# Patient Record
Sex: Female | Born: 1965 | Race: White | Hispanic: Yes | Smoking: Never smoker
Health system: Southern US, Community
[De-identification: ages and names within clinical notes are randomized; demographics above are authoritative.]

## PROBLEM LIST (undated history)

## (undated) ENCOUNTER — Emergency Department: Admission: EM | Payer: Medicare Other | Source: Home / Self Care

## (undated) DIAGNOSIS — M797 Fibromyalgia: Secondary | ICD-10-CM

## (undated) DIAGNOSIS — D649 Anemia, unspecified: Secondary | ICD-10-CM

## (undated) DIAGNOSIS — B019 Varicella without complication: Secondary | ICD-10-CM

## (undated) DIAGNOSIS — G250 Essential tremor: Secondary | ICD-10-CM

## (undated) DIAGNOSIS — F909 Attention-deficit hyperactivity disorder, unspecified type: Secondary | ICD-10-CM

## (undated) DIAGNOSIS — M810 Age-related osteoporosis without current pathological fracture: Secondary | ICD-10-CM

## (undated) DIAGNOSIS — G8929 Other chronic pain: Secondary | ICD-10-CM

## (undated) DIAGNOSIS — Z8719 Personal history of other diseases of the digestive system: Secondary | ICD-10-CM

## (undated) DIAGNOSIS — R51 Headache: Secondary | ICD-10-CM

## (undated) DIAGNOSIS — M899 Disorder of bone, unspecified: Secondary | ICD-10-CM

## (undated) DIAGNOSIS — M1990 Unspecified osteoarthritis, unspecified site: Secondary | ICD-10-CM

## (undated) DIAGNOSIS — N3010 Interstitial cystitis (chronic) without hematuria: Secondary | ICD-10-CM

## (undated) DIAGNOSIS — M549 Dorsalgia, unspecified: Secondary | ICD-10-CM

## (undated) DIAGNOSIS — M949 Disorder of cartilage, unspecified: Secondary | ICD-10-CM

## (undated) DIAGNOSIS — R519 Headache, unspecified: Secondary | ICD-10-CM

## (undated) DIAGNOSIS — M359 Systemic involvement of connective tissue, unspecified: Secondary | ICD-10-CM

## (undated) DIAGNOSIS — N8110 Cystocele, unspecified: Secondary | ICD-10-CM

## (undated) DIAGNOSIS — C50919 Malignant neoplasm of unspecified site of unspecified female breast: Secondary | ICD-10-CM

## (undated) DIAGNOSIS — G20 Parkinson's disease: Secondary | ICD-10-CM

## (undated) DIAGNOSIS — I340 Nonrheumatic mitral (valve) insufficiency: Secondary | ICD-10-CM

## (undated) DIAGNOSIS — M793 Panniculitis, unspecified: Secondary | ICD-10-CM

## (undated) DIAGNOSIS — N318 Other neuromuscular dysfunction of bladder: Secondary | ICD-10-CM

## (undated) DIAGNOSIS — M329 Systemic lupus erythematosus, unspecified: Secondary | ICD-10-CM

## (undated) DIAGNOSIS — H469 Unspecified optic neuritis: Secondary | ICD-10-CM

## (undated) DIAGNOSIS — F329 Major depressive disorder, single episode, unspecified: Secondary | ICD-10-CM

## (undated) DIAGNOSIS — E039 Hypothyroidism, unspecified: Secondary | ICD-10-CM

## (undated) DIAGNOSIS — G43909 Migraine, unspecified, not intractable, without status migrainosus: Secondary | ICD-10-CM

## (undated) DIAGNOSIS — R635 Abnormal weight gain: Secondary | ICD-10-CM

## (undated) DIAGNOSIS — M130 Polyarthritis, unspecified: Secondary | ICD-10-CM

## (undated) DIAGNOSIS — F411 Generalized anxiety disorder: Secondary | ICD-10-CM

## (undated) DIAGNOSIS — R350 Frequency of micturition: Secondary | ICD-10-CM

## (undated) DIAGNOSIS — C539 Malignant neoplasm of cervix uteri, unspecified: Secondary | ICD-10-CM

## (undated) DIAGNOSIS — H409 Unspecified glaucoma: Secondary | ICD-10-CM

## (undated) DIAGNOSIS — K219 Gastro-esophageal reflux disease without esophagitis: Secondary | ICD-10-CM

## (undated) DIAGNOSIS — Z87442 Personal history of urinary calculi: Secondary | ICD-10-CM

## (undated) DIAGNOSIS — IMO0002 Reserved for concepts with insufficient information to code with codable children: Secondary | ICD-10-CM

## (undated) DIAGNOSIS — F429 Obsessive-compulsive disorder, unspecified: Secondary | ICD-10-CM

## (undated) DIAGNOSIS — I776 Arteritis, unspecified: Secondary | ICD-10-CM

## (undated) DIAGNOSIS — G629 Polyneuropathy, unspecified: Secondary | ICD-10-CM

## (undated) DIAGNOSIS — Z780 Asymptomatic menopausal state: Secondary | ICD-10-CM

## (undated) DIAGNOSIS — J45909 Unspecified asthma, uncomplicated: Secondary | ICD-10-CM

## (undated) DIAGNOSIS — I7300 Raynaud's syndrome without gangrene: Secondary | ICD-10-CM

## (undated) DIAGNOSIS — M3500 Sicca syndrome, unspecified: Secondary | ICD-10-CM

## (undated) DIAGNOSIS — K2970 Gastritis, unspecified, without bleeding: Secondary | ICD-10-CM

## (undated) DIAGNOSIS — T7840XA Allergy, unspecified, initial encounter: Secondary | ICD-10-CM

## (undated) HISTORY — DX: Allergy, unspecified, initial encounter: T78.40XA

## (undated) HISTORY — PX: ADENOIDECTOMY: SUR15

## (undated) HISTORY — PX: FRACTURE SURGERY: SHX138

## (undated) HISTORY — DX: Panniculitis, unspecified: M79.3

## (undated) HISTORY — DX: Generalized anxiety disorder: F41.1

## (undated) HISTORY — PX: TONSILLECTOMY: SUR1361

## (undated) HISTORY — DX: Major depressive disorder, single episode, unspecified: F32.9

## (undated) HISTORY — DX: Reserved for concepts with insufficient information to code with codable children: IMO0002

## (undated) HISTORY — DX: Migraine, unspecified, not intractable, without status migrainosus: G43.909

## (undated) HISTORY — DX: Cystocele, unspecified: N81.10

## (undated) HISTORY — DX: Asymptomatic menopausal state: Z78.0

## (undated) HISTORY — DX: Unspecified optic neuritis: H46.9

## (undated) HISTORY — DX: Frequency of micturition: R35.0

## (undated) HISTORY — DX: Attention-deficit hyperactivity disorder, unspecified type: F90.9

## (undated) HISTORY — DX: Interstitial cystitis (chronic) without hematuria: N30.10

## (undated) HISTORY — DX: Hypothyroidism, unspecified: E03.9

## (undated) HISTORY — DX: Obsessive-compulsive disorder, unspecified: F42.9

## (undated) HISTORY — DX: Raynaud's syndrome without gangrene: I73.00

## (undated) HISTORY — DX: Unspecified osteoarthritis, unspecified site: M19.90

## (undated) HISTORY — DX: Unspecified glaucoma: H40.9

## (undated) HISTORY — PX: TONSILLECTOMY: SHX5217

## (undated) HISTORY — DX: Age-related osteoporosis without current pathological fracture: M81.0

## (undated) HISTORY — PX: WRIST SURGERY: SHX841

## (undated) HISTORY — DX: Polyarthritis, unspecified: M13.0

## (undated) HISTORY — PX: OTHER SURGICAL HISTORY: SHX169

## (undated) HISTORY — DX: Dorsalgia, unspecified: M54.9

## (undated) HISTORY — DX: Abnormal weight gain: R63.5

## (undated) HISTORY — PX: APPENDECTOMY: SHX54

## (undated) HISTORY — DX: Other chronic pain: G89.29

## (undated) HISTORY — PX: CHOLECYSTECTOMY: SHX55

## (undated) HISTORY — DX: Gastro-esophageal reflux disease without esophagitis: K21.9

## (undated) HISTORY — DX: Malignant neoplasm of unspecified site of unspecified female breast: C50.919

## (undated) HISTORY — DX: Arteritis, unspecified: I77.6

## (undated) HISTORY — DX: Malignant neoplasm of cervix uteri, unspecified: C53.9

## (undated) HISTORY — PX: JOINT REPLACEMENT: SHX530

## (undated) HISTORY — DX: Varicella without complication: B01.9

## (undated) HISTORY — PX: HAND SURGERY: SHX662

## (undated) HISTORY — DX: Disorder of cartilage, unspecified: M94.9

## (undated) HISTORY — DX: Systemic involvement of connective tissue, unspecified: M35.9

## (undated) HISTORY — DX: Personal history of urinary calculi: Z87.442

## (undated) HISTORY — DX: Disorder of bone, unspecified: M89.9

## (undated) HISTORY — PX: WRIST RECONSTRUCTION: SHX2675

## (undated) HISTORY — DX: Other neuromuscular dysfunction of bladder: N31.8

## (undated) HISTORY — DX: Nonrheumatic mitral (valve) insufficiency: I34.0

## (undated) HISTORY — DX: Sjogren syndrome, unspecified: M35.00

---
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
## 2003-12-01 HISTORY — PX: CERVICAL BIOPSY  W/ LOOP ELECTRODE EXCISION: SUR135

## 2004-11-30 DIAGNOSIS — IMO0002 Reserved for concepts with insufficient information to code with codable children: Secondary | ICD-10-CM

## 2004-11-30 DIAGNOSIS — M329 Systemic lupus erythematosus, unspecified: Secondary | ICD-10-CM

## 2004-11-30 HISTORY — DX: Reserved for concepts with insufficient information to code with codable children: IMO0002

## 2004-11-30 HISTORY — DX: Systemic lupus erythematosus, unspecified: M32.9

## 2006-11-28 ENCOUNTER — Inpatient Hospital Stay: Payer: Self-pay | Admitting: Internal Medicine

## 2007-01-04 ENCOUNTER — Ambulatory Visit: Payer: Self-pay | Admitting: Internal Medicine

## 2007-01-13 ENCOUNTER — Inpatient Hospital Stay: Payer: Self-pay | Admitting: Internal Medicine

## 2007-03-22 ENCOUNTER — Inpatient Hospital Stay (HOSPITAL_COMMUNITY): Admission: AD | Admit: 2007-03-22 | Discharge: 2007-03-27 | Payer: Self-pay | Admitting: Internal Medicine

## 2007-03-22 ENCOUNTER — Ambulatory Visit: Payer: Self-pay | Admitting: Internal Medicine

## 2007-04-07 ENCOUNTER — Ambulatory Visit: Payer: Self-pay | Admitting: Internal Medicine

## 2007-04-08 ENCOUNTER — Ambulatory Visit: Payer: Self-pay | Admitting: Internal Medicine

## 2007-04-18 ENCOUNTER — Encounter: Payer: Self-pay | Admitting: Internal Medicine

## 2007-04-26 ENCOUNTER — Ambulatory Visit: Payer: Self-pay | Admitting: Internal Medicine

## 2007-04-26 LAB — CONVERTED CEMR LAB
AST: 18 units/L (ref 0–37)
Albumin: 3.8 g/dL (ref 3.5–5.2)
Alkaline Phosphatase: 67 units/L (ref 39–117)
BUN: 15 mg/dL (ref 6–23)
Basophils Absolute: 0 10*3/uL (ref 0.0–0.1)
Chloride: 113 meq/L — ABNORMAL HIGH (ref 96–112)
Creatinine, Ser: 0.9 mg/dL (ref 0.4–1.2)
HCT: 38.2 % (ref 36.0–46.0)
MCHC: 33.9 g/dL (ref 30.0–36.0)
Monocytes Relative: 5.8 % (ref 3.0–11.0)
Neutrophils Relative %: 52.8 % (ref 43.0–77.0)
RBC: 3.94 M/uL (ref 3.87–5.11)
RDW: 15.6 % — ABNORMAL HIGH (ref 11.5–14.6)
Total Bilirubin: 0.7 mg/dL (ref 0.3–1.2)

## 2007-05-18 ENCOUNTER — Inpatient Hospital Stay (HOSPITAL_COMMUNITY): Admission: EM | Admit: 2007-05-18 | Discharge: 2007-05-22 | Payer: Self-pay | Admitting: Emergency Medicine

## 2007-05-19 ENCOUNTER — Ambulatory Visit: Payer: Self-pay | Admitting: Internal Medicine

## 2007-05-27 ENCOUNTER — Ambulatory Visit: Payer: Self-pay | Admitting: Internal Medicine

## 2007-06-06 ENCOUNTER — Encounter: Payer: Self-pay | Admitting: Internal Medicine

## 2007-06-06 DIAGNOSIS — G8929 Other chronic pain: Secondary | ICD-10-CM | POA: Insufficient documentation

## 2007-06-06 DIAGNOSIS — F411 Generalized anxiety disorder: Secondary | ICD-10-CM | POA: Insufficient documentation

## 2007-06-06 DIAGNOSIS — E039 Hypothyroidism, unspecified: Secondary | ICD-10-CM

## 2007-06-06 DIAGNOSIS — F329 Major depressive disorder, single episode, unspecified: Secondary | ICD-10-CM

## 2007-06-06 DIAGNOSIS — K219 Gastro-esophageal reflux disease without esophagitis: Secondary | ICD-10-CM | POA: Insufficient documentation

## 2007-06-06 DIAGNOSIS — F3289 Other specified depressive episodes: Secondary | ICD-10-CM

## 2007-06-06 DIAGNOSIS — F419 Anxiety disorder, unspecified: Secondary | ICD-10-CM | POA: Insufficient documentation

## 2007-06-06 DIAGNOSIS — F339 Major depressive disorder, recurrent, unspecified: Secondary | ICD-10-CM | POA: Insufficient documentation

## 2007-06-06 HISTORY — DX: Other chronic pain: G89.29

## 2007-06-06 HISTORY — DX: Gastro-esophageal reflux disease without esophagitis: K21.9

## 2007-06-06 HISTORY — DX: Major depressive disorder, single episode, unspecified: F32.9

## 2007-06-06 HISTORY — DX: Other specified depressive episodes: F32.89

## 2007-06-06 HISTORY — DX: Hypothyroidism, unspecified: E03.9

## 2007-06-06 HISTORY — DX: Generalized anxiety disorder: F41.1

## 2007-06-07 ENCOUNTER — Ambulatory Visit: Payer: Self-pay | Admitting: Internal Medicine

## 2007-06-07 LAB — CONVERTED CEMR LAB
AST: 42 units/L — ABNORMAL HIGH (ref 0–37)
Basophils Absolute: 0 10*3/uL (ref 0.0–0.1)
Bilirubin, Direct: 0.2 mg/dL (ref 0.0–0.3)
Creatinine, Ser: 0.9 mg/dL (ref 0.4–1.2)
Eosinophils Relative: 0 % (ref 0–5)
HCT: 39.8 % (ref 36.0–46.0)
Lymphocytes Relative: 7 % — ABNORMAL LOW (ref 12–46)
Neutro Abs: 5.3 10*3/uL (ref 1.7–7.7)
Platelets: 227 10*3/uL (ref 150–400)
RDW: 16.5 % — ABNORMAL HIGH (ref 11.5–14.0)
Total Bilirubin: 1 mg/dL (ref 0.3–1.2)

## 2007-06-13 ENCOUNTER — Inpatient Hospital Stay (HOSPITAL_COMMUNITY): Admission: EM | Admit: 2007-06-13 | Discharge: 2007-06-15 | Payer: Self-pay | Admitting: Emergency Medicine

## 2007-06-15 ENCOUNTER — Ambulatory Visit: Payer: Self-pay | Admitting: Internal Medicine

## 2007-06-23 ENCOUNTER — Encounter (INDEPENDENT_AMBULATORY_CARE_PROVIDER_SITE_OTHER): Payer: Self-pay

## 2007-06-24 ENCOUNTER — Ambulatory Visit: Payer: Self-pay | Admitting: Internal Medicine

## 2007-07-07 ENCOUNTER — Ambulatory Visit: Payer: Self-pay | Admitting: Family Medicine

## 2007-07-07 DIAGNOSIS — R350 Frequency of micturition: Secondary | ICD-10-CM | POA: Insufficient documentation

## 2007-07-07 HISTORY — DX: Frequency of micturition: R35.0

## 2007-07-07 LAB — CONVERTED CEMR LAB
Blood in Urine, dipstick: NEGATIVE
Glucose, Urine, Semiquant: NEGATIVE
Specific Gravity, Urine: 1.015
pH: 7

## 2007-07-22 ENCOUNTER — Ambulatory Visit: Payer: Self-pay | Admitting: Internal Medicine

## 2007-07-27 ENCOUNTER — Encounter: Payer: Self-pay | Admitting: Internal Medicine

## 2007-07-28 ENCOUNTER — Telehealth: Payer: Self-pay | Admitting: Internal Medicine

## 2007-08-09 ENCOUNTER — Ambulatory Visit: Payer: Self-pay | Admitting: Internal Medicine

## 2007-08-09 ENCOUNTER — Telehealth (INDEPENDENT_AMBULATORY_CARE_PROVIDER_SITE_OTHER): Payer: Self-pay | Admitting: *Deleted

## 2007-08-09 ENCOUNTER — Inpatient Hospital Stay (HOSPITAL_COMMUNITY): Admission: EM | Admit: 2007-08-09 | Discharge: 2007-08-12 | Payer: Self-pay | Admitting: Emergency Medicine

## 2007-08-19 ENCOUNTER — Ambulatory Visit: Payer: Self-pay | Admitting: Internal Medicine

## 2007-08-19 DIAGNOSIS — M130 Polyarthritis, unspecified: Secondary | ICD-10-CM

## 2007-08-19 HISTORY — DX: Polyarthritis, unspecified: M13.0

## 2007-08-26 ENCOUNTER — Telehealth: Payer: Self-pay | Admitting: Internal Medicine

## 2007-09-08 ENCOUNTER — Telehealth: Payer: Self-pay | Admitting: Internal Medicine

## 2007-09-09 ENCOUNTER — Ambulatory Visit: Payer: Self-pay | Admitting: Internal Medicine

## 2007-09-09 LAB — CONVERTED CEMR LAB
ALT: 29 units/L (ref 0–35)
AST: 21 units/L (ref 0–37)
Albumin: 3.5 g/dL (ref 3.5–5.2)
Alkaline Phosphatase: 49 units/L (ref 39–117)
Basophils Relative: 0.8 % (ref 0.0–1.0)
Hemoglobin: 11.9 g/dL — ABNORMAL LOW (ref 12.0–15.0)
Monocytes Absolute: 0.4 10*3/uL (ref 0.2–0.7)
Monocytes Relative: 9.5 % (ref 3.0–11.0)
Platelets: 186 10*3/uL (ref 150–400)
RBC: 3.61 M/uL — ABNORMAL LOW (ref 3.87–5.11)
RDW: 15 % — ABNORMAL HIGH (ref 11.5–14.6)
Total Bilirubin: 0.8 mg/dL (ref 0.3–1.2)

## 2007-09-13 ENCOUNTER — Ambulatory Visit: Payer: Self-pay | Admitting: Internal Medicine

## 2007-09-13 LAB — CONVERTED CEMR LAB
Complement C4, Body Fluid: 14 mg/dL — ABNORMAL LOW (ref 16–47)
Cyclic Citrullin Peptide Ab: 0.1 units (ref <7)

## 2007-09-16 LAB — CONVERTED CEMR LAB: Protein, Ur: 38 mg/24hr — ABNORMAL LOW (ref 50–100)

## 2007-09-20 ENCOUNTER — Ambulatory Visit: Payer: Self-pay | Admitting: Internal Medicine

## 2007-10-03 ENCOUNTER — Ambulatory Visit: Payer: Self-pay | Admitting: Internal Medicine

## 2007-11-08 ENCOUNTER — Ambulatory Visit: Payer: Self-pay | Admitting: Internal Medicine

## 2007-11-08 DIAGNOSIS — H469 Unspecified optic neuritis: Secondary | ICD-10-CM

## 2007-11-08 HISTORY — DX: Unspecified optic neuritis: H46.9

## 2007-11-08 LAB — CONVERTED CEMR LAB
ALT: 34 units/L (ref 0–35)
AST: 29 units/L (ref 0–37)
Albumin: 3.4 g/dL — ABNORMAL LOW (ref 3.5–5.2)
Basophils Relative: 0 % (ref 0.0–1.0)
Bilirubin, Direct: 0.2 mg/dL (ref 0.0–0.3)
HCT: 36.8 % (ref 36.0–46.0)
Hemoglobin: 12.4 g/dL (ref 12.0–15.0)
Monocytes Absolute: 0.4 10*3/uL (ref 0.2–0.7)
Neutrophils Relative %: 62.6 % (ref 43.0–77.0)
RBC: 3.66 M/uL — ABNORMAL LOW (ref 3.87–5.11)
RDW: 15.4 % — ABNORMAL HIGH (ref 11.5–14.6)
Total Bilirubin: 0.7 mg/dL (ref 0.3–1.2)
WBC: 5.3 10*3/uL (ref 4.5–10.5)

## 2007-11-09 ENCOUNTER — Encounter: Payer: Self-pay | Admitting: Internal Medicine

## 2007-11-09 LAB — CONVERTED CEMR LAB: C3 Complement: 70 mg/dL — ABNORMAL LOW (ref 88–201)

## 2007-12-20 ENCOUNTER — Ambulatory Visit: Payer: Self-pay | Admitting: Pain Medicine

## 2007-12-28 ENCOUNTER — Encounter: Payer: Self-pay | Admitting: Internal Medicine

## 2007-12-28 ENCOUNTER — Inpatient Hospital Stay (HOSPITAL_COMMUNITY): Admission: EM | Admit: 2007-12-28 | Discharge: 2007-12-31 | Payer: Self-pay | Admitting: Emergency Medicine

## 2007-12-28 ENCOUNTER — Ambulatory Visit: Payer: Self-pay | Admitting: Internal Medicine

## 2007-12-31 ENCOUNTER — Encounter: Payer: Self-pay | Admitting: Internal Medicine

## 2008-01-26 ENCOUNTER — Telehealth: Payer: Self-pay | Admitting: Internal Medicine

## 2008-01-27 ENCOUNTER — Encounter: Payer: Self-pay | Admitting: Internal Medicine

## 2008-01-31 ENCOUNTER — Encounter: Payer: Self-pay | Admitting: Internal Medicine

## 2008-02-03 ENCOUNTER — Encounter: Payer: Self-pay | Admitting: Internal Medicine

## 2008-02-27 ENCOUNTER — Ambulatory Visit: Payer: Self-pay | Admitting: Internal Medicine

## 2008-02-27 LAB — CONVERTED CEMR LAB
Albumin: 3.4 g/dL — ABNORMAL LOW (ref 3.5–5.2)
Alkaline Phosphatase: 51 units/L (ref 39–117)
Basophils Absolute: 0 10*3/uL (ref 0.0–0.1)
Basophils Relative: 0.3 % (ref 0.0–1.0)
Eosinophils Absolute: 0.2 10*3/uL (ref 0.0–0.7)
Eosinophils Relative: 3 % (ref 0.0–5.0)
HCT: 36.7 % (ref 36.0–46.0)
MCHC: 33 g/dL (ref 30.0–36.0)
MCV: 100 fL (ref 78.0–100.0)
Monocytes Absolute: 0.5 10*3/uL (ref 0.1–1.0)
Neutrophils Relative %: 72.9 % (ref 43.0–77.0)
RBC: 3.66 M/uL — ABNORMAL LOW (ref 3.87–5.11)
Total Protein: 5.7 g/dL — ABNORMAL LOW (ref 6.0–8.3)
WBC: 6.9 10*3/uL (ref 4.5–10.5)

## 2008-02-28 ENCOUNTER — Ambulatory Visit: Payer: Self-pay | Admitting: Obstetrics and Gynecology

## 2008-03-12 ENCOUNTER — Encounter: Payer: Self-pay | Admitting: Internal Medicine

## 2008-03-27 ENCOUNTER — Encounter: Payer: Self-pay | Admitting: Internal Medicine

## 2008-04-14 ENCOUNTER — Emergency Department (HOSPITAL_COMMUNITY): Admission: EM | Admit: 2008-04-14 | Discharge: 2008-04-14 | Payer: Self-pay | Admitting: Emergency Medicine

## 2008-05-24 ENCOUNTER — Ambulatory Visit: Payer: Self-pay | Admitting: Internal Medicine

## 2008-05-24 LAB — CONVERTED CEMR LAB
ALT: 23 units/L (ref 0–35)
Albumin: 3.9 g/dL (ref 3.5–5.2)
BUN: 22 mg/dL (ref 6–23)
Basophils Relative: 2.1 % — ABNORMAL HIGH (ref 0.0–1.0)
Bilirubin, Direct: 0.1 mg/dL (ref 0.0–0.3)
C3 Complement: 123 mg/dL (ref 88–201)
CO2: 26 meq/L (ref 19–32)
Calcium: 9 mg/dL (ref 8.4–10.5)
Creatinine, Ser: 0.9 mg/dL (ref 0.4–1.2)
Eosinophils Relative: 2 % (ref 0.0–5.0)
GFR calc Af Amer: 88 mL/min
Glucose, Bld: 69 mg/dL — ABNORMAL LOW (ref 70–99)
HCT: 39.9 % (ref 36.0–46.0)
Hemoglobin: 13.4 g/dL (ref 12.0–15.0)
Lymphocytes Relative: 29.3 % (ref 12.0–46.0)
Monocytes Absolute: 0.5 10*3/uL (ref 0.1–1.0)
Monocytes Relative: 7.9 % (ref 3.0–12.0)
Neutro Abs: 3.3 10*3/uL (ref 1.4–7.7)
RBC: 4.13 M/uL (ref 3.87–5.11)
Sodium: 140 meq/L (ref 135–145)
Total Protein: 6.5 g/dL (ref 6.0–8.3)
WBC: 5.7 10*3/uL (ref 4.5–10.5)

## 2008-06-04 ENCOUNTER — Telehealth: Payer: Self-pay | Admitting: Internal Medicine

## 2008-08-09 ENCOUNTER — Telehealth: Payer: Self-pay | Admitting: Internal Medicine

## 2008-08-10 ENCOUNTER — Ambulatory Visit: Payer: Self-pay | Admitting: Internal Medicine

## 2008-08-10 LAB — CONVERTED CEMR LAB
Basophils Absolute: 0 10*3/uL (ref 0.0–0.1)
Bilirubin, Direct: 0.1 mg/dL (ref 0.0–0.3)
Calcium: 8.6 mg/dL (ref 8.4–10.5)
GFR calc Af Amer: 101 mL/min
Hemoglobin: 11.9 g/dL — ABNORMAL LOW (ref 12.0–15.0)
Lymphocytes Relative: 40.3 % (ref 12.0–46.0)
MCHC: 33 g/dL (ref 30.0–36.0)
Monocytes Absolute: 0.4 10*3/uL (ref 0.1–1.0)
Neutro Abs: 2.6 10*3/uL (ref 1.4–7.7)
Platelets: 170 10*3/uL (ref 150–400)
Potassium: 3.6 meq/L (ref 3.5–5.1)
RDW: 14.7 % — ABNORMAL HIGH (ref 11.5–14.6)
Sodium: 142 meq/L (ref 135–145)
Total Bilirubin: 0.7 mg/dL (ref 0.3–1.2)

## 2008-09-12 ENCOUNTER — Ambulatory Visit: Payer: Self-pay | Admitting: Internal Medicine

## 2008-09-26 ENCOUNTER — Ambulatory Visit: Payer: Self-pay | Admitting: Internal Medicine

## 2008-09-26 LAB — CONVERTED CEMR LAB
Basophils Absolute: 0 10*3/uL (ref 0.0–0.1)
Basophils Relative: 0.2 % (ref 0.0–3.0)
CRP, High Sensitivity: 3 (ref 0.00–5.00)
Eosinophils Absolute: 0.1 10*3/uL (ref 0.0–0.7)
HCT: 36.3 % (ref 36.0–46.0)
Hemoglobin: 12.3 g/dL (ref 12.0–15.0)
MCHC: 33.9 g/dL (ref 30.0–36.0)
MCV: 94.1 fL (ref 78.0–100.0)
Monocytes Absolute: 0.4 10*3/uL (ref 0.1–1.0)
Neutro Abs: 2.9 10*3/uL (ref 1.4–7.7)
RBC: 3.85 M/uL — ABNORMAL LOW (ref 3.87–5.11)

## 2008-10-01 ENCOUNTER — Ambulatory Visit: Payer: Self-pay | Admitting: Internal Medicine

## 2008-10-01 DIAGNOSIS — M79609 Pain in unspecified limb: Secondary | ICD-10-CM | POA: Insufficient documentation

## 2008-10-30 ENCOUNTER — Ambulatory Visit: Payer: Self-pay | Admitting: Internal Medicine

## 2008-11-01 LAB — CONVERTED CEMR LAB
Creatinine, Ser: 0.9 mg/dL (ref 0.4–1.2)
HCT: 35.5 % — ABNORMAL LOW (ref 36.0–46.0)
Hemoglobin: 12 g/dL (ref 12.0–15.0)
MCHC: 33.8 g/dL (ref 30.0–36.0)
MCV: 92.4 fL (ref 78.0–100.0)
Monocytes Absolute: 0.7 10*3/uL (ref 0.1–1.0)
Monocytes Relative: 9.4 % (ref 3.0–12.0)
Neutro Abs: 3.9 10*3/uL (ref 1.4–7.7)
RDW: 14.6 % (ref 11.5–14.6)

## 2008-12-07 ENCOUNTER — Ambulatory Visit: Payer: Self-pay | Admitting: Internal Medicine

## 2008-12-10 LAB — CONVERTED CEMR LAB
Albumin: 3.9 g/dL (ref 3.5–5.2)
BUN: 22 mg/dL (ref 6–23)
Basophils Relative: 0.1 % (ref 0.0–3.0)
CRP, High Sensitivity: 1 (ref 0.00–5.00)
Calcium: 8.8 mg/dL (ref 8.4–10.5)
Creatinine, Ser: 0.8 mg/dL (ref 0.4–1.2)
Eosinophils Absolute: 0.1 10*3/uL (ref 0.0–0.7)
Eosinophils Relative: 0.7 % (ref 0.0–5.0)
GFR calc Af Amer: 100 mL/min
GFR calc non Af Amer: 83 mL/min
HCT: 35 % — ABNORMAL LOW (ref 36.0–46.0)
MCV: 90.9 fL (ref 78.0–100.0)
Monocytes Absolute: 0.3 10*3/uL (ref 0.1–1.0)
Platelets: 211 10*3/uL (ref 150–400)
WBC: 8.9 10*3/uL (ref 4.5–10.5)

## 2009-01-17 ENCOUNTER — Ambulatory Visit: Payer: Self-pay | Admitting: Internal Medicine

## 2009-01-17 ENCOUNTER — Inpatient Hospital Stay (HOSPITAL_COMMUNITY): Admission: EM | Admit: 2009-01-17 | Discharge: 2009-01-20 | Payer: Self-pay | Admitting: Emergency Medicine

## 2009-02-22 ENCOUNTER — Ambulatory Visit: Payer: Self-pay | Admitting: Internal Medicine

## 2009-02-22 DIAGNOSIS — R635 Abnormal weight gain: Secondary | ICD-10-CM

## 2009-02-22 DIAGNOSIS — M549 Dorsalgia, unspecified: Secondary | ICD-10-CM

## 2009-02-22 HISTORY — DX: Abnormal weight gain: R63.5

## 2009-02-22 HISTORY — DX: Dorsalgia, unspecified: M54.9

## 2009-02-24 ENCOUNTER — Emergency Department: Payer: Self-pay | Admitting: Emergency Medicine

## 2009-03-04 ENCOUNTER — Ambulatory Visit: Payer: Self-pay | Admitting: Obstetrics and Gynecology

## 2009-03-12 ENCOUNTER — Ambulatory Visit: Payer: Self-pay | Admitting: Unknown Physician Specialty

## 2009-03-19 ENCOUNTER — Emergency Department: Payer: Self-pay | Admitting: Emergency Medicine

## 2009-03-26 ENCOUNTER — Ambulatory Visit: Payer: Self-pay | Admitting: Internal Medicine

## 2009-06-18 ENCOUNTER — Encounter: Admission: RE | Admit: 2009-06-18 | Discharge: 2009-06-18 | Payer: Self-pay | Admitting: Orthopedic Surgery

## 2009-06-25 ENCOUNTER — Encounter: Payer: Self-pay | Admitting: Internal Medicine

## 2009-06-25 ENCOUNTER — Ambulatory Visit: Payer: Self-pay | Admitting: Obstetrics and Gynecology

## 2009-06-26 ENCOUNTER — Encounter: Payer: Self-pay | Admitting: Internal Medicine

## 2009-06-26 ENCOUNTER — Emergency Department: Payer: Self-pay | Admitting: Internal Medicine

## 2009-06-27 ENCOUNTER — Ambulatory Visit: Payer: Self-pay | Admitting: Internal Medicine

## 2009-06-27 ENCOUNTER — Encounter: Payer: Self-pay | Admitting: Internal Medicine

## 2009-06-30 HISTORY — PX: REVISION TOTAL HIP ARTHROPLASTY: SHX766

## 2009-07-01 ENCOUNTER — Inpatient Hospital Stay (HOSPITAL_COMMUNITY): Admission: EM | Admit: 2009-07-01 | Discharge: 2009-07-08 | Payer: Self-pay | Admitting: Orthopedic Surgery

## 2009-07-02 ENCOUNTER — Encounter: Payer: Self-pay | Admitting: Internal Medicine

## 2009-07-05 ENCOUNTER — Ambulatory Visit: Payer: Self-pay | Admitting: Physical Medicine & Rehabilitation

## 2009-07-08 ENCOUNTER — Encounter: Payer: Self-pay | Admitting: Internal Medicine

## 2009-08-06 ENCOUNTER — Telehealth: Payer: Self-pay | Admitting: Internal Medicine

## 2009-08-14 ENCOUNTER — Ambulatory Visit: Payer: Self-pay | Admitting: Internal Medicine

## 2009-08-14 ENCOUNTER — Encounter (INDEPENDENT_AMBULATORY_CARE_PROVIDER_SITE_OTHER): Payer: Self-pay

## 2009-08-15 LAB — CONVERTED CEMR LAB
Albumin: 3.3 g/dL — ABNORMAL LOW (ref 3.5–5.2)
Alkaline Phosphatase: 91 units/L (ref 39–117)
Basophils Relative: 0.7 % (ref 0.0–3.0)
Creatinine, Ser: 0.9 mg/dL (ref 0.4–1.2)
Eosinophils Absolute: 0.2 10*3/uL (ref 0.0–0.7)
Lymphocytes Relative: 20.3 % (ref 12.0–46.0)
Lymphs Abs: 1.7 10*3/uL (ref 0.7–4.0)
Neutro Abs: 5.9 10*3/uL (ref 1.4–7.7)
Platelets: 248 10*3/uL (ref 150.0–400.0)
Sed Rate: 10 mm/hr (ref 0–22)
Total Protein: 5.8 g/dL — ABNORMAL LOW (ref 6.0–8.3)
WBC: 8.5 10*3/uL (ref 4.5–10.5)

## 2009-08-26 ENCOUNTER — Encounter: Payer: Self-pay | Admitting: Internal Medicine

## 2009-08-26 ENCOUNTER — Ambulatory Visit: Payer: Self-pay | Admitting: Family Medicine

## 2009-09-26 ENCOUNTER — Encounter: Payer: Self-pay | Admitting: Internal Medicine

## 2009-10-14 ENCOUNTER — Ambulatory Visit: Payer: Self-pay | Admitting: Internal Medicine

## 2009-10-14 DIAGNOSIS — N318 Other neuromuscular dysfunction of bladder: Secondary | ICD-10-CM

## 2009-10-14 DIAGNOSIS — M899 Disorder of bone, unspecified: Secondary | ICD-10-CM | POA: Insufficient documentation

## 2009-10-14 DIAGNOSIS — M949 Disorder of cartilage, unspecified: Secondary | ICD-10-CM

## 2009-10-14 DIAGNOSIS — N3281 Overactive bladder: Secondary | ICD-10-CM | POA: Insufficient documentation

## 2009-10-14 HISTORY — DX: Other neuromuscular dysfunction of bladder: N31.8

## 2009-10-14 HISTORY — DX: Disorder of bone, unspecified: M89.9

## 2009-10-14 LAB — CONVERTED CEMR LAB
Bilirubin Urine: NEGATIVE
Nitrite: NEGATIVE
Specific Gravity, Urine: 1.015
Urobilinogen, UA: 0.2

## 2009-10-15 LAB — CONVERTED CEMR LAB
ALT: 21 units/L (ref 0–35)
BUN: 21 mg/dL (ref 6–23)
Basophils Absolute: 0 10*3/uL (ref 0.0–0.1)
Calcium: 8.7 mg/dL (ref 8.4–10.5)
GFR calc non Af Amer: 82.45 mL/min (ref >60)
Glucose, Bld: 103 mg/dL — ABNORMAL HIGH (ref 70–99)
Lymphocytes Relative: 8.7 % — ABNORMAL LOW (ref 12.0–46.0)
Monocytes Relative: 3.5 % (ref 3.0–12.0)
Platelets: 204 10*3/uL (ref 150.0–400.0)
RDW: 18.9 % — ABNORMAL HIGH (ref 11.5–14.6)
Sed Rate: 7 mm/hr (ref 0–22)
Sodium: 144 meq/L (ref 135–145)

## 2009-10-16 ENCOUNTER — Telehealth: Payer: Self-pay | Admitting: Internal Medicine

## 2009-10-25 ENCOUNTER — Emergency Department: Payer: Self-pay | Admitting: Emergency Medicine

## 2009-11-20 ENCOUNTER — Ambulatory Visit: Payer: Self-pay | Admitting: Orthopedic Surgery

## 2009-12-13 ENCOUNTER — Ambulatory Visit: Payer: Self-pay | Admitting: Internal Medicine

## 2009-12-13 LAB — CONVERTED CEMR LAB
ALT: 19 units/L (ref 0–35)
BUN: 23 mg/dL (ref 6–23)
Basophils Absolute: 0 10*3/uL (ref 0.0–0.1)
Hemoglobin: 13.4 g/dL (ref 12.0–15.0)
Lymphocytes Relative: 15.4 % (ref 12.0–46.0)
Monocytes Relative: 5.1 % (ref 3.0–12.0)
Neutro Abs: 8.8 10*3/uL — ABNORMAL HIGH (ref 1.4–7.7)
Potassium: 3.9 meq/L (ref 3.5–5.3)
RBC: 4.23 M/uL (ref 3.87–5.11)
RDW: 16.4 % — ABNORMAL HIGH (ref 11.5–14.6)
Sed Rate: 10 mm/hr (ref 0–22)
Sodium: 144 meq/L (ref 135–145)

## 2009-12-21 ENCOUNTER — Observation Stay (HOSPITAL_COMMUNITY): Admission: EM | Admit: 2009-12-21 | Discharge: 2009-12-23 | Payer: Self-pay | Admitting: Emergency Medicine

## 2009-12-30 ENCOUNTER — Ambulatory Visit: Payer: Self-pay | Admitting: Internal Medicine

## 2009-12-30 DIAGNOSIS — J069 Acute upper respiratory infection, unspecified: Secondary | ICD-10-CM | POA: Insufficient documentation

## 2010-01-08 ENCOUNTER — Telehealth: Payer: Self-pay | Admitting: Internal Medicine

## 2010-01-31 ENCOUNTER — Ambulatory Visit: Payer: Self-pay | Admitting: Internal Medicine

## 2010-02-05 ENCOUNTER — Ambulatory Visit: Payer: Self-pay | Admitting: Internal Medicine

## 2010-02-05 LAB — CONVERTED CEMR LAB
BUN: 25 mg/dL — ABNORMAL HIGH (ref 6–23)
Basophils Absolute: 0 10*3/uL (ref 0.0–0.1)
Basophils Relative: 0.4 % (ref 0.0–3.0)
CO2: 28 meq/L (ref 19–32)
Calcium: 9.2 mg/dL (ref 8.4–10.5)
Creatinine, Ser: 0.9 mg/dL (ref 0.4–1.2)
Eosinophils Relative: 1.4 % (ref 0.0–5.0)
Glucose, Bld: 71 mg/dL (ref 70–99)
HCT: 41 % (ref 36.0–46.0)
Hemoglobin: 13.6 g/dL (ref 12.0–15.0)
Lymphs Abs: 1.8 10*3/uL (ref 0.7–4.0)
Monocytes Relative: 5.6 % (ref 3.0–12.0)
Neutro Abs: 5.7 10*3/uL (ref 1.4–7.7)
RDW: 15.5 % — ABNORMAL HIGH (ref 11.5–14.6)

## 2010-03-06 ENCOUNTER — Ambulatory Visit: Payer: Self-pay | Admitting: Obstetrics and Gynecology

## 2010-03-14 ENCOUNTER — Ambulatory Visit: Payer: Self-pay | Admitting: Internal Medicine

## 2010-03-14 DIAGNOSIS — K140 Glossitis: Secondary | ICD-10-CM | POA: Insufficient documentation

## 2010-03-14 LAB — CONVERTED CEMR LAB
Albumin: 3.8 g/dL (ref 3.5–5.2)
Alkaline Phosphatase: 66 units/L (ref 39–117)
Basophils Absolute: 0 10*3/uL (ref 0.0–0.1)
Bilirubin, Direct: 0 mg/dL (ref 0.0–0.3)
Calcium: 9.1 mg/dL (ref 8.4–10.5)
Creatinine, Ser: 0.8 mg/dL (ref 0.4–1.2)
GFR calc non Af Amer: 82.3 mL/min (ref >60)
HCT: 40.6 % (ref 36.0–46.0)
Lymphs Abs: 1.4 10*3/uL (ref 0.7–4.0)
MCHC: 33.9 g/dL (ref 30.0–36.0)
MCV: 96.9 fL (ref 78.0–100.0)
Monocytes Absolute: 0.6 10*3/uL (ref 0.1–1.0)
Platelets: 247 10*3/uL (ref 150.0–400.0)
RDW: 15.1 % — ABNORMAL HIGH (ref 11.5–14.6)
Sodium: 142 meq/L (ref 135–145)

## 2010-03-20 ENCOUNTER — Telehealth: Payer: Self-pay | Admitting: Internal Medicine

## 2010-05-15 ENCOUNTER — Telehealth: Payer: Self-pay | Admitting: Internal Medicine

## 2010-06-03 ENCOUNTER — Telehealth: Payer: Self-pay | Admitting: Internal Medicine

## 2010-06-19 ENCOUNTER — Ambulatory Visit: Payer: Self-pay | Admitting: Internal Medicine

## 2010-06-19 LAB — CONVERTED CEMR LAB
ALT: 26 units/L (ref 0–35)
AST: 19 units/L (ref 0–37)
Basophils Relative: 0.2 % (ref 0.0–3.0)
CRP, High Sensitivity: 16.62 — ABNORMAL HIGH (ref 0.00–5.00)
Eosinophils Absolute: 0.1 10*3/uL (ref 0.0–0.7)
Eosinophils Relative: 1.8 % (ref 0.0–5.0)
Hemoglobin: 13.2 g/dL (ref 12.0–15.0)
Lymphocytes Relative: 23.3 % (ref 12.0–46.0)
MCHC: 34 g/dL (ref 30.0–36.0)
Neutro Abs: 5.6 10*3/uL (ref 1.4–7.7)
RBC: 4 M/uL (ref 3.87–5.11)
WBC: 8 10*3/uL (ref 4.5–10.5)

## 2010-07-08 ENCOUNTER — Ambulatory Visit: Payer: Self-pay | Admitting: Family Medicine

## 2010-07-08 LAB — CONVERTED CEMR LAB
ALT: 18 units/L (ref 0–35)
Basophils Relative: 0.3 % (ref 0.0–3.0)
Bilirubin Urine: NEGATIVE
CRP, High Sensitivity: 8.42 — ABNORMAL HIGH (ref 0.00–5.00)
Creatinine, Ser: 0.8 mg/dL (ref 0.4–1.2)
Eosinophils Relative: 2.6 % (ref 0.0–5.0)
Glucose, Urine, Semiquant: NEGATIVE
Ketones, urine, test strip: NEGATIVE
Lymphocytes Relative: 26.5 % (ref 12.0–46.0)
Monocytes Absolute: 0.4 10*3/uL (ref 0.1–1.0)
Monocytes Relative: 6.4 % (ref 3.0–12.0)
Neutrophils Relative %: 64.2 % (ref 43.0–77.0)
Platelets: 214 10*3/uL (ref 150.0–400.0)
RBC: 3.95 M/uL (ref 3.87–5.11)
Specific Gravity, Urine: 1.02
Urobilinogen, UA: 0.2
WBC: 7 10*3/uL (ref 4.5–10.5)
pH: 5

## 2010-07-15 ENCOUNTER — Encounter: Payer: Self-pay | Admitting: Internal Medicine

## 2010-08-06 ENCOUNTER — Ambulatory Visit: Payer: Self-pay | Admitting: Internal Medicine

## 2010-08-06 LAB — CONVERTED CEMR LAB
ALT: 19 units/L (ref 0–35)
Basophils Relative: 0.3 % (ref 0.0–3.0)
Chloride: 110 meq/L (ref 96–112)
Eosinophils Absolute: 0.2 10*3/uL (ref 0.0–0.7)
Eosinophils Relative: 3.5 % (ref 0.0–5.0)
GFR calc non Af Amer: 89.88 mL/min (ref >60)
Lymphocytes Relative: 32.6 % (ref 12.0–46.0)
MCV: 97.8 fL (ref 78.0–100.0)
Monocytes Absolute: 0.5 10*3/uL (ref 0.1–1.0)
Neutrophils Relative %: 56 % (ref 43.0–77.0)
Platelets: 210 10*3/uL (ref 150.0–400.0)
Potassium: 4 meq/L (ref 3.5–5.1)
RBC: 3.91 M/uL (ref 3.87–5.11)
Sodium: 142 meq/L (ref 135–145)
WBC: 6.3 10*3/uL (ref 4.5–10.5)

## 2010-08-13 ENCOUNTER — Telehealth: Payer: Self-pay | Admitting: Internal Medicine

## 2010-08-20 ENCOUNTER — Ambulatory Visit: Payer: Self-pay | Admitting: Internal Medicine

## 2010-08-20 LAB — CONVERTED CEMR LAB
ALT: 20 units/L (ref 0–35)
BUN: 25 mg/dL — ABNORMAL HIGH (ref 6–23)
Basophils Relative: 0.4 % (ref 0.0–3.0)
CO2: 23 meq/L (ref 19–32)
Chloride: 110 meq/L (ref 96–112)
Eosinophils Relative: 3.4 % (ref 0.0–5.0)
Glucose, Bld: 72 mg/dL (ref 70–99)
Hemoglobin: 13.3 g/dL (ref 12.0–15.0)
Lymphocytes Relative: 32.8 % (ref 12.0–46.0)
Monocytes Relative: 6.3 % (ref 3.0–12.0)
Neutro Abs: 3.5 10*3/uL (ref 1.4–7.7)
Neutrophils Relative %: 57.1 % (ref 43.0–77.0)
Potassium: 3.7 meq/L (ref 3.5–5.1)
RBC: 4.05 M/uL (ref 3.87–5.11)
WBC: 6.1 10*3/uL (ref 4.5–10.5)

## 2010-08-21 ENCOUNTER — Emergency Department (HOSPITAL_COMMUNITY): Admission: EM | Admit: 2010-08-21 | Discharge: 2010-08-22 | Payer: Self-pay | Admitting: Emergency Medicine

## 2010-09-03 ENCOUNTER — Ambulatory Visit: Payer: Self-pay | Admitting: Internal Medicine

## 2010-09-03 LAB — CONVERTED CEMR LAB
ALT: 20 units/L (ref 0–35)
BUN: 24 mg/dL — ABNORMAL HIGH (ref 6–23)
Basophils Relative: 0.3 % (ref 0.0–3.0)
Calcium: 9.2 mg/dL (ref 8.4–10.5)
Chloride: 107 meq/L (ref 96–112)
Creatinine, Ser: 1 mg/dL (ref 0.4–1.2)
Eosinophils Absolute: 0.2 10*3/uL (ref 0.0–0.7)
HCT: 40.7 % (ref 36.0–46.0)
Hemoglobin: 13.7 g/dL (ref 12.0–15.0)
Lymphocytes Relative: 25.4 % (ref 12.0–46.0)
Lymphs Abs: 2.1 10*3/uL (ref 0.7–4.0)
MCHC: 33.6 g/dL (ref 30.0–36.0)
MCV: 98.3 fL (ref 78.0–100.0)
Neutro Abs: 5.4 10*3/uL (ref 1.4–7.7)
RBC: 4.14 M/uL (ref 3.87–5.11)

## 2010-09-08 ENCOUNTER — Telehealth: Payer: Self-pay | Admitting: Internal Medicine

## 2010-09-11 ENCOUNTER — Ambulatory Visit: Payer: Self-pay | Admitting: Family Medicine

## 2010-09-11 ENCOUNTER — Telehealth: Payer: Self-pay | Admitting: Internal Medicine

## 2010-09-11 DIAGNOSIS — N3010 Interstitial cystitis (chronic) without hematuria: Secondary | ICD-10-CM

## 2010-09-11 HISTORY — DX: Interstitial cystitis (chronic) without hematuria: N30.10

## 2010-09-11 LAB — CONVERTED CEMR LAB
Blood in Urine, dipstick: NEGATIVE
Ketones, urine, test strip: NEGATIVE
Nitrite: NEGATIVE
Urobilinogen, UA: 0.2

## 2010-09-24 ENCOUNTER — Encounter: Payer: Self-pay | Admitting: Internal Medicine

## 2010-11-05 ENCOUNTER — Ambulatory Visit: Payer: Self-pay | Admitting: Internal Medicine

## 2010-11-05 ENCOUNTER — Encounter: Payer: Self-pay | Admitting: Internal Medicine

## 2010-11-05 DIAGNOSIS — Z87442 Personal history of urinary calculi: Secondary | ICD-10-CM

## 2010-11-05 HISTORY — DX: Personal history of urinary calculi: Z87.442

## 2010-11-06 LAB — CONVERTED CEMR LAB
Basophils Relative: 0.4 % (ref 0.0–3.0)
Eosinophils Absolute: 0.2 10*3/uL (ref 0.0–0.7)
Hemoglobin: 13 g/dL (ref 12.0–15.0)
Lymphs Abs: 2.2 10*3/uL (ref 0.7–4.0)
MCHC: 34 g/dL (ref 30.0–36.0)
MCV: 97.6 fL (ref 78.0–100.0)
Monocytes Absolute: 0.5 10*3/uL (ref 0.1–1.0)
Neutro Abs: 4.3 10*3/uL (ref 1.4–7.7)
RBC: 3.93 M/uL (ref 3.87–5.11)

## 2010-11-20 ENCOUNTER — Telehealth: Payer: Self-pay | Admitting: Internal Medicine

## 2010-12-22 ENCOUNTER — Encounter: Payer: Self-pay | Admitting: Orthopedic Surgery

## 2010-12-30 NOTE — Progress Notes (Signed)
Summary: referral to urologist  Phone Note Call from Patient Call back at [phone]   Caller: Patient Call For: [name] Savers  MD Summary of Call: pt saw dr todd today dx with interstitial cystitis. Pt has ov with [facility] urologist dr Evelene Croon on 09-18-2010. please fax all pertinent  office note to [phone] their phone [phone]. Can we do referral? if so see order to [facility] Initial call taken by: Heron Sabins,  September 11, 2010 2:43 PM  Follow-up for Phone Call        ok Follow-up by: [name] Savers  MD,  September 11, 2010 5:13 PM  New Problems: INTERSTITIAL CYSTITIS (ICD-595.1)   New Problems: INTERSTITIAL CYSTITIS (ICD-595.1)

## 2010-12-30 NOTE — Progress Notes (Signed)
Summary: med request  Phone Note Call from Patient   Caller: Patient Call For: Gordy Savers  MD Summary of Call: Pt would like the Vesicare 10 mg. one by mouth daily.  Does not want the Enablex, and wants name brand Vesicar. [phone]  [facility]) Initial call taken by: Lynann Beaver CMA,  August 13, 2010 9:30 AM  Follow-up for Phone Call        10 mg  #90  RF 6 Follow-up by: Gordy Savers  MD,  August 13, 2010 10:09 AM    New/Updated Medications: VESICARE 10 MG TABS (SOLIFENACIN SUCCINATE) one by mouth daily Prescriptions: VESICARE 10 MG TABS (SOLIFENACIN SUCCINATE) one by mouth daily  #90 x 6   Entered by:   Lynann Beaver CMA   Authorized by:   Gordy Savers  MD   Signed by:   Lynann Beaver CMA on 08/13/2010   Method used:   Electronically to        [facility]       [address]       [place], [place]  [address]       Ph: [phone]       Fax: [phone]   RxID:   [id number]

## 2010-12-30 NOTE — Assessment & Plan Note (Signed)
Summary: UTI//SLM   Vital Signs:  Patient profile:   45 year old female Weight:      213 pounds O2 Sat:      97 % Temp:     98.3 degrees F oral Pulse rate:   86 / minute Pulse rhythm:   regular Resp:     12 per minute BP sitting:   100 / 88  Vitals Entered By: Lynann Beaver CMA (September 11, 2010 12:08 PM) CC: ? UTI Is Patient Diabetic? No Pain Assessment Patient in pain? yes        CC:  ? UTI.  History of Present Illness: [name] [name] is a 45 year old female patient of Dr. Kirtland Bouchard. comes in with a 3 day history of frequency and dysuria.  She has a history of overactive bladder, and she is on desiccated 10 mg daily.  She is also on numerous other medications.  She's had no fever, chills, nausea, vomiting, diarrhea.  Her last urinary tract infection was over two years ago.  Current Medications (verified): 1)  Percocet 10-650 Mg  Tabs (Oxycodone-Acetaminophen) .... As Needed 2)  Levothyroxine Sodium 25 Mcg  Tabs (Levothyroxine Sodium) .Marland Kitchen.. 1 Once Daily 3)  Protonix 40 Mg  Tbec (Pantoprazole Sodium) .... Take 1 Tablet By Mouth Two Times A Day 4)  Topamax 100 Mg  Tabs (Topiramate) .Marland Kitchen.. 1 Two Times A Day 5)  Proventil Hfa 108 (90 Base) Mcg/act  Aers (Albuterol Sulfate) .... 2 Puffs Q4h As Needed For Wheeze 6)  Lorazepam 1 Mg  Tabs (Lorazepam) .Marland Kitchen.. 1 By Mouth in Am and At Noon 2 By Mouth Qhs 7)  Diclofenac Sodium 75 Mg  Tbec (Diclofenac Sodium) .Marland Kitchen.. 1 Two Times A Day 8)  Cymbalta 60 Mg  Cpep (Duloxetine Hcl) .Marland Kitchen.. 1 Two Times A Day 9)  Fentanyl 25 Mcg/hr  Pt72 (Fentanyl) .... One Every 3 Days 10)  Valtrex 500 Mg  Tabs (Valacyclovir Hcl) .Marland Kitchen.. 1 Once Daily 11)  Metanx 2.8-25-2 Mg  Tabs (L-Methylfolate-B6-B12) .Marland Kitchen.. 1 Once Daily 12)  Methotrexate Sodium 25 Mg/ml  Soln (Methotrexate Sodium) .... .09  Ml Q Week Subcutaneously 13)  Ventolin Hfa 108 (90 Base) Mcg/act  Aers (Albuterol Sulfate) .... 2 Puffs Every 4 Hours As Needed For Wheezing 14)  Topamax 50 Mg Tabs (Topiramate) .... One Daily At  Bedtime 15)  Fentanyl 50 Mcg/hr Pt72 (Fentanyl) .... One Every 3 Days 16)  Abilify 10 Mg Tabs (Aripiprazole) .Marland Kitchen.. 1 Once Daily 17)  Ultram 50 Mg Tabs (Tramadol Hcl) .Marland Kitchen.. 1 Q6h As Needed 18)  Forteo 600 Mcg/2.67ml Soln (Teriparatide (Recombinant)) .... 20 Micrograms Subcutaneously Qam 19)  Iron 325 (65 Fe) Mg Tabs (Ferrous Sulfate) .Marland Kitchen.. 1 Two Times A Day 20)  Aspir-Low 81 Mg Tbec (Aspirin) .Marland Kitchen.. 1 Once Daily 21)  Vitamin D (Ergocalciferol) 50000 Unit Caps (Ergocalciferol) .Marland Kitchen.. 1 Q Week 22)  Benzonatate 100 Mg Caps (Benzonatate) .... One Every 8 Hours As Needed For Cough 23)  Prednisone 5 Mg Tabs (Prednisone) .... Two Every Morning 24)  Silenor 6 Mg Tabs (Doxepin Hcl) .... At Bedtime 25)  Methocarbamol 500 Mg Tabs (Methocarbamol) .... Take 1 Tab By Mouth Three Times A Day 26)  Imuran 50 Mg Tabs (Azathioprine) .... One Daily At Bedtime 27)  Vesicare 10 Mg Tabs (Solifenacin Succinate) .... One By Mouth Daily  Allergies (verified): 1)  ! Penicillin G Potassium (Penicillin G Potassium) 2)  ! Biaxin (Clarithromycin) 3)  ! Phenergan 4)  ! Dhea (Nutritional Supplements)  Past History:  Past medical, surgical,  family and social histories (including risk factors) reviewed for relevance to current acute and chronic problems.  Past Medical History: Reviewed history from 08/06/2010 and no changes required. Anxiety Depression GERD Hypothyroidism Chronic pain Polyarthritis/ possible lupus variant Raynaud's optic neuritis overactive bladder  Past Surgical History: Reviewed history from 06/06/2007 and no changes required. Appendectomy Tonsillectomy  Family History: Reviewed history from 10/03/2007 and no changes required. father's health unknown mother at 68, is in good health one sister with asthma  Social History: Reviewed history from 10/03/2007 and no changes required. 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   Problems:  Medical Problems Added: 1)  Dx of Frequency, Urinary  (ICD-788.41)  Impression & Recommendations:  Problem # 1:  FREQUENCY, URINARY (ICD-788.41) Assessment New  Her updated medication list for this problem includes:    Vesicare 10 Mg Tabs (Solifenacin succinate) ..... One by mouth daily  Complete Medication List: 1)  Percocet 10-650 Mg Tabs (Oxycodone-acetaminophen) .... As needed 2)  Levothyroxine Sodium 25 Mcg Tabs (Levothyroxine sodium) .Marland Kitchen.. 1 once daily 3)  Protonix 40 Mg Tbec (Pantoprazole sodium) .... Take 1 tablet by mouth two times a day 4)  Topamax 100 Mg Tabs (Topiramate) .Marland Kitchen.. 1 two times a day 5)  Proventil Hfa 108 (90 Base) Mcg/act Aers (Albuterol sulfate) .... 2 puffs q4h as needed for wheeze 6)  Lorazepam 1 Mg Tabs (Lorazepam) .Marland Kitchen.. 1 by mouth in am and at noon 2 by mouth qhs 7)  Diclofenac Sodium 75 Mg Tbec (Diclofenac sodium) .Marland Kitchen.. 1 two times a day 8)  Cymbalta 60 Mg Cpep (Duloxetine hcl) .Marland Kitchen.. 1 two times a day 9)  Fentanyl 25 Mcg/hr Pt72 (Fentanyl) .... One every 3 days 10)  Valtrex 500 Mg Tabs (Valacyclovir hcl) .Marland Kitchen.. 1 once daily 11)  Metanx 2.8-25-2 Mg Tabs (L-methylfolate-b6-b12) .Marland Kitchen.. 1 once daily 12)  Methotrexate Sodium 25 Mg/ml Soln (Methotrexate sodium) .... .09  ml q week subcutaneously 13)  Ventolin Hfa 108 (90 Base) Mcg/act Aers (Albuterol sulfate) .... 2 puffs every 4 hours as needed for wheezing 14)  Topamax 50 Mg Tabs (Topiramate) .... One daily at bedtime 15)  Fentanyl 50 Mcg/hr Pt72 (Fentanyl) .... One every 3 days 16)  Abilify 10 Mg Tabs (Aripiprazole) .Marland Kitchen.. 1 once daily 17)  Ultram 50 Mg Tabs (Tramadol hcl) .Marland Kitchen.. 1 q6h as needed 18)  Forteo 600 Mcg/2.37ml Soln (Teriparatide  (recombinant)) .... 20 micrograms subcutaneously qam 19)  Iron 325 (65 Fe) Mg Tabs (Ferrous sulfate) .Marland Kitchen.. 1 two times a day 20)  Aspir-low 81 Mg Tbec (Aspirin) .Marland Kitchen.. 1 once daily 21)  Vitamin D (ergocalciferol) 50000 Unit Caps (Ergocalciferol) .Marland Kitchen.. 1 q week 22)  Benzonatate 100 Mg Caps (Benzonatate) .... One every 8 hours as needed for cough 23)  Prednisone 5 Mg Tabs (Prednisone) .... Two every morning 24)  Silenor 6 Mg Tabs (Doxepin hcl) .... At bedtime 25)  Methocarbamol 500 Mg Tabs (Methocarbamol) .... Take 1 tab by mouth three times a day 26)  Imuran 50 Mg Tabs (Azathioprine) .... One daily at bedtime 27)  Vesicare 10 Mg Tabs (Solifenacin succinate) .... One by mouth daily  Other Orders: UA Dipstick w/o Micro (automated)  (81003)  Patient Instructions: 1)  this since she  lives in [place] next to the hospital.  I would contact the urology group.  There and see if somebody can see you ASAP for consult.  Your urinalysis here is normal  Laboratory Results   Urine Tests  Date/Time Recieved: September 11, 2010 12:14 PM  Date/Time Reported: September 11, 2010 12:14 PM   Routine Urinalysis   Color: yellow Appearance: Clear Glucose: negative   (Normal Range: Negative) Bilirubin: negative   (Normal Range: Negative) Ketone: negative   (Normal Range: Negative) Spec. Gravity: 1.020   (Normal Range: 1.003-1.035) Blood: negative   (Normal Range: Negative) pH: 7.0   (Normal Range: 5.0-8.0) Protein: negative   (Normal Range: Negative) Urobilinogen: 0.2   (Normal Range: 0-1) Nitrite: negative   (Normal Range: Negative) Leukocyte Esterace: trace   (Normal Range: Negative)    Comments: [name] [name], CMA  September 11, 2010 12:15 PM

## 2010-12-30 NOTE — Assessment & Plan Note (Signed)
Summary: 1 month rov/njr/PT RESCD//CCM   Vital Signs:  Patient profile:   45 year old female Weight:      210 pounds Temp:     98.2 degrees F oral BP sitting:   110 / 68  (left arm) Cuff size:   regular  Vitals Entered By: Duard Brady LPN (August 06, 2010 10:22 AM) CC: f/u urinary incont. - med not working   CC:  f/u urinary incont. - med not working.  History of Present Illness: 45 year old patient who is seen today for follow-up.  She is followed by rheumatology in [facility] and is scheduled for [facility]ology follow-up next month.  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  She remains on chronic narcotics, and aggressive immunotherapy.  She has hypothyroidism, history of depression.  Her chief complaint today is over active bladder.  Gala Murdoch has not been helpful.  Follow-up laboratory studies are required.  Allergies: 1)  ! Penicillin G Potassium (Penicillin G Potassium) 2)  ! Biaxin (Clarithromycin) 3)  ! Phenergan 4)  ! 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 Appendectomy Tonsillectomy  Review of Systems       The patient complains of anorexia, weight gain, incontinence, muscle weakness, difficulty walking, depression, and unusual weight change.  The patient denies fever, weight loss, vision loss, decreased hearing, hoarseness, chest pain, syncope, dyspnea on exertion, peripheral edema, prolonged cough, headaches, hemoptysis, abdominal pain, melena, hematochezia, severe indigestion/heartburn, hematuria, genital sores, suspicious skin lesions, transient blindness, abnormal bleeding, enlarged lymph nodes, angioedema, and breast masses.    Physical Exam  General:   overweight-appearing.  normal blood pressure appears comfortable and in no distress Head:  Normocephalic and atraumatic without obvious abnormalities. No apparent alopecia or balding. Eyes:  No corneal or conjunctival inflammation noted. EOMI. Perrla. Funduscopic exam benign, without hemorrhages, exudates or papilledema. Vision grossly normal. Mouth:  Oral mucosa and oropharynx without lesions or exudates.  Teeth in good repair. no oral lesions Neck:  No deformities, masses, or tenderness noted. Lungs:  Normal respiratory effort, chest expands symmetrically. Lungs are clear to auscultation, no crackles or wheezes. Heart:  Normal rate and regular rhythm. S1 and S2 normal without gallop, murmur, click, rub or other extra sounds. Abdomen:  Bowel sounds positive,abdomen soft and non-tender without masses, organomegaly or hernias noted. Msk:  No deformity or scoliosis noted of thoracic or lumbar spine.   Pulses:  R and L carotid,radial,femoral,dorsalis pedis and posterior tibial pulses are full and equal bilaterally Skin:  Intact without suspicious lesions or rashes Cervical Nodes:  No lymphadenopathy noted Psych:  Oriented X3 and memory intact for recent and remote.     Impression & Recommendations:  Problem # 1:  OVERACTIVE BLADDER (ICD-596.51)  Problem # 2:  POLYARTHRITIS (ICD-716.59)  Orders: Venipuncture ([address]) TLB-CBC Platelet - w/Differential (85025-CBCD) TLB-BMP (Basic Metabolic Panel-BMET) (80048-METABOL) TLB-ALT (SGPT) (84460-ALT) TLB-Sedimentation Rate (ESR) (85652-ESR) Specimen Handling (98119)  Problem # 3:  PAIN, CHRONIC NEC (ICD-338.29)  Problem # 4:  HYPOTHYROIDISM (ICD-244.9)  Her updated medication list for this problem includes:    Levothyroxine Sodium 25 Mcg Tabs (Levothyroxine sodium) .Marland Kitchen... 1 once daily  Complete Medication List: 1)  Percocet 10-650 Mg Tabs (Oxycodone-acetaminophen) .... As needed 2)  Levothyroxine Sodium 25 Mcg Tabs (Levothyroxine sodium) .Marland Kitchen..  1 once daily  3)  Protonix 40 Mg Tbec (Pantoprazole sodium) .... Take 1 tablet by mouth two times a day 4)  Topamax 100 Mg Tabs (Topiramate) .Marland Kitchen.. 1 two times a day 5)  Proventil Hfa 108 (90 Base) Mcg/act Aers (Albuterol sulfate) .... 2 puffs q4h as needed for wheeze 6)  Lorazepam 1 Mg Tabs (Lorazepam) .Marland Kitchen.. 1 by mouth in am and at noon 2 by mouth qhs 7)  Diclofenac Sodium 75 Mg Tbec (Diclofenac sodium) .Marland Kitchen.. 1 two times a day 8)  Cymbalta 60 Mg Cpep (Duloxetine hcl) .Marland Kitchen.. 1 two times a day 9)  Fentanyl 25 Mcg/hr Pt72 (Fentanyl) .... One every 3 days 10)  Valtrex 500 Mg Tabs (Valacyclovir hcl) .Marland Kitchen.. 1 once daily 11)  Metanx 2.8-25-2 Mg Tabs (L-methylfolate-b6-b12) .Marland Kitchen.. 1 once daily 12)  Methotrexate Sodium 25 Mg/ml Soln (Methotrexate sodium) .... .09  ml q week subcutaneously 13)  Ventolin Hfa 108 (90 Base) Mcg/act Aers (Albuterol sulfate) .... 2 puffs every 4 hours as needed for wheezing 14)  Topamax 50 Mg Tabs (Topiramate) .... One daily at bedtime 15)  Fentanyl 50 Mcg/hr Pt72 (Fentanyl) .... One every 3 days 16)  Abilify 10 Mg Tabs (Aripiprazole) .Marland Kitchen.. 1 once daily 17)  Ultram 50 Mg Tabs (Tramadol hcl) .Marland Kitchen.. 1 q6h as needed 18)  Forteo 600 Mcg/2.49ml Soln (Teriparatide (recombinant)) .... 20 micrograms subcutaneously qam 19)  Iron 325 (65 Fe) Mg Tabs (Ferrous sulfate) .Marland Kitchen.. 1 two times a day 20)  Aspir-low 81 Mg Tbec (Aspirin) .Marland Kitchen.. 1 once daily 21)  Vitamin D (ergocalciferol) 50000 Unit Caps (Ergocalciferol) .Marland Kitchen.. 1 q week 22)  Benzonatate 100 Mg Caps (Benzonatate) .... One every 8 hours as needed for cough 23)  Prednisone 5 Mg Tabs (Prednisone) .... Two every morning 24)  Silenor 6 Mg Tabs (Doxepin hcl) .... At bedtime 25)  Methocarbamol 500 Mg Tabs (Methocarbamol) .... Take 1 tab by mouth three times a day 26)  Enablex 15 Mg Xr24h-tab (Darifenacin hydrobromide) .... One daily 27)  Imuran 50 Mg Tabs (Azathioprine) .... One daily at bedtime  Patient Instructions: 1)  Please schedule a follow-up  appointment in 3 months. 2)  Limit your Sodium (Salt) to less than 2 grams a day(slightly less than 1/2 a teaspoon) to prevent fluid retention, swelling, or worsening of symptoms. 3)  It is important that you exercise regularly at least 20 minutes 5 times a week. If you develop chest pain, have severe difficulty breathing, or feel very tired , stop exercising immediately and seek medical attention. 4)  f/u [facility] as scheduled Prescriptions: ENABLEX 15 MG XR24H-TAB (DARIFENACIN HYDROBROMIDE) one daily  #90 x 6   Entered and Authorized by:   Gordy Savers  MD   Signed by:   Gordy Savers  MD on 08/06/2010   Method used:   Electronically to        [facility]       [address]       [place], [place]  [address]       Ph: [phone]       Fax: [phone]   RxID:   [id number]   Appended Document: 1 month rov/njr/PT RESCD//CCM  Flu Vaccine Consent Questions     Do you have a history of severe allergic reactions to this vaccine? no    Any prior history of allergic reactions to egg and/or gelatin? no    Do you have a sensitivity to the preservative Thimersol?  no    Do you have a past history of Guillan-Barre Syndrome? no    Do you currently have an acute febrile illness? no    Have you ever had a severe reaction to latex? no    Vaccine information given and explained to patient? yes    Are you currently pregnant? no    Lot Number:[id number]   Exp Date:05/30/2011   Site Given  Left Deltoid IM    Allergies: 1)  ! Penicillin G Potassium (Penicillin G Potassium) 2)  ! Biaxin (Clarithromycin) 3)  ! Phenergan 4)  ! Dhea (Nutritional Supplements)   Complete Medication List: 1)  Percocet 10-650 Mg Tabs (Oxycodone-acetaminophen) .... As needed 2)  Levothyroxine Sodium 25 Mcg Tabs (Levothyroxine sodium) .Marland Kitchen.. 1 once daily 3)  Protonix 40 Mg Tbec (Pantoprazole sodium) .... Take 1 tablet by mouth two times a  day 4)  Topamax 100 Mg Tabs (Topiramate) .Marland Kitchen.. 1 two times a day 5)  Proventil Hfa 108 (90 Base) Mcg/act Aers (Albuterol sulfate) .... 2 puffs q4h as needed for wheeze 6)  Lorazepam 1 Mg Tabs (Lorazepam) .Marland Kitchen.. 1 by mouth in am and at noon 2 by mouth qhs 7)  Diclofenac Sodium 75 Mg Tbec (Diclofenac sodium) .Marland Kitchen.. 1 two times a day 8)  Cymbalta 60 Mg Cpep (Duloxetine hcl) .Marland Kitchen.. 1 two times a day 9)  Fentanyl 25 Mcg/hr Pt72 (Fentanyl) .... One every 3 days 10)  Valtrex 500 Mg Tabs (Valacyclovir hcl) .Marland Kitchen.. 1 once daily 11)  Metanx 2.8-25-2 Mg Tabs (L-methylfolate-b6-b12) .Marland Kitchen.. 1 once daily 12)  Methotrexate Sodium 25 Mg/ml Soln (Methotrexate sodium) .... .09  ml q week subcutaneously 13)  Ventolin Hfa 108 (90 Base) Mcg/act Aers (Albuterol sulfate) .... 2 puffs every 4 hours as needed for wheezing 14)  Topamax 50 Mg Tabs (Topiramate) .... One daily at bedtime 15)  Fentanyl 50 Mcg/hr Pt72 (Fentanyl) .... One every 3 days 16)  Abilify 10 Mg Tabs (Aripiprazole) .Marland Kitchen.. 1 once daily 17)  Ultram 50 Mg Tabs (Tramadol hcl) .Marland Kitchen.. 1 q6h as needed 18)  Forteo 600 Mcg/2.64ml Soln (Teriparatide (recombinant)) .... 20 micrograms subcutaneously qam 19)  Iron 325 (65 Fe) Mg Tabs (Ferrous sulfate) .Marland Kitchen.. 1 two times a day 20)  Aspir-low 81 Mg Tbec (Aspirin) .Marland Kitchen.. 1 once daily 21)  Vitamin D (ergocalciferol) 50000 Unit Caps (Ergocalciferol) .Marland Kitchen.. 1 q week 22)  Benzonatate 100 Mg Caps (Benzonatate) .... One every 8 hours as needed for cough 23)  Prednisone 5 Mg Tabs (Prednisone) .... Two every morning 24)  Silenor 6 Mg Tabs (Doxepin hcl) .... At bedtime 25)  Methocarbamol 500 Mg Tabs (Methocarbamol) .... Take 1 tab by mouth three times a day 26)  Enablex 15 Mg Xr24h-tab (Darifenacin hydrobromide) .... One daily 27)  Imuran 50 Mg Tabs (Azathioprine) .... One daily at bedtime  Other Orders: Admin 1st Vaccine (16109) Flu Vaccine 45yrs + [phone])

## 2010-12-30 NOTE — Assessment & Plan Note (Signed)
Summary: 3 month fup//ccm   Vital Signs:  Patient profile:   45 year old female Weight:      211 pounds Temp:     98.3 degrees F BP sitting:   104 / 72  (right arm) Cuff size:   regular  Vitals Entered By: Duard Brady LPN (November 05, 2010 10:51 AM) CC: 3 mos rov - doing well Is Patient Diabetic? No   CC:  3 mos rov - doing well.  History of Present Illness: 45 year old patient who is seen today for follow-up.  since her last visit here, she has been seen by [facility] rheumatology.  Laboratory screen unremarkable.  She has a chronic pain syndrome with a poorly defined rheumatologic disorder.  She has a history of anxiety, depression.  She continues to be followed by Dr. Park Pope in [facility].  She has a history of weight gain, hypothyroidism, gastroesophageal reflux disease.  Remains on prednisone, as well as immunosuppressant therapy.  Allergies: 1)  ! Penicillin G Potassium (Penicillin G Potassium) 2)  ! Biaxin (Clarithromycin) 3)  ! Phenergan 4)  ! Dhea (Nutritional Supplements)  Past History:  Past Medical History: Anxiety Depression GERD Hypothyroidism Chronic pain Polyarthritis/ possible lupus variant Raynaud's, h/o  optic neuritis, h/o overactive bladder Nephrolithiasis, hx of   Past Surgical History: Reviewed history from 06/06/2007 and no changes required. Appendectomy Tonsillectomy  Family History: Reviewed history from 10/03/2007 and no changes required. father's health unknown mother at 35, is in good health one sister with asthma  Social History: Reviewed history from 10/03/2007 and no changes required. 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  The patient denies anorexia, fever, weight loss, vision loss, decreased hearing, hoarseness, chest pain, syncope, dyspnea on exertion,  peripheral edema, prolonged cough, headaches, hemoptysis, abdominal pain, melena, hematochezia, severe indigestion/heartburn, hematuria, incontinence, genital sores, suspicious skin lesions, transient blindness, difficulty walking, depression, unusual weight change, abnormal bleeding, enlarged lymph nodes, angioedema, and breast masses.    Physical Exam  General:  overweight-appearing.  no distress, bright affect.  Blood pressure low normaloverweight-appearing.   Head:  Normocephalic and atraumatic without obvious abnormalities. No apparent alopecia or balding. Eyes:  No corneal or conjunctival inflammation noted. EOMI. Perrla. Funduscopic exam benign, without hemorrhages, exudates or papilledema. Vision grossly normal. Mouth:  Oral mucosa and oropharynx without lesions or exudates.  Teeth in good repair. Neck:  No deformities, masses, or tenderness noted. Lungs:  Normal respiratory effort, chest expands symmetrically. Lungs are clear to auscultation, no crackles or wheezes. Heart:  Normal rate and regular rhythm. S1 and S2 normal without gallop, murmur, click, rub or other extra sounds. Abdomen:  Bowel sounds positive,abdomen soft and non-tender without masses, organomegaly or hernias noted. Msk:  no active synovitis   Impression & Recommendations:  Problem # 1:  NEPHROLITHIASIS, HX OF (ICD-V13.01)  Problem # 2:  OSTEOPENIA (ICD-733.90)  Her updated medication list for this problem includes:    Forteo 600 Mcg/2.5ml Soln (Teriparatide (recombinant)) .Marland Kitchen... 20 micrograms subcutaneously qam  Her updated medication list for this problem includes:    Forteo 600 Mcg/2.66ml Soln (Teriparatide (recombinant)) .Marland Kitchen... 20 micrograms subcutaneously qam  Problem # 3:  POLYARTHRITIS (ICD-716.59)  Orders: TLB-CBC Platelet - w/Differential (85025-CBCD) TLB-Creatinine, Blood (82565-CREA) TLB-ALT (SGPT) (84460-ALT) TLB-CRP-High Sensitivity (C-Reactive Protein) (86140-FCRP) T- * Misc. Laboratory test  [phone])  Problem # 4:  HYPOTHYROIDISM (ICD-244.9)  Her updated medication list for this problem includes:    Levothyroxine Sodium 25 Mcg Tabs (Levothyroxine sodium) .Marland Kitchen... 1 once daily  Her updated medication list for this problem includes:    Levothyroxine Sodium 25 Mcg Tabs (Levothyroxine sodium) .Marland Kitchen... 1 once daily  Complete Medication List: 1)  Percocet 10-650 Mg Tabs (Oxycodone-acetaminophen) .... As needed 2)  Levothyroxine Sodium 25 Mcg Tabs (Levothyroxine sodium) .Marland Kitchen.. 1 once daily 3)  Protonix 40 Mg Tbec (Pantoprazole sodium) .... Take 1 tablet by mouth two times a day 4)  Topamax 100 Mg Tabs (Topiramate) .Marland Kitchen.. 1 two times a day 5)  Proventil Hfa 108 (90 Base) Mcg/act Aers (Albuterol sulfate) .... 2 puffs q4h as needed for wheeze 6)  Lorazepam 1 Mg Tabs (Lorazepam) .Marland Kitchen.. 1 by mouth in am and at noon 2 by mouth qhs 7)  Diclofenac Sodium 75 Mg Tbec (Diclofenac sodium) .Marland Kitchen.. 1 two times a day 8)  Cymbalta 60 Mg Cpep (Duloxetine hcl) .Marland Kitchen.. 1 two times a day 9)  Fentanyl 25 Mcg/hr Pt72 (Fentanyl) .... One every 3 days 10)  Valtrex 500 Mg Tabs (Valacyclovir hcl) .Marland Kitchen.. 1 once daily 11)  Metanx 2.8-25-2 Mg Tabs (L-methylfolate-b6-b12) .Marland Kitchen.. 1 once daily 12)  Methotrexate Sodium 25 Mg/ml Soln (Methotrexate sodium) .... .09  ml q week subcutaneously 13)  Topamax 50 Mg Tabs (Topiramate) .... One daily at bedtime 14)  Fentanyl 50 Mcg/hr Pt72 (Fentanyl) .... One every 3 days 15)  Abilify 10 Mg Tabs (Aripiprazole) .Marland Kitchen.. 1 once daily 16)  Ultram 50 Mg Tabs (Tramadol hcl) .Marland Kitchen.. 1 q6h as needed 17)  Forteo 600 Mcg/2.21ml Soln (Teriparatide (recombinant)) .... 20 micrograms subcutaneously qam 18)  Iron 325 (65 Fe) Mg Tabs (Ferrous sulfate) .Marland Kitchen.. 1 two times a day 19)  Aspir-low 81 Mg Tbec (Aspirin) .Marland Kitchen.. 1 once daily 20)  Benzonatate 100 Mg Caps (Benzonatate) .... One every 8 hours as needed for cough 21)  Prednisone 5 Mg Tabs (Prednisone) .... Two every morning 22)  Silenor 6 Mg Tabs (Doxepin hcl) .... At  bedtime 23)  Methocarbamol 500 Mg Tabs (Methocarbamol) .... Take 1 tab by mouth three times a day 24)  Imuran 50 Mg Tabs (Azathioprine) .... One daily at bedtime 25)  Vesicare 10 Mg Tabs (Solifenacin succinate) .... One by mouth daily 26)  Vitamin D 2000 Unit Tabs (Cholecalciferol) .... Qd  Patient Instructions: 1)  Please schedule a follow-up appointment in 3 months. 2)  Limit your Sodium (Salt) to less than 2 grams a day(slightly less than 1/2 a teaspoon) to prevent fluid retention, swelling, or worsening of symptoms. 3)  It is important that you exercise regularly at least 20 minutes 5 times a week. If you develop chest pain, have severe difficulty breathing, or feel very tired , stop exercising immediately and seek medical attention. 4)  You need to lose weight. Consider a lower calorie diet and regular exercise.  5)  rheumatology follow-up as scheduled    Orders Added: 1)  Est. Patient Level IV [41324] 2)  TLB-CBC Platelet - w/Differential [85025-CBCD] 3)  TLB-Creatinine, Blood [82565-CREA] 4)  TLB-ALT (SGPT) [84460-ALT] 5)  TLB-CRP-High Sensitivity (C-Reactive Protein) [86140-FCRP] 6)  T- * Misc. Laboratory test [phone]  Appended Document: Orders Update    Clinical Lists Changes  Orders: Added new Service order of Specimen Handling (72536) - Signed Added new Service order of Venipuncture (64403) - Signed

## 2010-12-30 NOTE — Assessment & Plan Note (Signed)
Summary: ?bronchitus/cjr   Vital Signs:  Patient profile:   45 year old female Weight:      200 pounds BMI:     28.80 O2 Sat:      95 % on Room air Temp:     98.8 degrees F oral Pulse rate:   92 / minute Pulse rhythm:   regular BP sitting:   98 / 58  (left arm) Cuff size:   regular  Vitals Entered By: Raechel Ache, RN (December 30, 2009 9:34 AM)  O2 Flow:  Room air CC: D/C'd from hospital last week; developed productive cough, sore chest on Friday.   CC:  D/C'd from hospital last week; developed productive cough and sore chest on Friday.Marland Kitchen  History of Present Illness: 45 -year-old female, who is followed by rheumatology for a  lupus like syndrome.  she has been discharged recently for a flare of her chronic pain and probably arthralgias.  For the past 3 days, she has had a nonproductive cough.  This has led to increasing stress incontinence.  No fever or wheezing or sputum production.  Does complain of low-grade fever, but no rigors.  She does have albuterol for home use.  Allergies: 1)  ! Penicillin G Potassium (Penicillin G Potassium) 2)  ! Biaxin (Clarithromycin) 3)  ! Phenergan 4)  ! Dhea (Nutritional Supplements)  Past History:  Past Medical History: Reviewed history from 10/14/2009 and no changes required. Anxiety Depression GERD Hypothyroidism Chronic pain Polyarthritis Raynaud's optic neuritis overactive bladder  Past Surgical History: Reviewed history from 06/06/2007 and no changes required. Appendectomy Tonsillectomy  Family History: Reviewed history from 10/03/2007 and no changes required. father's health unknown mother at 58, is in good health one sister with asthma  Social History: Reviewed history from 10/03/2007 and no changes required. 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   The patient denies weight loss, weight gain, vision loss, decreased hearing, chest pain, syncope, dyspnea on exertion, peripheral edema, headaches, hemoptysis, abdominal pain, melena, hematochezia, severe indigestion/heartburn, hematuria, incontinence, genital sores, muscle weakness, suspicious skin lesions, transient blindness, difficulty walking, depression, unusual weight change, abnormal bleeding, enlarged lymph nodes, angioedema, and breast masses.    Physical Exam  General:  Well-developed,well-nourished,in no acute distress; alert,appropriate and cooperative throughout examination Head:  Normocephalic and atraumatic without obvious abnormalities. No apparent alopecia or balding. Eyes:  No corneal or conjunctival inflammation noted. EOMI. Perrla. Funduscopic exam benign, without hemorrhages, exudates or papilledema. Vision grossly normal. Ears:  External ear exam shows no significant lesions or deformities.  Otoscopic examination reveals clear canals, tympanic membranes are intact bilaterally without bulging, retraction, inflammation or discharge. Hearing is grossly normal bilaterally. cerumen in left canal and Nose:  External nasal examination shows no deformity or inflammation. Nasal mucosa are pink and moist without lesions or exudates. Mouth:  Oral mucosa and oropharynx without lesions or exudates.  Teeth in good repair. Neck:  No deformities, masses, or tenderness noted. Lungs:  Normal respiratory effort, chest expands symmetrically. Lungs are clear to auscultation, no crackles or wheezes. Heart:  Normal rate and regular rhythm. S1 and S2 normal without gallop, murmur, click, rub or other extra sounds. Abdomen:  Bowel sounds positive,abdomen soft and non-tender without masses, organomegaly or hernias noted. Msk:  no signs active synovitis   Impression & Recommendations:  Problem # 1:  URI (  ICD-465.9)  Her updated medication list for this problem includes:    Diclofenac Sodium 75 Mg  Tbec (Diclofenac sodium) .Marland Kitchen... 1 two times a day    Aspir-low 81 Mg Tbec (Aspirin) .Marland Kitchen... 1 once daily    Benzonatate 100 Mg Caps (Benzonatate) ..... One every 8 hours as needed for cough    Her updated medication list for this problem includes:    Diclofenac Sodium 75 Mg Tbec (Diclofenac sodium) .Marland Kitchen... 1 two times a day    Aspir-low 81 Mg Tbec (Aspirin) .Marland Kitchen... 1 once daily    Benzonatate 100 Mg Caps (Benzonatate) ..... One every 8 hours as needed for cough  Orders: Prescription Created Electronically [phone])  Problem # 2:  WEIGHT GAIN (ICD-783.1)  Problem # 3:  PAIN, CHRONIC NEC (ICD-338.29)  Complete Medication List: 1)  Percocet 10-650 Mg Tabs (Oxycodone-acetaminophen) .... As needed 2)  Levothyroxine Sodium 25 Mcg Tabs (Levothyroxine sodium) .Marland Kitchen.. 1 once daily 3)  Protonix 40 Mg Tbec (Pantoprazole sodium) .... Take 1 tablet by mouth two times a day 4)  Topamax 100 Mg Tabs (Topiramate) .Marland Kitchen.. 1 two times a day 5)  Proventil Hfa 108 (90 Base) Mcg/act Aers (Albuterol sulfate) .... 2 puffs q4h as needed for wheeze 6)  Lorazepam 1 Mg Tabs (Lorazepam) .Marland Kitchen.. 1 by mouth in am and at noon 2 by mouth qhs 7)  Diclofenac Sodium 75 Mg Tbec (Diclofenac sodium) .Marland Kitchen.. 1 two times a day 8)  Cymbalta 60 Mg Cpep (Duloxetine hcl) .Marland Kitchen.. 1 two times a day 9)  Prednisone 8 Mg Tabs (prednisone)  .Marland Kitchen.. 1 once daily 10)  Fentanyl 25 Mcg/hr Pt72 (Fentanyl) .... One every 3 days 11)  Valtrex 500 Mg Tabs (Valacyclovir hcl) .Marland Kitchen.. 1 once daily 12)  Metanx 2.8-25-2 Mg Tabs (L-methylfolate-b6-b12) .Marland Kitchen.. 1 once daily 13)  Methotrexate Sodium 25 Mg/ml Soln (Methotrexate sodium) .... .09  ml q week subcutaneously 14)  Ventolin Hfa 108 (90 Base) Mcg/act Aers (Albuterol sulfate) .... 2 puffs every 4 hours as needed for wheezing 15)  Topamax 50 Mg Tabs (Topiramate) .... One daily at bedtime 16)  Fentanyl 50 Mcg/hr Pt72 (Fentanyl) .... One every 3 days 17)  Abilify 10 Mg Tabs (Aripiprazole) .Marland Kitchen.. 1 once daily 18)  Ultram 50 Mg  Tabs (Tramadol hcl) .Marland Kitchen.. 1 q6h as needed 19)  Forteo 600 Mcg/2.74ml Soln (Teriparatide (recombinant)) .... 20 micrograms subcutaneously qam 20)  Iron 325 (65 Fe) Mg Tabs (Ferrous sulfate) .Marland Kitchen.. 1 two times a day 21)  Aspir-low 81 Mg Tbec (Aspirin) .Marland Kitchen.. 1 once daily 22)  Vitamin D (ergocalciferol) 50000 Unit Caps (Ergocalciferol) .Marland Kitchen.. 1 q week 23)  Enablex 15 Mg Xr24h-tab (Darifenacin hydrobromide) .... One daily 24)  Benzonatate 100 Mg Caps (Benzonatate) .... One every 8 hours as needed for cough  Patient Instructions: 1)  Get plenty of rest, drink lots of clear liquids, and use Tylenol or Ibuprofen for fever and comfort. Return in 7-10 days if you're not better:sooner if you're feeling worse. 2)  Please schedule a follow-up appointment in 6 months. 3)  It is important that you exercise regularly at least 20 minutes 5 times a week. If you develop chest pain, have severe difficulty breathing, or feel very tired , stop exercising immediately and seek medical attention. 4)  You need to lose weight. Consider a lower calorie diet and regular exercise.  Prescriptions: BENZONATATE 100 MG CAPS (BENZONATATE) one every 8 hours as needed for cough  #30 x 2   Entered and Authorized by:   Theron Arista  Lysle Dingwall  MD   Signed by:   Gordy Savers  MD on 12/30/2009   Method used:   Print then Give to Patient   RxID:   [id number] BENZONATATE 100 MG CAPS (BENZONATATE) one every 8 hours as needed for cough  #30 x 2   Entered and Authorized by:   Gordy Savers  MD   Signed by:   Gordy Savers  MD on 12/30/2009   Method used:   [facility]ron[facility]        [facility]       [address]       [place], [place]  [address]       Ph: [phone]       Fax: [phone]   RxID:   [id number]

## 2010-12-30 NOTE — Assessment & Plan Note (Signed)
Summary: PAIN//CCM   Vital Signs:  Patient profile:   45 year old female Weight:      199 pounds Temp:     98.7 degrees F oral BP sitting:   90 / 60  (left arm) Cuff size:   regular  Vitals Entered By: Duard Brady LPN (January 31, 9146 11:03 AM) CC: c/o increase in lupus pain , pain in hips and knees  , c/o cramp in leg (R)   , wants RF on Flexaril Is Patient Diabetic? No   CC:  c/o increase in lupus pain , pain in hips and knees  , c/o cramp in leg (R)   , and wants RF on Flexaril.  History of Present Illness: 45 year old patient seen today for follow-up of her lupus syndrome.  She has been out of prednisone for the past few days and has had a flare with increasing hip and knee pain.  She states that she attempted to contact her anisate rheumatologist and was unsuccessful.  He manages her lupus and chronic pain syndrome.  She has a history depression, which has been fairly stable.  She is accompanied by her fianc today.  She has hypothyroidism and history depression.  She has a history of overactive bladder, controlled with therapy.  Also describes some cramping involving her right thigh region  Preventive Screening-Counseling & Management  Alcohol-Tobacco     Smoking Status: never  Allergies: 1)  ! Penicillin G Potassium (Penicillin G Potassium) 2)  ! Biaxin (Clarithromycin) 3)  ! Phenergan 4)  ! Dhea (Nutritional Supplements)  Past History:  Past Medical History: Reviewed history from 10/14/2009 and no changes required. Anxiety Depression GERD Hypothyroidism Chronic pain Polyarthritis Raynaud's optic neuritis overactive bladder  Review of Systems       The patient complains of anorexia, weight gain, difficulty walking, and depression.  The patient denies fever, weight loss, vision loss, decreased hearing, hoarseness, chest pain, syncope, dyspnea on exertion, peripheral edema, prolonged cough, headaches, hemoptysis, abdominal pain, melena, hematochezia, severe  indigestion/heartburn, hematuria, incontinence, genital sores, muscle weakness, suspicious skin lesions, transient blindness, unusual weight change, abnormal bleeding, enlarged lymph nodes, angioedema, and breast masses.    Physical Exam  General:  overweight-appearing.  normal blood pressure.  No acute distress Head:  Normocephalic and atraumatic without obvious abnormalities. No apparent alopecia or balding. Mouth:  Oral mucosa and oropharynx without lesions or exudates.  Teeth in good repair. Neck:  No deformities, masses, or tenderness noted. Lungs:  Normal respiratory effort, chest expands symmetrically. Lungs are clear to auscultation, no crackles or wheezes. Heart:  Normal rate and regular rhythm. S1 and S2 normal without gallop, murmur, click, rub or other extra sounds. Abdomen:  Bowel sounds positive,abdomen soft and non-tender without masses, organomegaly or hernias noted. Msk:  no signs active synovitis   Impression & Recommendations:  Problem # 1:  OVERACTIVE BLADDER (ICD-596.51)  Problem # 2:  POLYARTHRITIS (ICD-716.59)  Problem # 3:  DEPRESSION (ICD-311)  Her updated medication list for this problem includes:    Lorazepam 1 Mg Tabs (Lorazepam) .Marland Kitchen... 1 by mouth in am and at noon 2 by mouth qhs    Cymbalta 60 Mg Cpep (Duloxetine hcl) .Marland Kitchen... 1 two times a day  Complete Medication List: 1)  Percocet 10-650 Mg Tabs (Oxycodone-acetaminophen) .... As needed 2)  Levothyroxine Sodium 25 Mcg Tabs (Levothyroxine sodium) .Marland Kitchen.. 1 once daily 3)  Protonix 40 Mg Tbec (Pantoprazole sodium) .... Take 1 tablet by mouth two times a day 4)  Topamax 100 Mg Tabs (Topiramate) .Marland Kitchen.. 1 two times a day 5)  Proventil Hfa 108 (90 Base) Mcg/act Aers (Albuterol sulfate) .... 2 puffs q4h as needed for wheeze 6)  Lorazepam 1 Mg Tabs (Lorazepam) .Marland Kitchen.. 1 by mouth in am and at noon 2 by mouth qhs 7)  Diclofenac Sodium 75 Mg Tbec (Diclofenac sodium) .Marland Kitchen.. 1 two times a day 8)  Cymbalta 60 Mg Cpep (Duloxetine  hcl) .Marland Kitchen.. 1 two times a day 9)  Fentanyl 25 Mcg/hr Pt72 (Fentanyl) .... One every 3 days 10)  Valtrex 500 Mg Tabs (Valacyclovir hcl) .Marland Kitchen.. 1 once daily 11)  Metanx 2.8-25-2 Mg Tabs (L-methylfolate-b6-b12) .Marland Kitchen.. 1 once daily 12)  Methotrexate Sodium 25 Mg/ml Soln (Methotrexate sodium) .... .09  ml q week subcutaneously 13)  Ventolin Hfa 108 (90 Base) Mcg/act Aers (Albuterol sulfate) .... 2 puffs every 4 hours as needed for wheezing 14)  Topamax 50 Mg Tabs (Topiramate) .... One daily at bedtime 15)  Fentanyl 50 Mcg/hr Pt72 (Fentanyl) .... One every 3 days 16)  Abilify 10 Mg Tabs (Aripiprazole) .Marland Kitchen.. 1 once daily 17)  Ultram 50 Mg Tabs (Tramadol hcl) .Marland Kitchen.. 1 q6h as needed 18)  Forteo 600 Mcg/2.48ml Soln (Teriparatide (recombinant)) .... 20 micrograms subcutaneously qam 19)  Iron 325 (65 Fe) Mg Tabs (Ferrous sulfate) .Marland Kitchen.. 1 two times a day 20)  Aspir-low 81 Mg Tbec (Aspirin) .Marland Kitchen.. 1 once daily 21)  Vitamin D (ergocalciferol) 50000 Unit Caps (Ergocalciferol) .Marland Kitchen.. 1 q week 22)  Enablex 15 Mg Xr24h-tab (Darifenacin hydrobromide) .... One daily 23)  Benzonatate 100 Mg Caps (Benzonatate) .... One every 8 hours as needed for cough 24)  Prednisone 5 Mg Tabs (Prednisone) .... Two every morning  Other Orders: Depo- Medrol 80mg  (J1040) Admin of Therapeutic Inj  intramuscular or subcutaneous ([id number])  Patient Instructions: 1)  Please schedule a follow-up appointment in 4 months. 2)  Limit your Sodium (Salt). 3)  It is important that you exercise regularly at least 20 minutes 5 times a week. If you develop chest pain, have severe difficulty breathing, or feel very tired , stop exercising immediately and seek medical attention. 4)  You need to lose weight. Consider a lower calorie diet and regular exercise.  Prescriptions: PREDNISONE 5 MG TABS (PREDNISONE) two every morning  #100 x 3   Entered and Authorized by:   Gordy Savers  MD   Signed by:   Gordy Savers  MD on 01/31/2010   Method used:    Print then Give to Patient   RxID:   [id number] PREDNISONE 5 MG TABS (PREDNISONE) two every morning  #100 x 3   Entered and Authorized by:   Gordy Savers  MD   Signed by:   Gordy Savers  MD on 01/31/2010   Method used:   Electronically to        [facility]       [address]       [place], [place]  [address]       Ph: [phone]       Fax: [phone]   RxID:   [id number]    Medication Administration  Injection # 1:    Medication: Depo- Medrol 80mg     Diagnosis: URI (ICD-465.9)    Route: IM    Site: L deltoid    Exp Date: 07/2012    Lot #: [id number] Heidelberg    Mfr: Pharmacia    Patient tolerated injection  without complications    Given by: Duard Brady LPN (January 31, 1609 12:32 PM)  Orders Added: 1)  Est. Patient Level III [96045] 2)  Depo- Medrol 80mg  [J1040] 3)  Admin of Therapeutic Inj  intramuscular or subcutaneous [40981]

## 2010-12-30 NOTE — Consult Note (Signed)
Summary: [facility]   Imported By: Lanelle Bal 10/10/2010 08:36:21  _____________________________________________________________________  External Attachment:    Type:   Image     Comment:   External Document

## 2010-12-30 NOTE — Progress Notes (Signed)
Summary: Requesting Referral  Phone Note Call from Patient   Caller: Patient Call For: [name] Savers  MD Summary of Call: Pt is requesting referral to Rheumtologist she is requesting Dr. Misty Stanley Cisco-Shiver at [facility] that number is [phone]  Initial call taken by: Kathrynn Speed CMA,  June 03, 2010 4:23 PM  Follow-up for Phone Call        wspoke with pt - she is aware Dr. Amador Cunas out of office and this will be taken care of next week. KIK Follow-up by: Duard Brady LPN,  June 03, 9146 5:19 PM  Additional Follow-up for Phone Call Additional follow up Details #1::        OK Additional Follow-up by: [name] Savers  MD,  June 08, 2010 8:11 PM    Additional Follow-up for Phone Call Additional follow up Details #2::    order sent to Ter[facility]  KIK Follow-up by: Duard Brady LPN,  June 09, 2010 9:32 AM

## 2010-12-30 NOTE — Progress Notes (Signed)
Summary: tylenol & liver toxicity?  Phone Note Call from Patient Call back at [phone]   Summary of Call: Took tylenol arthritis for hip arthritis, 8 hr preparation, 1pm then took vicodin for hip pain which also has tylenol.  Should I worry about liver toxicity? Initial call taken by: Rudy Jew, RN,  September 08, 2010 4:45 PM  Follow-up for Phone Call        max daily dose  should be less than  4000 mg  Follow-up by: Gordy Savers  MD,  September 08, 2010 5:22 PM  Additional Follow-up for Phone Call Additional follow up Details #1::        attempt to call - ans mach - LMTCB if questions  , gave info from Dr, Frederica Kuster r/t tylenol KIK Additional Follow-up by: Duard Brady LPN,  September 11, 2010 12:47 PM

## 2010-12-30 NOTE — Progress Notes (Signed)
Summary: lupus pain  Phone Note Call from Patient   Caller: Patient Call For: Gordy Savers  MD Summary of Call: Pt is having a lupus pain crisis.  Took Prednisone 10 mg this am, and wants to know how to take the Prednisone and how much? [id number] Pain is severe.  Taking Percocet for pain. Initial call taken by: Lynann Beaver CMA,  March 20, 2010 3:57 PM  Follow-up for Phone Call        30 mg twice daily for 3 days, then 20 mg twice daily for three days ,then 10 mg twice daily for three days, then 10 mg daily Follow-up by: Gordy Savers  MD,  March 20, 2010 4:55 PM  Additional Follow-up for Phone Call Additional follow up Details #1::        Pt. advised. Additional Follow-up by: Lynann Beaver CMA,  March 20, 2010 5:07 PM

## 2010-12-30 NOTE — Assessment & Plan Note (Signed)
Summary: ?uti inf/cjr   Vital Signs:  Patient profile:   45 year old female Height:      70 inches (177.80 cm) Weight:      211.31 pounds (96.05 kg) O2 Sat:      98 % on Room air Temp:     98.2 degrees F (36.78 degrees C) oral Pulse rate:   97 / minute BP sitting:   122 / 84  (left arm) Cuff size:   regular  Vitals Entered By: Josph Macho RMA (July 08, 2010 9:46 AM)  O2 Flow:  Room air CC: Possible UTI/ pt states she is urinating all the time-pt states theres no burning or itching/ pt needs refills on Protonix, Cymbalta, Topamax, and Prednisone/CF Is Patient Diabetic? No   History of Present Illness: Patient in today for evaluation of some urinary frequency. She was having similar frequency earlier this year with some associated stress urinary incontinence and was placed on Enablex. Initially she had a good response to Enablex with decreased frequency and incontinence. The incontinence has not returned but the frequency has just in the past few days. No hematuria, dysuria, fevers, chills, abdominal or increaed back pain. She denies any vaginal discharge/anorexia/malaise/GI c/o. No CP/palp. Review of her history reveals a complicated medical history and she is asking for a letter to be excused from jury duty because she cannot handle the stress and she does not believe she could sit for extended periods of time. She also brings in an Rx from [facility] in [place], [place] for labs to drawn. Notably a CBC, CRP, ALT, AST, CR for dx 710.9 and 447.6 and asks that we draw here which she reports has been done before. No CP/palp/SOB.  Current Medications (verified): 1)  Percocet 10-650 Mg  Tabs (Oxycodone-Acetaminophen) .... As Needed 2)  Levothyroxine Sodium 25 Mcg  Tabs (Levothyroxine Sodium) .Marland Kitchen.. 1 Once Daily 3)  Protonix 40 Mg  Tbec (Pantoprazole Sodium) .... Take 1 Tablet By Mouth Two Times A Day 4)  Topamax 100 Mg  Tabs (Topiramate) .Marland Kitchen.. 1 Two Times A Day 5)   Proventil Hfa 108 (90 Base) Mcg/act  Aers (Albuterol Sulfate) .... 2 Puffs Q4h As Needed For Wheeze 6)  Lorazepam 1 Mg  Tabs (Lorazepam) .Marland Kitchen.. 1 By Mouth in Am and At Noon 2 By Mouth Qhs 7)  Diclofenac Sodium 75 Mg  Tbec (Diclofenac Sodium) .Marland Kitchen.. 1 Two Times A Day 8)  Cymbalta 60 Mg  Cpep (Duloxetine Hcl) .Marland Kitchen.. 1 Two Times A Day 9)  Fentanyl 25 Mcg/hr  Pt72 (Fentanyl) .... One Every 3 Days 10)  Valtrex 500 Mg  Tabs (Valacyclovir Hcl) .Marland Kitchen.. 1 Once Daily 11)  Metanx 2.8-25-2 Mg  Tabs (L-Methylfolate-B6-B12) .Marland Kitchen.. 1 Once Daily 12)  Methotrexate Sodium 25 Mg/ml  Soln (Methotrexate Sodium) .... .09  Ml Q Week Subcutaneously 13)  Ventolin Hfa 108 (90 Base) Mcg/act  Aers (Albuterol Sulfate) .... 2 Puffs Every 4 Hours As Needed For Wheezing 14)  Topamax 50 Mg Tabs (Topiramate) .... One Daily At Bedtime 15)  Fentanyl 50 Mcg/hr Pt72 (Fentanyl) .... One Every 3 Days 16)  Abilify 10 Mg Tabs (Aripiprazole) .Marland Kitchen.. 1 Once Daily 17)  Ultram 50 Mg Tabs (Tramadol Hcl) .Marland Kitchen.. 1 Q6h As Needed 18)  Forteo 600 Mcg/2.71ml Soln (Teriparatide (Recombinant)) .... 20 Micrograms Subcutaneously Qam 19)  Iron 325 (65 Fe) Mg Tabs (Ferrous Sulfate) .Marland Kitchen.. 1 Two Times A Day 20)  Aspir-Low 81 Mg Tbec (Aspirin) .Marland Kitchen.. 1 Once Daily 21)  Vitamin  D (Ergocalciferol) 50000 Unit Caps (Ergocalciferol) .Marland Kitchen.. 1 Q Week 22)  Enablex 15 Mg Xr24h-Tab (Darifenacin Hydrobromide) .... One Daily 23)  Benzonatate 100 Mg Caps (Benzonatate) .... One Every 8 Hours As Needed For Cough 24)  Prednisone 5 Mg Tabs (Prednisone) .... Two Every Morning  Allergies (verified): 1)  ! Penicillin G Potassium (Penicillin G Potassium) 2)  ! Biaxin (Clarithromycin) 3)  ! Phenergan 4)  ! Dhea (Nutritional Supplements)  Past History:  Past medical history reviewed for relevance to current acute and chronic problems. Social history (including risk factors) reviewed for relevance to current acute and chronic problems.  Past Medical History: Reviewed history from  10/14/2009 and no changes required. Anxiety Depression GERD Hypothyroidism Chronic pain Polyarthritis Raynaud's optic neuritis overactive bladder  Social History: Reviewed history from 10/03/2007 and no changes required. 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 No apparent alopecia or balding. Mouth:  Oral mucosa and oropharynx without lesions or exudates.  Teeth in good repair. Lungs:  Normal respiratory effort, chest expands symmetrically. Lungs are clear to auscultation, no crackles or wheezes. Heart:  Normal rate and regular rhythm. S1 and S2 normal without gallop, murmur, click, rub or other extra sounds. Abdomen:  Bowel sounds positive,abdomen soft and non-tender without masses, organomegaly or hernias noted. Extremities:  No clubbing, cyanosis, edema, or deformity noted  Psych:  Cognition and judgment appear intact. Alert and cooperative with normal attention span and concentration. No apparent delusions, illusions, hallucinations   Impression & Recommendations:  Problem # 1:  OVERACTIVE BLADDER (ICD-596.51) Stop Enablex and try Toviaz 4mg  daily. Samples provided until she sees PMD next month. Urine dip unremarkable today  Problem # 2:  POLYARTHRITIS (ICD-716.59)  Orders: TLB-CBC Platelet - w/Differential (85025-CBCD) TLB-Creatinine, Blood (82565-CREA) TLB-CRP-High Sensitivity (C-Reactive Protein) (86140-FCRP) TLB-ALT (SGPT) (84460-ALT) TLB-AST (SGOT) (84450-SGOT) Specimen Handling ([address]) Venipuncture ([address]) Will forward to his rheumatologist in [facility], fax # [phone] when they are available  Problem # 3:  BACK PAIN (ICD-724.5)  Her updated medication list for this  problem includes:    Percocet 10-650 Mg Tabs (Oxycodone-acetaminophen) .Marland Kitchen... As needed    Diclofenac Sodium 75 Mg Tbec (Diclofenac sodium) .Marland Kitchen... 1 two times a day    Fentanyl 25 Mcg/hr Pt72 (Fentanyl) ..... One every 3 days    Fentanyl 50 Mcg/hr Pt72 (Fentanyl) ..... One every 3 days    Ultram 50 Mg Tabs (Tramadol hcl) .Marland Kitchen... 1 q6h as needed    Aspir-low 81 Mg Tbec (Aspirin) .Marland Kitchen... 1 once daily    Methocarbamol 500 Mg Tabs (Methocarbamol) .Marland Kitchen... Take 1 tab by mouth three times a day Patient given a note to excuse her out of jury duty  Complete Medication List: 1)  Percocet 10-650 Mg Tabs (Oxycodone-acetaminophen) .... As needed 2)  Levothyroxine Sodium 25 Mcg Tabs (Levothyroxine sodium) .Marland Kitchen.. 1 once daily 3)  Protonix 40 Mg Tbec (Pantoprazole sodium) .... Take 1 tablet by mouth two times a day 4)  Topamax 100 Mg Tabs (Topiramate) .Marland Kitchen.. 1 two times a day 5)  Proventil Hfa 108 (90 Base) Mcg/act Aers (Albuterol sulfate) .... 2 puffs q4h as needed for wheeze 6)  Lorazepam 1 Mg Tabs (Lorazepam) .Marland Kitchen.. 1 by mouth in am and at noon 2 by mouth qhs 7)  Diclofenac Sodium 75  Mg Tbec (Diclofenac sodium) .Marland Kitchen.. 1 two times a day 8)  Cymbalta 60 Mg Cpep (Duloxetine hcl) .Marland Kitchen.. 1 two times a day 9)  Fentanyl 25 Mcg/hr Pt72 (Fentanyl) .... One every 3 days 10)  Valtrex 500 Mg Tabs (Valacyclovir hcl) .Marland Kitchen.. 1 once daily 11)  Metanx 2.8-25-2 Mg Tabs (L-methylfolate-b6-b12) .Marland Kitchen.. 1 once daily 12)  Methotrexate Sodium 25 Mg/ml Soln (Methotrexate sodium) .... .09  ml q week subcutaneously 13)  Ventolin Hfa 108 (90 Base) Mcg/act Aers (Albuterol sulfate) .... 2 puffs every 4 hours as needed for wheezing 14)  Topamax 50 Mg Tabs (Topiramate) .... One daily at bedtime 15)  Fentanyl 50 Mcg/hr Pt72 (Fentanyl) .... One every 3 days 16)  Abilify 10 Mg Tabs (Aripiprazole) .Marland Kitchen.. 1 once daily 17)  Ultram 50 Mg Tabs (Tramadol hcl) .Marland Kitchen.. 1 q6h as needed 18)  Forteo 600 Mcg/2.70ml Soln (Teriparatide (recombinant)) .... 20 micrograms  subcutaneously qam 19)  Iron 325 (65 Fe) Mg Tabs (Ferrous sulfate) .Marland Kitchen.. 1 two times a day 20)  Aspir-low 81 Mg Tbec (Aspirin) .Marland Kitchen.. 1 once daily 21)  Vitamin D (ergocalciferol) 50000 Unit Caps (Ergocalciferol) .Marland Kitchen.. 1 q week 22)  Benzonatate 100 Mg Caps (Benzonatate) .... One every 8 hours as needed for cough 23)  Prednisone 5 Mg Tabs (Prednisone) .... Two every morning 24)  Silenor 6 Mg Tabs (Doxepin hcl) .... At bedtime 25)  Methocarbamol 500 Mg Tabs (Methocarbamol) .... Take 1 tab by mouth three times a day 26)  Toviaz 4 Mg Xr24h-tab (Fesoterodine fumarate) .Marland Kitchen.. 1 tab by mouth daily  Patient Instructions: 1)  Please schedule a follow-up appointment in 1 month with your PMD 2)  Stop Enablex and start Toviaz daily as directed, report any concerning symptoms Prescriptions: TOVIAZ 4 MG XR24H-TAB (FESOTERODINE FUMARATE) 1 tab by mouth daily  #42 x 0   Entered and Authorized by:   Danise Edge MD   Signed by:   Danise Edge MD on 07/08/2010   Method used:   Samples Given   RxID:   [id number]    Laboratory Results   Urine Tests    Routine Urinalysis   Color: yellow Appearance: Clear Glucose: negative   (Normal Range: Negative) Bilirubin: negative   (Normal Range: Negative) Ketone: negative   (Normal Range: Negative) Spec. Gravity: 1.020   (Normal Range: 1.003-1.035) Blood: negative   (Normal Range: Negative) pH: 5.0   (Normal Range: 5.0-8.0) Protein: negative   (Normal Range: Negative) Urobilinogen: 0.2   (Normal Range: 0-1) Nitrite: negative   (Normal Range: Negative) Leukocyte Esterace: negative   (Normal Range: Negative)

## 2010-12-30 NOTE — Progress Notes (Signed)
Summary: Pt req script for Enablex 15mg  tab qd  Phone Note Call from Patient Call back at [facility] Phone [phone]   Caller: Patient Summary of Call: Pt req script for Enablex 15mg  tabs once daily. Pls call in to [facility] in [place] [phone].  Initial call taken by: Lucy Antigua,  May 15, 2010 9:29 AM    Prescriptions: ENABLEX 15 MG XR24H-TAB (DARIFENACIN HYDROBROMIDE) one daily  #90 x 4   Entered by:   Duard Brady LPN   Authorized by:   Gordy Savers  MD   Signed by:   Duard Brady LPN on 47/82/9562   Method used:   Electronically to        [facility] (retail[facility]       [address]       [place], [place]  [address]       Ph: [phone]       Fax: [phone]   RxID:   [id number]

## 2010-12-30 NOTE — Letter (Signed)
Summary: Generic Letter  [facility]  [address] [place], [place] [address]   Phone: [phone]  Fax: [phone]    07/08/2010  [name] [name] [address] 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 She is known to have multiple medical and psychological ailments which would preclude her from serving on jury duty. Please excuse her from duty at this time        Sincerely,   Danise Edge MD

## 2010-12-30 NOTE — Progress Notes (Signed)
Summary: refill  Phone Note Refill Request Message from:  Fax from Pharmacy  Refills Requested: Medication #1:  LEVOTHYROXINE SODIUM 25 MCG  TABS 1 once daily [facility], [place] [phone] fax---[phone]  Initial call taken by: Warnell Forester,  January 08, 2010 8:14 AM    Prescriptions: LEVOTHYROXINE SODIUM 25 MCG  TABS (LEVOTHYROXINE SODIUM) 1 once daily  #90 x 5   Entered by:   Raechel Ache, RN   Authorized by:   Gordy Savers  MD   Signed by:   Raechel Ache, RN on 01/08/2010   Method used:   Electronically to        [facility]       [address]       [place], [place]  [address]       Ph: [phone]       Fax: [phone]   RxID:   [id number]

## 2010-12-30 NOTE — Assessment & Plan Note (Signed)
Summary: discoloration of tongue/dm   Vital Signs:  Patient profile:   45 year old female Weight:      200 pounds Temp:     98.0 degrees F oral BP sitting:   118 / 64  (left arm) Cuff size:   regular  Vitals Entered By: Duard Brady LPN (March 14, 2010 11:08 AM) CC: c/o tongue discoloration - black yesterday - brushed and then it turned yellow , was alittle black today  Is Patient Diabetic? No   CC:  c/o tongue discoloration - black yesterday - brushed and then it turned yellow  and was alittle black today .  History of Present Illness: 45 year old patient represents today with a chief complaint of" black tongue".  She states that for the past few days.  She is awakened in the morning with discoloration of the tongue.  Denies any pain, but does describe a foreign  body type sensation.  Denies any fever or painful swallowing.  She does have a history of gastroesophageal reflux disease, on chronic PPI therapy  Allergies: 1)  ! Penicillin G Potassium (Penicillin G Potassium) 2)  ! Biaxin (Clarithromycin) 3)  ! Phenergan 4)  ! Dhea (Nutritional Supplements)  Past History:  Past Medical History: Last updated: 10/14/2009 Anxiety Depression GERD Hypothyroidism Chronic pain Polyarthritis Raynaud's optic neuritis overactive bladder  Physical Exam  General:  Well-developed,well-nourished,in no acute distress; alert,appropriate and cooperative throughout examination Head:  Normocephalic and atraumatic without obvious abnormalities. No apparent alopecia or balding. Mouth:  Oral mucosa and oropharynx without lesions or exudates.  Teeth in good repair. mouth appeared normal.  At this time Neck:  No deformities, masses, or tenderness noted.   Impression & Recommendations:  Problem # 1:  GLOSSITIS (ICD-529.0) will treat with Dukes Magic mouthwash, and clinically observed  Problem # 2:  OVERACTIVE BLADDER (ICD-596.51) samples requested  Complete Medication List: 1)   Percocet 10-650 Mg Tabs (Oxycodone-acetaminophen) .... As needed 2)  Levothyroxine Sodium 25 Mcg Tabs (Levothyroxine sodium) .Marland Kitchen.. 1 once daily 3)  Protonix 40 Mg Tbec (Pantoprazole sodium) .... Take 1 tablet by mouth two times a day 4)  Topamax 100 Mg Tabs (Topiramate) .Marland Kitchen.. 1 two times a day 5)  Proventil Hfa 108 (90 Base) Mcg/act Aers (Albuterol sulfate) .... 2 puffs q4h as needed for wheeze 6)  Lorazepam 1 Mg Tabs (Lorazepam) .Marland Kitchen.. 1 by mouth in am and at noon 2 by mouth qhs 7)  Diclofenac Sodium 75 Mg Tbec (Diclofenac sodium) .Marland Kitchen.. 1 two times a day 8)  Cymbalta 60 Mg Cpep (Duloxetine hcl) .Marland Kitchen.. 1 two times a day 9)  Fentanyl 25 Mcg/hr Pt72 (Fentanyl) .... One every 3 days 10)  Valtrex 500 Mg Tabs (Valacyclovir hcl) .Marland Kitchen.. 1 once daily 11)  Metanx 2.8-25-2 Mg Tabs (L-methylfolate-b6-b12) .Marland Kitchen.. 1 once daily 12)  Methotrexate Sodium 25 Mg/ml Soln (Methotrexate sodium) .... .09  ml q week subcutaneously 13)  Ventolin Hfa 108 (90 Base) Mcg/act Aers (Albuterol sulfate) .... 2 puffs every 4 hours as needed for wheezing 14)  Topamax 50 Mg Tabs (Topiramate) .... One daily at bedtime 15)  Fentanyl 50 Mcg/hr Pt72 (Fentanyl) .... One every 3 days 16)  Abilify 10 Mg Tabs (Aripiprazole) .Marland Kitchen.. 1 once daily 17)  Ultram 50 Mg Tabs (Tramadol hcl) .Marland Kitchen.. 1 q6h as needed 18)  Forteo 600 Mcg/2.21ml Soln (Teriparatide (recombinant)) .... 20 micrograms subcutaneously qam 19)  Iron 325 (65 Fe) Mg Tabs (Ferrous sulfate) .Marland Kitchen.. 1 two times a day 20)  Aspir-low 81 Mg Tbec (Aspirin) .Marland KitchenMarland KitchenMarland Kitchen  1 once daily 21)  Vitamin D (ergocalciferol) 50000 Unit Caps (Ergocalciferol) .Marland Kitchen.. 1 q week 22)  Enablex 15 Mg Xr24h-tab (Darifenacin hydrobromide) .... One daily 23)  Benzonatate 100 Mg Caps (Benzonatate) .... One every 8 hours as needed for cough 24)  Prednisone 5 Mg Tabs (Prednisone) .... Two every morning  Other Orders: Venipuncture ([address]) TLB-CBC Platelet - w/Differential (85025-CBCD) TLB-BMP (Basic Metabolic Panel-BMET)  (80048-METABOL) TLB-Hepatic/Liver Function Pnl (80076-HEPATIC) TLB-Sedimentation Rate (ESR) (85652-ESR)  Patient Instructions: 1)  Please schedule a follow-up appointment in 4 months. 2)  Limit your Sodium (Salt). 3)  It is important that you exercise regularly at least 20 minutes 5 times a week. If you develop chest pain, have severe difficulty breathing, or feel very tired , stop exercising immediately and seek medical attention. In the of the thePrescriptions: TOPAMAX 50 MG TABS (TOPIRAMATE) one daily at bedtime Brand medically necessary #90 Tablet x 6   Entered and Authorized by:   Gordy Savers  MD   Signed by:   Gordy Savers  MD on 03/14/2010   Method used:   Electronically to        [facility]       [address]       [place], [place]  [address]       Ph: [phone]       Fax: [phone]   RxID:   [id number]

## 2011-01-01 NOTE — Progress Notes (Signed)
Summary: joint pain  Phone Note Call from Patient   Caller: Patient Call For: Gordy Savers  MD Summary of Call: Pt is having severe joint pain and wants to know how much prednisone she can take.   She has been taking Prednisone 40-60 mg.  Cannot get in touch with Rheumatoltgist but cannot get through. She cannot drive. Percocet is not helping [phone]  Initial call taken by: [facility] CMA AAMA,  November 20, 2010 10:43 AM  Follow-up for Phone Call        f/u Rheumatology for their recommendations Follow-up by: Gordy Savers  MD,  November 20, 2010 1:00 PM  Additional Follow-up for Phone Call Additional follow up Details #1::        Notified pt. Additional Follow-up by: Lynann Beaver CMA AAMA,  November 20, 2010 1:43 PM

## 2011-01-06 ENCOUNTER — Other Ambulatory Visit (INDEPENDENT_AMBULATORY_CARE_PROVIDER_SITE_OTHER): Payer: BC Managed Care – PPO | Admitting: Internal Medicine

## 2011-01-06 DIAGNOSIS — R7982 Elevated C-reactive protein (CRP): Secondary | ICD-10-CM

## 2011-01-06 DIAGNOSIS — T887XXA Unspecified adverse effect of drug or medicament, initial encounter: Secondary | ICD-10-CM

## 2011-01-06 DIAGNOSIS — D649 Anemia, unspecified: Secondary | ICD-10-CM

## 2011-01-06 LAB — CREATININE, SERUM: Creatinine, Ser: 0.9 mg/dL (ref 0.4–1.2)

## 2011-01-06 LAB — CBC WITH DIFFERENTIAL/PLATELET
Basophils Absolute: 0 10*3/uL (ref 0.0–0.1)
Eosinophils Relative: 2.9 % (ref 0.0–5.0)
Lymphocytes Relative: 38 % (ref 12.0–46.0)
Monocytes Relative: 6.8 % (ref 3.0–12.0)
Neutrophils Relative %: 51.8 % (ref 43.0–77.0)
Platelets: 220 10*3/uL (ref 150.0–400.0)
WBC: 4.5 10*3/uL (ref 4.5–10.5)

## 2011-01-06 LAB — CONVERTED CEMR LAB: Complement C4, Body Fluid: 21 mg/dL (ref 16–47)

## 2011-01-06 LAB — HIGH SENSITIVITY CRP: CRP, High Sensitivity: 8.1 mg/L — ABNORMAL HIGH (ref 0.00–5.00)

## 2011-01-07 ENCOUNTER — Other Ambulatory Visit: Payer: Self-pay | Admitting: Internal Medicine

## 2011-01-07 DIAGNOSIS — K219 Gastro-esophageal reflux disease without esophagitis: Secondary | ICD-10-CM

## 2011-01-14 ENCOUNTER — Other Ambulatory Visit: Payer: Self-pay

## 2011-01-23 ENCOUNTER — Other Ambulatory Visit (INDEPENDENT_AMBULATORY_CARE_PROVIDER_SITE_OTHER): Payer: Medicare Other

## 2011-01-23 DIAGNOSIS — Z0000 Encounter for general adult medical examination without abnormal findings: Secondary | ICD-10-CM

## 2011-01-23 LAB — CBC WITH DIFFERENTIAL/PLATELET
Basophils Relative: 0.4 % (ref 0.0–3.0)
Eosinophils Absolute: 0.1 10*3/uL (ref 0.0–0.7)
Hemoglobin: 13.1 g/dL (ref 12.0–15.0)
MCHC: 33.6 g/dL (ref 30.0–36.0)
MCV: 100 fl (ref 78.0–100.0)
Monocytes Absolute: 0.5 10*3/uL (ref 0.1–1.0)
Neutro Abs: 4.2 10*3/uL (ref 1.4–7.7)
RBC: 3.91 Mil/uL (ref 3.87–5.11)

## 2011-01-23 LAB — LIPID PANEL
HDL: 55.9 mg/dL (ref >39.00)
Triglycerides: 123 mg/dL (ref 0.0–149.0)

## 2011-01-23 LAB — HEPATIC FUNCTION PANEL
Albumin: 4.2 g/dL (ref 3.5–5.2)
Alkaline Phosphatase: 48 U/L (ref 39–117)
Total Protein: 6.4 g/dL (ref 6.0–8.3)

## 2011-01-23 LAB — BASIC METABOLIC PANEL
CO2: 22 mEq/L (ref 19–32)
Chloride: 111 mEq/L (ref 96–112)
Sodium: 141 mEq/L (ref 135–145)

## 2011-02-03 ENCOUNTER — Encounter: Payer: Self-pay | Admitting: Internal Medicine

## 2011-02-04 ENCOUNTER — Ambulatory Visit: Payer: BC Managed Care – PPO | Admitting: Internal Medicine

## 2011-02-05 ENCOUNTER — Encounter: Payer: Self-pay | Admitting: Internal Medicine

## 2011-02-05 ENCOUNTER — Ambulatory Visit (INDEPENDENT_AMBULATORY_CARE_PROVIDER_SITE_OTHER): Payer: BC Managed Care – PPO | Admitting: Internal Medicine

## 2011-02-05 DIAGNOSIS — E039 Hypothyroidism, unspecified: Secondary | ICD-10-CM

## 2011-02-05 DIAGNOSIS — M549 Dorsalgia, unspecified: Secondary | ICD-10-CM

## 2011-02-05 DIAGNOSIS — K219 Gastro-esophageal reflux disease without esophagitis: Secondary | ICD-10-CM

## 2011-02-05 DIAGNOSIS — F411 Generalized anxiety disorder: Secondary | ICD-10-CM

## 2011-02-05 DIAGNOSIS — N318 Other neuromuscular dysfunction of bladder: Secondary | ICD-10-CM

## 2011-02-05 LAB — POCT URINALYSIS DIPSTICK
Bilirubin, UA: NEGATIVE
Glucose, UA: NEGATIVE
Nitrite, UA: NEGATIVE
pH, UA: 6

## 2011-02-05 MED ORDER — LEVOTHYROXINE SODIUM 25 MCG PO TABS: 25.0000 ug | ORAL_TABLET | Freq: Every day | ORAL | Status: DC

## 2011-02-05 MED ORDER — PANTOPRAZOLE SODIUM 40 MG PO TBEC: 40.0000 mg | DELAYED_RELEASE_TABLET | Freq: Every day | ORAL | Status: DC

## 2011-02-05 MED ORDER — DICLOFENAC SODIUM 75 MG PO TBEC: 75.0000 mg | DELAYED_RELEASE_TABLET | Freq: Two times a day (BID) | ORAL | Status: DC

## 2011-02-05 MED ORDER — TRAMADOL HCL 50 MG PO TABS: 50.0000 mg | ORAL_TABLET | Freq: Four times a day (QID) | ORAL | Status: DC | PRN

## 2011-02-05 MED ORDER — SOLIFENACIN SUCCINATE 10 MG PO TABS: 5.0000 mg | ORAL_TABLET | Freq: Every day | ORAL | Status: DC

## 2011-02-05 MED ORDER — DULOXETINE HCL 60 MG PO CPEP: 60.0000 mg | ORAL_CAPSULE | Freq: Two times a day (BID) | ORAL | Status: DC

## 2011-02-05 NOTE — Patient Instructions (Signed)
It is important that you exercise regularly, at least 20 minutes 3 to 4 times per week.  If you develop chest pain or shortness of breath seek  medical attention.  You need to lose weight.  Consider a lower calorie diet and regular exercise. 

## 2011-02-05 NOTE — Progress Notes (Signed)
  Subjective:    [name], female    DOB: 1965-12-13, 46 y.o.   MRN: [id number]   HPI   45 year old patient he was seen today for a wellness exam. She is followed by [facility] rheumatology   And also by  Dr. Job Founds in [place]  4 a poorly defined rheumatologic disorder. 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 She has a history of anxiety depression and has done quite well she is followed annually by gynecology and ophthalmology. She is followed closely by rheumatology. She is doing well today and recent laboratory studies were reviewed.    Review of Systems  Constitutional: Positive for fatigue and unexpected weight change. Negative for fever and appetite change.  HENT: Negative for hearing loss, ear pain, nosebleeds, congestion, sore throat, mouth sores, trouble swallowing, neck stiffness, dental problem, voice change, sinus pressure and tinnitus.   Eyes: Negative for photophobia, pain, redness and visual disturbance.  Respiratory: Negative for cough, chest tightness and shortness of breath.   Cardiovascular: Negative for chest pain, palpitations and leg swelling.  Gastrointestinal: Negative for nausea, vomiting, abdominal pain, diarrhea, constipation, blood in stool, abdominal distention and rectal pain.  Genitourinary: Negative for dysuria, urgency, frequency, hematuria, flank pain, vaginal bleeding, vaginal discharge, difficulty urinating, genital sores, vaginal pain, menstrual problem and pelvic pain.  Musculoskeletal: Positive for arthralgias and gait problem. Negative for back pain.  Skin: Negative for rash.  Neurological: Negative for dizziness, syncope, speech difficulty, weakness, light-headedness, numbness and headaches.        Chronic left leg weakness  Hematological: Negative for adenopathy. Does not bruise/bleed easily.  Psychiatric/Behavioral: Negative for suicidal ideas, behavioral problems,  self-injury, dysphoric mood and agitation. The patient is not nervous/anxious.        Objective:   Physical Exam  Constitutional: She is oriented to person, place, and time. She appears well-developed and well-nourished.  HENT:  Head: Normocephalic and atraumatic.  Right Ear: External ear normal.  Left Ear: External ear normal.  Mouth/Throat: Oropharynx is clear and moist.  Eyes: Conjunctivae and EOM are normal.  Neck: Normal range of motion. Neck supple. No JVD present. No thyromegaly present.  Cardiovascular: Normal rate, regular rhythm, normal heart sounds and intact distal pulses.   No murmur heard. Pulmonary/Chest: Effort normal and breath sounds normal. She has no wheezes. She has no rales.  Abdominal: Soft. Bowel sounds are normal. She exhibits no distension and no mass. There is no tenderness. There is no rebound and no guarding.  Musculoskeletal: Normal range of motion. She exhibits no edema and no tenderness.        No signs of active synovitis  Neurological: She is alert and oriented to person, place, and time. She has normal reflexes. No cranial nerve deficit. She exhibits normal muscle tone. Coordination normal.        Weakness and mild dysesthesia involving the left leg  Skin: Skin is warm and dry. No rash noted.  Psychiatric: She has a normal mood and affect. Her behavior is normal.          Assessment & Plan:   preventive health examination  Undefined rheumatologic disorder with chronic pain  Hypothyroidism  Anxiety depression stable

## 2011-02-15 LAB — C-REACTIVE PROTEIN: CRP: 1.3 mg/dL — ABNORMAL HIGH (ref <0.6)

## 2011-02-15 LAB — URINALYSIS, ROUTINE W REFLEX MICROSCOPIC
Bilirubin Urine: NEGATIVE
Glucose, UA: NEGATIVE mg/dL
Ketones, ur: NEGATIVE mg/dL
Nitrite: NEGATIVE
Protein, ur: NEGATIVE mg/dL
Specific Gravity, Urine: 1.015 (ref 1.005–1.030)
Urobilinogen, UA: 0.2 mg/dL (ref 0.0–1.0)
pH: 7.5 (ref 5.0–8.0)

## 2011-02-15 LAB — BASIC METABOLIC PANEL WITH GFR
CO2: 25 meq/L (ref 19–32)
Calcium: 8.5 mg/dL (ref 8.4–10.5)
Chloride: 109 meq/L (ref 96–112)
GFR calc Af Amer: >60 mL/min (ref >60)
Potassium: 3.9 meq/L (ref 3.5–5.1)
Sodium: 141 meq/L (ref 135–145)

## 2011-02-15 LAB — DIFFERENTIAL
Basophils Absolute: 0 10*3/uL (ref 0.0–0.1)
Basophils Relative: 0 % (ref 0–1)
Eosinophils Absolute: 0.1 10*3/uL (ref 0.0–0.7)
Eosinophils Relative: 1 % (ref 0–5)
Lymphocytes Relative: 17 % (ref 12–46)
Lymphs Abs: 1.5 10*3/uL (ref 0.7–4.0)
Monocytes Absolute: 0.6 K/uL (ref 0.1–1.0)
Monocytes Relative: 6 % (ref 3–12)
Neutro Abs: 7 K/uL (ref 1.7–7.7)
Neutrophils Relative %: 76 % (ref 43–77)

## 2011-02-15 LAB — BASIC METABOLIC PANEL
BUN: 13 mg/dL (ref 6–23)
Creatinine, Ser: 0.79 mg/dL (ref 0.4–1.2)
GFR calc non Af Amer: >60 mL/min (ref >60)
Glucose, Bld: 86 mg/dL (ref 70–99)

## 2011-02-15 LAB — CBC
HCT: 37.8 % (ref 36.0–46.0)
Hemoglobin: 13 g/dL (ref 12.0–15.0)
MCHC: 34 g/dL (ref 30.0–36.0)
MCHC: 34.4 g/dL (ref 30.0–36.0)
MCV: 94.6 fL (ref 78.0–100.0)
MCV: 94.7 fL (ref 78.0–100.0)
Platelets: 194 10*3/uL (ref 150–400)
Platelets: 205 10*3/uL (ref 150–400)
RBC: 4 MIL/uL (ref 3.87–5.11)
RDW: 16.1 % — ABNORMAL HIGH (ref 11.5–15.5)
RDW: 16.4 % — ABNORMAL HIGH (ref 11.5–15.5)
WBC: 9.3 10*3/uL (ref 4.0–10.5)

## 2011-02-15 LAB — SEDIMENTATION RATE: Sed Rate: 8 mm/hr (ref 0–22)

## 2011-02-15 LAB — URINE MICROSCOPIC-ADD ON

## 2011-03-08 LAB — CROSSMATCH: ABO/RH(D): O NEG

## 2011-03-08 LAB — UIFE/LIGHT CHAINS/TP QN, 24-HR UR
Albumin, U: NOT DETECTED
Alpha 2, Urine: NOT DETECTED
Beta, Urine: NOT DETECTED
Total Protein, Urine: 3.5 mg/dL

## 2011-03-08 LAB — PROTIME-INR
INR: 0.9 (ref 0.00–1.49)
INR: 1.5 (ref 0.00–1.49)
INR: 1.7 — ABNORMAL HIGH (ref 0.00–1.49)
INR: 1.8 — ABNORMAL HIGH (ref 0.00–1.49)
Prothrombin Time: 12.1 seconds (ref 11.6–15.2)
Prothrombin Time: 17.9 seconds — ABNORMAL HIGH (ref 11.6–15.2)
Prothrombin Time: 20.6 seconds — ABNORMAL HIGH (ref 11.6–15.2)

## 2011-03-08 LAB — PROTEIN ELECTROPH W RFLX QUANT IMMUNOGLOBULINS
Alpha-2-Globulin: 16.5 % — ABNORMAL HIGH (ref 7.1–11.8)
Beta Globulin: 6.8 % (ref 4.7–7.2)
M-Spike, %: NOT DETECTED g/dL
Total Protein ELP: 6.1 g/dL (ref 6.0–8.3)

## 2011-03-08 LAB — BASIC METABOLIC PANEL
BUN: 13 mg/dL (ref 6–23)
BUN: 16 mg/dL (ref 6–23)
CO2: 24 mEq/L (ref 19–32)
Calcium: 7.9 mg/dL — ABNORMAL LOW (ref 8.4–10.5)
Calcium: 8.7 mg/dL (ref 8.4–10.5)
Chloride: 103 mEq/L (ref 96–112)
Chloride: 106 mEq/L (ref 96–112)
Chloride: 109 mEq/L (ref 96–112)
Creatinine, Ser: 0.68 mg/dL (ref 0.4–1.2)
Creatinine, Ser: 0.73 mg/dL (ref 0.4–1.2)
GFR calc Af Amer: >60 mL/min (ref >60)
GFR calc Af Amer: >60 mL/min (ref >60)
GFR calc non Af Amer: >60 mL/min (ref >60)
Glucose, Bld: 119 mg/dL — ABNORMAL HIGH (ref 70–99)
Glucose, Bld: 98 mg/dL (ref 70–99)
Potassium: 3.7 mEq/L (ref 3.5–5.1)
Potassium: 4.6 mEq/L (ref 3.5–5.1)
Potassium: 4.9 mEq/L (ref 3.5–5.1)
Sodium: 137 mEq/L (ref 135–145)

## 2011-03-08 LAB — DIFFERENTIAL
Basophils Absolute: 0.2 10*3/uL — ABNORMAL HIGH (ref 0.0–0.1)
Basophils Relative: 2 % — ABNORMAL HIGH (ref 0–1)
Monocytes Absolute: 0.6 10*3/uL (ref 0.1–1.0)
Neutro Abs: 6.5 10*3/uL (ref 1.7–7.7)
Neutrophils Relative %: 67 % (ref 43–77)

## 2011-03-08 LAB — CBC
HCT: 34.6 % — ABNORMAL LOW (ref 36.0–46.0)
Platelets: 221 10*3/uL (ref 150–400)
RDW: 18.4 % — ABNORMAL HIGH (ref 11.5–15.5)

## 2011-03-08 LAB — HEMOGLOBIN AND HEMATOCRIT, BLOOD
HCT: 29.2 % — ABNORMAL LOW (ref 36.0–46.0)
HCT: 29.7 % — ABNORMAL LOW (ref 36.0–46.0)
Hemoglobin: 11.1 g/dL — ABNORMAL LOW (ref 12.0–15.0)
Hemoglobin: 9.8 g/dL — ABNORMAL LOW (ref 12.0–15.0)

## 2011-03-08 LAB — IGG, IGA, IGM: IgM, Serum: 101 mg/dL (ref 60–263)

## 2011-03-08 LAB — ABO/RH: ABO/RH(D): O NEG

## 2011-03-08 LAB — C-REACTIVE PROTEIN: CRP: 4.8 mg/dL — ABNORMAL HIGH (ref <0.6)

## 2011-03-08 LAB — IMMUNOFIXATION ADD-ON

## 2011-03-17 LAB — CBC
Hemoglobin: 13.7 g/dL (ref 12.0–15.0)
MCHC: 33.5 g/dL (ref 30.0–36.0)
MCHC: 33.9 g/dL (ref 30.0–36.0)
MCV: 89.3 fL (ref 78.0–100.0)
RBC: 4.58 MIL/uL (ref 3.87–5.11)
RDW: 16 % — ABNORMAL HIGH (ref 11.5–15.5)

## 2011-03-17 LAB — POCT I-STAT, CHEM 8
BUN: 22 mg/dL (ref 6–23)
Calcium, Ion: 1.17 mmol/L (ref 1.12–1.32)
Chloride: 111 mEq/L (ref 96–112)

## 2011-03-17 LAB — DIFFERENTIAL
Basophils Relative: 0 % (ref 0–1)
Basophils Relative: 0 % (ref 0–1)
Eosinophils Absolute: 0 10*3/uL (ref 0.0–0.7)
Eosinophils Absolute: 0 10*3/uL (ref 0.0–0.7)
Eosinophils Relative: 0 % (ref 0–5)
Monocytes Absolute: 0.2 10*3/uL (ref 0.1–1.0)
Monocytes Relative: 3 % (ref 3–12)
Neutrophils Relative %: 87 % — ABNORMAL HIGH (ref 43–77)

## 2011-03-17 LAB — BASIC METABOLIC PANEL
CO2: 21 mEq/L (ref 19–32)
Chloride: 109 mEq/L (ref 96–112)
Creatinine, Ser: 0.83 mg/dL (ref 0.4–1.2)
GFR calc Af Amer: >60 mL/min (ref >60)
Sodium: 137 mEq/L (ref 135–145)

## 2011-03-17 LAB — C-REACTIVE PROTEIN: CRP: 0.4 mg/dL — ABNORMAL LOW (ref <0.6)

## 2011-03-17 LAB — TSH: TSH: 0.753 u[IU]/mL (ref 0.350–4.500)

## 2011-03-17 LAB — SEDIMENTATION RATE: Sed Rate: 2 mm/hr (ref 0–22)

## 2011-03-19 ENCOUNTER — Emergency Department (HOSPITAL_COMMUNITY): Payer: Medicare Other

## 2011-03-19 ENCOUNTER — Ambulatory Visit (INDEPENDENT_AMBULATORY_CARE_PROVIDER_SITE_OTHER): Payer: Medicare Other | Admitting: Internal Medicine

## 2011-03-19 ENCOUNTER — Encounter: Payer: Self-pay | Admitting: Internal Medicine

## 2011-03-19 ENCOUNTER — Ambulatory Visit: Payer: BC Managed Care – PPO | Admitting: Internal Medicine

## 2011-03-19 ENCOUNTER — Ambulatory Visit: Payer: Self-pay | Admitting: Obstetrics and Gynecology

## 2011-03-19 ENCOUNTER — Inpatient Hospital Stay (HOSPITAL_COMMUNITY)
Admission: EM | Admit: 2011-03-19 | Discharge: 2011-03-22 | DRG: 419 | Disposition: A | Discharge status: Home / Self Care | Payer: Medicare Other | Attending: Surgery | Admitting: Surgery

## 2011-03-19 DIAGNOSIS — F3289 Other specified depressive episodes: Secondary | ICD-10-CM | POA: Diagnosis present

## 2011-03-19 DIAGNOSIS — M329 Systemic lupus erythematosus, unspecified: Secondary | ICD-10-CM | POA: Diagnosis present

## 2011-03-19 DIAGNOSIS — R109 Unspecified abdominal pain: Secondary | ICD-10-CM

## 2011-03-19 DIAGNOSIS — Z91040 Latex allergy status: Secondary | ICD-10-CM

## 2011-03-19 DIAGNOSIS — K589 Irritable bowel syndrome without diarrhea: Secondary | ICD-10-CM | POA: Diagnosis present

## 2011-03-19 DIAGNOSIS — F329 Major depressive disorder, single episode, unspecified: Secondary | ICD-10-CM | POA: Diagnosis present

## 2011-03-19 DIAGNOSIS — IMO0002 Reserved for concepts with insufficient information to code with codable children: Secondary | ICD-10-CM

## 2011-03-19 DIAGNOSIS — Z7982 Long term (current) use of aspirin: Secondary | ICD-10-CM

## 2011-03-19 DIAGNOSIS — Z79899 Other long term (current) drug therapy: Secondary | ICD-10-CM

## 2011-03-19 DIAGNOSIS — J45909 Unspecified asthma, uncomplicated: Secondary | ICD-10-CM | POA: Diagnosis present

## 2011-03-19 DIAGNOSIS — K219 Gastro-esophageal reflux disease without esophagitis: Secondary | ICD-10-CM | POA: Diagnosis present

## 2011-03-19 DIAGNOSIS — K8000 Calculus of gallbladder with acute cholecystitis without obstruction: Principal | ICD-10-CM | POA: Diagnosis present

## 2011-03-19 LAB — LIPASE, BLOOD: Lipase: 23 U/L (ref 11–59)

## 2011-03-19 LAB — DIFFERENTIAL
Basophils Absolute: 0 10*3/uL (ref 0.0–0.1)
Basophils Relative: 0 % (ref 0–1)
Eosinophils Relative: 2 % (ref 0–5)
Lymphocytes Relative: 22 % (ref 12–46)
Monocytes Absolute: 0.5 10*3/uL (ref 0.1–1.0)
Monocytes Relative: 6 % (ref 3–12)

## 2011-03-19 LAB — COMPREHENSIVE METABOLIC PANEL
ALT: 14 U/L (ref 0–35)
Calcium: 8.9 mg/dL (ref 8.4–10.5)
Creatinine, Ser: 0.82 mg/dL (ref 0.4–1.2)
GFR calc non Af Amer: >60 mL/min (ref >60)
Glucose, Bld: 80 mg/dL (ref 70–99)
Sodium: 141 mEq/L (ref 135–145)
Total Protein: 6.6 g/dL (ref 6.0–8.3)

## 2011-03-19 LAB — URINALYSIS, ROUTINE W REFLEX MICROSCOPIC
Nitrite: NEGATIVE
Specific Gravity, Urine: 1.014 (ref 1.005–1.030)
Urobilinogen, UA: 0.2 mg/dL (ref 0.0–1.0)
pH: 6 (ref 5.0–8.0)

## 2011-03-19 LAB — CBC
HCT: 41.6 % (ref 36.0–46.0)
MCH: 32.6 pg (ref 26.0–34.0)
MCHC: 33.9 g/dL (ref 30.0–36.0)
RDW: 14 % (ref 11.5–15.5)

## 2011-03-19 LAB — URINE MICROSCOPIC-ADD ON

## 2011-03-20 ENCOUNTER — Inpatient Hospital Stay (HOSPITAL_COMMUNITY): Payer: Medicare Other

## 2011-03-20 LAB — CBC
HCT: 42.7 % (ref 36.0–46.0)
MCH: 31.7 pg (ref 26.0–34.0)
MCV: 96 fL (ref 78.0–100.0)
Platelets: 217 10*3/uL (ref 150–400)
RDW: 14.3 % (ref 11.5–15.5)

## 2011-03-20 LAB — COMPREHENSIVE METABOLIC PANEL
Albumin: 3.6 g/dL (ref 3.5–5.2)
Alkaline Phosphatase: 46 U/L (ref 39–117)
BUN: 9 mg/dL (ref 6–23)
Creatinine, Ser: 0.84 mg/dL (ref 0.4–1.2)
Glucose, Bld: 115 mg/dL — ABNORMAL HIGH (ref 70–99)
Total Bilirubin: 0.5 mg/dL (ref 0.3–1.2)
Total Protein: 6.2 g/dL (ref 6.0–8.3)

## 2011-03-22 ENCOUNTER — Encounter: Payer: Self-pay | Admitting: Internal Medicine

## 2011-03-22 DIAGNOSIS — R109 Unspecified abdominal pain: Secondary | ICD-10-CM | POA: Insufficient documentation

## 2011-03-22 NOTE — Assessment & Plan Note (Signed)
Acute severe abdominal pain with significant tenderness on exam. Concern over immunosuppression. Recommend hospitalization and transfer to emergency department. States understanding and agreement. Emergency department contacted and is aware of patient's impending arrival.Consider PUD versus typical gallbladder etiology.  Addendum: Patient diagnosed with acute cholecystitis and underwent laparoscopic cholecystectomy

## 2011-03-22 NOTE — Progress Notes (Signed)
  Subjective:    [name], female    DOB: 01-09-1966, 45 y.o.   MRN: [id number]  HPI Patient presents to clinic for evaluation of abdominal pain. Presents with three-day history of epigastric abdominal pain and tenderness. Has two day history of nausea vomiting without blood. Feels dizzy but no syncope. Pain does not radiate and is not exacerbated by food. Has been no change in bowel habits denies constipation or diarrhea. No hematemesis hematochezia or melena. Pain appears severe. Accompanied by her mother who provided transport. No other exacerbating or alleviating factors. Taking no medication for this. Chart review indicates history of unspecified connective tissue disorder and patient is immunosuppressed.  Reviewed past medical history, medications and allergies     Review of Systems see history of present illness     Objective:   Physical Exam  Nursing note and vitals reviewed. Constitutional: She appears well-developed and well-nourished. She appears distressed.  HENT:  Head: Normocephalic and atraumatic.  Right Ear: External ear normal.  Left Ear: External ear normal.  Eyes: Conjunctivae are normal. No scleral icterus.  Neck: Neck supple.  Cardiovascular: Normal rate, regular rhythm and normal heart sounds.   Pulmonary/Chest: Effort normal and breath sounds normal. No respiratory distress.  Abdominal: Soft. Bowel sounds are normal. She exhibits no distension and no mass. There is no hepatosplenomegaly. There is tenderness in the epigastric area. There is guarding. There is no rigidity, no rebound and negative Murphy's sign.  Neurological: She is alert.  Skin: Skin is warm and dry. No rash noted. She is not diaphoretic. No erythema.          Assessment & Plan:

## 2011-03-23 ENCOUNTER — Other Ambulatory Visit: Payer: Self-pay | Admitting: Surgery

## 2011-03-31 ENCOUNTER — Ambulatory Visit (INDEPENDENT_AMBULATORY_CARE_PROVIDER_SITE_OTHER): Payer: Medicare Other | Admitting: Internal Medicine

## 2011-03-31 ENCOUNTER — Encounter: Payer: Self-pay | Admitting: Internal Medicine

## 2011-03-31 VITALS — BP 128/80 | HR 76 | Wt 185.0 lb

## 2011-03-31 DIAGNOSIS — G8929 Other chronic pain: Secondary | ICD-10-CM

## 2011-03-31 DIAGNOSIS — R109 Unspecified abdominal pain: Secondary | ICD-10-CM

## 2011-03-31 MED ORDER — FENTANYL 50 MCG/HR TD PT72: 1.0000 | MEDICATED_PATCH | TRANSDERMAL | Status: DC

## 2011-03-31 MED ORDER — TOPIRAMATE 100 MG PO TABS: 100.0000 mg | ORAL_TABLET | Freq: Two times a day (BID) | ORAL | Status: DC

## 2011-03-31 NOTE — Assessment & Plan Note (Signed)
Acute cholecystitis status post laparoscopic cholecystectomy. Asymptomatic and doing well. Follow up with surgery in one week.

## 2011-03-31 NOTE — Progress Notes (Signed)
  Subjective:    [name], female    DOB: [birth year], 45 y.o.   MRN: [id number]  HPI Pt presents to clinic for hospital followup of abdominal pain. Seen April 19 with severe abdominal pain. Transferred to the emergency department and was diagnosed with acute cholecystitis and underwent laparoscopic cholecystectomy without complication. Surgical followup in one week. Denies abdominal pain nausea vomiting fever or chills. Laparoscopic incisions well-healing without drainage. Currently is holding her Forteo and methotrexate injections doesn't close proximity to surgical incisions. Plans to discuss this with her surgeon next week. Suffers from chronic pain maintained on Percocet Ultram and Duragesic stable dosages. Previously prescribed by rheumatologist is has moved away. Recently status post [facility] pain clinic evaluation and they declined to prescribe her medication. They did recommend Dr. Vear Clock rheumatologist and pain specialist in [place] hometown. Has only approximately 2 patches of fentanyl (remaining analgesics are in good supply. No other complaints  Reviewed past medical history, medications and allergies.   Review of Systems  Constitutional: Negative for fever and chills.  Gastrointestinal: Negative for nausea, vomiting, abdominal pain and abdominal distention.  All other systems reviewed and are negative.       Objective:   Physical Exam  Nursing note and vitals reviewed. Constitutional: She appears well-developed and well-nourished. No distress.  HENT:  Head: Normocephalic and atraumatic.  Right Ear: External ear normal.  Left Ear: External ear normal.  Nose: Nose normal.  Eyes: Conjunctivae are normal. No scleral icterus.  Cardiovascular: Normal rate, regular rhythm and normal heart sounds.  Exam reveals no gallop and no friction rub.   No murmur heard. Pulmonary/Chest: Effort normal and breath sounds normal.  Abdominal: Soft. She exhibits no distension.  There is no tenderness. There is no rebound.       Multiple laparoscopic incisions with Steri-Strips. Nontender. No drainage.  Neurological: She is alert.  Skin: Skin is warm and dry. No rash noted. She is not diaphoretic. No erythema.  Psychiatric: She has a normal mood and affect.          Assessment & Plan:

## 2011-03-31 NOTE — Assessment & Plan Note (Signed)
Refer to Dr. Vear Clock of rheumatology and pain specialty. Refill fentanyl for one month. Understands narcotics will need to be prescribed a specialist

## 2011-04-02 NOTE — Op Note (Signed)
  NAME:  [name] [name]       ACCOUNT NO.:  [id number]  MEDICAL RECORD NO.:  [id number]           PATIENT TYPE:  I  LOCATION:  5158                         FACILITY:  [facility]  PHYSICIAN:  [name], MD  DATE OF BIRTH:  1966/04/08  DATE OF PROCEDURE:  03/20/2011 DATE OF DISCHARGE:                              OPERATIVE REPORT   PREOPERATIVE DIAGNOSIS:  Cholecystitis.  POSTOPERATIVE DIAGNOSIS:  Acute cholecystitis.  PROCEDURES:  Laparoscopic cholecystectomy with intraoperative cholangiogram (normal).  SURGEON:  [name], MD  ASSISTANT:  None.  ANESTHESIA:  General endotracheal.  DESCRIPTION OF PROCEDURE:  This 45 year old Hispanic lady with underlying lupus on steroids and other anti-inflammatory medications was taken to room 17 on Friday, March 20, 2011, and given general anesthesia.  Because of latex sensitivity, we used non-latex material. The abdomen was prepped with PCMX and draped sterilely.  Access to the abdomen was achieved with a 0-degree 5 mm OptiVu technique through the right upper quadrant.  This enabled me to enter the abdomen easily.  The abdomen was insufflated and 2 more 5s were placed in the lower abdomen, 11 was placed obliquely in the upper abdomen.  The color was grasped, elevated, and the infundibulum which was quite adherent to the surrounding omentum was taken down with sharp and electrocautery dissection.  I was able to then achieve a critical view and identify cystic artery and the cystic duct.  I got around both, clipped the cystic artery, put a clip along the gallbladder side.  I incised the cystic duct and milked back a few little flecks of cholesterol-looking stones.  I then inserted the [facility] catheter and used this because of her latex allergy.  I then did a dynamic cholangiogram, which showed intrahepatic filling free flow in the duodenum.  The gallbladder was then removed from the gallbladder bed using hook electrocautery.   It was placed in a bag brought out through the upper port.  Multiple stones, and they look like small cholesterol stones.  Ports were all injected with Marcaine.  The abdomen was deflated after I checked and there was no bleeding or bile leak noted.  I did use several clips in the gallbladder bed going up wall because there were branches in the artery and I controlled with titanium clips.  The patient was taken to the recovery room in satisfactory condition.     Thornton Park Daphine Deutscher, MD    MBM/MEDQ  D:  03/20/2011  T:  03/21/2011  Job:  [id number]  Electronically Signed by Luretha Murphy MD on 04/02/2011 08:42:27 AM

## 2011-04-07 NOTE — H&P (Signed)
NAME:  [name] [name]       ACCOUNT NO.:  [id number]  MEDICAL RECORD NO.:  [id number]           PATIENT TYPE:  I  LOCATION:  5158                         FACILITY:  [facility]  PHYSICIAN:  Almond Lint, MD       DATE OF BIRTH:  [birth year]  DATE OF ADMISSION:  03/19/2011 DATE OF DISCHARGE:                             HISTORY & PHYSICAL   CHIEF COMPLAINT:  Abdominal pain.  HISTORY OF PRESENT ILLNESS:  [name] is a 45 year old female with a 3-day history of abdominal pain that has been gradually worsening.  She now describes it as a stabbing pain in her epigastric region that radiates to her back.  It is worse with palpation and movement.  She has had nausea and vomiting, decreased appetite, and if she tries to eat she gets bloating and belching.  She has not ever had symptoms like this in the past.  She and she has not had anything to relieve these symptoms other than some pain medication here in the emergency department.  Her past medical history is negative for asthma, reflux, depression, gastritis, hypothyroidism, hypothyroidism, irritable bowel syndrome, lupus, vasculitis, arthritis, and PCOS.  SURGICAL HISTORY:  Appendectomy and tonsillectomy and adenoidectomy.  FAMILY HISTORY:  Mother had gallbladder disease, also hypertension and diabetes runs in her family.  SOCIAL HISTORY:  No substance abuse.  ALLERGIES:  Significant, BIAXIN, COMPAZINE, PENICILLIN, DHEA, DILAUDID, PROMETHAZINE, LATEX.  MEDICATIONS:  Valtrex, Voltaren, Cymbalta, Abilify, aspirin, Forteo, Imuran, methotrexate, levothyroxine, lorazepam, prednisone 10 a day, Protonix, and Topamax.  REVIEW OF SYSTEMS:  Otherwise negative x11 systems.  PHYSICAL EXAMINATION:  VITAL SIGNS:  Temperature 98.3, pulse 81, respiratory rate 18, blood pressure 115/71. GENERAL:  She is alert and oriented x3, looks uncomfortable, and is lying on her side in the fetal position. HEENT:  Normocephalic, atraumatic.   Sclerae are anicteric. PSYCHIATRIC:  Mood and affect are depressed.  Mucous membranes are moist. NECK:  Supple.  No lymphadenopathy.  No thyromegaly.  Trachea is midline. HEART:  Regular rate and rhythm.  No murmurs. LUNGS:  Clear to auscultation bilaterally.  No wheezes, rales, or rhonchi. ABDOMEN:  Soft, nondistended.  Tender in the right upper quadrant epigastric region. EXTREMITIES:  Warm and well perfused without pitting edema. SKIN:  No evidence of rashes. NEUROLOGIC:  She has a tremor that is most pronounced in her left upper extremity and has a resting tremor.  UA is positive for squames and positive for LE.  Lipase 23.  Sodium 141, potassium 3.5, chloride 108, CO2 26, BUN 14, creatinine 0.82, glucose 80.  White count 7.8, hemoglobin 14, hematocrit 41.6, platelet count 227.  Urine pregnancy is negative.  Bilirubin is 0.84, AST 21, phos is 47.  Ultrasound is positive sonographic Murphy sign, positive for multiple layering stones.  No wall thickening or pericholecystic fluid.  IMPRESSION:  [name] is a 45 year old female with acute cholecystitis and lupus.  She will be placed on IV fluids and IV antibiotics and go to the OR tomorrow for laparoscopic cholecystectomy with cholangiogram.  I am going to place her on IV steroids since she has thrown up her prednisone for several days.  This patient will be  discussed with Dr. Daphine Deutscher in the morning.     Almond Lint, MD     FB/MEDQ  D:  03/19/2011  T:  03/20/2011  Job:  [id number]  Electronically Signed by Almond Lint MD on 04/07/2011 03:49:01 PM

## 2011-04-10 NOTE — Discharge Summary (Signed)
  NAME:  [name] [name] Rue       ACCOUNT NO.:  [id number]  MEDICAL RECORD NO.:  [id number]           PATIENT TYPE:  I  LOCATION:  5158                         FACILITY:  [facility]  PHYSICIAN:  [name], MD  DATE OF BIRTH:  1966/10/18  DATE OF ADMISSION:  03/19/2011 DATE OF DISCHARGE:  03/22/2011                              DISCHARGE SUMMARY   HISTORY OF PRESENT ILLNESS:  [name] is a pleasant 45 year old Hispanic female who has an underlying history of lupus who presented with abdominal pain.  She had findings consistent with cholecystitis. Decision was made to admit the patient for operative management.  SUMMARY OF HOSPITAL COURSE:  The patient was admitted on March 19, 2011 by Dr. Almond Lint after her assessment.  She was taken to the operating room the following day and underwent laparoscopic cholecystectomy with intraoperative cholangiogram by Dr. Daphine Deutscher. Cholangiogram was negative for any abnormal findings.  Postoperatively, the patient did have a little bit of pain and nausea issues that prompted an additional day of stay, but, however she finally started to tolerate regular diet and had diminished pain upon discharge.  It was felt that the patient was appropriate for discharge on postoperative day #2, March 22, 2011.  DISCHARGE DIAGNOSES: 1. Acute cholecystitis status post laparoscopic cholecystectomy with     negative cholangiogram. 2. Lupus chronic medication.  DISCHARGE MEDICATIONS:  The patient is given review of her home medications including diclofenac, VESIcare, prednisone, azathioprine, metaxalone, folic acid, and multiple multivitamin.  She is given a prescription for pain medication to use on a p.r.n. basis.  She is given preprinted discharge instructions and asked to follow up in clinic in approximately 2 weeks.     Brayton El, PA-C   ______________________________ Thornton Park Daphine Deutscher, MD    KB/MEDQ  D:  04/02/2011  T:   04/02/2011  Job:  [id number]  Electronically Signed by Brayton El  on 04/06/2011 03:59:24 PM Electronically Signed by Luretha Murphy MD on 04/10/2011 08:35:44 AM

## 2011-04-14 NOTE — H&P (Signed)
NAME:  [name] [name], [name] [name]      ACCOUNT NO.:  [id number]   MEDICAL RECORD NO.:  [id number]          PATIENT TYPE:  EMS   LOCATION:  MAJO                         FACILITY:  [facility]   PHYSICIAN:  Hollice Espy, M.D.DATE OF BIRTH:  23-Sep-1966   DATE OF ADMISSION:  05/17/2007  DATE OF DISCHARGE:                              HISTORY & PHYSICAL   PRIMARY CARE PHYSICIAN:  Gordy Savers, M.D.   CHIEF COMPLAINT:  Pain flareup.   HISTORY OF PRESENT ILLNESS:  Patient is a 45 year old Hispanic female  with past medical history of rheumatologic lupus-like syndrome, who has  occasional pain crises who has occasional flareups, the last being  approximately two months ago.  In addition, the patient has a history of  myeloneuritis multiplex, Raynaud's syndrome, and polyarthritis.  She  tells me that she follows regularly with a rheumatoid arthritis and is  on chronic pain medications and steroids.  She started having a flareup,  most noticeably in her fingers, starting four days ago.  At that time,  she touched base with her rheumatologist, who advised the patient to  increase her pain medication dose, use warm compresses.  Her symptoms  persisted and continued to worsen, to the point where she could not take  anymore and came into the emergency room.  In the emergency room, she  was given 10 units of IV morphine, which did little to improve her  symptoms.  She was also given prednisone.  She currently still complains  of severe pain in her bilateral fingers as well as her lower back and  knees.  She complains of a mild headache.  She denies any vision changes  or dysphagia.  No chest pain or palpitations.  No shortness of breath,  wheeze, or cough.  No abdominal pain.  She says her bowels move  regularly.  No constipation, diarrhea.  No hematuria or dysuria.  Her  pain is otherwise described above.  Review of systems is otherwise  negative.   Patient's past medical history includes  GERD, anxiety, depression,  history of hypothyroidism, history of migraine headache, history of  myeloneuritis, multiplex Raynaud's syndrome, lupus-like syndrome with  polyarthritis, major depression, and acute pain flareups.   MEDICATIONS:  Based on an updated list that she brought into the  hospital, Synthroid 25 p.o. daily, Cymbalta 60 p.o. b.i.d., Ativan 1 mg  p.o. b.i.d., plus an additional 1 mg at noon p.r.n., Protonix 40 p.o.  b.i.d., Voltaren 75 p.o. b.i.d., prednisone 5 p.o. daily, Topamax 100  p.o. b.i.d., perphenazine 2 mg in the morning, 4 mg at night,  multivitamins p.o. daily, a high-fiber diet, Imuran 175 mg p.o. nightly.   ALLERGIES:  Patient is allergic to PENICILLIN, BIAXIN, PHENERGAN,  COMPAZINE, IV DYE, D.H.E., and LATEX.   SOCIAL HISTORY:  No tobacco, alcohol, or drug use.   FAMILY HISTORY:  Noncontributory.   PHYSICAL EXAMINATION:  VITALS ON ADMISSION:  Temp 98, heart rate 93,  blood pressure 104/65, respirations 18, O2 sat 96% on room air.  GENERAL:  Patient is alert and oriented x3 in some distress secondary to  pain.  HEENT:  Normocephalic and atraumatic.  Her  mucous membranes are slightly  dry.  NECK:  She has no carotid bruits.  HEART:  Regular rate and rhythm.  S1 and S2.  LUNGS:  Clear to auscultation bilaterally.  ABDOMEN:  Soft, nontender, nondistended.  Positive bowel sounds.  EXTREMITIES:  No clubbing or cyanosis.  Trace pitting edema.  She has  some bilateral mild swelling and tenderness about her knee joints as  well as some tenderness at the PIP and DIP joints of most of her  fingers.   LAB WORK:  White count 4.8.  No shift.  H&H 12.1 and 38.3.  MCV 96,  platelet count 312.  Sodium 139, potassium 4.2, chloride 108, bicarb 21,  BUN 19, creatinine 0.9, glucose 100.   ASSESSMENT/PLAN:  1. Flareup of patient's rheumatologic condition, which includes      polyneuritis multiplex and Raynaud's phenomena.  Will hold her      prednisone and treat  her with IV steroids, high-dose Solu-Medrol 60      q.8h., and will also continue her pain medications but in addition      put her on IV morphine as well.  Will continue the rest of her      medications.  2. Major depression:  Continue Cymbalta.  3. Hypothyroidism:  Continue Synthroid.      Hollice Espy, M.D.  Electronically Signed     SKK/MEDQ  D:  05/18/2007  T:  05/18/2007  Job:  [id number]   cc:   Gordy Savers, MD

## 2011-04-14 NOTE — Discharge Summary (Signed)
NAME:  [name] [name]      ACCOUNT NO.:  [id number]   MEDICAL RECORD NO.:  [id number]          PATIENT TYPE:  INP   LOCATION:  6729                         FACILITY:  [facility]   PHYSICIAN:  Valerie A. Felicity Coyer, MDDATE OF BIRTH:  16-Jun-1966   DATE OF ADMISSION:  08/09/2007  DATE OF DISCHARGE:  08/12/2007                               DISCHARGE SUMMARY   DISCHARGE DIAGNOSIS:  1. Polyarthralgias with pain crisis, improved.  Continue steroid      taper as below.  2. 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      Outpatient followup with rheumatologist and primary M.D. as needed      and as previously scheduled.  3. History of depression.  4. Hypothyroidism.   DISCHARGE MEDICATIONS:  1. Imuran 150 mg p.o. q.h.s.  2. Prednisone taper 40 mg daily x3 days, then 30 mg daily x3 days,      then 20 mg daily x3 days, then 10 mg daily x3 days, then resume      maintenance dose of 5 mg daily.  3. Percocet 10/650 1 tablets p.o. q.6h. p.r.n.  4. Ativan 1 mg a.m. plus 2 mg q.h.s. plus q.6h. p.r.n. anxiety.  5. Perphenazine 2 mg tablets 1 p.o. q.a.m. plus 2 p.o. q.h.s.  6. Synthroid 0.25 mg once daily.  7. Cymbalta 60 mg b.i.d.  8. Topamax 100 mg b.i.d.  9. Voltaren 75 mg b.i.d.  10.Protonix 40 mg b.i.d.  11.Fentanyl 25 mcg patch q.72h.   HOSPITAL FOLLOWUP:  With Dr. Amador Cunas, primary care physician, and  rheumatologist, Dr. Kellie Simmering as scheduled.   CONDITION ON DISCHARGE:  Medically stable.   HOSPITAL COURSE BY PROBLEM:  Problem 1.  Pain crisis.  The patient is a  45 year old with ill-defined pain syndrome and recent negative  serological workup by outpatient rheumatologist, Dr. Kellie Simmering, consistent  with previous negative hospital workups who presents again to the  emergency room the night of admission complaining of severe and  debilitating pain in her knees, her elbows, and her wrists.   Despite  lack of clinical evidence of arthritis, inflammation, erythema, warmth,  due to assistance with need for rest, IV steroids, and IV morphine, she  was admitted for control of these pain symptoms.  She was treated with  24 hours of Solu-Medrol as well as IV morphine, and at her request and  increased dose back on her Imuran.  It had been recently reduced by her  rheumatologist, Dr. Kellie Simmering as an outpatient as has had her prednisone  decreased.  Please see dictation H&P by Dr. Amador Cunas for further  details on this.  With 24 hours of treatment, she felt symptomatically  improved and we changed her back to her oral prednisone taper as well as  continuing the higher dose of Imuran.  As we prepared for discharge, she  felt she was not yet quite ready so we provided another 2 doses of IV  steroids prior to discharge.  Again during this hospitalization, there  was no subjective or serologic evidence to help suggest the nature of  her symptoms or definitive diagnosis, and she remained hemodynamically  stable.   Outpatient followup with her rheumatologist and primary M.D. with  attempts to continue weaning these toxic and dangers long-term  medications as able, and referral out to [facility] for  further management is to be considered.      Valerie A. Felicity Coyer, MD  Electronically Signed     VAL/MEDQ  D:  08/12/2007  T:  08/12/2007  Job:  [id number]

## 2011-04-14 NOTE — Discharge Summary (Signed)
NAME:  [name] [name]      ACCOUNT NO.:  [id number]   MEDICAL RECORD NO.:  [id number]          PATIENT TYPE:  INP   LOCATION:  5003                         FACILITY:  [facility]   PHYSICIAN:  Sean A. Everardo All, MD    DATE OF BIRTH:  1966-02-19   DATE OF ADMISSION:  05/17/2007  DATE OF DISCHARGE:  05/21/2007                               DISCHARGE SUMMARY   REASON FOR ADMISSION:  Chronic exacerbation of chronic pain syndrome.   HISTORY OF PRESENT ILLNESS:  A 45 year old woman admitted by Dr.  Rito Ehrlich on May 17, 2007, with an exacerbation of her chronic pain  syndrome.  Please refer to with his dictated history and physical for  the details.   HOSPITAL COURSE:  The patient was admitted and treated with intravenous  steroids and aggressive pain medication.  Her outpatient medications  were otherwise continued.  Her.  Her steroids were tapered to the point  where she is sent home on prednisone 20 mg a day and she will follow  this up with Dr. Amador Cunas with the anticipation of further tapering.  She is discharged May 21, 2007, in good condition.  She had diabetes in  the hospital due to her steroids and she will need a recheck of her  glucose as an outpatient now that her steroids are being significantly  tapered.   DISCHARGE DIAGNOSES:  Same as admission.   MEDICATIONS:  Prednisone 20 mg a day.  Otherwise same as on the  admission history and physical.   ACTIVITY:  Increase slowly.   DIET:  __________      Cleophas Dunker Everardo All, MD  Electronically Signed     SAE/MEDQ  D:  05/21/2007  T:  05/21/2007  Job:  [id number]   cc:   Gordy Savers, MD

## 2011-04-14 NOTE — Discharge Summary (Signed)
NAME:  [name] [name], [name] [name]     ACCOUNT NO.:  [id number]   MEDICAL RECORD NO.:  [id number]           PATIENT TYPE:   LOCATION:                                 FACILITY:   PHYSICIAN:  Gordy Savers, MD    DATE OF BIRTH:   DATE OF ADMISSION:  12/28/2007  DATE OF DISCHARGE:  12/31/2007                               DISCHARGE SUMMARY   FINAL DIAGNOSIS:  Polyarthralgia with chronic pain syndrome.   DISCHARGE MEDICATIONS:  1. Zofran 8 mg daily.  2. Percocet 10/650 one every 6 hours as needed for pain.  3. Levothyroxine 25 mcg daily.  4. Protonix 40 mg daily.  5. Topamax 100 mg twice daily.  6. Proventil HFA 2 inhalations every 6 hours as needed.  7. Lorazepam 1 mg in the morning, one at noon and two at bedtime.  8. Diclofenac 75 mg twice daily.  9. Cymbalta 60 mg twice daily.  10.Prednisone 10 mg daily.  11.Azathioprine 150 mg daily.  12.Fentanyl patch 25 mcg per hour every 3 days.  13.Perphenazine 2 mg, one the morning, 2 tablets at bedtime.   HISTORY OF PRESENT ILLNESS:  The patient is a 45 year old female with a  long history of poly arthralgias and chronic pain syndrome.  She  presented to the clinic with a 36-hour history of worsening pain.  This  was primarily in the right elbow, both hips and knees.  There was no  fever, joint swelling, erythema.  She described her pain as being 10 on  a scale of 10.  She was thus admitted for pain control.   LABORATORY DATA/HOSPITAL COURSE:  The patient was admitted to hospital  for evaluation and treatment.  Clinical exam revealed no signs of active  sinusitis.  White count and temperature remained normal.  She was  maintained on her Duragesic patch.  Initially received parenteral  antibiotic therapy.  She was placed on a regular diet.  For the first 24  hours, the patient received parenteral Solu-Medrol and this was  transitioned to oral prednisone.  At the time of discharge, the patient  was modestly improved.  She was  ambulatory with the assistance of her  cane and seemed to be at baseline.   DISPOSITION:  The patient was discharged today to follow up with pain  management in 3 days.  The patient was given a limited prescription of  Percocet and fentanyl until her follow-up appoint with pain management.  She will be discharged on her usual preadmission regimen.   CONDITION ON DISCHARGE:  Stable.      Gordy Savers, MD  Electronically Signed     PFK/MEDQ  D:  12/31/2007  T:  01/01/2008  Job:  [id number]

## 2011-04-14 NOTE — H&P (Signed)
NAME:  [name] [name], [name] [name]      ACCOUNT NO.:  [id number]   MEDICAL RECORD NO.:  [id number]          PATIENT TYPE:  INP   LOCATION:  5714                         FACILITY:  [facility]   PHYSICIAN:  Barnetta Chapel, MDDATE OF BIRTH:  03-26-1966   DATE OF ADMISSION:  06/13/2007  DATE OF DISCHARGE:                              HISTORY & PHYSICAL   PRIMARY CARE PHYSICIAN:  [facility], Dr. Eleonore Chiquito.   CHIEF COMPLAINT:  Knee and hand pain.   HISTORY OF PRESENTING COMPLAINT:  The patient is a 45 year old female  with a history of chronic pain syndrome.  She also carries a diagnosis  of lupus like syndrome.  She tells me that she sees rheumatologist in  [place], [place].  She is on prednisone 10 mg p.o. once daily.  The patient was discharged from this hospital about four weeks ago with  same chronic pain syndrome.  The patient presents today with one day  history of bilateral knee pain and pain in the hand, brachial bones, and  joint pains.  No associated fever or chills.  No trauma.  No associated  constitutional symptoms.  According to the patient the pain meds at home  were not enough to control her pain.   PAST MEDICAL HISTORY:  1. Chronic pain syndrome.  2. History of lupus like syndrome.  3. A steroid-induced hyperglycemia.  4. Polyarthritis.  5. Raynaud's syndrome.  6. GERD.  7. __________  .  8. Depression.  9. Hypothyroidism.   ALLERGIES:  COMPAZINE, BIAXIN, PHENERGAN, PENICILLIN, __________  .   MEDICATIONS PRIOR TO ADMISSION:  1. Prednisone 10 mg p.o. once daily.  2. Multivitamin one tab p.o. once daily.  3. Imuran 175 mg p.o. q.h.s.  4. Synthroid 25 mg p.o. once daily.  5. Cymbalta 60 mg p.o. b.i.d.  6. Ativan 1 mg p.o. b.i.d., 1 mg at noontime p.r.n.  7. Protonix 40 mg p.o. b.i.d.  8. __________  .  9. Topamax 100 mg b.i.d.  10.Perphenazine 2 mg in the morning and 4 mg at nighttime.  11.Percocet 10/650 one tab p.o. q.6 h. p.r.n.  12.Zofran  5 mg p.r.n.  13.Albuterol inhalers p.r.n.  14.Fentanyl patch 25 mcg q.72 h.   SOCIAL HISTORY:  The patient is divorced.  The patient has one child.  She denies any use of alcohol, cigarettes or illicit substances.   FAMILY HISTORY:  Noncontributory.   REVIEW OF SYSTEMS:  No headache or neck pain.  No chest pain, no  shortness of cough, no fever or chills.  No GI symptoms or urinary  symptoms.   PHYSICAL EXAMINATION:  VITAL SIGNS:  On admission temp 98.3, blood  pressure 119/81, heart rate 101, respiratory rate 18, O2 sat 95%,  GENERAL:  The patient is not in any acute distress.  HEENT:  PERRLA, no jaundice.  Extraocular muscle movements are in  intact.  NECK:  Supple.  No recent JVD or lymphadenopathy.  LUNGS:  Clear to auscultation.  CV:  S1, S2, no heart murmur appreciated.  ABDOMEN:  Soft and benign.  NEUROLOGICAL:  Nonfocal.  MUSCULOSKELETAL:  Joints:  No warmth or tenderness to palpation.  No  swelling.   IMPRESSION:  1. Chronic pain syndrome.  2. Lupus like syndrome.   PLAN:  Will admit patient to regular medical floor.  Will continue  patient's home meds.  Will control the patient's pain.  Further  management will depend on hospital course.  Will also check __________  CRP __________  .  Will also check TSH.      Barnetta Chapel, MD  Electronically Signed     SIO/MEDQ  D:  06/13/2007  T:  06/13/2007  Job:  [id number]   cc:   Gordy Savers, MD

## 2011-04-14 NOTE — H&P (Signed)
NAME:  [name] [name], [name] [name]       ACCOUNT NO.:  [id number]   MEDICAL RECORD NO.:  [id number]          PATIENT TYPE:  INP   LOCATION:  5526                         FACILITY:  [facility]   PHYSICIAN:  Gordy Savers, MDDATE OF BIRTH:  1966-02-19   DATE OF ADMISSION:  01/17/2009  DATE OF DISCHARGE:                              HISTORY & PHYSICAL   CHIEF COMPLAINT:  Pain.   HISTORY OF PRESENT ILLNESS:  The patient is a 45 year old Hispanic  female with a history of polyarthritis and poorly defined autoimmune  disorder.  She was followed by a rheumatologist in [place], [place] who prescribes her analgesics and treats her rheumatologic  disorder.  She states that she has been out of her analgesics for  several days and yesterday began having extreme pain in both knees to  right hip and the small joints of both hands.  She states that she is in  an acute pain crisis, with her pain rated 10/10.  She has been evaluated  and treated in the emergency department for a number of hours and pain  has been refractory to parenteral steroids, as well as parenteral  narcotics.  She states that she is little improved as far as her pain.  She states that she is ambulatory, but quite uncomfortable when she does  so.  She states that she is not able to be discharged due to her severe  pain.   She obtains all her narcotic pain medications from her out-of-state  rheumatologist and she has a chronic pain contract where she has agreed  to obtain all her narcotics from her out-of-state rheumatologist and not  to obtain additional pain medications locally.  ED evaluation was fairly  nonrevealing.  She was afebrile with a normal pulse rate, respiratory  rate, O2 saturation 99%.  Her clinical exam was noncontributory with no  signs of active arthritis.  The patient is now admitted for further  evaluation and treatment of her acute pain crisis.   PAST MEDICAL HISTORY:  Medical problems include a  history of optic  neuritis, polyarthritis, and a secondary chronic pain syndrome.  She has  treated hypothyroidism, gastroesophageal reflux disease, and a history  of depression and anxiety.  She has remote history of pyelonephritis.  She has had a number of previous admissions for treatment of her acute  pain crisis.   LABORATORY DATA:  Laboratory evaluation in the past has yielded normal  inflammatory markers.   PRESENT MEDICAL REGIMEN:  1. Cymbalta 60 mg daily.  2. Levothyroxine 25 mcg daily.  3. Lorazepam 2 mg daily.  4. Multivitamins.  5. Ultram 50 mg every 6 hours p.r.n. pain.  6. Prednisone 10 mg daily.  7. Protonix 40 mg daily.  8. Topamax 100 mg in the morning, 150 mg in the evening.  9. Voltaren 75 mg twice daily as needed.  10.Percocet 10/650 one every 6 hours p.r.n. pain.  11.Proventil 2 inhalations every 6 hours p.r.n.  12.Fentanyl patch 25 mcg every 72 hours.  13.Methotrexate, she self injects weekly, 0.9 mL.   ALLERGIES:  PENICILLIN, BIAXIN, PHENERGAN, DHEA, DILAUDID, and  PROMETHAZINE.   PAST MEDICAL HISTORY:  Pertinent for a history of Raynaud's phenomenon.   PAST SURGICAL HISTORY:  Appendectomy and tonsillectomy.   FAMILY HISTORY:  Father's health status unknown, mother age 62, in good  health.  One sister history of asthma.   SOCIAL HISTORY:  The patient is single.  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  She has a daughter with  asthma and ADHD.   REVIEW OF SYSTEMS:  Exam is otherwise unremarkable.  She states her  asthma has been stable.  She has done quite well except for the past 2  days with exacerbation of her chronic pain syndrome.  She was last seen  in our office on December 07, 2008, for followup of her arthralgias.  At  that time, her rheumatologic status was fairly stable.  The only  complaint was pain involving the second and third MCP joints of both  feet.   LABORATORY STUDIES:  At that time were  nonrevealing.  X-ray of her foot  revealed no fracture or subluxation.   PHYSICAL EXAMINATION:  GENERAL:  Well-developed, well-nourished Hispanic  female who appeared to be in no distress.  SKIN:  Warm and dry without rash.  HEENT:  Pupil responses were normal.  Conjunctivae clear.  ENT  unremarkable.  Oropharynx was benign.  NECK:  No thyroid enlargement, adenopathy, or bruits.  CHEST:  Clear.  CARDIOVASCULAR:  Normal S1, S2, no murmurs.  ABDOMEN:  Benign.  No organomegaly.  EXTREMITIES:  No edema.  MUSCULOSKELETAL:  No signs of active synovitis.  She complained of pain  involving the PIP and DIP joints of both hands, especially fingers 2  through 5.  She was able to make a fist, but she stated this was quite  painful.  Likewise, she complained of severe pain involving both knees.  There is no signs of active inflammation.  There is some subjective pain  with gentle flexion of the right hip.   IMPRESSION:  Exacerbation of chronic pain syndrome.   ADDITIONAL DIAGNOSES:  Undefined rheumatologic disorder with history of  Raynaud's vasculitis, and polyarthralgias.  History of asthma, anxiety  disorder, gastroesophageal reflux disease.   DISPOSITION:  The patient will be admitted to the hospital.  She will be  maintained on her multiple preadmission medications.  Inflammatory  markers will be checked in the morning.  She will be treated  symptomatically for pain control.      Gordy Savers, MD  Electronically Signed     PFK/MEDQ  D:  01/17/2009  T:  01/18/2009  Job:  [id number]

## 2011-04-14 NOTE — Assessment & Plan Note (Signed)
[facility] OFFICE NOTE   NAME:[name] [name], [name] [name]               MRN:          [id number]  DATE:05/27/2007                            DOB:          06-08-1966    A 45 year old female who is seen today for a followup of a recent  hospital admission for exacerbation of her chronic pain syndrome.  She  states the she has recently decreased her prednisone to 20 mg daily.  She feels reasonably well, still having some back, knee, and elbow  discomfort.   EXAM:  Today revealed her to be in no acute distress.  No signs of  active synovitis.   IMPRESSION:  1. History of lupus-like syndrome.  2. Chronic pain syndrome.   DISPOSITION:  Her prednisone dose to be decreased to __________ mg  daily.  After one week will be decreased to __________ mg daily..   Laboratory studies including erythrocyte sedimentation rate will be  checked on July 8.  Return here in 6 weeks for followup.     Gordy Savers, MD  Electronically Signed    PFK/MedQ  DD: 05/27/2007  DT: 05/27/2007  Job #: [id number]

## 2011-04-14 NOTE — Discharge Summary (Signed)
NAME:  [name] [name]       ACCOUNT NO.:  [id number]   MEDICAL RECORD NO.:  [id number]          PATIENT TYPE:  INP   LOCATION:  5526                         FACILITY:  [facility]   PHYSICIAN:  Willow Ora, MD           DATE OF BIRTH:  02/16/1966   DATE OF ADMISSION:  01/17/2009  DATE OF DISCHARGE:  01/20/2009                               DISCHARGE SUMMARY   BRIEF HISTORY AND PHYSICAL:  Ms. [name] Bumpers is a 45 year old  Hispanic female with a history of polyarthritis and poorly-defined  autoimmune disease who is usually seen by a rheumatologist in [place],  [place].  This doctor has been treating her with pain killers and  follow up on her rheumatology disorder.  Unfortunately, prior to  admission, she developed extreme pain in the knees and hips as well as  the small joints and she states that this is an acute pain crisis for  her.  She ran out of her pain medication on December 30, 2008 and she  seemed that this crisis was exacerbated by this fact.  At the emergency  room, she was evaluated and then admitted to the medical floor for  subsequent care.   LABORATORY DATA AND X-RAYS:  CBC showed a white count of 8.7 with a  hemoglobin of 13.7, and platelets of 226.  Creatinine 0.8, potassium  3.8, blood sugar 91, and calcium 8.9.  C-reactive protein is 0.4.  TSH  is 0.7.  Sed rate was 2.0.   HOSPITAL COURSE:  The patient was admitted to the hospital and she was  restarted on all her outpatient medication.  Her hospital stay was  unremarkable and after a careful review of her outpatient medication we  decided to discharge her home with the same medication.  The only change  being that we will increase her fentanyl patch from 25-50 mcg.  She  states that she has an appointment with her rheumatologist on February 06, 2009.  Consequently, she was discharged with a 3-week supply of the  narcotics.   DISCHARGE INSTRUCTIONS:  1. See the rheumatologist as planned on February 06, 2009.  2. Call Dr. Eleonore Chiquito and see him within 2 weeks.  3. Cymbalta 60 mg 2 tablets every day, a prescription for 42 tablets      was provided.  4. Levothyroxine 25 mcg one p.o. daily, a prescription for 30 tablets      and no refills was provided.  5. Lorazepam 2 mg take one in the morning, one in the afternoon, and      two at bedtime, a prescription for 84 tablets was given.  No      refills.  6. Prednisone 10 mg one p.o. daily, a prescription for 30 tablets and      no refill provided.  7. Topamax 50 two tablets in the morning and three in the afternoon, a      prescription for 105 tablets provided.  8. Voltaren 75 mg one p.o. daily, a prescription for 30 tablets.  9. Percocet 10/650 two p.o. q.i.d. p.r.n., a prescription for 50 and  no refills.  10.Proventil as before, a prescription for 1 unit provided.  11.Fentanyl patch 50 mcg 1 every 3 days, a prescription for 7 patches      provided.  12.She is to continue with her self-injection of methotrexate 0.19 mL      weekly as before.   ADMITTING DIAGNOSIS:  Intractable pain.   DISCHARGE DIAGNOSES:  1. Ill-defined autoimmune disease with chronic pain.  2. Gastroesophageal reflux disease.  3. Anxiety.      Willow Ora, MD  Electronically Signed     JP/MEDQ  D:  01/20/2009  T:  01/20/2009  Job:  [id number]   cc:   Gordy Savers, MD

## 2011-04-14 NOTE — Discharge Summary (Signed)
NAME:  ARNELL, MAUSOLF      ACCOUNT NO.:  [id number]   MEDICAL RECORD NO.:  [id number]          PATIENT TYPE:  INP   LOCATION:  5714                         FACILITY:  [facility]   PHYSICIAN:  Filomena Jungling, N.P.    DATE OF BIRTH:  [birth year]   DATE OF ADMISSION:  06/12/2007  DATE OF DISCHARGE:  06/15/2007                               DISCHARGE SUMMARY   DISCHARGE DIAGNOSES:  1. Chronic pain syndrome.  2. Lupus leg syndrome seen by out of state rheumatologist.  We will      need to establish local care.  3. History of polyarthritis.  4. Depression/anxiety.  5. History of hypothyroid.  6. History of gastroesophageal reflux disease.  7. History of migraines.  8. History of cervical dysplasia.   HISTORY OF PRESENT ILLNESS:  [name] [name] is a 45 year old  female who was admitted on June 13, 2007 with chief complaint of knee  and hand pain.  She has a known history of chronic pain syndrome and has  had several recent admissions for the same complaint.  She was admitted  for further evaluation and treatment.   COURSE OF HOSPITALIZATION:  1. Chronic pain syndrome:  The patient was admitted.  CRP was      performed which was within normal limits as well as an ESR which      was normal with a value of 2.  No clear signs of acute      rheumatologic flair were noted clinically.  The patient reported      continued pain with Percocet and oral pain medications.  She was      continued on her Duragesic patch and required frequent p.r.n. IV      morphine.  The question of drug seeking behavior was considered      during this hospitalization.  It appears that the patient is      getting the Percocet filled from a Dr. Micheline Maze.  Last filled      according to her pharmacy on May 30 for a total of 75 tablets, in      addition to her fentanyl patch.  Could consider referral to a pain      clinic.  We will defer to the patient's primary care.   MEDICATIONS AT TIME OF DISCHARGE:  1.  Prednisone 40 mg p.o. on July 17, 20 mg p.o. on July 18 and July      19, then 10 mg p.o. daily thereafter.  2. Imuran 175 mg p.o. daily.  3. Synthroid 25 mcg p.o. daily.  4. Cymbalta 60 mg p.o. b.i.d.  5. Ativan 1 mg p.o. at 8 a.m. and 12 p.m., 2 mg p.o. nightly.  6. Protonix 40 mg p.o. daily.  7. Voltaren 75 mg p.o. b.i.d.  8. Topamax 100 mg p.o. b.i.d.  9. Trilafon 2 mg p.o. daily in the morning and 4 mg p.o. daily in the      evening.  10.Multivitamin 1 tab p.o. daily.  11.Fentanyl patch 25 mcg q.72 h.  12.Zofran 8 mg p.o. q.6 h. p.r.n.  13.Proventil 2 puffs q.4 h. p.r.n.  14.Percocet 10/650 one tab p.o. q.6 h.  p.r.n.   FOLLOW UP:  The patient was instructed to follow up with Dr. Eleonore Chiquito in 1-2 weeks and contact the office for an appointment.      Sandford Craze, NP    ______________________________  Filomena Jungling, N.P.    MO/MEDQ  D:  06/15/2007  T:  06/16/2007  Job:  [id number]   cc:   Gordy Savers, MD

## 2011-04-14 NOTE — H&P (Signed)
NAME:  [name] [name]       ACCOUNT NO.:  [id number]   MEDICAL RECORD NO.:  [id number]          PATIENT TYPE:  INP   LOCATION:  1606                         FACILITY:  [name]   PHYSICIAN:  Alvy Beal, MD    DATE OF BIRTH:  02/13/1966   DATE OF ADMISSION:  07/01/2009  DATE OF DISCHARGE:                              HISTORY & PHYSICAL   HISTORY OF PRESENT ILLNESS:  She is a very pleasant 45 year old with  chronic debilitating pain.  She has been under the care of my partner,  Dr. Darrelyn Hillock, for some time for bilateral hip pain.  The patient has had  a workup so far which has been negative for occult fracture which has  included an MRI and a bone scan.  At this point in time the patient was  brought to the emergency room by her family indicated she is in too much  pain and can no longer care for her.  As such, I spoke with my partner  and we had the patient admitted for pain control and further workup for  other abnormalities.  The patient states that there has been no  significant recent injury or event to account for increased pain.  She  has been seen in the past in the ER several times for chronic pain.   For past medical, surgical, family, and social history, it is outlined  in my partner, Dr. Jeannetta Ellis clinic notes.  He  reviewed them with me.  Please refer to them for specifics.   PHYSICAL EXAMINATION:  GENERAL:  The patient is alert and oriented x3.  She is in obvious distress, complaining of horrific pain, in bed.  NEUROLOGIC:  She is cranial nerves II-XII were tested and intact.  CHEST:  No shortness of breath, chest pain.  ABDOMEN:  The abdomen soft and nontender.  MUSCULOSKELETAL:  She has horrific pain with any attempts at range of  motion or palpation of either greater troch.  She is unable to  weightbear because of severe pain.  There is no significant swelling,  laceration, abrasion, about the hip region.   At this point in time the patient will be admitted,  placed on a PCA for  pain control.  As part of her work-up, we will get a CT of the abdomen  and pelvis with oral and IV contrast, also get an SPAP, urine __________  protein, CBC and complete  SMA12.  Dr. Darrelyn Hillock will be evaluating the  patient in the morning morning and he will take over her care.      Alvy Beal, MD  Electronically Signed     DDB/MEDQ  D:  07/01/2009  T:  07/02/2009  Job:  [id number]

## 2011-04-14 NOTE — Discharge Summary (Signed)
NAME:  [name] [name]       ACCOUNT NO.:  [id number]   MEDICAL RECORD NO.:  [id number]          PATIENT TYPE:  INP   LOCATION:  5526                         FACILITY:  [facility]   PHYSICIAN:  Willow Ora, MD           DATE OF BIRTH:  1966-04-08   DATE OF ADMISSION:  01/17/2009  DATE OF DISCHARGE:                               DISCHARGE SUMMARY   ADDENDUM   We will discharge her on prednisone taper as follows:  Prednisone 10 mg  5 tablets p.o. daily for 1 day, 4 tablets p.o. daily for 1 day, 3  tablets p.o. daily for 1 day, 2 tablets p.o. daily for 1 day, and 1 p.o.  daily.  Thereafter, a prescription for 45 tablets, no refills provided.      Willow Ora, MD  Electronically Signed     JP/MEDQ  D:  01/20/2009  T:  01/20/2009  Job:  [id number]

## 2011-04-14 NOTE — Discharge Summary (Signed)
NAME:  [name] [name]      ACCOUNT NO.:  [id number]   MEDICAL RECORD NO.:  [id number]          PATIENT TYPE:  INP   LOCATION:  5003                         FACILITY:  [facility]   PHYSICIAN:  Sean A. Everardo All, MD    DATE OF BIRTH:  Dec 04, 1965   DATE OF ADMISSION:  05/17/2007  DATE OF DISCHARGE:                               DISCHARGE SUMMARY   ADDENDUM:  The patient refused discharge on May 21, 2007 due to persistence of her  pain.  She felt that the oral pain medication was insufficient.  She was  tried on OxyIR in the hospital and by May 22, 2007 she was satisfied  with her pain control.   The addendum to medications is that she will take OxyIR 5 mg 1, 2, or 3  tablets every 4 hours as needed for pain and she is given a one time  prescription for #100 of 5 mg tablets.  She will follow up with Dr.  Amador Cunas.      Sean A. Everardo All, MD  Electronically Signed     SAE/MEDQ  D:  05/22/2007  T:  05/22/2007  Job:  [id number]

## 2011-04-14 NOTE — Op Note (Signed)
NAME:  [name] [name]       ACCOUNT NO.:  [id number]   MEDICAL RECORD NO.:  [id number]          PATIENT TYPE:  INP   LOCATION:  1606                         FACILITY:  [facility]   PHYSICIAN:  Georges Lynch. Gioffre, M.D.DATE OF BIRTH:  Mar 03, 1966   DATE OF PROCEDURE:  DATE OF DISCHARGE:                               OPERATIVE REPORT   PREOPERATIVE DIAGNOSIS:  Basocervical fracture, right hip, a stress-type  fracture.   POSTOPERATIVE DIAGNOSIS:  Basocervical fracture, right hip, a stress-  type fracture.   OPERATION:  Open reduction, internal fixation of a basocervical fracture  of the right hip utilizing the DePuy T, K II system.  I utilized a 75-mm  compression screw with a 4-hole side plate at 161 degrees angle with 4  screws for fixation of the plate, right femur.  Preop, she had 1 gram of  IV vancomycin.   SURGEON:  Dr. Darrelyn Hillock.   ASSISTANT:  Dr. Marlowe Kays MD.   PROCEDURE:  Under general anesthesia with the patient on the fracture  table, her fracture was reduced and placed in traction.  C-arm was  brought in to show excellent position of the fracture site.  Following  that, I went ahead and brought the C-arm, in after initial prep and then  a sterile prep was done.  The drapings were applied.  I located the  greater trochanter, and incision was made there and extended distally.  Note, she had quite a bit of adipose tissue present.  Incision was made  along the lateral border of the right hip.  Bleeders identified and  cauterized.  The incision was taken down to the lateral femoral shaft.  A drill hole was made in the lateral femoral shaft just distal to the  greater trochanter.  A guide pin was inserted at a 135 degrees angle.  At this time, the guide pin was inserted up into the femoral head, and  the appropriate measurements were taken.  I selected a 75 mm length  compression screw with a 4-hole side plate.  The appropriate drill hole  was made with the cortical  cancellus drill.  Following that, the nail  plate device was inserted in 1 piece.  At this particular time, we had  good position of the fracture and good position of the plate as well.  I  then affixed the plate to the femoral shaft with 4 screws.  We had an  anatomical position and a small compression screw that was inserted into  the nail plate device for compression.  X-rays taken showed excellent  position of the fracture site, excellent position of the nail device.  Thoroughly irrigated out the area  and then inserted 10 mL of FloSeal followed by some thrombin-soaked  Gelfoam and closed the wound in layers in the usual fashion.  Skin was  closed with metal staples.  Sterile Neosporin dressing was applied.  The  patient left the operating room in satisfactory condition.  The  estimated blood loss was 200 mL.           ______________________________  Georges Lynch. Darrelyn Hillock, M.D.     RAG/MEDQ  D:  07/03/2009  T:  07/03/2009  Job:  [id number]   cc:   Gordy Savers, MD  [address] [place]  [place] [address]

## 2011-04-14 NOTE — Discharge Summary (Signed)
NAME:  [name] [name]       ACCOUNT NO.:  [id number]   MEDICAL RECORD NO.:  [id number]          PATIENT TYPE:  INP   LOCATION:  1606                         FACILITY:  [facility]   PHYSICIAN:  Georges Lynch. Gioffre, M.D.DATE OF BIRTH:  July 31, 1966   DATE OF ADMISSION:  07/01/2009  DATE OF DISCHARGE:                               DISCHARGE SUMMARY   She was admitted to the hospital by my associate, Dr. Shon Baton, and at  that time she had a CAT scan that showed a stress fracture of her right  hip.  Note, this was a CAT scan of the pelvis, as well as a CAT scan of  the abdomen.  A little about this lady's history; is a very nice lady  who was followed by me in the office and she complained of hip pain.  Her plain x-rays were normal, this was about 2 weeks prior to admission.  She went on and as I explained to her, I said there had to be a problem  here because of the discomfort she was having.  She had an MRI of her  hip that I did as an outpatient that showed bursitis and no fracture, so  I told her I was still concerned about this.  I ordered a total body  bone scan and a CT of her abdomen and CT pelvis.  She went to [facility]  on a Friday 2 days before admission here and the total body bone scan  was done and I never received a result until the following Monday, and  her CAT scan apparently was not done.  Dr. Shon Baton called me on a Sunday  and said she was in a lot of pain.  I said just admit her and we will  get a CAT scan of her abdomen and pelvis because there obviously was a  problem here.  The CAT scan of her pelvis surprisingly showed a  basicervical fracture, nondisplaced stress type fracture of the right  hip.  She has been on prednisone for several years.  She has had no  recent falls.  The only fall she had was about 2 months before that.  But anyway, she was admitted, seen by me the following day and scheduled  for surgery on July 03, 2009, at which time I did open reduction,  internal fixation of a basicervical fracture of the right hip utilizing  a TK2 hip compression nail plate device.  She did well postop.  She  remained stable.  She felt much better.  On July 04, 2009, her  hemoglobin 11.1.  The Foley was in, I left it in for another day and the  plan was to get her up in a chair, just touchdown weightbearing.  She  received a call from the pharmacy about her methotrexate.  The patient  stated she needed to be on that  At that time, she was not initially  started on her prednisone for some reason, but I did review her chart  and we did restart that.  She had Solu-Cortef preop in the holding area.  Postop, she did not require any blood transfusion.  On  July 06, 2009,  she was doing fine.  She was up bed-to-chair, as well as past  weightbearing.  On July 07, 2009, she was doing well and I told her  she was ready for discharge on Monday, July 08, 2009.  Her INR on  July 07, 2009, was 1.8, hemoglobin 8.9.  I repeated that hemoglobin  and hemoglobin came back at 11.6, INR 1.7 on July 08, 2009.  The right  femoral neck, we did a plain film that showed a right femoral neck  fracture.  She had a CT of her abdomen that showed no acute abdominal  findings.  CT of her pelvis showed a basicervical fracture of her right  femoral neck.  We elected at that time to set her up for a bone density  by her private doctor once she settled from this fracture.  The  pertinent labs are all in the chart.  Her initial hemoglobin was 12.2,  hematocrit 38.6, sodium 138, potassium 3.8, chloride 105, glucose 98,  BUN 16, creatinine 0.68.  The C-reactive protein was elevated at 4.8.  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  Her initial INR was 0.9.  Protime 12.1, PTT was 30.  Remaining studies; she was setup for a  multiple myeloma evaluation.  We are waiting on those results.  There  were free kappa light chains in the urine, protein electrophoresis is  slightly elevated  to 0.8.  The free kappa lambda ratio was 18.67,  slightly high.  The remaining studies will have to be repeated.  She was  put on Lovenox the day she came in to protect her until the following  day until we started on a Coumadin/heparin protocol which she was on.   CONDITION:  Improved.   DISCHARGE DIET:  Regular diet.   DISCHARGE MEDICATIONS:  1. Protonix 40 mg b.i.d.  2. Abilify 10 mg a day.  3. Topamax 100 mg a.m., 150 mg p.m.  4. Levothyroxine 25 mcg a day.  5. Cymbalta 60 mg b.i.d.  6. Multivitamins.  7. Vitamin C 2 tablets a day.  8. Metanx 1 tablet a day.  9. Valtrex 500 mg a day.  10.Melatonin 3 mg, she takes 3 tablets daily.  11.Methotrexate, please consult her about her daily dose she takes of      that, subcu.  She has that on a Friday.  12.Prednisone 10 mg a day.  13.Lorazepam 1 mg at 8:00 a.m. and 12:00 p.m.  14.Lorazepam 2 mg at bedtime.  15.Fentanyl 50 mcg every 72 hours.  16.Ultram, we will discontinue that.  17.Percocet 10/650 one every 4 hours p.r.n. for pain.  18.Robaxin 500 mg 1 t.i.d. p.r.n. for spasms.  19.Albuterol 2 puffs every 4 hours as needed.   DISCHARGE INSTRUCTIONS:  1. I will see her in the office in 2 weeks from the day of surgery.  2. She can touchdown weightbearing with a walker with a platform      crutch on the right.  3. She should have INRs done weekly.  4. Please do not forget her daily prednisone.  5. Her sutures should be removed 2 weeks from the day of surgery.  6. If there is any problem, call Dr. Darrelyn Hillock at the office during the      day at [phone].   DISCHARGE DIET:  Regular diet.   DISCHARGE CONDITION:  Improved.   DISCHARGE DIAGNOSIS:  Basicervical fracture right hip, stress fracture.   PLAN:  She is going to need  to have a bone density examine after  everything has settled here.           ______________________________  Georges Lynch. Darrelyn Hillock, M.D.     RAG/MEDQ  D:  07/08/2009  T:  07/08/2009  Job:  [id number]

## 2011-04-14 NOTE — H&P (Signed)
NAME:  [name] [name], [name] [name]      ACCOUNT NO.:  [id number]   MEDICAL RECORD NO.:  [id number]          PATIENT TYPE:  INP   LOCATION:  6729                         FACILITY:  [facility]   PHYSICIAN:  Gordy Savers, MDDATE OF BIRTH:  1966-06-13   DATE OF ADMISSION:  08/09/2007  DATE OF DISCHARGE:                              HISTORY & PHYSICAL   CHIEF COMPLAINT:  Painful joints.   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   The patient has a history of polyarthritis diagnosed in February 2006.  At that time she was a resident of [place], [place], and  underwent extensive rheumatologic and neurological evaluation.  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  Since relocating to [facility] in September  2007 she has had two admissions at [facility] for acute pain crises and  has been admitted here in June and July 2008 for treatment of  exacerbations of polyarthritis.  Approximately 3 weeks ago she saw Dr.  Kellie Simmering for rheumatologic evaluation.  At that time her Imuran and  prednisone were slightly tapered.  For the past 4 days she has had  increasing pain involving the elbows, small joints of the hands, as well  as both knees.  She describes pain maximally in the PIP and DIP joints  of both hands.  She is on a fentanyl patch and pain has been  unresponsive to Percocet every 4-6 hours.  When she presented to the  emergency department she states her pain was 10 on a scale of 10, but  since parenteral morphine has subsided to a level 7.  She is now  admitted for further pain control.   PAST MEDICAL HISTORY:  In addition to above, the  patient has a history  of cervical dysplasia, hypothyroidism, gastroesophageal reflux disease,  and a history of migraine headaches.  She has had a remote appendectomy  and tonsillectomy.  She has a history of depression and has been  followed by Dr. Denzil Magnuson locally.  Until recently the patient has  maintained a professional relationship with her rheumatologist in  [place], [place], who has been prescribing all her analgesics.   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  Since that time she  has been disabled due to her chronic pain.  She has a mother living in  the area, as well as a young daughter.  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   EXAMINATION:  Revealed a well-developed, healthy-appearing, thin  Hispanic female in no apparent distress.  VITAL SIGNS:  Stable.  HEAD AND NECK:  Revealed no signs of trauma, no meningismus.  Pupil  responses were normal.  Oropharynx clear.  There is no cervical  adenopathy.  CHEST:  Clear.  CARDIOVASCULAR EXAM:  Revealed a normal S1 and S2, no murmurs or  gallops.  ABDOMEN:  Soft and nontender.  No organomegaly.  EXTREMITIES:  Revealed no edema.  Peripheral pulses were full.  MUSCULOSKELETAL EXAM:  Revealed her to have some subjective tenderness  involving the small joints of the hands and elbows as well as both  knees.  There is no swelling, erythema, or excessive warmth.   IMPRESSION:  1. Acute pain crisis.  2. Chronic pain syndrome.  3. Undefined rheumatologic disorder with history of polyarthritis      vasculitis.   DISPOSITION:  Will admit the patient to the hospital for pain control.  Will check a sed rate, C-reactive protein in the morning.  There has  been a recent history of some slightly elevated liver function studies  and this will also repeated in the morning.  The patient has also been  treated for a  recent UTI and a followup UA will also be reviewed.      Gordy Savers, MD  Electronically Signed     PFK/MEDQ  D:  08/09/2007  T:  08/10/2007  Job:  [id number]

## 2011-04-17 NOTE — Assessment & Plan Note (Signed)
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   In February of 2006, the patient developed polyarthritis as well as  optic neuritis. There was some initial concern about MS and the patient  has had extensive rheumatologic and neurologic evaluation. At the  present time, she is felt to have a lupus-like syndrome, but has had  apparently no positive serology. In addition to her polyarthritis, she  has a history of a left leg mono neuritis multiplex with reflex  sympathetic dystrophy as well as Raynaud's phenomena. In September of  last year as well as last month, she was hospitalized at [facility] for  an acute pain crisis. This apparently presents with severe pain,  enlarged joints such as the hips and knees and also the back. She does  not describe any active synovitis with the acute pain. She is on  immunosuppressant therapy including azathioprine 150 mg daily and  prednisone 5 mg daily.   Additional diagnoses include:  1. A history of cervical dysplasia and she is to followup with a local      gynecologist.  2. Additionally, she has hypothyroidism.  3. Gastroesophageal reflux disease.  4. History of migraine headaches, which have been quite stable.  5. She has had a remote appendectomy.  6. Tonsillectomy, both as a teenager.   Her multiple medical regimen and drug allergies were reviewed.   SOCIAL HISTORY:  She is a divorced parent with one daughter. She is a  gravida 2, para 1, aborta 0. Daughter has a history of asthma and ADHD.  She has relocated from [place], [place] and is in an active  Lobbyist in  [place]. 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   FAMILY HISTORY:  Is fairly non-contributory. Details of her father's  health are unknown. Mother is age 67 and in good health. Grandparents  have a history of hypertension and diabetes. One sister with asthma. One  uncle died of complications of diabetes and cerebral and cardiovascular  disease.   PHYSICAL EXAMINATION:  Revealed a well-developed, well-nourished  appearing female with an appropriate affect. Blood pressure was low-  normal.  SKIN: Revealed no changes.  HEAD AND NECK: Revealed normal fundi.  EAR, NOSE AND THROAT: Negative. There is no thyroid enlargement.  CHEST: Was clear.  CARDIOVASCULAR: Normal heart sounds. No murmurs.  BREASTS: Not examined.  ABDOMEN: soft and nontender. No organomegaly.  EXTREMITIES: Negative. There is no signs of active synovitis.  NEURO: Revealed considerable left leg weakness. She had diminished  sensation distal to the left knee. Reflexes were brisk bilaterally. The  patient did walk with a cane and was unable to walk on her toes or heels  on the left.   IMPRESSION:  1. Lupus-like syndrome with polyarthritis.  2. History of Raynaud's.  3. Mono neuritis multiplex.  4. History of migraine headaches.  5. Hypothyroidism.  6. Gastroesophageal reflux disease.   DISPOSITION:  Her multiple medications were renewed including Sentinel  and oxycodone. She has the name and contact number with a [facility]  rheumatologist that she has been encouraged to followup with. I have  also asked for medical records detailing her complex medical history.  Will recheck in three months with CBC, liver function studies checked at  that time.     Gordy Savers, MD  Electronically Signed    PFK/MedQ  DD: 01/04/2007  DT: 01/04/2007  Job #: [id number]

## 2011-05-04 ENCOUNTER — Encounter (INDEPENDENT_AMBULATORY_CARE_PROVIDER_SITE_OTHER): Payer: Self-pay | Admitting: General Surgery

## 2011-05-05 ENCOUNTER — Encounter (INDEPENDENT_AMBULATORY_CARE_PROVIDER_SITE_OTHER): Payer: Self-pay | Admitting: Surgery

## 2011-06-30 ENCOUNTER — Encounter: Payer: Self-pay | Admitting: Internal Medicine

## 2011-06-30 ENCOUNTER — Ambulatory Visit (INDEPENDENT_AMBULATORY_CARE_PROVIDER_SITE_OTHER): Payer: Medicare Other | Admitting: Internal Medicine

## 2011-06-30 VITALS — BP 100/80 | Temp 98.3°F | Wt 185.0 lb

## 2011-06-30 DIAGNOSIS — F329 Major depressive disorder, single episode, unspecified: Secondary | ICD-10-CM

## 2011-06-30 DIAGNOSIS — IMO0002 Reserved for concepts with insufficient information to code with codable children: Secondary | ICD-10-CM

## 2011-06-30 DIAGNOSIS — R109 Unspecified abdominal pain: Secondary | ICD-10-CM

## 2011-06-30 DIAGNOSIS — F411 Generalized anxiety disorder: Secondary | ICD-10-CM

## 2011-06-30 DIAGNOSIS — I776 Arteritis, unspecified: Secondary | ICD-10-CM

## 2011-06-30 DIAGNOSIS — G8929 Other chronic pain: Secondary | ICD-10-CM

## 2011-06-30 LAB — HEPATIC FUNCTION PANEL
ALT: 18 U/L (ref 0–35)
AST: 20 U/L (ref 0–37)
Bilirubin, Direct: 0 mg/dL (ref 0.0–0.3)
Total Bilirubin: 0.3 mg/dL (ref 0.3–1.2)
Total Protein: 6.3 g/dL (ref 6.0–8.3)

## 2011-06-30 LAB — CBC WITH DIFFERENTIAL/PLATELET
Basophils Relative: 0.2 % (ref 0.0–3.0)
Eosinophils Absolute: 0.1 10*3/uL (ref 0.0–0.7)
Eosinophils Relative: 1 % (ref 0.0–5.0)
Hemoglobin: 12.3 g/dL (ref 12.0–15.0)
Lymphocytes Relative: 11.6 % — ABNORMAL LOW (ref 12.0–46.0)
MCHC: 33 g/dL (ref 30.0–36.0)
MCV: 95.6 fl (ref 78.0–100.0)
Neutro Abs: 6.1 10*3/uL (ref 1.4–7.7)
Neutrophils Relative %: 84.7 % — ABNORMAL HIGH (ref 43.0–77.0)
RBC: 3.9 Mil/uL (ref 3.87–5.11)
WBC: 7.2 10*3/uL (ref 4.5–10.5)

## 2011-06-30 MED ORDER — PANTOPRAZOLE SODIUM 40 MG PO TBEC: 40.0000 mg | DELAYED_RELEASE_TABLET | Freq: Two times a day (BID) | ORAL | Status: DC

## 2011-06-30 NOTE — Progress Notes (Signed)
  Subjective:    [name], female    DOB: 25-Dec-1965, 45 y.o.   MRN: [id number]  HPI 45 year old patient who is seen today for followup. She is followed at both [facility] and in [place] for her rheumatologic disorder. As has been fairly stable and her Imuran has been down titrated to 75 mg daily. Her chief complaint is worsening reflux symptoms since her Protonix has been decreased to a daily regimen. There has been weight gain with prednisone therapy. This also is in the process of being tapered. Complaints include right ankle hip and knee pain of several weeks' duration. In general since she seems to do fairly well. She states that she has had a nervous breakdown since her last visit here and Zyprexa has been added to her regimen. She has chronic pain and is on a number of analgesics prescribed by her rheumatologist.     Review of Systems  Constitutional: Negative.   HENT: Negative for hearing loss, congestion, sore throat, rhinorrhea, dental problem, sinus pressure and tinnitus.   Eyes: Negative for pain, discharge and visual disturbance.  Respiratory: Negative for cough and shortness of breath.   Cardiovascular: Negative for chest pain, palpitations and leg swelling.  Gastrointestinal: Positive for abdominal pain. Negative for nausea, vomiting, diarrhea, constipation, blood in stool and abdominal distention.  Genitourinary: Negative for dysuria, urgency, frequency, hematuria, flank pain, vaginal bleeding, vaginal discharge, difficulty urinating, vaginal pain and pelvic pain.  Musculoskeletal: Positive for back pain and arthralgias. Negative for joint swelling and gait problem.  Skin: Negative for rash.  Neurological: Negative for dizziness, syncope, speech difficulty, weakness, numbness and headaches.  Hematological: Negative for adenopathy.  Psychiatric/Behavioral: Negative for behavioral problems, dysphoric mood and agitation. The patient is not nervous/anxious.          Objective:   Physical Exam  Constitutional: She is oriented to person, place, and time. She appears well-developed and well-nourished.       Overweight. Blood pressure low normal. No distress  HENT:  Head: Normocephalic.  Right Ear: External ear normal.  Left Ear: External ear normal.  Mouth/Throat: Oropharynx is clear and moist.  Eyes: Conjunctivae and EOM are normal. Pupils are equal, round, and reactive to light.  Neck: Normal range of motion. Neck supple. No thyromegaly present.  Cardiovascular: Normal rate, regular rhythm, normal heart sounds and intact distal pulses.   Pulmonary/Chest: Effort normal and breath sounds normal.  Abdominal: Soft. Bowel sounds are normal. She exhibits no mass. There is tenderness.       Mild epigastric tenderness  Musculoskeletal: Normal range of motion.       No active synovitis  Lymphadenopathy:    She has no cervical adenopathy.  Neurological: She is alert and oriented to person, place, and time.  Skin: Skin is warm and dry. No rash noted.  Psychiatric: She has a normal mood and affect. Her behavior is normal.          Assessment & Plan:   No problem-specific assessment & plan notes found for this encounter.  Gastroesophageal reflux disease. We'll increase Protonix to a twice a day regimen. Aggressive antireflux regimen discussed Depression chronic pain syndrome  Laboratory update will be obtained. She'll return in one month for additional lab followup.

## 2011-06-30 NOTE — Patient Instructions (Signed)
Avoids foods high in acid such as tomatoes citrus juices, and spicy foods.  Avoid eating within two hours of lying down or before exercising.  Do not overheat.  Try smaller more frequent meals.  If symptoms persist, elevate the head of her bed 12 inches while sleeping.  Call or return to clinic prn if these symptoms worsen or fail to improve as anticipated.  

## 2011-07-01 ENCOUNTER — Telehealth: Payer: Self-pay

## 2011-07-01 NOTE — Progress Notes (Signed)
Quick Note:  Faxed results - pt aware ______

## 2011-07-01 NOTE — Telephone Encounter (Signed)
Labs faxed to [facility] - fax - [phone]   Phone [phone] Dr. Clent Demark

## 2011-07-31 ENCOUNTER — Other Ambulatory Visit (INDEPENDENT_AMBULATORY_CARE_PROVIDER_SITE_OTHER): Payer: Medicare Other

## 2011-07-31 DIAGNOSIS — G8929 Other chronic pain: Secondary | ICD-10-CM

## 2011-07-31 DIAGNOSIS — IMO0002 Reserved for concepts with insufficient information to code with codable children: Secondary | ICD-10-CM

## 2011-07-31 LAB — CBC WITH DIFFERENTIAL/PLATELET
Basophils Absolute: 0 10*3/uL (ref 0.0–0.1)
Basophils Relative: 0.5 % (ref 0.0–3.0)
Eosinophils Absolute: 0.1 10*3/uL (ref 0.0–0.7)
HCT: 39.2 % (ref 36.0–46.0)
Hemoglobin: 12.7 g/dL (ref 12.0–15.0)
Lymphs Abs: 1.8 10*3/uL (ref 0.7–4.0)
MCHC: 32.4 g/dL (ref 30.0–36.0)
MCV: 94.8 fl (ref 78.0–100.0)
Monocytes Absolute: 0.5 10*3/uL (ref 0.1–1.0)
Neutro Abs: 4 10*3/uL (ref 1.4–7.7)
RBC: 4.14 Mil/uL (ref 3.87–5.11)
RDW: 17.4 % — ABNORMAL HIGH (ref 11.5–14.6)

## 2011-07-31 LAB — HEPATIC FUNCTION PANEL
ALT: 13 U/L (ref 0–35)
Bilirubin, Direct: 0 mg/dL (ref 0.0–0.3)
Total Bilirubin: 0.4 mg/dL (ref 0.3–1.2)

## 2011-07-31 LAB — BASIC METABOLIC PANEL
BUN: 25 mg/dL — ABNORMAL HIGH (ref 6–23)
Calcium: 8.5 mg/dL (ref 8.4–10.5)
Chloride: 113 mEq/L — ABNORMAL HIGH (ref 96–112)
Creatinine, Ser: 1 mg/dL (ref 0.4–1.2)
GFR: 67.08 mL/min (ref >60.00)

## 2011-07-31 LAB — HIGH SENSITIVITY CRP: CRP, High Sensitivity: 3.67 mg/L (ref 0.000–5.000)

## 2011-07-31 NOTE — Progress Notes (Signed)
Addended by: Bonnye Fava on: 07/31/2011 10:47 AM   Modules accepted: Orders

## 2011-08-06 ENCOUNTER — Emergency Department: Payer: Self-pay | Admitting: *Deleted

## 2011-08-13 ENCOUNTER — Ambulatory Visit (INDEPENDENT_AMBULATORY_CARE_PROVIDER_SITE_OTHER): Payer: Medicare Other | Admitting: Internal Medicine

## 2011-08-13 ENCOUNTER — Encounter: Payer: Self-pay | Admitting: Internal Medicine

## 2011-08-13 VITALS — BP 118/70 | Temp 98.2°F | Wt 188.0 lb

## 2011-08-13 DIAGNOSIS — M130 Polyarthritis, unspecified: Secondary | ICD-10-CM

## 2011-08-13 DIAGNOSIS — Z23 Encounter for immunization: Secondary | ICD-10-CM

## 2011-08-13 DIAGNOSIS — R635 Abnormal weight gain: Secondary | ICD-10-CM

## 2011-08-13 DIAGNOSIS — Z0000 Encounter for general adult medical examination without abnormal findings: Secondary | ICD-10-CM

## 2011-08-13 DIAGNOSIS — F329 Major depressive disorder, single episode, unspecified: Secondary | ICD-10-CM

## 2011-08-13 DIAGNOSIS — G8929 Other chronic pain: Secondary | ICD-10-CM

## 2011-08-13 NOTE — Patient Instructions (Signed)
Limit your sodium (Salt) intake     It is important that you exercise regularly, at least 20 minutes 3 to 4 times per week.  If you develop chest pain or shortness of breath seek  medical attention.  Followup with rheumatology visit next month as scheduled  Call or return to clinic prn if these symptoms worsen or fail to improve as anticipated.

## 2011-08-13 NOTE — Progress Notes (Signed)
  Subjective:    [name], female    DOB: 04/26/1966, 45 y.o.   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 She is followed closely by rheumatology in [place] for a lupus-like syndrome with poly arthritis.  She was seen in the ED at [facility] recently due to an acute pain crisis. This was associated with some right arm numbness. She was treated with parenteral narcotics and has been stable over the past 7 days. She is maintained on chronic narcotic maintenance analgesics. Today she feels well. She has a history of depression and has had some significant weight gain associated with treatment.  She anticipates a increase in her Imuran treatment next month and a dose reduction of her prednisone. She also is treated with Abilify as well as Zyprexa. In general her chronic pain has been fairly stable. She feels her anxiety and depression have also been stable   Review of Systems  Constitutional: Positive for unexpected weight change.  HENT: Negative for hearing loss, congestion, sore throat, rhinorrhea, dental problem, sinus pressure and tinnitus.   Eyes: Negative for pain, discharge and visual disturbance.  Respiratory: Negative for cough and shortness of breath.   Cardiovascular: Negative for chest pain, palpitations and leg swelling.  Gastrointestinal: Negative for nausea, vomiting, abdominal pain, diarrhea, constipation, blood in stool and abdominal distention.  Genitourinary: Negative for dysuria, urgency, frequency, hematuria, flank pain, vaginal bleeding, vaginal discharge, difficulty urinating, vaginal pain and pelvic pain.  Musculoskeletal: Positive for back pain and arthralgias. Negative for joint swelling and gait problem.  Skin: Negative for rash.  Neurological: Negative for dizziness, syncope, speech difficulty, weakness, numbness and headaches.  Hematological: Negative for adenopathy.    Psychiatric/Behavioral: Negative for behavioral problems, dysphoric mood and agitation. The patient is not nervous/anxious.        Objective:   Physical Exam  Constitutional: She is oriented to person, place, and time. She appears well-developed and well-nourished. No distress.       Overweight. No distress. Blood pressure 118/76. Afebrile  HENT:  Head: Normocephalic.  Right Ear: External ear normal.  Left Ear: External ear normal.  Mouth/Throat: Oropharynx is clear and moist.  Eyes: Conjunctivae and EOM are normal. Pupils are equal, round, and reactive to light.  Neck: Normal range of motion. Neck supple. No thyromegaly present.  Cardiovascular: Normal rate, regular rhythm, normal heart sounds and intact distal pulses.   Pulmonary/Chest: Effort normal and breath sounds normal.  Abdominal: Soft. Bowel sounds are normal. She exhibits no mass. There is no tenderness.  Musculoskeletal: Normal range of motion.       No active synovitis  Lymphadenopathy:    She has no cervical adenopathy.  Neurological: She is alert and oriented to person, place, and time.  Skin: Skin is warm and dry. No rash noted.  Psychiatric: She has a normal mood and affect. Her behavior is normal.          Assessment & Plan:   Chronic pain syndrome status post ED visit for acute pain crisis. Presently stable and back to baseline. She'll continue to have her analgesics filled by her rheumatologist Lupus-like syndrome with polyarteritis. Per rheumatology  Anxiety depression. Per psychiatry  Patient will return in 3 months or as needed;  followup rheumatology next month as scheduled

## 2011-08-20 LAB — CBC
HCT: 40.5
Platelets: 271
RBC: 4.02
WBC: 5.9

## 2011-08-20 LAB — DIFFERENTIAL
Eosinophils Relative: 0
Lymphocytes Relative: 6 — ABNORMAL LOW
Lymphs Abs: 0.3 — ABNORMAL LOW
Monocytes Relative: 2 — ABNORMAL LOW
Neutrophils Relative %: 92 — ABNORMAL HIGH

## 2011-08-20 LAB — POCT PREGNANCY, URINE
Operator id: 146091
Preg Test, Ur: NEGATIVE

## 2011-08-20 LAB — URINALYSIS, ROUTINE W REFLEX MICROSCOPIC
Bilirubin Urine: NEGATIVE
Glucose, UA: NEGATIVE
Ketones, ur: NEGATIVE
Protein, ur: NEGATIVE

## 2011-08-20 LAB — I-STAT 8, (EC8 V) (CONVERTED LAB)
Acid-base deficit: 5 — ABNORMAL HIGH
Chloride: 108
Hemoglobin: 15
Potassium: 3.9
Sodium: 140
TCO2: 24

## 2011-08-20 LAB — URINE CULTURE
Colony Count: NO GROWTH
Special Requests: POSITIVE

## 2011-08-20 LAB — URINE MICROSCOPIC-ADD ON

## 2011-08-21 ENCOUNTER — Telehealth: Payer: Self-pay | Admitting: *Deleted

## 2011-08-21 DIAGNOSIS — H5710 Ocular pain, unspecified eye: Secondary | ICD-10-CM

## 2011-08-21 NOTE — Telephone Encounter (Signed)
Pt is having pain, redness, burning and blurred vision in one eye.  Wants to know if Dr. Kirtland Bouchard thinks she should see an Opthamalogist.  If so, needs a referral.

## 2011-08-21 NOTE — Telephone Encounter (Signed)
Please  scheduled to see ophthalmology 

## 2011-08-21 NOTE — Telephone Encounter (Signed)
She has been set up today at 1:30pm

## 2011-08-21 NOTE — Telephone Encounter (Signed)
Please  scheduled to see ophthalmology

## 2011-08-21 NOTE — Telephone Encounter (Signed)
noted 

## 2011-08-27 ENCOUNTER — Encounter: Payer: Self-pay | Admitting: Internal Medicine

## 2011-08-27 ENCOUNTER — Ambulatory Visit (INDEPENDENT_AMBULATORY_CARE_PROVIDER_SITE_OTHER): Payer: Medicare Other | Admitting: Internal Medicine

## 2011-08-27 VITALS — BP 110/68 | Wt 184.0 lb

## 2011-08-27 DIAGNOSIS — N390 Urinary tract infection, site not specified: Secondary | ICD-10-CM

## 2011-08-27 DIAGNOSIS — N318 Other neuromuscular dysfunction of bladder: Secondary | ICD-10-CM

## 2011-08-27 DIAGNOSIS — R350 Frequency of micturition: Secondary | ICD-10-CM

## 2011-08-27 DIAGNOSIS — N3010 Interstitial cystitis (chronic) without hematuria: Secondary | ICD-10-CM

## 2011-08-27 LAB — POCT URINALYSIS DIPSTICK
Blood, UA: NEGATIVE
Glucose, UA: NEGATIVE
Spec Grav, UA: 1.025
Urobilinogen, UA: 2
pH, UA: 6

## 2011-08-27 MED ORDER — CIPROFLOXACIN HCL 500 MG PO TABS: 500.0000 mg | ORAL_TABLET | Freq: Two times a day (BID) | ORAL | Status: AC

## 2011-08-27 NOTE — Patient Instructions (Signed)
Drink as much fluid as you  can tolerate over the next few days  Take your antibiotic as prescribed until ALL of it is gone, but stop if you develop a rash, swelling, or any side effects of the medication.  Contact our office as soon as possible if  there are side effects of the medication.  Call or return to clinic prn if these symptoms worsen or fail to improve as anticipated.   

## 2011-08-27 NOTE — Progress Notes (Signed)
  Subjective:    [name], female    DOB: 14-Dec-1965, 45 y.o.   MRN: [id number]  HPI  45 year old patient who is seen today for followup. She has both a history of interstitial cystitis and also overactive bladder she has been on VESIcare 10 mg daily for the past several days she's noted increased urinary frequency associated with dysuria denies any fever or flank pain. She has had the occasional bladder infections in the past. Her urinalysis was reviewed and revealed trace leukocytes    Review of Systems  Constitutional: Negative for fever.  Genitourinary: Positive for dysuria and frequency. Negative for flank pain.       Objective:   Physical Exam  Constitutional: She appears well-developed and well-nourished. No distress.  Abdominal: Soft. Bowel sounds are normal.          Assessment & Plan:   Mild UTI. Will treat with Cipro 500 twice a day for 3 days We'll call there is no symptomatic improvement or if she develops fever

## 2011-09-11 LAB — COMPREHENSIVE METABOLIC PANEL
ALT: 15
AST: 14
Albumin: 3 — ABNORMAL LOW
Alkaline Phosphatase: 44
BUN: 24 — ABNORMAL HIGH
CO2: 22
Calcium: 8.1 — ABNORMAL LOW
Chloride: 110
Total Protein: 5.2 — ABNORMAL LOW

## 2011-09-11 LAB — C-REACTIVE PROTEIN: CRP: 0.1 — ABNORMAL LOW (ref <0.6)

## 2011-09-11 LAB — CBC
HCT: 33.9 — ABNORMAL LOW
Hemoglobin: 11.4 — ABNORMAL LOW
Hemoglobin: 12.7
MCHC: 33.8
MCV: 97.3
MCV: 97.7
RBC: 3.48 — ABNORMAL LOW
RBC: 3.85 — ABNORMAL LOW
RDW: 16.9 — ABNORMAL HIGH
WBC: 6

## 2011-09-11 LAB — URINALYSIS, ROUTINE W REFLEX MICROSCOPIC
Glucose, UA: NEGATIVE
Hgb urine dipstick: NEGATIVE
Protein, ur: NEGATIVE
Specific Gravity, Urine: 1.021

## 2011-09-11 LAB — DIFFERENTIAL
Basophils Relative: 0
Eosinophils Absolute: 0
Eosinophils Relative: 0
Lymphocytes Relative: 5 — ABNORMAL LOW
Lymphs Abs: 0.3 — ABNORMAL LOW
Monocytes Absolute: 0.3
Monocytes Relative: 4
Monocytes Relative: 5
Neutrophils Relative %: 83 — ABNORMAL HIGH

## 2011-09-11 LAB — I-STAT 8, (EC8 V) (CONVERTED LAB)
Bicarbonate: 21.6
Glucose, Bld: 94
TCO2: 23
pH, Ven: 7.314 — ABNORMAL HIGH

## 2011-09-11 LAB — SEDIMENTATION RATE: Sed Rate: 8

## 2011-09-15 LAB — CBC
HCT: 37.8
Hemoglobin: 12.6
MCV: 98.3
RDW: 17.7 — ABNORMAL HIGH

## 2011-09-15 LAB — COMPREHENSIVE METABOLIC PANEL
Alkaline Phosphatase: 57
BUN: 16
Chloride: 108
Creatinine, Ser: 0.61
Glucose, Bld: 102 — ABNORMAL HIGH
Potassium: 4.2
Total Bilirubin: 0.9
Total Protein: 6

## 2011-09-15 LAB — DIFFERENTIAL
Basophils Relative: 0
Eosinophils Absolute: 0.1
Neutrophils Relative %: 83 — ABNORMAL HIGH

## 2011-09-15 LAB — MAGNESIUM: Magnesium: 2.2

## 2011-09-15 LAB — SEDIMENTATION RATE: Sed Rate: 2

## 2011-09-15 LAB — C-REACTIVE PROTEIN: CRP: 0.2 — ABNORMAL LOW (ref <0.6)

## 2011-09-16 LAB — I-STAT 8, (EC8 V) (CONVERTED LAB)
Bicarbonate: 21.5
Glucose, Bld: 100 — ABNORMAL HIGH
Hemoglobin: 13.3
Operator id: 189501
Sodium: 139
TCO2: 23

## 2011-09-16 LAB — DIFFERENTIAL
Basophils Absolute: 0
Basophils Relative: 0
Eosinophils Absolute: 0.1
Neutrophils Relative %: 73

## 2011-09-16 LAB — CBC
MCHC: 31.5
MCV: 96.1
Platelets: 312
RDW: 17.1 — ABNORMAL HIGH

## 2011-10-07 ENCOUNTER — Observation Stay: Payer: Self-pay | Admitting: Internal Medicine

## 2011-10-20 ENCOUNTER — Encounter: Payer: Self-pay | Admitting: Internal Medicine

## 2011-10-20 ENCOUNTER — Ambulatory Visit (INDEPENDENT_AMBULATORY_CARE_PROVIDER_SITE_OTHER): Payer: Medicare Other | Admitting: Internal Medicine

## 2011-10-20 DIAGNOSIS — E039 Hypothyroidism, unspecified: Secondary | ICD-10-CM

## 2011-10-20 DIAGNOSIS — M130 Polyarthritis, unspecified: Secondary | ICD-10-CM

## 2011-10-20 DIAGNOSIS — F411 Generalized anxiety disorder: Secondary | ICD-10-CM

## 2011-10-20 DIAGNOSIS — M359 Systemic involvement of connective tissue, unspecified: Secondary | ICD-10-CM

## 2011-10-20 DIAGNOSIS — I776 Arteritis, unspecified: Secondary | ICD-10-CM

## 2011-10-20 DIAGNOSIS — G8929 Other chronic pain: Secondary | ICD-10-CM

## 2011-10-20 LAB — CBC WITH DIFFERENTIAL/PLATELET
Basophils Relative: 0 % (ref 0.0–3.0)
Eosinophils Relative: 0 % (ref 0.0–5.0)
HCT: 40.8 % (ref 36.0–46.0)
Lymphs Abs: 0.6 10*3/uL — ABNORMAL LOW (ref 0.7–4.0)
MCV: 93.8 fl (ref 78.0–100.0)
Monocytes Absolute: 0.2 10*3/uL (ref 0.1–1.0)
Platelets: 247 10*3/uL (ref 150.0–400.0)
WBC: 10.7 10*3/uL — ABNORMAL HIGH (ref 4.5–10.5)

## 2011-10-20 LAB — HEPATIC FUNCTION PANEL
ALT: 21 U/L (ref 0–35)
Albumin: 4 g/dL (ref 3.5–5.2)
Total Bilirubin: 0.4 mg/dL (ref 0.3–1.2)
Total Protein: 7.1 g/dL (ref 6.0–8.3)

## 2011-10-20 MED ORDER — PREDNISONE 5 MG PO TABS: 5.0000 mg | ORAL_TABLET | Freq: Every day | ORAL | Status: DC

## 2011-10-20 NOTE — Patient Instructions (Signed)
Limit your sodium (Salt) intake    It is important that you exercise regularly, at least 20 minutes 3 to 4 times per week.  If you develop chest pain or shortness of breath seek  medical attention.  Take a calcium supplement, plus 800-1200 units of vitamin D  Return in 6 months for follow-up  

## 2011-10-20 NOTE — Progress Notes (Signed)
  Subjective:    [name], female    DOB: 1966-07-09, 45 y.o.   MRN: [id number]  HPI  45 year old patient who has a history of chronic pain and probably arthritis. She is seen today post hospital discharge from [facility] where she was admitted for 4 days for acute pain crisis. She was discharged 11 days ago on a prednisone dose pack. She is on a maintenance dose of prednisone 10 mg daily and presently has been down titrated to 15 mg daily. Her rheumatologist has increased Imuran to 100 mg daily. She has been under considerable situational stress. Her daughter presently is hospitalized in a psychiatric facility after a suicide gesture. The patient does have ongoing therapy with a counselor. Since her hospital discharge she has been receiving home physical therapy with benefit. She continues to improve slowly. She is on multiple chronic medications for her chronic pain syndrome.    Review of Systems  Constitutional: Negative.   HENT: Negative for hearing loss, congestion, sore throat, rhinorrhea, dental problem, sinus pressure and tinnitus.   Eyes: Negative for pain, discharge and visual disturbance.  Respiratory: Negative for cough and shortness of breath.   Cardiovascular: Negative for chest pain, palpitations and leg swelling.  Gastrointestinal: Negative for nausea, vomiting, abdominal pain, diarrhea, constipation, blood in stool and abdominal distention.  Genitourinary: Negative for dysuria, urgency, frequency, hematuria, flank pain, vaginal bleeding, vaginal discharge, difficulty urinating, vaginal pain and pelvic pain.  Musculoskeletal: Positive for back pain, joint swelling and arthralgias. Negative for gait problem.  Skin: Negative for rash.  Neurological: Negative for dizziness, syncope, speech difficulty, weakness, numbness and headaches.  Hematological: Negative for adenopathy.  Psychiatric/Behavioral: Negative for behavioral problems, dysphoric mood and  agitation. The patient is not nervous/anxious.        Objective:   Physical Exam  Constitutional: She is oriented to person, place, and time. She appears well-developed and well-nourished.  HENT:  Head: Normocephalic.  Right Ear: External ear normal.  Left Ear: External ear normal.  Mouth/Throat: Oropharynx is clear and moist.  Eyes: Conjunctivae and EOM are normal. Pupils are equal, round, and reactive to light.  Neck: Normal range of motion. Neck supple. No thyromegaly present.  Cardiovascular: Normal rate, regular rhythm, normal heart sounds and intact distal pulses.   Pulmonary/Chest: Effort normal and breath sounds normal.  Abdominal: Soft. Bowel sounds are normal. She exhibits no mass. There is no tenderness.  Musculoskeletal: Normal range of motion.       No active synovitis  Lymphadenopathy:    She has no cervical adenopathy.  Neurological: She is alert and oriented to person, place, and time.  Skin: Skin is warm and dry. No rash noted.  Psychiatric: She has a normal mood and affect. Her behavior is normal.          Assessment & Plan:   Chronic pain syndrome status post acute pain crisis Poly arthritis. We'll followup her chemistries and CBC today Osteopenia Hypothyroidism Chronic anxiety depression  Medical regimen unchanged medications refilled We'll recheck in 6 months or as needed. She is followed by rheumatology who handles all her narcotics

## 2011-11-01 ENCOUNTER — Emergency Department: Payer: Self-pay | Admitting: Emergency Medicine

## 2011-11-02 ENCOUNTER — Emergency Department: Payer: Self-pay | Admitting: Emergency Medicine

## 2011-11-03 ENCOUNTER — Telehealth: Payer: Self-pay

## 2011-11-03 NOTE — Telephone Encounter (Signed)
ok 

## 2011-11-03 NOTE — Telephone Encounter (Signed)
[name] [name] called requesting an order for a medical social worker and Set designer.  [name] [name] would like Psych nurse to visit 2 times a week for 3 weeks.  Pt has a psychiatrist but does not see them for another month or so.  Pt does have physical therapy for the next 2 weeks.  Pls advise.

## 2011-11-03 NOTE — Telephone Encounter (Signed)
Spoke with cindy - order given

## 2011-11-13 ENCOUNTER — Ambulatory Visit (INDEPENDENT_AMBULATORY_CARE_PROVIDER_SITE_OTHER): Payer: Medicare Other | Admitting: Internal Medicine

## 2011-11-13 ENCOUNTER — Encounter: Payer: Self-pay | Admitting: Internal Medicine

## 2011-11-13 DIAGNOSIS — L0201 Cutaneous abscess of face: Secondary | ICD-10-CM

## 2011-11-13 DIAGNOSIS — L03211 Cellulitis of face: Secondary | ICD-10-CM

## 2011-11-13 DIAGNOSIS — M130 Polyarthritis, unspecified: Secondary | ICD-10-CM

## 2011-11-13 MED ORDER — CEPHALEXIN 500 MG PO CAPS: 500.0000 mg | ORAL_CAPSULE | Freq: Four times a day (QID) | ORAL | Status: AC

## 2011-11-13 NOTE — Patient Instructions (Signed)
Take your antibiotic as prescribed until ALL of it is gone, but stop if you develop a rash, swelling, or any side effects of the medication.  Contact our office as soon as possible if  there are side effects of the medication.  Call or return to clinic prn if these symptoms worsen or fail to improve as anticipated.  

## 2011-11-13 NOTE — Progress Notes (Signed)
  Subjective:    [name], female    DOB: [birth year], 45 y.o.   MRN: [id number]  HPI  45 year old patient who presents with a 2 to three-day history of right facial pain. She did have a dental appointment and dental cleansing the late last week. There's been no fever. She describes some mild gingival discomfort her prominent symptom is pain and swelling in the right facial region. She has a history of rheumatologic disorder and has been on chronic immunosuppressive therapy. She does have a penicillin allergy but has used cephalosporins without difficulty    Review of Systems  Constitutional: Negative.  Negative for fever and chills.  HENT: Negative for hearing loss, congestion, sore throat, rhinorrhea, dental problem, sinus pressure and tinnitus.   Eyes: Negative for pain, discharge and visual disturbance.  Respiratory: Negative for cough and shortness of breath.   Cardiovascular: Negative for chest pain, palpitations and leg swelling.  Gastrointestinal: Negative for nausea, vomiting, abdominal pain, diarrhea, constipation, blood in stool and abdominal distention.  Genitourinary: Negative for dysuria, urgency, frequency, hematuria, flank pain, vaginal bleeding, vaginal discharge, difficulty urinating, vaginal pain and pelvic pain.  Musculoskeletal: Negative for joint swelling, arthralgias and gait problem.  Skin: Positive for rash.  Neurological: Negative for dizziness, syncope, speech difficulty, weakness, numbness and headaches.  Hematological: Negative for adenopathy.  Psychiatric/Behavioral: Negative for behavioral problems, dysphoric mood and agitation. The patient is not nervous/anxious.        Objective:   Physical Exam  Constitutional: She appears well-developed and well-nourished. No distress.       Afebrile. No acute distress  HENT:       Gingiva appeared normal  Skin:       Soft tissue swelling and mild tenderness in the right malar and facial region.  Mild tenderness in the right perirectal area but no definite adenopathy. No submandibular adenopathy          Assessment & Plan:   Early facial cellulitis. We'll treat with cephalexin warm compresses. Will call if unimproved

## 2011-11-23 ENCOUNTER — Telehealth: Payer: Self-pay

## 2011-11-23 MED ORDER — FLUCONAZOLE 150 MG PO TABS: 150.0000 mg | ORAL_TABLET | Freq: Once | ORAL | Status: AC

## 2011-11-23 NOTE — Telephone Encounter (Signed)
Rx sent to pharmacy   

## 2011-11-23 NOTE — Telephone Encounter (Signed)
Diflucan 150  #1  OK

## 2011-11-23 NOTE — Telephone Encounter (Signed)
Pt has a yeast infection due to antibiotics she was on for treatment of facial cellulitis.  Pt would like diflucan called in to pharmacy.  Pls advise.

## 2011-11-25 ENCOUNTER — Other Ambulatory Visit (INDEPENDENT_AMBULATORY_CARE_PROVIDER_SITE_OTHER): Payer: Medicare Other

## 2011-11-25 DIAGNOSIS — M130 Polyarthritis, unspecified: Secondary | ICD-10-CM

## 2011-11-25 LAB — CBC WITH DIFFERENTIAL/PLATELET
Eosinophils Relative: 1.6 % (ref 0.0–5.0)
HCT: 34.1 % — ABNORMAL LOW (ref 36.0–46.0)
Hemoglobin: 11.2 g/dL — ABNORMAL LOW (ref 12.0–15.0)
Lymphs Abs: 1.2 10*3/uL (ref 0.7–4.0)
Monocytes Relative: 5.4 % (ref 3.0–12.0)
Neutro Abs: 5.7 10*3/uL (ref 1.4–7.7)
RBC: 3.73 Mil/uL — ABNORMAL LOW (ref 3.87–5.11)
WBC: 7.5 10*3/uL (ref 4.5–10.5)

## 2011-11-25 LAB — HEPATIC FUNCTION PANEL
ALT: 18 U/L (ref 0–35)
AST: 20 U/L (ref 0–37)
Albumin: 3.5 g/dL (ref 3.5–5.2)
Alkaline Phosphatase: 54 U/L (ref 39–117)

## 2011-11-25 LAB — BASIC METABOLIC PANEL
GFR: 70.4 mL/min (ref >60.00)
Potassium: 3.8 mEq/L (ref 3.5–5.1)
Sodium: 143 mEq/L (ref 135–145)

## 2011-11-27 ENCOUNTER — Other Ambulatory Visit: Payer: Self-pay

## 2011-11-27 MED ORDER — TOPIRAMATE 100 MG PO TABS: 100.0000 mg | ORAL_TABLET | Freq: Two times a day (BID) | ORAL | Status: DC

## 2011-12-02 ENCOUNTER — Encounter: Payer: Self-pay | Admitting: Internal Medicine

## 2011-12-02 ENCOUNTER — Ambulatory Visit (INDEPENDENT_AMBULATORY_CARE_PROVIDER_SITE_OTHER): Payer: Medicare Other | Admitting: Internal Medicine

## 2011-12-02 DIAGNOSIS — R079 Chest pain, unspecified: Secondary | ICD-10-CM

## 2011-12-02 DIAGNOSIS — G8929 Other chronic pain: Secondary | ICD-10-CM

## 2011-12-02 DIAGNOSIS — F411 Generalized anxiety disorder: Secondary | ICD-10-CM

## 2011-12-02 NOTE — Progress Notes (Signed)
  Subjective:    [name], female    DOB: 09-24-1966, 46 y.o.   MRN: [id number]  HPI  46 year old patient who has a porta defined rheumatologic disorder and chronic pain syndrome. For the past 5 days she has had left anterior chest pain. This is described as a gripping sensation that radiates to her left scapula she states the pain is aggravated by deep inspiration and movement. Associated symptoms included mild shortness of breath. There's been no nausea or diaphoresis. She complains of some mild dyspnea on exertion. No URI symptoms. She states the pain lasts 2 or 3 minutes but then reoccurs frequent throughout the day. Occasionally wakes her up from sleep. She has been using anti-inflammatories without much benefit. She is on chronic narcotic analgesics.    Review of Systems  Respiratory: Positive for shortness of breath.   Cardiovascular: Positive for chest pain. Negative for palpitations and leg swelling.       Objective:   Physical Exam  Constitutional: She is oriented to person, place, and time. She appears well-developed and well-nourished. No distress.  HENT:  Head: Normocephalic.  Right Ear: External ear normal.  Left Ear: External ear normal.  Mouth/Throat: Oropharynx is clear and moist.  Eyes: Conjunctivae and EOM are normal. Pupils are equal, round, and reactive to light.  Neck: Normal range of motion. Neck supple. No thyromegaly present.  Cardiovascular: Normal rate, regular rhythm, normal heart sounds and intact distal pulses.   Pulmonary/Chest: Effort normal and breath sounds normal.       No chest wall tenderness O2 saturation 99%  Abdominal: Soft. Bowel sounds are normal. She exhibits no mass. There is no tenderness.  Musculoskeletal: Normal range of motion.  Lymphadenopathy:    She has no cervical adenopathy.  Neurological: She is alert and oriented to person, place, and time.  Skin: Skin is warm and dry. No rash noted.  Psychiatric: She has  a normal mood and affect. Her behavior is normal.          Assessment & Plan:   Left-sided chest pain. EKG was obtained and was normal her clinical examination is noncontributory. We'll check a d-dimer and clinically observe. Chronic pain syndrome History of anxiety

## 2011-12-02 NOTE — Patient Instructions (Signed)
Limit your sodium (Salt) intake  Call or return to clinic prn if these symptoms worsen or fail to improve as anticipated.   

## 2011-12-03 LAB — D-DIMER, QUANTITATIVE: D-Dimer, Quant: 0.39 ug/mL-FEU (ref 0.00–0.48)

## 2011-12-09 ENCOUNTER — Telehealth: Payer: Self-pay | Admitting: *Deleted

## 2011-12-09 ENCOUNTER — Ambulatory Visit (INDEPENDENT_AMBULATORY_CARE_PROVIDER_SITE_OTHER): Payer: Medicare Other | Admitting: Family Medicine

## 2011-12-09 ENCOUNTER — Encounter: Payer: Self-pay | Admitting: Family Medicine

## 2011-12-09 ENCOUNTER — Ambulatory Visit (INDEPENDENT_AMBULATORY_CARE_PROVIDER_SITE_OTHER)
Admission: RE | Admit: 2011-12-09 | Discharge: 2011-12-09 | Disposition: A | Discharge status: Home / Self Care | Payer: Medicare Other | Source: Ambulatory Visit | Attending: Family Medicine | Admitting: Family Medicine

## 2011-12-09 DIAGNOSIS — R079 Chest pain, unspecified: Secondary | ICD-10-CM

## 2011-12-09 NOTE — Patient Instructions (Signed)
Increase prednisone to 4 daily for 3 days then 3 daily for 3 days then 2 daily for 3 days then resume one daily

## 2011-12-09 NOTE — Telephone Encounter (Signed)
Increasing chest pain since seeing Dr. Kirtland Bouchard 12/02/2011.  Will come today and see Dr. Caryl Never.

## 2011-12-09 NOTE — Progress Notes (Signed)
  Subjective:    [name], female    DOB: 1965/12/22, 46 y.o.   MRN: [id number]  HPI  Patient seen with ongoing left sided chest pain. Refer to prior dictation. Poorly defined polyarthritis/vasculitis followed by rheumatologist in [place]. Maintained on his prednisone 10 mg daily. Onset around December 29 left sided chest pain off and on. Worse with movement and inspiration. No cough. No fever or chills. Some radiation towards scapula. Some dyspnea with activity though not hypoxic. EKG and d-dimer were normal last visit. Symptoms possibly slightly worse since last visit. Question of pleurisy.  Patient has multiple other chronic medical problems including reported interstitial cystitis, chronic pain syndrome, kidney stones.  Past Medical History  Diagnosis Date  . ANXIETY 06/06/2007  . BACK PAIN 02/22/2009  . DEPRESSION 06/06/2007  . FREQUENCY, URINARY 07/07/2007  . GERD 06/06/2007  . HYPOTHYROIDISM 06/06/2007  . INTERSTITIAL CYSTITIS 09/11/2010  . NEPHROLITHIASIS, HX OF 11/05/2010  . OSTEOPENIA 10/14/2009  . OVERACTIVE BLADDER 10/14/2009  . PAIN, CHRONIC NEC 06/06/2007  . POLYARTHRITIS 08/19/2007  . 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 WEIGHT GAIN 02/22/2009   Past Surgical History  Procedure Date  . Appendectomy   . Tonsillectomy   . Wrist reconstruction   . 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 She does not have any smokeless tobacco history on file. She reports that she does not drink alcohol or use illicit drugs. family history includes Arthritis in her mother; Asthma in her daughter and sister; and Hypertension in her mother. Allergies  Allergen Reactions  . Clarithromycin   . Latex   . Penicillins   . Prasterone (Dhea)   . Promethazine Hcl       Review of Systems  Constitutional: Negative for fever, chills, appetite change and unexpected weight change.  Respiratory: Positive for shortness of breath.  Negative for cough and wheezing.   Cardiovascular: Positive for chest pain. Negative for palpitations and leg swelling.  Gastrointestinal: Negative for abdominal pain and blood in stool.  Skin: Negative for rash.  Neurological: Negative for dizziness, syncope, weakness and headaches.  Hematological: Negative for adenopathy.       Objective:   Physical Exam  Constitutional: She appears well-developed and well-nourished.  HENT:  Right Ear: External ear normal.  Left Ear: External ear normal.  Mouth/Throat: Oropharynx is clear and moist.  Neck: Neck supple. No thyromegaly present.  Cardiovascular: Normal rate, regular rhythm and normal heart sounds.   No murmur heard. Pulmonary/Chest: Effort normal and breath sounds normal. No respiratory distress. She has no wheezes. She has no rales.  Abdominal: Soft. There is no tenderness.  Musculoskeletal: She exhibits no edema.  Lymphadenopathy:    She has no cervical adenopathy.  Skin: No rash noted.          Assessment & Plan:  Left chest pain. Differential includes pleurisy-type pain versus musculoskeletal. Recent d-dimer negative. Recent EKG unremarkable. No hypoxemia. Obtain chest x-ray. Increase prednisone dose and then taper back to her usual dose of 10 mg daily.

## 2011-12-10 NOTE — Progress Notes (Signed)
Quick Note:  Pt aware ______ 

## 2011-12-15 ENCOUNTER — Telehealth: Payer: Self-pay | Admitting: *Deleted

## 2011-12-15 NOTE — Telephone Encounter (Signed)
Spoke with pt- informed of dr. kwiatkowski's instructions 

## 2011-12-15 NOTE — Telephone Encounter (Signed)
Suggest increase prednisone  to 30 mg for one week, then taper down to 20 mg per week and is stable on this dose further taper to 10 mg daily

## 2011-12-15 NOTE — Telephone Encounter (Signed)
Pt has tapered her Prednisone to 20 mg, and the chest pain has come back.  She was asymptomatic with the 40 mg.

## 2011-12-18 ENCOUNTER — Emergency Department: Payer: Self-pay | Admitting: *Deleted

## 2011-12-18 LAB — TROPONIN I: Troponin-I: <0.02 ng/mL

## 2011-12-18 LAB — CBC
HCT: 36.3 % (ref 35.0–47.0)
HGB: 11.9 g/dL — ABNORMAL LOW (ref 12.0–16.0)
MCH: 29.8 pg (ref 26.0–34.0)
MCHC: 32.9 g/dL (ref 32.0–36.0)
MCV: 91 fL (ref 80–100)
Platelet: 247 10*3/uL (ref 150–440)
RBC: 4.01 10*6/uL (ref 3.80–5.20)

## 2011-12-18 LAB — COMPREHENSIVE METABOLIC PANEL
Albumin: 3.6 g/dL (ref 3.4–5.0)
Anion Gap: 11 (ref 7–16)
Bilirubin,Total: 0.2 mg/dL (ref 0.2–1.0)
Chloride: 108 mmol/L — ABNORMAL HIGH (ref 98–107)
Co2: 26 mmol/L (ref 21–32)
EGFR (African American): >60
EGFR (Non-African Amer.): >60
Potassium: 4.1 mmol/L (ref 3.5–5.1)
SGOT(AST): 14 U/L — ABNORMAL LOW (ref 15–37)
SGPT (ALT): 27 U/L

## 2012-02-02 ENCOUNTER — Encounter: Payer: Self-pay | Admitting: Internal Medicine

## 2012-02-02 ENCOUNTER — Ambulatory Visit (INDEPENDENT_AMBULATORY_CARE_PROVIDER_SITE_OTHER): Payer: Medicare Other | Admitting: Internal Medicine

## 2012-02-02 ENCOUNTER — Emergency Department: Payer: Self-pay | Admitting: Emergency Medicine

## 2012-02-02 DIAGNOSIS — M130 Polyarthritis, unspecified: Secondary | ICD-10-CM

## 2012-02-02 DIAGNOSIS — G8929 Other chronic pain: Secondary | ICD-10-CM

## 2012-02-02 DIAGNOSIS — F411 Generalized anxiety disorder: Secondary | ICD-10-CM

## 2012-02-02 NOTE — Patient Instructions (Signed)
Consider Benadryl every 6 hours or a nonsedating antihistamine such as Alavert or Claritin Continue prednisone daily  Call or return to clinic prn if these symptoms worsen or fail to improve as anticipated.

## 2012-02-02 NOTE — Progress Notes (Signed)
  Subjective:    [name], female    DOB: 1966/04/20, 46 y.o.   MRN: [id number]  HPI  45 year old patient has a history of chronic pain she has a poorly defined rheumatologic disorder with polyarteritis and history of optic neuritis. She will this morning with some puffiness about the eyes and facial swelling. She also described increasing pain about the hands fingers and knees. She was [facility]  hospital ER and received an injection of Decadron.  She is quite upset about the care she received in the ED but her facial swelling she feels has improved. She has been on chronic low-dose prednisone for her rheumatologic disorder.    Review of Systems  Constitutional: Negative.   HENT: Negative for hearing loss, congestion, sore throat, rhinorrhea, dental problem, sinus pressure and tinnitus.   Eyes: Negative for pain, discharge and visual disturbance.  Respiratory: Negative for cough and shortness of breath.   Cardiovascular: Negative for chest pain, palpitations and leg swelling.  Gastrointestinal: Negative for nausea, vomiting, abdominal pain, diarrhea, constipation, blood in stool and abdominal distention.  Genitourinary: Negative for dysuria, urgency, frequency, hematuria, flank pain, vaginal bleeding, vaginal discharge, difficulty urinating, vaginal pain and pelvic pain.  Musculoskeletal: Negative for joint swelling, arthralgias and gait problem.  Skin: Negative for rash.       Complaint of facial swelling as well as swelling and pain involving the small joints of the hands and both knees  Neurological: Negative for dizziness, syncope, speech difficulty, weakness, numbness and headaches.  Hematological: Negative for adenopathy.  Psychiatric/Behavioral: Negative for behavioral problems, dysphoric mood and agitation. The patient is not nervous/anxious.        Objective:   Physical Exam  Constitutional: She is oriented to person, place, and time. She appears  well-developed and well-nourished.  HENT:  Head: Normocephalic.  Right Ear: External ear normal.  Left Ear: External ear normal.  Mouth/Throat: Oropharynx is clear and moist.  Eyes: Conjunctivae and EOM are normal. Pupils are equal, round, and reactive to light.  Neck: Normal range of motion. Neck supple. No thyromegaly present.  Cardiovascular: Normal rate, regular rhythm, normal heart sounds and intact distal pulses.   Pulmonary/Chest: Effort normal and breath sounds normal.  Abdominal: Soft. Bowel sounds are normal. She exhibits no mass. There is no tenderness.  Musculoskeletal: Normal range of motion.  Lymphadenopathy:    She has no cervical adenopathy.  Neurological: She is alert and oriented to person, place, and time.  Skin: Skin is warm and dry. No rash noted.       There was a suggestion of some mild upper lid edema. There was some generalized ratio puffiness but not striking. Examination of hands reveal no signs of active synovitis. Both knees were cool to touch and revealed no active inflammatory changes  Psychiatric: She has a normal mood and affect. Her behavior is normal.          Assessment & Plan:   History of facial swelling Polyarthritis  The patient has received an injection of Decadron. She is on low-dose prednisone. We'll suggest a nonsedating antihistamine Will return here when necessary

## 2012-02-12 ENCOUNTER — Encounter: Payer: Self-pay | Admitting: Internal Medicine

## 2012-02-12 ENCOUNTER — Ambulatory Visit (INDEPENDENT_AMBULATORY_CARE_PROVIDER_SITE_OTHER): Payer: Medicare Other | Admitting: Internal Medicine

## 2012-02-12 VITALS — BP 120/80 | Temp 98.3°F | Wt 233.0 lb

## 2012-02-12 DIAGNOSIS — H6120 Impacted cerumen, unspecified ear: Secondary | ICD-10-CM

## 2012-02-12 DIAGNOSIS — M130 Polyarthritis, unspecified: Secondary | ICD-10-CM

## 2012-02-12 DIAGNOSIS — G8929 Other chronic pain: Secondary | ICD-10-CM

## 2012-02-12 LAB — COMPREHENSIVE METABOLIC PANEL
ALT: 32 U/L (ref 0–35)
BUN: 24 mg/dL — ABNORMAL HIGH (ref 6–23)
CO2: 20 mEq/L (ref 19–32)
Creatinine, Ser: 0.7 mg/dL (ref 0.4–1.2)
GFR: 93.66 mL/min (ref >60.00)
Total Bilirubin: 0 mg/dL — ABNORMAL LOW (ref 0.3–1.2)

## 2012-02-12 LAB — CBC WITH DIFFERENTIAL/PLATELET
Basophils Relative: 0 % (ref 0.0–3.0)
Eosinophils Absolute: 0.2 10*3/uL (ref 0.0–0.7)
Eosinophils Relative: 2 % (ref 0.0–5.0)
Hemoglobin: 11.7 g/dL — ABNORMAL LOW (ref 12.0–15.0)
Lymphocytes Relative: 15.7 % (ref 12.0–46.0)
MCHC: 31.9 g/dL (ref 30.0–36.0)
MCV: 87.5 fl (ref 78.0–100.0)
Neutro Abs: 7.5 10*3/uL (ref 1.4–7.7)
RBC: 4.2 Mil/uL (ref 3.87–5.11)
WBC: 10.1 10*3/uL (ref 4.5–10.5)

## 2012-02-12 NOTE — Progress Notes (Signed)
  Subjective:    [name], female    DOB: 05/27/1966, 46 y.o.   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 She presents today complaining of fullness and discomfort in the left ear. No fever or URI symptoms. In general done quite well.   Review of Systems  Constitutional: Negative.   HENT: Positive for hearing loss. Negative for congestion, sore throat, rhinorrhea, dental problem, sinus pressure and tinnitus.   Eyes: Negative for pain, discharge and visual disturbance.  Respiratory: Negative for cough and shortness of breath.   Cardiovascular: Negative for chest pain, palpitations and leg swelling.  Gastrointestinal: Negative for nausea, vomiting, abdominal pain, diarrhea, constipation, blood in stool and abdominal distention.  Genitourinary: Negative for dysuria, urgency, frequency, hematuria, flank pain, vaginal bleeding, vaginal discharge, difficulty urinating, vaginal pain and pelvic pain.  Musculoskeletal: Negative for joint swelling, arthralgias and gait problem.  Skin: Negative for rash.  Neurological: Negative for dizziness, syncope, speech difficulty, weakness, numbness and headaches.  Hematological: Negative for adenopathy.  Psychiatric/Behavioral: Negative for behavioral problems, dysphoric mood and agitation. The patient is not nervous/anxious.        Objective:   Physical Exam  Constitutional: She appears well-developed and well-nourished. No distress.  HENT:  Right Ear: External ear normal.  Left Ear: External ear normal.  Mouth/Throat: Oropharynx is clear and moist.       Left cerumen impaction right ear normal Weber  lateralized to the right          Assessment & Plan:   Cerumen impaction left ear. Will irrigate till clear

## 2012-02-12 NOTE — Patient Instructions (Signed)
Call or return to clinic prn if these symptoms worsen or fail to improve as anticipated.

## 2012-02-13 LAB — C-REACTIVE PROTEIN: CRP: 1.83 mg/dL — ABNORMAL HIGH (ref <0.60)

## 2012-02-21 ENCOUNTER — Other Ambulatory Visit: Payer: Self-pay | Admitting: Internal Medicine

## 2012-03-29 ENCOUNTER — Other Ambulatory Visit (INDEPENDENT_AMBULATORY_CARE_PROVIDER_SITE_OTHER): Payer: Medicare Other

## 2012-03-29 DIAGNOSIS — G8929 Other chronic pain: Secondary | ICD-10-CM

## 2012-03-29 DIAGNOSIS — D649 Anemia, unspecified: Secondary | ICD-10-CM

## 2012-03-29 DIAGNOSIS — R7982 Elevated C-reactive protein (CRP): Secondary | ICD-10-CM

## 2012-03-29 DIAGNOSIS — E46 Unspecified protein-calorie malnutrition: Secondary | ICD-10-CM

## 2012-03-29 DIAGNOSIS — I10 Essential (primary) hypertension: Secondary | ICD-10-CM

## 2012-03-29 DIAGNOSIS — R635 Abnormal weight gain: Secondary | ICD-10-CM

## 2012-03-29 DIAGNOSIS — M130 Polyarthritis, unspecified: Secondary | ICD-10-CM

## 2012-03-29 LAB — CBC WITH DIFFERENTIAL/PLATELET
Basophils Relative: 0.4 % (ref 0.0–3.0)
Eosinophils Absolute: 0.2 10*3/uL (ref 0.0–0.7)
HCT: 36.2 % (ref 36.0–46.0)
Hemoglobin: 11.8 g/dL — ABNORMAL LOW (ref 12.0–15.0)
Lymphs Abs: 1.2 10*3/uL (ref 0.7–4.0)
MCHC: 32.5 g/dL (ref 30.0–36.0)
MCV: 87.6 fl (ref 78.0–100.0)
Monocytes Absolute: 0.2 10*3/uL (ref 0.1–1.0)
Neutro Abs: 4.9 10*3/uL (ref 1.4–7.7)
RBC: 4.13 Mil/uL (ref 3.87–5.11)

## 2012-03-29 LAB — HEPATIC FUNCTION PANEL
Bilirubin, Direct: 0 mg/dL (ref 0.0–0.3)
Total Bilirubin: 0.4 mg/dL (ref 0.3–1.2)

## 2012-03-29 LAB — BASIC METABOLIC PANEL
Calcium: 8.5 mg/dL (ref 8.4–10.5)
Creatinine, Ser: 0.9 mg/dL (ref 0.4–1.2)

## 2012-04-19 ENCOUNTER — Other Ambulatory Visit: Payer: Self-pay | Admitting: Internal Medicine

## 2012-05-04 ENCOUNTER — Ambulatory Visit: Payer: Self-pay | Admitting: Obstetrics and Gynecology

## 2012-05-27 ENCOUNTER — Encounter: Payer: Self-pay | Admitting: Internal Medicine

## 2012-05-27 ENCOUNTER — Ambulatory Visit (INDEPENDENT_AMBULATORY_CARE_PROVIDER_SITE_OTHER): Payer: Medicare Other | Admitting: Internal Medicine

## 2012-05-27 VITALS — BP 130/90 | Temp 98.1°F | Wt 222.0 lb

## 2012-05-27 DIAGNOSIS — M130 Polyarthritis, unspecified: Secondary | ICD-10-CM

## 2012-05-27 DIAGNOSIS — G8929 Other chronic pain: Secondary | ICD-10-CM

## 2012-05-27 MED ORDER — METHYLPREDNISOLONE ACETATE 80 MG/ML IJ SUSP
80.0000 mg | Freq: Once | INTRAMUSCULAR | Status: AC
  Administered 2012-05-27: 80 mg via INTRAMUSCULAR

## 2012-05-27 NOTE — Addendum Note (Signed)
Addended by: Kern Reap B on: 05/27/2012 04:01 PM   Modules accepted: Orders

## 2012-05-27 NOTE — Progress Notes (Signed)
  Subjective:    [name], female    DOB: June 27, 1966, 46 y.o.   MRN: [id number]  HPI  46 year old patient who has a history of rheumatologic disorder with poly arthritis. She is followed by rheumatology in is on immunosuppressive therapy including Imuran methotrexate and 10 mg of prednisone daily. She's also on chronic narcotics which includes fentanyl and Percocet. She has been plagued by intermittent acute pain crises. Yesterday she had the onset of left ankle and foot pain bilateral knee pain right hip and right wrist pain as well as pain of all the fingers of the right hand. Pain has been refractory in spite of her topical and oral narcotics. No fever or other systemic complaints    Review of Systems  Neurological: Positive for weakness.       Objective:   Physical Exam  Constitutional:       Overweight Appears unwell but in no acute distress Afebrile  Musculoskeletal:       No obvious joint effusion Complains of stiffness pain and decreased range of motion of the right wrist and fingers but no active synovitis          Assessment & Plan:   Rheumatologic disorder with polyarthritis. We'll treat with Depo-Medrol 80. We'll continue her narcotics for pain control. We'll follow with rheumatology next week. Her last pain crisis was 2-3 months ago and lasted 3 days. Also this pain crises will also be short-term.

## 2012-05-27 NOTE — Patient Instructions (Signed)
Rheumatology followup as discussed  Call or return to clinic prn if these symptoms worsen or fail to improve as anticipated.

## 2012-06-14 ENCOUNTER — Other Ambulatory Visit: Payer: Self-pay | Admitting: Internal Medicine

## 2012-06-29 ENCOUNTER — Telehealth: Payer: Self-pay | Admitting: Internal Medicine

## 2012-06-29 MED ORDER — PREDNISONE 10 MG PO TABS: 10.0000 mg | ORAL_TABLET | Freq: Two times a day (BID) | ORAL | Status: DC

## 2012-06-29 NOTE — Telephone Encounter (Signed)
Done. Pt aware 

## 2012-06-29 NOTE — Telephone Encounter (Signed)
Please advise 

## 2012-06-29 NOTE — Telephone Encounter (Signed)
Prednisone 10 mg  #50

## 2012-06-29 NOTE — Telephone Encounter (Signed)
Caller: [name] [name]/Patient; Phone Number: [phone]; Message from caller:   Pt stating she sees Dr.  Micheline Maze Rheumatologist and he recommended to increase Prednisone from 10 mg to 20 mg qd on 06/30/2012 until she returns from vacation on ~ 07/11/2012.  Pt has connective disorder and polyarthritis.  Pt is requesting rx for either the 20 mg or 10 mg tablets from Dr Amador Cunas .  RN questioned if she had called Dr.  Micheline Maze about rx request , and she stated hard to get through to office, and mentioned that Dr.  Amador Cunas has prescribed  in the past.  Pt can be called back at [phone]

## 2012-07-15 ENCOUNTER — Emergency Department: Payer: Self-pay | Admitting: Emergency Medicine

## 2012-07-15 LAB — COMPREHENSIVE METABOLIC PANEL
Alkaline Phosphatase: 99 U/L (ref 50–136)
Calcium, Total: 8.4 mg/dL — ABNORMAL LOW (ref 8.5–10.1)
Co2: 25 mmol/L (ref 21–32)
Creatinine: 0.91 mg/dL (ref 0.60–1.30)
EGFR (Non-African Amer.): >60
Glucose: 124 mg/dL — ABNORMAL HIGH (ref 65–99)
Osmolality: 286 (ref 275–301)
SGOT(AST): 20 U/L (ref 15–37)
SGPT (ALT): 24 U/L (ref 12–78)
Sodium: 141 mmol/L (ref 136–145)

## 2012-07-15 LAB — CBC
HGB: 11.8 g/dL — ABNORMAL LOW (ref 12.0–16.0)
MCH: 28.3 pg (ref 26.0–34.0)
MCHC: 32.2 g/dL (ref 32.0–36.0)
MCV: 88 fL (ref 80–100)
RBC: 4.19 10*6/uL (ref 3.80–5.20)
RDW: 18.7 % — ABNORMAL HIGH (ref 11.5–14.5)

## 2012-07-16 ENCOUNTER — Emergency Department: Payer: Self-pay | Admitting: Emergency Medicine

## 2012-07-16 LAB — TROPONIN I
Troponin-I: <0.02 ng/mL
Troponin-I: <0.02 ng/mL

## 2012-07-16 LAB — CK TOTAL AND CKMB (NOT AT ARMC)
CK, Total: 148 U/L (ref 21–215)
CK-MB: 3.2 ng/mL (ref 0.5–3.6)

## 2012-07-18 ENCOUNTER — Telehealth: Payer: Self-pay | Admitting: Internal Medicine

## 2012-07-18 NOTE — Telephone Encounter (Signed)
Over booked for today - per saundrea ok to schedule Tuesday 915 and block 11

## 2012-07-18 NOTE — Telephone Encounter (Signed)
Called pt and schd her for ov on Tues 07/19/12 at 9:15am and blocked the 11am slot as noted.

## 2012-07-18 NOTE — Telephone Encounter (Signed)
[facility] Triage Call Report Triage Record Num: [id number] Operator: Ether Griffins Patient Name: [name] [name] Call Date & Time: 07/16/2012 7:15:15PM Patient Phone: [phone] PCP: Gordy Savers Patient Gender: Female PCP Fax : [phone] Patient DOB: March 12, 1966 [facility] Name[facility] Reason for Call: Caller: [name] [name]/Patient; PCP: Eleonore Chiquito; CB#: [phone]; Calling about sharp chest pain getting worse--hurts to breathe in and to talk . Went to ED yesterday and just discharged this am--dx with pleurisy. Got Toradol shot and a script for Prednisone in ED. Taking Percocet & Lorazepam without effect. Advised to go back to ED. Protocol(s) Used: Chest Pain Recommended Outcome per Protocol: See Provider within 4 hours Override Outcome if Used in Protocol: See ED Immediately RN Reason for Override Outcome: Nursing Judgement Used. Reason for Outcome: Moderate to severe pain occurring with deep breath or a productive cough for one full day or more Care Advice: ~

## 2012-07-18 NOTE — Telephone Encounter (Signed)
Pt called and had gone back to ED Saturday. Pt rcvd a Toradol shot and Dekadron shot re: pain in lungs. Pt was told told sch ov with Dr Amador Cunas today. Pls advise.

## 2012-07-19 ENCOUNTER — Encounter: Payer: Self-pay | Admitting: Internal Medicine

## 2012-07-19 ENCOUNTER — Ambulatory Visit (INDEPENDENT_AMBULATORY_CARE_PROVIDER_SITE_OTHER): Payer: Medicare Other | Admitting: Internal Medicine

## 2012-07-19 VITALS — BP 130/80 | Temp 97.9°F | Wt 221.0 lb

## 2012-07-19 DIAGNOSIS — G8929 Other chronic pain: Secondary | ICD-10-CM

## 2012-07-19 DIAGNOSIS — M549 Dorsalgia, unspecified: Secondary | ICD-10-CM

## 2012-07-19 NOTE — Patient Instructions (Addendum)
Followup rheumatology  Call or return to clinic prn if these symptoms worsen or fail to improve as anticipated. Pleurisy Pleurisy is an inflammation and swelling of the lining of the lungs. It usually is the result of an underlying infection or other disease. Because of this inflammation, it hurts to breathe. It is aggravated by coughing or deep breathing. The primary goal in treating pleurisy is to diagnose and treat the condition that caused it.   HOME CARE INSTRUCTIONS    Only take over-the-counter or prescription medicines for pain, discomfort, or fever as directed by your caregiver.   If medications which kill germs (antibiotics) were prescribed, take the entire course. Even if you are feeling better, you need to take them.   Use a cool mist vaporizer to help loosen secretions. This is so the secretions can be coughed up more easily.  SEEK MEDICAL CARE IF:    Your pain is not controlled with medication or is increasing.   You have an increase inpus like (purulent) secretions brought up with coughing.  SEEK IMMEDIATE MEDICAL CARE IF:    You have blue or dark lips, fingernails, or toenails.   You begin coughing up blood.   You have increased difficulty breathing.   You have continuing pain unrelieved by medicine or lasting more than 1 week.   You have pain that radiates into your neck, arms, or jaw.   You develop increased shortness of breath or wheezing.   You develop a fever, rash, vomiting, fainting, or other serious complaints.  Document Released: 11/16/2005 Document Revised: 11/05/2011 Document Reviewed: 06/17/2007 [facility] Patient Information 2012 [place], [place].

## 2012-07-19 NOTE — Progress Notes (Signed)
Subjective:    [name], female    DOB: 1966/03/24, 46 y.o.   MRN: [id number]  HPI  46 year old patient who has a history of chronic pain and autoimmune disorder. She was seen at [facility] ED twice over this past weekend the 2 left-sided pleurisy. She's also been in contact with her rheumatologist and is on a tapering dose of prednisone. Medical regimen also includes analgesics as well as Voltaren. She states she had a similar episode of pleurisy in January that lasted one month. Pain is sharp beginning in the left anterior chest with radiation to the left scapular region. Pain is aggravated by movement and deep inspiration. Denies any leg pain or swelling.  Past Medical History  Diagnosis Date  . ANXIETY 06/06/2007  . BACK PAIN 02/22/2009  . DEPRESSION 06/06/2007  . FREQUENCY, URINARY 07/07/2007  . GERD 06/06/2007  . HYPOTHYROIDISM 06/06/2007  . INTERSTITIAL CYSTITIS 09/11/2010  . NEPHROLITHIASIS, HX OF 11/05/2010  . OSTEOPENIA 10/14/2009  . OVERACTIVE BLADDER 10/14/2009  . PAIN, CHRONIC NEC 06/06/2007  . POLYARTHRITIS 08/19/2007  . 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 WEIGHT GAIN 02/22/2009    History   Social History  . Marital Status: Divorced    Spouse Name: N/A    Number of Children: N/A  . Years of Education: N/A   Occupational History  . Not on file.   Social History Main Topics  . Smoking status: Never Smoker   . Smokeless tobacco: Never Used  . Alcohol Use: No  . Drug Use: No  . Sexually Active: Not on file   Other Topics Concern  . Not on file   Social History Narrative  . No narrative on file    Past Surgical History  Procedure Date  . Appendectomy   . Tonsillectomy   . Wrist reconstruction   . Revision total hip arthroplasty 06-2009    Family History  Problem Relation Age of Onset  . Hypertension Mother   . Arthritis Mother   . Asthma Sister   . Asthma Daughter     Allergies  Allergen Reactions  . Clarithromycin   .  Latex   . Penicillins   . Prasterone (Dhea)   . Promethazine Hcl     Current Outpatient Prescriptions on File Prior to Visit  Medication Sig Dispense Refill  . albuterol (PROVENTIL HFA) 108 (90 BASE) MCG/ACT inhaler Inhale 2 puffs into the lungs every 4 (four) hours as needed.        . ARIPiprazole (ABILIFY) 10 MG tablet Take 10 mg by mouth daily.        Marland Kitchen aspirin 81 MG tablet Take 81 mg by mouth daily.        Marland Kitchen azaTHIOprine (IMURAN) 50 MG tablet Take by mouth. 1 1/2 tabs qd      . Cholecalciferol (VITAMIN D) 2000 UNITS CAPS Take by mouth. Daily        . CYMBALTA 60 MG capsule TAKE ONE CAPSULE BY MOUTH TWICE DAILY  180 each  1  . diclofenac (VOLTAREN) 75 MG EC tablet Take 1 tablet (75 mg total) by mouth 2 (two) times daily.  180 tablet  6  . fentaNYL (DURAGESIC - DOSED MCG/HR) 50 MCG/HR Place 1 patch (50 mcg total) onto the skin every 3 (three) days.  10 patch  0  . ferrous gluconate (FERGON) 325 MG tablet Take 325 mg by mouth 2 (two) times daily.        Marland Kitchen L-Methylfolate-B6-B12 2.8-25-2 MG TABS  Take by mouth. Daily        . levothyroxine (SYNTHROID, LEVOTHROID) 25 MCG tablet TAKE ONE TABLET BY MOUTH ONE TIME DAILY  90 tablet  3  . LORazepam (ATIVAN) 1 MG tablet Take by mouth. 1 in the AM and 2 1/2 at night      . methocarbamol (ROBAXIN) 500 MG tablet Take 500 mg by mouth 3 (three) times daily.        Marland Kitchen OLANZapine (ZYPREXA) 2.5 MG tablet Take 2.5 mg by mouth every morning.        Marland Kitchen oxyCODONE-acetaminophen (PERCOCET) 10-650 MG per tablet Take 1 tablet by mouth every 6 (six) hours as needed.        . pantoprazole (PROTONIX) 40 MG tablet Take 1 tablet (40 mg total) by mouth 2 (two) times daily.  180 tablet  5  . predniSONE (DELTASONE) 10 MG tablet Take 1 tablet (10 mg total) by mouth 2 (two) times daily.  60 tablet  0  . solifenacin (VESICARE) 10 MG tablet Take 0.5 tablets (5 mg total) by mouth daily.  90 tablet  6  . Teriparatide, Recombinant, (FORTEO) 600 MCG/2.4ML SOLN Inject 20 mcg into  the skin every morning.        . TOPAMAX 100 MG tablet TAKE ONE TABLET BY MOUTH TWICE DAILY  60 each  5  . topiramate (TOPAMAX) 100 MG tablet TAKE ONE TABLET BY MOUTH TWICE DAILY  180 tablet  1  . traMADol (ULTRAM) 50 MG tablet Take 1 tablet (50 mg total) by mouth every 6 (six) hours as needed.  90 tablet  6    BP 130/80  Temp 97.9 F (36.6 C) (Oral)  Wt 221 lb (100.245 kg)       Review of Systems  Constitutional: Negative.   HENT: Negative for hearing loss, congestion, sore throat, rhinorrhea, dental problem, sinus pressure and tinnitus.   Eyes: Negative for pain, discharge and visual disturbance.  Respiratory: Negative for cough and shortness of breath.   Cardiovascular: Positive for chest pain. Negative for palpitations and leg swelling.  Gastrointestinal: Negative for nausea, vomiting, abdominal pain, diarrhea, constipation, blood in stool and abdominal distention.  Genitourinary: Negative for dysuria, urgency, frequency, hematuria, flank pain, vaginal bleeding, vaginal discharge, difficulty urinating, vaginal pain and pelvic pain.  Musculoskeletal: Negative for joint swelling, arthralgias and gait problem.  Skin: Negative for rash.  Neurological: Negative for dizziness, syncope, speech difficulty, weakness, numbness and headaches.  Hematological: Negative for adenopathy.  Psychiatric/Behavioral: Negative for behavioral problems, dysphoric mood and agitation. The patient is not nervous/anxious.        Objective:   Physical Exam  Constitutional: She is oriented to person, place, and time. She appears well-developed and well-nourished.  HENT:  Head: Normocephalic.  Right Ear: External ear normal.  Left Ear: External ear normal.  Mouth/Throat: Oropharynx is clear and moist.  Eyes: Conjunctivae and EOM are normal. Pupils are equal, round, and reactive to light.  Neck: Normal range of motion. Neck supple. No thyromegaly present.  Cardiovascular: Normal rate, regular rhythm,  normal heart sounds and intact distal pulses.   Pulmonary/Chest: Effort normal and breath sounds normal. No respiratory distress. She has no wheezes. She has no rales.       No rub O2 saturation 98% Pulse 71  Abdominal: Soft. Bowel sounds are normal. She exhibits no mass. There is no tenderness.  Musculoskeletal: Normal range of motion. She exhibits no edema and no tenderness.       No calf tenderness  Lymphadenopathy:    She has no cervical adenopathy.  Neurological: She is alert and oriented to person, place, and time.  Skin: Skin is warm and dry. No rash noted.  Psychiatric: She has a normal mood and affect. Her behavior is normal.          Assessment & Plan:   Pleurisy. We'll continue anti-inflammatories as well as analgesics. She will report any clinical worsening Hypothyroidism Anxiety disorder Chronic pain Autoimmune disorder  Followup rheumatology

## 2012-08-15 ENCOUNTER — Other Ambulatory Visit: Payer: Self-pay | Admitting: Internal Medicine

## 2012-08-19 ENCOUNTER — Ambulatory Visit (INDEPENDENT_AMBULATORY_CARE_PROVIDER_SITE_OTHER): Payer: Medicare Other

## 2012-08-19 DIAGNOSIS — Z23 Encounter for immunization: Secondary | ICD-10-CM

## 2012-08-25 ENCOUNTER — Ambulatory Visit (INDEPENDENT_AMBULATORY_CARE_PROVIDER_SITE_OTHER): Payer: Medicare Other | Admitting: Internal Medicine

## 2012-08-25 ENCOUNTER — Encounter: Payer: Self-pay | Admitting: Internal Medicine

## 2012-08-25 VITALS — BP 118/70 | Temp 98.0°F | Wt 222.0 lb

## 2012-08-25 DIAGNOSIS — M130 Polyarthritis, unspecified: Secondary | ICD-10-CM

## 2012-08-25 DIAGNOSIS — R42 Dizziness and giddiness: Secondary | ICD-10-CM

## 2012-08-25 DIAGNOSIS — G8929 Other chronic pain: Secondary | ICD-10-CM

## 2012-08-25 NOTE — Patient Instructions (Signed)
Call or return to clinic prn if these symptoms worsen or fail to improve as anticipated.

## 2012-08-25 NOTE — Progress Notes (Signed)
Subjective:    [name], female    DOB: November 18, 1966, 46 y.o.   MRN: [id number]  HPI  46 year old patient who is seen today for followup. She presents with a chief complaint of dizziness for about 1-1/2 weeks. She describes a sense of being lightheaded associated periods of mild diaphoresis. She also describes hot sweats. She was evaluated by gynecology 3 months ago and apparently [facility] and [facility] were normal. She presently is on 30 mg of prednisone do to an episode of lower extremity vasculitis. She is on multiple other medications. Episodes do not sound like true vertigo.  Past Medical History  Diagnosis Date  . ANXIETY 06/06/2007  . BACK PAIN 02/22/2009  . DEPRESSION 06/06/2007  . FREQUENCY, URINARY 07/07/2007  . GERD 06/06/2007  . HYPOTHYROIDISM 06/06/2007  . INTERSTITIAL CYSTITIS 09/11/2010  . NEPHROLITHIASIS, HX OF 11/05/2010  . OSTEOPENIA 10/14/2009  . OVERACTIVE BLADDER 10/14/2009  . PAIN, CHRONIC NEC 06/06/2007  . POLYARTHRITIS 08/19/2007  . 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 WEIGHT GAIN 02/22/2009    History   Social History  . Marital Status: Divorced    Spouse Name: N/A    Number of Children: N/A  . Years of Education: N/A   Occupational History  . Not on file.   Social History Main Topics  . Smoking status: Never Smoker   . Smokeless tobacco: Never Used  . Alcohol Use: No  . Drug Use: No  . Sexually Active: Not on file   Other Topics Concern  . Not on file   Social History Narrative  . No narrative on file    Past Surgical History  Procedure Date  . Appendectomy   . Tonsillectomy   . Wrist reconstruction   . Revision total hip arthroplasty 06-2009    Family History  Problem Relation Age of Onset  . Hypertension Mother   . Arthritis Mother   . Asthma Sister   . Asthma Daughter     Allergies  Allergen Reactions  . Clarithromycin   . Latex   . Penicillins   . Prasterone (Dhea)   . Promethazine Hcl     Current Outpatient  Prescriptions on File Prior to Visit  Medication Sig Dispense Refill  . albuterol (PROVENTIL HFA) 108 (90 BASE) MCG/ACT inhaler Inhale 2 puffs into the lungs every 4 (four) hours as needed.        . ARIPiprazole (ABILIFY) 10 MG tablet Take 10 mg by mouth daily.        Marland Kitchen aspirin 81 MG tablet Take 81 mg by mouth daily.        Marland Kitchen azaTHIOprine (IMURAN) 50 MG tablet Take by mouth. 1 1/2 tabs qd      . Cholecalciferol (VITAMIN D) 2000 UNITS CAPS Take by mouth. Daily        . CYMBALTA 60 MG capsule TAKE ONE CAPSULE BY MOUTH TWICE DAILY  180 each  1  . diclofenac (VOLTAREN) 75 MG EC tablet Take 1 tablet (75 mg total) by mouth 2 (two) times daily.  180 tablet  6  . fentaNYL (DURAGESIC - DOSED MCG/HR) 50 MCG/HR Place 1 patch (50 mcg total) onto the skin every 3 (three) days.  10 patch  0  . ferrous gluconate (FERGON) 325 MG tablet Take 325 mg by mouth 2 (two) times daily.        Marland Kitchen L-Methylfolate-B6-B12 2.8-25-2 MG TABS Take by mouth. Daily        . levothyroxine (SYNTHROID, LEVOTHROID) 25 MCG  tablet TAKE ONE TABLET BY MOUTH ONE TIME DAILY  90 tablet  3  . LORazepam (ATIVAN) 1 MG tablet Take by mouth. 1 in the AM and 2 1/2 at night      . methocarbamol (ROBAXIN) 500 MG tablet Take 500 mg by mouth 3 (three) times daily.        Marland Kitchen OLANZapine (ZYPREXA) 2.5 MG tablet Take 2.5 mg by mouth every morning.        Marland Kitchen oxyCODONE-acetaminophen (PERCOCET) 10-650 MG per tablet Take 1 tablet by mouth every 6 (six) hours as needed.        . pantoprazole (PROTONIX) 40 MG tablet TAKE ONE TABLET BY MOUTH TWICE DAILY  180 tablet  4  . predniSONE (DELTASONE) 10 MG tablet Take 1 tablet (10 mg total) by mouth 2 (two) times daily.  60 tablet  0  . solifenacin (VESICARE) 10 MG tablet Take 0.5 tablets (5 mg total) by mouth daily.  90 tablet  6  . Teriparatide, Recombinant, (FORTEO) 600 MCG/2.4ML SOLN Inject 20 mcg into the skin every morning.        . TOPAMAX 100 MG tablet TAKE ONE TABLET BY MOUTH TWICE DAILY  60 each  5  .  topiramate (TOPAMAX) 100 MG tablet TAKE ONE TABLET BY MOUTH TWICE DAILY  180 tablet  1  . traMADol (ULTRAM) 50 MG tablet Take 1 tablet (50 mg total) by mouth every 6 (six) hours as needed.  90 tablet  6    BP 118/70  Temp 98 F (36.7 C) (Oral)  Wt 222 lb (100.699 kg)       Review of Systems  Constitutional: Positive for diaphoresis and fatigue.  Neurological: Positive for light-headedness.       Objective:   Physical Exam  Constitutional: She is oriented to person, place, and time. She appears well-developed and well-nourished.  HENT:  Head: Normocephalic.  Right Ear: External ear normal.  Left Ear: External ear normal.  Mouth/Throat: Oropharynx is clear and moist.  Eyes: Conjunctivae normal and EOM are normal. Pupils are equal, round, and reactive to light.  Neck: Normal range of motion. Neck supple. No thyromegaly present.  Cardiovascular: Normal rate, regular rhythm, normal heart sounds and intact distal pulses.   Pulmonary/Chest: Effort normal and breath sounds normal.  Abdominal: Soft. Bowel sounds are normal. She exhibits no mass. There is no tenderness.  Musculoskeletal: Normal range of motion.  Lymphadenopathy:    She has no cervical adenopathy.  Neurological: She is alert and oriented to person, place, and time.  Skin: Skin is warm and dry. No rash noted.       Resolving patchy areas of hyperpigmentation involving the lower extremities  Psychiatric: She has a normal mood and affect. Her behavior is normal.          Assessment & Plan:   Chronic pain syndrome Possible vasomotor symptoms related to estrogen insufficiency. Her periods are becoming much lighter and less frequent  Will observe at the present time Continue prednisone taper as planned We'll call if unimproved or any worsening symptoms

## 2012-08-31 ENCOUNTER — Other Ambulatory Visit (INDEPENDENT_AMBULATORY_CARE_PROVIDER_SITE_OTHER): Payer: Medicare Other

## 2012-08-31 DIAGNOSIS — I776 Arteritis, unspecified: Secondary | ICD-10-CM

## 2012-08-31 DIAGNOSIS — R7982 Elevated C-reactive protein (CRP): Secondary | ICD-10-CM

## 2012-08-31 DIAGNOSIS — M359 Systemic involvement of connective tissue, unspecified: Secondary | ICD-10-CM

## 2012-08-31 LAB — CBC WITH DIFFERENTIAL/PLATELET
Basophils Absolute: 0 10*3/uL (ref 0.0–0.1)
Basophils Relative: 0.3 % (ref 0.0–3.0)
Eosinophils Absolute: 0.2 10*3/uL (ref 0.0–0.7)
Lymphocytes Relative: 23.3 % (ref 12.0–46.0)
MCHC: 31.4 g/dL (ref 30.0–36.0)
Neutrophils Relative %: 67.4 % (ref 43.0–77.0)
Platelets: 219 10*3/uL (ref 150.0–400.0)
RBC: 4.15 Mil/uL (ref 3.87–5.11)

## 2012-08-31 LAB — SEDIMENTATION RATE: Sed Rate: 10 mm/hr (ref 0–22)

## 2012-08-31 LAB — CREATININE, SERUM: Creatinine, Ser: 0.9 mg/dL (ref 0.4–1.2)

## 2012-08-31 LAB — HEPATIC FUNCTION PANEL
Bilirubin, Direct: 0 mg/dL (ref 0.0–0.3)
Total Bilirubin: 0.3 mg/dL (ref 0.3–1.2)

## 2012-09-01 LAB — C3 AND C4: C4 Complement: 22 mg/dL (ref 10–40)

## 2012-09-19 ENCOUNTER — Encounter: Payer: Self-pay | Admitting: Internal Medicine

## 2012-09-19 ENCOUNTER — Ambulatory Visit (INDEPENDENT_AMBULATORY_CARE_PROVIDER_SITE_OTHER): Payer: Medicare Other | Admitting: Internal Medicine

## 2012-09-19 VITALS — BP 120/80 | Temp 98.2°F | Wt 226.0 lb

## 2012-09-19 DIAGNOSIS — G8929 Other chronic pain: Secondary | ICD-10-CM

## 2012-09-19 DIAGNOSIS — M130 Polyarthritis, unspecified: Secondary | ICD-10-CM

## 2012-09-19 MED ORDER — METHYLPREDNISOLONE ACETATE 80 MG/ML IJ SUSP
80.0000 mg | Freq: Once | INTRAMUSCULAR | Status: AC
  Administered 2012-09-19: 80 mg via INTRAMUSCULAR

## 2012-09-19 NOTE — Progress Notes (Signed)
Subjective:    [name], female    DOB: [birth year], 46 y.o.   MRN: [id number]  HPI  46 year old patient who has a history of polyarthritis and chronic pain syndrome. She is followed by rheumatology and has been on immunosuppressants as well as chronic pain medications. She states that she has had a pain crisis since Thursday of last week. Pain is maximal in the right hip she's also having left knee and shoulder pain. She states she had a difficult time at church yesterday due to the pain in the hip is aggravated by movement. No constitutional complaints. She states that she slept through most the weekend taking oxycodone every 4-6 hours in addition to her usual medications. She is scheduled to see rheumatology next week. She did have screening lab performed earlier this month.  Past Medical History  Diagnosis Date  . ANXIETY 06/06/2007  . BACK PAIN 02/22/2009  . DEPRESSION 06/06/2007  . FREQUENCY, URINARY 07/07/2007  . GERD 06/06/2007  . HYPOTHYROIDISM 06/06/2007  . INTERSTITIAL CYSTITIS 09/11/2010  . NEPHROLITHIASIS, HX OF 11/05/2010  . OSTEOPENIA 10/14/2009  . OVERACTIVE BLADDER 10/14/2009  . PAIN, CHRONIC NEC 06/06/2007  . POLYARTHRITIS 08/19/2007  . 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 WEIGHT GAIN 02/22/2009    History   Social History  . Marital Status: Divorced    Spouse Name: N/A    Number of Children: N/A  . Years of Education: N/A   Occupational History  . Not on file.   Social History Main Topics  . Smoking status: Never Smoker   . Smokeless tobacco: Never Used  . Alcohol Use: No  . Drug Use: No  . Sexually Active: Not on file   Other Topics Concern  . Not on file   Social History Narrative  . No narrative on file    Past Surgical History  Procedure Date  . Appendectomy   . Tonsillectomy   . Wrist reconstruction   . Revision total hip arthroplasty 06-2009    Family History  Problem Relation Age of Onset  . Hypertension Mother   .  Arthritis Mother   . Asthma Sister   . Asthma Daughter     Allergies  Allergen Reactions  . Clarithromycin   . Latex   . Penicillins   . Prasterone (Dhea)   . Promethazine Hcl     Current Outpatient Prescriptions on File Prior to Visit  Medication Sig Dispense Refill  . albuterol (PROVENTIL HFA) 108 (90 BASE) MCG/ACT inhaler Inhale 2 puffs into the lungs every 4 (four) hours as needed.        . ARIPiprazole (ABILIFY) 10 MG tablet Take 10 mg by mouth daily.        Marland Kitchen aspirin 81 MG tablet Take 81 mg by mouth daily.        Marland Kitchen azaTHIOprine (IMURAN) 50 MG tablet Take by mouth. 1 1/2 tabs qd      . Cholecalciferol (VITAMIN D) 2000 UNITS CAPS Take by mouth. Daily        . CYMBALTA 60 MG capsule TAKE ONE CAPSULE BY MOUTH TWICE DAILY  180 each  1  . diclofenac (VOLTAREN) 75 MG EC tablet Take 1 tablet (75 mg total) by mouth 2 (two) times daily.  180 tablet  6  . fentaNYL (DURAGESIC - DOSED MCG/HR) 50 MCG/HR Place 1 patch (50 mcg total) onto the skin every 3 (three) days.  10 patch  0  . ferrous gluconate (FERGON) 325 MG tablet Take  325 mg by mouth 2 (two) times daily.        Marland Kitchen L-Methylfolate-B6-B12 2.8-25-2 MG TABS Take by mouth. Daily        . levothyroxine (SYNTHROID, LEVOTHROID) 25 MCG tablet TAKE ONE TABLET BY MOUTH ONE TIME DAILY  90 tablet  3  . LORazepam (ATIVAN) 1 MG tablet Take by mouth. 1 in the AM and 2 1/2 at night      . methocarbamol (ROBAXIN) 500 MG tablet Take 500 mg by mouth 3 (three) times daily.        Marland Kitchen OLANZapine (ZYPREXA) 2.5 MG tablet Take 2.5 mg by mouth every morning.        Marland Kitchen oxyCODONE-acetaminophen (PERCOCET) 10-650 MG per tablet Take 1 tablet by mouth every 6 (six) hours as needed.        . pantoprazole (PROTONIX) 40 MG tablet TAKE ONE TABLET BY MOUTH TWICE DAILY  180 tablet  4  . predniSONE (DELTASONE) 10 MG tablet Take 1 tablet (10 mg total) by mouth 2 (two) times daily.  60 tablet  0  . solifenacin (VESICARE) 10 MG tablet Take 0.5 tablets (5 mg total) by mouth  daily.  90 tablet  6  . Teriparatide, Recombinant, (FORTEO) 600 MCG/2.4ML SOLN Inject 20 mcg into the skin every morning.        . TOPAMAX 100 MG tablet TAKE ONE TABLET BY MOUTH TWICE DAILY  60 each  5  . topiramate (TOPAMAX) 100 MG tablet TAKE ONE TABLET BY MOUTH TWICE DAILY  180 tablet  1  . traMADol (ULTRAM) 50 MG tablet Take 1 tablet (50 mg total) by mouth every 6 (six) hours as needed.  90 tablet  6    BP 120/80  Temp 98.2 F (36.8 C) (Oral)  Wt 226 lb (102.513 kg)       Review of Systems  Constitutional: Negative.   HENT: Negative for hearing loss, congestion, sore throat, rhinorrhea, dental problem, sinus pressure and tinnitus.   Eyes: Negative for pain, discharge and visual disturbance.  Respiratory: Negative for cough and shortness of breath.   Cardiovascular: Negative for chest pain, palpitations and leg swelling.  Gastrointestinal: Negative for nausea, vomiting, abdominal pain, diarrhea, constipation, blood in stool and abdominal distention.  Genitourinary: Negative for dysuria, urgency, frequency, hematuria, flank pain, vaginal bleeding, vaginal discharge, difficulty urinating, vaginal pain and pelvic pain.  Musculoskeletal: Positive for arthralgias. Negative for joint swelling and gait problem.  Skin: Negative for rash.  Neurological: Negative for dizziness, syncope, speech difficulty, weakness, numbness and headaches.  Hematological: Negative for adenopathy.  Psychiatric/Behavioral: Negative for behavioral problems, dysphoric mood and agitation. The patient is not nervous/anxious.        Objective:   Physical Exam  Constitutional: She is oriented to person, place, and time. She appears well-developed and well-nourished.       Appears uncomfortable but in no acute distress. Blood pressure normal afebrile  HENT:  Head: Normocephalic.  Right Ear: External ear normal.  Left Ear: External ear normal.  Mouth/Throat: Oropharynx is clear and moist.       No thrush    Eyes: Conjunctivae normal and EOM are normal. Pupils are equal, round, and reactive to light.  Neck: Normal range of motion. Neck supple. No thyromegaly present.  Cardiovascular: Normal rate, regular rhythm, normal heart sounds and intact distal pulses.   Pulmonary/Chest: Effort normal and breath sounds normal.  Abdominal: Soft. Bowel sounds are normal. She exhibits no mass. There is no tenderness.  Musculoskeletal: Normal range  of motion.       No signs of active arthritis  Lymphadenopathy:    She has no cervical adenopathy.  Neurological: She is alert and oriented to person, place, and time.  Skin: Skin is warm and dry. No rash noted.  Psychiatric: She has a normal mood and affect. Her behavior is normal.          Assessment & Plan:   Polyarthritis with chronic pain. Acute exacerbation. We'll continue her maintenance medication. We'll treat with Depo-Medrol which has been helpful in the past. She is on low dose prednisone 10 mg daily Followup rheumatology next week

## 2012-09-19 NOTE — Patient Instructions (Signed)
Followup rheumatology next week as scheduled

## 2012-09-22 ENCOUNTER — Ambulatory Visit (INDEPENDENT_AMBULATORY_CARE_PROVIDER_SITE_OTHER): Payer: Medicare Other | Admitting: Family Medicine

## 2012-09-22 ENCOUNTER — Encounter: Payer: Self-pay | Admitting: Family Medicine

## 2012-09-22 VITALS — BP 120/78 | HR 113 | Temp 98.2°F | Wt 232.0 lb

## 2012-09-22 DIAGNOSIS — G8929 Other chronic pain: Secondary | ICD-10-CM

## 2012-09-22 DIAGNOSIS — M130 Polyarthritis, unspecified: Secondary | ICD-10-CM

## 2012-09-22 DIAGNOSIS — F411 Generalized anxiety disorder: Secondary | ICD-10-CM

## 2012-09-22 MED ORDER — KETOROLAC TROMETHAMINE 60 MG/2ML IM SOLN
60.0000 mg | Freq: Once | INTRAMUSCULAR | Status: AC
  Administered 2012-09-22: 60 mg via INTRAMUSCULAR

## 2012-09-22 NOTE — Progress Notes (Signed)
Chief Complaint  Patient presents with  . left ankle swelling    painful and sore per patient; rheumatologist increased prednisone     HPI:  L leg pain: -started yesterday -reports has polyarthritis, has rheumatologist -reports had pain crisis in R leg last week and and saw PCP and tx with decadron -called her rheumatologist and they increased her prednisone yesterday form 10mg  to 20mg  -pain is in joints in Left toes and L ankle -pain has not improved much since yesterday -reports has swelling with her pain crisis in the past -has tried percocet and skelaxin and elevation, but not helping, in the past has gotten toradol shot with crisis and this has helped. Has appointment with rheum in a few days. -Denies: fevers, chills, malaise, vomiting, rash, SOB, other pain sites  ROS: See pertinent positives and negatives per HPI.  Past Medical History  Diagnosis Date  . ANXIETY 06/06/2007  . BACK PAIN 02/22/2009  . DEPRESSION 06/06/2007  . FREQUENCY, URINARY 07/07/2007  . GERD 06/06/2007  . HYPOTHYROIDISM 06/06/2007  . INTERSTITIAL CYSTITIS 09/11/2010  . NEPHROLITHIASIS, HX OF 11/05/2010  . OSTEOPENIA 10/14/2009  . OVERACTIVE BLADDER 10/14/2009  . PAIN, CHRONIC NEC 06/06/2007  . POLYARTHRITIS 08/19/2007  . 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 WEIGHT GAIN 02/22/2009    Family History  Problem Relation Age of Onset  . Hypertension Mother   . Arthritis Mother   . Asthma Sister   . Asthma Daughter     History   Social History  . Marital Status: Divorced    Spouse Name: N/A    Number of Children: N/A  . Years of Education: N/A   Social History Main Topics  . Smoking status: Never Smoker   . Smokeless tobacco: Never Used  . Alcohol Use: No  . Drug Use: No  . Sexually Active: None   Other Topics Concern  . None   Social History Narrative  . None    Current outpatient prescriptions:albuterol (PROVENTIL HFA) 108 (90 BASE) MCG/ACT inhaler, Inhale 2 puffs into the lungs every 4  (four) hours as needed.  , Disp: , Rfl: ;  ARIPiprazole (ABILIFY) 10 MG tablet, Take 10 mg by mouth daily.  , Disp: , Rfl: ;  aspirin 81 MG tablet, Take 81 mg by mouth daily.  , Disp: , Rfl: ;  azaTHIOprine (IMURAN) 50 MG tablet, Take by mouth. 1 1/2 tabs qd, Disp: , Rfl:  Cholecalciferol (VITAMIN D) 2000 UNITS CAPS, Take by mouth. Daily  , Disp: , Rfl: ;  CYMBALTA 60 MG capsule, TAKE ONE CAPSULE BY MOUTH TWICE DAILY, Disp: 180 each, Rfl: 1;  diclofenac (VOLTAREN) 75 MG EC tablet, Take 1 tablet (75 mg total) by mouth 2 (two) times daily., Disp: 180 tablet, Rfl: 6;  fentaNYL (DURAGESIC - DOSED MCG/HR) 50 MCG/HR, Place 1 patch (50 mcg total) onto the skin every 3 (three) days., Disp: 10 patch, Rfl: 0 ferrous gluconate (FERGON) 325 MG tablet, Take 325 mg by mouth 2 (two) times daily.  , Disp: , Rfl: ;  L-Methylfolate-B6-B12 2.8-25-2 MG TABS, Take by mouth. Daily  , Disp: , Rfl: ;  levothyroxine (SYNTHROID, LEVOTHROID) 25 MCG tablet, TAKE ONE TABLET BY MOUTH ONE TIME DAILY, Disp: 90 tablet, Rfl: 3;  LORazepam (ATIVAN) 1 MG tablet, Take by mouth. 1 in the AM and 2 1/2 at night, Disp: , Rfl:  methocarbamol (ROBAXIN) 500 MG tablet, Take 500 mg by mouth 3 (three) times daily.  , Disp: , Rfl: ;  OLANZapine (ZYPREXA)  2.5 MG tablet, Take 2.5 mg by mouth every morning.  , Disp: , Rfl: ;  oxyCODONE-acetaminophen (PERCOCET) 10-650 MG per tablet, Take 1 tablet by mouth every 6 (six) hours as needed.  , Disp: , Rfl: ;  pantoprazole (PROTONIX) 40 MG tablet, TAKE ONE TABLET BY MOUTH TWICE DAILY, Disp: 180 tablet, Rfl: 4 predniSONE (DELTASONE) 10 MG tablet, Take 1 tablet (10 mg total) by mouth 2 (two) times daily., Disp: 60 tablet, Rfl: 0;  solifenacin (VESICARE) 10 MG tablet, Take 0.5 tablets (5 mg total) by mouth daily., Disp: 90 tablet, Rfl: 6;  Teriparatide, Recombinant, (FORTEO) 600 MCG/2.4ML SOLN, Inject 20 mcg into the skin every morning.  , Disp: , Rfl: ;  TOPAMAX 100 MG tablet, TAKE ONE TABLET BY MOUTH TWICE DAILY, Disp:  60 each, Rfl: 5 topiramate (TOPAMAX) 100 MG tablet, TAKE ONE TABLET BY MOUTH TWICE DAILY, Disp: 180 tablet, Rfl: 1;  traMADol (ULTRAM) 50 MG tablet, Take 1 tablet (50 mg total) by mouth every 6 (six) hours as needed., Disp: 90 tablet, Rfl: 6;  valACYclovir (VALTREX) 500 MG tablet, , Disp: , Rfl:   EXAM:  Filed Vitals:   09/22/12 0944  BP: 120/78  Pulse: 113  Temp: 98.2 F (36.8 C)    There is no height on file to calculate BMI.  GENERAL: vitals reviewed and listed above, alert, oriented, appears well hydrated and in no acute distress  HEENT: atraumatic, conjunttiva clear, no obvious abnormalities on inspection of external nose and ears  NECK: no obvious masses on inspection  LUNGS: clear to auscultation bilaterally, no wheezes, rales or rhonchi, good air movement  CV: HRRR, no peripheral edema  MS: moves all extremities without noticeable abnormality -both LEs with tr edema, to me does not appear the a right is different for left -good pedal pulses bilaterally -difuse TTP throughout foot and ankle -no rash, erythema, rubor or swelling noted  PSYCH: pleasant and cooperative, no obvious depression or anxiety  ASSESSMENT AND PLAN:  Discussed the following assessment and plan:  1. ANXIETY   2. PAIN, CHRONIC NEC   3. POLYARTHRITIS    -advised her to contact her rheumatologist whom manages this condition for her regarding steroid dose Toradol inj today as pt reports this has helped with pain crisis in the past after discussion risks/benefits -follow up with PCP and/or rheum as scheduled or if worsens or does not improve   Patient Instructions  -call your rheumatologist today to ask about your steroid dose and let them know you are not feeling better  -follow up with your doctor or your rheumatologist as scheduled or if worsening     Deanndra Kirley R.

## 2012-09-22 NOTE — Addendum Note (Signed)
Addended by: Azucena Freed on: 09/22/2012 10:33 AM   Modules accepted: Orders

## 2012-09-22 NOTE — Patient Instructions (Addendum)
-  call your rheumatologist today to ask about your steroid dose and let them know you are not feeling better  -follow up with your doctor or your rheumatologist as scheduled or if worsening

## 2012-09-23 ENCOUNTER — Emergency Department: Payer: Self-pay | Admitting: Emergency Medicine

## 2012-09-23 LAB — URINALYSIS, COMPLETE
Bilirubin,UR: NEGATIVE
Ketone: NEGATIVE
Nitrite: NEGATIVE
Ph: 7 (ref 4.5–8.0)
Protein: 30
RBC,UR: 5193 /HPF (ref 0–5)
Specific Gravity: 1.015 (ref 1.003–1.030)
Squamous Epithelial: 10

## 2012-09-23 LAB — CBC WITH DIFFERENTIAL/PLATELET
Basophil #: 0.1 10*3/uL (ref 0.0–0.1)
Basophil %: 0.6 %
Eosinophil %: 2.5 %
HCT: 41 % (ref 35.0–47.0)
HGB: 13 g/dL (ref 12.0–16.0)
Lymphocyte %: 18 %
MCH: 28.6 pg (ref 26.0–34.0)
MCV: 90 fL (ref 80–100)
Monocyte %: 7.5 %
Neutrophil #: 7.1 10*3/uL — ABNORMAL HIGH (ref 1.4–6.5)
RBC: 4.55 10*6/uL (ref 3.80–5.20)
RDW: 19.9 % — ABNORMAL HIGH (ref 11.5–14.5)

## 2012-09-23 LAB — COMPREHENSIVE METABOLIC PANEL
Albumin: 4 g/dL (ref 3.4–5.0)
Anion Gap: 8 (ref 7–16)
Glucose: 87 mg/dL (ref 65–99)
Osmolality: 288 (ref 275–301)
Potassium: 4 mmol/L (ref 3.5–5.1)
SGOT(AST): 25 U/L (ref 15–37)
Sodium: 144 mmol/L (ref 136–145)
Total Protein: 7.6 g/dL (ref 6.4–8.2)

## 2012-09-23 LAB — URIC ACID: Uric Acid: 2.8 mg/dL (ref 2.6–6.0)

## 2012-09-23 LAB — SEDIMENTATION RATE: Erythrocyte Sed Rate: 4 mm/hr (ref 0–20)

## 2012-09-26 ENCOUNTER — Telehealth: Payer: Self-pay | Admitting: Internal Medicine

## 2012-09-26 DIAGNOSIS — S92909A Unspecified fracture of unspecified foot, initial encounter for closed fracture: Secondary | ICD-10-CM

## 2012-09-26 NOTE — Telephone Encounter (Signed)
She saw dr. Selena Batten last week while you were out - got pain shot - please advise on ortho and continued pain issues

## 2012-09-26 NOTE — Telephone Encounter (Signed)
Where were x-rays performed? Okay for orthopedic referral

## 2012-09-26 NOTE — Telephone Encounter (Signed)
Pt has 3  fractures in foot and needs to know where to go to get cast put on. Splint on now. Also continued pain.Please call.

## 2012-09-26 NOTE — Telephone Encounter (Signed)
Spoke with pt- seen at at [facility] - xray done there -  Will do order for ortho referral

## 2012-10-10 ENCOUNTER — Telehealth: Payer: Self-pay | Admitting: Internal Medicine

## 2012-10-10 DIAGNOSIS — IMO0002 Reserved for concepts with insufficient information to code with codable children: Secondary | ICD-10-CM

## 2012-10-10 DIAGNOSIS — Z79899 Other long term (current) drug therapy: Secondary | ICD-10-CM

## 2012-10-10 DIAGNOSIS — M359 Systemic involvement of connective tissue, unspecified: Secondary | ICD-10-CM

## 2012-10-10 NOTE — Telephone Encounter (Signed)
Orders placed.

## 2012-10-10 NOTE — Telephone Encounter (Signed)
Dr Kirtland Bouchard approved for pt to have labs done here. Pt going to [facility] for [facility] Density, would like labs done also. Per request from Dr Micheline Maze, pls order CBC, BMP, ALT, C3 C4, CRP.  Diagnosis V58.69, V58.65, 710.9..  Every 10 wks X 2.

## 2012-10-17 ENCOUNTER — Ambulatory Visit (INDEPENDENT_AMBULATORY_CARE_PROVIDER_SITE_OTHER)
Admission: RE | Admit: 2012-10-17 | Discharge: 2012-10-17 | Disposition: A | Discharge status: Home / Self Care | Payer: Medicare Other | Source: Ambulatory Visit

## 2012-10-17 ENCOUNTER — Other Ambulatory Visit: Payer: Self-pay | Admitting: Internal Medicine

## 2012-10-17 ENCOUNTER — Other Ambulatory Visit (INDEPENDENT_AMBULATORY_CARE_PROVIDER_SITE_OTHER): Payer: Medicare Other

## 2012-10-17 DIAGNOSIS — IMO0002 Reserved for concepts with insufficient information to code with codable children: Secondary | ICD-10-CM

## 2012-10-17 DIAGNOSIS — M359 Systemic involvement of connective tissue, unspecified: Secondary | ICD-10-CM

## 2012-10-17 DIAGNOSIS — M899 Disorder of bone, unspecified: Secondary | ICD-10-CM

## 2012-10-17 DIAGNOSIS — Z79899 Other long term (current) drug therapy: Secondary | ICD-10-CM

## 2012-10-17 DIAGNOSIS — M949 Disorder of cartilage, unspecified: Secondary | ICD-10-CM

## 2012-10-17 LAB — BASIC METABOLIC PANEL
CO2: 27 mEq/L (ref 19–32)
Chloride: 107 mEq/L (ref 96–112)
Creatinine, Ser: 0.8 mg/dL (ref 0.4–1.2)
Potassium: 3.5 mEq/L (ref 3.5–5.1)

## 2012-10-17 LAB — CBC WITH DIFFERENTIAL/PLATELET
Basophils Absolute: 0 10*3/uL (ref 0.0–0.1)
Basophils Relative: 0.1 % (ref 0.0–3.0)
Hemoglobin: 11.3 g/dL — ABNORMAL LOW (ref 12.0–15.0)
Lymphocytes Relative: 12.6 % (ref 12.0–46.0)
Monocytes Relative: 3 % (ref 3.0–12.0)
Neutro Abs: 11.3 10*3/uL — ABNORMAL HIGH (ref 1.4–7.7)
Neutrophils Relative %: 83 % — ABNORMAL HIGH (ref 43.0–77.0)
RBC: 3.96 Mil/uL (ref 3.87–5.11)
RDW: 19.4 % — ABNORMAL HIGH (ref 11.5–14.6)

## 2012-10-17 LAB — C-REACTIVE PROTEIN: CRP: 2.6 mg/dL (ref 0.5–20.0)

## 2012-11-04 ENCOUNTER — Telehealth: Payer: Self-pay | Admitting: Internal Medicine

## 2012-11-04 NOTE — Telephone Encounter (Signed)
Skin revealed osteopenia encouraged calcium and vitamin D supplementation and more exercise

## 2012-11-04 NOTE — Telephone Encounter (Signed)
Pt called req to get bone density results. Pls call.

## 2012-11-07 NOTE — Telephone Encounter (Signed)
Left message on voicemail.

## 2012-11-08 NOTE — Telephone Encounter (Signed)
Spoke to pt told her Dexa showed Osteopenia and need to take Calcium and Vit D supplement and more exercise per Dr. Amador Cunas. Pt verbalized understanding and stated already taking Ca and Vit D. Told pt okay.

## 2012-11-09 ENCOUNTER — Other Ambulatory Visit: Payer: Self-pay | Admitting: Internal Medicine

## 2012-12-05 ENCOUNTER — Telehealth: Payer: Self-pay | Admitting: Internal Medicine

## 2012-12-05 ENCOUNTER — Other Ambulatory Visit: Payer: Self-pay | Admitting: Orthopedic Surgery

## 2012-12-05 ENCOUNTER — Emergency Department: Payer: Self-pay | Admitting: Emergency Medicine

## 2012-12-05 DIAGNOSIS — R52 Pain, unspecified: Secondary | ICD-10-CM

## 2012-12-05 DIAGNOSIS — R609 Edema, unspecified: Secondary | ICD-10-CM

## 2012-12-05 NOTE — Telephone Encounter (Signed)
Patient states she is on the way to the ED at  [facility] because she lives in [place].  She saw her Orthopedic doctor today to follow up from a surgery she had in November. She is having pain in her calf today. He tried to schedule her for a Doppler today but they could not do it until tomorrow so that is why she is on the way to ED. Message sent to notify  Dr. Amador Cunas

## 2012-12-06 ENCOUNTER — Other Ambulatory Visit: Payer: Medicare Other

## 2012-12-16 ENCOUNTER — Encounter: Payer: Self-pay | Admitting: Internal Medicine

## 2012-12-16 ENCOUNTER — Ambulatory Visit (INDEPENDENT_AMBULATORY_CARE_PROVIDER_SITE_OTHER): Payer: Medicare Other | Admitting: Internal Medicine

## 2012-12-16 VITALS — BP 110/70 | HR 90 | Temp 97.9°F | Resp 18 | Wt 234.0 lb

## 2012-12-16 DIAGNOSIS — G8929 Other chronic pain: Secondary | ICD-10-CM

## 2012-12-16 DIAGNOSIS — M130 Polyarthritis, unspecified: Secondary | ICD-10-CM

## 2012-12-16 DIAGNOSIS — E039 Hypothyroidism, unspecified: Secondary | ICD-10-CM

## 2012-12-16 MED ORDER — TOPIRAMATE 100 MG PO TABS: 100.0000 mg | ORAL_TABLET | Freq: Two times a day (BID) | ORAL | Status: DC

## 2012-12-16 NOTE — Progress Notes (Signed)
Subjective:    [name], female    DOB: 12-14-1965, 47 y.o.   MRN: [id number]  HPI  47 year old patient who has a history of polyarthritis chronic pain and hypothyroidism. She has had recent laboratory studies done and was a bit concerned about her lipid profile. This was compared to a lipid profile in 2012.   In general she has done quite well. No cardiovascular risk factors  Past Medical History  Diagnosis Date  . ANXIETY 06/06/2007  . BACK PAIN 02/22/2009  . DEPRESSION 06/06/2007  . FREQUENCY, URINARY 07/07/2007  . GERD 06/06/2007  . HYPOTHYROIDISM 06/06/2007  . INTERSTITIAL CYSTITIS 09/11/2010  . NEPHROLITHIASIS, HX OF 11/05/2010  . OSTEOPENIA 10/14/2009  . OVERACTIVE BLADDER 10/14/2009  . PAIN, CHRONIC NEC 06/06/2007  . POLYARTHRITIS 08/19/2007  . 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 WEIGHT GAIN 02/22/2009    History   Social History  . Marital Status: Divorced    Spouse Name: N/A    Number of Children: N/A  . Years of Education: N/A   Occupational History  . Not on file.   Social History Main Topics  . Smoking status: Never Smoker   . Smokeless tobacco: Never Used  . Alcohol Use: No  . Drug Use: No  . Sexually Active: Not on file   Other Topics Concern  . Not on file   Social History Narrative  . No narrative on file    Past Surgical History  Procedure Date  . Appendectomy   . Tonsillectomy   . Wrist reconstruction   . Revision total hip arthroplasty 06-2009    Family History  Problem Relation Age of Onset  . Hypertension Mother   . Arthritis Mother   . Asthma Sister   . Asthma Daughter     Allergies  Allergen Reactions  . Clarithromycin   . Latex   . Penicillins   . Prasterone (Dhea)   . Promethazine Hcl     Current Outpatient Prescriptions on File Prior to Visit  Medication Sig Dispense Refill  . albuterol (PROVENTIL HFA) 108 (90 BASE) MCG/ACT inhaler Inhale 2 puffs into the lungs every 4 (four) hours as needed.          . ARIPiprazole (ABILIFY) 10 MG tablet Take 10 mg by mouth daily.        Marland Kitchen azaTHIOprine (IMURAN) 50 MG tablet Take by mouth. 1 1/2 tabs qd      . Cholecalciferol (VITAMIN D) 2000 UNITS CAPS Take by mouth. Daily        . CYMBALTA 60 MG capsule TAKE ONE CAPSULE BY MOUTH TWICE DAILY  60 capsule  0  . diclofenac (VOLTAREN) 75 MG EC tablet Take 1 tablet (75 mg total) by mouth 2 (two) times daily.  180 tablet  6  . fentaNYL (DURAGESIC - DOSED MCG/HR) 50 MCG/HR Place 1 patch (50 mcg total) onto the skin every 3 (three) days.  10 patch  0  . ferrous gluconate (FERGON) 325 MG tablet Take 325 mg by mouth 2 (two) times daily.        Marland Kitchen L-Methylfolate-B6-B12 2.8-25-2 MG TABS Take by mouth. Daily        . levothyroxine (SYNTHROID, LEVOTHROID) 25 MCG tablet TAKE ONE TABLET BY MOUTH ONE TIME DAILY  90 tablet  2  . LORazepam (ATIVAN) 1 MG tablet Take by mouth. 1 in the AM and 2 1/2 at night      . methocarbamol (ROBAXIN) 500 MG tablet Take 500 mg by  mouth 3 (three) times daily.        Marland Kitchen OLANZapine (ZYPREXA) 2.5 MG tablet Take 2.5 mg by mouth every morning.        . pantoprazole (PROTONIX) 40 MG tablet TAKE ONE TABLET BY MOUTH TWICE DAILY  180 tablet  4  . solifenacin (VESICARE) 10 MG tablet Take 0.5 tablets (5 mg total) by mouth daily.  90 tablet  6  . Teriparatide, Recombinant, (FORTEO) 600 MCG/2.4ML SOLN Inject 20 mcg into the skin every morning.        . topiramate (TOPAMAX) 100 MG tablet Take 1 tablet (100 mg total) by mouth 2 (two) times daily.  180 tablet  6  . traMADol (ULTRAM) 50 MG tablet Take 1 tablet (50 mg total) by mouth every 6 (six) hours as needed.  90 tablet  6  . valACYclovir (VALTREX) 500 MG tablet         BP 110/70  Pulse 90  Temp 97.9 F (36.6 C) (Oral)  Resp 18  Wt 234 lb (106.142 kg)  LMP 12/05/2012       Review of Systems  Constitutional: Negative.   HENT: Negative for hearing loss, congestion, sore throat, rhinorrhea, dental problem, sinus pressure and tinnitus.   Eyes:  Negative for pain, discharge and visual disturbance.  Respiratory: Negative for cough and shortness of breath.   Cardiovascular: Negative for chest pain, palpitations and leg swelling.  Gastrointestinal: Negative for nausea, vomiting, abdominal pain, diarrhea, constipation, blood in stool and abdominal distention.  Genitourinary: Negative for dysuria, urgency, frequency, hematuria, flank pain, vaginal bleeding, vaginal discharge, difficulty urinating, vaginal pain and pelvic pain.  Musculoskeletal: Positive for back pain, arthralgias and gait problem. Negative for joint swelling.  Skin: Negative for rash.  Neurological: Negative for dizziness, syncope, speech difficulty, weakness, numbness and headaches.  Hematological: Negative for adenopathy.  Psychiatric/Behavioral: Negative for behavioral problems, dysphoric mood and agitation. The patient is not nervous/anxious.        Objective:   Physical Exam  Constitutional: She appears well-developed and well-nourished. No distress.       Blood pressure low normal  Musculoskeletal:       Left foot in a boot          Assessment & Plan:   Polyarthritis Hypothyroidism Preventive health. Lipid profile reviewed patient has a high HDL cholesterol and total cholesterol and LDL only slightly elevated. This has worsened slightly since a lipid profile in 2012. Lifestyle issues discussed

## 2012-12-16 NOTE — Patient Instructions (Signed)
Limit your sodium (Salt) intake    It is important that you exercise regularly, at least 20 minutes 3 to 4 times per week.  If you develop chest pain or shortness of breath seek  medical attention.  You need to lose weight.  Consider a lower calorie diet and regular exercise.  Return in 6 months for follow-up   

## 2013-01-16 ENCOUNTER — Ambulatory Visit (INDEPENDENT_AMBULATORY_CARE_PROVIDER_SITE_OTHER)
Admission: RE | Admit: 2013-01-16 | Discharge: 2013-01-16 | Disposition: A | Discharge status: Home / Self Care | Payer: Medicare Other | Source: Ambulatory Visit | Attending: Family Medicine | Admitting: Family Medicine

## 2013-01-16 ENCOUNTER — Encounter: Payer: Self-pay | Admitting: Family Medicine

## 2013-01-16 ENCOUNTER — Ambulatory Visit (INDEPENDENT_AMBULATORY_CARE_PROVIDER_SITE_OTHER): Payer: Medicare Other | Admitting: Family Medicine

## 2013-01-16 DIAGNOSIS — T148XXA Other injury of unspecified body region, initial encounter: Secondary | ICD-10-CM

## 2013-01-16 DIAGNOSIS — M79609 Pain in unspecified limb: Secondary | ICD-10-CM

## 2013-01-16 NOTE — Patient Instructions (Addendum)
-  please apply ice to this area for 15 minutes twice daily  -can use your pain medications as prescribed  -get xrays today - we will contact you if they show any fracture  -please see your doctor if worsening or not improving over the next 2 weeks
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
## 2013-01-17 ENCOUNTER — Telehealth: Payer: Self-pay

## 2013-01-17 NOTE — Telephone Encounter (Signed)
Pt called requesting x-ray results from yesterday. Pls advise.

## 2013-01-17 NOTE — Telephone Encounter (Signed)
Called and spoke with pt and pt is aware.  

## 2013-01-17 NOTE — Telephone Encounter (Signed)
Per reports from radiologist all were normal - no fractures or dislocation.

## 2013-02-15 ENCOUNTER — Ambulatory Visit (INDEPENDENT_AMBULATORY_CARE_PROVIDER_SITE_OTHER): Payer: Medicare Other | Admitting: Internal Medicine

## 2013-02-15 ENCOUNTER — Encounter: Payer: Self-pay | Admitting: Internal Medicine

## 2013-02-15 VITALS — BP 128/70 | HR 72 | Temp 98.0°F | Wt 237.0 lb

## 2013-02-15 DIAGNOSIS — F329 Major depressive disorder, single episode, unspecified: Secondary | ICD-10-CM

## 2013-02-15 DIAGNOSIS — E039 Hypothyroidism, unspecified: Secondary | ICD-10-CM

## 2013-02-15 DIAGNOSIS — M130 Polyarthritis, unspecified: Secondary | ICD-10-CM

## 2013-02-15 DIAGNOSIS — R7309 Other abnormal glucose: Secondary | ICD-10-CM

## 2013-02-15 DIAGNOSIS — N390 Urinary tract infection, site not specified: Secondary | ICD-10-CM

## 2013-02-15 LAB — POCT URINALYSIS DIPSTICK
Blood, UA: NEGATIVE
Nitrite, UA: NEGATIVE
Spec Grav, UA: 1.015
Urobilinogen, UA: 0.2
pH, UA: 7.5

## 2013-02-15 MED ORDER — CEFUROXIME AXETIL 500 MG PO TABS: 500.0000 mg | ORAL_TABLET | Freq: Two times a day (BID) | ORAL | Status: AC

## 2013-02-15 NOTE — Progress Notes (Signed)
Subjective:    [name], female    DOB: 1966-02-02, 47 y.o.   MRN: [id number]  HPI  47 year old Hispanic female with history of connective tissue disease/polyarthritis and depression complains of 2 days of dysuria. She has associated urinary frequency and urgency. She notes sharp pain with urination. She denies fever. She denies back or flank pain.  She has been on Zyprexa and Abilify has gained approximately 80 pounds over last 2 years.  She is followed by rheumatologist in [place]. She is due for surveillance blood work.  She is allergic to penicillin (rash).  Review of Systems    negative for fever or chills.  Increased joint pains recently  Past Medical History  Diagnosis Date  . ANXIETY 06/06/2007  . BACK PAIN 02/22/2009  . DEPRESSION 06/06/2007  . FREQUENCY, URINARY 07/07/2007  . GERD 06/06/2007  . HYPOTHYROIDISM 06/06/2007  . INTERSTITIAL CYSTITIS 09/11/2010  . NEPHROLITHIASIS, HX OF 11/05/2010  . OSTEOPENIA 10/14/2009  . OVERACTIVE BLADDER 10/14/2009  . PAIN, CHRONIC NEC 06/06/2007  . POLYARTHRITIS 08/19/2007  . 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 WEIGHT GAIN 02/22/2009    History   Social History  . Marital Status: Divorced    Spouse Name: N/A    Number of Children: N/A  . Years of Education: N/A   Occupational History  . Not on file.   Social History Main Topics  . Smoking status: Never Smoker   . Smokeless tobacco: Never Used  . Alcohol Use: No  . Drug Use: No  . Sexually Active: Not on file   Other Topics Concern  . Not on file   Social History Narrative  . No narrative on file    Past Surgical History  Procedure Laterality Date  . Appendectomy    . Tonsillectomy    . Wrist reconstruction    . Revision total hip arthroplasty  06-2009    Family History  Problem Relation Age of Onset  . Hypertension Mother   . Arthritis Mother   . Asthma Sister   . Asthma Daughter     Allergies  Allergen Reactions  .  Clarithromycin   . Dhea (Nutritional Supplements)   . Latex   . Penicillins   . Prasterone (Dhea)   . Promethazine Hcl     Current Outpatient Prescriptions on File Prior to Visit  Medication Sig Dispense Refill  . albuterol (PROVENTIL HFA) 108 (90 BASE) MCG/ACT inhaler Inhale 2 puffs into the lungs every 4 (four) hours as needed.        . ARIPiprazole (ABILIFY) 10 MG tablet Take 10 mg by mouth daily.        Marland Kitchen aspirin 325 MG tablet Take 325 mg by mouth 2 (two) times daily.      Marland Kitchen azaTHIOprine (IMURAN) 50 MG tablet Take by mouth. 1 1/2 tabs qd      . Cholecalciferol (VITAMIN D) 2000 UNITS CAPS Take by mouth. Daily        . CYMBALTA 60 MG capsule TAKE ONE CAPSULE BY MOUTH TWICE DAILY  60 capsule  0  . diclofenac (VOLTAREN) 75 MG EC tablet Take 1 tablet (75 mg total) by mouth 2 (two) times daily.  180 tablet  6  . fentaNYL (DURAGESIC - DOSED MCG/HR) 50 MCG/HR Place 1 patch (50 mcg total) onto the skin every 3 (three) days.  10 patch  0  . ferrous gluconate (FERGON) 325 MG tablet Take 325 mg by mouth 2 (two) times daily.        Marland Kitchen  L-Methylfolate-B6-B12 2.8-25-2 MG TABS Take by mouth. Daily        . levothyroxine (SYNTHROID, LEVOTHROID) 25 MCG tablet TAKE ONE TABLET BY MOUTH ONE TIME DAILY  90 tablet  2  . LORazepam (ATIVAN) 1 MG tablet Take by mouth. 1 in the AM and 2 1/2 at night      . methocarbamol (ROBAXIN) 500 MG tablet Take 500 mg by mouth 3 (three) times daily.        Marland Kitchen OLANZapine (ZYPREXA) 2.5 MG tablet Take 2.5 mg by mouth every morning.        Marland Kitchen oxyCODONE-acetaminophen (PERCOCET) 10-325 MG per tablet Take 1 tablet by mouth every 6 (six) hours as needed.       . pantoprazole (PROTONIX) 40 MG tablet TAKE ONE TABLET BY MOUTH TWICE DAILY  180 tablet  4  . predniSONE (DELTASONE) 10 MG tablet Take 10 mg by mouth daily.      . predniSONE (DELTASONE) 5 MG tablet Take 5 mg by mouth. Taking half tab daily      . solifenacin (VESICARE) 10 MG tablet Take 0.5 tablets (5 mg total) by mouth daily.   90 tablet  6  . Teriparatide, Recombinant, (FORTEO) 600 MCG/2.4ML SOLN Inject 20 mcg into the skin every morning.        . topiramate (TOPAMAX) 100 MG tablet Take 1 tablet (100 mg total) by mouth 2 (two) times daily.  180 tablet  6  . traMADol (ULTRAM) 50 MG tablet Take 1 tablet (50 mg total) by mouth every 6 (six) hours as needed.  90 tablet  6  . valACYclovir (VALTREX) 500 MG tablet        No current facility-administered medications on file prior to visit.    BP 128/70  Pulse 72  Temp(Src) 98 F (36.7 C) (Oral)  Wt 237 lb (107.502 kg)  BMI 35.26 kg/m2  LMP 12/19/2012    Objective:   Physical Exam  Constitutional: She is oriented to person, place, and time. She appears well-developed and well-nourished. No distress.  HENT:  Head: Normocephalic and atraumatic.  Cardiovascular: Normal rate, regular rhythm and normal heart sounds.   No murmur heard. Pulmonary/Chest: Effort normal and breath sounds normal. She has no wheezes.  Abdominal: Soft. Bowel sounds are normal.  Mild suprapubic tenderness  Musculoskeletal: She exhibits no edema.  Neurological: She is alert and oriented to person, place, and time. A cranial nerve deficit is present.  Psychiatric: She has a normal mood and affect. Her behavior is normal.          Assessment & Plan:

## 2013-02-15 NOTE — Assessment & Plan Note (Signed)
Patient followed by psychiatry. She is currently on Cymbalta ,Abilify and Zyprexa. She has gained 80 pounds within the last 2 or 3 years. Screen for type II diabetes.

## 2013-02-15 NOTE — Assessment & Plan Note (Signed)
47 year old Hispanic female presents with symptoms of UTI. Treat with cefuroxime 500 mg twice daily for 5 days. Avoid ciprofloxacin due to potential interaction with her atypical psychiatric medication.  Increase fluid intake.  Please contact our office if your symptoms do not improve or gets worse.

## 2013-02-15 NOTE — Patient Instructions (Addendum)
Increase fluid intake Please contact our office if your symptoms do not improve or gets worse.

## 2013-02-16 LAB — CBC WITH DIFFERENTIAL/PLATELET
Basophils Absolute: 0.1 10*3/uL (ref 0.0–0.1)
Basophils Relative: 0.9 % (ref 0.0–3.0)
Eosinophils Absolute: 0 10*3/uL (ref 0.0–0.7)
Hemoglobin: 11.3 g/dL — ABNORMAL LOW (ref 12.0–15.0)
Lymphocytes Relative: 8.4 % — ABNORMAL LOW (ref 12.0–46.0)
MCHC: 32.2 g/dL (ref 30.0–36.0)
MCV: 84.7 fl (ref 78.0–100.0)
Monocytes Absolute: 0.3 10*3/uL (ref 0.1–1.0)
Neutro Abs: 8.2 10*3/uL — ABNORMAL HIGH (ref 1.4–7.7)
RDW: 19.9 % — ABNORMAL HIGH (ref 11.5–14.6)

## 2013-02-16 LAB — BASIC METABOLIC PANEL
CO2: 22 mEq/L (ref 19–32)
Calcium: 8.7 mg/dL (ref 8.4–10.5)
Chloride: 111 mEq/L (ref 96–112)
Creatinine, Ser: 0.9 mg/dL (ref 0.4–1.2)
Glucose, Bld: 112 mg/dL — ABNORMAL HIGH (ref 70–99)

## 2013-02-16 LAB — C3 AND C4: C4 Complement: 25 mg/dL (ref 10–40)

## 2013-02-17 ENCOUNTER — Telehealth: Payer: Self-pay | Admitting: Internal Medicine

## 2013-02-17 NOTE — Telephone Encounter (Signed)
Pt called and stated that she would like to receive a call with the final results of her labs completed on 3/19. Please assist.

## 2013-02-20 ENCOUNTER — Other Ambulatory Visit: Payer: Self-pay | Admitting: Internal Medicine

## 2013-02-20 NOTE — Telephone Encounter (Signed)
Spoke to pt told her all the laboratory testing normal except for mild stable anemia and hemoglobin A1c of 6.7 slighltly elevated. Pt verbalized understanding.

## 2013-02-20 NOTE — Telephone Encounter (Signed)
All the laboratory testing normal except for mild stable anemia and hemoglobin A1c of 6.7

## 2013-02-23 ENCOUNTER — Other Ambulatory Visit: Payer: Self-pay | Admitting: Internal Medicine

## 2013-03-15 ENCOUNTER — Encounter: Payer: Self-pay | Admitting: Internal Medicine

## 2013-03-15 ENCOUNTER — Ambulatory Visit (INDEPENDENT_AMBULATORY_CARE_PROVIDER_SITE_OTHER): Payer: Medicare Other | Admitting: Internal Medicine

## 2013-03-15 VITALS — BP 120/80 | HR 104 | Temp 97.9°F | Resp 20 | Wt 242.0 lb

## 2013-03-15 DIAGNOSIS — IMO0001 Reserved for inherently not codable concepts without codable children: Secondary | ICD-10-CM

## 2013-03-15 DIAGNOSIS — R079 Chest pain, unspecified: Secondary | ICD-10-CM

## 2013-03-15 DIAGNOSIS — R635 Abnormal weight gain: Secondary | ICD-10-CM

## 2013-03-15 DIAGNOSIS — E119 Type 2 diabetes mellitus without complications: Secondary | ICD-10-CM

## 2013-03-15 DIAGNOSIS — M130 Polyarthritis, unspecified: Secondary | ICD-10-CM

## 2013-03-15 MED ORDER — METFORMIN HCL ER (OSM) 500 MG PO TB24: 500.0000 mg | ORAL_TABLET | Freq: Every day | ORAL | Status: DC

## 2013-03-15 NOTE — Patient Instructions (Signed)
Please check your hemoglobin A1c every 3 months  You need to lose weight.  Consider a lower calorie diet and regular exercise.    It is important that you exercise regularly, at least 20 minutes 3 to 4 times per week.  If you develop chest pain or shortness of breath seek  medical attention. 

## 2013-03-15 NOTE — Progress Notes (Signed)
Subjective:    [name], female    DOB: 09-28-1966, 47 y.o.   MRN: [id number]  HPI   47 year old patient who is seen today with a chief complaint a left anterior chest wall pain. Pain is described as sharp and worsened by movement and deep inspiration. She's also noticed some localized pain over the left anterior chest wall region. She has a history of newly diagnosed diabetes with a hemoglobin A1c of 6.7. There is been significant weight gain over the years due to inactivity and in part medication related.  Past Medical History  Diagnosis Date  . ANXIETY 06/06/2007  . BACK PAIN 02/22/2009  . DEPRESSION 06/06/2007  . FREQUENCY, URINARY 07/07/2007  . GERD 06/06/2007  . HYPOTHYROIDISM 06/06/2007  . INTERSTITIAL CYSTITIS 09/11/2010  . NEPHROLITHIASIS, HX OF 11/05/2010  . OSTEOPENIA 10/14/2009  . OVERACTIVE BLADDER 10/14/2009  . PAIN, CHRONIC NEC 06/06/2007  . POLYARTHRITIS 08/19/2007  . 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 WEIGHT GAIN 02/22/2009    History   Social History  . Marital Status: Divorced    Spouse Name: N/A    Number of Children: N/A  . Years of Education: N/A   Occupational History  . Not on file.   Social History Main Topics  . Smoking status: Never Smoker   . Smokeless tobacco: Never Used  . Alcohol Use: No  . Drug Use: No  . Sexually Active: Not on file   Other Topics Concern  . Not on file   Social History Narrative  . No narrative on file    Past Surgical History  Procedure Laterality Date  . Appendectomy    . Tonsillectomy    . Wrist reconstruction    . Revision total hip arthroplasty  06-2009    Family History  Problem Relation Age of Onset  . Hypertension Mother   . Arthritis Mother   . Asthma Sister   . Asthma Daughter     Allergies  Allergen Reactions  . Clarithromycin   . Dhea (Nutritional Supplements)   . Latex   . Penicillins   . Prasterone (Dhea)   . Promethazine Hcl     Current Outpatient Prescriptions  on File Prior to Visit  Medication Sig Dispense Refill  . albuterol (PROVENTIL HFA) 108 (90 BASE) MCG/ACT inhaler Inhale 2 puffs into the lungs every 4 (four) hours as needed.        . ARIPiprazole (ABILIFY) 10 MG tablet Take 10 mg by mouth daily.        Marland Kitchen aspirin 325 MG tablet Take 325 mg by mouth 2 (two) times daily.      Marland Kitchen azaTHIOprine (IMURAN) 50 MG tablet Take by mouth. 1 1/2 tabs qd      . Cholecalciferol (VITAMIN D) 2000 UNITS CAPS Take by mouth. Daily        . CYMBALTA 60 MG capsule TAKE ONE CAPSULE BY MOUTH TWICE DAILY  60 capsule  0  . diclofenac (VOLTAREN) 75 MG EC tablet Take 1 tablet (75 mg total) by mouth 2 (two) times daily.  180 tablet  6  . fentaNYL (DURAGESIC - DOSED MCG/HR) 50 MCG/HR Place 1 patch (50 mcg total) onto the skin every 3 (three) days.  10 patch  0  . ferrous gluconate (FERGON) 325 MG tablet Take 325 mg by mouth 2 (two) times daily.        Marland Kitchen L-Methylfolate-B6-B12 2.8-25-2 MG TABS Take by mouth. Daily        . levothyroxine (  SYNTHROID, LEVOTHROID) 25 MCG tablet TAKE ONE TABLET BY MOUTH ONE TIME DAILY  90 tablet  2  . LORazepam (ATIVAN) 1 MG tablet Take by mouth. 1 in the AM and 2 1/2 at night      . methocarbamol (ROBAXIN) 500 MG tablet Take 500 mg by mouth 3 (three) times daily.        Marland Kitchen OLANZapine (ZYPREXA) 2.5 MG tablet Take 2.5 mg by mouth every morning.        Marland Kitchen oxyCODONE-acetaminophen (PERCOCET) 10-325 MG per tablet Take 1 tablet by mouth every 6 (six) hours as needed.       . pantoprazole (PROTONIX) 40 MG tablet TAKE ONE TABLET BY MOUTH TWICE DAILY  180 tablet  4  . predniSONE (DELTASONE) 10 MG tablet Take 10 mg by mouth daily.      . predniSONE (DELTASONE) 5 MG tablet Take 5 mg by mouth. Taking half tab daily      . predniSONE (DELTASONE) 5 MG tablet TAKE TWO TABLETS BY MOUTH EVERY MORNING  180 tablet  5  . solifenacin (VESICARE) 10 MG tablet Take 0.5 tablets (5 mg total) by mouth daily.  90 tablet  6  . Teriparatide, Recombinant, (FORTEO) 600 MCG/2.4ML  SOLN Inject 20 mcg into the skin every morning.        . topiramate (TOPAMAX) 100 MG tablet Take 1 tablet (100 mg total) by mouth 2 (two) times daily.  180 tablet  6  . traMADol (ULTRAM) 50 MG tablet Take 1 tablet (50 mg total) by mouth every 6 (six) hours as needed.  90 tablet  6  . valACYclovir (VALTREX) 500 MG tablet        No current facility-administered medications on file prior to visit.    BP 120/80  Pulse 104  Temp(Src) 97.9 F (36.6 C) (Oral)  Resp 20  Wt 242 lb (109.77 kg)  BMI 36.01 kg/m2  SpO2 96%  LMP 03/13/2013       Review of Systems  Constitutional: Negative.   HENT: Negative for hearing loss, congestion, sore throat, rhinorrhea, dental problem, sinus pressure and tinnitus.   Eyes: Negative for pain, discharge and visual disturbance.  Respiratory: Negative for cough and shortness of breath.   Cardiovascular: Positive for chest pain. Negative for palpitations and leg swelling.  Gastrointestinal: Negative for nausea, vomiting, abdominal pain, diarrhea, constipation, blood in stool and abdominal distention.  Genitourinary: Negative for dysuria, urgency, frequency, hematuria, flank pain, vaginal bleeding, vaginal discharge, difficulty urinating, vaginal pain and pelvic pain.  Musculoskeletal: Negative for joint swelling, arthralgias and gait problem.  Skin: Negative for rash.  Neurological: Negative for dizziness, syncope, speech difficulty, weakness, numbness and headaches.  Hematological: Negative for adenopathy.  Psychiatric/Behavioral: Negative for behavioral problems, dysphoric mood and agitation. The patient is not nervous/anxious.        Objective:   Physical Exam  Constitutional: She is oriented to person, place, and time. She appears well-developed and well-nourished.  Overweight no distress. Normal blood pressure  HENT:  Head: Normocephalic.  Right Ear: External ear normal.  Left Ear: External ear normal.  Mouth/Throat: Oropharynx is clear and  moist.  Eyes: Conjunctivae and EOM are normal. Pupils are equal, round, and reactive to light.  Neck: Normal range of motion. Neck supple. No thyromegaly present.  Cardiovascular: Normal rate, regular rhythm, normal heart sounds and intact distal pulses.   Pulmonary/Chest: Effort normal and breath sounds normal. She exhibits tenderness.  Point tenderness over the lateral sternal area  Abdominal: Soft. Bowel  sounds are normal. She exhibits no mass. There is no tenderness.  Musculoskeletal: Normal range of motion.  Lymphadenopathy:    She has no cervical adenopathy.  Neurological: She is alert and oriented to person, place, and time.  Skin: Skin is warm and dry. No rash noted.  Psychiatric: She has a normal mood and affect. Her behavior is normal.          Assessment & Plan:   Chest wall pain. Patient was reassured. EKG reviewed Diabetes mellitus. The patient is sent for nutritional counseling. Will place on metformin 1 g daily. Recheck 3 months Depression Anxiety Hypothyroidism

## 2013-03-28 ENCOUNTER — Telehealth: Payer: Self-pay | Admitting: Internal Medicine

## 2013-03-28 ENCOUNTER — Ambulatory Visit (INDEPENDENT_AMBULATORY_CARE_PROVIDER_SITE_OTHER): Payer: Medicare Other | Admitting: Internal Medicine

## 2013-03-28 ENCOUNTER — Encounter: Payer: Self-pay | Admitting: Internal Medicine

## 2013-03-28 VITALS — BP 120/80 | HR 99 | Temp 98.5°F | Resp 20 | Wt 235.0 lb

## 2013-03-28 DIAGNOSIS — R635 Abnormal weight gain: Secondary | ICD-10-CM

## 2013-03-28 DIAGNOSIS — IMO0001 Reserved for inherently not codable concepts without codable children: Secondary | ICD-10-CM

## 2013-03-28 DIAGNOSIS — G8929 Other chronic pain: Secondary | ICD-10-CM

## 2013-03-28 NOTE — Progress Notes (Signed)
Subjective:    [name], female    DOB: 1966/05/20, 47 y.o.   MRN: [id number]  HPI  47 year old patient with multiple medical problems who is seen today for followup of her diabetes. She has a concerned about hypoglycemia. She has occasional episodes of weakness nervousness anxiety and is concerned about symptomatic hypoglycemia. Medical regimen includes metformin therapy only. No home blood sugar monitoring  Past Medical History  Diagnosis Date  . ANXIETY 06/06/2007  . BACK PAIN 02/22/2009  . DEPRESSION 06/06/2007  . FREQUENCY, URINARY 07/07/2007  . GERD 06/06/2007  . HYPOTHYROIDISM 06/06/2007  . INTERSTITIAL CYSTITIS 09/11/2010  . NEPHROLITHIASIS, HX OF 11/05/2010  . OSTEOPENIA 10/14/2009  . OVERACTIVE BLADDER 10/14/2009  . PAIN, CHRONIC NEC 06/06/2007  . POLYARTHRITIS 08/19/2007  . 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 WEIGHT GAIN 02/22/2009    History   Social History  . Marital Status: Divorced    Spouse Name: N/A    Number of Children: N/A  . Years of Education: N/A   Occupational History  . Not on file.   Social History Main Topics  . Smoking status: Never Smoker   . Smokeless tobacco: Never Used  . Alcohol Use: No  . Drug Use: No  . Sexually Active: Not on file   Other Topics Concern  . Not on file   Social History Narrative  . No narrative on file    Past Surgical History  Procedure Laterality Date  . Appendectomy    . Tonsillectomy    . Wrist reconstruction    . Revision total hip arthroplasty  06-2009    Family History  Problem Relation Age of Onset  . Hypertension Mother   . Arthritis Mother   . Asthma Sister   . Asthma Daughter     Allergies  Allergen Reactions  . Clarithromycin   . Dhea (Nutritional Supplements)   . Latex   . Penicillins   . Prasterone (Dhea)   . Promethazine Hcl     Current Outpatient Prescriptions on File Prior to Visit  Medication Sig Dispense Refill  . albuterol (PROVENTIL HFA) 108 (90 BASE)  MCG/ACT inhaler Inhale 2 puffs into the lungs every 4 (four) hours as needed.        . ARIPiprazole (ABILIFY) 10 MG tablet Take 10 mg by mouth daily.        Marland Kitchen aspirin 325 MG tablet Take 325 mg by mouth 2 (two) times daily.      Marland Kitchen azaTHIOprine (IMURAN) 50 MG tablet Take by mouth. 1 1/2 tabs qd      . Cholecalciferol (VITAMIN D) 2000 UNITS CAPS Take by mouth. Daily        . CYMBALTA 60 MG capsule TAKE ONE CAPSULE BY MOUTH TWICE DAILY  60 capsule  0  . diclofenac (VOLTAREN) 75 MG EC tablet Take 1 tablet (75 mg total) by mouth 2 (two) times daily.  180 tablet  6  . fentaNYL (DURAGESIC - DOSED MCG/HR) 50 MCG/HR Place 1 patch (50 mcg total) onto the skin every 3 (three) days.  10 patch  0  . ferrous gluconate (FERGON) 325 MG tablet Take 325 mg by mouth 2 (two) times daily.        Marland Kitchen L-Methylfolate-B6-B12 2.8-25-2 MG TABS Take by mouth. Daily        . levothyroxine (SYNTHROID, LEVOTHROID) 25 MCG tablet TAKE ONE TABLET BY MOUTH ONE TIME DAILY  90 tablet  2  . LORazepam (ATIVAN) 1 MG tablet Take by  mouth. 1 in the AM and 2 1/2 at night      . metformin (FORTAMET) 500 MG (OSM) 24 hr tablet Take 1 tablet (500 mg total) by mouth daily with breakfast.  180 tablet  4  . methocarbamol (ROBAXIN) 500 MG tablet Take 500 mg by mouth 3 (three) times daily.        Marland Kitchen OLANZapine (ZYPREXA) 2.5 MG tablet Take 2.5 mg by mouth every morning.        Marland Kitchen oxyCODONE-acetaminophen (PERCOCET) 10-325 MG per tablet Take 1 tablet by mouth every 6 (six) hours as needed.       . pantoprazole (PROTONIX) 40 MG tablet TAKE ONE TABLET BY MOUTH TWICE DAILY  180 tablet  4  . predniSONE (DELTASONE) 10 MG tablet Take 10 mg by mouth daily.      . solifenacin (VESICARE) 10 MG tablet Take 0.5 tablets (5 mg total) by mouth daily.  90 tablet  6  . Teriparatide, Recombinant, (FORTEO) 600 MCG/2.4ML SOLN Inject 20 mcg into the skin every morning.        . topiramate (TOPAMAX) 100 MG tablet Take 1 tablet (100 mg total) by mouth 2 (two) times daily.   180 tablet  6  . traMADol (ULTRAM) 50 MG tablet Take 1 tablet (50 mg total) by mouth every 6 (six) hours as needed.  90 tablet  6  . valACYclovir (VALTREX) 500 MG tablet        No current facility-administered medications on file prior to visit.    BP 120/80  Pulse 99  Temp(Src) 98.5 F (36.9 C) (Oral)  Resp 20  Wt 235 lb (106.595 kg)  BMI 34.97 kg/m2  SpO2 96%  LMP 03/13/2013       Review of Systems  HENT: Negative for hearing loss, congestion, sore throat, rhinorrhea, dental problem, sinus pressure and tinnitus.   Eyes: Negative for pain, discharge and visual disturbance.  Respiratory: Negative for cough and shortness of breath.   Cardiovascular: Negative for chest pain, palpitations and leg swelling.  Gastrointestinal: Negative for nausea, vomiting, abdominal pain, diarrhea, constipation, blood in stool and abdominal distention.  Genitourinary: Negative for dysuria, urgency, frequency, hematuria, flank pain, vaginal bleeding, vaginal discharge, difficulty urinating, vaginal pain and pelvic pain.  Musculoskeletal: Positive for myalgias, back pain, arthralgias and gait problem. Negative for joint swelling.  Skin: Negative for rash.  Neurological: Positive for weakness. Negative for dizziness, syncope, speech difficulty, numbness and headaches.  Hematological: Negative for adenopathy.  Psychiatric/Behavioral: Negative for behavioral problems, dysphoric mood and agitation. The patient is nervous/anxious.        Objective:   Physical Exam  Constitutional: She is oriented to person, place, and time. She appears well-developed and well-nourished.  HENT:  Head: Normocephalic.  Right Ear: External ear normal.  Left Ear: External ear normal.  Mouth/Throat: Oropharynx is clear and moist.  Eyes: Conjunctivae and EOM are normal. Pupils are equal, round, and reactive to light.  Neck: Normal range of motion. Neck supple. No thyromegaly present.  Cardiovascular: Normal rate, regular  rhythm, normal heart sounds and intact distal pulses.   Pulmonary/Chest: Effort normal and breath sounds normal.  Abdominal: Soft. Bowel sounds are normal. She exhibits no mass. There is no tenderness.  Musculoskeletal: Normal range of motion.  Lymphadenopathy:    She has no cervical adenopathy.  Neurological: She is alert and oriented to person, place, and time.  Skin: Skin is warm and dry. No rash noted.  Psychiatric: She has a normal mood and affect.  Her behavior is normal.          Assessment & Plan:   Diabetes mellitus. The patient is scheduled for dietary counseling next week. Will dispense additional information about diabetes as well as diabetic diet. The patient also received a home glucometer. Will return in 2 months for followup and track a hemoglobin A1c at that time. We'll continue metformin therapy at this time Polyarthritis Chronic pain syndrome

## 2013-03-28 NOTE — Patient Instructions (Addendum)
Please check your hemoglobin A1c every 3 months    It is important that you exercise regularly, at least 20 minutes 3 to 4 times per week.  If you develop chest pain or shortness of breath seek  medical attention.  You need to lose weight.  Consider a lower calorie diet and regular exercise.1800 Calorie Diet for Diabetes Meal Planning The 1800 calorie diet is designed for eating up to 1800 calories each day. Following this diet and making healthy meal choices can help improve overall health. This diet controls blood sugar (glucose) levels and can also help lower blood pressure and cholesterol. SERVING SIZES Measuring foods and serving sizes helps to make sure you are getting the right amount of food. The list below tells how big or small some common serving sizes are:  1 oz.........4 stacked dice.  3 oz........Marland KitchenDeck of cards.  1 tsp.......Marland KitchenTip of little finger.  1 tbs......Marland KitchenMarland KitchenThumb.  2 tbs.......Marland KitchenGolf ball.   cup......Marland KitchenHalf of a fist.  1 cup.......Marland KitchenA fist. GUIDELINES FOR CHOOSING FOODS The goal of this diet is to eat a variety of foods and limit calories to 1800 each day. This can be done by choosing foods that are low in calories and fat. The diet also suggests eating small amounts of food frequently. Doing this helps control your blood glucose levels so they do not get too high or too low. Each meal or snack may include a protein food source to help you feel more satisfied and to stabilize your blood glucose. Try to eat about the same amount of food around the same time each day. This includes weekend days, travel days, and days off work. Space your meals about 4 to 5 hours apart and add a snack between them if you wish.  For example, a daily food plan could include breakfast, a morning snack, lunch, dinner, and an evening snack. Healthy meals and snacks include whole grains, vegetables, fruits, lean meats, poultry, fish, and dairy products. As you plan your meals, select a variety of  foods. Choose from the bread and starch, vegetable, fruit, dairy, and meat/protein groups. Examples of foods from each group and their suggested serving sizes are listed below. Use measuring cups and spoons to become familiar with what a healthy portion looks like. Bread and Starch Each serving equals 15 grams of carbohydrates.  1 slice bread.   bagel.   cup cold cereal (unsweetened).   cup hot cereal or mashed potatoes.  1 small potato (size of a computer mouse).   cup cooked pasta or rice.   English muffin.  1 cup broth-based soup.  3 cups of popcorn.  4 to 6 whole-wheat crackers.   cup cooked beans, peas, or corn. Vegetable Each serving equals 5 grams of carbohydrates.   cup cooked vegetables.  1 cup raw vegetables.   cup tomato or vegetable juice. Fruit Each serving equals 15 grams of carbohydrates.  1 small apple or orange.  1 cup watermelon or strawberries.   cup applesauce (no sugar added).  2 tbs raisins.   banana.   cup canned fruit, packed in water, its own juice, or sweetened with a sugar substitute.   cup unsweetened fruit juice. Dairy Each serving equals 12 to 15 grams of carbohydrates.  1 cup fat-free milk.  6 oz artificially sweetened yogurt or plain yogurt.  1 cup low-fat buttermilk.  1 cup soy milk.  1 cup almond milk. Meat/Protein  1 large egg.  2 to 3 oz meat, poultry, or fish.  cup low-fat cottage cheese.  1 tbs peanut butter.  1 oz low-fat cheese.   cup tuna in water.   cup tofu. Fat  1 tsp oil.  1 tsp trans-fat-free margarine.  1 tsp butter.  1 tsp mayonnaise.  2 tbs avocado.  1 tbs salad dressing.  1 tbs cream cheese.  2 tbs sour cream. SAMPLE 1800 CALORIE DIET PLAN Breakfast   cup unsweetened cereal (1 carb serving).  1 cup fat-free milk (1 carb serving).  1 slice whole-wheat toast (1 carb serving).   small banana (1 carb serving).  1 scrambled egg.  1 tsp trans-fat-free  margarine. Lunch  Tuna sandwich.  2 slices whole-wheat bread (2 carb servings).   cup canned tuna in water, drained.  1 tbs reduced fat mayonnaise.  1 stalk celery, chopped.  2 slices tomato.  1 lettuce leaf.  1 cup carrot sticks.  24 to 30 seedless grapes (2 carb servings).  6 oz light yogurt (1 carb serving). Afternoon Snack  3 graham cracker squares (1 carb serving).  Fat-free milk, 1 cup (1 carb serving).  1 tbs peanut butter. Dinner  3 oz salmon, broiled with 1 tsp oil.  1 cup mashed potatoes (2 carb servings) with 1 tsp trans-fat-free margarine.  1 cup fresh or frozen green beans.  1 cup steamed asparagus.  1 cup fat-free milk (1 carb serving). Evening Snack  3 cups air-popped popcorn (1 carb serving).  2 tbs parmesan cheese sprinkled on top. MEAL PLAN Use this worksheet to help you make a daily meal plan based on the 1800 calorie diet suggestions. If you are using this plan to help you control your blood glucose, you may interchange carbohydrate-containing foods (dairy, starches, and fruits). Select a variety of fresh foods of varying colors and flavors. The total amount of carbohydrate in your meals or snacks is more important than making sure you include all of the food groups every time you eat. Choose from the following foods to build your day's meals:  8 Starches.  4 Vegetables.  3 Fruits.  2 Dairy.  6 to 7 oz Meat/Protein.  Up to 4 Fats. Your dietician can use this worksheet to help you decide how many servings and which types of foods are right for you. BREAKFAST Food Group and Servings / Food Choice Starch ________________________________________________________ Dairy _________________________________________________________ Fruit _________________________________________________________ Meat/Protein __________________________________________________ Fat ___________________________________________________________ LUNCH Food Group and  Servings / Food Choice Starch ________________________________________________________ Meat/Protein __________________________________________________ Vegetable _____________________________________________________ Fruit _________________________________________________________ Dairy _________________________________________________________ Fat ___________________________________________________________ Aura Fey Food Group and Servings / Food Choice Starch ________________________________________________________ Meat/Protein __________________________________________________ Fruit __________________________________________________________ Dairy _________________________________________________________ Laural Golden Food Group and Servings / Food Choice Starch _________________________________________________________ Meat/Protein ___________________________________________________ Dairy __________________________________________________________ Vegetable ______________________________________________________ Fruit ___________________________________________________________ Fat ____________________________________________________________ Lollie Sails Food Group and Servings / Food Choice Fruit __________________________________________________________ Meat/Protein ___________________________________________________ Dairy __________________________________________________________ Starch _________________________________________________________ DAILY TOTALS Starch ____________________________ Vegetable _________________________ Fruit _____________________________ Dairy _____________________________ Meat/Protein______________________ Fat _______________________________ Document Released: 06/08/2005 Document Revised: 02/08/2012 Document Reviewed: 10/02/2011 [employer] Patient Information 2013 [place], [place]. Diabetes and Exercise Regular exercise is important and can help:   Control  blood glucose (sugar).  Decrease blood pressure.    Control blood lipids (cholesterol, triglycerides).  Improve overall health. BENEFITS FROM EXERCISE  Improved fitness.  Improved flexibility.  Improved endurance.  Increased bone density.  Weight control.  Increased muscle strength.  Decreased body fat.  Improvement of the body's use of insulin, a hormone.  Increased insulin sensitivity.  Reduction of insulin needs.  Reduced stress and tension.  Helps you feel better. People  with diabetes who add exercise to their lifestyle gain additional benefits, including:  Weight loss.  Reduced appetite.  Improvement of the body's use of blood glucose.  Decreased risk factors for heart disease:  Lowering of cholesterol and triglycerides.  Raising the level of good cholesterol (high-density lipoproteins, HDL).  Lowering blood sugar.  Decreased blood pressure. TYPE 1 DIABETES AND EXERCISE  Exercise will usually lower your blood glucose.  If blood glucose is greater than 240 mg/dl, check urine ketones. If ketones are present, do not exercise.  Location of the insulin injection sites may need to be adjusted with exercise. Avoid injecting insulin into areas of the body that will be exercised. For example, avoid injecting insulin into:  The arms when playing tennis.  The legs when jogging. For more information, discuss this with your caregiver.  Keep a record of:  Food intake.  Type and amount of exercise.  Expected peak times of insulin action.  Blood glucose levels. Do this before, during, and after exercise. Review your records with your caregiver. This will help you to develop guidelines for adjusting food intake and insulin amounts.  TYPE 2 DIABETES AND EXERCISE  Regular physical activity can help control blood glucose.  Exercise is important because it may:  Increase the body's sensitivity to insulin.  Improve blood glucose control.  Exercise  reduces the risk of heart disease. It decreases serum cholesterol and triglycerides. It also lowers blood pressure.  Those who take insulin or oral hypoglycemic agents should watch for signs of hypoglycemia. These signs include dizziness, shaking, sweating, chills, and confusion.  Body water is lost during exercise. It must be replaced. This will help to avoid loss of body fluids (dehydration) or heat stroke. Be sure to talk to your caregiver before starting an exercise program to make sure it is safe for you. Remember, any activity is better than none.  Document Released: 02/06/2004 Document Revised: 02/08/2012 Document Reviewed: 05/23/2009 [facility] Patient Information 2013 [place], [place]. Diabetes Meal Planning Guide The diabetes meal planning guide is a tool to help you plan your meals and snacks. It is important for people with diabetes to manage their blood glucose (sugar) levels. Choosing the right foods and the right amounts throughout your day will help control your blood glucose. Eating right can even help you improve your blood pressure and reach or maintain a healthy weight. CARBOHYDRATE COUNTING MADE EASY When you eat carbohydrates, they turn to sugar. This raises your blood glucose level. Counting carbohydrates can help you control this level so you feel better. When you plan your meals by counting carbohydrates, you can have more flexibility in what you eat and balance your medicine with your food intake. Carbohydrate counting simply means adding up the total amount of carbohydrate grams in your meals and snacks. Try to eat about the same amount at each meal. Foods with carbohydrates are listed below. Each portion below is 1 carbohydrate serving or 15 grams of carbohydrates. Ask your dietician how many grams of carbohydrates you should eat at each meal or snack. Grains and Starches  1 slice bread.   English muffin or hotdog/hamburger bun.   cup cold cereal (unsweetened).   cup  cooked pasta or rice.   cup starchy vegetables (corn, potatoes, peas, beans, winter squash).  1 tortilla (6 inches).   bagel.  1 waffle or pancake (size of a CD).   cup cooked cereal.  4 to 6 small crackers. *Whole grain is recommended. Fruit  1 cup fresh unsweetened berries,  melon, papaya, pineapple.  1 small fresh fruit.   banana or mango.   cup fruit juice (4 oz unsweetened).   cup canned fruit in natural juice or water.  2 tbs dried fruit.  12 to 15 grapes or cherries. Milk and Yogurt  1 cup fat-free or 1% milk.  1 cup soy milk.  6 oz light yogurt with sugar-free sweetener.  6 oz low-fat soy yogurt.  6 oz plain yogurt. Vegetables  1 cup raw or  cup cooked is counted as 0 carbohydrates or a "free" food.  If you eat 3 or more servings at 1 meal, count them as 1 carbohydrate serving. Other Carbohydrates   oz chips or pretzels.   cup ice cream or frozen yogurt.   cup sherbet or sorbet.  2 inch square cake, no frosting.  1 tbs honey, sugar, jam, jelly, or syrup.  2 small cookies.  3 squares of graham crackers.  3 cups popcorn.  6 crackers.  1 cup broth-based soup.  Count 1 cup casserole or other mixed foods as 2 carbohydrate servings.  Foods with less than 20 calories in a serving may be counted as 0 carbohydrates or a "free" food. You may want to purchase a book or computer software that lists the carbohydrate gram counts of different foods. In addition, the nutrition facts panel on the labels of the foods you eat are a good source of this information. The label will tell you how big the serving size is and the total number of carbohydrate grams you will be eating per serving. Divide this number by 15 to obtain the number of carbohydrate servings in a portion. Remember, 1 carbohydrate serving equals 15 grams of carbohydrate. SERVING SIZES Measuring foods and serving sizes helps you make sure you are getting the right amount of food.  The list below tells how big or small some common serving sizes are.  1 oz.........4 stacked dice.  3 oz........Marland KitchenDeck of cards.  1 tsp.......Marland KitchenTip of little finger.  1 tbs......Marland KitchenMarland KitchenThumb.  2 tbs.......Marland KitchenGolf ball.   cup......Marland KitchenHalf of a fist.  1 cup.......Marland KitchenA fist. SAMPLE DIABETES MEAL PLAN Below is a sample meal plan that includes foods from the grain and starches, dairy, vegetable, fruit, and meat groups. A dietician can individualize a meal plan to fit your calorie needs and tell you the number of servings needed from each food group. However, controlling the total amount of carbohydrates in your meal or snack is more important than making sure you include all of the food groups at every meal. You may interchange carbohydrate containing foods (dairy, starches, and fruits). The meal plan below is an example of a 2000 calorie diet using carbohydrate counting. This meal plan has 17 carbohydrate servings. Breakfast  1 cup oatmeal (2 carb servings).   cup light yogurt (1 carb serving).  1 cup blueberries (1 carb serving).   cup almonds. Snack  1 large apple (2 carb servings).  1 low-fat string cheese stick. Lunch  Chicken breast salad.  1 cup spinach.   cup chopped tomatoes.  2 oz chicken breast, sliced.  2 tbs low-fat [place] dressing.  12 whole-wheat crackers (2 carb servings).  12 to 15 grapes (1 carb serving).  1 cup low-fat milk (1 carb serving). Snack  1 cup carrots.   cup hummus (1 carb serving). Dinner  3 oz broiled salmon.  1 cup brown rice (3 carb servings). Snack  1  cups steamed broccoli (1 carb serving) drizzled with 1 tsp olive oil and  lemon juice.  1 cup light pudding (2 carb servings). DIABETES MEAL PLANNING WORKSHEET Your dietician can use this worksheet to help you decide how many servings of foods and what types of foods are right for you.  BREAKFAST Food Group and Servings / Carb Servings Grain/Starches  __________________________________ Dairy __________________________________________ Vegetable ______________________________________ Fruit ___________________________________________ Meat __________________________________________ Fat ____________________________________________ LUNCH Food Group and Servings / Carb Servings Grain/Starches ___________________________________ Dairy ___________________________________________ Fruit ____________________________________________ Meat ___________________________________________ Fat _____________________________________________ Laural Golden Food Group and Servings / Carb Servings Grain/Starches ___________________________________ Dairy ___________________________________________ Fruit ____________________________________________ Meat ___________________________________________ Fat _____________________________________________ SNACKS Food Group and Servings / Carb Servings Grain/Starches ___________________________________ Dairy ___________________________________________ Vegetable _______________________________________ Fruit ____________________________________________ Meat ___________________________________________ Fat _____________________________________________ DAILY TOTALS Starches _________________________ Vegetable ________________________ Fruit ____________________________ Dairy ____________________________ Meat ____________________________ Fat ______________________________ Document Released: 08/13/2005 Document Revised: 02/08/2012 Document Reviewed: 06/24/2009 [employer] Patient Information 2013 [place], [place]. Diabetes, Eating Away From Home Sometimes, you might eat in a restaurant or have meals that are prepared by someone else. You can enjoy eating out. However, the portions in restaurants may be much larger than needed. Listed below are some ideas to help you choose foods that will keep your blood glucose (sugar) in better  control.  TIPS FOR EATING OUT  Know your meal plan and how many carbohydrate servings you should have at each meal. You may wish to carry a copy of your meal plan in your purse or wallet. Learn the foods included in each food group.  Make a list of restaurants near you that offer healthy choices. Take a copy of the carry-out menus to see what they offer. Then, you can plan what you will order ahead of time.  Become familiar with serving sizes by practicing them at home using measuring cups and spoons. Once you learn to recognize portion sizes, you will be able to correctly estimate the amount of total carbohydrate you are allowed to eat at the restaurant. Ask for a takeout box if the portion is more than you should have. When your food comes, leave the amount you should have on the plate, and put the rest in the takeout box before you start eating.  Plan ahead if your mealtime will be different from usual. Check with your caregiver to find out how to time meals and medicine if you are taking insulin.  Avoid high-fat foods, such as fried foods, cream sauces, high-fat salad dressings, or any added butter or margarine.  Do not be afraid to ask questions. Ask your server about the portion size, cooking methods, ingredients and if items can be substituted. Restaurants do not list all available items on the menu. You can ask for your main entree to be prepared using skim milk, oil instead of butter or margarine, and without gravy or sauces. Ask your waiter or waitress to serve salad dressings, gravy, sauces, margarine, and sour cream on the side. You can then add the amount your meal plan suggests.  Add more vegetables whenever possible.  Avoid items that are labeled "jumbo," "giant," "deluxe," or "supersized."  You may want to split an entre with someone and order an extra side salad.  Watch for hidden calories in foods like croutons, bacon, or cheese.  Ask your server to take away the bread  basket or chips from your table.  Order a dinner salad as an appetizer. You can eat most foods served in a restaurant. Some foods are better choices than others. Breads and Starches  Recommended: All kinds of bread (wheat, rye, white, oatmeal, [place], [place], raisin),  hard or soft dinner rolls, frankfurter or hamburger buns, small bagels, small corn or whole-wheat flour tortillas.  Avoid: Frosted or glazed breads, butter rolls, egg or cheese breads, croissants, sweet rolls, pastries, coffee cake, glazed or frosted doughnuts, muffins. Crackers  Recommended: Animal crackers, graham, rye, saltine, oyster, and matzoth crackers. Bread sticks, melba toast, rusks, pretzels, popcorn (without fat), zwieback toast.  Avoid: High-fat snack crackers or chips. Buttered popcorn. Cereals  Recommended: Hot and cold cereals. Whole grains such as oatmeal or shredded wheat are good choices.  Avoid: Sugar-coated or granola type cereals. Potatoes/Pasta/Rice/Beans  Recommended: Order baked, boiled, or mashed potatoes, rice or noodles without added fat, whole beans. Order gravies, butter, margarine, or sauces on the side so you can control the amount you add.  Avoid: Hash browns or fried potatoes. Potatoes, pasta, or rice prepared with cream or cheese sauce. Potato or pasta salads prepared with large amounts of dressing. Fried beans or fried rice. Vegetables  Recommended: Order steamed, baked, boiled, or stewed vegetables without sauces or extra fat. Ask that sauce be served on the side. If vegetables are not listed on the menu, ask what is available.  Avoid: Vegetables prepared with cream, butter, or cheese sauce. Fried vegetables. Salad Bars  Recommended: Many of the vegetables at a salad bar are considered "free." Use lemon juice, vinegar, or low-calorie salad dressing (fewer than 20 calories per serving) as "free" dressings for your salad. Look for salad bar ingredients that have no added fat or sugar  such as tomatoes, lettuce, cucumbers, broccoli, carrots, onions, and mushrooms.  Avoid: Prepared salads with large amounts of dressing, such as coleslaw, caesar salad, macaroni salad, bean salad, or carrot salad. Fruit  Recommended: Eat fresh fruit or fresh fruit salad without added dressing. A salad bar often offers fresh fruit choices, but canned fruit at a restaurant is usually packed in sugar or syrup.  Avoid: Sweetened canned or frozen fruits, plain or sweetened fruit juice. Fruit salads with dressing, sour cream, or sugar added to them. Meat and Meat Substitutes  Recommended: Order broiled, baked, roasted, or grilled meat, poultry, or fish. Trim off all visible fat. Do not eat the skin of poultry. The size stated on the menu is the raw weight. Meat shrinks by  in cooking (for example, 4 oz raw equals 3 oz cooked meat).  Avoid: Deep-fat fried meat, poultry, or fish. Breaded meats. Eggs  Recommended: Order soft, hard-cooked, poached, or scrambled eggs. Omelets may be okay, depending on what ingredients are added. Egg substitutes are also a good choice.  Avoid: Fried eggs, eggs prepared with cream or cheese sauce. Milk  Recommended: Order low-fat or fat-free milk according to your meal plan. Plain, nonfat yogurt or flavored yogurt with no sugar added may be used as a substitute for milk. Soy milk may also be used.  Avoid: Milk shakes or sweetened milk beverages. Soups and Combination Foods  Recommended: Clear broth or consomm are "free" foods and may be used as an appetizer. Broth-based soups with fat removed count as a starch serving and are preferred over cream soups. Soups made with beans or split peas may be eaten but count as a starch.  Avoid: Fatty soups, soup made with cream, cheese soup. Combination foods prepared with excessive amounts of fat or with cream or cheese sauces. Desserts and Sweets  Recommended: Ask for fresh fruit. Sponge or angel food cake without icing, ice  milk, no sugar added ice cream, sherbet, or frozen yogurt may fit into your  meal plan occasionally.  Avoid: Pastries, puddings, pies, cakes with icing, custard, gelatin desserts. Fats and Oils  Recommended: Choose healthy fats such as olive oil, canola oil, or tub margarine, reduced fat or fat-free sour cream, cream cheese, avocado, or nuts.  Avoid: Any fats in excess of your allowed portion. Deep-fried foods or any food with a large amount of fat. Note: Ask for all fats to be served on the side, and limit your portion sizes according to your meal plan. Document Released: 11/16/2005 Document Revised: 02/08/2012 Document Reviewed: 06/06/2009 [facility] Patient Information 2013 [place], [place]. Diabetes, Type 2 Diabetes is a long-lasting (chronic) disease. In type 2 diabetes, the pancreas does not make enough insulin (a hormone), and the body does not respond normally to the insulin that is made. This type of diabetes was also previously called adult-onset diabetes. It usually occurs after the age of 49, but it can occur at any age.  CAUSES  Type 2 diabetes happens because the pancreasis not making enough insulin or your body has trouble using the insulin that your pancreas does make properly. SYMPTOMS   Drinking more than usual.  Urinating more than usual.  Blurred vision.  Dry, itchy skin.  Frequent infections.  Feeling more tired than usual (fatigue). DIAGNOSIS The diagnosis of type 2 diabetes is usually made by one of the following tests:  Fasting blood glucose test. You will not eat for at least 8 hours and then take a blood test.  Random blood glucose test. Your blood glucose (sugar) is checked at any time of the day regardless of when you ate.  Oral glucose tolerance test (OGTT). Your blood glucose is measured after you have not eaten (fasted) and then after you drink a glucose containing beverage. TREATMENT   Healthy eating.  Exercise.  Medicine, if  needed.  Monitoring blood glucose.  Seeing your caregiver regularly. HOME CARE INSTRUCTIONS   Check your blood glucose at least once a day. More frequent monitoring may be necessary, depending on your medicines and on how well your diabetes is controlled. Your caregiver will advise you.  Take your medicine as directed by your caregiver.  Do not smoke.  Make wise food choices. Ask your caregiver for information. Weight loss can improve your diabetes.  Learn about low blood glucose (hypoglycemia) and how to treat it.  Get your eyes checked regularly.  Have a yearly physical exam. Have your blood pressure checked and your blood and urine tested.  Wear a pendant or bracelet saying that you have diabetes.  Check your feet every night for cuts, sores, blisters, and redness. Let your caregiver know if you have any problems. SEEK MEDICAL CARE IF:   You have problems keeping your blood glucose in target range.  You have problems with your medicines.  You have symptoms of an illness that do not improve after 24 hours.  You have a sore or wound that is not healing.  You notice a change in vision or a new problem with your vision.  You have a fever. MAKE SURE YOU:  Understand these instructions.  Will watch your condition.  Will get help right away if you are not doing well or get worse. Document Released: 11/16/2005 Document Revised: 02/08/2012 Document Reviewed: 05/04/2011 [facility] Patient Information 2013 [place], [place].

## 2013-03-28 NOTE — Telephone Encounter (Signed)
Patient Information:  Caller Name: Glen  Phone: [phone]  Patient: [name] [name], [name] [name]  Gender: Female  DOB: 1966-01-15  Age: 47 Years  PCP: Eleonore Chiquito [facility])  Pregnant: No  Office Follow Up:  Does the office need to follow up with this patient?: No  Instructions For The Office: N/A   Symptoms  Reason For Call & Symptoms: Patient states she was to take Metformin 500 mg ER once daily for a week and then increase to BID starting on  03/27/13. Had symptoms 03/25/13 at approx. 1900  with dizziness, nausea and feeling faint.  She has not checked her glucose; states she has no meter and does not see the nutritionist until 04/05/13.  Reviewed EMR and see that patient is to take 1 gram Metformin daily.  Emergent symptoms ruled out.  Advised see provider today regarding her treatment plan. She agreed.  Reviewed Health History In EMR: Yes  Reviewed Medications In EMR: Yes  Reviewed Allergies In EMR: Yes  Reviewed Surgeries / Procedures: Yes  Date of Onset of Symptoms: 03/25/2013  Treatments Tried: Ate 2 apples, sat and rested until symptoms resolved  Treatments Tried Worked: Yes OB / GYN:  LMP: 03/01/2013  Guideline(s) Used:  Diabetes - Low Blood Sugar  Disposition Per Guideline:   See Today in Office  Reason For Disposition Reached:   Patient wants to be seen  Advice Given:  Reassurance  Low blood sugar can result from taking too much diabetes medication, delayed meals, strenuous exercise, or a combination of these factors.  Definition  Symptoms of mild hypoglycemia: Shakiness, weakness, not thinking clearly, headache, trembling, sweating, dizziness, palpitations, and hunger.  Treatment  Milk (1 cup; 240 ml)  Orange juice (1/2 cup; 120 ml)  Pre-packaged juice box (1 box)  Expected Course:  The symptoms of hypoglycemia should resolve in 10-15 minutes. After the symptoms resolve, eat a small snack to prevent this from recurring. Examples include: cheese and  crackers, a glass of milk, or half a sandwich.  Prevention  Meals: Do not skip or delay meals. Try to eat meals and snacks at the same time every day.  Glucose: Keep some type of sugar (e.g., glucose tablets or gels, honey, juice box) with you at all times. Do you have them available at work, at school, during exercise, and in your car?  Dieting: Talk with your doctor before starting a weight-loss program.  Inform Your Friends and Family  If you take insulin or any other diabetic medication, you are at risk of having a hypoglycemic spell. Inform your family, close friends, and coworkers that you have diabetes and what to do if you have hypoglycemia.  Wear a medical alert bracelet that identifies that you have diabetes.  Call Back If:  You have more questions  You become worse.  Patient Will Follow Care Advice:  YES  Appointment Scheduled:  03/28/2013 14:30:00 Appointment Scheduled Provider:  Eleonore Chiquito [facility])

## 2013-03-29 ENCOUNTER — Other Ambulatory Visit: Payer: Self-pay | Admitting: *Deleted

## 2013-03-29 MED ORDER — BAYER MICROLET LANCETS MISC: 1.0000 | Freq: Every day | Status: DC | PRN

## 2013-03-29 MED ORDER — GLUCOSE BLOOD VI STRP: 1.0000 | ORAL_STRIP | Freq: Every day | Status: DC | PRN

## 2013-04-06 ENCOUNTER — Encounter: Payer: Self-pay | Admitting: *Deleted

## 2013-04-06 ENCOUNTER — Encounter: Payer: Medicare Other | Attending: Internal Medicine | Admitting: *Deleted

## 2013-04-06 VITALS — Ht 70.0 in | Wt 233.4 lb

## 2013-04-06 DIAGNOSIS — IMO0001 Reserved for inherently not codable concepts without codable children: Secondary | ICD-10-CM

## 2013-04-06 DIAGNOSIS — Z713 Dietary counseling and surveillance: Secondary | ICD-10-CM | POA: Insufficient documentation

## 2013-04-06 DIAGNOSIS — E119 Type 2 diabetes mellitus without complications: Secondary | ICD-10-CM | POA: Insufficient documentation

## 2013-04-06 NOTE — Progress Notes (Signed)
  Patient was seen on 04/06/13 for the first of a series of three diabetes self-management courses at the [facility]. The following learning objectives were met by the patient during this course:   Defines the role of glucose and insulin  Identifies type of diabetes and pathophysiology  Defines the diagnostic criteria for diabetes and prediabetes  States the risk factors for Type 2 Diabetes  States the symptoms of Type 2 Diabetes  Defines Type 2 Diabetes treatment goals  Defines Type 2 Diabetes treatment options  States the rationale for glucose monitoring  Identifies A1C, glucose targets, and testing times  Identifies proper sharps disposal  Defines the purpose of a diabetes food plan  Identifies carbohydrate food groups  Defines effects of carbohydrate foods on glucose levels  Identifies carbohydrate choices/grams/food labels  States benefits of physical activity and effect on glucose  Review of suggested activity guidelines  Handouts given during class include:  Type 2 Diabetes: Basics Book  My Food Plan Book  Food and Activity Log    Follow-Up Plan: Attend core 2 and core 3   

## 2013-04-06 NOTE — Patient Instructions (Signed)
Goals:  Follow Diabetes Meal Plan as instructed  Eat 3 meals and 2 snacks, every 3-5 hrs  Limit carbohydrate intake to 30-45 grams carbohydrate/meal  Limit carbohydrate intake to 15 grams carbohydrate/snack  Add lean protein foods to meals/snacks  Monitor glucose levels as instructed by your doctor  Aim for 30 mins of physical activity daily  Bring food record and glucose log to your next nutrition visit 

## 2013-04-27 ENCOUNTER — Encounter: Payer: Medicare Other | Admitting: Dietician

## 2013-04-27 DIAGNOSIS — IMO0001 Reserved for inherently not codable concepts without codable children: Secondary | ICD-10-CM

## 2013-04-27 NOTE — Progress Notes (Signed)
  Patient was seen on 04/27/2013 for the second of a series of three diabetes self-management courses at the [facility]. The following learning objectives were met by the patient during this course:   Explain basic meter maintenance and quality assurance  Describe causes, symptoms and treatment of hypoglycemia and hyperglycemia  Explain how to manage diabetes during illness  Describe the importance of good nutrition for health and healthy eating strategies  List strategies to follow meal plan when dining out  Describe the effects of alcohol on glucose and how to use it safely  Describe problem solving skills for day-to-day glucose challenges  Describe strategies to use when treatment plan needs to change  Identify important factors involved in successful weight loss  Describe ways to remain physically active  Describe the impact of regular activity on insulin resistance  Identify diabetes medications being personally used ant their primary action for lowering blood glucose and possible side effects   Handouts given in class:  Refrigerator magnet for Sick Day Guidelines  [facility] Oral medication/insulin handout  Nutritional Strategies for Weight Loss with Diabetes  Follow-Up Plan: Patient will attend the final class of the ADA Diabetes Self-Care Education.

## 2013-05-01 ENCOUNTER — Telehealth: Payer: Self-pay | Admitting: Internal Medicine

## 2013-05-01 MED ORDER — METFORMIN HCL ER (OSM) 500 MG PO TB24: 500.0000 mg | ORAL_TABLET | Freq: Two times a day (BID) | ORAL | Status: DC

## 2013-05-01 NOTE — Telephone Encounter (Signed)
PT requesting a refill of her metformin (FORTAMET) 500 MG (OSM) 24 hr tablet, for twice a day. But her bottle says once a day, so she'll need a new RX. Please assist.

## 2013-05-01 NOTE — Telephone Encounter (Signed)
Pt notified Rx refill sent to pharmacy as requested. 

## 2013-05-05 ENCOUNTER — Ambulatory Visit: Payer: Self-pay | Admitting: Obstetrics and Gynecology

## 2013-05-11 ENCOUNTER — Encounter: Payer: Medicare Other | Attending: Internal Medicine | Admitting: *Deleted

## 2013-05-11 DIAGNOSIS — E119 Type 2 diabetes mellitus without complications: Secondary | ICD-10-CM | POA: Insufficient documentation

## 2013-05-11 DIAGNOSIS — Z713 Dietary counseling and surveillance: Secondary | ICD-10-CM | POA: Insufficient documentation

## 2013-05-11 DIAGNOSIS — IMO0001 Reserved for inherently not codable concepts without codable children: Secondary | ICD-10-CM

## 2013-05-11 NOTE — Progress Notes (Signed)
  Patient was seen on 05/11/2013 for the third of a series of three diabetes self-management courses at the [facility]. The following learning objectives were met by the patient during this course:    Describe how diabetes changes over time   Identify diabetes complications and ways to prevent them   Describe strategies that can promote heart health including lowering blood pressure and cholesterol   Describe strategies to lower dietary fat and sodium in the diet   Identify physical activities that benefit cardiovascular health   Evaluate success in meeting personal goal   Describe the belief that they can live successfully with diabetes day to day   Establish 2-3 goals that they will plan to diligently work on until they return for the free 59-month follow-up visit  The following handouts were given in class:  3 Month Follow Up Visit handout  Goal setting handout  Class evaluation form  Your patient has established the following 3 month goals for diabetes self-care:  Count carbohydrates at most meals and snacks  Increase physical activity to at least 4 days a week   Follow-Up Plan: Patient will attend a 3 month follow-up visit for diabetes self-management education.

## 2013-05-12 ENCOUNTER — Ambulatory Visit: Payer: Self-pay | Admitting: Obstetrics and Gynecology

## 2013-05-29 ENCOUNTER — Ambulatory Visit (INDEPENDENT_AMBULATORY_CARE_PROVIDER_SITE_OTHER): Payer: Medicare Other | Admitting: Internal Medicine

## 2013-05-29 ENCOUNTER — Encounter: Payer: Self-pay | Admitting: Internal Medicine

## 2013-05-29 VITALS — BP 120/80 | HR 93 | Temp 98.6°F | Resp 20 | Wt 227.0 lb

## 2013-05-29 DIAGNOSIS — Z79899 Other long term (current) drug therapy: Secondary | ICD-10-CM

## 2013-05-29 DIAGNOSIS — M359 Systemic involvement of connective tissue, unspecified: Secondary | ICD-10-CM

## 2013-05-29 DIAGNOSIS — J069 Acute upper respiratory infection, unspecified: Secondary | ICD-10-CM

## 2013-05-29 DIAGNOSIS — IMO0001 Reserved for inherently not codable concepts without codable children: Secondary | ICD-10-CM

## 2013-05-29 DIAGNOSIS — I776 Arteritis, unspecified: Secondary | ICD-10-CM

## 2013-05-29 DIAGNOSIS — M130 Polyarthritis, unspecified: Secondary | ICD-10-CM

## 2013-05-29 DIAGNOSIS — E039 Hypothyroidism, unspecified: Secondary | ICD-10-CM

## 2013-05-29 DIAGNOSIS — IMO0002 Reserved for concepts with insufficient information to code with codable children: Secondary | ICD-10-CM

## 2013-05-29 DIAGNOSIS — G8929 Other chronic pain: Secondary | ICD-10-CM

## 2013-05-29 LAB — CBC WITH DIFFERENTIAL/PLATELET
Basophils Absolute: 0 10*3/uL (ref 0.0–0.1)
Eosinophils Relative: 2.2 % (ref 0.0–5.0)
Lymphocytes Relative: 8.1 % — ABNORMAL LOW (ref 12.0–46.0)
Lymphs Abs: 0.9 10*3/uL (ref 0.7–4.0)
Monocytes Relative: 4.9 % (ref 3.0–12.0)
Neutrophils Relative %: 84.7 % — ABNORMAL HIGH (ref 43.0–77.0)
Platelets: 255 10*3/uL (ref 150.0–400.0)
RDW: 23.5 % — ABNORMAL HIGH (ref 11.5–14.6)
WBC: 10.6 10*3/uL — ABNORMAL HIGH (ref 4.5–10.5)

## 2013-05-29 LAB — HIGH SENSITIVITY CRP: CRP, High Sensitivity: 17.58 mg/L — ABNORMAL HIGH (ref 0.000–5.000)

## 2013-05-29 LAB — COMPREHENSIVE METABOLIC PANEL
ALT: 15 U/L (ref 0–35)
Albumin: 3.7 g/dL (ref 3.5–5.2)
CO2: 17 mEq/L — ABNORMAL LOW (ref 19–32)
Calcium: 9 mg/dL (ref 8.4–10.5)
Chloride: 110 mEq/L (ref 96–112)
GFR: 69.07 mL/min (ref >60.00)
Glucose, Bld: 111 mg/dL — ABNORMAL HIGH (ref 70–99)
Potassium: 4.2 mEq/L (ref 3.5–5.1)
Sodium: 138 mEq/L (ref 135–145)
Total Bilirubin: 0.3 mg/dL (ref 0.3–1.2)
Total Protein: 6.6 g/dL (ref 6.0–8.3)

## 2013-05-29 LAB — HEMOGLOBIN A1C: Hgb A1c MFr Bld: 6.4 % (ref 4.6–6.5)

## 2013-05-29 NOTE — Patient Instructions (Signed)
Acute bronchitis symptoms for less than 10 days are generally not helped by antibiotics.  Take over-the-counter expectorants and cough medications such as  Mucinex DM.  Call if there is no improvement in 5 to 7 days or if he developed worsening cough, fever, or new symptoms, such as shortness of breath or chest pain.    

## 2013-05-29 NOTE — Progress Notes (Signed)
Subjective:    [name], female    DOB: 07-08-1966, 47 y.o.   MRN: [id number]  HPI   47 year old patient who presents with a five-day history of cough which has been largely nonproductive. Associated symptoms include mild hoarseness sore throat and generalized malaise. Chronic medications include Duragesic and diclofenac. She also remains on low dose prednisone. She has a history of type 2 diabetes. Last hemoglobin A1c 6.7. She is followed by rheumatology who request followup lab  Past Medical History  Diagnosis Date  . ANXIETY 06/06/2007  . BACK PAIN 02/22/2009  . DEPRESSION 06/06/2007  . FREQUENCY, URINARY 07/07/2007  . GERD 06/06/2007  . HYPOTHYROIDISM 06/06/2007  . INTERSTITIAL CYSTITIS 09/11/2010  . NEPHROLITHIASIS, HX OF 11/05/2010  . OSTEOPENIA 10/14/2009  . OVERACTIVE BLADDER 10/14/2009  . PAIN, CHRONIC NEC 06/06/2007  . POLYARTHRITIS 08/19/2007  . 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 WEIGHT GAIN 02/22/2009  . Diabetes mellitus without complication     History   Social History  . Marital Status: Divorced    Spouse Name: N/A    Number of Children: N/A  . Years of Education: N/A   Occupational History  . Not on file.   Social History Main Topics  . Smoking status: Never Smoker   . Smokeless tobacco: Never Used  . Alcohol Use: No  . Drug Use: No  . Sexually Active: Not on file   Other Topics Concern  . Not on file   Social History Narrative  . No narrative on file    Past Surgical History  Procedure Laterality Date  . Appendectomy    . Tonsillectomy    . Wrist reconstruction    . Revision total hip arthroplasty  06-2009    Family History  Problem Relation Age of Onset  . Hypertension Mother   . Arthritis Mother   . Asthma Sister   . Asthma Daughter   . Diabetes Other   . Heart attack Other     Allergies  Allergen Reactions  . Clarithromycin   . Dhea (Nutritional Supplements)   . Fish Allergy   . Latex   . Penicillins   .  Prasterone   . Promethazine Hcl     Current Outpatient Prescriptions on File Prior to Visit  Medication Sig Dispense Refill  . albuterol (PROVENTIL HFA) 108 (90 BASE) MCG/ACT inhaler Inhale 2 puffs into the lungs every 4 (four) hours as needed.        . ARIPiprazole (ABILIFY) 10 MG tablet Take 10 mg by mouth daily.        Marland Kitchen aspirin 325 MG tablet Take 325 mg by mouth 2 (two) times daily.      Marland Kitchen BAYER MICROLET LANCETS lancets 1 each by Other route daily as needed for other. Use as instructed  100 each  12  . Cholecalciferol (VITAMIN D) 2000 UNITS CAPS Take by mouth. Daily        . CYMBALTA 60 MG capsule TAKE ONE CAPSULE BY MOUTH TWICE DAILY  60 capsule  0  . diclofenac (VOLTAREN) 75 MG EC tablet Take 1 tablet (75 mg total) by mouth 2 (two) times daily.  180 tablet  6  . fentaNYL (DURAGESIC - DOSED MCG/HR) 50 MCG/HR Place 1 patch (50 mcg total) onto the skin every 3 (three) days.  10 patch  0  . ferrous gluconate (FERGON) 325 MG tablet Take 325 mg by mouth 2 (two) times daily.        Marland Kitchen glucose blood (  BAYER CONTOUR NEXT TEST) test strip 1 each by Other route daily as needed for other. Use as instructed  100 each  12  . L-Methylfolate-B6-B12 2.8-25-2 MG TABS Take by mouth. Daily        . levothyroxine (SYNTHROID, LEVOTHROID) 25 MCG tablet TAKE ONE TABLET BY MOUTH ONE TIME DAILY  90 tablet  2  . LORazepam (ATIVAN) 1 MG tablet Take by mouth. 1 in the AM and 2 1/2 at night      . metformin (FORTAMET) 500 MG (OSM) 24 hr tablet Take 1 tablet (500 mg total) by mouth 2 (two) times daily with a meal.  180 tablet  3  . methocarbamol (ROBAXIN) 500 MG tablet Take 500 mg by mouth 3 (three) times daily.        Marland Kitchen OLANZapine (ZYPREXA) 2.5 MG tablet Take 2.5 mg by mouth every morning.        Marland Kitchen oxyCODONE-acetaminophen (PERCOCET) 10-325 MG per tablet Take 1 tablet by mouth every 6 (six) hours as needed.       . pantoprazole (PROTONIX) 40 MG tablet TAKE ONE TABLET BY MOUTH TWICE DAILY  180 tablet  4  . predniSONE  (DELTASONE) 10 MG tablet Take 10 mg by mouth daily.      . solifenacin (VESICARE) 10 MG tablet Take 0.5 tablets (5 mg total) by mouth daily.  90 tablet  6  . Teriparatide, Recombinant, (FORTEO) 600 MCG/2.4ML SOLN Inject 20 mcg into the skin every morning.        . topiramate (TOPAMAX) 100 MG tablet Take 1 tablet (100 mg total) by mouth 2 (two) times daily.  180 tablet  6  . traMADol (ULTRAM) 50 MG tablet Take 1 tablet (50 mg total) by mouth every 6 (six) hours as needed.  90 tablet  6  . valACYclovir (VALTREX) 500 MG tablet        No current facility-administered medications on file prior to visit.    BP 120/80  Pulse 93  Temp(Src) 98.6 F (37 C) (Oral)  Resp 20  Wt 227 lb (102.967 kg)  BMI 32.57 kg/m2  SpO2 96%  LMP 04/30/2013      Review of Systems  Constitutional: Positive for appetite change and fatigue. Negative for fever.  HENT: Positive for sore throat and voice change. Negative for hearing loss, congestion, rhinorrhea, dental problem, sinus pressure and tinnitus.   Eyes: Negative for pain, discharge and visual disturbance.  Respiratory: Positive for cough. Negative for shortness of breath and wheezing.   Cardiovascular: Negative for chest pain, palpitations and leg swelling.  Gastrointestinal: Negative for nausea, vomiting, abdominal pain, diarrhea, constipation, blood in stool and abdominal distention.  Genitourinary: Negative for dysuria, urgency, frequency, hematuria, flank pain, vaginal bleeding, vaginal discharge, difficulty urinating, vaginal pain and pelvic pain.  Musculoskeletal: Negative for joint swelling, arthralgias and gait problem.  Skin: Negative for rash.  Neurological: Positive for weakness. Negative for dizziness, syncope, speech difficulty, numbness and headaches.  Hematological: Negative for adenopathy.  Psychiatric/Behavioral: Negative for behavioral problems, dysphoric mood and agitation. The patient is not nervous/anxious.        Objective:    Physical Exam  Constitutional: She is oriented to person, place, and time. She appears well-developed and well-nourished.  Blood pressure 120/78 Afebrile  HENT:  Head: Normocephalic.  Right Ear: External ear normal.  Left Ear: External ear normal.  Mouth/Throat: Oropharynx is clear and moist.  Eyes: Conjunctivae and EOM are normal. Pupils are equal, round, and reactive to light.  Neck:  Normal range of motion. Neck supple. No thyromegaly present.  Cardiovascular: Normal rate, regular rhythm, normal heart sounds and intact distal pulses.   Pulmonary/Chest: Effort normal and breath sounds normal. No respiratory distress. She has no wheezes. She has no rales.  O2 saturation 96  Abdominal: Soft. Bowel sounds are normal. She exhibits no mass. There is no tenderness.  Musculoskeletal: Normal range of motion.  Lymphadenopathy:    She has no cervical adenopathy.  Neurological: She is alert and oriented to person, place, and time.  Skin: Skin is warm and dry. No rash noted.  Psychiatric: She has a normal mood and affect. Her behavior is normal.          Assessment & Plan:   Viral URI with cough.  Chronic medications include Duragesic and diclofenac. Will suggest to and Mucinex DM and Tylenol as needed Polyarthritis. We'll update labs Type 2 diabetes. Check hemoglobin A1c  Recheck 3 months

## 2013-05-30 LAB — ANGIOTENSIN CONVERTING ENZYME: Angiotensin-Converting Enzyme: 15 U/L (ref 8–52)

## 2013-06-14 ENCOUNTER — Ambulatory Visit (INDEPENDENT_AMBULATORY_CARE_PROVIDER_SITE_OTHER): Payer: Medicare Other | Admitting: Family Medicine

## 2013-06-14 ENCOUNTER — Encounter: Payer: Self-pay | Admitting: Family Medicine

## 2013-06-14 VITALS — BP 124/62 | HR 76 | Temp 98.3°F | Wt 225.0 lb

## 2013-06-14 DIAGNOSIS — J209 Acute bronchitis, unspecified: Secondary | ICD-10-CM | POA: Diagnosis not present

## 2013-06-14 MED ORDER — LEVOFLOXACIN 500 MG PO TABS: 500.0000 mg | ORAL_TABLET | Freq: Every day | ORAL | Status: DC

## 2013-06-14 MED ORDER — BENZONATATE 200 MG PO CAPS: 200.0000 mg | ORAL_CAPSULE | Freq: Three times a day (TID) | ORAL | Status: DC | PRN

## 2013-06-14 NOTE — Progress Notes (Signed)
  Subjective:    [name], female    DOB: Jan 08, 1966, 47 y.o.   MRN: [id number]  HPI Acute visit Patient has multiple chronic problems including history of lupus, interstitial cystitis, hypothyroidism, type 2 diabetes, and GERD. She also relates history of mild asthma but does not take any regular medications for that. Seen with 3 week history of cough productive of green sputum. She's tried Mucinex DM without relief. She takes low-dose prednisone 10 mg daily for her lupus. Nonsmoker. No significant dyspnea. No hemoptysis. No postnasal drip symptoms. No acute or chronic sinusitis symptoms. Not aware of any wheezing. Allergy to Biaxin and penicillin.  Past Medical History  Diagnosis Date  . ANXIETY 06/06/2007  . BACK PAIN 02/22/2009  . DEPRESSION 06/06/2007  . FREQUENCY, URINARY 07/07/2007  . GERD 06/06/2007  . HYPOTHYROIDISM 06/06/2007  . INTERSTITIAL CYSTITIS 09/11/2010  . NEPHROLITHIASIS, HX OF 11/05/2010  . OSTEOPENIA 10/14/2009  . OVERACTIVE BLADDER 10/14/2009  . PAIN, CHRONIC NEC 06/06/2007  . POLYARTHRITIS 08/19/2007  . 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 WEIGHT GAIN 02/22/2009  . Diabetes mellitus without complication    Past Surgical History  Procedure Laterality Date  . Appendectomy    . Tonsillectomy    . Wrist reconstruction    . Revision total hip arthroplasty  06-2009    reports that she has never smoked. She has never used smokeless tobacco. She reports that she does not drink alcohol or use illicit drugs. family history includes Arthritis in her mother; Asthma in her daughter and sister; Diabetes in her other; Heart attack in her other; and Hypertension in her mother. Allergies  Allergen Reactions  . Clarithromycin   . Dhea (Nutritional Supplements)   . Fish Allergy   . Latex   . Penicillins   . Prasterone   . Promethazine Hcl       Review of Systems  Constitutional: Positive for fatigue. Negative for fever and chills.  HENT: Negative for  congestion.   Respiratory: Positive for cough. Negative for shortness of breath and wheezing.        Objective:   Physical Exam  Constitutional: She appears well-developed and well-nourished. No distress.  HENT:  Right Ear: External ear normal.  Left Ear: External ear normal.  Mouth/Throat: Oropharynx is clear and moist.  Neck: Neck supple. No thyromegaly present.  Cardiovascular: Normal rate.   Pulmonary/Chest: Effort normal and breath sounds normal. No respiratory distress. She has no wheezes. She has no rales.          Assessment & Plan:  Productive cough. Patient immunosuppressed with chronic prednisone. Given duration productive cough start Levaquin 500 mg once daily for 7 days. Tessalon Perles 200 mg every 8 hours prn for cough

## 2013-06-14 NOTE — Patient Instructions (Addendum)
Follow up for any fever or persistent cough. 

## 2013-06-27 ENCOUNTER — Telehealth: Payer: Self-pay | Admitting: Internal Medicine

## 2013-06-27 MED ORDER — FLUCONAZOLE 150 MG PO TABS: 150.0000 mg | ORAL_TABLET | Freq: Once | ORAL | Status: DC

## 2013-06-27 NOTE — Telephone Encounter (Signed)
Spoke to pt told her Rx for Diflucan 150 mg tablets x 2 sent to pharmacy. Repeat 2nd dose in 3 days. Pt verbalized understanding.

## 2013-06-27 NOTE — Telephone Encounter (Signed)
Patient Information:  Caller Name: Aanyah  Phone: [phone]  Patient: [name] [name], [name] [name]  Gender: Female  DOB: 03/30/1966  Age: 47 Years  PCP: Eleonore Chiquito [facility])  Pregnant: No  Office Follow Up:  Does the office need to follow up with this patient?: Yes  Instructions For The Office: Requests Rx for diflucan krs/can  RN Note:  Patient was placed on antibiotics for bronchitis 06/14/13. Finished the antibiotics, and is better, but noted onset of white discharge and  vaginal itchine 06/26/13.  Afebrile.  Per vaginal discharge protocol, emergent symptoms denied; advised and offered appt within 72 hours.  Patient declines appt; states would like Rx for diflucan called in instead.  Info to office for provider review/Rx/callback.  Uses [facility].  May reach patient at [phone].  krs/can  Symptoms  Reason For Call & Symptoms: symptoms of yeast infection;  Reviewed Health History In EMR: Yes  Reviewed Medications In EMR: Yes  Reviewed Allergies In EMR: Yes  Reviewed Surgeries / Procedures: Yes  Date of Onset of Symptoms: 06/26/2013 OB / GYN:  LMP: 05/27/2013  Guideline(s) Used:  Vaginal Discharge  Disposition Per Guideline:   See Within 3 Days in Office  Reason For Disposition Reached:   Symptoms of a yeast infection" (i.e., itchy, white discharge, not bad smelling) and not improved > 3 days following Care Advice  Advice Given:  N/A  Patient Refused Recommendation:  Patient Requests Prescription  Requests Rx for diflucan krs/can

## 2013-06-27 NOTE — Telephone Encounter (Signed)
Okay to call in Diflucan 100 mg  #2

## 2013-08-03 ENCOUNTER — Encounter: Payer: Self-pay | Admitting: Internal Medicine

## 2013-08-03 ENCOUNTER — Ambulatory Visit (INDEPENDENT_AMBULATORY_CARE_PROVIDER_SITE_OTHER): Payer: Medicare Other | Admitting: Internal Medicine

## 2013-08-03 VITALS — BP 120/70 | HR 87 | Temp 98.5°F | Resp 20 | Wt 220.0 lb

## 2013-08-03 DIAGNOSIS — N3010 Interstitial cystitis (chronic) without hematuria: Secondary | ICD-10-CM

## 2013-08-03 DIAGNOSIS — M130 Polyarthritis, unspecified: Secondary | ICD-10-CM

## 2013-08-03 DIAGNOSIS — E039 Hypothyroidism, unspecified: Secondary | ICD-10-CM

## 2013-08-03 DIAGNOSIS — G8929 Other chronic pain: Secondary | ICD-10-CM

## 2013-08-03 MED ORDER — METHYLPREDNISOLONE ACETATE 80 MG/ML IJ SUSP
80.0000 mg | Freq: Once | INTRAMUSCULAR | Status: AC
  Administered 2013-08-03: 80 mg via INTRAMUSCULAR

## 2013-08-03 NOTE — Addendum Note (Signed)
Addended by: Jimmye Norman on: 08/03/2013 05:15 PM   Modules accepted: Orders

## 2013-08-03 NOTE — Patient Instructions (Addendum)
Return in 6 months for follow-up or as needed  Rheumatology followup as scheduled

## 2013-08-03 NOTE — Progress Notes (Signed)
Subjective:    [name], female    DOB: 12/25/1965, 47 y.o.   MRN: [id number]  HPI  47 year old patient who is followed by rheumatology for a poor defined polyarthritis. She is on maintenance prednisone 10 mg daily as well as methotrexate and dapsone. She is on multiple medications for pain control. For the past 3 days she complains of a lupus flare with increased pain in both knees and in the small joints of the hands.  She states pain is maximal in the left knee and also quite bothersome involving the MCP and PIP joints of both hands. She states that she does usually respond to a prednisone boost.  Past Medical History  Diagnosis Date  . ANXIETY 06/06/2007  . BACK PAIN 02/22/2009  . DEPRESSION 06/06/2007  . FREQUENCY, URINARY 07/07/2007  . GERD 06/06/2007  . HYPOTHYROIDISM 06/06/2007  . INTERSTITIAL CYSTITIS 09/11/2010  . NEPHROLITHIASIS, HX OF 11/05/2010  . OSTEOPENIA 10/14/2009  . OVERACTIVE BLADDER 10/14/2009  . PAIN, CHRONIC NEC 06/06/2007  . POLYARTHRITIS 08/19/2007  . 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 WEIGHT GAIN 02/22/2009  . Diabetes mellitus without complication     History   Social History  . Marital Status: Divorced    Spouse Name: N/A    Number of Children: N/A  . Years of Education: N/A   Occupational History  . Not on file.   Social History Main Topics  . Smoking status: Never Smoker   . Smokeless tobacco: Never Used  . Alcohol Use: No  . Drug Use: No  . Sexual Activity: Not on file   Other Topics Concern  . Not on file   Social History Narrative  . No narrative on file    Past Surgical History  Procedure Laterality Date  . Appendectomy    . Tonsillectomy    . Wrist reconstruction    . Revision total hip arthroplasty  06-2009    Family History  Problem Relation Age of Onset  . Hypertension Mother   . Arthritis Mother   . Asthma Sister   . Asthma Daughter   . Diabetes Other   . Heart attack Other     Allergies   Allergen Reactions  . Clarithromycin   . Dhea [Nutritional Supplements]   . Fish Allergy   . Latex   . Penicillins   . Prasterone   . Promethazine Hcl     Current Outpatient Prescriptions on File Prior to Visit  Medication Sig Dispense Refill  . albuterol (PROVENTIL HFA) 108 (90 BASE) MCG/ACT inhaler Inhale 2 puffs into the lungs every 4 (four) hours as needed.        . ARIPiprazole (ABILIFY) 10 MG tablet Take 10 mg by mouth daily.        Marland Kitchen aspirin 325 MG tablet Take 325 mg by mouth 2 (two) times daily.      Marland Kitchen BAYER MICROLET LANCETS lancets 1 each by Other route daily as needed for other. Use as instructed  100 each  12  . benzonatate (TESSALON) 200 MG capsule Take 1 capsule (200 mg total) by mouth 3 (three) times daily as needed for cough.  30 capsule  0  . Cholecalciferol (VITAMIN D) 2000 UNITS CAPS Take by mouth. Daily        . CYMBALTA 60 MG capsule TAKE ONE CAPSULE BY MOUTH TWICE DAILY  60 capsule  0  . dapsone 25 MG tablet Take 25 mg by mouth daily.      Marland Kitchen  diclofenac (VOLTAREN) 75 MG EC tablet Take 1 tablet (75 mg total) by mouth 2 (two) times daily.  180 tablet  6  . fentaNYL (DURAGESIC - DOSED MCG/HR) 50 MCG/HR Place 1 patch (50 mcg total) onto the skin every 3 (three) days.  10 patch  0  . ferrous gluconate (FERGON) 325 MG tablet Take 325 mg by mouth 2 (two) times daily.        . fluconazole (DIFLUCAN) 150 MG tablet Take 1 tablet (150 mg total) by mouth once.  2 tablet  0  . glucose blood (BAYER CONTOUR NEXT TEST) test strip 1 each by Other route daily as needed for other. Use as instructed  100 each  12  . L-Methylfolate-B6-B12 2.8-25-2 MG TABS Take by mouth. Daily        . levofloxacin (LEVAQUIN) 500 MG tablet Take 1 tablet (500 mg total) by mouth daily.  7 tablet  0  . levothyroxine (SYNTHROID, LEVOTHROID) 25 MCG tablet TAKE ONE TABLET BY MOUTH ONE TIME DAILY  90 tablet  2  . LORazepam (ATIVAN) 1 MG tablet Take by mouth. 1 in the AM and 2 1/2 at night      . metformin  (FORTAMET) 500 MG (OSM) 24 hr tablet Take 1 tablet (500 mg total) by mouth 2 (two) times daily with a meal.  180 tablet  3  . methocarbamol (ROBAXIN) 500 MG tablet Take 500 mg by mouth 3 (three) times daily.        Marland Kitchen OLANZapine (ZYPREXA) 2.5 MG tablet Take 2.5 mg by mouth every morning.        Marland Kitchen oxyCODONE-acetaminophen (PERCOCET) 10-325 MG per tablet Take 1 tablet by mouth every 6 (six) hours as needed.       . pantoprazole (PROTONIX) 40 MG tablet TAKE ONE TABLET BY MOUTH TWICE DAILY  180 tablet  4  . predniSONE (DELTASONE) 10 MG tablet Take 10 mg by mouth daily.      . solifenacin (VESICARE) 10 MG tablet Take 0.5 tablets (5 mg total) by mouth daily.  90 tablet  6  . Teriparatide, Recombinant, (FORTEO) 600 MCG/2.4ML SOLN Inject 20 mcg into the skin every morning.        . topiramate (TOPAMAX) 100 MG tablet Take 1 tablet (100 mg total) by mouth 2 (two) times daily.  180 tablet  6  . traMADol (ULTRAM) 50 MG tablet Take 1 tablet (50 mg total) by mouth every 6 (six) hours as needed.  90 tablet  6  . valACYclovir (VALTREX) 500 MG tablet        No current facility-administered medications on file prior to visit.    BP 120/70  Pulse 87  Temp(Src) 98.5 F (36.9 C) (Oral)  Resp 20  Wt 220 lb (99.791 kg)  BMI 31.57 kg/m2  SpO2 96%       Review of Systems  Musculoskeletal: Positive for back pain and arthralgias.       Objective:   Physical Exam  Constitutional: She is oriented to person, place, and time. She appears well-developed and well-nourished.  HENT:  Head: Normocephalic.  Right Ear: External ear normal.  Left Ear: External ear normal.  Mouth/Throat: Oropharynx is clear and moist.  Eyes: Conjunctivae and EOM are normal. Pupils are equal, round, and reactive to light.  Neck: Normal range of motion. Neck supple. No thyromegaly present.  Cardiovascular: Normal rate, regular rhythm, normal heart sounds and intact distal pulses.   Pulmonary/Chest: Effort normal and breath sounds  normal.  Abdominal: Soft. Bowel sounds are normal. She exhibits no mass. There is no tenderness.  Musculoskeletal: Normal range of motion. She exhibits tenderness.  No active synovitis except for a mild tenderness of the left knee and subjective tenderness of the MCP joints of the hands. No excess of warmth or soft tissue swelling  Lymphadenopathy:    She has no cervical adenopathy.  Neurological: She is alert and oriented to person, place, and time.  Skin: Skin is warm and dry. No rash noted.  Psychiatric: She has a normal mood and affect. Her behavior is normal.          Assessment & Plan:   Polyarthritis flare Chronic pain syndrome History of anxiety depression  Will treat with Depo-Medrol 80. She is scheduled to see rheumatology in followup on September 24. Return here when necessary

## 2013-08-22 LAB — HM DIABETES EYE EXAM

## 2013-08-27 ENCOUNTER — Emergency Department (HOSPITAL_COMMUNITY): Payer: Medicare Other

## 2013-08-27 ENCOUNTER — Emergency Department (HOSPITAL_COMMUNITY)
Admission: EM | Admit: 2013-08-27 | Discharge: 2013-08-28 | Disposition: A | Discharge status: Home / Self Care | Payer: Medicare Other | Attending: Emergency Medicine | Admitting: Emergency Medicine

## 2013-08-27 ENCOUNTER — Encounter (HOSPITAL_COMMUNITY): Payer: Self-pay | Admitting: *Deleted

## 2013-08-27 DIAGNOSIS — F411 Generalized anxiety disorder: Secondary | ICD-10-CM | POA: Insufficient documentation

## 2013-08-27 DIAGNOSIS — R110 Nausea: Secondary | ICD-10-CM | POA: Insufficient documentation

## 2013-08-27 DIAGNOSIS — Z7982 Long term (current) use of aspirin: Secondary | ICD-10-CM | POA: Insufficient documentation

## 2013-08-27 DIAGNOSIS — E119 Type 2 diabetes mellitus without complications: Secondary | ICD-10-CM | POA: Insufficient documentation

## 2013-08-27 DIAGNOSIS — K219 Gastro-esophageal reflux disease without esophagitis: Secondary | ICD-10-CM | POA: Insufficient documentation

## 2013-08-27 DIAGNOSIS — R509 Fever, unspecified: Secondary | ICD-10-CM | POA: Insufficient documentation

## 2013-08-27 DIAGNOSIS — Z8739 Personal history of other diseases of the musculoskeletal system and connective tissue: Secondary | ICD-10-CM | POA: Insufficient documentation

## 2013-08-27 DIAGNOSIS — Z791 Long term (current) use of non-steroidal anti-inflammatories (NSAID): Secondary | ICD-10-CM | POA: Insufficient documentation

## 2013-08-27 DIAGNOSIS — G8929 Other chronic pain: Secondary | ICD-10-CM | POA: Insufficient documentation

## 2013-08-27 DIAGNOSIS — M130 Polyarthritis, unspecified: Secondary | ICD-10-CM | POA: Insufficient documentation

## 2013-08-27 DIAGNOSIS — F3289 Other specified depressive episodes: Secondary | ICD-10-CM | POA: Insufficient documentation

## 2013-08-27 DIAGNOSIS — Z8669 Personal history of other diseases of the nervous system and sense organs: Secondary | ICD-10-CM | POA: Insufficient documentation

## 2013-08-27 DIAGNOSIS — Z91040 Latex allergy status: Secondary | ICD-10-CM | POA: Insufficient documentation

## 2013-08-27 DIAGNOSIS — R1013 Epigastric pain: Secondary | ICD-10-CM | POA: Insufficient documentation

## 2013-08-27 DIAGNOSIS — E039 Hypothyroidism, unspecified: Secondary | ICD-10-CM | POA: Insufficient documentation

## 2013-08-27 DIAGNOSIS — Z3202 Encounter for pregnancy test, result negative: Secondary | ICD-10-CM | POA: Insufficient documentation

## 2013-08-27 DIAGNOSIS — Z79899 Other long term (current) drug therapy: Secondary | ICD-10-CM | POA: Insufficient documentation

## 2013-08-27 DIAGNOSIS — Z880 Allergy status to penicillin: Secondary | ICD-10-CM | POA: Insufficient documentation

## 2013-08-27 DIAGNOSIS — F329 Major depressive disorder, single episode, unspecified: Secondary | ICD-10-CM | POA: Insufficient documentation

## 2013-08-27 DIAGNOSIS — IMO0002 Reserved for concepts with insufficient information to code with codable children: Secondary | ICD-10-CM | POA: Insufficient documentation

## 2013-08-27 DIAGNOSIS — Z87442 Personal history of urinary calculi: Secondary | ICD-10-CM | POA: Insufficient documentation

## 2013-08-27 HISTORY — DX: Systemic lupus erythematosus, unspecified: M32.9

## 2013-08-27 LAB — URINALYSIS, ROUTINE W REFLEX MICROSCOPIC
Hgb urine dipstick: NEGATIVE
Nitrite: NEGATIVE
Protein, ur: NEGATIVE mg/dL
Specific Gravity, Urine: 1.022
Urobilinogen, UA: 0.2 mg/dL

## 2013-08-27 LAB — COMPREHENSIVE METABOLIC PANEL
CO2: 21 mEq/L (ref 19–32)
Calcium: 8.8 mg/dL (ref 8.4–10.5)
Creatinine, Ser: 0.72 mg/dL (ref 0.50–1.10)
GFR calc Af Amer: >90 mL/min (ref >90)
GFR calc non Af Amer: >90 mL/min (ref >90)
Glucose, Bld: 161 mg/dL — ABNORMAL HIGH (ref 70–99)
Sodium: 139 mEq/L (ref 135–145)
Total Protein: 6.7 g/dL (ref 6.0–8.3)

## 2013-08-27 LAB — CBC WITH DIFFERENTIAL/PLATELET
HCT: 40.3 % (ref 36.0–46.0)
Hemoglobin: 12.9 g/dL (ref 12.0–15.0)
Lymphocytes Relative: 12 % (ref 12–46)
Lymphs Abs: 1.1 10*3/uL (ref 0.7–4.0)
Monocytes Absolute: 0.4 10*3/uL (ref 0.1–1.0)
Monocytes Relative: 4 % (ref 3–12)
Neutro Abs: 7.7 10*3/uL (ref 1.7–7.7)
Neutrophils Relative %: 84 % — ABNORMAL HIGH (ref 43–77)
RBC: 4.79 MIL/uL (ref 3.87–5.11)

## 2013-08-27 LAB — LIPASE, BLOOD: Lipase: 45 U/L (ref 11–59)

## 2013-08-27 LAB — URINE MICROSCOPIC-ADD ON

## 2013-08-27 LAB — GLUCOSE, CAPILLARY

## 2013-08-27 LAB — POCT I-STAT TROPONIN I

## 2013-08-27 MED ORDER — MORPHINE SULFATE 4 MG/ML IJ SOLN
4.0000 mg | Freq: Once | INTRAMUSCULAR | Status: AC
  Administered 2013-08-27: 4 mg via INTRAVENOUS
  Filled 2013-08-27: qty 1

## 2013-08-27 MED ORDER — ONDANSETRON 4 MG PO TBDP: 4.0000 mg | ORAL_TABLET | Freq: Once | ORAL | Status: DC

## 2013-08-27 MED ORDER — ONDANSETRON HCL 4 MG/2ML IJ SOLN: 4.0000 mg | Freq: Once | INTRAMUSCULAR | Status: DC

## 2013-08-27 MED ORDER — SODIUM CHLORIDE 0.9 % IV BOLUS (SEPSIS)
1000.0000 mL | Freq: Once | INTRAVENOUS | Status: AC
  Administered 2013-08-27: 1000 mL via INTRAVENOUS

## 2013-08-27 MED ORDER — MORPHINE SULFATE 4 MG/ML IJ SOLN: 6.0000 mg | Freq: Once | INTRAMUSCULAR | Status: DC

## 2013-08-27 MED ORDER — MORPHINE SULFATE 4 MG/ML IJ SOLN
6.0000 mg | Freq: Once | INTRAMUSCULAR | Status: AC
  Administered 2013-08-27: 6 mg via INTRAVENOUS
  Filled 2013-08-27: qty 2

## 2013-08-27 MED ORDER — OXYCODONE-ACETAMINOPHEN 5-325 MG PO TABS: ORAL_TABLET | ORAL | Status: DC

## 2013-08-27 MED ORDER — ONDANSETRON HCL 4 MG/2ML IJ SOLN
4.0000 mg | Freq: Once | INTRAMUSCULAR | Status: AC
  Administered 2013-08-27: 4 mg via INTRAVENOUS
  Filled 2013-08-27: qty 2

## 2013-08-27 MED ORDER — METOCLOPRAMIDE HCL 10 MG PO TABS: 10.0000 mg | ORAL_TABLET | Freq: Four times a day (QID) | ORAL | Status: DC | PRN

## 2013-08-27 MED ORDER — IOHEXOL 300 MG/ML  SOLN
25.0000 mL | INTRAMUSCULAR | Status: AC
  Administered 2013-08-27: 25 mL via ORAL

## 2013-08-27 MED ORDER — HYDROMORPHONE HCL PF 1 MG/ML IJ SOLN
1.0000 mg | Freq: Once | INTRAMUSCULAR | Status: DC
  Filled 2013-08-27: qty 1

## 2013-08-27 MED ORDER — IOHEXOL 300 MG/ML  SOLN
100.0000 mL | Freq: Once | INTRAMUSCULAR | Status: AC | PRN
  Administered 2013-08-27: 100 mL via INTRAVENOUS

## 2013-08-27 NOTE — ED Notes (Signed)
Pt reports sharp mid abd pains since wed, having nausea and unable to take her lupus and DM meds. Denies vomiting or diarrhea. Pt diaphoretic at triage.

## 2013-08-27 NOTE — ED Provider Notes (Signed)
CSN: [id number]     Arrival date & time 08/27/13  1850 History   First MD Initiated Contact with Patient 08/27/13 1902     Chief Complaint  Patient presents with  . Abdominal Pain   (Consider location/radiation/quality/duration/timing/severity/associated sxs/prior Treatment) HPI  [name] [name] is a 47 y.o. female past medical history significant for lupus complaining of severe epigastric pain and nausea, exacerbated by eating. Patient reports subjective fever and chills. She denies chest pain, shortness of breath, melena, hematochezia, emesis. She is unable to take her home medications. She takes prednisone and voltaren. No other NSAIDs, she's not a heavy alcohol drinker. She is status post cholecystectomy and appendectomy, has never had a SBO. Patient is a bowel movement in 48 hours but she is passing flatus.  Past Medical History  Diagnosis Date  . ANXIETY 06/06/2007  . BACK PAIN 02/22/2009  . DEPRESSION 06/06/2007  . FREQUENCY, URINARY 07/07/2007  . GERD 06/06/2007  . HYPOTHYROIDISM 06/06/2007  . INTERSTITIAL CYSTITIS 09/11/2010  . NEPHROLITHIASIS, HX OF 11/05/2010  . OSTEOPENIA 10/14/2009  . OVERACTIVE BLADDER 10/14/2009  . PAIN, CHRONIC NEC 06/06/2007  . POLYARTHRITIS 08/19/2007  . 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 WEIGHT GAIN 02/22/2009  . Diabetes mellitus without complication   . Lupus    Past Surgical History  Procedure Laterality Date  . Appendectomy    . Tonsillectomy    . Wrist reconstruction    . Revision total hip arthroplasty  06-2009   Family History  Problem Relation Age of Onset  . Hypertension Mother   . Arthritis Mother   . Asthma Sister   . Asthma Daughter   . Diabetes Other   . Heart attack Other    History  Substance Use Topics  . Smoking status: Never Smoker   . Smokeless tobacco: Never Used  . Alcohol Use: No   OB History   Grav Para Term Preterm Abortions TAB SAB Ect Mult Living                 Review of Systems 10 systems reviewed and  found to be negative, except as noted in the HPI   Allergies  Clarithromycin; Dhea; Fish allergy; Latex; Penicillins; Prasterone; and Promethazine hcl  Home Medications   Current Outpatient Rx  Name  Route  Sig  Dispense  Refill  . albuterol (PROVENTIL HFA) 108 (90 BASE) MCG/ACT inhaler   Inhalation   Inhale 2 puffs into the lungs every 4 (four) hours as needed.           . ARIPiprazole (ABILIFY) 10 MG tablet   Oral   Take 10 mg by mouth daily.           Marland Kitchen aspirin 325 MG tablet   Oral   Take 325 mg by mouth 2 (two) times daily.         Marland Kitchen BAYER MICROLET LANCETS lancets   Other   1 each by Other route daily as needed for other. Use as instructed   100 each   12     Dx: 250.00   . benzonatate (TESSALON) 200 MG capsule   Oral   Take 1 capsule (200 mg total) by mouth 3 (three) times daily as needed for cough.   30 capsule   0   . Cholecalciferol (VITAMIN D) 2000 UNITS CAPS   Oral   Take by mouth. Daily           . CYMBALTA 60 MG capsule  TAKE ONE CAPSULE BY MOUTH TWICE DAILY   60 capsule   0   . dapsone 25 MG tablet   Oral   Take 25 mg by mouth daily.         . diclofenac (VOLTAREN) 75 MG EC tablet   Oral   Take 1 tablet (75 mg total) by mouth 2 (two) times daily.   180 tablet   6   . fentaNYL (DURAGESIC - DOSED MCG/HR) 50 MCG/HR   Transdermal   Place 1 patch (50 mcg total) onto the skin every 3 (three) days.   10 patch   0   . ferrous gluconate (FERGON) 325 MG tablet   Oral   Take 325 mg by mouth 2 (two) times daily.           . fluconazole (DIFLUCAN) 150 MG tablet   Oral   Take 1 tablet (150 mg total) by mouth once.   2 tablet   0     Repeat in 3 days.   Marland Kitchen glucose blood (BAYER CONTOUR NEXT TEST) test strip   Other   1 each by Other route daily as needed for other. Use as instructed   100 each   12     Dx: 250.00   . L-Methylfolate-B6-B12 2.8-25-2 MG TABS   Oral   Take by mouth. Daily           . levofloxacin  (LEVAQUIN) 500 MG tablet   Oral   Take 1 tablet (500 mg total) by mouth daily.   7 tablet   0   . levothyroxine (SYNTHROID, LEVOTHROID) 25 MCG tablet      TAKE ONE TABLET BY MOUTH ONE TIME DAILY   90 tablet   2   . LORazepam (ATIVAN) 1 MG tablet   Oral   Take by mouth. 1 in the AM and 2 1/2 at night         . metformin (FORTAMET) 500 MG (OSM) 24 hr tablet   Oral   Take 1 tablet (500 mg total) by mouth 2 (two) times daily with a meal.   180 tablet   3   . methocarbamol (ROBAXIN) 500 MG tablet   Oral   Take 500 mg by mouth 3 (three) times daily.           Marland Kitchen OLANZapine (ZYPREXA) 2.5 MG tablet   Oral   Take 2.5 mg by mouth every morning.           Marland Kitchen oxyCODONE-acetaminophen (PERCOCET) 10-325 MG per tablet   Oral   Take 1 tablet by mouth every 6 (six) hours as needed.          . pantoprazole (PROTONIX) 40 MG tablet      TAKE ONE TABLET BY MOUTH TWICE DAILY   180 tablet   4   . predniSONE (DELTASONE) 10 MG tablet   Oral   Take 10 mg by mouth daily.         . solifenacin (VESICARE) 10 MG tablet   Oral   Take 0.5 tablets (5 mg total) by mouth daily.   90 tablet   6   . Teriparatide, Recombinant, (FORTEO) 600 MCG/2.4ML SOLN   Subcutaneous   Inject 20 mcg into the skin every morning.           . topiramate (TOPAMAX) 100 MG tablet   Oral   Take 1 tablet (100 mg total) by mouth 2 (two) times daily.   180 tablet  6   . traMADol (ULTRAM) 50 MG tablet   Oral   Take 1 tablet (50 mg total) by mouth every 6 (six) hours as needed.   90 tablet   6   . valACYclovir (VALTREX) 500 MG tablet                BP 137/75  Pulse 110  Temp(Src) 98.2 F (36.8 C) (Oral)  Resp 20  SpO2 94% Physical Exam  Nursing note and vitals reviewed. Constitutional: She is oriented to person, place, and time. She appears well-developed and well-nourished.  HENT:  Head: Normocephalic.  Eyes: Conjunctivae and EOM are normal.  Cardiovascular: Normal rate, regular rhythm  and intact distal pulses.   Pulmonary/Chest: Effort normal and breath sounds normal. No stridor. No respiratory distress. She has no wheezes. She has no rales. She exhibits no tenderness.  Abdominal: Soft. Bowel sounds are normal. She exhibits no distension and no mass. There is tenderness. There is no rebound and no guarding.  Exquisitely tender to palpation in the epigastric and bilateral upper quadrants. There is no guarding or rebound, bowel sounds are normal.  Musculoskeletal: Normal range of motion.  Left arm in splint, neurovascularly intact  Neurological: She is alert and oriented to person, place, and time.  Skin: She is diaphoretic.  Psychiatric: She has a normal mood and affect.    ED Course  Procedures (including critical care time) Labs Review Labs Reviewed  URINALYSIS, ROUTINE W REFLEX MICROSCOPIC - Abnormal; Notable for the following:    APPearance CLOUDY (*)    Leukocytes, UA TRACE (*)    All other components within normal limits  COMPREHENSIVE METABOLIC PANEL - Abnormal; Notable for the following:    Potassium 3.3 (*)    Glucose, Bld 161 (*)    All other components within normal limits  CBC WITH DIFFERENTIAL - Abnormal; Notable for the following:    RDW 18.7 (*)    Neutrophils Relative % 84 (*)    All other components within normal limits  GLUCOSE, CAPILLARY - Abnormal; Notable for the following:    Glucose-Capillary 139 (*)    All other components within normal limits  URINE MICROSCOPIC-ADD ON - Abnormal; Notable for the following:    Squamous Epithelial / LPF MANY (*)    Bacteria, UA FEW (*)    All other components within normal limits  LIPASE, BLOOD  POCT I-STAT TROPONIN I  OCCULT BLOOD, POC DEVICE  POCT PREGNANCY, URINE   Imaging Review Ct Abdomen Pelvis W Contrast  08/27/2013   CLINICAL DATA:  Mid abdominal pain, nausea.  EXAM: CT ABDOMEN AND PELVIS WITH CONTRAST  TECHNIQUE: Multidetector CT imaging of the abdomen and pelvis was performed using the  standard protocol following bolus administration of intravenous contrast.  CONTRAST:  OMNIPAQUE IOHEXOL 300 MG/ML  SOLN  COMPARISON:  07/02/2009  FINDINGS: Dependent atelectasis in the lung bases. No effusions. Heart is normal size.  Prior cholecystectomy. Liver, spleen, pancreas, adrenals and kidneys are unremarkable. Small cyst in the lower pole of the right kidney. No hydronephrosis.  Uterus and right ovary are unremarkable. 2.7 cm cyst in knee left ovary. Trace free fluid in the pelvis. Urinary bladder is unremarkable. Large and small bowel are grossly unremarkable. Aorta is normal caliber.  No acute bony abnormality.  IMPRESSION: Small left ovarian cyst. Trace free fluid in the pelvis.   Electronically Signed   By: Charlett Nose M.D.   On: 08/27/2013 23:44    9:26 PM  patient reports  that her pain is still severe, exacerbated by drinking the contrast, however, she appears much more calm is no longer diaphoretic. Abdominal exam remains nonsurgical with tenderness to palpation in the epigastrium.  MDM   1. Epigastric abdominal pain     Filed Vitals:   08/27/13 1854  BP: 137/75  Pulse: 110  Temp: 98.2 F (36.8 C)  TempSrc: Oral  Resp: 20  SpO2: 94%     [name] [name] is a 47 y.o. female with severe epigastric abd pain and nausea. Abd exam is nonsurgical, normal bowel sounds. Serial exams remain benign. Blood work urinalysis and CT are benign. Pain is well-controlled in the ED. Do not think there were any emergent conditions that would require intervention or admission at this time. Patient seemed reliable for followup and has outpatient care. We have had discussion of return precautions.Discussed case with attending who agrees with plan and stability to d/c to home.   Medications  iohexol (OMNIPAQUE) 300 MG/ML solution 25 mL (25 mLs Oral Contrast Given 08/27/13 2100)  sodium chloride 0.9 % bolus 1,000 mL (0 mLs Intravenous Stopped 08/27/13 2157)  morphine 4 MG/ML injection 6 mg  (6 mg Intravenous Given 08/27/13 2024)  ondansetron (ZOFRAN) injection 4 mg (4 mg Intravenous Given 08/27/13 2024)  morphine 4 MG/ML injection 4 mg (4 mg Intravenous Given 08/27/13 2158)  ondansetron (ZOFRAN) injection 4 mg (4 mg Intravenous Given 08/27/13 2241)  iohexol (OMNIPAQUE) 300 MG/ML solution 100 mL (100 mLs Intravenous Contrast Given 08/27/13 2310)    Pt is hemodynamically stable, appropriate for, and amenable to discharge at this time. Pt verbalized understanding and agrees with care plan. All questions answered. Outpatient follow-up and specific return precautions discussed.    New Prescriptions   METOCLOPRAMIDE (REGLAN) 10 MG TABLET    Take 1 tablet (10 mg total) by mouth every 6 (six) hours as needed (nausea/headache).   OXYCODONE-ACETAMINOPHEN (PERCOCET/ROXICET) 5-325 MG PER TABLET    1 to 2 tabs PO q6hrs  PRN for pain    Note: Portions of this report may have been transcribed using voice recognition software. Every effort was made to ensure accuracy; however, inadvertent computerized transcription errors may be present      Wynetta Emery, PA-C 08/28/13 1610

## 2013-08-29 ENCOUNTER — Encounter: Payer: Self-pay | Admitting: Internal Medicine

## 2013-08-31 ENCOUNTER — Encounter: Payer: Self-pay | Admitting: Internal Medicine

## 2013-08-31 ENCOUNTER — Ambulatory Visit (INDEPENDENT_AMBULATORY_CARE_PROVIDER_SITE_OTHER): Payer: Medicare Other | Admitting: Internal Medicine

## 2013-08-31 VITALS — BP 120/80 | HR 87 | Temp 98.3°F | Resp 20 | Wt 210.0 lb

## 2013-08-31 DIAGNOSIS — F411 Generalized anxiety disorder: Secondary | ICD-10-CM

## 2013-08-31 DIAGNOSIS — IMO0001 Reserved for inherently not codable concepts without codable children: Secondary | ICD-10-CM

## 2013-08-31 DIAGNOSIS — G8929 Other chronic pain: Secondary | ICD-10-CM

## 2013-08-31 DIAGNOSIS — R52 Pain, unspecified: Secondary | ICD-10-CM

## 2013-08-31 DIAGNOSIS — R109 Unspecified abdominal pain: Secondary | ICD-10-CM

## 2013-08-31 MED ORDER — SUCRALFATE 1 G PO TABS: 1.0000 g | ORAL_TABLET | Freq: Four times a day (QID) | ORAL | Status: DC

## 2013-08-31 NOTE — ED Provider Notes (Signed)
Medical screening examination/treatment/procedure(s) were conducted as a shared visit with non-physician practitioner(s) and myself.  I personally evaluated the patient during the encounter  64F here with abdominal pain. Severe epigastric pain and nausea, nonsurgical abdomen. CT obtained, normal. Pain controlled, stable for discharge, instructed to f/u with PCP.   Dagmar Hait, MD 08/31/13 [phone]

## 2013-08-31 NOTE — Progress Notes (Signed)
Subjective:    [name], female    DOB: 11/13/1966, 47 y.o.   MRN: [id number]  HPI  47 year old patient who is seen today following a the emergency department evaluation.  She presented on September 28 with a chief complaint of epigastric pain. She had an extensive ED evaluation which included a abdominal CT. She continues to have discomfort. She has been maintained on protonix and she was given a prescription for metoclopramide which she has not been using. She describes a constant achiness in the epigastric area with episodes of more sharp pain it can be aggravated by ingestion of food and medication. She has a chronic pain syndrome and is on chronic narcotics. She has nausea but no vomiting and no change in her bowel habits. No fever. Medical regimen also includes prednisone and she also may be taken diclofenac.  ED records reviewed  Past Medical History  Diagnosis Date  . ANXIETY 06/06/2007  . BACK PAIN 02/22/2009  . DEPRESSION 06/06/2007  . FREQUENCY, URINARY 07/07/2007  . GERD 06/06/2007  . HYPOTHYROIDISM 06/06/2007  . INTERSTITIAL CYSTITIS 09/11/2010  . NEPHROLITHIASIS, HX OF 11/05/2010  . OSTEOPENIA 10/14/2009  . OVERACTIVE BLADDER 10/14/2009  . PAIN, CHRONIC NEC 06/06/2007  . POLYARTHRITIS 08/19/2007  . 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 WEIGHT GAIN 02/22/2009  . Diabetes mellitus without complication   . Lupus     History   Social History  . Marital Status: Divorced    Spouse Name: N/A    Number of Children: N/A  . Years of Education: N/A   Occupational History  . Not on file.   Social History Main Topics  . Smoking status: Never Smoker   . Smokeless tobacco: Never Used  . Alcohol Use: No  . Drug Use: No  . Sexual Activity: Not on file   Other Topics Concern  . Not on file   Social History Narrative  . No narrative on file    Past Surgical History  Procedure Laterality Date  . Appendectomy    . Tonsillectomy    . Wrist reconstruction     . Revision total hip arthroplasty  06-2009  . Breast surgery      Family History  Problem Relation Age of Onset  . Hypertension Mother   . Arthritis Mother   . Asthma Sister   . Asthma Daughter   . Diabetes Other   . Heart attack Other     Allergies  Allergen Reactions  . Clarithromycin   . Dhea [Nutritional Supplements]   . Fish Allergy   . Latex   . Penicillins   . Prasterone   . Promethazine Hcl   . Hydromorphone Hcl Rash    "Rash all over"    Current Outpatient Prescriptions on File Prior to Visit  Medication Sig Dispense Refill  . albuterol (PROVENTIL HFA) 108 (90 BASE) MCG/ACT inhaler Inhale 2 puffs into the lungs every 4 (four) hours as needed for wheezing or shortness of breath.       . Ascorbic Acid (VITAMIN C GUMMIE PO) Take 4 tablets by mouth every morning.      Marland Kitchen aspirin 325 MG tablet Take 325 mg by mouth 2 (two) times daily.      . Calcium-Phosphorus-Vitamin D (CALCIUM GUMMIES PO) Take 2 tablets by mouth every morning.      . Cholecalciferol (VITAMIN D) 2000 UNITS CAPS Take 2,000 Units by mouth every morning.       . dapsone 25 MG tablet Take  25 mg by mouth every morning.       . diclofenac (VOLTAREN) 75 MG EC tablet Take 1 tablet (75 mg total) by mouth 2 (two) times daily.  180 tablet  6  . DULoxetine (CYMBALTA) 60 MG capsule Take 60 mg by mouth 2 (two) times daily.      . fentaNYL (DURAGESIC - DOSED MCG/HR) 50 MCG/HR Place 1 patch (50 mcg total) onto the skin every 3 (three) days.  10 patch  0  . lamoTRIgine (LAMICTAL) 25 MG tablet Take 25 mg by mouth every morning.       Marland Kitchen levothyroxine (SYNTHROID, LEVOTHROID) 25 MCG tablet Take 25 mcg by mouth daily before breakfast.      . LORazepam (ATIVAN) 1 MG tablet Take 1-2 mg by mouth 3 (three) times daily. 1mg  am and afternoon and 2 mg at bedtime      . metaxalone (SKELAXIN) 800 MG tablet Take 800 mg by mouth 3 (three) times daily as needed for pain.      . metFORMIN (GLUCOPHAGE-XR) 500 MG 24 hr tablet Take 500 mg  by mouth 2 (two) times daily.      . methotrexate 25 MG/ML injection Inject 25 mg into the skin once a week. On saturdays      . metoCLOPramide (REGLAN) 10 MG tablet Take 1 tablet (10 mg total) by mouth every 6 (six) hours as needed (nausea/headache).  6 tablet  0  . Multiple Vitamins-Minerals (HM MULTIVITAMIN ADULT GUMMY PO) Take 2 tablets by mouth daily.      Marland Kitchen oxyCODONE-acetaminophen (PERCOCET) 10-325 MG per tablet Take 1 tablet by mouth every 6 (six) hours as needed for pain.       . pantoprazole (PROTONIX) 40 MG tablet Take 40 mg by mouth 2 (two) times daily.      . predniSONE (DELTASONE) 10 MG tablet Take 10 mg by mouth daily.      . Teriparatide, Recombinant, (FORTEO) 600 MCG/2.4ML SOLN Inject 20 mcg into the skin every morning.        . topiramate (TOPAMAX) 100 MG tablet Take 1 tablet (100 mg total) by mouth 2 (two) times daily.  180 tablet  6  . traMADol (ULTRAM) 50 MG tablet Take 50 mg by mouth every 6 (six) hours as needed for pain.      . valACYclovir (VALTREX) 500 MG tablet Take 500 mg by mouth at bedtime.        No current facility-administered medications on file prior to visit.    BP 120/80  Pulse 87  Temp(Src) 98.3 F (36.8 C) (Oral)  Resp 20  Wt 210 lb (95.255 kg)  BMI 30.13 kg/m2  SpO2 97%  LMP 08/06/2013       Review of Systems  Constitutional: Positive for activity change, appetite change and fatigue. Negative for fever and unexpected weight change.  HENT: Negative for hearing loss, ear pain, nosebleeds, congestion, sore throat, mouth sores, trouble swallowing, neck stiffness, dental problem, voice change, sinus pressure and tinnitus.   Eyes: Negative for photophobia, pain, redness and visual disturbance.  Respiratory: Negative for cough, chest tightness and shortness of breath.   Cardiovascular: Negative for chest pain, palpitations and leg swelling.  Gastrointestinal: Positive for nausea and abdominal pain. Negative for vomiting, diarrhea, constipation,  blood in stool, abdominal distention and rectal pain.  Genitourinary: Negative for dysuria, urgency, frequency, hematuria, flank pain, vaginal bleeding, vaginal discharge, difficulty urinating, genital sores, vaginal pain, menstrual problem and pelvic pain.  Musculoskeletal: Negative for back pain  and arthralgias.  Skin: Negative for rash.  Neurological: Negative for dizziness, syncope, speech difficulty, weakness, light-headedness, numbness and headaches.  Hematological: Negative for adenopathy. Does not bruise/bleed easily.  Psychiatric/Behavioral: Negative for suicidal ideas, behavioral problems, self-injury, dysphoric mood and agitation. The patient is not nervous/anxious.        Objective:   Physical Exam  Constitutional: She is oriented to person, place, and time. She appears well-developed and well-nourished.  Alert and appropriate Appears unwell but in no acute distress Afebrile Vital signs stable No tachycardia  HENT:  Head: Normocephalic.  Right Ear: External ear normal.  Left Ear: External ear normal.  Mouth/Throat: Oropharynx is clear and moist.  Eyes: Conjunctivae and EOM are normal. Pupils are equal, round, and reactive to light.  Neck: Normal range of motion. Neck supple. No thyromegaly present.  Cardiovascular: Normal rate, regular rhythm, normal heart sounds and intact distal pulses.   Pulmonary/Chest: Effort normal and breath sounds normal.  Abdominal: Soft. Bowel sounds are normal. She exhibits no mass. There is tenderness.  Epigastric tenderness without guarding Bowel sounds slightly diminished but present  Musculoskeletal: Normal range of motion.  Lymphadenopathy:    She has no cervical adenopathy.  Neurological: She is alert and oriented to person, place, and time.  Skin: Skin is warm and dry. No rash noted.  Psychiatric: She has a normal mood and affect. Her behavior is normal.          Assessment & Plan:   Epigastric pain. Diclofenac will be  discontinued. She'll be placed on Carafate 1 g 4 times daily for 10 days. Protonix will be continued.  We'll consider for upper endoscopy if there is not prompt clinical improvement to rule out candidial  esophagitis , PUD or other etiologies. Chronic pain syndrome Polyarteritis Chronic prednisone use

## 2013-08-31 NOTE — Patient Instructions (Signed)
Medications as directed  (Carafate and metoclopramide)  Avoids foods high in acid such as tomatoes citrus juices, and spicy foods.  Avoid eating within two hours of lying down or before exercising.  Do not overheat.  Try smaller more frequent meals.  If symptoms persist,  call office Monday morning for GI referral  Discontinue diclofenac and any other anti-inflammatory medications   Hold tramadol

## 2013-09-05 ENCOUNTER — Ambulatory Visit (INDEPENDENT_AMBULATORY_CARE_PROVIDER_SITE_OTHER): Payer: Medicare Other | Admitting: Internal Medicine

## 2013-09-05 ENCOUNTER — Encounter: Payer: Self-pay | Admitting: Internal Medicine

## 2013-09-05 ENCOUNTER — Encounter: Payer: Self-pay | Admitting: Gastroenterology

## 2013-09-05 VITALS — BP 122/80 | HR 84 | Temp 97.9°F | Resp 20 | Wt 211.0 lb

## 2013-09-05 DIAGNOSIS — IMO0001 Reserved for inherently not codable concepts without codable children: Secondary | ICD-10-CM

## 2013-09-05 DIAGNOSIS — M130 Polyarthritis, unspecified: Secondary | ICD-10-CM

## 2013-09-05 DIAGNOSIS — R52 Pain, unspecified: Secondary | ICD-10-CM

## 2013-09-05 DIAGNOSIS — G8929 Other chronic pain: Secondary | ICD-10-CM

## 2013-09-05 DIAGNOSIS — F411 Generalized anxiety disorder: Secondary | ICD-10-CM

## 2013-09-05 DIAGNOSIS — R109 Unspecified abdominal pain: Secondary | ICD-10-CM

## 2013-09-05 MED ORDER — METOCLOPRAMIDE HCL 10 MG PO TABS: 10.0000 mg | ORAL_TABLET | Freq: Four times a day (QID) | ORAL | Status: DC | PRN

## 2013-09-05 NOTE — Progress Notes (Signed)
Subjective:    [name], female    DOB: 1965/12/26, 47 y.o.   MRN: [id number]  HPI  08/31/13. HPI 47 year old patient who is seen today following a the emergency department evaluation. She presented on September 28 with a chief complaint of epigastric pain. She had an extensive ED evaluation which included a abdominal CT. She continues to have discomfort. She has been maintained on protonix and she was given a prescription for metoclopramide which she has not been using. She describes a constant achiness in the epigastric area with episodes of more sharp pain it can be aggravated by ingestion of food and medication. She has a chronic pain syndrome and is on chronic narcotics. She has nausea but no vomiting and no change in her bowel habits.  No fever. Medical regimen also includes prednisone and she also may be taken diclofenac.  82/40. 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  Since her last visit here she has discontinued diclofenac aspirin and Ultram. Metoclopramide was prescribed unfortunately she apparently only received 6 tablets. She has been treated with Carafate in the past which was not helpful and she states caused dizziness. She continues to complain of constant epigastric aching pain. She states pain is aggravated by eating. She has adhered to a bland diet. Denies any fever or chills  Wt Readings from Last 3 Encounters:  09/05/13 211 lb (95.709 kg)  08/31/13 210 lb (95.255 kg)  08/03/13 220 lb (99.791 kg)    Past Medical History  Diagnosis Date  . ANXIETY 06/06/2007  . BACK PAIN 02/22/2009  . DEPRESSION 06/06/2007  . FREQUENCY, URINARY 07/07/2007  . GERD 06/06/2007  . HYPOTHYROIDISM 06/06/2007  . INTERSTITIAL CYSTITIS 09/11/2010  . NEPHROLITHIASIS, HX OF 11/05/2010  . OSTEOPENIA 10/14/2009  .  OVERACTIVE BLADDER 10/14/2009  . PAIN, CHRONIC NEC 06/06/2007  . POLYARTHRITIS 08/19/2007  . 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 WEIGHT GAIN 02/22/2009  . Diabetes mellitus without complication   . Lupus     History   Social History  . Marital Status: Divorced    Spouse Name: N/A    Number of Children: N/A  . Years of Education: N/A   Occupational History  . Not on file.   Social History Main Topics  . Smoking status: Never Smoker   . Smokeless tobacco: Never Used  . Alcohol Use: No  . Drug Use: No  . Sexual Activity: Not on file   Other Topics Concern  . Not on file   Social History Narrative  . No narrative on file    Past Surgical History  Procedure Laterality Date  . Appendectomy    . Tonsillectomy    . Wrist reconstruction    . Revision total hip arthroplasty  06-2009  . Breast surgery      Family History  Problem Relation Age of Onset  . Hypertension Mother   . Arthritis Mother   . Asthma Sister   . Asthma Daughter   . Diabetes Other   . Heart attack Other     Allergies  Allergen Reactions  . Clarithromycin   . Dhea [Nutritional Supplements]   . Fish Allergy   . Latex   . Penicillins   . Prasterone   . Promethazine Hcl   . Hydromorphone Hcl Rash    "Rash all over"    Current Outpatient Prescriptions on  File Prior to Visit  Medication Sig Dispense Refill  . albuterol (PROVENTIL HFA) 108 (90 BASE) MCG/ACT inhaler Inhale 2 puffs into the lungs every 4 (four) hours as needed for wheezing or shortness of breath.       . Ascorbic Acid (VITAMIN C GUMMIE PO) Take 4 tablets by mouth every morning.      Marland Kitchen aspirin 325 MG tablet Take 325 mg by mouth 2 (two) times daily.      . Calcium-Phosphorus-Vitamin D (CALCIUM GUMMIES PO) Take 2 tablets by mouth every morning.      . Cholecalciferol (VITAMIN D) 2000 UNITS CAPS Take 2,000 Units by mouth every morning.       . dapsone 25 MG tablet Take 25 mg by mouth every morning.       . DULoxetine (CYMBALTA) 60  MG capsule Take 60 mg by mouth 2 (two) times daily.      . fentaNYL (DURAGESIC - DOSED MCG/HR) 50 MCG/HR Place 1 patch (50 mcg total) onto the skin every 3 (three) days.  10 patch  0  . lamoTRIgine (LAMICTAL) 25 MG tablet Take 25 mg by mouth every morning.       Marland Kitchen levothyroxine (SYNTHROID, LEVOTHROID) 25 MCG tablet Take 25 mcg by mouth daily before breakfast.      . LORazepam (ATIVAN) 1 MG tablet Take 1-2 mg by mouth 3 (three) times daily. 1mg  am and afternoon and 2 mg at bedtime      . metaxalone (SKELAXIN) 800 MG tablet Take 800 mg by mouth 3 (three) times daily as needed for pain.      . metFORMIN (GLUCOPHAGE-XR) 500 MG 24 hr tablet Take 500 mg by mouth 2 (two) times daily.      . methotrexate 25 MG/ML injection Inject 25 mg into the skin once a week. On saturdays      . metoCLOPramide (REGLAN) 10 MG tablet Take 1 tablet (10 mg total) by mouth every 6 (six) hours as needed (nausea/headache).  6 tablet  0  . Multiple Vitamins-Minerals (HM MULTIVITAMIN ADULT GUMMY PO) Take 2 tablets by mouth daily.      Marland Kitchen oxyCODONE-acetaminophen (PERCOCET) 10-325 MG per tablet Take 1 tablet by mouth every 6 (six) hours as needed for pain.       . pantoprazole (PROTONIX) 40 MG tablet Take 40 mg by mouth 2 (two) times daily.      . predniSONE (DELTASONE) 10 MG tablet Take 10 mg by mouth daily.      . sucralfate (CARAFATE) 1 G tablet Take 1 tablet (1 g total) by mouth 4 (four) times daily.  40 tablet  1  . Teriparatide, Recombinant, (FORTEO) 600 MCG/2.4ML SOLN Inject 20 mcg into the skin every morning.        . topiramate (TOPAMAX) 100 MG tablet Take 1 tablet (100 mg total) by mouth 2 (two) times daily.  180 tablet  6  . traMADol (ULTRAM) 50 MG tablet Take 50 mg by mouth every 6 (six) hours as needed for pain.      . valACYclovir (VALTREX) 500 MG tablet Take 500 mg by mouth at bedtime.        No current facility-administered medications on file prior to visit.    BP 122/80  Pulse 84  Temp(Src) 97.9 F (36.6 C)  (Oral)  Resp 20  Wt 211 lb (95.709 kg)  BMI 30.28 kg/m2  SpO2 97%  LMP 08/06/2013       Review of Systems  Constitutional: Positive for  activity change, appetite change and fatigue.  HENT: Negative for hearing loss, congestion, sore throat, rhinorrhea, dental problem, sinus pressure and tinnitus.   Eyes: Negative for pain, discharge and visual disturbance.  Respiratory: Negative for cough and shortness of breath.   Cardiovascular: Negative for chest pain, palpitations and leg swelling.  Gastrointestinal: Positive for abdominal pain. Negative for nausea, vomiting, diarrhea, constipation, blood in stool and abdominal distention.  Genitourinary: Negative for dysuria, urgency, frequency, hematuria, flank pain, vaginal bleeding, vaginal discharge, difficulty urinating, vaginal pain and pelvic pain.  Musculoskeletal: Negative for joint swelling, arthralgias and gait problem.  Skin: Negative for rash.  Neurological: Negative for dizziness, syncope, speech difficulty, weakness, numbness and headaches.  Hematological: Negative for adenopathy.  Psychiatric/Behavioral: Negative for behavioral problems, dysphoric mood and agitation. The patient is not nervous/anxious.        Objective:   Physical Exam  Constitutional: She is oriented to person, place, and time. She appears well-developed and well-nourished.  Obese Appears unwell but in no acute distress  HENT:  Head: Normocephalic.  Right Ear: External ear normal.  Left Ear: External ear normal.  Mouth/Throat: Oropharynx is clear and moist.  Eyes: Conjunctivae and EOM are normal. Pupils are equal, round, and reactive to light.  Neck: Normal range of motion. Neck supple. No thyromegaly present.  Cardiovascular: Normal rate, regular rhythm, normal heart sounds and intact distal pulses.   Pulmonary/Chest: Effort normal and breath sounds normal.  Abdominal: Soft. Bowel sounds are normal. She exhibits no mass. There is tenderness.   Epigastric tenderness without rebound  Musculoskeletal: Normal range of motion.  Lymphadenopathy:    She has no cervical adenopathy.  Neurological: She is alert and oriented to person, place, and time.  Skin: Skin is warm and dry. No rash noted.  Psychiatric: She has a normal mood and affect. Her behavior is normal.          Assessment & Plan:    Persistent epigastric pain in spite of maximal medical therapy. Chronic immunosuppressive therapy.  Will continue present therapy and refill metoclopramide. We'll continue to avoid gastric irritants. We'll schedule prompt EGD  to rule out candidal esophagitis, ulcer disease or other pathology

## 2013-09-05 NOTE — Patient Instructions (Signed)
Avoids foods high in acid such as tomatoes citrus juices, and spicy foods.  Avoid eating within two hours of lying down or before exercising.  Do not overheat.  Try smaller more frequent meals.  If symptoms persist, elevate the head of her bed 12 inches while sleeping.  GI consultation as discussed

## 2013-09-07 ENCOUNTER — Encounter: Payer: Self-pay | Admitting: Nurse Practitioner

## 2013-09-07 ENCOUNTER — Inpatient Hospital Stay: Payer: Self-pay | Admitting: Psychiatry

## 2013-09-07 ENCOUNTER — Ambulatory Visit (INDEPENDENT_AMBULATORY_CARE_PROVIDER_SITE_OTHER): Payer: Medicare Other | Admitting: Nurse Practitioner

## 2013-09-07 VITALS — BP 110/80 | HR 81 | Ht 69.0 in | Wt 209.0 lb

## 2013-09-07 DIAGNOSIS — R1013 Epigastric pain: Secondary | ICD-10-CM

## 2013-09-07 DIAGNOSIS — G8929 Other chronic pain: Secondary | ICD-10-CM

## 2013-09-07 DIAGNOSIS — M130 Polyarthritis, unspecified: Secondary | ICD-10-CM

## 2013-09-07 LAB — COMPREHENSIVE METABOLIC PANEL
Anion Gap: 6 — ABNORMAL LOW (ref 7–16)
BUN: 18 mg/dL (ref 7–18)
Bilirubin,Total: 0.2 mg/dL (ref 0.2–1.0)
Calcium, Total: 8.7 mg/dL (ref 8.5–10.1)
Chloride: 106 mmol/L (ref 98–107)
Creatinine: 0.99 mg/dL (ref 0.60–1.30)
EGFR (African American): >60
EGFR (Non-African Amer.): >60
Glucose: 152 mg/dL — ABNORMAL HIGH (ref 65–99)
Osmolality: 281 (ref 275–301)
Potassium: 4 mmol/L (ref 3.5–5.1)
SGPT (ALT): 37 U/L (ref 12–78)
Sodium: 138 mmol/L (ref 136–145)

## 2013-09-07 LAB — DRUG SCREEN, URINE
Barbiturates, Ur Screen: NEGATIVE (ref ?–200)
Benzodiazepine, Ur Scrn: NEGATIVE (ref ?–200)
Cannabinoid 50 Ng, Ur ~~LOC~~: NEGATIVE (ref ?–50)
Cocaine Metabolite,Ur ~~LOC~~: NEGATIVE (ref ?–300)
MDMA (Ecstasy)Ur Screen: NEGATIVE (ref ?–500)
Opiate, Ur Screen: POSITIVE (ref ?–300)
Phencyclidine (PCP) Ur S: NEGATIVE (ref ?–25)
Tricyclic, Ur Screen: NEGATIVE (ref ?–1000)

## 2013-09-07 LAB — CBC
MCV: 85 fL (ref 80–100)
Platelet: 223 10*3/uL (ref 150–440)
RBC: 4.86 10*6/uL (ref 3.80–5.20)
RDW: 18.4 % — ABNORMAL HIGH (ref 11.5–14.5)
WBC: 10.3 10*3/uL (ref 3.6–11.0)

## 2013-09-07 LAB — URINALYSIS, COMPLETE
Bilirubin,UR: NEGATIVE
Leukocyte Esterase: NEGATIVE
Nitrite: NEGATIVE
Ph: 5 (ref 4.5–8.0)
Protein: 100
RBC,UR: 3184 /HPF (ref 0–5)
Specific Gravity: 1.031 (ref 1.003–1.030)
Squamous Epithelial: 30

## 2013-09-07 LAB — ETHANOL
Ethanol %: <0.003 % (ref 0.000–0.080)
Ethanol: <3 mg/dL

## 2013-09-07 LAB — TSH: Thyroid Stimulating Horm: 1.76 u[IU]/mL

## 2013-09-07 NOTE — Progress Notes (Signed)
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 She is on multiple medications including chronic immunosuppressants. Patient has polyarthritis and chronic pain syndrome. She is followed by a Rheumatologist in [place] and has also been evaluated by [facility]. Patient gives a history of PUD approximately 9 years ago in [place]. She had a cholecystectomy two years ago for cholecystitis.   Patient was evaluated in ED for epigastric pain late September. CBC, lipase and LFTs were unremarkable. CTscan with contrast unrevealing. Patient saw PCP in follow up on 08/31/13 at which time diclofenac was discontinued, carafate started, and BID Protonix continued. Patient remains on BID aspirin which she states is to prevent blood clots.  Patient saw PCP again two days ago with persistent epigastric pain and was referred here. She can't take Carafate, it caused dizziness. Reglan isn't helping. Epigastric pain constant for 3 weeks, feels like when diagnosed with PUD. She is nauseated. BMs are normal.   Past Medical History  Diagnosis Date  . ANXIETY 06/06/2007  . BACK PAIN 02/22/2009  . DEPRESSION 06/06/2007  . GERD 06/06/2007  . HYPOTHYROIDISM 06/06/2007  . INTERSTITIAL CYSTITIS 09/11/2010  . NEPHROLITHIASIS, HX OF 11/05/2010  . OSTEOPENIA 10/14/2009  . OVERACTIVE BLADDER 10/14/2009  . PAIN, CHRONIC NEC 06/06/2007  . POLYARTHRITIS 08/19/2007  . 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 Diabetes mellitus without complication   . Lupus   . Cervical cancer     Family History  Problem Relation Age of Onset  . Hypertension Mother   . Arthritis Mother   . Asthma Sister   . Asthma Daughter   . Diabetes Maternal Grandmother   . Heart disease Maternal Grandmother   . Colon cancer Maternal Uncle 45  . Pancreatic cancer Father   . Crohn's disease  Maternal Aunt   . Diabetes Maternal Aunt    History  Substance Use Topics  . Smoking status: Never Smoker   . Smokeless tobacco: Never Used  . Alcohol Use: No   Current Outpatient Prescriptions  Medication Sig Dispense Refill  . albuterol (PROVENTIL HFA) 108 (90 BASE) MCG/ACT inhaler Inhale 2 puffs into the lungs every 4 (four) hours as needed for wheezing or shortness of breath.       . Ascorbic Acid (VITAMIN C GUMMIE PO) Take 4 tablets by mouth every morning.      Marland Kitchen aspirin 325 MG tablet Take 325 mg by mouth 2 (two) times daily.      . Calcium-Phosphorus-Vitamin D (CALCIUM GUMMIES PO) Take 2 tablets by mouth every morning.      . Cholecalciferol (VITAMIN D) 2000 UNITS CAPS Take 2,000 Units by mouth every morning.       . dapsone 25 MG tablet Take 25 mg by mouth every morning.       . DULoxetine (CYMBALTA) 60 MG capsule Take 60 mg by mouth 2 (two) times daily.      . fentaNYL (DURAGESIC - DOSED MCG/HR) 50 MCG/HR Place 1 patch (50 mcg total) onto the skin every 3 (three) days.  10 patch  0  . lamoTRIgine (LAMICTAL) 25 MG tablet Take 25 mg by mouth every morning.       Marland Kitchen levothyroxine (SYNTHROID, LEVOTHROID) 25 MCG tablet Take 25 mcg by mouth daily before breakfast.      . LORazepam (ATIVAN) 1 MG tablet Take 1-2 mg by mouth 3 (three) times  daily. 1mg  am and afternoon and 2 mg at bedtime      . metaxalone (SKELAXIN) 800 MG tablet Take 800 mg by mouth 3 (three) times daily as needed for pain.      . metFORMIN (GLUCOPHAGE-XR) 500 MG 24 hr tablet Take 500 mg by mouth 2 (two) times daily.      . methotrexate 25 MG/ML injection Inject 25 mg into the skin once a week. On saturdays      . metoCLOPramide (REGLAN) 10 MG tablet Take 1 tablet (10 mg total) by mouth every 6 (six) hours as needed (nausea/headache).  60 tablet  0  . Multiple Vitamins-Minerals (HM MULTIVITAMIN ADULT GUMMY PO) Take 2 tablets by mouth daily.      Marland Kitchen oxyCODONE-acetaminophen (PERCOCET) 10-325 MG per tablet Take 1 tablet by  mouth every 6 (six) hours as needed for pain.       . pantoprazole (PROTONIX) 40 MG tablet Take 40 mg by mouth 2 (two) times daily.      . predniSONE (DELTASONE) 10 MG tablet Take 10 mg by mouth daily.      . Teriparatide, Recombinant, (FORTEO) 600 MCG/2.4ML SOLN Inject 20 mcg into the skin every morning.        . topiramate (TOPAMAX) 100 MG tablet Take 1 tablet (100 mg total) by mouth 2 (two) times daily.  180 tablet  6  . valACYclovir (VALTREX) 500 MG tablet Take 500 mg by mouth at bedtime.        No current facility-administered medications for this visit.   Allergies  Allergen Reactions  . Clarithromycin   . Dhea [Nutritional Supplements]   . Fish Allergy   . Latex   . Penicillins   . Prasterone   . Promethazine Hcl   . Hydromorphone Hcl Rash    "Rash all over"     Review of Systems: Positive for anxiety, arthritis, depression, fatigue, headaches, skin rash sleeping problems. All other systems reviewed and negative except where noted in HPI.    Ct Abdomen Pelvis W Contrast  08/27/2013   CLINICAL DATA:  Mid abdominal pain, nausea.  EXAM: CT ABDOMEN AND PELVIS WITH CONTRAST  TECHNIQUE: Multidetector CT imaging of the abdomen and pelvis was performed using the standard protocol following bolus administration of intravenous contrast.  CONTRAST:  OMNIPAQUE IOHEXOL 300 MG/ML  SOLN  COMPARISON:  07/02/2009  FINDINGS: Dependent atelectasis in the lung bases. No effusions. Heart is normal size.  Prior cholecystectomy. Liver, spleen, pancreas, adrenals and kidneys are unremarkable. Small cyst in the lower pole of the right kidney. No hydronephrosis.  Uterus and right ovary are unremarkable. 2.7 cm cyst in knee left ovary. Trace free fluid in the pelvis. Urinary bladder is unremarkable. Large and small bowel are grossly unremarkable. Aorta is normal caliber.  No acute bony abnormality.  IMPRESSION: Small left ovarian cyst. Trace free fluid in the pelvis.   Electronically Signed   By: Charlett Nose M.D.   On: 08/27/2013 23:44    Physical Exam: BP 110/80  Pulse 81  Ht 5\' 9"  (1.753 m)  Wt 209 lb (94.802 kg)  BMI 30.85 kg/m2  LMP 09/05/2013 Constitutional: Pleasant,well-developed, hispanic female in no acute distress. HEENT: Normocephalic and atraumatic. Conjunctivae are normal. No scleral icterus. Neck supple.  Cardiovascular: Normal rate, regular rhythm.  Pulmonary/chest: Effort normal and breath sounds normal. No wheezing, rales or rhonchi. Abdominal: Soft, nondistended, nontender. Bowel sounds active throughout. There are no masses palpable. No hepatomegaly. Extremities: no edema Lymphadenopathy: No  cervical adenopathy noted. Neurological: Alert and oriented to person place and time. Skin: Skin is warm and dry. No rashes noted. Psychiatric: Normal mood and affect. Behavior is normal.   ASSESSMENT AND PLAN:  1. 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 She has an undefined rheumatologic disorder with chronic pain and requires chronic narcotics.   2. Epigastric pain, three week history. CTscan and labs unrevealing. She is s/p cholecystectomy in 2012. Rule out recurrent PUD, especially in setting of significant NSAID use For further evaluation patient will be scheduled for EGD with propofol. The benefits, risks, and potential complications of EGD with possible biopsies  were discussed with the patient and she agrees to proceed.   3. History of PUD nine years ago in [place].

## 2013-09-07 NOTE — Patient Instructions (Signed)

## 2013-09-08 ENCOUNTER — Telehealth: Payer: Self-pay | Admitting: Gastroenterology

## 2013-09-08 ENCOUNTER — Ambulatory Visit: Payer: Medicare Other | Admitting: *Deleted

## 2013-09-08 ENCOUNTER — Encounter: Payer: Medicare Other | Admitting: Gastroenterology

## 2013-09-08 DIAGNOSIS — R1084 Generalized abdominal pain: Secondary | ICD-10-CM | POA: Insufficient documentation

## 2013-09-08 DIAGNOSIS — R1013 Epigastric pain: Secondary | ICD-10-CM | POA: Insufficient documentation

## 2013-09-08 NOTE — Telephone Encounter (Signed)
no

## 2013-09-09 LAB — LIPASE, BLOOD: Lipase: 238 U/L (ref 73–393)

## 2013-09-11 NOTE — Progress Notes (Signed)
Procedure not done; pt in hospital at [facility]

## 2013-09-27 ENCOUNTER — Encounter: Payer: Self-pay | Admitting: Internal Medicine

## 2013-09-27 ENCOUNTER — Telehealth: Payer: Self-pay | Admitting: Internal Medicine

## 2013-09-27 ENCOUNTER — Ambulatory Visit (INDEPENDENT_AMBULATORY_CARE_PROVIDER_SITE_OTHER): Payer: Medicare Other | Admitting: Internal Medicine

## 2013-09-27 VITALS — BP 120/80 | HR 107 | Temp 97.9°F | Resp 20 | Wt 209.0 lb

## 2013-09-27 DIAGNOSIS — IMO0001 Reserved for inherently not codable concepts without codable children: Secondary | ICD-10-CM

## 2013-09-27 DIAGNOSIS — M130 Polyarthritis, unspecified: Secondary | ICD-10-CM

## 2013-09-27 DIAGNOSIS — F411 Generalized anxiety disorder: Secondary | ICD-10-CM

## 2013-09-27 DIAGNOSIS — G8929 Other chronic pain: Secondary | ICD-10-CM

## 2013-09-27 LAB — HEMOGLOBIN A1C: Hgb A1c MFr Bld: 6.2 % (ref 4.6–6.5)

## 2013-09-27 MED ORDER — AZATHIOPRINE 50 MG PO TABS: 75.0000 mg | ORAL_TABLET | Freq: Every day | ORAL | Status: DC

## 2013-09-27 MED ORDER — METHYLPHENIDATE HCL 5 MG PO TABS: 10.0000 mg | ORAL_TABLET | Freq: Two times a day (BID) | ORAL | Status: DC

## 2013-09-27 NOTE — Patient Instructions (Signed)
Follow up with psychiatry and rheumatology  Return in 3 months for follow-up

## 2013-09-27 NOTE — Telephone Encounter (Signed)
Pt pked up rx for methylphenidate (RITALIN) 5 MG tablet It is supposed to be 1 tab 2 x /day.  But it states 2 tabs 2 X day. Now insurance will not pay. Pt needs new rx w/ correct instructions

## 2013-09-28 ENCOUNTER — Encounter: Payer: Self-pay | Admitting: Internal Medicine

## 2013-09-28 MED ORDER — METHYLPHENIDATE HCL 10 MG PO TABS: 10.0000 mg | ORAL_TABLET | Freq: Two times a day (BID) | ORAL | Status: DC

## 2013-09-28 NOTE — Telephone Encounter (Signed)
Spoke to pt told her Dr. Kirtland Bouchard just ordered the Rx the way it was originally ordered. I told him insurance would not cover, he said can change to 10 mg twice a day, which would still equal the 20 mg that was ordered,  but if that does not work then you will have to check with your psychiatrist. Pt said that would be fine. Told her Rx will be at front desk for pick up. Pt verbalized understanding. Rx printed and signed, put at front desk for pickup.

## 2013-09-28 NOTE — Progress Notes (Signed)
Subjective:    [name], female    DOB: June 02, 1966, 47 y.o.   MRN: [id number]  HPI  47 year old patient who was seen today following a hospital discharge from [facility]. Hospital records were reviewed. 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 She was seen by psychiatry and her psychotropic medications were revised. Since her discharge she has done quite well. She is followed by rheumatology for polyarthritis and remains on both Imuran that the truck states and prednisone therapy She has been seen here recently for abdominal pain which has resolved.  She does have followup with Dr. Starling Manns will try a psychiatric Association  Past Medical History  Diagnosis Date  . ANXIETY 06/06/2007  . BACK PAIN 02/22/2009  . DEPRESSION 06/06/2007  . FREQUENCY, URINARY 07/07/2007  . GERD 06/06/2007  . HYPOTHYROIDISM 06/06/2007  . INTERSTITIAL CYSTITIS 09/11/2010  . NEPHROLITHIASIS, HX OF 11/05/2010  . OSTEOPENIA 10/14/2009  . OVERACTIVE BLADDER 10/14/2009  . PAIN, CHRONIC NEC 06/06/2007  . POLYARTHRITIS 08/19/2007  . 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 WEIGHT GAIN 02/22/2009  . Diabetes mellitus without complication   . Lupus   . Cervical cancer     History   Social History  . Marital Status: Divorced    Spouse Name: N/A    Number of Children: 1  . Years of Education: N/A   Occupational History  . DISABLED    Social History Main Topics  . Smoking status: Never Smoker   . Smokeless tobacco: Never Used  . Alcohol Use: No  . Drug Use: No  . Sexual Activity: Not on file   Other Topics Concern  . Not on file   Social History Narrative  . No narrative on file    Past Surgical History  Procedure Laterality Date  . Appendectomy    . Tonsillectomy    . Wrist reconstruction    . Revision total hip arthroplasty  06-2009  . Breast surgery      Family History   Problem Relation Age of Onset  . Hypertension Mother   . Arthritis Mother   . Asthma Sister   . Asthma Daughter   . Diabetes Maternal Grandmother   . Heart disease Maternal Grandmother   . Colon cancer Maternal Uncle 45  . Pancreatic cancer Father   . Crohn's disease Maternal Aunt   . Diabetes Maternal Aunt     Allergies  Allergen Reactions  . Clarithromycin   . Dhea [Nutritional Supplements]   . Fish Allergy   . Latex   . Penicillins   . Prasterone   . Promethazine Hcl   . Hydromorphone Hcl Rash    "Rash all over"    Current Outpatient Prescriptions on File Prior to Visit  Medication Sig Dispense Refill  . albuterol (PROVENTIL HFA) 108 (90 BASE) MCG/ACT inhaler Inhale 2 puffs into the lungs every 4 (four) hours as needed for wheezing or shortness of breath.       . Ascorbic Acid (VITAMIN C GUMMIE PO) Take 4 tablets by mouth every morning.      . Calcium-Phosphorus-Vitamin D (CALCIUM GUMMIES PO) Take 2 tablets by mouth every morning.      . Cholecalciferol (VITAMIN D) 2000 UNITS CAPS Take 2,000 Units by mouth every morning.       . dapsone 25 MG tablet Take 25 mg by mouth every morning.       . DULoxetine (CYMBALTA) 60  MG capsule Take 60 mg by mouth 2 (two) times daily.      . fentaNYL (DURAGESIC - DOSED MCG/HR) 50 MCG/HR Place 1 patch (50 mcg total) onto the skin every 3 (three) days.  10 patch  0  . levothyroxine (SYNTHROID, LEVOTHROID) 25 MCG tablet Take 25 mcg by mouth daily before breakfast.      . LORazepam (ATIVAN) 1 MG tablet Take 1-2 mg by mouth 3 (three) times daily. 1mg  am and afternoon and 2 mg at bedtime      . metaxalone (SKELAXIN) 800 MG tablet Take 800 mg by mouth 3 (three) times daily as needed for pain.      . methotrexate 25 MG/ML injection Inject 25 mg into the skin once a week. On saturdays      . Multiple Vitamins-Minerals (HM MULTIVITAMIN ADULT GUMMY PO) Take 2 tablets by mouth daily.      Marland Kitchen oxyCODONE-acetaminophen (PERCOCET) 10-325 MG per tablet Take  1 tablet by mouth every 6 (six) hours as needed for pain.       . pantoprazole (PROTONIX) 40 MG tablet Take 40 mg by mouth 2 (two) times daily.      . predniSONE (DELTASONE) 10 MG tablet Take 10 mg by mouth daily.      Marland Kitchen topiramate (TOPAMAX) 100 MG tablet Take 1 tablet (100 mg total) by mouth 2 (two) times daily.  180 tablet  6  . valACYclovir (VALTREX) 500 MG tablet Take 500 mg by mouth at bedtime.       . metFORMIN (GLUCOPHAGE-XR) 500 MG 24 hr tablet Take 500 mg by mouth 2 (two) times daily.       No current facility-administered medications on file prior to visit.    BP 120/80  Pulse 107  Temp(Src) 97.9 F (36.6 C) (Oral)  Resp 20  Wt 209 lb (94.802 kg)  BMI 30.85 kg/m2  SpO2 95%  LMP 09/05/2013       Review of Systems  HENT: Negative for congestion, dental problem, hearing loss, rhinorrhea, sinus pressure, sore throat and tinnitus.   Eyes: Negative for pain, discharge and visual disturbance.  Respiratory: Negative for cough and shortness of breath.   Cardiovascular: Negative for chest pain, palpitations and leg swelling.  Gastrointestinal: Negative for nausea, vomiting, abdominal pain, diarrhea, constipation, blood in stool and abdominal distention.  Genitourinary: Negative for dysuria, urgency, frequency, hematuria, flank pain, vaginal bleeding, vaginal discharge, difficulty urinating, vaginal pain and pelvic pain.  Musculoskeletal: Positive for arthralgias and gait problem. Negative for joint swelling.  Skin: Negative for rash.  Neurological: Positive for weakness. Negative for dizziness, syncope, speech difficulty, numbness and headaches.  Hematological: Negative for adenopathy.  Psychiatric/Behavioral: Positive for behavioral problems. Negative for dysphoric mood and agitation. The patient is not nervous/anxious.        Objective:   Physical Exam  Constitutional: She is oriented to person, place, and time. She appears well-developed and well-nourished.   Afebrile Blood pressure 120/80 Bright affect  HENT:  Head: Normocephalic.  Right Ear: External ear normal.  Left Ear: External ear normal.  Mouth/Throat: Oropharynx is clear and moist.  Eyes: Conjunctivae and EOM are normal. Pupils are equal, round, and reactive to light.  Neck: Normal range of motion. Neck supple. No thyromegaly present.  Cardiovascular: Normal rate, regular rhythm, normal heart sounds and intact distal pulses.   Pulmonary/Chest: Effort normal and breath sounds normal.  Abdominal: Soft. Bowel sounds are normal. She exhibits no mass. There is no tenderness.  Musculoskeletal: Normal range  of motion.  Lymphadenopathy:    She has no cervical adenopathy.  Neurological: She is alert and oriented to person, place, and time.  Skin: Skin is warm and dry. No rash noted.  Psychiatric: She has a normal mood and affect. Her behavior is normal.          Assessment & Plan:   History of major recurrent depression. Stable followup psychiatry Diabetes mellitus. Presently off metformin. We'll continue to track blood sugars and resume if blood sugars become elevated Polyarthritis stable. Followup rheumatology Abdominal pain resolved Chronic pain syndrome  Re check in 3 months

## 2013-11-14 ENCOUNTER — Ambulatory Visit: Payer: Self-pay | Admitting: Obstetrics and Gynecology

## 2013-12-11 ENCOUNTER — Other Ambulatory Visit (INDEPENDENT_AMBULATORY_CARE_PROVIDER_SITE_OTHER): Payer: Medicare Other

## 2013-12-11 DIAGNOSIS — Z79899 Other long term (current) drug therapy: Secondary | ICD-10-CM

## 2013-12-11 DIAGNOSIS — M949 Disorder of cartilage, unspecified: Secondary | ICD-10-CM

## 2013-12-11 DIAGNOSIS — I776 Arteritis, unspecified: Secondary | ICD-10-CM

## 2013-12-11 DIAGNOSIS — M899 Disorder of bone, unspecified: Secondary | ICD-10-CM

## 2013-12-11 DIAGNOSIS — M359 Systemic involvement of connective tissue, unspecified: Secondary | ICD-10-CM

## 2013-12-11 LAB — HEPATIC FUNCTION PANEL
ALT: 16 U/L (ref 0–35)
AST: 14 U/L (ref 0–37)
Albumin: 3.8 g/dL (ref 3.5–5.2)
Alkaline Phosphatase: 68 U/L (ref 39–117)
Bilirubin, Direct: 0.1 mg/dL (ref 0.0–0.3)
TOTAL PROTEIN: 6.3 g/dL (ref 6.0–8.3)
Total Bilirubin: 0.7 mg/dL (ref 0.3–1.2)

## 2013-12-11 LAB — CBC WITH DIFFERENTIAL/PLATELET
BASOS PCT: 0.2 % (ref 0.0–3.0)
Basophils Absolute: 0 10*3/uL (ref 0.0–0.1)
Eosinophils Absolute: 0.2 10*3/uL (ref 0.0–0.7)
Eosinophils Relative: 2.3 % (ref 0.0–5.0)
HCT: 38 % (ref 36.0–46.0)
Hemoglobin: 12.4 g/dL (ref 12.0–15.0)
Lymphocytes Relative: 28.8 % (ref 12.0–46.0)
Lymphs Abs: 1.9 10*3/uL (ref 0.7–4.0)
MCHC: 32.5 g/dL (ref 30.0–36.0)
MCV: 89 fl (ref 78.0–100.0)
MONO ABS: 0.4 10*3/uL (ref 0.1–1.0)
Monocytes Relative: 5.9 % (ref 3.0–12.0)
NEUTROS PCT: 62.8 % (ref 43.0–77.0)
Neutro Abs: 4.2 10*3/uL (ref 1.4–7.7)
Platelets: 194 10*3/uL (ref 150.0–400.0)
RBC: 4.26 Mil/uL (ref 3.87–5.11)
RDW: 20.5 % — ABNORMAL HIGH (ref 11.5–14.6)
WBC: 6.7 10*3/uL (ref 4.5–10.5)

## 2013-12-11 LAB — HIGH SENSITIVITY CRP: CRP, High Sensitivity: 10.81 mg/L — ABNORMAL HIGH (ref 0.000–5.000)

## 2013-12-11 LAB — CREATININE, SERUM: Creatinine, Ser: 0.9 mg/dL (ref 0.4–1.2)

## 2013-12-12 LAB — C3 AND C4
C3 Complement: 113 mg/dL (ref 90–180)
C4 Complement: 25 mg/dL (ref 10–40)

## 2013-12-12 LAB — VITAMIN D 25 HYDROXY (VIT D DEFICIENCY, FRACTURES): Vit D, 25-Hydroxy: 52 ng/mL (ref 30–89)

## 2013-12-17 ENCOUNTER — Telehealth: Payer: Self-pay | Admitting: Internal Medicine

## 2013-12-17 NOTE — Telephone Encounter (Signed)
[facility] requesting new script for levothyroxine (SYNTHROID, LEVOTHROID) 25 MCG tablet #90

## 2013-12-18 ENCOUNTER — Other Ambulatory Visit: Payer: Self-pay | Admitting: Internal Medicine

## 2013-12-18 MED ORDER — LEVOTHYROXINE SODIUM 25 MCG PO TABS: 25.0000 ug | ORAL_TABLET | Freq: Every day | ORAL | Status: DC

## 2013-12-18 NOTE — Telephone Encounter (Signed)
Rx sent to pharmacy   

## 2014-01-03 ENCOUNTER — Encounter: Payer: Self-pay | Admitting: Internal Medicine

## 2014-01-10 ENCOUNTER — Encounter: Payer: Self-pay | Admitting: Internal Medicine

## 2014-01-16 ENCOUNTER — Other Ambulatory Visit: Payer: Self-pay | Admitting: Internal Medicine

## 2014-03-06 ENCOUNTER — Telehealth: Payer: Self-pay | Admitting: Internal Medicine

## 2014-03-06 NOTE — Telephone Encounter (Signed)
Patient Information:  Caller Name: Shalanda  Phone: [phone]  Patient: [name] [name], [name] [name]  Gender: Female  DOB: [birth year]  Age: 48 Years  PCP: Bluford Kaufmann (Family Practice > 71yrs old)  Pregnant: No  Office Follow Up:  Does the office need to follow up with this patient?: No  Instructions For The Office: N/A  RN Note:  Advised needs an appt. for an evaluation. Offered 03/07/14, but she will be out of town. Pt. to call and make appt. for 03/08/14.  Symptoms  Reason For Call & Symptoms: Pt. with Lupus. Has been having a lot of  pain in both feet and ankles. Was so severe last night, she had to take Percocet. Describes it as pins and needles.  Reviewed Health History In EMR: Yes  Reviewed Medications In EMR: Yes  Reviewed Allergies In EMR: Yes  Reviewed Surgeries / Procedures: Yes  Date of Onset of Symptoms: 02/12/2014  Treatments Tried: Percocet  Treatments Tried Worked: No OB / GYN:  LMP: 02/27/2014  Guideline(s) Used:  Foot Pain  Disposition Per Guideline:   See Within 3 Days in Office  Reason For Disposition Reached:   Numbness or tingling in feet and new or increased  Advice Given:  Call Back If:  You become worse.  Patient Will Follow Care Advice:  YES

## 2014-03-06 NOTE — Telephone Encounter (Signed)
Noted  

## 2014-03-15 ENCOUNTER — Encounter: Payer: Self-pay | Admitting: Internal Medicine

## 2014-03-15 ENCOUNTER — Telehealth: Payer: Self-pay | Admitting: *Deleted

## 2014-03-15 ENCOUNTER — Other Ambulatory Visit: Payer: Medicare Other

## 2014-03-15 ENCOUNTER — Ambulatory Visit (INDEPENDENT_AMBULATORY_CARE_PROVIDER_SITE_OTHER): Payer: Medicare Other | Admitting: Internal Medicine

## 2014-03-15 VITALS — BP 120/76 | HR 93 | Temp 98.1°F | Resp 20 | Ht 69.0 in | Wt 211.0 lb

## 2014-03-15 DIAGNOSIS — IMO0002 Reserved for concepts with insufficient information to code with codable children: Secondary | ICD-10-CM

## 2014-03-15 DIAGNOSIS — IMO0001 Reserved for inherently not codable concepts without codable children: Secondary | ICD-10-CM

## 2014-03-15 DIAGNOSIS — Z1329 Encounter for screening for other suspected endocrine disorder: Secondary | ICD-10-CM

## 2014-03-15 DIAGNOSIS — G8929 Other chronic pain: Secondary | ICD-10-CM

## 2014-03-15 DIAGNOSIS — Z13220 Encounter for screening for lipoid disorders: Secondary | ICD-10-CM

## 2014-03-15 DIAGNOSIS — Z136 Encounter for screening for cardiovascular disorders: Secondary | ICD-10-CM

## 2014-03-15 DIAGNOSIS — M130 Polyarthritis, unspecified: Secondary | ICD-10-CM

## 2014-03-15 DIAGNOSIS — M359 Systemic involvement of connective tissue, unspecified: Secondary | ICD-10-CM

## 2014-03-15 DIAGNOSIS — Z79899 Other long term (current) drug therapy: Secondary | ICD-10-CM

## 2014-03-15 DIAGNOSIS — E1165 Type 2 diabetes mellitus with hyperglycemia: Secondary | ICD-10-CM

## 2014-03-15 DIAGNOSIS — Z131 Encounter for screening for diabetes mellitus: Secondary | ICD-10-CM

## 2014-03-15 LAB — LIPID PANEL
CHOL/HDL RATIO: 3
Cholesterol: 182 mg/dL (ref 0–200)
HDL: 60.5 mg/dL (ref >39.00)
LDL Cholesterol: 102 mg/dL — ABNORMAL HIGH (ref 0–99)
TRIGLYCERIDES: 99 mg/dL (ref 0.0–149.0)
VLDL: 19.8 mg/dL (ref 0.0–40.0)

## 2014-03-15 LAB — COMPREHENSIVE METABOLIC PANEL
ALT: 16 U/L (ref 0–35)
AST: 18 U/L (ref 0–37)
Albumin: 4.2 g/dL (ref 3.5–5.2)
Alkaline Phosphatase: 59 U/L (ref 39–117)
BUN: 23 mg/dL (ref 6–23)
CO2: 26 mEq/L (ref 19–32)
Calcium: 9.2 mg/dL (ref 8.4–10.5)
Chloride: 107 mEq/L (ref 96–112)
Creatinine, Ser: 0.9 mg/dL (ref 0.4–1.2)
GFR: 70.62 mL/min (ref >60.00)
Glucose, Bld: 99 mg/dL (ref 70–99)
POTASSIUM: 3.6 meq/L (ref 3.5–5.1)
Sodium: 140 mEq/L (ref 135–145)
Total Bilirubin: 0.8 mg/dL (ref 0.3–1.2)
Total Protein: 6.9 g/dL (ref 6.0–8.3)

## 2014-03-15 LAB — CBC WITH DIFFERENTIAL/PLATELET
BASOS ABS: 0 10*3/uL (ref 0.0–0.1)
Basophils Relative: 0.3 % (ref 0.0–3.0)
Eosinophils Absolute: 0.2 10*3/uL (ref 0.0–0.7)
Eosinophils Relative: 2.6 % (ref 0.0–5.0)
HEMATOCRIT: 41.6 % (ref 36.0–46.0)
Hemoglobin: 13.6 g/dL (ref 12.0–15.0)
LYMPHS ABS: 1.8 10*3/uL (ref 0.7–4.0)
Lymphocytes Relative: 24.4 % (ref 12.0–46.0)
MCHC: 32.7 g/dL (ref 30.0–36.0)
MCV: 92.9 fl (ref 78.0–100.0)
MONO ABS: 0.5 10*3/uL (ref 0.1–1.0)
Monocytes Relative: 6.7 % (ref 3.0–12.0)
NEUTROS PCT: 66 % (ref 43.0–77.0)
Neutro Abs: 5 10*3/uL (ref 1.4–7.7)
Platelets: 192 10*3/uL (ref 150.0–400.0)
RBC: 4.48 Mil/uL (ref 3.87–5.11)
RDW: 16.6 % — AB (ref 11.5–14.6)
WBC: 7.6 10*3/uL (ref 4.5–10.5)

## 2014-03-15 LAB — TSH: TSH: 2.21 u[IU]/mL (ref 0.35–5.50)

## 2014-03-15 LAB — HIGH SENSITIVITY CRP: CRP HIGH SENSITIVITY: 5.09 mg/L — AB (ref 0.000–5.000)

## 2014-03-15 LAB — ALT: ALT: 16 U/L (ref 0–35)

## 2014-03-15 MED ORDER — TOPIRAMATE 100 MG PO TABS: ORAL_TABLET | ORAL | Status: DC

## 2014-03-15 MED ORDER — LEVOTHYROXINE SODIUM 25 MCG PO TABS: 25.0000 ug | ORAL_TABLET | Freq: Every day | ORAL | Status: DC

## 2014-03-15 NOTE — Progress Notes (Signed)
Pre-visit discussion using our clinic review tool. No additional management support is needed unless otherwise documented below in the visit note.  

## 2014-03-15 NOTE — Patient Instructions (Signed)
Limit your sodium (Salt) intake  You need to lose weight.  Consider a lower calorie diet and regular exercise.    It is important that you exercise regularly, at least 20 minutes 3 to 4 times per week.  If you develop chest pain or shortness of breath seek  medical attention.  Rheumatology followup   Please check your hemoglobin A1c every 3 months

## 2014-03-15 NOTE — Progress Notes (Signed)
Subjective:    [name], female    DOB: 10-10-1966, 48 y.o.   MRN: [id number]  HPI  Wt Readings from Last 3 Encounters:  03/15/14 211 lb (95.709 kg)  09/27/13 209 lb (94.802 kg)  09/07/13 209 lb (94.62 kg)   48 year old patient who is followed closely by rheumatology due to polyarthritis and chronic pain syndrome.  She also has a history of mononeuritis multiplex.  Complaints today include a two-week history of paroxysmal sharp pains involving both feet are also associated with periods of numbness.  Symptoms seem paroxysmal and frequently is bothersome at night.  She has diabetes, which has been stable.  She remains on low-dose prednisone, as well as analgesics. She was seen earlier today for laboratory draw and will be followed up by rheumatology next month.  Past Medical History  Diagnosis Date  . ANXIETY 06/06/2007  . BACK PAIN 02/22/2009  . DEPRESSION 06/06/2007  . FREQUENCY, URINARY 07/07/2007  . GERD 06/06/2007  . HYPOTHYROIDISM 06/06/2007  . INTERSTITIAL CYSTITIS 09/11/2010  . NEPHROLITHIASIS, HX OF 11/05/2010  . OSTEOPENIA 10/14/2009  . OVERACTIVE BLADDER 10/14/2009  . PAIN, CHRONIC NEC 06/06/2007  . POLYARTHRITIS 08/19/2007  . 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 WEIGHT GAIN 02/22/2009  . Diabetes mellitus without complication   . Lupus   . Cervical cancer     History   Social History  . Marital Status: Divorced    Spouse Name: N/A    Number of Children: 1  . Years of Education: N/A   Occupational History  . DISABLED    Social History Main Topics  . Smoking status: Never Smoker   . Smokeless tobacco: Never Used  . Alcohol Use: No  . Drug Use: No  . Sexual Activity: Not on file   Other Topics Concern  . Not on file   Social History Narrative  . No narrative on file    Past Surgical History  Procedure Laterality Date  . Appendectomy    . Tonsillectomy    . Wrist reconstruction    . Revision total hip arthroplasty  06-2009  .  Breast surgery      Family History  Problem Relation Age of Onset  . Hypertension Mother   . Arthritis Mother   . Asthma Sister   . Asthma Daughter   . Diabetes Maternal Grandmother   . Heart disease Maternal Grandmother   . Colon cancer Maternal Uncle 41  . Pancreatic cancer Father   . Crohn's disease Maternal Aunt   . Diabetes Maternal Aunt     Allergies  Allergen Reactions  . Clarithromycin   . Dhea [Nutritional Supplements]   . Fish Allergy   . Latex   . Penicillins   . Prasterone   . Promethazine Hcl   . Hydromorphone Hcl Rash    "Rash all over"    Current Outpatient Prescriptions on File Prior to Visit  Medication Sig Dispense Refill  . ABILIFY 15 MG tablet Take 15 mg by mouth daily.       Marland Kitchen albuterol (PROVENTIL HFA) 108 (90 BASE) MCG/ACT inhaler Inhale 2 puffs into the lungs every 4 (four) hours as needed for wheezing or shortness of breath.       . Ascorbic Acid (VITAMIN C GUMMIE PO) Take 4 tablets by mouth every morning.      Marland Kitchen aspirin 81 MG tablet Take 81 mg by mouth daily.      Marland Kitchen azaTHIOprine (IMURAN) 50 MG tablet Take 1.5 tablets (75  mg total) by mouth daily.  90 tablet  0  . Calcium-Phosphorus-Vitamin D (CALCIUM GUMMIES PO) Take 2 tablets by mouth every morning.      . Cholecalciferol (VITAMIN D) 2000 UNITS CAPS Take 2,000 Units by mouth every morning.       . clorazepate (TRANXENE) 7.5 MG tablet Take 7.5 mg by mouth at bedtime as needed.       . dapsone 25 MG tablet Take 25 mg by mouth every morning.       . diclofenac sodium (VOLTAREN) 1 % GEL Apply 2 g topically 4 (four) times daily.      . DULoxetine (CYMBALTA) 60 MG capsule Take 60 mg by mouth 2 (two) times daily.      . folic acid (FOLVITE) 1 MG tablet Take 1 mg by mouth daily.      Marland Kitchen LORazepam (ATIVAN) 1 MG tablet Take 1-2 mg by mouth 3 (three) times daily. 1mg  am and afternoon and 2 mg at bedtime      . metaxalone (SKELAXIN) 800 MG tablet Take 800 mg by mouth 3 (three) times daily as needed for pain.       . metFORMIN (GLUCOPHAGE-XR) 500 MG 24 hr tablet Take 500 mg by mouth 2 (two) times daily.      . methotrexate 25 MG/ML injection Inject 25 mg into the skin once a week. On saturdays      . Multiple Vitamins-Minerals (HM MULTIVITAMIN ADULT GUMMY PO) Take 2 tablets by mouth daily.      Marland Kitchen oxyCODONE-acetaminophen (PERCOCET) 10-325 MG per tablet Take 1 tablet by mouth every 6 (six) hours as needed for pain.       . pantoprazole (PROTONIX) 40 MG tablet Take one tablet by mouth twice daily  180 tablet  3  . predniSONE (DELTASONE) 10 MG tablet Take 10 mg by mouth daily.      . valACYclovir (VALTREX) 500 MG tablet Take 500 mg by mouth at bedtime.        No current facility-administered medications on file prior to visit.    BP 120/76  Pulse 93  Temp(Src) 98.1 F (36.7 C) (Oral)  Resp 20  Ht 5\' 9"  (1.753 m)  Wt 211 lb (95.709 kg)  BMI 31.15 kg/m2  SpO2 98%      Review of Systems  Constitutional: Negative.   HENT: Negative for congestion, dental problem, hearing loss, rhinorrhea, sinus pressure, sore throat and tinnitus.   Eyes: Negative for pain, discharge and visual disturbance.  Respiratory: Negative for cough and shortness of breath.   Cardiovascular: Negative for chest pain, palpitations and leg swelling.  Gastrointestinal: Negative for nausea, vomiting, abdominal pain, diarrhea, constipation, blood in stool and abdominal distention.  Genitourinary: Negative for dysuria, urgency, frequency, hematuria, flank pain, vaginal bleeding, vaginal discharge, difficulty urinating, vaginal pain and pelvic pain.  Musculoskeletal: Positive for arthralgias. Negative for gait problem and joint swelling.  Skin: Negative for rash.  Neurological: Positive for numbness. Negative for dizziness, syncope, speech difficulty, weakness and headaches.  Hematological: Negative for adenopathy.  Psychiatric/Behavioral: Negative for behavioral problems, dysphoric mood and agitation. The patient is not  nervous/anxious.        Objective:   Physical Exam  Constitutional: She is oriented to person, place, and time. She appears well-developed and well-nourished. No distress.  Overweight no distress Repeat blood pressure 120/70   HENT:  Head: Normocephalic.  Right Ear: External ear normal.  Left Ear: External ear normal.  Mouth/Throat: Oropharynx is clear and moist.  Eyes: Conjunctivae and EOM are normal. Pupils are equal, round, and reactive to light.  Neck: Normal range of motion. Neck supple. No thyromegaly present.  Cardiovascular: Normal rate, regular rhythm, normal heart sounds and intact distal pulses.   Pulmonary/Chest: Effort normal and breath sounds normal.  Abdominal: Soft. Bowel sounds are normal. She exhibits no mass. There is no tenderness.  Musculoskeletal: Normal range of motion.  Lymphadenopathy:    She has no cervical adenopathy.  Neurological: She is alert and oriented to person, place, and time.  Patellar and Achilles reflexes were brisk Decreased sensation to monofilament over the anterior lower leg, but intact over the plantar and dorsal aspect of the foot  Skin: Skin is warm and dry. No rash noted.  Psychiatric: She has a normal mood and affect. Her behavior is normal.          Assessment & Plan:  Bilateral dysesthesias of the feet.  Unclear etiology.  Symptoms seem fairly mild.  She is on a number of the medications for neuropathic pain.  She is scheduled to see rheumatology next month.  Will observe at this time.  Laboratory screen obtained earlier this morning.  Diabetes, stable

## 2014-03-15 NOTE — Telephone Encounter (Signed)
Nikea from [facility] called pt needs refills on Topamax and Levothyroxine, pt does not have Rx's that were printed she did not get them. Told Nikea okay, verbal orders given for Topamax and Levothyroxine.

## 2014-03-16 ENCOUNTER — Emergency Department (HOSPITAL_COMMUNITY): Payer: Medicare Other

## 2014-03-16 ENCOUNTER — Other Ambulatory Visit: Payer: Self-pay | Admitting: *Deleted

## 2014-03-16 ENCOUNTER — Emergency Department (HOSPITAL_COMMUNITY)
Admission: EM | Admit: 2014-03-16 | Discharge: 2014-03-16 | Disposition: A | Discharge status: Home / Self Care | Payer: Medicare Other | Attending: Emergency Medicine | Admitting: Emergency Medicine

## 2014-03-16 DIAGNOSIS — X500XXA Overexertion from strenuous movement or load, initial encounter: Secondary | ICD-10-CM | POA: Insufficient documentation

## 2014-03-16 DIAGNOSIS — S59919A Unspecified injury of unspecified forearm, initial encounter: Principal | ICD-10-CM

## 2014-03-16 DIAGNOSIS — Z87442 Personal history of urinary calculi: Secondary | ICD-10-CM | POA: Insufficient documentation

## 2014-03-16 DIAGNOSIS — Z91040 Latex allergy status: Secondary | ICD-10-CM | POA: Insufficient documentation

## 2014-03-16 DIAGNOSIS — Z8541 Personal history of malignant neoplasm of cervix uteri: Secondary | ICD-10-CM | POA: Insufficient documentation

## 2014-03-16 DIAGNOSIS — K219 Gastro-esophageal reflux disease without esophagitis: Secondary | ICD-10-CM | POA: Insufficient documentation

## 2014-03-16 DIAGNOSIS — Z79899 Other long term (current) drug therapy: Secondary | ICD-10-CM | POA: Insufficient documentation

## 2014-03-16 DIAGNOSIS — Y9389 Activity, other specified: Secondary | ICD-10-CM | POA: Insufficient documentation

## 2014-03-16 DIAGNOSIS — Y929 Unspecified place or not applicable: Secondary | ICD-10-CM | POA: Insufficient documentation

## 2014-03-16 DIAGNOSIS — F329 Major depressive disorder, single episode, unspecified: Secondary | ICD-10-CM | POA: Insufficient documentation

## 2014-03-16 DIAGNOSIS — M130 Polyarthritis, unspecified: Secondary | ICD-10-CM | POA: Insufficient documentation

## 2014-03-16 DIAGNOSIS — E039 Hypothyroidism, unspecified: Secondary | ICD-10-CM | POA: Insufficient documentation

## 2014-03-16 DIAGNOSIS — R209 Unspecified disturbances of skin sensation: Secondary | ICD-10-CM | POA: Insufficient documentation

## 2014-03-16 DIAGNOSIS — F3289 Other specified depressive episodes: Secondary | ICD-10-CM | POA: Insufficient documentation

## 2014-03-16 DIAGNOSIS — F411 Generalized anxiety disorder: Secondary | ICD-10-CM | POA: Insufficient documentation

## 2014-03-16 DIAGNOSIS — G8929 Other chronic pain: Secondary | ICD-10-CM | POA: Insufficient documentation

## 2014-03-16 DIAGNOSIS — S59909A Unspecified injury of unspecified elbow, initial encounter: Secondary | ICD-10-CM | POA: Insufficient documentation

## 2014-03-16 DIAGNOSIS — IMO0002 Reserved for concepts with insufficient information to code with codable children: Secondary | ICD-10-CM | POA: Insufficient documentation

## 2014-03-16 DIAGNOSIS — M329 Systemic lupus erythematosus, unspecified: Secondary | ICD-10-CM | POA: Insufficient documentation

## 2014-03-16 DIAGNOSIS — S6990XA Unspecified injury of unspecified wrist, hand and finger(s), initial encounter: Secondary | ICD-10-CM | POA: Insufficient documentation

## 2014-03-16 DIAGNOSIS — S6992XA Unspecified injury of left wrist, hand and finger(s), initial encounter: Secondary | ICD-10-CM

## 2014-03-16 DIAGNOSIS — Z880 Allergy status to penicillin: Secondary | ICD-10-CM | POA: Insufficient documentation

## 2014-03-16 DIAGNOSIS — Z87448 Personal history of other diseases of urinary system: Secondary | ICD-10-CM | POA: Insufficient documentation

## 2014-03-16 DIAGNOSIS — E119 Type 2 diabetes mellitus without complications: Secondary | ICD-10-CM | POA: Insufficient documentation

## 2014-03-16 DIAGNOSIS — Z7982 Long term (current) use of aspirin: Secondary | ICD-10-CM | POA: Insufficient documentation

## 2014-03-16 NOTE — ED Provider Notes (Signed)
CSN: [id number]     Arrival date & time 03/16/14  1712 History  This chart was scribed for non-physician practitioner Alvina Chou, working with Blanchie Dessert, MD by Donato Schultz, ED Scribe. This patient was seen in room [place] and the patient's care was started at 6:45 PM.    Chief Complaint  Patient presents with  . Hand Pain  . Numbness    Patient is a 48 y.o. female presenting with hand pain. The history is provided by the patient. No language interpreter was used.  Hand Pain   HPI Comments: [name] [name] is a 48 y.o. female who presents to the Emergency Department complaining of constant left wrist pain that started today.  The patient states that she moved her wrist and heard and felt a popping sound.  She states that the pain started immediately after the pop and she noticed that her fingers on her left hand went numb.  She states that over time she noticed some mild swelling of her left fingers and the numbness was getting worse.  She states that she called her orthopedist Dr. Gerrit Heck about her symptoms and was told that he was out of town until Monday and advised to report to the ED.  The patient states that she is unable to hold the steering wheel while driving, buckle her belt, or tie her shoes.  The patient denies taking any medication for her symptoms.  The patient states that she is allergic to Dilaudid and has a pain management contract.  She states that she is currently wearing a pain patch and has Percocet at home.     Past Medical History  Diagnosis Date  . ANXIETY 06/06/2007  . BACK PAIN 02/22/2009  . DEPRESSION 06/06/2007  . FREQUENCY, URINARY 07/07/2007  . GERD 06/06/2007  . HYPOTHYROIDISM 06/06/2007  . INTERSTITIAL CYSTITIS 09/11/2010  . NEPHROLITHIASIS, HX OF 11/05/2010  . OSTEOPENIA 10/14/2009  . OVERACTIVE BLADDER 10/14/2009  . PAIN, CHRONIC NEC 06/06/2007  . POLYARTHRITIS 08/19/2007  . 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 WEIGHT GAIN 02/22/2009  .  Diabetes mellitus without complication   . Lupus   . Cervical cancer    Past Surgical History  Procedure Laterality Date  . Appendectomy    . Tonsillectomy    . Wrist reconstruction    . Revision total hip arthroplasty  06-2009  . Breast surgery     Family History  Problem Relation Age of Onset  . Hypertension Mother   . Arthritis Mother   . Asthma Sister   . Asthma Daughter   . Diabetes Maternal Grandmother   . Heart disease Maternal Grandmother   . Colon cancer Maternal Uncle 73  . Pancreatic cancer Father   . Crohn's disease Maternal Aunt   . Diabetes Maternal Aunt    History  Substance Use Topics  . Smoking status: Never Smoker   . Smokeless tobacco: Never Used  . Alcohol Use: No   OB History   Grav Para Term Preterm Abortions TAB SAB Ect Mult Living                 Review of Systems  Musculoskeletal: Positive for arthralgias (left wrist).  Neurological: Positive for numbness (left fingers).  All other systems reviewed and are negative.     Allergies  Clarithromycin; Dhea; Fish allergy; Latex; Penicillins; Prasterone; Promethazine hcl; and Hydromorphone hcl  Home Medications   Prior to Admission medications   Medication Sig Start Date End Date Taking? Authorizing Provider  ABILIFY 15  MG tablet Take 15 mg by mouth daily.  09/18/13   Historical Provider, MD  albuterol (PROVENTIL HFA) 108 (90 BASE) MCG/ACT inhaler Inhale 2 puffs into the lungs every 4 (four) hours as needed for wheezing or shortness of breath.     Historical Provider, MD  Ascorbic Acid (VITAMIN C GUMMIE PO) Take 4 tablets by mouth every morning.    Historical Provider, MD  aspirin 81 MG tablet Take 81 mg by mouth daily.    Historical Provider, MD  azaTHIOprine (IMURAN) 50 MG tablet Take 1.5 tablets (75 mg total) by mouth daily. 09/27/13   Marletta Lor, MD  Calcium-Phosphorus-Vitamin D (CALCIUM GUMMIES PO) Take 2 tablets by mouth every morning.    Historical Provider, MD  Cholecalciferol  (VITAMIN D) 2000 UNITS CAPS Take 2,000 Units by mouth every morning.     Historical Provider, MD  clorazepate (TRANXENE) 7.5 MG tablet Take 7.5 mg by mouth at bedtime as needed.  09/20/13   Historical Provider, MD  dapsone 25 MG tablet Take 25 mg by mouth every morning.     Historical Provider, MD  diclofenac sodium (VOLTAREN) 1 % GEL Apply 2 g topically 4 (four) times daily.    Historical Provider, MD  DULoxetine (CYMBALTA) 60 MG capsule Take 60 mg by mouth 2 (two) times daily.    Historical Provider, MD  fentaNYL (DURAGESIC - DOSED MCG/HR) 75 MCG/HR Place 75 mcg onto the skin every 3 (three) days.    Historical Provider, MD  folic acid (FOLVITE) 1 MG tablet Take 1 mg by mouth daily.    Historical Provider, MD  levothyroxine (SYNTHROID, LEVOTHROID) 25 MCG tablet Take 1 tablet (25 mcg total) by mouth daily before breakfast. 03/15/14   Marletta Lor, MD  LORazepam (ATIVAN) 1 MG tablet Take 1-2 mg by mouth 3 (three) times daily. 1mg  am and afternoon and 2 mg at bedtime    Historical Provider, MD  metaxalone (SKELAXIN) 800 MG tablet Take 800 mg by mouth 3 (three) times daily as needed for pain.    Historical Provider, MD  metFORMIN (GLUCOPHAGE-XR) 500 MG 24 hr tablet Take 500 mg by mouth 2 (two) times daily.    Historical Provider, MD  methotrexate 25 MG/ML injection Inject 25 mg into the skin once a week. On saturdays 08/23/13   Historical Provider, MD  methylphenidate (CONCERTA) 27 MG CR tablet Take 27 mg by mouth every morning.    Historical Provider, MD  Multiple Vitamins-Minerals (HM MULTIVITAMIN ADULT GUMMY PO) Take 2 tablets by mouth daily.    Historical Provider, MD  oxyCODONE-acetaminophen (PERCOCET) 10-325 MG per tablet Take 1 tablet by mouth every 6 (six) hours as needed for pain.     Historical Provider, MD  pantoprazole (PROTONIX) 40 MG tablet Take one tablet by mouth twice daily 12/18/13   Marletta Lor, MD  predniSONE (DELTASONE) 10 MG tablet Take 10 mg by mouth daily.     Historical Provider, MD  topiramate (TOPAMAX) 100 MG tablet Take one tablet by mouth twice daily 03/15/14   Marletta Lor, MD  valACYclovir (VALTREX) 500 MG tablet Take 500 mg by mouth at bedtime.  08/26/12   Historical Provider, MD   Triage Vitals: BP 124/64  Pulse 113  Temp(Src) 98.5 F (36.9 C) (Oral)  Resp 16  SpO2 99%  Physical Exam  Nursing note and vitals reviewed. Constitutional: She is oriented to person, place, and time. She appears well-developed and well-nourished.  HENT:  Head: Normocephalic and atraumatic.  Eyes:  EOM are normal.  Neck: Normal range of motion.  Cardiovascular: Normal rate.   Pulmonary/Chest: Effort normal.  Musculoskeletal: Normal range of motion.  Left wrist limited range of motion due to pain. No obvious deformity. Tenderness to palpation over ulnar aspect of wrist.  No snuff box tenderness.    Neurological: She is alert and oriented to person, place, and time.  Sensation in tact of distal fingers  Skin: Skin is warm and dry.  Psychiatric: She has a normal mood and affect. Her behavior is normal.    ED Course  Procedures (including critical care time)  SPLINT APPLICATION Date/Time: 0:34 PM Authorized by: Alvina Chou Consent: Verbal consent obtained. Risks and benefits: risks, benefits and alternatives were discussed Consent given by: patient Splint applied by: orthopedic technician Location details: left wrist Splint type: velcro Post-procedure: The splinted body part was neurovascularly unchanged following the procedure. Patient tolerance: Patient tolerated the procedure well with no immediate complications.     DIAGNOSTIC STUDIES: Oxygen Saturation is 99% on room air, normal by my interpretation.    COORDINATION OF CARE: 6:49 PM- Discussed x-ray results with the patient that did not reveal any fractures.  Discussed placing the patient in a wrist splint.  The patient agreed to the treatment plan.  Labs Review Labs  Reviewed - No data to display  Imaging Review Dg Wrist Complete Left  03/16/2014   CLINICAL DATA:  Left wrist pain.  EXAM: LEFT WRIST - COMPLETE 3+ VIEW  COMPARISON:  None.  FINDINGS: There is no evidence of fracture or dislocation. There is no evidence of arthropathy. Evidence for old screw tracts in the distal radius and ulna. Soft tissues are unremarkable.  IMPRESSION: No fracture or dislocation.   Electronically Signed   By: Rolla Flatten M.D.   On: 03/16/2014 18:35     EKG Interpretation None      MDM   Final diagnoses:  Left wrist injury    6:54 PM Patient's xray unremarkable for acute changes. Patient has a pain contract and is driving home so she will not receive narcotic pain medication or a prescription. No neurovascular compromise. Patient will have a wrist splint and will follow up with Dr. Amedeo Plenty in 3 days.   I personally performed the services described in this documentation, which was scribed in my presence. The recorded information has been reviewed and is accurate.    Alvina Chou, [place] 03/16/14 1933

## 2014-03-16 NOTE — Discharge Instructions (Signed)
Apply ice to the injury. Rest, ice, and elevate your wrist. Follow up with Dr. Amedeo Plenty.

## 2014-03-16 NOTE — ED Notes (Signed)
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

## 2014-03-16 NOTE — Telephone Encounter (Signed)
Pharmacy updated.

## 2014-03-16 NOTE — ED Provider Notes (Signed)
Medical screening examination/treatment/procedure(s) were performed by non-physician practitioner and as supervising physician I was immediately available for consultation/collaboration.   EKG Interpretation None        Blanchie Dessert, MD 03/16/14 2136

## 2014-03-22 ENCOUNTER — Telehealth: Payer: Self-pay | Admitting: Internal Medicine

## 2014-03-22 DIAGNOSIS — R200 Anesthesia of skin: Secondary | ICD-10-CM

## 2014-03-22 DIAGNOSIS — R202 Paresthesia of skin: Principal | ICD-10-CM

## 2014-03-22 NOTE — Telephone Encounter (Signed)
Please see message and advise 

## 2014-03-22 NOTE — Telephone Encounter (Signed)
Pt states she was seen by Dr. Amedeo Plenty ([facility]) today for numbness in hand.  She states Dr. Amedeo Plenty does not feel she has re injured her wrist and does not think the numbness is related to her previous wrist injury.  Pt states she has numbness and pain in both hands now that radiates to her elbow and she also has numbness in her feet.  Patient rates the pain at 9 and says the pain meds do not help her.    Pt suggests she be referred to neurology for evaluation.  She states she has also called and left a message with her rheumatologist in [place], [place] to see if he wants to increase her prednisone dosage.

## 2014-03-22 NOTE — Telephone Encounter (Signed)
Spoke to pt told her Dr. Raliegh Ip said okay to do referral to Neuro, order sent and someone will be contacting you. Pt verbalized understanding.

## 2014-03-22 NOTE — Telephone Encounter (Signed)
Okay to refer to neurology

## 2014-05-02 ENCOUNTER — Ambulatory Visit (INDEPENDENT_AMBULATORY_CARE_PROVIDER_SITE_OTHER): Payer: Medicare Other | Admitting: Neurology

## 2014-05-02 ENCOUNTER — Encounter: Payer: Self-pay | Admitting: Neurology

## 2014-05-02 VITALS — BP 110/68 | HR 85 | Ht 68.5 in | Wt 203.0 lb

## 2014-05-02 DIAGNOSIS — G959 Disease of spinal cord, unspecified: Secondary | ICD-10-CM

## 2014-05-02 DIAGNOSIS — M329 Systemic lupus erythematosus, unspecified: Secondary | ICD-10-CM

## 2014-05-02 DIAGNOSIS — R202 Paresthesia of skin: Secondary | ICD-10-CM

## 2014-05-02 DIAGNOSIS — R209 Unspecified disturbances of skin sensation: Secondary | ICD-10-CM

## 2014-05-02 NOTE — Progress Notes (Signed)
[facility] Clinic Note - Initial Visit   Date: 05/02/2014  Haliey Romberg MRN: [id number] DOB: Jul 14, 1966   Dear Dr Burnice Logan:  Thank you for your kind referral of Xaniyah [name] for consultation of paresthesias. Although his history is well known to you, please allow [place] to reiterate it for the purpose of our medical record. The patient was accompanied to the clinic by self.    History of Present Illness: [name] [name] is a 48 y.o. 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    Starting in March 2015, she developed intermittent bilateral feet numbness worse at night and would wake her from sleeping.  Over a month, symptoms migrated to her left shoulder and hand.  She saw orthopaedic surgery, Dr. Gerrit Heck, who recommended using the wrist splint.  She also saw her rheumatologist, Dr. Johnna Acosta in [place], [place] and was given steroid injection which improved numbness of her feet and shoulder, but hand paresthesias remained unchanged. He also increased her prednisone to 20mg  and recently tapered it to 10mg  (04/20/2014) without worsening of symptoms. She has been wearing a wrist splint for the past month, which has helped her hand swelling.  Hand numbness is constant and involves her first three fingers.  It is worse with finger manipulations and not improved by anything.    She denies any neck or back pain. She has chronic polyarthralgias from lupus and is taking percocet and fentanyl 101mcg patch.  No weakness of the hands or feet.  She has a long history of bilateral hand tremors which has become worse.   She reports having  history of left optic neuritis (2006) treated with IV steroids.  MRI brain and several lumbar punctures was negative for demyelinating changes. In 2007, she had mononeuritis multiplex with residual left lateral leg sensory loss and gait abnormalities.  EMG was done at [facility] in [place], [place] (results not available).  She has been ambulating with a cane since 2006.  Out-side paper records, electronic medical record, and images have been reviewed where available and summarized as:  Labs 12/11/2013:  C3 113, C4 25  Lab Results  Component Value Date   TSH 2.21 03/15/2014      Past Medical History  Diagnosis Date  . ANXIETY 06/06/2007  . BACK PAIN 02/22/2009  . DEPRESSION 06/06/2007  . FREQUENCY, URINARY 07/07/2007  . GERD 06/06/2007  . HYPOTHYROIDISM 06/06/2007  . INTERSTITIAL CYSTITIS 09/11/2010  . NEPHROLITHIASIS, HX OF 11/05/2010  . OSTEOPENIA 10/14/2009  . OVERACTIVE BLADDER 10/14/2009  . PAIN, CHRONIC NEC 06/06/2007  . POLYARTHRITIS 08/19/2007  . 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 WEIGHT GAIN 02/22/2009  . Diabetes mellitus without complication   . Lupus 2006  . Cervical cancer     Past Surgical History  Procedure Laterality Date  . Appendectomy    . Tonsillectomy    . Wrist reconstruction    . Revision total hip arthroplasty  06-2009  . Breast surgery       Medications:  Current Outpatient Prescriptions on File Prior to Visit  Medication Sig Dispense Refill  . ABILIFY 15 MG tablet Take 15 mg by mouth daily.       Marland Kitchen albuterol (PROVENTIL HFA) 108 (90 BASE) MCG/ACT inhaler Inhale 2 puffs into  the lungs every 4 (four) hours as needed for wheezing or shortness of breath.       . Ascorbic Acid (VITAMIN C GUMMIE PO) Take 4 tablets by mouth every morning.      Marland Kitchen aspirin 81 MG tablet Take 81 mg by mouth daily.      . Calcium-Phosphorus-Vitamin D (CALCIUM GUMMIES PO) Take 2 tablets by mouth every morning.      . Cholecalciferol (VITAMIN D) 2000 UNITS CAPS Take 2,000 Units  by mouth every morning.       . dapsone 25 MG tablet Take 25 mg by mouth every morning.       . diclofenac sodium (VOLTAREN) 1 % GEL Apply 2 g topically 4 (four) times daily.      . DULoxetine (CYMBALTA) 60 MG capsule Take 60 mg by mouth 2 (two) times daily.      . fentaNYL (DURAGESIC - DOSED MCG/HR) 75 MCG/HR Place 75 mcg onto the skin every 3 (three) days.      . folic acid (FOLVITE) 1 MG tablet Take 1 mg by mouth daily.      Marland Kitchen levothyroxine (SYNTHROID, LEVOTHROID) 25 MCG tablet Take 1 tablet (25 mcg total) by mouth daily before breakfast.  90 tablet  1  . LORazepam (ATIVAN) 1 MG tablet Take 1-2 mg by mouth 3 (three) times daily. 1mg  am and afternoon and 2 mg at bedtime      . metaxalone (SKELAXIN) 800 MG tablet Take 800 mg by mouth 3 (three) times daily as needed for pain.      . metFORMIN (GLUCOPHAGE-XR) 500 MG 24 hr tablet Take 500 mg by mouth 2 (two) times daily.      . methotrexate 25 MG/ML injection Inject 25 mg into the skin once a week. On saturdays      . methylphenidate (CONCERTA) 27 MG CR tablet Take 27 mg by mouth every morning.      . Multiple Vitamins-Minerals (HM MULTIVITAMIN ADULT GUMMY PO) Take 2 tablets by mouth daily.      Marland Kitchen oxyCODONE-acetaminophen (PERCOCET) 10-325 MG per tablet Take 1 tablet by mouth every 6 (six) hours as needed for pain.       . pantoprazole (PROTONIX) 40 MG tablet Take one tablet by mouth twice daily  180 tablet  3  . predniSONE (DELTASONE) 10 MG tablet Take 10 mg by mouth daily.      Marland Kitchen topiramate (TOPAMAX) 100 MG tablet Take one tablet by mouth twice daily  180 tablet  1  . valACYclovir (VALTREX) 500 MG tablet Take 500 mg by mouth at bedtime.        No current facility-administered medications on file prior to visit.    Allergies:  Allergies  Allergen Reactions  . Clarithromycin   . Dhea [Nutritional Supplements]   . Fish Allergy   . Latex   . Penicillins   . Prasterone   . Promethazine Hcl   . Hydromorphone Hcl Rash    "Rash all over"     Family History: Family History  Problem Relation Age of Onset  . Hypertension Mother   . Arthritis Mother   . Asthma Sister   . Asthma Daughter   . Diabetes Maternal Grandmother   . Heart disease Maternal Grandmother   . Colon cancer Maternal Uncle 64  . Pancreatic cancer Father   . Crohn's disease Maternal Aunt   . Diabetes Maternal Aunt     Social History: History   Social History  . Marital Status:  Divorced    Spouse Name: N/A    Number of Children: 1  . Years of Education: N/A   Occupational History  . DISABLED    Social History Main Topics  . Smoking status: Never Smoker   . Smokeless tobacco: Never Used  . Alcohol Use: No  . Drug Use: No  . Sexual Activity: Not on file   Other Topics Concern  . 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   She lives with daughter (44) and mother (68).          Review of Systems:  CONSTITUTIONAL: No fevers, chills, night sweats, or weight loss.   EYES: +visual changes or eye pain ENT: No hearing changes.  No history of nose bleeds.   RESPIRATORY: No cough, wheezing and shortness of breath.   CARDIOVASCULAR: Negative for chest pain, and palpitations.   GI: Negative for abdominal discomfort, blood in stools or black stools.  No recent change in bowel habits.   GU:  No history of incontinence.   MUSCLOSKELETAL: +history of joint pain or swelling.  +myalgias.   SKIN: Negative for lesions, rash, and itching.   HEMATOLOGY/ONCOLOGY: Negative for prolonged bleeding, bruising easily, and swollen nodes.   ENDOCRINE: Negative for cold or heat intolerance, polydipsia or goiter.   PSYCH:  +depression or anxiety symptoms.   NEURO: As Above.   Vital Signs:  BP 110/68  Pulse 85  Ht 5' 8.5" (1.74 m)  Wt 203 lb (92.08 kg)  BMI 30.41 kg/m2  SpO2 94%   General Medical Exam:   General:  Well appearing, comfortable.   Eyes/ENT: see cranial nerve  examination.   Neck: No masses appreciated.  Full range of motion without tenderness.  No carotid bruits. Respiratory:  Clear to auscultation, good air entry bilaterally.   Cardiac:  Regular rate and rhythm, no murmur.    Back:  No pain to palpation of spinous processes.   Extremities:  No deformities, edema, or skin discoloration. Good capillary refill.   Skin:  Skin color, texture, turgor normal. No rashes or lesions.  Neurological Exam: MENTAL STATUS including orientation to time, place, person, recent and remote memory, attention span and concentration, language, and fund of knowledge is normal.  Speech is not dysarthric.  CRANIAL NERVES: II:  No visual field defects.  Unremarkable fundi.   III-IV-VI: Pupils equal round and reactive to light.  Normal conjugate, extra-ocular eye movements in all directions of gaze.  No nystagmus.   V:  Normal facial sensation.   VII:  Normal facial symmetry and movements.  No pathologic facial reflexes.  VIII:  Normal hearing and vestibular function.   IX-X:  Normal palatal movement.   XI:  Normal shoulder shrug and head rotation.   XII:  Normal tongue strength and range of motion, no deviation or fasciculation.  MOTOR:  Bilateral hand and feet (R >L) postural tremor.  No atrophy or fasciculations.  No pronator drift.    Right Upper Extremity:    Left Upper Extremity:    Deltoid  5/5   Deltoid  5/5   Biceps  5/5   Biceps  5/5   Triceps  5/5   Triceps  5/5   Wrist extensors  5/5   Wrist extensors  5/5   Wrist flexors  5/5   Wrist flexors  5/5   Finger extensors  5/5   Finger extensors  5/5   Finger  flexors  5/5   Finger flexors  5/5   Dorsal interossei  5/5   Dorsal interossei  5/5   Abductor pollicis  5/5   Abductor pollicis  5/5   Tone (Ashworth scale)  0  Tone (Ashworth scale)  0   Right Lower Extremity:    Left Lower Extremity:    Hip flexors  5/5   Hip flexors  5/5   Hip extensors  5/5   Hip extensors  5/5   Knee flexors  5/5   Knee  flexors  5/5   Knee extensors  5/5   Knee extensors  5/5   Dorsiflexors  5/5   Dorsiflexors  5/5   Plantarflexors  5/5   Plantarflexors  5/5   Toe extensors  5/5   Toe extensors  5/5   Toe flexors  5/5   Toe flexors  5/5   Tone (Ashworth scale)  0+  Tone (Ashworth scale)  0+   MSRs:  Right                                                                 Left brachioradialis 3+  brachioradialis tr  biceps 3+  biceps 2+  triceps 3+  triceps 2+  patellar 3+  patellar 3+  ankle jerk 2+  ankle jerk 2+  Hoffman no  Hoffman no  plantar response down  plantar response down   SENSORY:  Reduced pin prick over the left lateral lower leg.  Normal and symmetric perception of light touch, vibration, and proprioception.  Romberg's sign absent.   COORDINATION/GAIT: Normal finger-to- nose-finger.  Finger tapping and heel tapping reduced in amplitude and speed. Gait is wide-based, antalgic, and appears slightly spastic.   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  The distribution of her left hand symptoms is most suggestive of carpal tunnel syndrome.  However, her transient bilateral feet and left shoulder paresthesias is concerning for a length-independent polyneuropathy, so EMG of the left arm and leg will be ordered.    She has history of several neurological conditions due to lupus including left optic neuritis and mononeuritis multiplex (exam only notable for sensory loss over left superficial peroneal nerve distribution).  Her exam is also notable for brisk reflexes throughout (R >L), except trace at left C6 and slightly increased tone in the legs with spastic gait pattern. MRI cervical spine will be ordered to evaluate for myelopathy. Additional imaging of the neuroaxis may be indicated based on the results of testing.  Individuals with lupus can develop myelopathy and  neuromyelitis optica, so this needs to be considered going forward.  I have requested her previous neurological work-up to be forward to my office for review.  Return to clinic in 6 weeks, or sooner as needed.    The duration of this appointment visit was 45 minutes of face-to-face time with the patient.  Greater than 50% of this time was spent in counseling, explanation of diagnosis, planning of further management, and coordination of care.   Thank you for allowing me to participate in patient's care.  If I can answer any additional questions, I would be pleased to do so.    Sincerely,  Donika K. Posey Pronto, DO

## 2014-05-02 NOTE — Patient Instructions (Addendum)
1.  MRI cervical wwo contrast 2.  EMG of the left arm and leg 3.  Please forward any previous neurological work-up to my office for review 4.  Return to clinic in 6 weeks

## 2014-05-07 ENCOUNTER — Telehealth: Payer: Self-pay | Admitting: Neurology

## 2014-05-07 ENCOUNTER — Other Ambulatory Visit: Payer: Self-pay | Admitting: *Deleted

## 2014-05-07 DIAGNOSIS — G959 Disease of spinal cord, unspecified: Secondary | ICD-10-CM

## 2014-05-07 DIAGNOSIS — R209 Unspecified disturbances of skin sensation: Secondary | ICD-10-CM

## 2014-05-07 NOTE — Telephone Encounter (Signed)
MRI cervical spine with and without contrast 05/04/2014: Minimal disc bulge is C5-C6 and C6-C7 some bulking ligamentum flavum labrum. This may be exacerbated by extension of the head in the head holder. No significant cord compression. Neuro foramen are patent throughout.  Patient has been informed of the results.  EMG is the next step.  [name] Tuzzolino K. Posey Pronto, DO

## 2014-05-21 ENCOUNTER — Telehealth: Payer: Self-pay | Admitting: Internal Medicine

## 2014-05-21 DIAGNOSIS — Z79899 Other long term (current) drug therapy: Secondary | ICD-10-CM

## 2014-05-21 NOTE — Telephone Encounter (Signed)
Pt is needing an order for labs, CBC, liver function test, C3 and C4 and CRP.

## 2014-05-21 NOTE — Telephone Encounter (Signed)
Ok for labs? 

## 2014-05-21 NOTE — Telephone Encounter (Signed)
Dr.K, please advise if these labs need to be ordered? Last done 02/2014 and 11/2013.

## 2014-05-22 NOTE — Telephone Encounter (Signed)
Please call pt and schedule lab appointment, orders are in EPIC.

## 2014-05-24 NOTE — Telephone Encounter (Signed)
appt scheduled for pt.  

## 2014-05-28 ENCOUNTER — Other Ambulatory Visit (INDEPENDENT_AMBULATORY_CARE_PROVIDER_SITE_OTHER): Payer: Medicare Other

## 2014-05-28 DIAGNOSIS — E119 Type 2 diabetes mellitus without complications: Secondary | ICD-10-CM

## 2014-05-28 DIAGNOSIS — Z79899 Other long term (current) drug therapy: Secondary | ICD-10-CM

## 2014-05-28 LAB — CBC WITH DIFFERENTIAL/PLATELET
BASOS ABS: 0 10*3/uL (ref 0.0–0.1)
BASOS PCT: 0.2 % (ref 0.0–3.0)
EOS ABS: 0.1 10*3/uL (ref 0.0–0.7)
Eosinophils Relative: 1.4 % (ref 0.0–5.0)
HEMATOCRIT: 38 % (ref 36.0–46.0)
HEMOGLOBIN: 12.3 g/dL (ref 12.0–15.0)
LYMPHS ABS: 1.5 10*3/uL (ref 0.7–4.0)
LYMPHS PCT: 19.3 % (ref 12.0–46.0)
MCHC: 32.3 g/dL (ref 30.0–36.0)
MCV: 97.2 fl (ref 78.0–100.0)
MONO ABS: 0.4 10*3/uL (ref 0.1–1.0)
Monocytes Relative: 5.7 % (ref 3.0–12.0)
Neutro Abs: 5.8 10*3/uL (ref 1.4–7.7)
Neutrophils Relative %: 73.4 % (ref 43.0–77.0)
Platelets: 191 10*3/uL (ref 150.0–400.0)
RBC: 3.91 Mil/uL (ref 3.87–5.11)
RDW: 14.9 % (ref 11.5–15.5)
WBC: 7.9 10*3/uL (ref 4.0–10.5)

## 2014-05-28 LAB — HEMOGLOBIN A1C: HEMOGLOBIN A1C: 4.9 % (ref 4.6–6.5)

## 2014-05-28 LAB — HEPATIC FUNCTION PANEL
ALT: 14 U/L (ref 0–35)
AST: 20 U/L (ref 0–37)
Albumin: 3.7 g/dL (ref 3.5–5.2)
Alkaline Phosphatase: 53 U/L (ref 39–117)
Bilirubin, Direct: 0 mg/dL (ref 0.0–0.3)
Total Bilirubin: 0.5 mg/dL (ref 0.2–1.2)
Total Protein: 6.5 g/dL (ref 6.0–8.3)

## 2014-05-28 LAB — HIGH SENSITIVITY CRP: CRP HIGH SENSITIVITY: 6.17 mg/L — AB (ref 0.000–5.000)

## 2014-05-29 LAB — C3 AND C4
C3 Complement: 113 mg/dL (ref 90–180)
C4 Complement: 25 mg/dL (ref 10–40)

## 2014-06-04 ENCOUNTER — Encounter: Payer: Self-pay | Admitting: Internal Medicine

## 2014-06-04 ENCOUNTER — Ambulatory Visit (INDEPENDENT_AMBULATORY_CARE_PROVIDER_SITE_OTHER): Payer: Medicare Other | Admitting: Internal Medicine

## 2014-06-04 VITALS — BP 120/80 | HR 77 | Temp 98.4°F | Resp 20 | Ht 68.5 in | Wt 206.0 lb

## 2014-06-04 DIAGNOSIS — F411 Generalized anxiety disorder: Secondary | ICD-10-CM

## 2014-06-04 DIAGNOSIS — M3500 Sicca syndrome, unspecified: Secondary | ICD-10-CM

## 2014-06-04 DIAGNOSIS — IMO0001 Reserved for inherently not codable concepts without codable children: Secondary | ICD-10-CM

## 2014-06-04 DIAGNOSIS — G8929 Other chronic pain: Secondary | ICD-10-CM

## 2014-06-04 DIAGNOSIS — E1165 Type 2 diabetes mellitus with hyperglycemia: Secondary | ICD-10-CM

## 2014-06-04 DIAGNOSIS — E039 Hypothyroidism, unspecified: Secondary | ICD-10-CM

## 2014-06-04 DIAGNOSIS — Z23 Encounter for immunization: Secondary | ICD-10-CM

## 2014-06-04 MED ORDER — METFORMIN HCL ER 500 MG PO TB24: 500.0000 mg | ORAL_TABLET | Freq: Two times a day (BID) | ORAL | Status: DC

## 2014-06-04 MED ORDER — TRIAMCINOLONE ACETONIDE 0.1 % EX CREA: 1.0000 "application " | TOPICAL_CREAM | Freq: Two times a day (BID) | CUTANEOUS | Status: DC

## 2014-06-04 NOTE — Progress Notes (Signed)
Pre visit review using our clinic review tool, if applicable. No additional management support is needed unless otherwise documented below in the visit note. 

## 2014-06-04 NOTE — Patient Instructions (Signed)
Limit your sodium (Salt) intake    It is important that you exercise regularly, at least 20 minutes 3 to 4 times per week.  If you develop chest pain or shortness of breath seek  medical attention.  You need to lose weight.  Consider a lower calorie diet and regular exercise.  Return in 4 months for follow-up 

## 2014-06-04 NOTE — Progress Notes (Signed)
Subjective:    [name], female    DOB: 03-22-1966, 48 y.o.   MRN: [id number]  HPI 48 year old patient who is in today for followup.  She has a history of a lupus-like syndrome (negative serology).  She has been seen by ophthalmology recently and given a diagnosis of sicca syndrome.  She is now on prednisone 20 mg daily.  She is asking for a topical steroid for a dermatitis involving her lower extremity. She remains on an immunosuppressant drug therapy.  Recent laboratory profile reviewed.  Hemoglobin A1c remains in a normal range on metformin therapy. She has been seen by neurology recently due to left arm paresthesias and EMG/NCS are scheduled  Past Medical History  Diagnosis Date  . ANXIETY 06/06/2007  . BACK PAIN 02/22/2009  . DEPRESSION 06/06/2007  . FREQUENCY, URINARY 07/07/2007  . GERD 06/06/2007  . HYPOTHYROIDISM 06/06/2007  . INTERSTITIAL CYSTITIS 09/11/2010  . NEPHROLITHIASIS, HX OF 11/05/2010  . OSTEOPENIA 10/14/2009  . OVERACTIVE BLADDER 10/14/2009  . PAIN, CHRONIC NEC 06/06/2007  . POLYARTHRITIS 08/19/2007  . 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 WEIGHT GAIN 02/22/2009  . Diabetes mellitus without complication   . Lupus 2006  . Cervical cancer     History   Social History  . Marital Status: Divorced    Spouse Name: N/A    Number of Children: 1  . Years of Education: N/A   Occupational History  . DISABLED    Social History Main Topics  . Smoking status: Never Smoker   . Smokeless tobacco: Never Used  . Alcohol Use: No  . Drug Use: No  . Sexual Activity: Not on file   Other Topics Concern  . 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   She lives with daughter (33) and mother (19).          Past Surgical History  Procedure Laterality Date  . Appendectomy    . Tonsillectomy    . Wrist reconstruction    . Revision total hip arthroplasty  06-2009  . Breast surgery       Family History  Problem Relation Age of Onset  . Hypertension Mother   . Arthritis Mother   . Asthma Sister   . Asthma Daughter   . Diabetes Maternal Grandmother   . Heart disease Maternal Grandmother   . Colon cancer Maternal Uncle 21  . Pancreatic cancer Father   . Crohn's disease Maternal Aunt   . Diabetes Maternal Aunt     Allergies  Allergen Reactions  . Clarithromycin   . Dhea [Nutritional Supplements]   . Fish Allergy   . Latex   . Penicillins   . Prasterone   . Promethazine Hcl   . Hydromorphone Hcl Rash    "Rash all over"    Current Outpatient Prescriptions on File Prior to Visit  Medication Sig Dispense Refill  . ABILIFY 15 MG tablet Take 15 mg by mouth daily.       Marland Kitchen albuterol (PROVENTIL HFA) 108 (90 BASE) MCG/ACT inhaler Inhale 2 puffs into the lungs every 4 (four) hours as needed for wheezing or shortness of breath.       . Ascorbic Acid (VITAMIN C GUMMIE PO) Take 4 tablets by mouth every morning.      Marland Kitchen aspirin 81 MG tablet Take 81 mg by mouth daily.      Marland Kitchen azaTHIOprine (IMURAN) 50 MG tablet  Take 100 mg by mouth daily.      . Calcium-Phosphorus-Vitamin D (CALCIUM GUMMIES PO) Take 2 tablets by mouth every morning.      . Cholecalciferol (VITAMIN D) 2000 UNITS CAPS Take 2,000 Units by mouth every morning.       . dapsone 25 MG tablet Take 25 mg by mouth every morning.       . diclofenac sodium (VOLTAREN) 1 % GEL Apply 2 g topically 4 (four) times daily.      . DULoxetine (CYMBALTA) 60 MG capsule Take 60 mg by mouth 2 (two) times daily.      . fentaNYL (DURAGESIC - DOSED MCG/HR) 75 MCG/HR Place 75 mcg onto the skin every 3 (three) days.      . folic acid (FOLVITE) 1 MG tablet Take 1 mg by mouth daily.      Marland Kitchen levothyroxine (SYNTHROID, LEVOTHROID) 25 MCG tablet Take 1 tablet (25 mcg total) by mouth daily before breakfast.  90 tablet  1  . LORazepam (ATIVAN) 1 MG tablet Take 1-2 mg by mouth 3 (three) times daily. 1mg  am and afternoon and 2 mg at bedtime        . metaxalone (SKELAXIN) 800 MG tablet Take 800 mg by mouth 3 (three) times daily as needed for pain.      . metFORMIN (GLUCOPHAGE-XR) 500 MG 24 hr tablet Take 500 mg by mouth 2 (two) times daily.      . methotrexate 25 MG/ML injection Inject 25 mg into the skin once a week. On saturdays      . methylphenidate (CONCERTA) 27 MG CR tablet Take 27 mg by mouth every morning.      . Multiple Vitamins-Minerals (HM MULTIVITAMIN ADULT GUMMY PO) Take 2 tablets by mouth daily.      Marland Kitchen oxyCODONE-acetaminophen (PERCOCET) 10-325 MG per tablet Take 1 tablet by mouth every 6 (six) hours as needed for pain.       . pantoprazole (PROTONIX) 40 MG tablet Take one tablet by mouth twice daily  180 tablet  3  . predniSONE (DELTASONE) 10 MG tablet Take 20 mg by mouth daily.       Marland Kitchen topiramate (TOPAMAX) 100 MG tablet Take one tablet by mouth twice daily  180 tablet  1  . valACYclovir (VALTREX) 500 MG tablet Take 500 mg by mouth at bedtime.        No current facility-administered medications on file prior to visit.    BP 120/80  Pulse 77  Temp(Src) 98.4 F (36.9 C) (Oral)  Resp 20  Ht 5' 8.5" (1.74 m)  Wt 206 lb (93.441 kg)  BMI 30.86 kg/m2  SpO2 95%      Review of Systems  Constitutional: Positive for fatigue.  HENT: Negative for congestion, dental problem, hearing loss, rhinorrhea, sinus pressure, sore throat and tinnitus.   Eyes: Negative for pain, discharge and visual disturbance.  Respiratory: Negative for cough and shortness of breath.   Cardiovascular: Negative for chest pain, palpitations and leg swelling.  Gastrointestinal: Negative for nausea, vomiting, abdominal pain, diarrhea, constipation, blood in stool and abdominal distention.  Genitourinary: Negative for dysuria, urgency, frequency, hematuria, flank pain, vaginal bleeding, vaginal discharge, difficulty urinating, vaginal pain and pelvic pain.  Musculoskeletal: Positive for arthralgias, gait problem, myalgias and neck pain. Negative for  joint swelling.  Skin: Positive for rash.  Neurological: Positive for tremors, weakness and numbness. Negative for dizziness, syncope, speech difficulty and headaches.  Hematological: Negative for adenopathy.  Psychiatric/Behavioral: Negative for behavioral problems,  dysphoric mood and agitation. The patient is not nervous/anxious.        Objective:   Physical Exam  Constitutional: She is oriented to person, place, and time. She appears well-developed and well-nourished.  Weight 206 Normal blood pressure Walks with a cane  HENT:  Head: Normocephalic.  Right Ear: External ear normal.  Left Ear: External ear normal.  Mouth/Throat: Oropharynx is clear and moist.  Eyes: Conjunctivae and EOM are normal. Pupils are equal, round, and reactive to light.  Neck: Normal range of motion. Neck supple. No thyromegaly present.  Cardiovascular: Normal rate, regular rhythm, normal heart sounds and intact distal pulses.   Pulmonary/Chest: Effort normal and breath sounds normal.  Abdominal: Soft. Bowel sounds are normal. She exhibits no mass. There is no tenderness.  Musculoskeletal: Normal range of motion.  Lymphadenopathy:    She has no cervical adenopathy.  Neurological: She is alert and oriented to person, place, and time.  Skin: Skin is warm and dry. No rash noted.  Hyperpigmentation of the lower extremities  Psychiatric: She has a normal mood and affect. Her behavior is normal.          Assessment & Plan:   Lupus-like syndrome.  We'll update immunizations, and give Prevnar 13 Diabetes.  Continue metformin therapy.  Weight loss encouraged Chronic pain syndrome Sicca syndrome.  Followup ophthalmology

## 2014-06-15 ENCOUNTER — Ambulatory Visit (INDEPENDENT_AMBULATORY_CARE_PROVIDER_SITE_OTHER): Payer: Medicare Other | Admitting: Neurology

## 2014-06-15 DIAGNOSIS — G959 Disease of spinal cord, unspecified: Secondary | ICD-10-CM

## 2014-06-15 DIAGNOSIS — R209 Unspecified disturbances of skin sensation: Secondary | ICD-10-CM

## 2014-06-15 NOTE — Procedures (Signed)
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  NCV & EMG Findings: Extensive electrodiagnostic testing of the left upper and lower extremities reveals a following:  1. The left ulnar sensory nerve showed prolonged distal peak latency (3.6 ms). Median, radial, and palmar studies are within normal limits.  2. Evaluation of the left ulnar motor nerve showed decreased conduction velocity (A Elbow-B Elbow, 38 m/s).  Median motor response is within normal limits. 3. The sural and superficial peroneal sensory responses are within normal limits. 4. The tibial and peroneal motor responses are within normal limits. 5. In the arms, chronic motor axon loss changes are seen affecting the flexor digitorum profundus 4&5 muscle without accompanied active innervation.   6. In the leg, chronic motor axonal loss changes are seen affecting the L5 myotome on the left. Similar changes were not present in the right lower extremity.   Impression: 1. Left ulnar neuropathy with slowing across the elbow, demyelinating and axon loss in type. 2. Left chronic L5 radiculopathy, mild in degree electrically. 3. There is no evidence of a generalized sensorimotor polyneuropathy or a cervical radiculopathy affecting the left side.  Previous electrodiagnostic studies are not available to compare.    ___________________________ Narda Amber, DO    Nerve Conduction Studies Anti Sensory Summary Table   Site NR Peak (ms) Norm Peak (ms) P-T Amp (V) Norm P-T Amp  Left Median Anti  Sensory (2nd Digit)  Site 3    3.3  23.9   Left Radial Anti Sensory (Base 1st Digit)  Wrist    2.3 <2.7 29.6 >18  Left Sup Peroneal Anti Sensory (Ant Lat Mall)  12 cm    2.6 <4.5 8.5 >5  Left Sural Anti Sensory (Lat Mall)  Calf    4.5 <4.5 5.7 >5  Left Ulnar Anti Sensory (5th Digit)  Wrist    3.6 <3.1 18.0 >12   Motor Summary Table   Site NR Onset (ms) Norm Onset (ms) O-P Amp (mV) Norm O-P Amp Site1 Site2 Delta-0 (ms) Dist (cm) Vel (m/s) Norm Vel (m/s)  Left Median Motor (Abd Poll Brev)  Wrist    3.0 <3.9 8.6 >6 Elbow Wrist 5.4 31.0 57 >50  Elbow    8.4  8.6         Left Peroneal Motor (Ext Dig Brev)  Ankle    3.3 <5.5 4.7 >3 B Fib Ankle 9.2 39.0 42 >40  B Fib    12.5  4.3  Poplt B Fib 2.0 10.0 50 >40  Poplt    14.5  4.2         Left Peroneal TA Motor (Tib Ant)  Fib Head    2.5 <4.0 5.4 >4 Poplit Fib Head 1.6 10.0 63 >40  Poplit    4.1  5.4         Left Tibial Motor (Abd Hall Brev)  Ankle    3.4 <6.0 8.0 >8 Knee Ankle 11.3 45.0 40 >40  Knee    14.7  5.1         Left Ulnar Motor (Abd Dig Minimi)  Wrist    2.0 <3.1 8.1 >7 B Elbow Wrist 4.6 25.0 54 >50  B Elbow    6.6  7.4  A Elbow B Elbow 2.6 10.0 38 >50  A Elbow    9.2  7.3          Comparison Summary Table   Site NR Peak (ms) Norm Peak (ms) P-T Amp (V) Site1 Site2 Delta-P (ms) Norm Delta (ms)  Left Median/Ulnar Palm Comparison (Wrist - 8cm)  Median Palm    1.8 <2.2 39.9 Median Palm Ulnar Palm 0.0   Ulnar Palm    1.8 <2.2 21.2       F Wave Studies   NR F-Lat (ms) Lat Norm (ms) L-R F-Lat (ms)  Left Ulnar (Mrkrs) (Abd Dig Min)     32.53 <33    EMG   Side Muscle Ins Act Fibs Psw Fasc Number Recrt Dur Dur. Amp Amp. Poly Poly. Comment  Left 1stDorInt Nml Nml Nml Nml 1- Mod-V Nml Nml Nml Nml Nml Nml N/A  Left ABD Dig Min Nml Nml Nml Nml 1- Mod-V Nml Nml Nml Nml Nml Nml N/A  Left Ext Indicis Nml Nml Nml Nml Nml Nml Nml Nml Nml Nml Nml Nml N/A  Left PronatorTeres Nml Nml Nml Nml Nml Nml Nml Nml Nml Nml Nml Nml N/A  Left  FlexDigProf 4,5 Nml Nml Nml Nml 1- Mod-R Some 1+ Some 1+ Nml Nml N/A  Left Triceps Nml Nml Nml Nml Nml Nml Nml Nml Nml Nml Nml Nml N/A  Left Deltoid Nml Nml Nml Nml Nml Nml Nml Nml Nml Nml Nml Nml N/A  Left AntTibialis Nml Nml Nml Nml 1- Mod-V Few 1+ Nml Nml Few 1+ N/A  Left Gastroc Nml Nml Nml Nml Nml Nml Nml Nml Nml Nml Nml Nml N/A  Left Flex Dig Long Nml Nml Nml Nml 1- Mod-R Few 1+ Nml Nml Nml Nml N/A  Left GluteusMed Nml Nml Nml Nml 1- Mod-V Few 1+ Few 1+ Nml Nml N/A  Left RectFemoris Nml Nml Nml Nml Nml Nml Nml Nml Nml Nml Nml Nml N/A  Right AntTibialis Nml Nml Nml Nml Nml Nml Nml Nml Nml Nml Nml Nml N/A  Right Flex Dig Long Nml Nml Nml Nml Nml Nml Nml Nml Nml Nml Nml Nml N/A  Right Gastroc Nml Nml Nml Nml Nml Nml Nml Nml Nml Nml Nml Nml N/A      Waveforms:

## 2014-06-19 ENCOUNTER — Ambulatory Visit (INDEPENDENT_AMBULATORY_CARE_PROVIDER_SITE_OTHER): Payer: Medicare Other | Admitting: Neurology

## 2014-06-19 ENCOUNTER — Encounter: Payer: Self-pay | Admitting: Neurology

## 2014-06-19 VITALS — BP 110/70 | HR 95 | Ht 68.5 in | Wt 202.1 lb

## 2014-06-19 DIAGNOSIS — G959 Disease of spinal cord, unspecified: Secondary | ICD-10-CM

## 2014-06-19 DIAGNOSIS — R269 Unspecified abnormalities of gait and mobility: Secondary | ICD-10-CM

## 2014-06-19 DIAGNOSIS — G5622 Lesion of ulnar nerve, left upper limb: Secondary | ICD-10-CM

## 2014-06-19 DIAGNOSIS — G5620 Lesion of ulnar nerve, unspecified upper limb: Secondary | ICD-10-CM

## 2014-06-19 NOTE — Patient Instructions (Signed)
1.  MRI brain wwo contrast 2.  MRI thoracic spine wwo contrast 3.  We will contact you with information regarding nerve ultrasound 4.  Return to clinic in 83-months

## 2014-06-19 NOTE — Progress Notes (Signed)
Follow-up Visit   Date: 06/19/2014    [name] [name] MRN: [id number] DOB: [birth year]   Interim History: [name] [name] is a 48 y.o. 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   History of present illness: Starting in March 2015, she developed intermittent bilateral feet numbness worse at night and would wake her from sleeping. Over a month, symptoms migrated to her left shoulder and hand. She saw orthopaedic surgery, Dr. Gerrit Heck, who recommended using the wrist splint. She also saw her rheumatologist, Dr. Johnna Acosta in [place], [place] and was given steroid injection which improved numbness of her feet and shoulder, but hand paresthesias remained unchanged. He also increased her prednisone to 20mg  and recently tapered it to 10mg  (04/20/2014) without worsening of symptoms. She has been wearing a wrist splint for the past month, which has helped her hand swelling. Hand numbness is constant and involves her first three fingers. It is worse with finger manipulations and not improved by anything.   She denies any neck or back pain. She has chronic polyarthralgias from lupus and is taking percocet and fentanyl 61mcg patch. No weakness of the hands or feet. She has a long history of bilateral hand tremors which has become worse.   She reports having history of left optic neuritis (2006) treated with IV steroids. MRI brain and several lumbar punctures was negative for demyelinating changes. In 2007, she had mononeuritis multiplex with residual left lateral leg sensory loss and gait abnormalities. EMG was done at [facility] in [place], [place] (results not  available). She has been ambulating with a cane since 2006.  UPDATE 06/19/2014: She is here to discuss results of EMG which showed left ulnar neuropathy at the elbow.  She denies resting her elbow on surfaces or having habit over flexing her elbows often.  She also report being diagnosed with sicca syndrome by opthalmologist.  Her rheumatologist also increased her prednisone 20mg  daily for flare up of lupus.     Medications:  Current Outpatient Prescriptions on File Prior to Visit  Medication Sig Dispense Refill  . ABILIFY 15 MG tablet Take 15 mg by mouth daily.       Marland Kitchen albuterol (PROVENTIL HFA) 108 (90 BASE) MCG/ACT inhaler Inhale 2 puffs into the lungs every 4 (four) hours as needed for wheezing or shortness of breath.       . Ascorbic Acid (VITAMIN C GUMMIE PO) Take 4 tablets by mouth every morning.      Marland Kitchen aspirin 81 MG tablet Take 81 mg by mouth daily.      Marland Kitchen azaTHIOprine (IMURAN) 50 MG tablet Take 100 mg by mouth daily.      . Calcium-Phosphorus-Vitamin D (CALCIUM GUMMIES PO) Take 2 tablets by mouth every morning.      . Cholecalciferol (VITAMIN D) 2000 UNITS CAPS Take 2,000 Units by mouth every morning.       . dapsone 25 MG tablet Take 25 mg by mouth every morning.       . diclofenac sodium (VOLTAREN) 1 % GEL Apply 2 g topically 4 (four) times daily.      . DULoxetine (CYMBALTA) 60 MG capsule Take 60 mg by mouth 2 (two) times daily.      . fentaNYL (DURAGESIC - DOSED MCG/HR) 75  MCG/HR Place 75 mcg onto the skin every 3 (three) days.      . folic acid (FOLVITE) 1 MG tablet Take 1 mg by mouth daily.      Marland Kitchen levothyroxine (SYNTHROID, LEVOTHROID) 25 MCG tablet Take 1 tablet (25 mcg total) by mouth daily before breakfast.  90 tablet  1  . LORazepam (ATIVAN) 1 MG tablet Take 1-2 mg by mouth 3 (three) times daily. 1mg  am and afternoon and 2 mg at bedtime      . metaxalone (SKELAXIN) 800 MG tablet Take 800 mg by mouth 3 (three) times daily as needed for pain.      . metFORMIN (GLUCOPHAGE-XR) 500  MG 24 hr tablet Take 1 tablet (500 mg total) by mouth 2 (two) times daily.  180 tablet  3  . methotrexate 25 MG/ML injection Inject 25 mg into the skin once a week. On saturdays      . methylphenidate (CONCERTA) 27 MG CR tablet Take 27 mg by mouth every morning.      . Multiple Vitamins-Minerals (HM MULTIVITAMIN ADULT GUMMY PO) Take 2 tablets by mouth daily.      Marland Kitchen oxyCODONE-acetaminophen (PERCOCET) 10-325 MG per tablet Take 1 tablet by mouth every 6 (six) hours as needed for pain.       . pantoprazole (PROTONIX) 40 MG tablet Take one tablet by mouth twice daily  180 tablet  3  . predniSONE (DELTASONE) 10 MG tablet Take 20 mg by mouth daily.       Marland Kitchen topiramate (TOPAMAX) 100 MG tablet Take one tablet by mouth twice daily  180 tablet  1  . triamcinolone cream (KENALOG) 0.1 % Apply 1 application topically 2 (two) times daily.  85.2 g  1  . valACYclovir (VALTREX) 500 MG tablet Take 500 mg by mouth at bedtime.        No current facility-administered medications on file prior to visit.    Allergies:  Allergies  Allergen Reactions  . Clarithromycin   . Compazine [Prochlorperazine Edisylate]   . Dhea [Nutritional Supplements]   . Fish Allergy   . Latex   . Penicillins   . Prasterone   . Promethazine Hcl   . Hydromorphone Hcl Rash and Hives    "Rash all over"     Review of Systems:  CONSTITUTIONAL: No fevers, chills, night sweats, or weight loss.   EYES: + visual changes or eye pain ENT: No hearing changes.  No history of nose bleeds.   RESPIRATORY: No cough, wheezing and shortness of breath.   CARDIOVASCULAR: Negative for chest pain, and palpitations.   GI: Negative for abdominal discomfort, blood in stools or black stools.  No recent change in bowel habits.   GU:  No history of incontinence.   MUSCLOSKELETAL: + history of joint pain or swelling.  + myalgias.   SKIN: Negative for lesions, rash, and itching.   ENDOCRINE: Negative for cold or heat intolerance, polydipsia or goiter.     PSYCH:  + depression or anxiety symptoms.   NEURO: As Above.   Vital Signs:  BP 110/70  Pulse 95  Ht 5' 8.5" (1.74 m)  Wt 202 lb 1 oz (91.655 kg)  BMI 30.27 kg/m2  SpO2 92%  Neurological Exam: MENTAL STATUS including orientation to time, place, person, recent and remote memory, attention span and concentration, language, and fund of knowledge is normal.  Speech is not dysarthric.  CRANIAL NERVES:  Pupils equal round and reactive to light.  Normal conjugate, extra-ocular eye movements  in all directions of gaze.  No ptosis.   Face is symmetric. Palate elevates symmetrically.  Tongue is midline.  MOTOR:  Motor strength is 5/5 in all extremities.  Bilateral lower extremity with increased tone (0+).  Bilateral hand and feet (R >L) postural tremor. No pronator drift.     MSRs:  Right      Left  brachioradialis  3+   brachioradialis  tr   biceps  3+   biceps  2+   triceps  3+   triceps  2+   patellar  3+   patellar  3+   ankle jerk  2+   ankle jerk  2+   Hoffman  no   Hoffman  no   plantar response  down   plantar response  down    COORDINATION/GAIT: Gait is wide-based, antalgic, and appears slightly spastic.  Data: EMG left upper and lower extremity 06/15/2014: 1. Left ulnar neuropathy with slowing across the elbow, demyelinating and axon loss in type. 2. Left chronic L5 radiculopathy, mild in degree electrically. 3. There is no evidence of a generalized sensorimotor polyneuropathy or a cervical radiculopathy affecting the left side. Previous electrodiagnostic studies are not available to compare.  Marland Kitchen  MRI cervical spine with and without contrast 05/04/2014: Minimal disc bulge is C5-C6 and C6-C7 some bulking ligamentum flavum labrum. This may be exacerbated by extension of the head in the head holder. No significant cord compression. Neural foramen are patent throughout.    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 She underwent NCS/EMG of the left arm and leg which showed left ulnar neuropathy at the elbow and mild L5 radiculopathy.  There is no evidence of neuropathy involving the legs, specifically her superficial peroneal response is normal (previously absent when diagnosed with mononeuritis multiplex).  She has history of several neurological conditions due to lupus including left optic neuritis and mononeuritis multiplex (subjective sensory loss over left superficial peroneal nerve distribution). Her exam is also notable for brisk reflexes throughout (R >L), except trace at left C6 and slightly increased tone in the legs with spastic gait pattern. MRI cervical spine showed minimal disc bulge at C5-6 and C6-7 without cord abnormalities.  To further investigate myelopathic findings, will order imaging of MRI brain and thoracic wwo contrast.  Individuals with lupus can develop myelopathy and neuromyelitis optica, so this needs to be considered going forward.   Regarding left ulnar neuropathy, she denies have provoking actions such as repetitive trauma or hyperlfexion of the elbow, so will order nerve ultrasound at [facility] to look for any structural pathology of the nerve.  The duration of this appointment visit was 25 minutes of face-to-face time with the patient.  Greater than 50% of this time was spent in counseling, explanation of diagnosis, planning of further management, and coordination of care.   Thank you for allowing me to participate in patient's care.  If I can answer any additional questions, I would be pleased to do so.    Sincerely,    Brytney Somes K. Posey Pronto, DO

## 2014-06-20 ENCOUNTER — Other Ambulatory Visit: Payer: Self-pay | Admitting: *Deleted

## 2014-06-25 ENCOUNTER — Telehealth: Payer: Self-pay | Admitting: *Deleted

## 2014-06-25 ENCOUNTER — Other Ambulatory Visit: Payer: Self-pay | Admitting: *Deleted

## 2014-06-25 DIAGNOSIS — G5622 Lesion of ulnar nerve, left upper limb: Secondary | ICD-10-CM

## 2014-06-25 DIAGNOSIS — G589 Mononeuropathy, unspecified: Secondary | ICD-10-CM

## 2014-06-25 NOTE — Telephone Encounter (Signed)
Order and notes faxed to [facility]. [phone].  Ulnar nerve ultrasound.

## 2014-06-26 ENCOUNTER — Other Ambulatory Visit: Payer: Medicare Other

## 2014-07-03 ENCOUNTER — Emergency Department: Payer: Self-pay | Admitting: Emergency Medicine

## 2014-07-03 ENCOUNTER — Telehealth: Payer: Self-pay | Admitting: Internal Medicine

## 2014-07-03 LAB — COMPREHENSIVE METABOLIC PANEL WITH GFR
Albumin: 3.2 g/dL — ABNORMAL LOW
Alkaline Phosphatase: 59 U/L
Anion Gap: 0 — ABNORMAL LOW
BUN: 19 mg/dL — ABNORMAL HIGH
Bilirubin,Total: 0.4 mg/dL
Calcium, Total: 8.3 mg/dL — ABNORMAL LOW
Chloride: 116 mmol/L — ABNORMAL HIGH
Co2: 23 mmol/L
Creatinine: 0.94 mg/dL
EGFR (African American): >60
EGFR (Non-African Amer.): >60
Glucose: 92 mg/dL
Osmolality: 279
Potassium: 3.5 mmol/L
SGOT(AST): 20 U/L
SGPT (ALT): 21 U/L
Sodium: 139 mmol/L
Total Protein: 6.4 g/dL

## 2014-07-03 LAB — CBC
HCT: 39.2 % (ref 35.0–47.0)
HGB: 12.4 g/dL (ref 12.0–16.0)
MCH: 31.2 pg (ref 26.0–34.0)
MCHC: 31.6 g/dL — ABNORMAL LOW (ref 32.0–36.0)
MCV: 99 fL (ref 80–100)
PLATELETS: 166 10*3/uL (ref 150–440)
RBC: 3.97 10*6/uL (ref 3.80–5.20)
RDW: 15.3 % — ABNORMAL HIGH (ref 11.5–14.5)
WBC: 9.8 10*3/uL (ref 3.6–11.0)

## 2014-07-03 LAB — SEDIMENTATION RATE: Erythrocyte Sed Rate: 3 mm/h

## 2014-07-03 NOTE — Telephone Encounter (Signed)
Okay to use same day or put 2 appt together.

## 2014-07-03 NOTE — Telephone Encounter (Signed)
Pt went to ed last nite for lupas flair up. Advised pt to fu in 5 days. No 30 min, is it ok to use SD or put 2 appt together, or see someone else? pls advise!!

## 2014-07-04 NOTE — Telephone Encounter (Signed)
Put 2 together and scheduled pt!

## 2014-07-05 ENCOUNTER — Ambulatory Visit
Admission: RE | Admit: 2014-07-05 | Discharge: 2014-07-05 | Disposition: A | Discharge status: Home / Self Care | Payer: Medicare Other | Source: Ambulatory Visit | Attending: Neurology | Admitting: Neurology

## 2014-07-05 DIAGNOSIS — R269 Unspecified abnormalities of gait and mobility: Secondary | ICD-10-CM

## 2014-07-05 DIAGNOSIS — G959 Disease of spinal cord, unspecified: Secondary | ICD-10-CM

## 2014-07-05 DIAGNOSIS — G5622 Lesion of ulnar nerve, left upper limb: Secondary | ICD-10-CM

## 2014-07-05 MED ORDER — GADOBENATE DIMEGLUMINE 529 MG/ML IV SOLN
19.0000 mL | Freq: Once | INTRAVENOUS | Status: AC | PRN
  Administered 2014-07-05: 19 mL via INTRAVENOUS

## 2014-07-09 ENCOUNTER — Encounter: Payer: Self-pay | Admitting: Internal Medicine

## 2014-07-09 ENCOUNTER — Ambulatory Visit (INDEPENDENT_AMBULATORY_CARE_PROVIDER_SITE_OTHER): Payer: Medicare Other | Admitting: Internal Medicine

## 2014-07-09 VITALS — BP 130/80 | HR 84 | Temp 98.0°F | Resp 20 | Ht 68.5 in | Wt 200.0 lb

## 2014-07-09 DIAGNOSIS — IMO0001 Reserved for inherently not codable concepts without codable children: Secondary | ICD-10-CM

## 2014-07-09 DIAGNOSIS — M130 Polyarthritis, unspecified: Secondary | ICD-10-CM

## 2014-07-09 DIAGNOSIS — G5622 Lesion of ulnar nerve, left upper limb: Secondary | ICD-10-CM

## 2014-07-09 DIAGNOSIS — G5620 Lesion of ulnar nerve, unspecified upper limb: Secondary | ICD-10-CM

## 2014-07-09 DIAGNOSIS — G8929 Other chronic pain: Secondary | ICD-10-CM

## 2014-07-09 DIAGNOSIS — E1165 Type 2 diabetes mellitus with hyperglycemia: Principal | ICD-10-CM

## 2014-07-09 MED ORDER — TRAMADOL HCL 50 MG PO TABS: 50.0000 mg | ORAL_TABLET | Freq: Three times a day (TID) | ORAL | Status: DC | PRN

## 2014-07-09 NOTE — Patient Instructions (Addendum)
Rheumatology and neurology followup as discussed   Please check your hemoglobin A1c every 3 months

## 2014-07-09 NOTE — Progress Notes (Signed)
Subjective:    [name], female    DOB: 03/20/1966, 48 y.o.   MRN: [id number]  HPI  48 year old patient who is seen today in followup.  She was seen in the ER one week ago at [facility] and treated for several hours for an acute pain crisis.  She continues to have the left arm pain.  She has been seen by neurology for a left ulnar nerve entrapment at the level of the elbow.  She has also been seen by orthopedic surgery.  3 days ago, she had a ulnar nerve.  Ultrasound performed at [facility]. Today she seems to be back to baseline.  She has a history of diabetes, controlled with metformin therapy.  She has hypothyroidism and a history of anxiety, depression  Past Medical History  Diagnosis Date  . ANXIETY 06/06/2007  . BACK PAIN 02/22/2009  . DEPRESSION 06/06/2007  . FREQUENCY, URINARY 07/07/2007  . GERD 06/06/2007  . HYPOTHYROIDISM 06/06/2007  . INTERSTITIAL CYSTITIS 09/11/2010  . NEPHROLITHIASIS, HX OF 11/05/2010  . OSTEOPENIA 10/14/2009  . OVERACTIVE BLADDER 10/14/2009  . PAIN, CHRONIC NEC 06/06/2007  . POLYARTHRITIS 08/19/2007  . 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 WEIGHT GAIN 02/22/2009  . Diabetes mellitus without complication   . Lupus 2006  . Cervical cancer     History   Social History  . Marital Status: Single    Spouse Name: N/A    Number of Children: 1  . Years of Education: N/A   Occupational History  . DISABLED    Social History Main Topics  . Smoking status: Never Smoker   . Smokeless tobacco: Never Used  . Alcohol Use: No  . Drug Use: No  . Sexual Activity: Not on file   Other Topics Concern  . 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   She lives with daughter (77) and mother (38).          Past Surgical History  Procedure Laterality Date  . Appendectomy    . Tonsillectomy    . Wrist reconstruction    . Revision total hip arthroplasty   06-2009  . Breast surgery      Family History  Problem Relation Age of Onset  . Hypertension Mother   . Arthritis Mother   . Asthma Sister   . Asthma Daughter   . Diabetes Maternal Grandmother   . Heart disease Maternal Grandmother   . Colon cancer Maternal Uncle 16  . Pancreatic cancer Father   . Crohn's disease Maternal Aunt   . Diabetes Maternal Aunt     Allergies  Allergen Reactions  . Clarithromycin   . Compazine [Prochlorperazine Edisylate]   . Dhea [Nutritional Supplements]   . Fish Allergy   . Latex   . Penicillins   . Prasterone   . Promethazine Hcl   . Hydromorphone Hcl Rash and Hives    "Rash all over"    Current Outpatient Prescriptions on File Prior to Visit  Medication Sig Dispense Refill  . ABILIFY 15 MG tablet Take 15 mg by mouth daily.       Marland Kitchen albuterol (PROVENTIL HFA) 108 (90 BASE) MCG/ACT inhaler Inhale 2 puffs into the lungs every 4 (four) hours as needed for wheezing or shortness of breath.       . Ascorbic Acid (VITAMIN C GUMMIE PO) Take 4 tablets by mouth every morning.      Marland Kitchen  aspirin 81 MG tablet Take 81 mg by mouth daily.      Marland Kitchen azaTHIOprine (IMURAN) 50 MG tablet Take 100 mg by mouth daily.      . Calcium-Phosphorus-Vitamin D (CALCIUM GUMMIES PO) Take 2 tablets by mouth every morning.      . Cholecalciferol (VITAMIN D) 2000 UNITS CAPS Take 2,000 Units by mouth every morning.       . dapsone 25 MG tablet Take 25 mg by mouth every morning.       . diclofenac sodium (VOLTAREN) 1 % GEL Apply 2 g topically 4 (four) times daily.      . DULoxetine (CYMBALTA) 60 MG capsule Take 60 mg by mouth 2 (two) times daily.      . fentaNYL (DURAGESIC - DOSED MCG/HR) 75 MCG/HR Place 75 mcg onto the skin every 3 (three) days.      . folic acid (FOLVITE) 1 MG tablet Take 1 mg by mouth daily.      Marland Kitchen levothyroxine (SYNTHROID, LEVOTHROID) 25 MCG tablet Take 1 tablet (25 mcg total) by mouth daily before breakfast.  90 tablet  1  . LORazepam (ATIVAN) 1 MG tablet Take 1-2  mg by mouth 3 (three) times daily. 1mg  am and afternoon and 2 mg at bedtime      . metaxalone (SKELAXIN) 800 MG tablet Take 800 mg by mouth 3 (three) times daily as needed for pain.      . metFORMIN (GLUCOPHAGE-XR) 500 MG 24 hr tablet Take 1 tablet (500 mg total) by mouth 2 (two) times daily.  180 tablet  3  . methotrexate 25 MG/ML injection Inject 25 mg into the skin once a week. On saturdays      . methylphenidate (CONCERTA) 27 MG CR tablet Take 27 mg by mouth every morning.      . Multiple Vitamins-Minerals (HM MULTIVITAMIN ADULT GUMMY PO) Take 2 tablets by mouth daily.      Marland Kitchen oxyCODONE-acetaminophen (PERCOCET) 10-325 MG per tablet Take 1 tablet by mouth every 6 (six) hours as needed for pain.       . pantoprazole (PROTONIX) 40 MG tablet Take one tablet by mouth twice daily  180 tablet  3  . predniSONE (DELTASONE) 10 MG tablet Take 20 mg by mouth daily.       . Pyridoxal-5 Phosphate POWD Take by mouth.      . topiramate (TOPAMAX) 100 MG tablet Take one tablet by mouth twice daily  180 tablet  1  . triamcinolone cream (KENALOG) 0.1 % Apply 1 application topically 2 (two) times daily.  85.2 g  1  . valACYclovir (VALTREX) 500 MG tablet Take 500 mg by mouth at bedtime.        No current facility-administered medications on file prior to visit.    BP 130/80  Pulse 84  Temp(Src) 98 F (36.7 C) (Oral)  Resp 20  Ht 5' 8.5" (1.74 m)  Wt 200 lb (90.719 kg)  BMI 29.96 kg/m2  SpO2 97%  LMP 07/02/2014     Review of Systems  Constitutional: Positive for fatigue.  HENT: Negative for congestion, dental problem, hearing loss, rhinorrhea, sinus pressure, sore throat and tinnitus.   Eyes: Negative for pain, discharge and visual disturbance.  Respiratory: Negative for cough and shortness of breath.   Cardiovascular: Negative for chest pain, palpitations and leg swelling.  Gastrointestinal: Negative for nausea, vomiting, abdominal pain, diarrhea, constipation, blood in stool and abdominal  distention.  Genitourinary: Negative for dysuria, urgency, frequency, hematuria, flank pain,  vaginal bleeding, vaginal discharge, difficulty urinating, vaginal pain and pelvic pain.  Musculoskeletal: Positive for arthralgias, gait problem and neck stiffness. Negative for joint swelling.  Skin: Negative for rash.  Neurological: Positive for weakness. Negative for dizziness, syncope, speech difficulty, numbness and headaches.  Hematological: Negative for adenopathy.  Psychiatric/Behavioral: Negative for behavioral problems, dysphoric mood and agitation. The patient is not nervous/anxious.        Objective:   Physical Exam  Constitutional: She is oriented to person, place, and time. She appears well-developed and well-nourished.  Walks with a cane Blood pressure 130/80  HENT:  Head: Normocephalic.  Right Ear: External ear normal.  Left Ear: External ear normal.  Mouth/Throat: Oropharynx is clear and moist.  Eyes: Conjunctivae and EOM are normal. Pupils are equal, round, and reactive to light.  Neck: Normal range of motion. Neck supple. No thyromegaly present.  Cardiovascular: Normal rate, regular rhythm, normal heart sounds and intact distal pulses.   Pulmonary/Chest: Effort normal and breath sounds normal.  Abdominal: Soft. Bowel sounds are normal. She exhibits no mass. There is no tenderness.  Musculoskeletal: Normal range of motion.  Lymphadenopathy:    She has no cervical adenopathy.  Neurological: She is alert and oriented to person, place, and time.  Skin: Skin is warm and dry. No rash noted.  Psychiatric: She has a normal mood and affect. Her behavior is normal.          Assessment & Plan:   Diabetes mellitus.  Well controlled on metformin therapy.   Chronic pain syndrome  Left ulnar neuropathy.  Will review results of ulnar nerve.  Ultrasound performed at [facility] when available.  Followup neurology and orthopedics  Hypothyroidism

## 2014-07-09 NOTE — Progress Notes (Signed)
Pre visit review using our clinic review tool, if applicable. No additional management support is needed unless otherwise documented below in the visit note. 

## 2014-07-10 ENCOUNTER — Telehealth: Payer: Self-pay | Admitting: Neurology

## 2014-07-10 NOTE — Telephone Encounter (Signed)
Please contact [facility] clinic for results on patients nerve [place].  I rec'd a fax that it was completed, but cannot access her in [place].  Thanks.  Paislea Hatton K. Posey Pronto, DO

## 2014-07-11 ENCOUNTER — Telehealth: Payer: Self-pay | Admitting: Neurology

## 2014-07-11 NOTE — Telephone Encounter (Signed)
Pt called wanting to speak to a nurse. She states she is in so much pain. C/B  [phone]

## 2014-07-11 NOTE — Telephone Encounter (Signed)
Done

## 2014-07-11 NOTE — Telephone Encounter (Signed)
Patient is having terrible pain in left arm and fingers.  She called ortho and he said that he can not do anything for her.  She is taking her percocet and has fentanyl patch on.  She is wanting to know if there is any other medicine for nerve pain. (not a narcotic).  I told her you were out of the office this afternoon and I would call her back in the morning when I have an answer for her.  [phone]

## 2014-07-12 ENCOUNTER — Other Ambulatory Visit: Payer: Self-pay | Admitting: *Deleted

## 2014-07-12 MED ORDER — GABAPENTIN 300 MG PO CAPS: 300.0000 mg | ORAL_CAPSULE | Freq: Every day | ORAL | Status: DC

## 2014-07-12 NOTE — Telephone Encounter (Signed)
Please let patient know we can try neurontin.  If agreeable, please send rx for neurontin 300mg  qhs x5 day, then 300mg  twice daily x 5 days, then continue 300mg  TID, #90, 3 refills.   Common effects include increased sleepiness and lightheadedness.  Raychelle Hudman K. Posey Pronto, DO

## 2014-07-12 NOTE — Telephone Encounter (Signed)
Nerve ultrasound soles from [facility] the date and 07/06/2014:  Ultrasound graphic findings could be indicative of the left ulnar neuropathy at the elbow.  There is an enlarged hypoechoic ulnar nerve at the elbow with no subluxation.  Called and informed patient of results.  She has started taking Neurontin and we will continue to follow her clinically.  Christon Gallaway K. Posey Pronto, DO

## 2014-07-12 NOTE — Telephone Encounter (Signed)
Patient agreed to try neurontin.  Rx sent to pharmacy.

## 2014-07-18 ENCOUNTER — Telehealth: Payer: Self-pay | Admitting: *Deleted

## 2014-07-18 ENCOUNTER — Other Ambulatory Visit: Payer: Self-pay | Admitting: *Deleted

## 2014-07-18 ENCOUNTER — Encounter: Payer: Self-pay | Admitting: *Deleted

## 2014-07-18 MED ORDER — GABAPENTIN 100 MG PO CAPS: 100.0000 mg | ORAL_CAPSULE | Freq: Every day | ORAL | Status: DC

## 2014-07-18 NOTE — Telephone Encounter (Signed)
Patient having side effects from her gabapentin  Call back [phone] until 1pm [phone] after

## 2014-07-18 NOTE — Telephone Encounter (Signed)
On 300 mg and is sleeping about 16 hours a day.  This medication was called in on the 13th.  Please advise.  Thanks.

## 2014-07-18 NOTE — Telephone Encounter (Signed)
Please let her know that we can try lower dose of neurontin 100mg  at bedtime or an alternative medication such as nortriptyline 10mg  at bedtime.  She will need a new Rx for 30-days, 3 refills - she should stop neurontin 300mg .  Donika K. Posey Pronto, DO

## 2014-07-18 NOTE — Telephone Encounter (Signed)
Patient wants to try the 100 mg qhs.  Rx sent in.

## 2014-07-20 ENCOUNTER — Ambulatory Visit (INDEPENDENT_AMBULATORY_CARE_PROVIDER_SITE_OTHER): Payer: Medicare Other | Admitting: Internal Medicine

## 2014-07-20 ENCOUNTER — Encounter: Payer: Self-pay | Admitting: Internal Medicine

## 2014-07-20 VITALS — BP 102/74 | Temp 98.1°F | Wt 200.0 lb

## 2014-07-20 DIAGNOSIS — IMO0001 Reserved for inherently not codable concepts without codable children: Secondary | ICD-10-CM

## 2014-07-20 DIAGNOSIS — N3010 Interstitial cystitis (chronic) without hematuria: Secondary | ICD-10-CM

## 2014-07-20 DIAGNOSIS — R300 Dysuria: Secondary | ICD-10-CM

## 2014-07-20 DIAGNOSIS — G8929 Other chronic pain: Secondary | ICD-10-CM

## 2014-07-20 DIAGNOSIS — E1165 Type 2 diabetes mellitus with hyperglycemia: Secondary | ICD-10-CM

## 2014-07-20 LAB — POCT URINALYSIS DIPSTICK
GLUCOSE UA: NEGATIVE
Leukocytes, UA: NEGATIVE
Nitrite, UA: NEGATIVE
RBC UA: NEGATIVE
Spec Grav, UA: 1.02
Urobilinogen, UA: 0.2
pH, UA: 6.5

## 2014-07-20 NOTE — Progress Notes (Signed)
Subjective:    [name], female    DOB: 07/24/1966, 48 y.o.   MRN: [id number]  HPI 48 year old patient who has multiple medical problems including a history of OA B. as well as interstitial cystitis.  She presents with a three-day history of some intermittent left flank pain, dysuria, urgency.  She also describes some occasional nausea and fever.  She describes some mild suprapubic discomfort.  There's been some mild diarrhea.  She was concerned about a urinary tract infection. Urinalysis performed today and was normal.  Afebrile.  Past Medical History  Diagnosis Date  . ANXIETY 06/06/2007  . BACK PAIN 02/22/2009  . DEPRESSION 06/06/2007  . FREQUENCY, URINARY 07/07/2007  . GERD 06/06/2007  . HYPOTHYROIDISM 06/06/2007  . INTERSTITIAL CYSTITIS 09/11/2010  . NEPHROLITHIASIS, HX OF 11/05/2010  . OSTEOPENIA 10/14/2009  . OVERACTIVE BLADDER 10/14/2009  . PAIN, CHRONIC NEC 06/06/2007  . POLYARTHRITIS 08/19/2007  . 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 WEIGHT GAIN 02/22/2009  . Diabetes mellitus without complication   . Lupus 2006  . Cervical cancer     History   Social History  . Marital Status: Single    Spouse Name: N/A    Number of Children: 1  . Years of Education: N/A   Occupational History  . DISABLED    Social History Main Topics  . Smoking status: Never Smoker   . Smokeless tobacco: Never Used  . Alcohol Use: No  . Drug Use: No  . Sexual Activity: Not on file   Other Topics Concern  . 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   She lives with daughter (12) and mother (18).          Past Surgical History  Procedure Laterality Date  . Appendectomy    . Tonsillectomy    . Wrist reconstruction    . Revision total hip arthroplasty  06-2009  . Breast surgery      Family History  Problem Relation Age of Onset  . Hypertension Mother   . Arthritis Mother   . Asthma Sister    . Asthma Daughter   . Diabetes Maternal Grandmother   . Heart disease Maternal Grandmother   . Colon cancer Maternal Uncle 65  . Pancreatic cancer Father   . Crohn's disease Maternal Aunt   . Diabetes Maternal Aunt     Allergies  Allergen Reactions  . Clarithromycin   . Compazine [Prochlorperazine Edisylate]   . Dhea [Nutritional Supplements]   . Fish Allergy   . Latex   . Penicillins   . Prasterone   . Promethazine Hcl   . Hydromorphone Hcl Rash and Hives    "Rash all over"    Current Outpatient Prescriptions on File Prior to Visit  Medication Sig Dispense Refill  . ABILIFY 15 MG tablet Take 15 mg by mouth daily.       Marland Kitchen albuterol (PROVENTIL HFA) 108 (90 BASE) MCG/ACT inhaler Inhale 2 puffs into the lungs every 4 (four) hours as needed for wheezing or shortness of breath.       . Ascorbic Acid (VITAMIN C GUMMIE PO) Take 4 tablets by mouth every morning.      Marland Kitchen aspirin 81 MG tablet Take 81 mg by mouth daily.      Marland Kitchen azaTHIOprine (IMURAN) 50 MG tablet Take 100 mg by mouth daily.      . Calcium-Phosphorus-Vitamin D (CALCIUM GUMMIES  PO) Take 2 tablets by mouth every morning.      . Cholecalciferol (VITAMIN D) 2000 UNITS CAPS Take 2,000 Units by mouth every morning.       . dapsone 25 MG tablet Take 25 mg by mouth every morning.       . diclofenac sodium (VOLTAREN) 1 % GEL Apply 2 g topically 4 (four) times daily.      . DULoxetine (CYMBALTA) 60 MG capsule Take 60 mg by mouth 2 (two) times daily.      . fentaNYL (DURAGESIC - DOSED MCG/HR) 75 MCG/HR Place 75 mcg onto the skin every 3 (three) days.      . folic acid (FOLVITE) 1 MG tablet Take 1 mg by mouth daily.      Marland Kitchen gabapentin (NEURONTIN) 100 MG capsule Take 1 capsule (100 mg total) by mouth at bedtime.  30 capsule  3  . levothyroxine (SYNTHROID, LEVOTHROID) 25 MCG tablet Take 1 tablet (25 mcg total) by mouth daily before breakfast.  90 tablet  1  . LORazepam (ATIVAN) 1 MG tablet Take 1-2 mg by mouth 3 (three) times daily. 1mg   am and afternoon and 2 mg at bedtime      . metaxalone (SKELAXIN) 800 MG tablet Take 800 mg by mouth 3 (three) times daily as needed for pain.      . metFORMIN (GLUCOPHAGE-XR) 500 MG 24 hr tablet Take 1 tablet (500 mg total) by mouth 2 (two) times daily.  180 tablet  3  . methotrexate 25 MG/ML injection Inject 25 mg into the skin once a week. On saturdays      . methylphenidate (CONCERTA) 27 MG CR tablet Take 27 mg by mouth every morning.      . Multiple Vitamins-Minerals (HM MULTIVITAMIN ADULT GUMMY PO) Take 2 tablets by mouth daily.      Marland Kitchen oxyCODONE-acetaminophen (PERCOCET) 10-325 MG per tablet Take 1 tablet by mouth every 6 (six) hours as needed for pain.       . pantoprazole (PROTONIX) 40 MG tablet Take one tablet by mouth twice daily  180 tablet  3  . predniSONE (DELTASONE) 10 MG tablet Take 20 mg by mouth daily.       . Pyridoxal-5 Phosphate POWD Take by mouth.      . topiramate (TOPAMAX) 100 MG tablet Take one tablet by mouth twice daily  180 tablet  1  . traMADol (ULTRAM) 50 MG tablet Take 1 tablet (50 mg total) by mouth every 8 (eight) hours as needed.  60 tablet  0  . triamcinolone cream (KENALOG) 0.1 % Apply 1 application topically 2 (two) times daily.  85.2 g  1  . valACYclovir (VALTREX) 500 MG tablet Take 500 mg by mouth at bedtime.        No current facility-administered medications on file prior to visit.    BP 102/74  Temp(Src) 98.1 F (36.7 C) (Oral)  Wt 200 lb (90.719 kg)  LMP 07/02/2014      Review of Systems  Constitutional: Negative.   HENT: Negative for congestion, dental problem, hearing loss, rhinorrhea, sinus pressure, sore throat and tinnitus.   Eyes: Negative for pain, discharge and visual disturbance.  Respiratory: Negative for cough and shortness of breath.   Cardiovascular: Negative for chest pain, palpitations and leg swelling.  Gastrointestinal: Negative for nausea, vomiting, abdominal pain, diarrhea, constipation, blood in stool and abdominal  distention.  Genitourinary: Positive for dysuria, urgency and flank pain. Negative for frequency, hematuria, vaginal bleeding, vaginal discharge, difficulty  urinating, vaginal pain and pelvic pain.  Musculoskeletal: Negative for arthralgias, gait problem and joint swelling.  Skin: Negative for rash.  Neurological: Negative for dizziness, syncope, speech difficulty, weakness, numbness and headaches.  Hematological: Negative for adenopathy.  Psychiatric/Behavioral: Negative for behavioral problems, dysphoric mood and agitation. The patient is not nervous/anxious.        Objective:   Physical Exam  Constitutional: She is oriented to person, place, and time. She appears well-developed and well-nourished.  HENT:  Head: Normocephalic.  Right Ear: External ear normal.  Left Ear: External ear normal.  Mouth/Throat: Oropharynx is clear and moist.  Eyes: Conjunctivae and EOM are normal. Pupils are equal, round, and reactive to light.  Neck: Normal range of motion. Neck supple. No thyromegaly present.  Cardiovascular: Normal rate, regular rhythm, normal heart sounds and intact distal pulses.   Pulmonary/Chest: Effort normal and breath sounds normal.  Abdominal: Soft. Bowel sounds are normal. She exhibits no mass. There is no tenderness.  Mild subjective left flank tenderness  Musculoskeletal: Normal range of motion.  Lymphadenopathy:    She has no cervical adenopathy.  Neurological: She is alert and oriented to person, place, and time.  Skin: Skin is warm and dry. No rash noted.  Psychiatric: She has a normal mood and affect. Her behavior is normal.          Assessment & Plan:   Urinary urgency and dysuria.  No evidence of a UTI.  History of LAD and interstitial cystitis.  Will observe at this time History of diabetes, well controlled, metformin therapy.  Last hemoglobin A1c 4 point 9.  Cardiac heart disease risk 0.  Risk and benefits of statin therapy discussed.  I agree that statin  therapy.  Not indicated  Recheck 5 months Followup rheumatology and neurology

## 2014-07-20 NOTE — Patient Instructions (Signed)
Drink as much fluid as you  can tolerate over the next few days  Call or return to clinic prn if these symptoms worsen or fail to improve as anticipated.  

## 2014-07-20 NOTE — Progress Notes (Signed)
Pre visit review using our clinic review tool, if applicable. No additional management support is needed unless otherwise documented below in the visit note. 

## 2014-07-26 ENCOUNTER — Telehealth: Payer: Self-pay | Admitting: Internal Medicine

## 2014-07-26 ENCOUNTER — Encounter: Payer: Self-pay | Admitting: Family Medicine

## 2014-07-26 ENCOUNTER — Ambulatory Visit (INDEPENDENT_AMBULATORY_CARE_PROVIDER_SITE_OTHER): Payer: Medicare Other | Admitting: Family Medicine

## 2014-07-26 VITALS — BP 124/70 | HR 100 | Temp 97.9°F | Wt 201.0 lb

## 2014-07-26 DIAGNOSIS — T148 Other injury of unspecified body region: Secondary | ICD-10-CM

## 2014-07-26 DIAGNOSIS — W57XXXA Bitten or stung by nonvenomous insect and other nonvenomous arthropods, initial encounter: Secondary | ICD-10-CM

## 2014-07-26 NOTE — Telephone Encounter (Signed)
Patient Information:  Caller Name: Aleyssa  Phone: [phone]  Patient: [name] [name], [name] [name]  Gender: Female  DOB: 20-Apr-1966  Age: 48 Years  PCP: Bluford Kaufmann (Family Practice > 42yrs old)  Pregnant: No  Office Follow Up:  Does the office need to follow up with this patient?: No  Instructions For The Office: N/A  RN Note:  Will schedule appt. for this pt. today with Dr. Elease Hashimoto at 16:00.  Symptoms  Reason For Call & Symptoms: Pt. found a deer tick on her Rt. thigh(07/25/14) Removed it and thinks the tick was a little swollen. Pt. has Lupus. Tick bite is a little red( size of pea). Was outside over the weekend and thinks it was attached for several days. Cleaned with alcohol. States the symptoms of Lyme Disease are close to the symptoms of Lupus and thinks she will not be able to tell if she has Lyme Disease or not. Worried. No bull's eye rash.  Reviewed Health History In EMR: Yes  Reviewed Medications In EMR: Yes  Reviewed Allergies In EMR: Yes  Reviewed Surgeries / Procedures: Yes  Date of Onset of Symptoms: 07/25/2014 OB / GYN:  LMP: 06/30/2014  Guideline(s) Used:  Tick Bite  Disposition Per Guideline:   See Today in Office  Reason For Disposition Reached:   Probable deer tick that was attached > 24 hours (or tick appears swollen, not flat)  Advice Given:  Tiny Deer Tick Removal:  Deer ticks are very small and need to be scraped off with a credit card edge or the edge of a knife blade.  Place tick in a sealed container (e.g., glass jar, Ziploc plastic bag), in case your doctor wants to see it.  Antibiotic Ointment:  Wash the wound and your hands with soap and water after removal to prevent catching any tick disease. Apply an over-the-counter antibiotic ointment (e.g., bacitracin) to the bite once.  Expected Course:  Tick bites normally do not itch or hurt. That is why they often go unnoticed.  Call Back If:  Fever or rash occur in the next 2 weeks  Bite begins  to look infected  You become worse.  Patient Will Follow Care Advice:  YES  Appointment Scheduled:  07/26/2014 16:00:00 Appointment Scheduled Provider:  Carolann Littler [facility])

## 2014-07-26 NOTE — Progress Notes (Signed)
Pre visit review using our clinic review tool, if applicable. No additional management support is needed unless otherwise documented below in the visit note. 

## 2014-07-26 NOTE — Telephone Encounter (Signed)
Noted  

## 2014-07-26 NOTE — Progress Notes (Signed)
   Subjective:    [name], female    DOB: Oct 22, 1966, 48 y.o.   MRN: [id number]  HPI Patient seen with tick bite last night. Location is right lateral thigh. She described this as a "deer tick". She thinks she removed the tick in entirety. Mild itching but no pain. No fever or chills. No headaches. No other skin rash.  Past Medical History  Diagnosis Date  . ANXIETY 06/06/2007  . BACK PAIN 02/22/2009  . DEPRESSION 06/06/2007  . FREQUENCY, URINARY 07/07/2007  . GERD 06/06/2007  . HYPOTHYROIDISM 06/06/2007  . INTERSTITIAL CYSTITIS 09/11/2010  . NEPHROLITHIASIS, HX OF 11/05/2010  . OSTEOPENIA 10/14/2009  . OVERACTIVE BLADDER 10/14/2009  . PAIN, CHRONIC NEC 06/06/2007  . POLYARTHRITIS 08/19/2007  . 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 WEIGHT GAIN 02/22/2009  . Diabetes mellitus without complication   . Lupus 2006  . Cervical cancer    Past Surgical History  Procedure Laterality Date  . Appendectomy    . Tonsillectomy    . Wrist reconstruction    . Revision total hip arthroplasty  06-2009  . Breast surgery      reports that she has never smoked. She has never used smokeless tobacco. She reports that she does not drink alcohol or use illicit drugs. family history includes Arthritis in her mother; Asthma in her daughter and sister; Colon cancer (age of onset: 52) in her maternal uncle; Crohn's disease in her maternal aunt; Diabetes in her maternal aunt and maternal grandmother; Heart disease in her maternal grandmother; Hypertension in her mother; Pancreatic cancer in her father. Allergies  Allergen Reactions  . Clarithromycin   . Compazine [Prochlorperazine Edisylate]   . Dhea [Nutritional Supplements]   . Fish Allergy   . Latex   . Penicillins   . Prasterone   . Promethazine Hcl   . Hydromorphone Hcl Rash and Hives    "Rash all over"      Review of Systems  Constitutional: Negative for fever and chills.       Objective:   Physical Exam    Constitutional: She appears well-developed and well-nourished.  Cardiovascular: Normal rate and regular rhythm.   Skin:  Right lateral thigh reveals very small eschar about 2 x 1 mm. Under magnification, no clear retained tick parts. No surrounding cellulitis changes. Nontender.          Assessment & Plan:  Tick bite right lateral thigh. No signs of secondary infection. We reviewed signs and symptoms of various tick fevers. Followup as needed.

## 2014-07-26 NOTE — Patient Instructions (Signed)
Tick Bite Information Ticks are insects that attach themselves to the skin and draw blood for food. There are various types of ticks. Common types include wood ticks and deer ticks. Most ticks live in shrubs and grassy areas. Ticks can climb onto your body when you make contact with leaves or grass where the tick is waiting. The most common places on the body for ticks to attach themselves are the scalp, neck, armpits, waist, and groin. Most tick bites are harmless, but sometimes ticks carry germs that cause diseases. These germs can be spread to a person during the tick's feeding process. The chance of a disease spreading through a tick bite depends on:   The type of tick.  Time of year.   How long the tick is attached.   Geographic location.  HOW CAN YOU PREVENT TICK BITES? Take these steps to help prevent tick bites when you are outdoors:  Wear protective clothing. Long sleeves and long pants are best.   Wear white clothes so you can see ticks more easily.  Tuck your pant legs into your socks.   If walking on a trail, stay in the middle of the trail to avoid brushing against bushes.  Avoid walking through areas with long grass.  Put insect repellent on all exposed skin and along boot tops, pant legs, and sleeve cuffs.   Check clothing, hair, and skin repeatedly and before going inside.   Brush off any ticks that are not attached.  Take a shower or bath as soon as possible after being outdoors.  WHAT IS THE PROPER WAY TO REMOVE A TICK? Ticks should be removed as soon as possible to help prevent diseases caused by tick bites. 1. If latex gloves are available, put them on before trying to remove a tick.  2. Using fine-point tweezers, grasp the tick as close to the skin as possible. You may also use curved forceps or a tick removal tool. Grasp the tick as close to its head as possible. Avoid grasping the tick on its body. 3. Pull gently with steady upward pressure until  the tick lets go. Do not twist the tick or jerk it suddenly. This may break off the tick's head or mouth parts. 4. Do not squeeze or crush the tick's body. This could force disease-carrying fluids from the tick into your body.  5. After the tick is removed, wash the bite area and your hands with soap and water or other disinfectant such as alcohol. 6. Apply a small amount of antiseptic cream or ointment to the bite site.  7. Wash and disinfect any instruments that were used.  Do not try to remove a tick by applying a hot match, petroleum jelly, or fingernail polish to the tick. These methods do not work and may increase the chances of disease being spread from the tick bite.  WHEN SHOULD YOU SEEK MEDICAL CARE? Contact your health care provider if you are unable to remove a tick from your skin or if a part of the tick breaks off and is stuck in the skin.  After a tick bite, you need to be aware of signs and symptoms that could be related to diseases spread by ticks. Contact your health care provider if you develop any of the following in the days or weeks after the tick bite:  Unexplained fever.  Rash. A circular rash that appears days or weeks after the tick bite may indicate the possibility of Lyme disease. The rash may resemble   a target with a bull's-eye and may occur at a different part of your body than the tick bite.  Redness and swelling in the area of the tick bite.   Tender, swollen lymph glands.   Diarrhea.   Weight loss.   Cough.   Fatigue.   Muscle, joint, or bone pain.   Abdominal pain.   Headache.   Lethargy or a change in your level of consciousness.  Difficulty walking or moving your legs.   Numbness in the legs.   Paralysis.  Shortness of breath.   Confusion.   Repeated vomiting.  Document Released: 11/13/2000 Document Revised: 09/06/2013 Document Reviewed: 04/26/2013 [employer] Patient Information 2015 [employer]. This information is  not intended to replace advice given to you by your health care provider. Make sure you discuss any questions you have with your health care provider.  

## 2014-07-30 ENCOUNTER — Encounter: Payer: Medicare Other | Admitting: Neurology

## 2014-09-04 ENCOUNTER — Telehealth: Payer: Self-pay | Admitting: Internal Medicine

## 2014-09-04 NOTE — Telephone Encounter (Signed)
Pt has lab order from rheumatologist dr docherty.  Is it ok for her to schedule?

## 2014-09-04 NOTE — Telephone Encounter (Signed)
done

## 2014-09-04 NOTE — Telephone Encounter (Signed)
Yes, just have her bring lab order with her.

## 2014-09-11 ENCOUNTER — Other Ambulatory Visit (INDEPENDENT_AMBULATORY_CARE_PROVIDER_SITE_OTHER): Payer: Medicare Other

## 2014-09-11 ENCOUNTER — Ambulatory Visit (INDEPENDENT_AMBULATORY_CARE_PROVIDER_SITE_OTHER): Payer: Medicare Other | Admitting: *Deleted

## 2014-09-11 DIAGNOSIS — M3500 Sicca syndrome, unspecified: Secondary | ICD-10-CM

## 2014-09-11 DIAGNOSIS — Z23 Encounter for immunization: Secondary | ICD-10-CM

## 2014-09-11 LAB — CBC WITH DIFFERENTIAL/PLATELET
Basophils Absolute: 0 10*3/uL (ref 0.0–0.1)
Basophils Relative: 0.2 % (ref 0.0–3.0)
EOS ABS: 0.1 10*3/uL (ref 0.0–0.7)
Eosinophils Relative: 2.3 % (ref 0.0–5.0)
HEMATOCRIT: 39.1 % (ref 36.0–46.0)
Hemoglobin: 12.5 g/dL (ref 12.0–15.0)
LYMPHS ABS: 1.5 10*3/uL (ref 0.7–4.0)
Lymphocytes Relative: 32.4 % (ref 12.0–46.0)
MCHC: 31.9 g/dL (ref 30.0–36.0)
MCV: 97 fl (ref 78.0–100.0)
MONO ABS: 0.3 10*3/uL (ref 0.1–1.0)
Monocytes Relative: 6.6 % (ref 3.0–12.0)
Neutro Abs: 2.8 10*3/uL (ref 1.4–7.7)
Neutrophils Relative %: 58.5 % (ref 43.0–77.0)
PLATELETS: 162 10*3/uL (ref 150.0–400.0)
RBC: 4.03 Mil/uL (ref 3.87–5.11)
RDW: 15.7 % — AB (ref 11.5–15.5)
WBC: 4.8 10*3/uL (ref 4.0–10.5)

## 2014-09-11 LAB — BASIC METABOLIC PANEL
BUN: 23 mg/dL (ref 6–23)
CHLORIDE: 112 meq/L (ref 96–112)
CO2: 21 meq/L (ref 19–32)
Calcium: 8.9 mg/dL (ref 8.4–10.5)
Creatinine, Ser: 1 mg/dL (ref 0.4–1.2)
GFR: 61.69 mL/min (ref >60.00)
GLUCOSE: 110 mg/dL — AB (ref 70–99)
POTASSIUM: 3.2 meq/L — AB (ref 3.5–5.1)
Sodium: 139 mEq/L (ref 135–145)

## 2014-09-11 LAB — ALT: ALT: 18 U/L (ref 0–35)

## 2014-09-11 LAB — HIGH SENSITIVITY CRP: CRP HIGH SENSITIVITY: 1.02 mg/L (ref 0.000–5.000)

## 2014-09-14 ENCOUNTER — Other Ambulatory Visit: Payer: Self-pay

## 2014-09-18 ENCOUNTER — Ambulatory Visit (INDEPENDENT_AMBULATORY_CARE_PROVIDER_SITE_OTHER): Payer: Medicare Other | Admitting: Neurology

## 2014-09-18 ENCOUNTER — Encounter: Payer: Self-pay | Admitting: Neurology

## 2014-09-18 VITALS — BP 110/70 | HR 108 | Ht 70.0 in | Wt 187.0 lb

## 2014-09-18 DIAGNOSIS — R269 Unspecified abnormalities of gait and mobility: Secondary | ICD-10-CM

## 2014-09-18 DIAGNOSIS — G959 Disease of spinal cord, unspecified: Secondary | ICD-10-CM

## 2014-09-18 DIAGNOSIS — M329 Systemic lupus erythematosus, unspecified: Secondary | ICD-10-CM

## 2014-09-18 DIAGNOSIS — G5622 Lesion of ulnar nerve, left upper limb: Secondary | ICD-10-CM

## 2014-09-18 MED ORDER — PREGABALIN 50 MG PO CAPS: 50.0000 mg | ORAL_CAPSULE | Freq: Every day | ORAL | Status: DC

## 2014-09-18 MED ORDER — PREGABALIN 50 MG PO CAPS
50.0000 mg | ORAL_CAPSULE | Freq: Every day | ORAL | Status: DC
Start: 2014-09-18 — End: 2017-01-29

## 2014-09-18 NOTE — Progress Notes (Signed)
Follow-up Visit   Date: 09/18/2014    [name] [name] MRN: [id number] DOB: 1966-08-07   Interim History: [name] [name] is a 48 y.o. 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   History of present illness: Starting in March 2015, she developed intermittent bilateral feet numbness worse at night and would wake her from sleeping. Over a month, symptoms migrated to her left shoulder and hand. She saw orthopaedic surgery, Dr. Gerrit Heck, who recommended using the wrist splint. She also saw her rheumatologist, Dr. Johnna Acosta in [place], [place] and was given steroid injection which improved numbness of her feet and shoulder, but hand paresthesias remained unchanged. He also increased her prednisone to 20mg  and recently tapered it to 10mg  (04/20/2014) without worsening of symptoms. She has been wearing a wrist splint for the past month, which has helped her hand swelling. Hand numbness is constant and involves her first three fingers. It is worse with finger manipulations and not improved by anything.   She denies any neck or back pain. She has chronic polyarthralgias from lupus and is taking percocet and fentanyl 20mcg patch. No weakness of the hands or feet. She has a long history of bilateral hand tremors which has become worse.   She reports having history of left optic neuritis (2006) treated with IV steroids. MRI brain and several lumbar punctures was negative for demyelinating changes. In 2007, she had mononeuritis multiplex with residual left lateral leg sensory loss and gait abnormalities. EMG was done at [facility] in [place], [place]  (results not available). She has been ambulating with a cane since 2006.  - Follow-up 06/19/2014: She is here to discuss results of EMG which showed left ulnar neuropathy at the elbow.  She denies resting her elbow on surfaces or having habit over flexing her elbows often.  She also report being diagnosed with sicca syndrome by opthalmologist.  Her rheumatologist also increased her prednisone 20mg  daily for flare up of lupus.    UPDATE 09/18/2014:  She was switched off neurontin due to sedation to Lyrica which completely alleviated her left hand pain.  She is requesting occupational therapy for her left hand weakness.  No new neurological complaints.  She tells me that her daughter has been diagnosed with fibromyalgia so she is busy managing her doctors visits to [facility].   Medications:  Current Outpatient Prescriptions on File Prior to Visit  Medication Sig Dispense Refill  . ABILIFY 15 MG tablet Take 15 mg by mouth daily.       Marland Kitchen albuterol (PROVENTIL HFA) 108 (90 BASE) MCG/ACT inhaler Inhale 2 puffs into the lungs every 4 (four) hours as needed for wheezing or shortness of breath.       . Ascorbic Acid (VITAMIN C GUMMIE PO) Take 4 tablets by mouth every morning.      Marland Kitchen aspirin 81 MG tablet Take 81 mg by mouth daily.      Marland Kitchen azaTHIOprine (IMURAN) 50 MG tablet Take 100 mg by mouth daily.      . Calcium-Phosphorus-Vitamin D (CALCIUM GUMMIES PO) Take 2 tablets by mouth every morning.      . Cholecalciferol (VITAMIN D) 2000 UNITS CAPS Take 2,000 Units by mouth every morning.       Marland Kitchen  dapsone 25 MG tablet Take 25 mg by mouth every morning.       . diclofenac sodium (VOLTAREN) 1 % GEL Apply 2 g topically 4 (four) times daily.      . DULoxetine (CYMBALTA) 60 MG capsule Take 60 mg by mouth 2 (two) times daily.      . fentaNYL (DURAGESIC - DOSED MCG/HR) 75 MCG/HR Place 75 mcg onto the skin every 3 (three) days.      . folic acid (FOLVITE) 1 MG tablet Take 1 mg by mouth daily.      Marland Kitchen levothyroxine (SYNTHROID,  LEVOTHROID) 25 MCG tablet Take 1 tablet (25 mcg total) by mouth daily before breakfast.  90 tablet  1  . LORazepam (ATIVAN) 1 MG tablet Take 1-2 mg by mouth 3 (three) times daily. 1mg  am and afternoon and 2 mg at bedtime      . metFORMIN (GLUCOPHAGE-XR) 500 MG 24 hr tablet Take 1 tablet (500 mg total) by mouth 2 (two) times daily.  180 tablet  3  . methotrexate 25 MG/ML injection Inject 25 mg into the skin once a week. On saturdays      . methylphenidate (CONCERTA) 27 MG CR tablet Take 27 mg by mouth every morning.      . Multiple Vitamins-Minerals (HM MULTIVITAMIN ADULT GUMMY PO) Take 2 tablets by mouth daily.      Marland Kitchen oxyCODONE-acetaminophen (PERCOCET) 10-325 MG per tablet Take 1 tablet by mouth every 6 (six) hours as needed for pain.       . pantoprazole (PROTONIX) 40 MG tablet Take one tablet by mouth twice daily  180 tablet  3  . predniSONE (DELTASONE) 10 MG tablet Take 10 mg by mouth daily.       . Pyridoxal-5 Phosphate POWD Take by mouth.      . topiramate (TOPAMAX) 100 MG tablet Take one tablet by mouth twice daily  180 tablet  1  . traMADol (ULTRAM) 50 MG tablet Take 1 tablet (50 mg total) by mouth every 8 (eight) hours as needed.  60 tablet  0  . triamcinolone cream (KENALOG) 0.1 % Apply 1 application topically 2 (two) times daily.  85.2 g  1  . valACYclovir (VALTREX) 500 MG tablet Take 500 mg by mouth at bedtime.        No current facility-administered medications on file prior to visit.    Allergies:  Allergies  Allergen Reactions  . Clarithromycin   . Compazine [Prochlorperazine Edisylate]   . Dhea [Nutritional Supplements]   . Fish Allergy   . Latex   . Penicillins   . Prasterone   . Promethazine Hcl   . Hydromorphone Hcl Rash and Hives    "Rash all over"     Review of Systems:  CONSTITUTIONAL: No fevers, chills, night sweats, or weight loss.   EYES: + visual changes or eye pain ENT: No hearing changes.  No history of nose bleeds.   RESPIRATORY: No cough, wheezing  and shortness of breath.   CARDIOVASCULAR: Negative for chest pain, and palpitations.   GI: Negative for abdominal discomfort, blood in stools or black stools.  No recent change in bowel habits.   GU:  No history of incontinence.   MUSCLOSKELETAL: + history of joint pain or swelling.  + myalgias.   SKIN: Negative for lesions, rash, and itching.   ENDOCRINE: Negative for cold or heat intolerance, polydipsia or goiter.   PSYCH:  + depression or anxiety symptoms.   NEURO: As Above.  Vital Signs:  BP 110/70  Pulse 108  Ht 5\' 10"  (1.778 m)  Wt 187 lb (84.823 kg)  BMI 26.83 kg/m2  SpO2 95%  Neurological Exam: MENTAL STATUS including orientation to time, place, person, recent and remote memory, attention span and concentration, language, and fund of knowledge is normal.  Speech is not dysarthric.  CRANIAL NERVES:    Face is symmetric.  MOTOR:  Motor strength is 5/5 in all extremities, intermittent give-way weakness of the hands.  Bilateral lower extremity with increased tone (0+).  Bilateral hand and feet (R >L) postural tremor. No pronator drift.     MSRs:  Right      Left  brachioradialis  3+   brachioradialis  tr   biceps  3+   biceps  2+   triceps  3+   triceps  2+   patellar  3+   patellar  3+    COORDINATION/GAIT: Gait is wide-based, antalgic, and appears slightly spastic.  Data: EMG left upper and lower extremity 06/15/2014: 1. Left ulnar neuropathy with slowing across the elbow, demyelinating and axon loss in type. 2. Left chronic L5 radiculopathy, mild in degree electrically. 3. There is no evidence of a generalized sensorimotor polyneuropathy or a cervical radiculopathy affecting the left side. Previous electrodiagnostic studies are not available to compare.   MRI cervical spine wwo contrast 05/04/2014:Minimal disc bulge is C5-C6 and C6-C7 some bulking ligamentum flavum labrum. This may be exacerbated by extension of the head in the head holder. No significant cord  compression. Neural foramen are patent throughout  MRI thoracic spine 07/05/2014:  Unremarkable  MRI brain wwo contrast 07/05/2014:  Negative  [place] ulnar nerve at [facility] 07/06/2014:  Ultrasound graphic findings could be indicative of the left ulnar neuropathy at the elbow. There is an enlarged hypoechoic ulnar nerve at the elbow with no subluxation.   IMPRESSION: 1.  Left ulnar neuropathy at the elbow  - Clinically improved after switching from neurontin to Lyrica 50mg , but continues to have residual numbness  - She also is requesting referral for OT because of difficulty extending and flexing at the wrist  2.  History of lupus on chronic immunosuppression complicated by left optic neuritis and mononeuritis multiplex (subjective sensory loss over left superficial peroneal nerve distribution).   3.  Generalized hyperreflexia, ?history of myelopathy  - Her exam is also notable for brisk reflexes throughout (R >L), except trace at left C6 and slightly increased tone in the legs with spastic gait pattern.  - MRI cervical spine showed minimal disc bulge at C5-6 and C6-7 without cord abnormalities.  - MRI thoracic spine and brain is unrevealing  PLAN: 1.  Refills provided for Lyrica 50mg  daily 2.  Referral for occupational therapy 3.  Return to clinic as needed   The duration of this appointment visit was 20 minutes of face-to-face time with the patient.  Greater than 50% of this time was spent in counseling, explanation of diagnosis, planning of further management, and coordination of care.   Thank you for allowing me to participate in patient's care.  If I can answer any additional questions, I would be pleased to do so.    Sincerely,    Zareth Rippetoe K. Posey Pronto, DO

## 2014-09-18 NOTE — Patient Instructions (Signed)
1.  Continue Lyrica 50mg  one tablet daily  2.  We will send referral for occupational therapy and they will contact you with appointment 3.  Return to clinic as needed

## 2014-09-18 NOTE — Progress Notes (Signed)
Done

## 2014-09-24 ENCOUNTER — Emergency Department: Payer: Self-pay | Admitting: Emergency Medicine

## 2014-09-25 LAB — CBC
HCT: 42.1 % (ref 35.0–47.0)
HGB: 13.4 g/dL (ref 12.0–16.0)
MCH: 31.8 pg (ref 26.0–34.0)
MCHC: 31.8 g/dL — AB (ref 32.0–36.0)
MCV: 100 fL (ref 80–100)
PLATELETS: 168 10*3/uL (ref 150–440)
RBC: 4.22 10*6/uL (ref 3.80–5.20)
RDW: 15.9 % — ABNORMAL HIGH (ref 11.5–14.5)
WBC: 8.6 10*3/uL (ref 3.6–11.0)

## 2014-09-25 LAB — URINALYSIS, COMPLETE
Bilirubin,UR: NEGATIVE
Blood: NEGATIVE
GLUCOSE, UR: NEGATIVE mg/dL (ref 0–75)
KETONE: NEGATIVE
Leukocyte Esterase: NEGATIVE
Nitrite: NEGATIVE
Ph: 6 (ref 4.5–8.0)
Protein: NEGATIVE
Specific Gravity: 1.023 (ref 1.003–1.030)
Squamous Epithelial: 1
WBC UR: <1 /HPF (ref 0–5)

## 2014-09-25 LAB — COMPREHENSIVE METABOLIC PANEL
ALK PHOS: 55 U/L
ALT: 27 U/L
Albumin: 3.9 g/dL (ref 3.4–5.0)
Anion Gap: 7 (ref 7–16)
BUN: 21 mg/dL — ABNORMAL HIGH (ref 7–18)
Bilirubin,Total: 0.4 mg/dL (ref 0.2–1.0)
Calcium, Total: 8.4 mg/dL — ABNORMAL LOW (ref 8.5–10.1)
Chloride: 110 mmol/L — ABNORMAL HIGH (ref 98–107)
Co2: 24 mmol/L (ref 21–32)
Creatinine: 0.93 mg/dL (ref 0.60–1.30)
EGFR (Non-African Amer.): >60
Glucose: 121 mg/dL — ABNORMAL HIGH (ref 65–99)
Osmolality: 285 (ref 275–301)
Potassium: 4.2 mmol/L (ref 3.5–5.1)
SGOT(AST): 26 U/L (ref 15–37)
SODIUM: 141 mmol/L (ref 136–145)
TOTAL PROTEIN: 6.6 g/dL (ref 6.4–8.2)

## 2014-09-25 LAB — DRUG SCREEN, URINE
AMPHETAMINES, UR SCREEN: NEGATIVE (ref ?–1000)
Barbiturates, Ur Screen: NEGATIVE (ref ?–200)
Benzodiazepine, Ur Scrn: NEGATIVE (ref ?–200)
Cannabinoid 50 Ng, Ur ~~LOC~~: NEGATIVE (ref ?–50)
Cocaine Metabolite,Ur ~~LOC~~: NEGATIVE (ref ?–300)
MDMA (ECSTASY) UR SCREEN: NEGATIVE (ref ?–500)
Methadone, Ur Screen: NEGATIVE (ref ?–300)
Opiate, Ur Screen: NEGATIVE (ref ?–300)
PHENCYCLIDINE (PCP) UR S: NEGATIVE (ref ?–25)
Tricyclic, Ur Screen: NEGATIVE (ref ?–1000)

## 2014-09-25 LAB — ETHANOL: Ethanol: <3 mg/dL

## 2014-10-01 ENCOUNTER — Encounter: Payer: Self-pay | Admitting: Neurology

## 2014-11-30 LAB — HM MAMMOGRAPHY

## 2014-12-17 ENCOUNTER — Ambulatory Visit: Payer: Self-pay | Admitting: Gastroenterology

## 2014-12-27 ENCOUNTER — Ambulatory Visit: Payer: Self-pay | Admitting: Obstetrics and Gynecology

## 2015-01-11 ENCOUNTER — Ambulatory Visit: Payer: Self-pay | Admitting: Obstetrics and Gynecology

## 2015-02-05 ENCOUNTER — Ambulatory Visit: Payer: Self-pay | Admitting: Gastroenterology

## 2015-02-27 ENCOUNTER — Ambulatory Visit
Admit: 2015-02-27 | Disposition: A | Discharge status: Home / Self Care | Payer: Self-pay | Attending: Internal Medicine | Admitting: Internal Medicine

## 2015-03-22 NOTE — Consult Note (Signed)
Brief Consult Note: Diagnosis: Epigastric abdominal pain.  Known history of gastric ulcers.  Daily NSAID use.  Depression.  Lupus.  Hypothyroidism.  Diabetes Mellitus..   Consult note dictated.   Discussed with Attending MD.   Comments: Patient's presentation discussed with Dr. Verdie Shire.  Recommendation is to proceed with EGD to allow direct luminal evaluation of upper GI tract.  Epigastric abdominal pain for the past three weeks.  Known history of ulcer disease.  Increase risk for reoccurrence of ulcers given chronic NSAID use.  Will proceed with EGD on Monday 09/11/2013.  Will continue to monitor.  Continue with PPI therapy.  Encourage the least amount of Volarten use given current symptoms and concern for ulcer disease..  Electronic Signatures: Payton Emerald (NP)  (Signed 10-Oct-14 15:49)  Authored: Brief Consult Note   Last Updated: 10-Oct-14 15:49 by Payton Emerald (NP)

## 2015-03-22 NOTE — Consult Note (Signed)
Pt seen and examined. Please see [name] [name]'s notes. Known hx of gastric ulcers. Admitted with severe depression. Also, has abd pain. Tender in epigastrum. Was supposed to have EGD today in [place]. Pt on voltaren/ASA/prednisone. Try to hold voltaren and ASA if possible over the weekend. Continue protonix bid. Plan EGD on Monday. Thanks.  Electronic Signatures: Verdie Shire (MD)  (Signed on 10-Oct-14 15:47)  Authored  Last Updated: 10-Oct-14 15:47 by Verdie Shire (MD)

## 2015-03-22 NOTE — Consult Note (Signed)
Abd pain persists though less. EGD showed gastritis. Bx's taken. Since patient has to take voltaren for her lupus, continue protonix bid upon discharge. Will sign off. Pt can f/u with [place] later if abd pain persists. Will sign off. Thanks.  Electronic Signatures: Verdie Shire (MD)  (Signed on 13-Oct-14 18:00)  Authored  Last Updated: 13-Oct-14 18:00 by Verdie Shire (MD)

## 2015-03-22 NOTE — Consult Note (Signed)
PATIENT NAME:  [name] [name], PEADEN MR#:  [id number] DATE OF BIRTH:  06/20/1966  DATE OF CONSULTATION:  09/08/2013  REFERRING PHYSICIAN:   CONSULTING PHYSICIAN:  Payton Emerald, NP/Paul Oh, MD  ATTENDING:  Dr. Bary Leriche.  REASON FOR CONSULT:  Epigastric pain, a known history of ulcers.   HISTORY OF PRESENT ILLNESS:  [name] is a 49 year old female who presented to [facility] Emergency Room via a police escort. The patient states she was seen by her internist yesterday who became concerned with her mental state as she has a known history of depression, anxiety. Police were called to escort involuntary commitment. Psychiatric H and P is not available to review at this time. The patient denies any suicidal or homicidal thoughts. She has a known history of major depression and anxiety, and states that she has been hospitalized for this before in the past. Medical history is also significant for lupus, osteoporosis, hypothyroidism, IBS, asthma, diabetes and cervical dysplasia. The patient has been experiencing epigastric abdominal discomfort for the past 3 weeks. States she was seen in the Emergency Room and had a CT scan of abdomen and pelvis done, which was unremarkable. Her primary doctor is through [employer], states that she has been on Protonix 40 mg twice a day for at least the past 5 years, recently sucralfate and Reglan was added, has not noticed any real improvement with her symptoms. She again had followed up with her primary doctor for this reason yesterday and actually was to have had an upper endoscopy done with a gastroenterologist through [facility] today. The patient states that eating as well as taking medications cause the abdominal pain to worsen, normally it is a dull pain, will exacerbate to being sharp. She has not noticed any evidence of melena or bright-red blood. Bowels have been moving on a regular basis at this time. Normally she has a bowel pattern between  constipation and diarrhea. Appetite has been fair, weight has been stable.   PAST MEDICAL HISTORY:  Depression, anxiety, lupus, osteoporosis, hypothyroidism, IBS, asthma, diabetes, and cervical dysplasia status post LEEP.  PAST SURGICAL HISTORY: 1.  Left metatarsal surgery. Surgery for appears to be stress fracture.  2.  Cholecystectomy.  3.  Appendectomy.  4.  Left wrist surgery.  5.  Tonsillectomy.   FAMILY HISTORY:  Uncle, a history of colon cancer diagnosed at the age of 19. No other forms of neoplasm.   SOCIAL HISTORY:  No tobacco, no alcohol use. 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   REVIEW OF SYSTEMS:  All 10 systems reviewed and checked, otherwise unremarkable, as stated above, significant for major depression.   PHYSICAL EXAMINATION:  VITAL SIGNS:  Temperature is 97.9 with a pulse of 80, respirations are 18, blood pressure not documented in EMR at the time of this dictation.  GENERAL:  Well developed, well nourished, 49 year old female resting what appears to be comfortably in her bed.  HEENT:  Normocephalic, atraumatic. Pupils equal, reactive to light. Conjunctivae clear. Sclerae anicteric.  NECK:  Supple. Trachea midline.  PULMONARY:  Symmetric rise and fall of chest. Clear to auscultation throughout.  CARDIOVASCULAR:  Regular rhythm, S1, S2. No murmurs, no gallops.  ABDOMEN:  Soft, nondistended. Bowel sounds in 4 quadrants, marked discomfort epigastric. No rebound tenderness. No evidence of hepatosplenomegaly.  RECTAL:  Deferred.  MUSCULOSKELETAL:  Movement of all 4 extremities. No contractures.  EXTREMITIES:  No edema.  NEUROLOGICAL:  No gross neurological deficits.  PSYCHIATRIC:  Flat affect. Depressive  mood. Alert and oriented x 4.   LABORATORY, DIAGNOSTIC, AND RADIOLOGICAL DATA:  Chemistry panel:  Glucose 152, anion gap is 6, otherwise within normal limits. Hepatic panel within normal limits. TSH 1.76. Urine drug screen is positive for opioids. CBC  within normal limits except RDW elevated at 18.4. Urinalysis +3 blood, protein is 100 mg/dL, RBC 3184 per high-power field, WBCs is 104 per high-power field, +1 bacteria.   IMPRESSION:  Epigastric abdominal pain, no history of gastric ulcers, daily NSAID use, depression, lupus, hypothyroidism.   PLAN:  The patient's presentation was discussed with Dr. Verdie Shire. Recommendation is to proceed EGD to allow direct luminal evaluation of upper GI tract, epigastric abdominal pain for the past three weeks in the setting of known history of ulcer disease as well as NSAID use thus putting her at increased risk for recurrence of ulcers. We will proceed with EGD on Monday, 09/11/2013. We will continue to monitor. Continue PPI therapy as ordered. Encourage the least amount of Voltaren use at this time given her current GI symptoms and the suspicion for ulcer disease.   These services provided by Payton Emerald, NP, under collaborative agreement with Dr. Verdie Shire.   ____________________________ Payton Emerald, NP dsh:jm D: 09/08/2013 15:48:52 ET T: 09/08/2013 16:30:33 ET JOB#: [id number]  cc: Payton Emerald, NP, <Dictator> Payton Emerald MD ELECTRONICALLY SIGNED 09/12/2013 13:13
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
## 2015-03-25 LAB — SURGICAL PATHOLOGY

## 2015-03-27 ENCOUNTER — Other Ambulatory Visit: Payer: Self-pay | Admitting: Family Medicine

## 2015-03-27 DIAGNOSIS — M810 Age-related osteoporosis without current pathological fracture: Secondary | ICD-10-CM

## 2015-04-03 ENCOUNTER — Ambulatory Visit
Admission: RE | Admit: 2015-04-03 | Discharge: 2015-04-03 | Disposition: A | Discharge status: Home / Self Care | Payer: Medicare Other | Source: Ambulatory Visit | Attending: Family Medicine | Admitting: Family Medicine

## 2015-04-03 DIAGNOSIS — M8580 Other specified disorders of bone density and structure, unspecified site: Secondary | ICD-10-CM | POA: Insufficient documentation

## 2015-04-03 DIAGNOSIS — Z13820 Encounter for screening for osteoporosis: Secondary | ICD-10-CM | POA: Insufficient documentation

## 2015-04-03 DIAGNOSIS — M810 Age-related osteoporosis without current pathological fracture: Secondary | ICD-10-CM

## 2015-05-21 ENCOUNTER — Encounter: Payer: Self-pay | Admitting: *Deleted

## 2015-05-27 ENCOUNTER — Other Ambulatory Visit: Payer: Self-pay

## 2015-09-16 ENCOUNTER — Emergency Department
Admission: EM | Admit: 2015-09-16 | Discharge: 2015-09-17 | Disposition: A | Discharge status: Home / Self Care | Payer: Medicare Other | Attending: Emergency Medicine | Admitting: Emergency Medicine

## 2015-09-16 ENCOUNTER — Emergency Department: Payer: Medicare Other

## 2015-09-16 ENCOUNTER — Encounter: Payer: Self-pay | Admitting: Emergency Medicine

## 2015-09-16 DIAGNOSIS — Z79891 Long term (current) use of opiate analgesic: Secondary | ICD-10-CM | POA: Insufficient documentation

## 2015-09-16 DIAGNOSIS — Z91040 Latex allergy status: Secondary | ICD-10-CM | POA: Diagnosis not present

## 2015-09-16 DIAGNOSIS — M549 Dorsalgia, unspecified: Secondary | ICD-10-CM | POA: Diagnosis not present

## 2015-09-16 DIAGNOSIS — Z880 Allergy status to penicillin: Secondary | ICD-10-CM | POA: Insufficient documentation

## 2015-09-16 DIAGNOSIS — Z79899 Other long term (current) drug therapy: Secondary | ICD-10-CM | POA: Diagnosis not present

## 2015-09-16 DIAGNOSIS — R1011 Right upper quadrant pain: Secondary | ICD-10-CM | POA: Insufficient documentation

## 2015-09-16 DIAGNOSIS — Z791 Long term (current) use of non-steroidal anti-inflammatories (NSAID): Secondary | ICD-10-CM | POA: Diagnosis not present

## 2015-09-16 DIAGNOSIS — Z7982 Long term (current) use of aspirin: Secondary | ICD-10-CM | POA: Insufficient documentation

## 2015-09-16 DIAGNOSIS — R079 Chest pain, unspecified: Secondary | ICD-10-CM

## 2015-09-16 DIAGNOSIS — E119 Type 2 diabetes mellitus without complications: Secondary | ICD-10-CM | POA: Diagnosis not present

## 2015-09-16 LAB — LIPASE, BLOOD: Lipase: 30 U/L (ref 22–51)

## 2015-09-16 LAB — CBC
HEMATOCRIT: 40 % (ref 35.0–47.0)
HEMOGLOBIN: 13 g/dL (ref 12.0–16.0)
MCH: 29 pg (ref 26.0–34.0)
MCHC: 32.6 g/dL (ref 32.0–36.0)
MCV: 88.9 fL (ref 80.0–100.0)
Platelets: 146 10*3/uL — ABNORMAL LOW (ref 150–440)
RBC: 4.5 MIL/uL (ref 3.80–5.20)
RDW: 14.5 % (ref 11.5–14.5)
WBC: 8.7 10*3/uL (ref 3.6–11.0)

## 2015-09-16 LAB — HEPATIC FUNCTION PANEL
ALBUMIN: 3.8 g/dL (ref 3.5–5.0)
ALK PHOS: 57 U/L (ref 38–126)
ALT: 19 U/L (ref 14–54)
AST: 21 U/L (ref 15–41)
Bilirubin, Direct: <0.1 mg/dL — ABNORMAL LOW (ref 0.1–0.5)
Total Bilirubin: 0.4 mg/dL (ref 0.3–1.2)
Total Protein: 6.2 g/dL — ABNORMAL LOW (ref 6.5–8.1)

## 2015-09-16 LAB — BASIC METABOLIC PANEL
ANION GAP: 7 (ref 5–15)
BUN: 22 mg/dL — ABNORMAL HIGH (ref 6–20)
CALCIUM: 9 mg/dL (ref 8.9–10.3)
CO2: 26 mmol/L (ref 22–32)
Chloride: 109 mmol/L (ref 101–111)
Creatinine, Ser: 0.9 mg/dL (ref 0.44–1.00)
Glucose, Bld: 104 mg/dL — ABNORMAL HIGH (ref 65–99)
POTASSIUM: 3.5 mmol/L (ref 3.5–5.1)
SODIUM: 142 mmol/L (ref 135–145)

## 2015-09-16 LAB — TROPONIN I

## 2015-09-16 LAB — FIBRIN DERIVATIVES D-DIMER (ARMC ONLY): Fibrin derivatives D-dimer (ARMC): 339 (ref 0–499)

## 2015-09-16 MED ORDER — ONDANSETRON HCL 4 MG/2ML IJ SOLN
4.0000 mg | Freq: Once | INTRAMUSCULAR | Status: AC
  Administered 2015-09-16: 4 mg via INTRAVENOUS
  Filled 2015-09-16: qty 2

## 2015-09-16 MED ORDER — SODIUM CHLORIDE 0.9 % IV BOLUS (SEPSIS)
1000.0000 mL | Freq: Once | INTRAVENOUS | Status: AC
  Administered 2015-09-16: 1000 mL via INTRAVENOUS

## 2015-09-16 MED ORDER — HYDROCODONE-ACETAMINOPHEN 5-325 MG PO TABS: 1.0000 | ORAL_TABLET | ORAL | Status: DC | PRN

## 2015-09-16 MED ORDER — MORPHINE SULFATE (PF) 4 MG/ML IV SOLN
4.0000 mg | Freq: Once | INTRAVENOUS | Status: AC
  Administered 2015-09-16: 4 mg via INTRAVENOUS
  Filled 2015-09-16: qty 1

## 2015-09-16 NOTE — Discharge Instructions (Signed)
Please call cardiology at the number provided to arrange a stress test as soon as possible. Return to the emergency department for any worsening chest pain, trouble breathing, or any other symptom personally concerning to yourself.    Nonspecific Chest Pain It is often hard to find the cause of chest pain. There is always a chance that your pain could be related to something serious, such as a heart attack or a blood clot in your lungs. Chest pain can also be caused by conditions that are not life-threatening. If you have chest pain, it is very important to follow up with your doctor.  HOME CARE  If you were prescribed an antibiotic medicine, finish it all even if you start to feel better.  Avoid any activities that cause chest pain.  Do not use any tobacco products, including cigarettes, chewing tobacco, or electronic cigarettes. If you need help quitting, ask your doctor.  Do not drink alcohol.  Take medicines only as told by your doctor.  Keep all follow-up visits as told by your doctor. This is important. This includes any further testing if your chest pain does not go away.  Your doctor may tell you to keep your head raised (elevated) while you sleep.  Make lifestyle changes as told by your doctor. These may include:  Getting regular exercise. Ask your doctor to suggest some activities that are safe for you.  Eating a heart-healthy diet. Your doctor or a diet specialist (dietitian) can help you to learn healthy eating options.  Maintaining a healthy weight.  Managing diabetes, if necessary.  Reducing stress. GET HELP IF:  Your chest pain does not go away, even after treatment.  You have a rash with blisters on your chest.  You have a fever. GET HELP RIGHT AWAY IF:  Your chest pain is worse.  You have an increasing cough, or you cough up blood.  You have severe belly (abdominal) pain.  You feel extremely weak.  You pass out (faint).  You have chills.  You  have sudden, unexplained chest discomfort.  You have sudden, unexplained discomfort in your arms, back, neck, or jaw.  You have shortness of breath at any time.  You suddenly start to sweat, or your skin gets clammy.  You feel nauseous.  You vomit.  You suddenly feel light-headed or dizzy.  Your heart begins to beat quickly, or it feels like it is skipping beats. These symptoms may be an emergency. Do not wait to see if the symptoms will go away. Get medical help right away. Call your local emergency services (911 in the [place] Do not drive yourself to the hospital.   This information is not intended to replace advice given to you by your health care provider. Make sure you discuss any questions you have with your health care provider.   Document Released: 05/04/2008 Document Revised: 12/07/2014 Document Reviewed: 06/22/2014 Elsevier Interactive Patient Education [employer].

## 2015-09-16 NOTE — ED Notes (Signed)
Patient transported to Ultrasound 

## 2015-09-16 NOTE — ED Notes (Signed)
Patient back from Ultrasound.

## 2015-09-16 NOTE — ED Provider Notes (Signed)
[facility] Emergency Department Provider Note  Time seen: 10:09 PM  I have reviewed the triage vital signs and the nursing notes.   HISTORY  Chief Complaint Chest Pain    HPI [name] [name] is a 49 y.o. female with a past medical history of anxiety, depression, chronic pain, arthritis, diabetes, lupus, presents to the emergency department with right chest pain. According to the patient for the past 2 weeks she has had right chest pain, somewhat worse when she moves or takes a deep breath. States she has had this pain before but it comes and goes and has never been constant like this. Denies any recent cough or congestion or fever. Denies any sputum. States nausea but denies vomiting, diarrhea, constipation, dysuria. Patient denies any association with food. Patient states she has her appendix out, had her gallbladder out 3 years ago. Describes her pain as moderate currently. Dull/aching sensation in the right lower chest.    Past Medical History  Diagnosis Date  . ANXIETY 06/06/2007  . BACK PAIN 02/22/2009  . DEPRESSION 06/06/2007  . FREQUENCY, URINARY 07/07/2007  . GERD 06/06/2007  . HYPOTHYROIDISM 06/06/2007  . INTERSTITIAL CYSTITIS 09/11/2010  . NEPHROLITHIASIS, HX OF 11/05/2010  . OSTEOPENIA 10/14/2009  . OVERACTIVE BLADDER 10/14/2009  . PAIN, CHRONIC NEC 06/06/2007  . POLYARTHRITIS 08/19/2007  . 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 WEIGHT GAIN 02/22/2009  . Diabetes mellitus without complication ([place])   . Lupus ([place]) 2006  . Cervical cancer ([place])   . Lupus [facility])     Patient Active Problem List   Diagnosis Date Noted  . 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 Abdominal pain, epigastric 09/08/2013  . Type II or unspecified type diabetes mellitus without mention of complication, uncontrolled 03/15/2013  . UTI (urinary tract infection) 02/15/2013  . Chronic pain 03/31/2011  . NEPHROLITHIASIS, HX OF 11/05/2010  . INTERSTITIAL CYSTITIS 09/11/2010  .  OVERACTIVE BLADDER 10/14/2009  . OSTEOPENIA 10/14/2009  . BACK PAIN 02/22/2009  . WEIGHT GAIN 02/22/2009  . 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 POLYARTHRITIS 08/19/2007  . FREQUENCY, URINARY 07/07/2007  . HYPOTHYROIDISM 06/06/2007  . ANXIETY 06/06/2007  . DEPRESSION 06/06/2007  . PAIN, CHRONIC NEC 06/06/2007  . GERD 06/06/2007    Past Surgical History  Procedure Laterality Date  . Appendectomy    . Tonsillectomy    . Wrist reconstruction    . Revision total hip arthroplasty  06-2009  . Breast surgery      Current Outpatient Rx  Name  Route  Sig  Dispense  Refill  . ABILIFY 15 MG tablet   Oral   Take 15 mg by mouth daily.          Marland Kitchen albuterol (PROVENTIL HFA) 108 (90 BASE) MCG/ACT inhaler   Inhalation   Inhale 2 puffs into the lungs every 4 (four) hours as needed for wheezing or shortness of breath.          . Ascorbic Acid (VITAMIN C GUMMIE PO)   Oral   Take 4 tablets by mouth every morning.         Marland Kitchen aspirin 81 MG tablet   Oral   Take 81 mg by mouth daily.         Marland Kitchen azaTHIOprine (IMURAN) 50 MG tablet   Oral   Take 100 mg by mouth daily.         . Calcium-Phosphorus-Vitamin D (CALCIUM GUMMIES PO)   Oral   Take 2 tablets by mouth every morning.         Marland Kitchen  celecoxib (CELEBREX) 200 MG capsule               . Cholecalciferol (VITAMIN D) 2000 UNITS CAPS   Oral   Take 2,000 Units by mouth every morning.          . dapsone 25 MG tablet   Oral   Take 25 mg by mouth every morning.          . diclofenac sodium (VOLTAREN) 1 % GEL   Topical   Apply 2 g topically 4 (four) times daily.         . DULoxetine (CYMBALTA) 60 MG capsule   Oral   Take 60 mg by mouth 2 (two) times daily.         . fentaNYL (DURAGESIC - DOSED MCG/HR) 75 MCG/HR   Transdermal   Place 75 mcg onto the skin every 3 (three) days.         . folic acid (FOLVITE) 1 MG tablet   Oral   Take 1 mg by mouth daily.         Marland Kitchen levothyroxine (SYNTHROID, LEVOTHROID) 25  MCG tablet   Oral   Take 1 tablet (25 mcg total) by mouth daily before breakfast.   90 tablet   1   . LORazepam (ATIVAN) 1 MG tablet   Oral   Take 1-2 mg by mouth 3 (three) times daily. 1mg  am and afternoon and 2 mg at bedtime         . metFORMIN (GLUCOPHAGE-XR) 500 MG 24 hr tablet   Oral   Take 1 tablet (500 mg total) by mouth 2 (two) times daily.   180 tablet   3   . methotrexate 25 MG/ML injection   Subcutaneous   Inject 25 mg into the skin once a week. On saturdays         . methylphenidate (CONCERTA) 27 MG CR tablet   Oral   Take 27 mg by mouth every morning.         . Multiple Vitamins-Minerals (HM MULTIVITAMIN ADULT GUMMY PO)   Oral   Take 2 tablets by mouth daily.         . ondansetron (ZOFRAN-ODT) 4 MG disintegrating tablet               . oxyCODONE-acetaminophen (PERCOCET) 10-325 MG per tablet   Oral   Take 1 tablet by mouth every 6 (six) hours as needed for pain.          . pantoprazole (PROTONIX) 40 MG tablet      Take one tablet by mouth twice daily   180 tablet   3   . predniSONE (DELTASONE) 10 MG tablet   Oral   Take 10 mg by mouth daily.          . pregabalin (LYRICA) 50 MG capsule   Oral   Take 1 capsule (50 mg total) by mouth at bedtime.   30 capsule   11   . Pyridoxal-5 Phosphate POWD   Oral   Take by mouth.         . topiramate (TOPAMAX) 100 MG tablet      Take one tablet by mouth twice daily   180 tablet   1   . traMADol (ULTRAM) 50 MG tablet   Oral   Take 1 tablet (50 mg total) by mouth every 8 (eight) hours as needed.   60 tablet   0   . triamcinolone cream (KENALOG) 0.1 %  Topical   Apply 1 application topically 2 (two) times daily.   85.2 g   1   . valACYclovir (VALTREX) 500 MG tablet   Oral   Take 500 mg by mouth at bedtime.            Allergies Clarithromycin; Compazine; Dhea; Fish allergy; Latex; Penicillins; Prasterone; Promethazine hcl; and Hydromorphone hcl  Family History  Problem  Relation Age of Onset  . Hypertension Mother   . Arthritis Mother   . Asthma Sister   . Asthma Daughter   . Diabetes Maternal Grandmother   . Heart disease Maternal Grandmother   . Colon cancer Maternal Uncle 84  . Pancreatic cancer Father   . Crohn's disease Maternal Aunt   . Diabetes Maternal Aunt     Social History Social History  Substance Use Topics  . Smoking status: Never Smoker   . Smokeless tobacco: Never Used  . Alcohol Use: No    Review of Systems Constitutional: Negative for fever Cardiovascular: As it for right lower chest pain Respiratory: Negative for shortness of breath. Gastrointestinal: Negative for abdominal pain Genitourinary: Negative for dysuria. Musculoskeletal: States pain radiates to her right back. 10-point ROS otherwise negative.  ____________________________________________   PHYSICAL EXAM:  VITAL SIGNS: ED Triage Vitals  Enc Vitals Group     BP 09/16/15 2000 143/82 mmHg     Pulse Rate 09/16/15 2000 103     Resp 09/16/15 2000 16     Temp 09/16/15 2000 98.5 F (36.9 C)     Temp Source 09/16/15 2000 Oral     SpO2 --      Weight 09/16/15 2000 185 lb (83.915 kg)     Height 09/16/15 2000 5\' 10"  (1.778 m)     Head Cir --      Peak Flow --      Pain Score 09/16/15 2002 8     Pain Loc --      Pain Edu? --      Excl. in [place]? --     Constitutional: Alert and oriented. Well appearing and in no distress. Eyes: Normal exam ENT   Head: Normocephalic and atraumatic.   Mouth/Throat: Mucous membranes are moist. Cardiovascular: Normal rate, regular rhythm.  Respiratory: Normal respiratory effort without tachypnea nor retractions. Breath sounds are clear and equal bilaterally. No wheezes/rales/rhonchi. Gastrointestinal: Soft, moderate right upper quadrant tenderness palpation below the costal margin. No rebound or guarding. Musculoskeletal: Nontender with normal range of motion in all extremities. No lower extremity edema or  tenderness. Neurologic:  Normal speech and language. No gross focal neurologic deficits  Psychiatric: Mood and affect are normal. Speech and behavior are normal. Patient exhibits appropriate insight and judgment.  ____________________________________________    EKG  EKG reviewed and interpreted by myself shows normal sinus rhythm at 91 bpm, narrow QRS, normal axis, normal intervals, nonspecific ST changes are present. No ST elevations noted.  ____________________________________________    RADIOLOGY  No acute abnormality on chest x-ray   INITIAL IMPRESSION / ASSESSMENT AND PLAN / ED COURSE  Pertinent labs & imaging results that were available during my care of the patient were reviewed by me and considered in my medical decision making (see chart for details).  She presents with right lower chest pain 2 weeks. Patient states it was intermittent however over the past 1 week and has been constant. Denies any known modifying factors. On exam the patient is much more tender in the right upper quadrant below the costal margin. Patient had  a cholecystectomy 3 years ago, we'll obtain an ultrasound, an add-on labs looking at the patient's liver, lipase, and a d-dimer given the somewhat pleuritic nature of her pain.  Labs are largely within normal limits (d-dimer pending). Ultrasound within normal limits. We will await d-dimer.  D-dimer negative. We'll discharge from a short course of pain medication and primary care follow-up.  ____________________________________________   FINAL CLINICAL IMPRESSION(S) / ED DIAGNOSES  Right upper quadrant pain Right chest pain   Harvest Dark, MD 09/16/15 2353

## 2015-09-16 NOTE — ED Notes (Signed)
Patient has history of Lupus and c/o right sided chest pain for one week.  Pain worsens with deep breath or cough.  Describes pain as sharp.

## 2015-11-11 ENCOUNTER — Ambulatory Visit: Payer: Medicare Other | Admitting: Cardiovascular Disease

## 2015-11-13 ENCOUNTER — Emergency Department
Admission: EM | Admit: 2015-11-13 | Discharge: 2015-11-13 | Disposition: A | Discharge status: Home / Self Care | Payer: Medicare Other | Attending: Emergency Medicine | Admitting: Emergency Medicine

## 2015-11-13 ENCOUNTER — Emergency Department: Payer: Medicare Other

## 2015-11-13 DIAGNOSIS — M130 Polyarthritis, unspecified: Secondary | ICD-10-CM | POA: Insufficient documentation

## 2015-11-13 DIAGNOSIS — R1310 Dysphagia, unspecified: Secondary | ICD-10-CM | POA: Insufficient documentation

## 2015-11-13 DIAGNOSIS — F419 Anxiety disorder, unspecified: Secondary | ICD-10-CM | POA: Diagnosis not present

## 2015-11-13 DIAGNOSIS — E119 Type 2 diabetes mellitus without complications: Secondary | ICD-10-CM | POA: Insufficient documentation

## 2015-11-13 DIAGNOSIS — Z7952 Long term (current) use of systemic steroids: Secondary | ICD-10-CM | POA: Diagnosis not present

## 2015-11-13 DIAGNOSIS — Z792 Long term (current) use of antibiotics: Secondary | ICD-10-CM | POA: Diagnosis not present

## 2015-11-13 DIAGNOSIS — Z880 Allergy status to penicillin: Secondary | ICD-10-CM | POA: Diagnosis not present

## 2015-11-13 DIAGNOSIS — Z91040 Latex allergy status: Secondary | ICD-10-CM | POA: Insufficient documentation

## 2015-11-13 DIAGNOSIS — Z7984 Long term (current) use of oral hypoglycemic drugs: Secondary | ICD-10-CM | POA: Diagnosis not present

## 2015-11-13 DIAGNOSIS — F329 Major depressive disorder, single episode, unspecified: Secondary | ICD-10-CM | POA: Insufficient documentation

## 2015-11-13 DIAGNOSIS — Z79891 Long term (current) use of opiate analgesic: Secondary | ICD-10-CM | POA: Diagnosis not present

## 2015-11-13 DIAGNOSIS — Z7982 Long term (current) use of aspirin: Secondary | ICD-10-CM | POA: Diagnosis not present

## 2015-11-13 DIAGNOSIS — J029 Acute pharyngitis, unspecified: Secondary | ICD-10-CM | POA: Diagnosis present

## 2015-11-13 DIAGNOSIS — M329 Systemic lupus erythematosus, unspecified: Secondary | ICD-10-CM | POA: Diagnosis not present

## 2015-11-13 DIAGNOSIS — Z79899 Other long term (current) drug therapy: Secondary | ICD-10-CM | POA: Insufficient documentation

## 2015-11-13 DIAGNOSIS — I10 Essential (primary) hypertension: Secondary | ICD-10-CM | POA: Diagnosis not present

## 2015-11-13 LAB — POCT RAPID STREP A: Streptococcus, Group A Screen (Direct): NEGATIVE

## 2015-11-13 MED ORDER — DIPHENHYDRAMINE HCL 12.5 MG/5ML PO SYRP: 25.0000 mg | ORAL_SOLUTION | Freq: Four times a day (QID) | ORAL | Status: DC | PRN

## 2015-11-13 MED ORDER — LIDOCAINE VISCOUS 2 % MT SOLN: 5.0000 mL | Freq: Four times a day (QID) | OROMUCOSAL | Status: DC | PRN

## 2015-11-13 NOTE — ED Notes (Signed)
Pt c/o sore throat with redness that started today.Marland Kitchen

## 2015-11-13 NOTE — ED Provider Notes (Signed)
[facility] Emergency Department Provider Note  ____________________________________________  Time seen: Approximately 9:24 AM  I have reviewed the triage vital signs and the nursing notes.   HISTORY  Chief Complaint Sore Throat    HPI [name] [name] is a 49 y.o. female patient complaining of sore throat external redness to the anterior neck and dysphasia. All these complaints started this morning upon awakening. Patient denies any fevers chills associated with this complaint. Patient states she's been exposed to pneumonia. Patient states she is able to tolerate fluids. Patient is concerned because her immune system is compromised secondary to lupus . No palliative measures taken for this complaint patient rates her discomfort as a 6/10.  Past Medical History  Diagnosis Date  . ANXIETY 06/06/2007  . BACK PAIN 02/22/2009  . DEPRESSION 06/06/2007  . FREQUENCY, URINARY 07/07/2007  . GERD 06/06/2007  . HYPOTHYROIDISM 06/06/2007  . INTERSTITIAL CYSTITIS 09/11/2010  . NEPHROLITHIASIS, HX OF 11/05/2010  . OSTEOPENIA 10/14/2009  . OVERACTIVE BLADDER 10/14/2009  . PAIN, CHRONIC NEC 06/06/2007  . POLYARTHRITIS 08/19/2007  . 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 WEIGHT GAIN 02/22/2009  . Diabetes mellitus without complication ([place])   . Lupus ([place]) 2006  . Cervical cancer ([place])   . Lupus [facility])     Patient Active Problem List   Diagnosis Date Noted  . Sicca syndrome ([place]) 06/04/2014  . Abdominal pain, epigastric 09/08/2013  . Type II or unspecified type diabetes mellitus without mention of complication, uncontrolled 03/15/2013  . UTI (urinary tract infection) 02/15/2013  . Chronic pain 03/31/2011  . NEPHROLITHIASIS, HX OF 11/05/2010  . INTERSTITIAL CYSTITIS 09/11/2010  . OVERACTIVE BLADDER 10/14/2009  . OSTEOPENIA 10/14/2009  . BACK PAIN 02/22/2009  . WEIGHT GAIN 02/22/2009  . 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 POLYARTHRITIS 08/19/2007  . FREQUENCY,  URINARY 07/07/2007  . HYPOTHYROIDISM 06/06/2007  . ANXIETY 06/06/2007  . DEPRESSION 06/06/2007  . PAIN, CHRONIC NEC 06/06/2007  . GERD 06/06/2007    Past Surgical History  Procedure Laterality Date  . Appendectomy    . Tonsillectomy    . Wrist reconstruction    . Revision total hip arthroplasty  06-2009  . Breast surgery      Current Outpatient Rx  Name  Route  Sig  Dispense  Refill  . ABILIFY 15 MG tablet   Oral   Take 15 mg by mouth daily.          Marland Kitchen albuterol (PROVENTIL HFA) 108 (90 BASE) MCG/ACT inhaler   Inhalation   Inhale 2 puffs into the lungs every 4 (four) hours as needed for wheezing or shortness of breath.          . Ascorbic Acid (VITAMIN C GUMMIE PO)   Oral   Take 4 tablets by mouth every morning.         Marland Kitchen aspirin 81 MG tablet   Oral   Take 81 mg by mouth daily.         Marland Kitchen azaTHIOprine (IMURAN) 50 MG tablet   Oral   Take 100 mg by mouth daily.         . Calcium-Phosphorus-Vitamin D (CALCIUM GUMMIES PO)   Oral   Take 2 tablets by mouth every morning.         . celecoxib (CELEBREX) 200 MG capsule               . Cholecalciferol (VITAMIN D) 2000 UNITS CAPS   Oral   Take 2,000 Units by mouth every  morning.          . dapsone 25 MG tablet   Oral   Take 25 mg by mouth every morning.          . diclofenac sodium (VOLTAREN) 1 % GEL   Topical   Apply 2 g topically 4 (four) times daily.         . DULoxetine (CYMBALTA) 60 MG capsule   Oral   Take 60 mg by mouth 2 (two) times daily.         . fentaNYL (DURAGESIC - DOSED MCG/HR) 75 MCG/HR   Transdermal   Place 75 mcg onto the skin every 3 (three) days.         . folic acid (FOLVITE) 1 MG tablet   Oral   Take 1 mg by mouth daily.         Marland Kitchen HYDROcodone-acetaminophen (NORCO/VICODIN) 5-325 MG tablet   Oral   Take 1 tablet by mouth every 4 (four) hours as needed for moderate pain.   10 tablet   0   . levothyroxine (SYNTHROID, LEVOTHROID) 25 MCG tablet   Oral   Take 1  tablet (25 mcg total) by mouth daily before breakfast.   90 tablet   1   . LORazepam (ATIVAN) 1 MG tablet   Oral   Take 1-2 mg by mouth 3 (three) times daily. 1mg  am and afternoon and 2 mg at bedtime         . metFORMIN (GLUCOPHAGE-XR) 500 MG 24 hr tablet   Oral   Take 1 tablet (500 mg total) by mouth 2 (two) times daily.   180 tablet   3   . methotrexate 25 MG/ML injection   Subcutaneous   Inject 25 mg into the skin once a week. On saturdays         . methylphenidate (CONCERTA) 27 MG CR tablet   Oral   Take 27 mg by mouth every morning.         . Multiple Vitamins-Minerals (HM MULTIVITAMIN ADULT GUMMY PO)   Oral   Take 2 tablets by mouth daily.         . ondansetron (ZOFRAN-ODT) 4 MG disintegrating tablet               . oxyCODONE-acetaminophen (PERCOCET) 10-325 MG per tablet   Oral   Take 1 tablet by mouth every 6 (six) hours as needed for pain.          . pantoprazole (PROTONIX) 40 MG tablet      Take one tablet by mouth twice daily   180 tablet   3   . predniSONE (DELTASONE) 10 MG tablet   Oral   Take 10 mg by mouth daily.          . pregabalin (LYRICA) 50 MG capsule   Oral   Take 1 capsule (50 mg total) by mouth at bedtime.   30 capsule   11   . Pyridoxal-5 Phosphate POWD   Oral   Take by mouth.         . topiramate (TOPAMAX) 100 MG tablet      Take one tablet by mouth twice daily   180 tablet   1   . traMADol (ULTRAM) 50 MG tablet   Oral   Take 1 tablet (50 mg total) by mouth every 8 (eight) hours as needed.   60 tablet   0   . triamcinolone cream (KENALOG) 0.1 %   Topical  Apply 1 application topically 2 (two) times daily.   85.2 g   1   . valACYclovir (VALTREX) 500 MG tablet   Oral   Take 500 mg by mouth at bedtime.            Allergies Clarithromycin; Compazine; Dhea; Fish allergy; Latex; Penicillins; Prasterone; Promethazine hcl; and Hydromorphone hcl  Family History  Problem Relation Age of Onset  .  Hypertension Mother   . Arthritis Mother   . Asthma Sister   . Asthma Daughter   . Diabetes Maternal Grandmother   . Heart disease Maternal Grandmother   . Colon cancer Maternal Uncle 44  . Pancreatic cancer Father   . Crohn's disease Maternal Aunt   . Diabetes Maternal Aunt     Social History Social History  Substance Use Topics  . Smoking status: Never Smoker   . Smokeless tobacco: Never Used  . Alcohol Use: No    Review of Systems Constitutional: No fever/chills Eyes: No visual changes. ENT: No sore throat. Cardiovascular: Denies chest pain. Respiratory: Denies shortness of breath. Gastrointestinal: No abdominal pain.  No nausea, no vomiting.  No diarrhea.  No constipation. Genitourinary: Negative for dysuria. Musculoskeletal: Negative for back pain. Skin: Negative for rash. Neurological: Negative for headaches, focal weakness or numbness. Psychiatric:Anxiety and depression  Endocrine:Hypertension and diabetes  Hematological/Lymphatic: Allergic/Immunilogical: Lupus and polyarthritis  10-point ROS otherwise negative.  ____________________________________________   PHYSICAL EXAM:  VITAL SIGNS: ED Triage Vitals  Enc Vitals Group     BP 11/13/15 0858 156/49 mmHg     Pulse Rate 11/13/15 0858 91     Resp 11/13/15 0858 16     Temp 11/13/15 0858 98.3 F (36.8 C)     Temp Source 11/13/15 0858 Oral     SpO2 11/13/15 0858 97 %     Weight 11/13/15 0858 200 lb (90.719 kg)     Height 11/13/15 0858 5\' 10"  (1.778 m)     Head Cir --      Peak Flow --      Pain Score 11/13/15 0858 6     Pain Loc --      Pain Edu? --      Excl. in [place]? --     Constitutional: Alert and oriented. Well appearing and in no acute distress. Eyes: Conjunctivae are normal. PERRL. EOMI. Head: Atraumatic. Nose: No congestion/rhinnorhea. Mouth/Throat: Mucous membranes are moist.  Oropharynx non-erythematous. Neck: No stridor.  No cervical spine tenderness to  palpation. Hematological/Lymphatic/Immunilogical: No cervical lymphadenopathy. Cardiovascular: Normal rate, regular rhythm. Grossly normal heart sounds.  Good peripheral circulation. Respiratory: Normal respiratory effort.  No retractions. Lungs CTAB. Gastrointestinal: Soft and nontender. No distention. No abdominal bruits. No CVA tenderness. Musculoskeletal: No lower extremity tenderness nor edema.  No joint effusions. Neurologic:  Normal speech and language. No gross focal neurologic deficits are appreciated. No gait instability.Patient has resting tremors. Skin:  Skin is warm, dry and intact. No rash noted. Psychiatric: Mood and affect are normal. Speech and behavior are normal.  ____________________________________________   LABS (all labs ordered are listed, but only abnormal results are displayed)  Labs Reviewed  POCT RAPID STREP A   ____________________________________________  EKG   ____________________________________________  RADIOLOGY  Soft tissue neck x-ray unremarkable. I, Sable Feil, personally viewed and evaluated these images (plain radiographs) as part of my medical decision making.   ____________________________________________   PROCEDURES  Procedure(s) performed: None  Critical Care performed: No  ____________________________________________   INITIAL IMPRESSION / ASSESSMENT AND PLAN /  ED COURSE  Pertinent labs & imaging results that were available during my care of the patient were reviewed by me and considered in my medical decision making (see chart for details). Dysphagia. Discussed negative x-ray with patient. Advised patient strep test was negative culture is pending. Discussed home care and advised follow-up with ENT doctor if condition persists or worsens. Patient given a prescription for viscous lidocaine and Benadryl to use as a swish and swallow. ____________________________________________   FINAL CLINICAL IMPRESSION(S) / ED  DIAGNOSES  Final diagnoses:  Dysphagia      Sable Feil, PA-C 11/13/15 1015  Earleen Newport, MD 11/13/15 1329

## 2015-11-13 NOTE — Discharge Instructions (Signed)

## 2015-12-14 ENCOUNTER — Emergency Department
Admission: EM | Admit: 2015-12-14 | Discharge: 2015-12-14 | Disposition: A | Discharge status: Home / Self Care | Payer: Medicare Other | Attending: Emergency Medicine | Admitting: Emergency Medicine

## 2015-12-14 DIAGNOSIS — Z7984 Long term (current) use of oral hypoglycemic drugs: Secondary | ICD-10-CM | POA: Diagnosis not present

## 2015-12-14 DIAGNOSIS — R519 Headache, unspecified: Secondary | ICD-10-CM

## 2015-12-14 DIAGNOSIS — E119 Type 2 diabetes mellitus without complications: Secondary | ICD-10-CM | POA: Insufficient documentation

## 2015-12-14 DIAGNOSIS — R51 Headache: Secondary | ICD-10-CM | POA: Insufficient documentation

## 2015-12-14 DIAGNOSIS — Z791 Long term (current) use of non-steroidal anti-inflammatories (NSAID): Secondary | ICD-10-CM | POA: Insufficient documentation

## 2015-12-14 DIAGNOSIS — M79641 Pain in right hand: Secondary | ICD-10-CM | POA: Diagnosis not present

## 2015-12-14 DIAGNOSIS — Z79899 Other long term (current) drug therapy: Secondary | ICD-10-CM | POA: Insufficient documentation

## 2015-12-14 DIAGNOSIS — Z7952 Long term (current) use of systemic steroids: Secondary | ICD-10-CM | POA: Insufficient documentation

## 2015-12-14 DIAGNOSIS — M79644 Pain in right finger(s): Secondary | ICD-10-CM | POA: Insufficient documentation

## 2015-12-14 DIAGNOSIS — Z91040 Latex allergy status: Secondary | ICD-10-CM | POA: Insufficient documentation

## 2015-12-14 DIAGNOSIS — M25541 Pain in joints of right hand: Secondary | ICD-10-CM

## 2015-12-14 DIAGNOSIS — M25561 Pain in right knee: Secondary | ICD-10-CM

## 2015-12-14 DIAGNOSIS — Z79891 Long term (current) use of opiate analgesic: Secondary | ICD-10-CM | POA: Insufficient documentation

## 2015-12-14 DIAGNOSIS — Z7982 Long term (current) use of aspirin: Secondary | ICD-10-CM | POA: Insufficient documentation

## 2015-12-14 DIAGNOSIS — Z880 Allergy status to penicillin: Secondary | ICD-10-CM | POA: Insufficient documentation

## 2015-12-14 LAB — COMPREHENSIVE METABOLIC PANEL
ALBUMIN: 3.7 g/dL (ref 3.5–5.0)
ALT: 24 U/L (ref 14–54)
AST: 23 U/L (ref 15–41)
Alkaline Phosphatase: 65 U/L (ref 38–126)
Anion gap: 6 (ref 5–15)
BUN: 25 mg/dL — AB (ref 6–20)
CHLORIDE: 110 mmol/L (ref 101–111)
CO2: 23 mmol/L (ref 22–32)
Calcium: 8.4 mg/dL — ABNORMAL LOW (ref 8.9–10.3)
Creatinine, Ser: 0.85 mg/dL (ref 0.44–1.00)
GFR calc Af Amer: >60 mL/min (ref >60)
Glucose, Bld: 102 mg/dL — ABNORMAL HIGH (ref 65–99)
POTASSIUM: 4.4 mmol/L (ref 3.5–5.1)
SODIUM: 139 mmol/L (ref 135–145)
Total Bilirubin: 0.6 mg/dL (ref 0.3–1.2)
Total Protein: 6.1 g/dL — ABNORMAL LOW (ref 6.5–8.1)

## 2015-12-14 LAB — TROPONIN I

## 2015-12-14 LAB — CBC
HCT: 40.9 % (ref 35.0–47.0)
Hemoglobin: 13.3 g/dL (ref 12.0–16.0)
MCH: 28.8 pg (ref 26.0–34.0)
MCHC: 32.6 g/dL (ref 32.0–36.0)
MCV: 88.3 fL (ref 80.0–100.0)
PLATELETS: 152 10*3/uL (ref 150–440)
RBC: 4.63 MIL/uL (ref 3.80–5.20)
RDW: 15.9 % — AB (ref 11.5–14.5)
WBC: 8.6 10*3/uL (ref 3.6–11.0)

## 2015-12-14 MED ORDER — MORPHINE SULFATE (PF) 4 MG/ML IV SOLN
4.0000 mg | Freq: Once | INTRAVENOUS | Status: AC
  Administered 2015-12-14: 4 mg via INTRAVENOUS
  Filled 2015-12-14: qty 1

## 2015-12-14 MED ORDER — SODIUM CHLORIDE 0.9 % IV BOLUS (SEPSIS)
1000.0000 mL | Freq: Once | INTRAVENOUS | Status: AC
Start: 2015-12-14 — End: 2015-12-14
  Administered 2015-12-14: 1000 mL via INTRAVENOUS

## 2015-12-14 MED ORDER — MORPHINE SULFATE (PF) 4 MG/ML IV SOLN
INTRAVENOUS | Status: AC
Start: 2015-12-14 — End: 2015-12-14
  Administered 2015-12-14: 4 mg via INTRAVENOUS
  Filled 2015-12-14: qty 1

## 2015-12-14 MED ORDER — MORPHINE SULFATE (PF) 4 MG/ML IV SOLN
4.0000 mg | Freq: Once | INTRAVENOUS | Status: AC
  Administered 2015-12-14: 4 mg via INTRAVENOUS

## 2015-12-14 MED ORDER — ONDANSETRON HCL 4 MG/2ML IJ SOLN
4.0000 mg | Freq: Once | INTRAMUSCULAR | Status: AC
  Administered 2015-12-14: 4 mg via INTRAVENOUS
  Filled 2015-12-14: qty 2

## 2015-12-14 NOTE — ED Notes (Signed)
Pt has hx of lupus. Pt c/o 10 out of 10 pian in head, rt elbow/wrist/hip/knee. Pt c/o lightheadedness, nausea. Pt denies vomiting, chest pain, and SOB.

## 2015-12-14 NOTE — Discharge Instructions (Signed)
You were evaluated in the emergency department for multiple joint pain as well as a nonspecific headache. Although no certain cause was found, your exam and evaluation are reassuring today in the emergency department. You were treated with pain medication for likely acute exacerbation of chronic pain from your lupus.  We discussed, return to the emergency department for any fever, skin rash, joint swelling, new weakness or numbness, any confusion or altered mental status, vision problems, slurred speech or trouble finding your words, as these could be symptoms of more serious or emergency condition.  Follow up with your primary care doctor within one week. Also contact your rheumatologist for next available appointment.   Joint Pain Joint pain, which is also called arthralgia, can be caused by many things. Joint pain often goes away when you follow your health care provider's instructions for relieving pain at home. However, joint pain can also be caused by conditions that require further treatment. Common causes of joint pain include:  Bruising in the area of the joint.  Overuse of the joint.  Wear and tear on the joints that occur with aging (osteoarthritis).  Various other forms of arthritis.  A buildup of a crystal form of uric acid in the joint (gout).  Infections of the joint (septic arthritis) or of the bone (osteomyelitis). Your health care provider may recommend medicine to help with the pain. If your joint pain continues, additional tests may be needed to diagnose your condition. HOME CARE INSTRUCTIONS Watch your condition for any changes. Follow these instructions as directed to lessen the pain that you are feeling.  Take medicines only as directed by your health care provider.  Rest the affected area for as long as your health care provider says that you should. If directed to do so, raise the painful joint above the level of your heart while you are sitting or lying  down.  Do not do things that cause or worsen pain.  If directed, apply ice to the painful area:  Put ice in a plastic bag.  Place a towel between your skin and the bag.  Leave the ice on for 20 minutes, 2-3 times per day.  Wear an elastic bandage, splint, or sling as directed by your health care provider. Loosen the elastic bandage or splint if your fingers or toes become numb and tingle, or if they turn cold and blue.  Begin exercising or stretching the affected area as directed by your health care provider. Ask your health care provider what types of exercise are safe for you.  Keep all follow-up visits as directed by your health care provider. This is important. SEEK MEDICAL CARE IF:  Your pain increases, and medicine does not help.  Your joint pain does not improve within 3 days.  You have increased bruising or swelling.  You have a fever.  You lose 10 lb (4.5 kg) or more without trying. SEEK IMMEDIATE MEDICAL CARE IF:  You are not able to move the joint.  Your fingers or toes become numb or they turn cold and blue.   This information is not intended to replace advice given to you by your health care provider. Make sure you discuss any questions you have with your health care provider.   Document Released: 11/16/2005 Document Revised: 12/07/2014 Document Reviewed: 08/28/2014 Elsevier Interactive Patient Education 2016 [employer]  Systemic Lupus Erythematosus, Adult Systemic lupus erythematosus is a long-term (chronic) disease that can affect many parts of the body. It can damage the skin,  joints, blood vessels, brain, kidneys, lungs, heart, and other internal organs. It causes pain, irritation, and inflammation. Systemic lupus erythematosus is an autoimmune disease. With this type of disease, the body's defense system (immune system) mistakenly attacks normal tissues instead of attacking germs or abnormal growths. CAUSES The cause of this condition is not  known. RISK FACTORS This condition is more likely to develop in:  Females.  People of Asian descent.  People of African-American descent.  People who have a family history of the condition. SYMPTOMS General symptoms include:  Joint pain and swelling (common).  Fever.  Fatigue.  Unusual weight loss or weight gain.  Skin rashes, especially over the nose and cheeks (butterfly rash) and after sun exposure.  Sores inside the mouth or nose. Other symptoms depend on which parts of the body are affected. They can include:  Shortness of breath.  Chest pain.  Frequent urination.  Blood in the urine.  Seizures.  Mental changes.  Hair loss.  Swollen and tender lymph nodes.  Swelling of the hands or feet. Symptoms can come and go. A period of time when symptoms get worse or come back is called a flare. A period of time with no symptoms is called a remission. DIAGNOSIS This condition is diagnosed based on symptoms, a medical history, and a physical exam. You may also have tests, including:  Blood tests.  Urine tests.  A chest X-ray.  A skin or kidney biopsy. For this test, a sample of tissue is taken from the skin or kidney and studied under a microscope. You may be referred to an autoimmune disease specialist (rheumatologist). TREATMENT There is no cure for this condition, but treatment can keep the disease in remission, help to control symptoms, and prevent damage to the heart, lungs, kidneys, and other organs. Treatment may involve taking a combination of medicines over time. HOME CARE INSTRUCTIONS Medicines  Take medicines only as directed by your health care provider.  Do not take any medicines that contain estrogen without first checking with your health care provider. Estrogen can trigger flares and may increase your risk for blood clots. Lifestyle  Eat a heart-healthy diet.  Stay active as directed by your health care provider.  Do not smoke. If you  need help quitting, ask your health care provider.  Protect your skin from the sun by applying sunblock and wearing protective hats and clothing.  Learn as much as you can about your condition and have a good support system in place. Support may come from family, friends, or a lupus support group. General Instructions  Keep all follow-up visits as directed by your health care provider. This is important.  Work closely with all of your health care providers to manage your condition.  Let your health care provider know right away if you become pregnant or if you plan to become pregnant. Pregnancy in women with this condition is considered high risk. SEEK MEDICAL CARE IF:  You have a fever.  Your symptoms flare.  You develop new symptoms.  You develop swollen feet or hands.  You develop puffiness around your eyes.  Your medicines are not working.  You have bloody, foamy, or coffee-colored urine.  There are changes in your urination. For example, you urinate more often at night.  You think that you may be depressed or have anxiety. SEEK IMMEDIATE MEDICAL CARE IF:  You have chest pain.  You have trouble breathing.  You have a seizure.  You suddenly get a very bad headache.  You suddenly develop facial or body weakness.  You cannot speak.  You cannot understand speech.   This information is not intended to replace advice given to you by your health care provider. Make sure you discuss any questions you have with your health care provider.   Document Released: 11/06/2002 Document Revised: 04/02/2015 Document Reviewed: 10/24/2014 Elsevier Interactive Patient Education 2016 Elsevier Inc.    Pain Without a Known Cause WHAT IS PAIN WITHOUT A KNOWN CAUSE? Pain can occur in any part of the body and can range from mild to severe. Sometimes no cause can be found for why you are having pain. Some types of pain that can occur without a known cause include:    Headache.  Back pain.  Abdominal pain.  Neck pain. HOW IS PAIN WITHOUT A KNOWN CAUSE DIAGNOSED?  Your health care provider will try to find the cause of your pain. This may include:  Physical exam.  Medical history.  Blood tests.  Urine tests.  X-rays. If no cause is found, your health care provider may diagnose you with pain without a known cause.  IS THERE TREATMENT FOR PAIN WITHOUT A CAUSE?  Treatment depends on the kind of pain you have. Your health care provider may prescribe medicines to help relieve your pain.  WHAT CAN I DO AT HOME FOR MY PAIN?   Take medicines only as directed by your health care provider.  Stop any activities that cause pain. During periods of severe pain, bed rest may help.  Try to reduce your stress with activities such as yoga or meditation. Talk to your health care provider for other stress-reducing activity recommendations.  Exercise regularly, if approved by your health care provider.  Eat a healthy diet that includes fruits and vegetables. This may improve pain. Talk to your health care provider if you have any questions about your diet. WHAT IF MY PAIN DOES NOT GET BETTER?  If you have a painful condition and no reason can be found for the pain or the pain gets worse, it is important to follow up with your health care provider. It may be necessary to repeat tests and look further for a possible cause.    This information is not intended to replace advice given to you by your health care provider. Make sure you discuss any questions you have with your health care provider.   Document Released: 08/11/2001 Document Revised: 12/07/2014 Document Reviewed: 04/03/2014 Elsevier Interactive Patient Education [employer].

## 2015-12-14 NOTE — ED Notes (Signed)
Pt to triage via wheelchair. Pt reports hx of lupus. Pt reports she was feeling ok when she went to bed but woke up around 4am with pain to her joints especially to her right wrist and hand, right knee and right hip. Pt also reports she has a headache. Pt is noted to be hypertensive during triage but denies hx of hypertension.

## 2015-12-14 NOTE — ED Provider Notes (Signed)
[facility] Emergency Department Provider Note   ____________________________________________  Time seen: Approximately 7 AM I have reviewed the triage vital signs and the triage nursing note.  HISTORY  Chief Complaint Joint Pain and Headache   Historian Patient  HPI [name] [name] is a 50 y.o. female,with a history of chronic pain due to lupus, who is here stating that she woke up with a "lupus flare. "  She states her lupus often affects her in terms of joint pains and this morning she woke up with joint pain in her right knee and right wrist and left wrist. She also has a moderate global headache. She states that she doesn't typically get headaches and this is a little bit unusual. No other recent illnesses such as cough congestion, vomiting or diarrhea, concern for dehydration, fever, or any new focal weakness or numbness. She states that she does have a resting tremor in 4 extremities and also chronic left numbness and tingling of her arm. These are unchanged.  She is followed by primary care physician as well as her rheumatologist in [place]. She does take Percocet as well as where a pain patch. She states her lupus pain is typically exacerbated with changes in the weather and around the time of her period.  No vision changes. No neck pain or stiffness. No skin rashes. No joint swelling.    Past Medical History  Diagnosis Date  . ANXIETY 06/06/2007  . BACK PAIN 02/22/2009  . DEPRESSION 06/06/2007  . FREQUENCY, URINARY 07/07/2007  . GERD 06/06/2007  . HYPOTHYROIDISM 06/06/2007  . INTERSTITIAL CYSTITIS 09/11/2010  . NEPHROLITHIASIS, HX OF 11/05/2010  . OSTEOPENIA 10/14/2009  . OVERACTIVE BLADDER 10/14/2009  . PAIN, CHRONIC NEC 06/06/2007  . POLYARTHRITIS 08/19/2007  . 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 WEIGHT GAIN 02/22/2009  . Diabetes mellitus without complication ([place])   . Lupus ([place]) 2006  . Cervical cancer ([place])   . Lupus [facility])     Patient  Active Problem List   Diagnosis Date Noted  . 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 Abdominal pain, epigastric 09/08/2013  . Type II or unspecified type diabetes mellitus without mention of complication, uncontrolled 03/15/2013  . UTI (urinary tract infection) 02/15/2013  . Chronic pain 03/31/2011  . NEPHROLITHIASIS, HX OF 11/05/2010  . INTERSTITIAL CYSTITIS 09/11/2010  . OVERACTIVE BLADDER 10/14/2009  . OSTEOPENIA 10/14/2009  . BACK PAIN 02/22/2009  . WEIGHT GAIN 02/22/2009  . 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 POLYARTHRITIS 08/19/2007  . FREQUENCY, URINARY 07/07/2007  . HYPOTHYROIDISM 06/06/2007  . ANXIETY 06/06/2007  . DEPRESSION 06/06/2007  . PAIN, CHRONIC NEC 06/06/2007  . GERD 06/06/2007    Past Surgical History  Procedure Laterality Date  . Appendectomy    . Tonsillectomy    . Wrist reconstruction    . Revision total hip arthroplasty  06-2009  . Breast surgery      Current Outpatient Rx  Name  Route  Sig  Dispense  Refill  . ABILIFY 15 MG tablet   Oral   Take 15 mg by mouth daily.          Marland Kitchen albuterol (PROVENTIL HFA) 108 (90 BASE) MCG/ACT inhaler   Inhalation   Inhale 2 puffs into the lungs every 4 (four) hours as needed for wheezing or shortness of breath.          . Ascorbic Acid (VITAMIN C GUMMIE PO)   Oral   Take 4 tablets by mouth every morning.         Marland Kitchen  aspirin 81 MG tablet   Oral   Take 81 mg by mouth daily.         Marland Kitchen azaTHIOprine (IMURAN) 50 MG tablet   Oral   Take 100 mg by mouth daily.         . Calcium-Phosphorus-Vitamin D (CALCIUM GUMMIES PO)   Oral   Take 2 tablets by mouth every morning.         . celecoxib (CELEBREX) 200 MG capsule               . Cholecalciferol (VITAMIN D) 2000 UNITS CAPS   Oral   Take 2,000 Units by mouth every morning.          . dapsone 25 MG tablet   Oral   Take 25 mg by mouth every morning.          . diclofenac sodium (VOLTAREN) 1 % GEL   Topical   Apply 2 g topically 4 (four)  times daily.         . diphenhydrAMINE (BENYLIN) 12.5 MG/5ML syrup   Oral   Take 10 mLs (25 mg total) by mouth 4 (four) times daily as needed for allergies. Mixed with 5 mL of viscous lidocaine for swish and swallow.   240 mL   0   . DULoxetine (CYMBALTA) 60 MG capsule   Oral   Take 60 mg by mouth 2 (two) times daily.         . fentaNYL (DURAGESIC - DOSED MCG/HR) 75 MCG/HR   Transdermal   Place 75 mcg onto the skin every 3 (three) days.         . folic acid (FOLVITE) 1 MG tablet   Oral   Take 1 mg by mouth daily.         Marland Kitchen HYDROcodone-acetaminophen (NORCO/VICODIN) 5-325 MG tablet   Oral   Take 1 tablet by mouth every 4 (four) hours as needed for moderate pain.   10 tablet   0   . levothyroxine (SYNTHROID, LEVOTHROID) 25 MCG tablet   Oral   Take 1 tablet (25 mcg total) by mouth daily before breakfast.   90 tablet   1   . lidocaine (XYLOCAINE) 2 % solution   Mouth/Throat   Use as directed 5 mLs in the mouth or throat every 6 (six) hours as needed for mouth pain. Makes with Benadryl elixir swish and swallow   100 mL   0   . LORazepam (ATIVAN) 1 MG tablet   Oral   Take 1-2 mg by mouth 3 (three) times daily. 1mg  am and afternoon and 2 mg at bedtime         . metFORMIN (GLUCOPHAGE-XR) 500 MG 24 hr tablet   Oral   Take 1 tablet (500 mg total) by mouth 2 (two) times daily.   180 tablet   3   . methotrexate 25 MG/ML injection   Subcutaneous   Inject 25 mg into the skin once a week. On saturdays         . methylphenidate (CONCERTA) 27 MG CR tablet   Oral   Take 27 mg by mouth every morning.         . Multiple Vitamins-Minerals (HM MULTIVITAMIN ADULT GUMMY PO)   Oral   Take 2 tablets by mouth daily.         . ondansetron (ZOFRAN-ODT) 4 MG disintegrating tablet               . oxyCODONE-acetaminophen (PERCOCET) 10-325  MG per tablet   Oral   Take 1 tablet by mouth every 6 (six) hours as needed for pain.          . pantoprazole (PROTONIX) 40  MG tablet      Take one tablet by mouth twice daily   180 tablet   3   . predniSONE (DELTASONE) 10 MG tablet   Oral   Take 10 mg by mouth daily.          . pregabalin (LYRICA) 50 MG capsule   Oral   Take 1 capsule (50 mg total) by mouth at bedtime.   30 capsule   11   . Pyridoxal-5 Phosphate POWD   Oral   Take by mouth.         . topiramate (TOPAMAX) 100 MG tablet      Take one tablet by mouth twice daily   180 tablet   1   . traMADol (ULTRAM) 50 MG tablet   Oral   Take 1 tablet (50 mg total) by mouth every 8 (eight) hours as needed.   60 tablet   0   . triamcinolone cream (KENALOG) 0.1 %   Topical   Apply 1 application topically 2 (two) times daily.   85.2 g   1   . valACYclovir (VALTREX) 500 MG tablet   Oral   Take 500 mg by mouth at bedtime.            Allergies Clarithromycin; Compazine; Dhea; Fish allergy; Latex; Penicillins; Prasterone; Promethazine hcl; and Hydromorphone hcl  Family History  Problem Relation Age of Onset  . Hypertension Mother   . Arthritis Mother   . Asthma Sister   . Asthma Daughter   . Diabetes Maternal Grandmother   . Heart disease Maternal Grandmother   . Colon cancer Maternal Uncle 35  . Pancreatic cancer Father   . Crohn's disease Maternal Aunt   . Diabetes Maternal Aunt     Social History Social History  Substance Use Topics  . Smoking status: Never Smoker   . Smokeless tobacco: Never Used  . Alcohol Use: No    Review of Systems  Constitutional: Negative for fever. Eyes: Negative for visual changes. ENT: Negative for sore throat. Cardiovascular: Negative for chest pain. Respiratory: Negative for shortness of breath. Gastrointestinal: Negative for abdominal pain, vomiting and diarrhea. Genitourinary: Negative for dysuria. Musculoskeletal: Negative for back pain. Skin: Negative for rash. Neurological: Positive for headache, awoke with this headache, mild to moderate and global. 10 point Review of  Systems otherwise negative ____________________________________________   PHYSICAL EXAM:  VITAL SIGNS: ED Triage Vitals  Enc Vitals Group     BP 12/14/15 0559 202/169 mmHg     Pulse Rate 12/14/15 0559 93     Resp 12/14/15 0559 18     Temp 12/14/15 0559 98.5 F (36.9 C)     Temp Source 12/14/15 0559 Oral     SpO2 12/14/15 0559 94 %     Weight 12/14/15 0559 200 lb (90.719 kg)     Height 12/14/15 0559 5\' 10"  (1.778 m)     Head Cir --      Peak Flow --      Pain Score 12/14/15 0600 10     Pain Loc --      Pain Edu? --      Excl. in [place]? --      Constitutional: Alert and oriented. Well appearing overall and in no distress. Eyes: Conjunctivae are normal. PERRL. Normal  extraocular movements. ENT   Head: Normocephalic and atraumatic.   Nose: No congestion/rhinnorhea.   Mouth/Throat: Mucous membranes are moist.   Neck: No stridor. Cardiovascular/Chest: Normal rate, regular rhythm.  No murmurs, rubs, or gallops. Respiratory: Normal respiratory effort without tachypnea nor retractions. Breath sounds are clear and equal bilaterally. No wheezes/rales/rhonchi. Gastrointestinal: Soft. No distention, no guarding, no rebound. Nontender.   Genitourinary/rectal:Deferred Musculoskeletal: Nontender with normal range of motion in all extremities. No joint effusions.  No lower extremity tenderness.  No edema. No redness to the joints. Neurologic:  Normal speech and language. No focal weakness, known, chronic left arm paresthesia. She does have a resting tremor in 4 extremities. Skin:  Skin is warm, dry and intact. No rash noted. Psychiatric: Mood and affect are normal. Speech and behavior are normal. Patient exhibits appropriate insight and judgment.  ____________________________________________   EKG I, Lisa Roca, MD, the attending physician have personally viewed and interpreted all ECGs.  None ____________________________________________  LABS (pertinent  positives/negatives)  White blood count 8.6, hemoglobin 13.3 and platelet count 152 neck sign comprehensive metabolic panel without significant abnormality Troponin less than 0.03  ____________________________________________  RADIOLOGY All Xrays were viewed by me. Imaging interpreted by Radiologist.  None __________________________________________  PROCEDURES  Procedure(s) performed: None  Critical Care performed: None  ____________________________________________   ED COURSE / ASSESSMENT AND PLAN  CONSULTATIONS: None  Pertinent labs & imaging results that were available during my care of the patient were reviewed by me and considered in my medical decision making (see chart for details).   She is mostly here for pain in her joints which is consistent with prior lupus chronic pain exacerbations. On exam she has no fever, neck stiffness, upper respiratory symptoms, urinary symptoms, joint effusions, or skin rash. For today I will go ahead and treat her with pain medication. She is already followed with a primary care physician who treats her chronic pain, as well as rheumatologist.  In terms of a headache, it sounds nonspecific, no high risk right-sided features concerning for emergency causes of headache. The initial blood pressure noted I think was erroneous, given that every blood pressure since then was in the 105/70 range.  We did has some discussion about possibly trying an elimination diet.  The patient receive symptomatic relief after 2 doses of morphine. She was requesting possible course of steroids, given prior flares had been treated with steroids. I am personally not keen to start her on steroids, unless her rheumatologist directs this plan of care. I did talk with the on-call rheumatologist at [facility], however Dr. Sarina Ill has just joined this practice and the medical records are not fully accessible to the on-call rheumatologist, and the on-call rheumatologist would  not necessarily treat with steroids for acute on chronic pain. We discussed the best plan of action for this patient would be to hold off on steroids in the emergency department and have her call the office on Monday to get in touch with her rheumatologist Dr. Sarina Ill.   Patient / Family / Caregiver informed of clinical course, medical decision-making process, and agree with plan.   I discussed return precautions, follow-up instructions, and discharged instructions with patient and/or family.  ___________________________________________   FINAL CLINICAL IMPRESSION(S) / ED DIAGNOSES   Final diagnoses:  Joint pain in fingers of right hand  Pain in joint of right knee  Acute nonintractable headache, unspecified headache type               Note: This dictation was  prepared with [employer] . Any transcriptional errors that result from this process are unintentional   Lisa Roca, MD 12/14/15 1038

## 2016-01-02 ENCOUNTER — Encounter: Payer: Self-pay | Admitting: *Deleted

## 2016-01-02 ENCOUNTER — Emergency Department: Payer: Medicare Other

## 2016-01-02 DIAGNOSIS — G8929 Other chronic pain: Secondary | ICD-10-CM | POA: Diagnosis not present

## 2016-01-02 DIAGNOSIS — J45901 Unspecified asthma with (acute) exacerbation: Secondary | ICD-10-CM | POA: Insufficient documentation

## 2016-01-02 DIAGNOSIS — R079 Chest pain, unspecified: Secondary | ICD-10-CM | POA: Diagnosis present

## 2016-01-02 DIAGNOSIS — R110 Nausea: Secondary | ICD-10-CM | POA: Diagnosis not present

## 2016-01-02 DIAGNOSIS — Z791 Long term (current) use of non-steroidal anti-inflammatories (NSAID): Secondary | ICD-10-CM | POA: Insufficient documentation

## 2016-01-02 DIAGNOSIS — R0789 Other chest pain: Secondary | ICD-10-CM | POA: Insufficient documentation

## 2016-01-02 DIAGNOSIS — Z880 Allergy status to penicillin: Secondary | ICD-10-CM | POA: Insufficient documentation

## 2016-01-02 DIAGNOSIS — Z79891 Long term (current) use of opiate analgesic: Secondary | ICD-10-CM | POA: Diagnosis not present

## 2016-01-02 DIAGNOSIS — Z7982 Long term (current) use of aspirin: Secondary | ICD-10-CM | POA: Diagnosis not present

## 2016-01-02 DIAGNOSIS — Z79899 Other long term (current) drug therapy: Secondary | ICD-10-CM | POA: Diagnosis not present

## 2016-01-02 DIAGNOSIS — M2550 Pain in unspecified joint: Secondary | ICD-10-CM | POA: Diagnosis not present

## 2016-01-02 DIAGNOSIS — Z7952 Long term (current) use of systemic steroids: Secondary | ICD-10-CM | POA: Insufficient documentation

## 2016-01-02 DIAGNOSIS — Z91040 Latex allergy status: Secondary | ICD-10-CM | POA: Insufficient documentation

## 2016-01-02 DIAGNOSIS — R000 Tachycardia, unspecified: Secondary | ICD-10-CM | POA: Diagnosis not present

## 2016-01-02 DIAGNOSIS — Z7984 Long term (current) use of oral hypoglycemic drugs: Secondary | ICD-10-CM | POA: Insufficient documentation

## 2016-01-02 DIAGNOSIS — E119 Type 2 diabetes mellitus without complications: Secondary | ICD-10-CM | POA: Diagnosis not present

## 2016-01-02 LAB — COMPREHENSIVE METABOLIC PANEL
ALT: 18 U/L (ref 14–54)
ANION GAP: 5 (ref 5–15)
AST: 24 U/L (ref 15–41)
Albumin: 3.5 g/dL (ref 3.5–5.0)
Alkaline Phosphatase: 77 U/L (ref 38–126)
BILIRUBIN TOTAL: 0.4 mg/dL (ref 0.3–1.2)
BUN: 26 mg/dL — AB (ref 6–20)
CO2: 24 mmol/L (ref 22–32)
Calcium: 8.3 mg/dL — ABNORMAL LOW (ref 8.9–10.3)
Chloride: 113 mmol/L — ABNORMAL HIGH (ref 101–111)
Creatinine, Ser: 0.84 mg/dL (ref 0.44–1.00)
Glucose, Bld: 148 mg/dL — ABNORMAL HIGH (ref 65–99)
POTASSIUM: 3.8 mmol/L (ref 3.5–5.1)
Sodium: 142 mmol/L (ref 135–145)
TOTAL PROTEIN: 6.3 g/dL — AB (ref 6.5–8.1)

## 2016-01-02 LAB — CBC
HEMATOCRIT: 39.1 % (ref 35.0–47.0)
Hemoglobin: 12.5 g/dL (ref 12.0–16.0)
MCH: 27.7 pg (ref 26.0–34.0)
MCHC: 32 g/dL (ref 32.0–36.0)
MCV: 86.4 fL (ref 80.0–100.0)
Platelets: 150 10*3/uL (ref 150–440)
RBC: 4.52 MIL/uL (ref 3.80–5.20)
RDW: 16.1 % — AB (ref 11.5–14.5)
WBC: 8.7 10*3/uL (ref 3.6–11.0)

## 2016-01-02 LAB — TROPONIN I

## 2016-01-02 NOTE — ED Notes (Signed)
Pt to triage via wheelchair.   Pt has left side chest pain since this 1500 today.  Pt states it hurts to take a deep breath.  Nonsmoker.  Pt has lupus.  Pt alert.

## 2016-01-03 ENCOUNTER — Emergency Department
Admission: EM | Admit: 2016-01-03 | Discharge: 2016-01-03 | Disposition: A | Discharge status: Home / Self Care | Payer: Medicare Other | Attending: Emergency Medicine | Admitting: Emergency Medicine

## 2016-01-03 ENCOUNTER — Encounter: Payer: Self-pay | Admitting: Radiology

## 2016-01-03 ENCOUNTER — Emergency Department: Payer: Medicare Other

## 2016-01-03 DIAGNOSIS — R0781 Pleurodynia: Secondary | ICD-10-CM

## 2016-01-03 DIAGNOSIS — G8929 Other chronic pain: Secondary | ICD-10-CM

## 2016-01-03 HISTORY — DX: Unspecified asthma, uncomplicated: J45.909

## 2016-01-03 MED ORDER — SODIUM CHLORIDE 0.9 % IV BOLUS (SEPSIS)
500.0000 mL | INTRAVENOUS | Status: AC
  Administered 2016-01-03: 500 mL via INTRAVENOUS

## 2016-01-03 MED ORDER — ONDANSETRON HCL 4 MG/2ML IJ SOLN
INTRAMUSCULAR | Status: AC
  Administered 2016-01-03: 4 mg via INTRAVENOUS
  Filled 2016-01-03: qty 2

## 2016-01-03 MED ORDER — MORPHINE SULFATE (PF) 4 MG/ML IV SOLN
4.0000 mg | Freq: Once | INTRAVENOUS | Status: AC
  Administered 2016-01-03: 4 mg via INTRAVENOUS
  Filled 2016-01-03: qty 1

## 2016-01-03 MED ORDER — ONDANSETRON HCL 4 MG/2ML IJ SOLN
4.0000 mg | Freq: Once | INTRAMUSCULAR | Status: AC
  Administered 2016-01-03: 4 mg via INTRAVENOUS

## 2016-01-03 MED ORDER — IOHEXOL 350 MG/ML SOLN
100.0000 mL | Freq: Once | INTRAVENOUS | Status: AC | PRN
  Administered 2016-01-03: 100 mL via INTRAVENOUS

## 2016-01-03 MED ORDER — ONDANSETRON HCL 4 MG/2ML IJ SOLN
4.0000 mg | Freq: Once | INTRAMUSCULAR | Status: AC
  Administered 2016-01-03: 4 mg via INTRAVENOUS
  Filled 2016-01-03: qty 2

## 2016-01-03 NOTE — ED Notes (Signed)
Dr. Forbach at bedside.  

## 2016-01-03 NOTE — ED Notes (Signed)
Patient medicated for pain and nausea. To CT scan with staff.

## 2016-01-03 NOTE — ED Provider Notes (Signed)
[facility] Emergency Department Provider Note  ____________________________________________  Time seen: Approximately 1:10 AM  I have reviewed the triage vital signs and the nursing notes.   HISTORY  Chief Complaint Chest Pain    HPI [name] [name] is a 50 y.o. female with an 11 year history of lupus, chronic pain, and multiple associated medical conditions who is followed by a doctor that has recently moved to [place].  She presents tonight with complaint of left-sided chest pain that started earlier today.  She states that taking a deep breath makes it worse and the pain is severe.  She has had some mild shortness of breath associated with the pain.  She has had some nausea but no vomiting.  Nothing makes the pain better including her meloxicam and Percocet that she has at home.  Deep breaths and exertion makes the pain worse.  She states that it feels similar to prior pain that she has suffered from for multiple years.  She thinks that the recent change to meloxicam from a different medication may be the cause because it is not working as well.  She denies fever/chills, neck pain, headache, abdominal pain, dysuria.  She states that her typical polyarthralgia is worse than usual.  Overall her pain is severe.   Past Medical History  Diagnosis Date  . ANXIETY 06/06/2007  . BACK PAIN 02/22/2009  . DEPRESSION 06/06/2007  . FREQUENCY, URINARY 07/07/2007  . GERD 06/06/2007  . HYPOTHYROIDISM 06/06/2007  . INTERSTITIAL CYSTITIS 09/11/2010  . NEPHROLITHIASIS, HX OF 11/05/2010  . OSTEOPENIA 10/14/2009  . OVERACTIVE BLADDER 10/14/2009  . PAIN, CHRONIC NEC 06/06/2007  . POLYARTHRITIS 08/19/2007  . 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 WEIGHT GAIN 02/22/2009  . Diabetes mellitus without complication ([place])   . Lupus ([place]) 2006  . Cervical cancer ([place])   . Lupus ([place])   . Asthma     Patient Active Problem List   Diagnosis Date Noted  . Sicca syndrome ([place]) 06/04/2014   . Abdominal pain, epigastric 09/08/2013  . Type II or unspecified type diabetes mellitus without mention of complication, uncontrolled 03/15/2013  . UTI (urinary tract infection) 02/15/2013  . Chronic pain 03/31/2011  . NEPHROLITHIASIS, HX OF 11/05/2010  . INTERSTITIAL CYSTITIS 09/11/2010  . OVERACTIVE BLADDER 10/14/2009  . OSTEOPENIA 10/14/2009  . BACK PAIN 02/22/2009  . WEIGHT GAIN 02/22/2009  . 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 POLYARTHRITIS 08/19/2007  . FREQUENCY, URINARY 07/07/2007  . HYPOTHYROIDISM 06/06/2007  . ANXIETY 06/06/2007  . DEPRESSION 06/06/2007  . PAIN, CHRONIC NEC 06/06/2007  . GERD 06/06/2007    Past Surgical History  Procedure Laterality Date  . Appendectomy    . Tonsillectomy    . Wrist reconstruction    . Revision total hip arthroplasty  06-2009  . Breast surgery      Current Outpatient Rx  Name  Route  Sig  Dispense  Refill  . ABILIFY 15 MG tablet   Oral   Take 15 mg by mouth daily.          Marland Kitchen albuterol (PROVENTIL HFA) 108 (90 BASE) MCG/ACT inhaler   Inhalation   Inhale 2 puffs into the lungs every 4 (four) hours as needed for wheezing or shortness of breath.          . Ascorbic Acid (VITAMIN C GUMMIE PO)   Oral   Take 4 tablets by mouth every morning.         Marland Kitchen aspirin 81 MG tablet   Oral  Take 81 mg by mouth daily.         Marland Kitchen azaTHIOprine (IMURAN) 50 MG tablet   Oral   Take 100 mg by mouth daily.         . Calcium-Phosphorus-Vitamin D (CALCIUM GUMMIES PO)   Oral   Take 2 tablets by mouth every morning.         . celecoxib (CELEBREX) 200 MG capsule               . Cholecalciferol (VITAMIN D) 2000 UNITS CAPS   Oral   Take 2,000 Units by mouth every morning.          . dapsone 25 MG tablet   Oral   Take 25 mg by mouth every morning.          . diclofenac sodium (VOLTAREN) 1 % GEL   Topical   Apply 2 g topically 4 (four) times daily.         . diphenhydrAMINE (BENYLIN) 12.5 MG/5ML syrup   Oral    Take 10 mLs (25 mg total) by mouth 4 (four) times daily as needed for allergies. Mixed with 5 mL of viscous lidocaine for swish and swallow.   240 mL   0   . DULoxetine (CYMBALTA) 60 MG capsule   Oral   Take 60 mg by mouth 2 (two) times daily.         . fentaNYL (DURAGESIC - DOSED MCG/HR) 75 MCG/HR   Transdermal   Place 75 mcg onto the skin every 3 (three) days.         . folic acid (FOLVITE) 1 MG tablet   Oral   Take 1 mg by mouth daily.         Marland Kitchen HYDROcodone-acetaminophen (NORCO/VICODIN) 5-325 MG tablet   Oral   Take 1 tablet by mouth every 4 (four) hours as needed for moderate pain.   10 tablet   0   . levothyroxine (SYNTHROID, LEVOTHROID) 25 MCG tablet   Oral   Take 1 tablet (25 mcg total) by mouth daily before breakfast.   90 tablet   1   . lidocaine (XYLOCAINE) 2 % solution   Mouth/Throat   Use as directed 5 mLs in the mouth or throat every 6 (six) hours as needed for mouth pain. Makes with Benadryl elixir swish and swallow   100 mL   0   . LORazepam (ATIVAN) 1 MG tablet   Oral   Take 1-2 mg by mouth 3 (three) times daily. 1mg  am and afternoon and 2 mg at bedtime         . metFORMIN (GLUCOPHAGE-XR) 500 MG 24 hr tablet   Oral   Take 1 tablet (500 mg total) by mouth 2 (two) times daily.   180 tablet   3   . methotrexate 25 MG/ML injection   Subcutaneous   Inject 25 mg into the skin once a week. On saturdays         . methylphenidate (CONCERTA) 27 MG CR tablet   Oral   Take 27 mg by mouth every morning.         . Multiple Vitamins-Minerals (HM MULTIVITAMIN ADULT GUMMY PO)   Oral   Take 2 tablets by mouth daily.         . ondansetron (ZOFRAN-ODT) 4 MG disintegrating tablet               . oxyCODONE-acetaminophen (PERCOCET) 10-325 MG per tablet   Oral   Take  1 tablet by mouth every 6 (six) hours as needed for pain.          . pantoprazole (PROTONIX) 40 MG tablet      Take one tablet by mouth twice daily   180 tablet   3   .  predniSONE (DELTASONE) 10 MG tablet   Oral   Take 10 mg by mouth daily.          . pregabalin (LYRICA) 50 MG capsule   Oral   Take 1 capsule (50 mg total) by mouth at bedtime.   30 capsule   11   . Pyridoxal-5 Phosphate POWD   Oral   Take by mouth.         . topiramate (TOPAMAX) 100 MG tablet      Take one tablet by mouth twice daily   180 tablet   1   . traMADol (ULTRAM) 50 MG tablet   Oral   Take 1 tablet (50 mg total) by mouth every 8 (eight) hours as needed.   60 tablet   0   . triamcinolone cream (KENALOG) 0.1 %   Topical   Apply 1 application topically 2 (two) times daily.   85.2 g   1   . valACYclovir (VALTREX) 500 MG tablet   Oral   Take 500 mg by mouth at bedtime.            Allergies Latex; Clarithromycin; Compazine; Dhea; Fish allergy; Prasterone; Promethazine hcl; Hydromorphone hcl; and Penicillins  Family History  Problem Relation Age of Onset  . Hypertension Mother   . Arthritis Mother   . Asthma Sister   . Asthma Daughter   . Diabetes Maternal Grandmother   . Heart disease Maternal Grandmother   . Colon cancer Maternal Uncle 45  . Pancreatic cancer Father   . Crohn's disease Maternal Aunt   . Diabetes Maternal Aunt     Social History Social History  Substance Use Topics  . Smoking status: Never Smoker   . Smokeless tobacco: Never Used  . Alcohol Use: No    Review of Systems Constitutional: No fever/chills Eyes: No visual changes. ENT: No sore throat. Cardiovascular: Severe left-sided chest pain worse with deep breaths and exertion Respiratory: Mild shortness of breath associated with the chest pain Gastrointestinal: No abdominal pain.  nausea, no vomiting.  No diarrhea.  No constipation. Genitourinary: Negative for dysuria. Musculoskeletal: Acute on chronic pain in all of her joints Skin: Negative for rash. Neurological: Negative for headaches, focal weakness or numbness.  10-point ROS otherwise  negative.  ____________________________________________   PHYSICAL EXAM:  VITAL SIGNS: ED Triage Vitals  Enc Vitals Group     BP 01/02/16 2148 139/76 mmHg     Pulse Rate 01/02/16 2148 107     Resp 01/02/16 2148 18     Temp 01/02/16 2148 98.1 F (36.7 C)     Temp Source 01/02/16 2148 Oral     SpO2 01/02/16 2148 96 %     Weight 01/02/16 2148 205 lb (92.987 kg)     Height 01/02/16 2148 5\' 10"  (1.778 m)     Head Cir --      Peak Flow --      Pain Score 01/02/16 2152 9     Pain Loc --      Pain Edu? --      Excl. in [place]? --     Constitutional: Alert and oriented.  Generally well-appearing although she became tremulous when I entered the room  and endorses severe pain everywhere. Eyes: Conjunctivae are normal. PERRL. EOMI. Head: Atraumatic. Nose: No congestion/rhinnorhea. Mouth/Throat: Mucous membranes are moist.  Oropharynx non-erythematous. Neck: No stridor.  No meningismus Cardiovascular: Borderline tachycardia, regular rhythm. Grossly normal heart sounds.  Good peripheral circulation.  No reproducible chest wall tenderness. Respiratory: Normal respiratory effort.  No retractions. Lungs CTAB. Gastrointestinal: Soft and nontender. No distention. No abdominal bruits. No CVA tenderness. Musculoskeletal: No lower extremity tenderness nor edema.  No joint effusions. Neurologic:  Normal speech and language. No gross focal neurologic deficits are appreciated.  Skin:  Skin is warm, dry and intact. No rash noted. Psychiatric: Mood and affect are flat.   ____________________________________________   LABS (all labs ordered are listed, but only abnormal results are displayed)  Labs Reviewed  CBC - Abnormal; Notable for the following:    RDW 16.1 (*)    All other components within normal limits  COMPREHENSIVE METABOLIC PANEL - Abnormal; Notable for the following:    Chloride 113 (*)    Glucose, Bld 148 (*)    BUN 26 (*)    Calcium 8.3 (*)    Total Protein 6.3 (*)    All other  components within normal limits  TROPONIN I   ____________________________________________  EKG  ED ECG REPORT I, Relda Agosto, the attending physician, personally viewed and interpreted this ECG.  Date: 01/02/2016 EKG Time: 21:47 Rate: 107 Rhythm: Mild sinus tachycardia QRS Axis: normal Intervals: normal ST/T Wave abnormalities: normal Conduction Disturbances: none Narrative Interpretation: unremarkable  ____________________________________________  RADIOLOGY   Dg Chest 2 View  01/02/2016  CLINICAL DATA:  Chest pain EXAM: CHEST  2 VIEW COMPARISON:  09/16/2015 FINDINGS: Normal heart size and mediastinal contours. No acute infiltrate or edema. Stable mild biapical pleural based scarring. No effusion or pneumothorax. No acute osseous findings. IMPRESSION: Stable chest.  No acute finding. Electronically Signed   By: Monte Fantasia M.D.   On: 01/02/2016 22:13   Ct Angio Chest Pe W/cm &/or Wo Cm  01/03/2016  CLINICAL DATA:  Left-sided pleuritic chest pain today. Shortness of breath. History of lupus. EXAM: CT ANGIOGRAPHY CHEST WITH CONTRAST TECHNIQUE: Multidetector CT imaging of the chest was performed using the standard protocol during bolus administration of intravenous contrast. Multiplanar CT image reconstructions and MIPs were obtained to evaluate the vascular anatomy. CONTRAST:  170mL OMNIPAQUE IOHEXOL 350 MG/ML SOLN COMPARISON:  Radiographs yesterday. FINDINGS: There are no filling defects within the pulmonary arteries to suggest pulmonary embolus. Thoracic aorta normal in caliber, there is a conventional branching pattern from the aortic arch. No aortic dissection. Heart is normal in size. No mediastinal or hilar adenopathy. No pleural or pericardial effusion. Dependent opacities at the lung bases suggestive of hypoventilatory change. No confluent airspace disease. No pulmonary mass or suspicious nodule. No acute abnormality in the included upper abdomen. Small hiatal hernia. There  are no acute or suspicious osseous abnormalities. Review of the MIP images confirms the above findings. IMPRESSION: 1. No pulmonary embolus. 2. Dependent opacities at the lung bases suggestive of hypoventilatory change. Electronically Signed   By: Jeb Levering M.D.   On: 01/03/2016 02:40    ____________________________________________   PROCEDURES  Procedure(s) performed: None  Critical Care performed: No ____________________________________________   INITIAL IMPRESSION / ASSESSMENT AND PLAN / ED COURSE  Pertinent labs & imaging results that were available during my care of the patient were reviewed by me and considered in my medical decision making (see chart for details).  I suspect that the patient's acute on  chronic pain is more the result of inadequate outpatient pain control and then an emergent medical condition.  However she is complaining of pleuritic chest pain and associated shortness of breath and she has not at low risk of PE.  Her labs are reassuring.  I will obtain a CTA chest to rule out pulmonary embolism and I explained if this is negative then she will need to follow up as an outpatient for better control of her chronic pain.  In the meantime I am giving her morphine and Zofran.  Her HEART score is at most 2 which is low risk with a normal EKG and a negative troponin.  Nothing about this sounds like ACS and she does not need a second troponin particularly since the chest pain began nearly 12 hours ago.  ----------------------------------------- 3:16 AM on 01/03/2016 -----------------------------------------  CTA chest - negative.  Discussed the importance of outpatient follow-up with the patient for her acute on chronic pain.  No indication for emergent medical condition at this time.  She is hemodynamically stable and she understands and agrees with the plan.  I gave my usual and customary return precautions.      ____________________________________________  FINAL CLINICAL IMPRESSION(S) / ED DIAGNOSES  Final diagnoses:  Pleuritic chest pain  Chronic pain      NEW MEDICATIONS STARTED DURING THIS VISIT:  New Prescriptions   No medications on file     Hinda Kehr, MD 01/03/16 [phone]

## 2016-01-03 NOTE — Discharge Instructions (Signed)
As we discussed, your workup today was reassuring.  Though we do not know exactly what is causing your symptoms, it appears that you have no emergent medical condition at this time are safe to go home and follow up as recommended in this paperwork.  Your blood work, urinalysis, and CT scan of your chest were all reassuring.    Continue taking all your regular medications and follow up with your doctor at the next available opportunity.  Please return immediately to the Emergency Department if you develop any new or worsening symptoms that concern you.   Nonspecific Chest Pain  Chest pain can be caused by many different conditions. There is always a chance that your pain could be related to something serious, such as a heart attack or a blood clot in your lungs. Chest pain can also be caused by conditions that are not life-threatening. If you have chest pain, it is very important to follow up with your health care provider. CAUSES  Chest pain can be caused by:  Heartburn.  Pneumonia or bronchitis.  Anxiety or stress.  Inflammation around your heart (pericarditis) or lung (pleuritis or pleurisy).  A blood clot in your lung.  A collapsed lung (pneumothorax). It can develop suddenly on its own (spontaneous pneumothorax) or from trauma to the chest.  Shingles infection (varicella-zoster virus).  Heart attack.  Damage to the bones, muscles, and cartilage that make up your chest wall. This can include:  Bruised bones due to injury.  Strained muscles or cartilage due to frequent or repeated coughing or overwork.  Fracture to one or more ribs.  Sore cartilage due to inflammation (costochondritis). RISK FACTORS  Risk factors for chest pain may include:  Activities that increase your risk for trauma or injury to your chest.  Respiratory infections or conditions that cause frequent coughing.  Medical conditions or overeating that can cause heartburn.  Heart disease or family history  of heart disease.  Conditions or health behaviors that increase your risk of developing a blood clot.  Having had chicken pox (varicella zoster). SIGNS AND SYMPTOMS Chest pain can feel like:  Burning or tingling on the surface of your chest or deep in your chest.  Crushing, pressure, aching, or squeezing pain.  Dull or sharp pain that is worse when you move, cough, or take a deep breath.  Pain that is also felt in your back, neck, shoulder, or arm, or pain that spreads to any of these areas. Your chest pain may come and go, or it may stay constant. DIAGNOSIS Lab tests or other studies may be needed to find the cause of your pain. Your health care provider may have you take a test called an ambulatory ECG (electrocardiogram). An ECG records your heartbeat patterns at the time the test is performed. You may also have other tests, such as:  Transthoracic echocardiogram (TTE). During echocardiography, sound waves are used to create a picture of all of the heart structures and to look at how blood flows through your heart.  Transesophageal echocardiogram (TEE).This is a more advanced imaging test that obtains images from inside your body. It allows your health care provider to see your heart in finer detail.  Cardiac monitoring. This allows your health care provider to monitor your heart rate and rhythm in real time.  Holter monitor. This is a portable device that records your heartbeat and can help to diagnose abnormal heartbeats. It allows your health care provider to track your heart activity for several days, if  needed.  Stress tests. These can be done through exercise or by taking medicine that makes your heart beat more quickly.  Blood tests.  Imaging tests. TREATMENT  Your treatment depends on what is causing your chest pain. Treatment may include:  Medicines. These may include:  Acid blockers for heartburn.  Anti-inflammatory medicine.  Pain medicine for inflammatory  conditions.  Antibiotic medicine, if an infection is present.  Medicines to dissolve blood clots.  Medicines to treat coronary artery disease.  Supportive care for conditions that do not require medicines. This may include:  Resting.  Applying heat or cold packs to injured areas.  Limiting activities until pain decreases. HOME CARE INSTRUCTIONS  If you were prescribed an antibiotic medicine, finish it all even if you start to feel better.  Avoid any activities that bring on chest pain.  Do not use any tobacco products, including cigarettes, chewing tobacco, or electronic cigarettes. If you need help quitting, ask your health care provider.  Do not drink alcohol.  Take medicines only as directed by your health care provider.  Keep all follow-up visits as directed by your health care provider. This is important. This includes any further testing if your chest pain does not go away.  If heartburn is the cause for your chest pain, you may be told to keep your head raised (elevated) while sleeping. This reduces the chance that acid will go from your stomach into your esophagus.  Make lifestyle changes as directed by your health care provider. These may include:  Getting regular exercise. Ask your health care provider to suggest some activities that are safe for you.  Eating a heart-healthy diet. A registered dietitian can help you to learn healthy eating options.  Maintaining a healthy weight.  Managing diabetes, if necessary.  Reducing stress. SEEK MEDICAL CARE IF:  Your chest pain does not go away after treatment.  You have a rash with blisters on your chest.  You have a fever. SEEK IMMEDIATE MEDICAL CARE IF:   Your chest pain is worse.  You have an increasing cough, or you cough up blood.  You have severe abdominal pain.  You have severe weakness.  You faint.  You have chills.  You have sudden, unexplained chest discomfort.  You have sudden, unexplained  discomfort in your arms, back, neck, or jaw.  You have shortness of breath at any time.  You suddenly start to sweat, or your skin gets clammy.  You feel nauseous or you vomit.  You suddenly feel light-headed or dizzy.  Your heart begins to beat quickly, or it feels like it is skipping beats. These symptoms may represent a serious problem that is an emergency. Do not wait to see if the symptoms will go away. Get medical help right away. Call your local emergency services (911 in the [place] Do not drive yourself to the hospital.   This information is not intended to replace advice given to you by your health care provider. Make sure you discuss any questions you have with your health care provider.   Document Released: 08/26/2005 Document Revised: 12/07/2014 Document Reviewed: 06/22/2014 Elsevier Interactive Patient Education 2016 [employer]  Chronic Pain Chronic pain can be defined as pain that is off and on and lasts for 3-6 months or longer. Many things cause chronic pain, which can make it difficult to make a diagnosis. There are many treatment options available for chronic pain. However, finding a treatment that works well for you may require trying various approaches until  the right one is found. Many people benefit from a combination of two or more types of treatment to control their pain. SYMPTOMS  Chronic pain can occur anywhere in the body and can range from mild to very severe. Some types of chronic pain include:  Headache.  Low back pain.  Cancer pain.  Arthritis pain.  Neurogenic pain. This is pain resulting from damage to nerves. People with chronic pain may also have other symptoms such as:  Depression.  Anger.  Insomnia.  Anxiety. DIAGNOSIS  Your health care provider will help diagnose your condition over time. In many cases, the initial focus will be on excluding possible conditions that could be causing the pain. Depending on your symptoms, your health  care provider may order tests to diagnose your condition. Some of these tests may include:   Blood tests.   CT scan.   MRI.   X-rays.   Ultrasounds.   Nerve conduction studies.  You may need to see a specialist.  TREATMENT  Finding treatment that works well may take time. You may be referred to a pain specialist. He or she may prescribe medicine or therapies, such as:   Mindful meditation or yoga.  Shots (injections) of numbing or pain-relieving medicines into the spine or area of pain.  Local electrical stimulation.  Acupuncture.   Massage therapy.   Aroma, color, light, or sound therapy.   Biofeedback.   Working with a physical therapist to keep from getting stiff.   Regular, gentle exercise.   Cognitive or behavioral therapy.   Group support.  Sometimes, surgery may be recommended.  HOME CARE INSTRUCTIONS   Take all medicines as directed by your health care provider.   Lessen stress in your life by relaxing and doing things such as listening to calming music.   Exercise or be active as directed by your health care provider.   Eat a healthy diet and include things such as vegetables, fruits, fish, and lean meats in your diet.   Keep all follow-up appointments with your health care provider.   Attend a support group with others suffering from chronic pain. SEEK MEDICAL CARE IF:   Your pain gets worse.   You develop a new pain that was not there before.   You cannot tolerate medicines given to you by your health care provider.   You have new symptoms since your last visit with your health care provider.  SEEK IMMEDIATE MEDICAL CARE IF:   You feel weak.   You have decreased sensation or numbness.   You lose control of bowel or bladder function.   Your pain suddenly gets much worse.   You develop shaking.  You develop chills.  You develop confusion.  You develop chest pain.  You develop shortness of breath.   MAKE SURE YOU:  Understand these instructions.  Will watch your condition.  Will get help right away if you are not doing well or get worse.   This information is not intended to replace advice given to you by your health care provider. Make sure you discuss any questions you have with your health care provider.   Document Released: 08/08/2002 Document Revised: 07/19/2013 Document Reviewed: 05/12/2013 Elsevier Interactive Patient Education [employer].

## 2016-03-14 ENCOUNTER — Emergency Department: Payer: Medicare Other

## 2016-03-14 ENCOUNTER — Encounter: Payer: Self-pay | Admitting: Emergency Medicine

## 2016-03-14 ENCOUNTER — Emergency Department
Admission: EM | Admit: 2016-03-14 | Discharge: 2016-03-14 | Disposition: A | Discharge status: Home / Self Care | Payer: Medicare Other | Attending: Emergency Medicine | Admitting: Emergency Medicine

## 2016-03-14 DIAGNOSIS — Z79899 Other long term (current) drug therapy: Secondary | ICD-10-CM | POA: Diagnosis not present

## 2016-03-14 DIAGNOSIS — Z791 Long term (current) use of non-steroidal anti-inflammatories (NSAID): Secondary | ICD-10-CM | POA: Diagnosis not present

## 2016-03-14 DIAGNOSIS — E119 Type 2 diabetes mellitus without complications: Secondary | ICD-10-CM | POA: Insufficient documentation

## 2016-03-14 DIAGNOSIS — E039 Hypothyroidism, unspecified: Secondary | ICD-10-CM | POA: Insufficient documentation

## 2016-03-14 DIAGNOSIS — Z794 Long term (current) use of insulin: Secondary | ICD-10-CM | POA: Diagnosis not present

## 2016-03-14 DIAGNOSIS — R202 Paresthesia of skin: Secondary | ICD-10-CM

## 2016-03-14 DIAGNOSIS — Z8541 Personal history of malignant neoplasm of cervix uteri: Secondary | ICD-10-CM | POA: Insufficient documentation

## 2016-03-14 DIAGNOSIS — F329 Major depressive disorder, single episode, unspecified: Secondary | ICD-10-CM | POA: Diagnosis not present

## 2016-03-14 DIAGNOSIS — Z7984 Long term (current) use of oral hypoglycemic drugs: Secondary | ICD-10-CM | POA: Insufficient documentation

## 2016-03-14 DIAGNOSIS — R51 Headache: Secondary | ICD-10-CM | POA: Diagnosis not present

## 2016-03-14 DIAGNOSIS — Z7951 Long term (current) use of inhaled steroids: Secondary | ICD-10-CM | POA: Diagnosis not present

## 2016-03-14 DIAGNOSIS — R209 Unspecified disturbances of skin sensation: Secondary | ICD-10-CM | POA: Insufficient documentation

## 2016-03-14 DIAGNOSIS — R531 Weakness: Secondary | ICD-10-CM | POA: Diagnosis present

## 2016-03-14 DIAGNOSIS — J45909 Unspecified asthma, uncomplicated: Secondary | ICD-10-CM | POA: Insufficient documentation

## 2016-03-14 DIAGNOSIS — Z7982 Long term (current) use of aspirin: Secondary | ICD-10-CM | POA: Insufficient documentation

## 2016-03-14 LAB — CBC WITH DIFFERENTIAL/PLATELET
BASOS ABS: 0 10*3/uL (ref 0–0.1)
Basophils Relative: 0 %
Eosinophils Absolute: 0.1 10*3/uL (ref 0–0.7)
Eosinophils Relative: 1 %
HEMATOCRIT: 41 % (ref 35.0–47.0)
HEMOGLOBIN: 13.3 g/dL (ref 12.0–16.0)
LYMPHS PCT: 13 %
Lymphs Abs: 1.3 10*3/uL (ref 1.0–3.6)
MCH: 26.7 pg (ref 26.0–34.0)
MCHC: 32.3 g/dL (ref 32.0–36.0)
MCV: 82.6 fL (ref 80.0–100.0)
MONO ABS: 0.6 10*3/uL (ref 0.2–0.9)
Monocytes Relative: 6 %
NEUTROS ABS: 7.8 10*3/uL — AB (ref 1.4–6.5)
NEUTROS PCT: 80 %
Platelets: 177 10*3/uL (ref 150–440)
RBC: 4.97 MIL/uL (ref 3.80–5.20)
RDW: 15.6 % — ABNORMAL HIGH (ref 11.5–14.5)
WBC: 9.9 10*3/uL (ref 3.6–11.0)

## 2016-03-14 LAB — ETHANOL

## 2016-03-14 LAB — COMPREHENSIVE METABOLIC PANEL
ALBUMIN: 3.9 g/dL (ref 3.5–5.0)
ALT: 21 U/L (ref 14–54)
ANION GAP: 7 (ref 5–15)
AST: 37 U/L (ref 15–41)
Alkaline Phosphatase: 68 U/L (ref 38–126)
BILIRUBIN TOTAL: 0.5 mg/dL (ref 0.3–1.2)
BUN: 20 mg/dL (ref 6–20)
CO2: 22 mmol/L (ref 22–32)
Calcium: 8.8 mg/dL — ABNORMAL LOW (ref 8.9–10.3)
Chloride: 110 mmol/L (ref 101–111)
Creatinine, Ser: 0.98 mg/dL (ref 0.44–1.00)
GFR calc non Af Amer: >60 mL/min (ref >60)
GLUCOSE: 90 mg/dL (ref 65–99)
Potassium: 4.5 mmol/L (ref 3.5–5.1)
SODIUM: 139 mmol/L (ref 135–145)
TOTAL PROTEIN: 6.4 g/dL — AB (ref 6.5–8.1)

## 2016-03-14 LAB — RETICULOCYTES
RBC.: 4.97 MIL/uL (ref 3.80–5.20)
RETIC COUNT ABSOLUTE: 94.4 10*3/uL (ref 19.0–183.0)
RETIC CT PCT: 1.9 % (ref 0.4–3.1)

## 2016-03-14 LAB — TROPONIN I: Troponin I: <0.03 ng/mL (ref <0.031)

## 2016-03-14 MED ORDER — IBUPROFEN 600 MG PO TABS
600.0000 mg | ORAL_TABLET | Freq: Once | ORAL | Status: AC
  Administered 2016-03-14: 600 mg via ORAL
  Filled 2016-03-14: qty 1

## 2016-03-14 NOTE — ED Provider Notes (Addendum)
[facility] Emergency Department Provider Note  ____________________________________________   I have reviewed the triage vital signs and the nursing notes.   HISTORY  Chief Complaint Weakness    HPI [name] [name] is a 50 y.o. female with multiple different medical problems including chronic pain, lupus which causes her to have a tremor, history of hip fracture on the right which she walks with a cane 4, history of weakness on the right leg for 11 years of unclear etiology to this physician, history of chronic pain on multiple different pain medications, history ofanxiety, back pain, reflux disease, thyroid issues, osteopenia, polyarthritis in the past, who has 8 different medical allergies. But however has no no history of CVA presents in the emergency room today complaining of feeling much better. She has chronic headaches and she had a slight headache and she went to bed, was not the worst headache of life, she went to take a nap at 10:00, after she woke up at 120, she was brushing her teeth and it seemed to her the left side her face seemed a little bit less symmetric than the right side. After seemed to her that the folds of her skin were less pronounced than normal, at the same time, she noticed that, she states that she had "a panic attack" and then she had some tingling in the right upper extremity which was present briefly . All of her symptoms lasted for press 5 minutes and then completely resolved. This time she has no complaints feels to be at her baseline. Her last known well therefore was 10:00 which was 5 hours ago      Past Medical History  Diagnosis Date  . ANXIETY 06/06/2007  . BACK PAIN 02/22/2009  . DEPRESSION 06/06/2007  . FREQUENCY, URINARY 07/07/2007  . GERD 06/06/2007  . HYPOTHYROIDISM 06/06/2007  . INTERSTITIAL CYSTITIS 09/11/2010  . NEPHROLITHIASIS, HX OF 11/05/2010  . OSTEOPENIA 10/14/2009  . OVERACTIVE BLADDER 10/14/2009  . PAIN,  CHRONIC NEC 06/06/2007  . POLYARTHRITIS 08/19/2007  . 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 WEIGHT GAIN 02/22/2009  . Diabetes mellitus without complication ([place])   . Lupus ([place]) 2006  . Cervical cancer ([place])   . Lupus ([place])   . Asthma     Patient Active Problem List   Diagnosis Date Noted  . Sicca syndrome ([place]) 06/04/2014  . Abdominal pain, epigastric 09/08/2013  . Type II or unspecified type diabetes mellitus without mention of complication, uncontrolled 03/15/2013  . UTI (urinary tract infection) 02/15/2013  . Chronic pain 03/31/2011  . NEPHROLITHIASIS, HX OF 11/05/2010  . INTERSTITIAL CYSTITIS 09/11/2010  . OVERACTIVE BLADDER 10/14/2009  . OSTEOPENIA 10/14/2009  . BACK PAIN 02/22/2009  . WEIGHT GAIN 02/22/2009  . 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 POLYARTHRITIS 08/19/2007  . FREQUENCY, URINARY 07/07/2007  . HYPOTHYROIDISM 06/06/2007  . ANXIETY 06/06/2007  . DEPRESSION 06/06/2007  . PAIN, CHRONIC NEC 06/06/2007  . GERD 06/06/2007    Past Surgical History  Procedure Laterality Date  . Appendectomy    . Tonsillectomy    . Wrist reconstruction    . Revision total hip arthroplasty  06-2009  . Breast surgery      Current Outpatient Rx  Name  Route  Sig  Dispense  Refill  . ABILIFY 15 MG tablet   Oral   Take 15 mg by mouth daily.          Marland Kitchen albuterol (PROVENTIL HFA) 108 (90 BASE) MCG/ACT inhaler   Inhalation  Inhale 2 puffs into the lungs every 4 (four) hours as needed for wheezing or shortness of breath.          . Ascorbic Acid (VITAMIN C GUMMIE PO)   Oral   Take 4 tablets by mouth every morning.         Marland Kitchen aspirin 81 MG tablet   Oral   Take 81 mg by mouth daily.         Marland Kitchen azaTHIOprine (IMURAN) 50 MG tablet   Oral   Take 100 mg by mouth daily.         . Calcium-Phosphorus-Vitamin D (CALCIUM GUMMIES PO)   Oral   Take 2 tablets by mouth every morning.         . celecoxib (CELEBREX) 200 MG capsule               . Cholecalciferol  (VITAMIN D) 2000 UNITS CAPS   Oral   Take 2,000 Units by mouth every morning.          . dapsone 25 MG tablet   Oral   Take 25 mg by mouth every morning.          . diclofenac sodium (VOLTAREN) 1 % GEL   Topical   Apply 2 g topically 4 (four) times daily.         . diphenhydrAMINE (BENYLIN) 12.5 MG/5ML syrup   Oral   Take 10 mLs (25 mg total) by mouth 4 (four) times daily as needed for allergies. Mixed with 5 mL of viscous lidocaine for swish and swallow.   240 mL   0   . DULoxetine (CYMBALTA) 60 MG capsule   Oral   Take 60 mg by mouth 2 (two) times daily.         . fentaNYL (DURAGESIC - DOSED MCG/HR) 75 MCG/HR   Transdermal   Place 75 mcg onto the skin every 3 (three) days.         . folic acid (FOLVITE) 1 MG tablet   Oral   Take 1 mg by mouth daily.         Marland Kitchen HYDROcodone-acetaminophen (NORCO/VICODIN) 5-325 MG tablet   Oral   Take 1 tablet by mouth every 4 (four) hours as needed for moderate pain.   10 tablet   0   . levothyroxine (SYNTHROID, LEVOTHROID) 25 MCG tablet   Oral   Take 1 tablet (25 mcg total) by mouth daily before breakfast.   90 tablet   1   . lidocaine (XYLOCAINE) 2 % solution   Mouth/Throat   Use as directed 5 mLs in the mouth or throat every 6 (six) hours as needed for mouth pain. Makes with Benadryl elixir swish and swallow   100 mL   0   . LORazepam (ATIVAN) 1 MG tablet   Oral   Take 1-2 mg by mouth 3 (three) times daily. 1mg  am and afternoon and 2 mg at bedtime         . metFORMIN (GLUCOPHAGE-XR) 500 MG 24 hr tablet   Oral   Take 1 tablet (500 mg total) by mouth 2 (two) times daily.   180 tablet   3   . methotrexate 25 MG/ML injection   Subcutaneous   Inject 25 mg into the skin once a week. On saturdays         . methylphenidate (CONCERTA) 27 MG CR tablet   Oral   Take 27 mg by mouth every morning.         Marland Kitchen  Multiple Vitamins-Minerals (HM MULTIVITAMIN ADULT GUMMY PO)   Oral   Take 2 tablets by mouth daily.          . ondansetron (ZOFRAN-ODT) 4 MG disintegrating tablet               . oxyCODONE-acetaminophen (PERCOCET) 10-325 MG per tablet   Oral   Take 1 tablet by mouth every 6 (six) hours as needed for pain.          . pantoprazole (PROTONIX) 40 MG tablet      Take one tablet by mouth twice daily   180 tablet   3   . predniSONE (DELTASONE) 10 MG tablet   Oral   Take 10 mg by mouth daily.          . pregabalin (LYRICA) 50 MG capsule   Oral   Take 1 capsule (50 mg total) by mouth at bedtime.   30 capsule   11   . Pyridoxal-5 Phosphate POWD   Oral   Take by mouth.         . topiramate (TOPAMAX) 100 MG tablet      Take one tablet by mouth twice daily   180 tablet   1   . traMADol (ULTRAM) 50 MG tablet   Oral   Take 1 tablet (50 mg total) by mouth every 8 (eight) hours as needed.   60 tablet   0   . triamcinolone cream (KENALOG) 0.1 %   Topical   Apply 1 application topically 2 (two) times daily.   85.2 g   1   . valACYclovir (VALTREX) 500 MG tablet   Oral   Take 500 mg by mouth at bedtime.            Allergies Latex; Clarithromycin; Compazine; Dhea; Fish allergy; Prasterone; Promethazine hcl; Hydromorphone hcl; and Penicillins  Family History  Problem Relation Age of Onset  . Hypertension Mother   . Arthritis Mother   . Asthma Sister   . Asthma Daughter   . Diabetes Maternal Grandmother   . Heart disease Maternal Grandmother   . Colon cancer Maternal Uncle 46  . Pancreatic cancer Father   . Crohn's disease Maternal Aunt   . Diabetes Maternal Aunt     Social History Social History  Substance Use Topics  . Smoking status: Never Smoker   . Smokeless tobacco: Never Used  . Alcohol Use: No    Review of Systems Constitutional: No fever/chills Eyes: No visual changes. ENT: No sore throat. No stiff neck no neck pain Cardiovascular: Denies chest pain. Respiratory: Denies shortness of breath. Gastrointestinal:   no vomiting.  No diarrhea.   No constipation. Genitourinary: Negative for dysuria. Musculoskeletal: Negative lower extremity swelling Skin: Negative for rash. Neurological: See history of present illness 10-point ROS otherwise negative.  ____________________________________________   PHYSICAL EXAM:  VITAL SIGNS: ED Triage Vitals  Enc Vitals Group     BP 03/14/16 1428 138/55 mmHg     Pulse Rate 03/14/16 1428 97     Resp 03/14/16 1428 18     Temp 03/14/16 1428 98.4 F (36.9 C)     Temp src --      SpO2 03/14/16 1428 98 %     Weight 03/14/16 1428 223 lb (101.152 kg)     Height 03/14/16 1428 5\' 10"  (1.778 m)     Head Cir --      Peak Flow --      Pain Score 03/14/16 1429 8  Pain Loc --      Pain Edu? --      Excl. in [place]? --     Constitutional: Alert and oriented. Well appearing and in no acute distress. Eyes: Conjunctivae are normal. PERRL. EOMI. Head: Atraumatic. Nose: No congestion/rhinnorhea. Mouth/Throat: Mucous membranes are moist.  Oropharynx non-erythematous. Neck: No stridor.   Nontender with no meningismus Cardiovascular: Normal rate, regular rhythm. Grossly normal heart sounds.  Good peripheral circulation. Respiratory: Normal respiratory effort.  No retractions. Lungs CTAB. Abdominal: Soft and nontender. No distention. No guarding no rebound Back:  There is no focal tenderness or step off there is no midline tenderness there are no lesions noted. there is no CVA tenderness Musculoskeletal: No lower extremity tenderness. No joint effusions, no DVT signs strong distal pulses no edema Neurologic:  Normal speech and language. No gross focal neurologic deficits are appreciated. Patient has a baseline tremor cranial nerves II through XII are grossly intact, I do not elicit any evidence of focal neurologic deficit. Finger to nose is within normal limits given her baseline diffuse tremor. Skin:  Skin is warm, dry and intact. No rash noted. Psychiatric: Mood and affect are normal. Speech and  behavior are normal.  ____________________________________________   LABS (all labs ordered are listed, but only abnormal results are displayed)  Labs Reviewed  COMPREHENSIVE METABOLIC PANEL  CBC WITH DIFFERENTIAL/PLATELET  TROPONIN I  RETICULOCYTES  ETHANOL  URINALYSIS COMPLETEWITH MICROSCOPIC ([place])  URINE DRUG SCREEN, QUALITATIVE ([facility] ONLY)   ____________________________________________  EKG  I personally interpreted any EKGs ordered by me or triage  ____________________________________________  RADIOLOGY  I reviewed any imaging ordered by me or triage that were performed during my shift and, if possible, patient and/or family made aware of any abnormal findings. ____________________________________________   PROCEDURES  Procedure(s) performed: None  Critical Care performed: None  ____________________________________________   INITIAL IMPRESSION / ASSESSMENT AND PLAN / ED COURSE  Pertinent labs & imaging results that were available during my care of the patient were reviewed by me and considered in my medical decision making (see chart for details).  Patient woke up from a nap and noted the left side of her face looked funny to her and the right arm felt unusually tingly for her. Symptoms lasted for 5 minutes and then resolved. This is clearly not in the anatomic distribution of what would normally be anticipated for an acute CVA. Nonetheless we'll obtain a CT scan and check blood work, at this time her NIH stroke scale is 0 to the extent that one can determine given her tremor, and she has no complaints. Patient's last known well was 10:00 this morning so even if she felt that her nonanatomic symptoms were persistent, she certainly would not be a candidate for TPA given that she has at time of onset that's over 5 hours ago. In addition, her symptoms are gone and this again makes her not a candidate for TPA.  ----------------------------------------- 4:25 PM  on 03/14/2016 -----------------------------------------   I did discuss with our on called telemetry neurologist, he feels the patient is safe for discharge for outpatient follow-up given history.  Patient's only complaint at this time is a mild headache for which she would like Advil. Ace and has been here essentially once a month with a different complaint for the last 4 or 5 months. At this time there is no evidence of CVA and NIH stroke scale of 0, we will discharge her she does have her own neurologist but we will  give her close outpatient follow-up. Return precautions given and understood.     ____________________________________________   FINAL CLINICAL IMPRESSION(S) / ED DIAGNOSES  Final diagnoses:  None      This chart was dictated using voice recognition software.  Despite best efforts to proofread,  errors can occur which can change meaning.     Schuyler Amor, MD 03/14/16 [place], MD 03/14/16 [place], MD 03/14/16 1630

## 2016-03-14 NOTE — ED Notes (Signed)
Patient arrives to [facility] ED [facility] from home with c/o weakness. Patient states that she woke today at 1330 with a headache and left sided facial droop and right sided weakness. Patient states that she went to sleep with the headache and woke without resolution. EMS reports stroke negative on scene. Patient alert, oriented, and symmetrical upon arrival to ED

## 2016-03-14 NOTE — Discharge Instructions (Signed)
Paresthesia Paresthesia is an abnormal burning or prickling sensation. This sensation is generally felt in the hands, arms, legs, or feet. However, it may occur in any part of the body. Usually, it is not painful. The feeling may be described as:  Tingling or numbness.  Pins and needles.  Skin crawling.  Buzzing.  Limbs falling asleep.  Itching. Most people experience temporary (transient) paresthesia at some time in their lives. Paresthesia may occur when you breathe too quickly (hyperventilation). It can also occur without any apparent cause. Commonly, paresthesia occurs when pressure is placed on a nerve. The sensation quickly goes away after the pressure is removed. For some people, however, paresthesia is a long-lasting (chronic) condition that is caused by an underlying disorder. If you continue to have paresthesia, you may need further medical evaluation. HOME CARE INSTRUCTIONS Watch your condition for any changes. Taking the following actions may help to lessen any discomfort that you are feeling:  Avoid drinking alcohol.  Try acupuncture or massage to help relieve your symptoms.  Keep all follow-up visits as directed by your health care provider. This is important. SEEK MEDICAL CARE IF:  You continue to have episodes of paresthesia.  Your burning or prickling feeling gets worse when you walk.  You have pain, cramps, or dizziness.  You develop a rash. SEEK IMMEDIATE MEDICAL CARE IF:  You feel weak.  You have trouble walking or moving.  You have problems with speech, understanding, or vision.  You feel confused.  You cannot control your bladder or bowel movements.  You have numbness after an injury.  You faint.   This information is not intended to replace advice given to you by your health care provider. Make sure you discuss any questions you have with your health care provider.   Document Released: 11/06/2002 Document Revised: 04/02/2015 Document Reviewed:  11/12/2014 Elsevier Interactive Patient Education 2016 [employer]  

## 2016-03-14 NOTE — ED Notes (Signed)
Pt verbalized understanding of discharge instructions. NAD at this time. 

## 2016-03-22 ENCOUNTER — Emergency Department: Payer: Medicare Other

## 2016-03-22 ENCOUNTER — Emergency Department
Admission: EM | Admit: 2016-03-22 | Discharge: 2016-03-22 | Disposition: A | Discharge status: Home / Self Care | Payer: Medicare Other | Attending: Emergency Medicine | Admitting: Emergency Medicine

## 2016-03-22 ENCOUNTER — Encounter: Payer: Self-pay | Admitting: Emergency Medicine

## 2016-03-22 DIAGNOSIS — Z7984 Long term (current) use of oral hypoglycemic drugs: Secondary | ICD-10-CM | POA: Insufficient documentation

## 2016-03-22 DIAGNOSIS — Y929 Unspecified place or not applicable: Secondary | ICD-10-CM | POA: Insufficient documentation

## 2016-03-22 DIAGNOSIS — E119 Type 2 diabetes mellitus without complications: Secondary | ICD-10-CM | POA: Diagnosis not present

## 2016-03-22 DIAGNOSIS — Z8541 Personal history of malignant neoplasm of cervix uteri: Secondary | ICD-10-CM | POA: Insufficient documentation

## 2016-03-22 DIAGNOSIS — F329 Major depressive disorder, single episode, unspecified: Secondary | ICD-10-CM | POA: Diagnosis not present

## 2016-03-22 DIAGNOSIS — J45909 Unspecified asthma, uncomplicated: Secondary | ICD-10-CM | POA: Diagnosis not present

## 2016-03-22 DIAGNOSIS — Y999 Unspecified external cause status: Secondary | ICD-10-CM | POA: Diagnosis not present

## 2016-03-22 DIAGNOSIS — Y939 Activity, unspecified: Secondary | ICD-10-CM | POA: Insufficient documentation

## 2016-03-22 DIAGNOSIS — S7002XA Contusion of left hip, initial encounter: Secondary | ICD-10-CM | POA: Diagnosis not present

## 2016-03-22 DIAGNOSIS — S0093XA Contusion of unspecified part of head, initial encounter: Secondary | ICD-10-CM | POA: Diagnosis not present

## 2016-03-22 DIAGNOSIS — E039 Hypothyroidism, unspecified: Secondary | ICD-10-CM | POA: Insufficient documentation

## 2016-03-22 DIAGNOSIS — Z794 Long term (current) use of insulin: Secondary | ICD-10-CM | POA: Insufficient documentation

## 2016-03-22 DIAGNOSIS — Z79899 Other long term (current) drug therapy: Secondary | ICD-10-CM | POA: Diagnosis not present

## 2016-03-22 DIAGNOSIS — W06XXXA Fall from bed, initial encounter: Secondary | ICD-10-CM | POA: Diagnosis not present

## 2016-03-22 DIAGNOSIS — T148XXA Other injury of unspecified body region, initial encounter: Secondary | ICD-10-CM

## 2016-03-22 DIAGNOSIS — S0990XA Unspecified injury of head, initial encounter: Secondary | ICD-10-CM | POA: Diagnosis present

## 2016-03-22 MED ORDER — ONDANSETRON 4 MG PO TBDP
ORAL_TABLET | ORAL | Status: AC
  Filled 2016-03-22: qty 1

## 2016-03-22 MED ORDER — ONDANSETRON 4 MG PO TBDP
4.0000 mg | ORAL_TABLET | Freq: Once | ORAL | Status: AC
  Administered 2016-03-22: 4 mg via ORAL

## 2016-03-22 MED ORDER — IBUPROFEN 400 MG PO TABS
400.0000 mg | ORAL_TABLET | Freq: Once | ORAL | Status: AC | PRN
  Administered 2016-03-22: 400 mg via ORAL
  Filled 2016-03-22: qty 1

## 2016-03-22 NOTE — ED Provider Notes (Signed)
[facility] Emergency Department Provider Note  ____________________________________________  Time seen: Approximately 6:28 AM  I have reviewed the triage vital signs and the nursing notes.   HISTORY  Chief Complaint Fall; Head Injury; and Hip Pain    HPI [name] [name] is a 50 y.o. female with extensive chronic medical history who presents for evaluation of head pain, neck pain, and left hip pain after falling out of bed.  She reports that she struck her forehead on a nightstand and felt stunned but did not lose consciousness.  She also developed pain in her left hip although it is unclear whether she actually hit her hip on the floor, it does seem likely.  At first she thought she was okay but of the acute onset forehead pain, but that she slowly started to develop pain on the top of her head as well as the back of her neck and her left hip, so she decided to come to the emergency department for evaluation.  She reports that her pain is moderate, constant, sharp and aching, and movement makes it worse, nothing makes it better.  She has had some nausea.  She denies fever/chills, chest pain, shortness of breath, abdominal pain, vomiting, dysuria.  Past Medical History  Diagnosis Date  . ANXIETY 06/06/2007  . BACK PAIN 02/22/2009  . DEPRESSION 06/06/2007  . FREQUENCY, URINARY 07/07/2007  . GERD 06/06/2007  . HYPOTHYROIDISM 06/06/2007  . INTERSTITIAL CYSTITIS 09/11/2010  . NEPHROLITHIASIS, HX OF 11/05/2010  . OSTEOPENIA 10/14/2009  . OVERACTIVE BLADDER 10/14/2009  . PAIN, CHRONIC NEC 06/06/2007  . POLYARTHRITIS 08/19/2007  . 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 WEIGHT GAIN 02/22/2009  . Diabetes mellitus without complication ([place])   . Lupus ([place]) 2006  . Cervical cancer ([place])   . Lupus ([place])   . Asthma     Patient Active Problem List   Diagnosis Date Noted  . Sicca syndrome ([place]) 06/04/2014  . Abdominal pain, epigastric 09/08/2013  . Type II or  unspecified type diabetes mellitus without mention of complication, uncontrolled 03/15/2013  . UTI (urinary tract infection) 02/15/2013  . Chronic pain 03/31/2011  . NEPHROLITHIASIS, HX OF 11/05/2010  . INTERSTITIAL CYSTITIS 09/11/2010  . OVERACTIVE BLADDER 10/14/2009  . OSTEOPENIA 10/14/2009  . BACK PAIN 02/22/2009  . WEIGHT GAIN 02/22/2009  . 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 POLYARTHRITIS 08/19/2007  . FREQUENCY, URINARY 07/07/2007  . HYPOTHYROIDISM 06/06/2007  . ANXIETY 06/06/2007  . DEPRESSION 06/06/2007  . PAIN, CHRONIC NEC 06/06/2007  . GERD 06/06/2007    Past Surgical History  Procedure Laterality Date  . Appendectomy    . Tonsillectomy    . Wrist reconstruction    . Revision total hip arthroplasty  06-2009    Current Outpatient Rx  Name  Route  Sig  Dispense  Refill  . ABILIFY 15 MG tablet   Oral   Take 15 mg by mouth daily.          Marland Kitchen albuterol (PROVENTIL HFA) 108 (90 BASE) MCG/ACT inhaler   Inhalation   Inhale 2 puffs into the lungs every 4 (four) hours as needed for wheezing or shortness of breath.          . Ascorbic Acid (VITAMIN C GUMMIE PO)   Oral   Take 4 tablets by mouth every morning.         Marland Kitchen aspirin 81 MG tablet   Oral   Take 81 mg by mouth daily.         Marland Kitchen  azaTHIOprine (IMURAN) 50 MG tablet   Oral   Take 100 mg by mouth daily.         . Calcium-Phosphorus-Vitamin D (CALCIUM GUMMIES PO)   Oral   Take 2 tablets by mouth every morning.         . celecoxib (CELEBREX) 200 MG capsule               . Cholecalciferol (VITAMIN D) 2000 UNITS CAPS   Oral   Take 2,000 Units by mouth every morning.          . dapsone 25 MG tablet   Oral   Take 25 mg by mouth every morning.          . diclofenac sodium (VOLTAREN) 1 % GEL   Topical   Apply 2 g topically 4 (four) times daily.         . diphenhydrAMINE (BENYLIN) 12.5 MG/5ML syrup   Oral   Take 10 mLs (25 mg total) by mouth 4 (four) times daily as needed for  allergies. Mixed with 5 mL of viscous lidocaine for swish and swallow.   240 mL   0   . DULoxetine (CYMBALTA) 60 MG capsule   Oral   Take 60 mg by mouth 2 (two) times daily.         . fentaNYL (DURAGESIC - DOSED MCG/HR) 75 MCG/HR   Transdermal   Place 75 mcg onto the skin every 3 (three) days.         . folic acid (FOLVITE) 1 MG tablet   Oral   Take 1 mg by mouth daily.         Marland Kitchen HYDROcodone-acetaminophen (NORCO/VICODIN) 5-325 MG tablet   Oral   Take 1 tablet by mouth every 4 (four) hours as needed for moderate pain.   10 tablet   0   . levothyroxine (SYNTHROID, LEVOTHROID) 25 MCG tablet   Oral   Take 1 tablet (25 mcg total) by mouth daily before breakfast.   90 tablet   1   . lidocaine (XYLOCAINE) 2 % solution   Mouth/Throat   Use as directed 5 mLs in the mouth or throat every 6 (six) hours as needed for mouth pain. Makes with Benadryl elixir swish and swallow   100 mL   0   . LORazepam (ATIVAN) 1 MG tablet   Oral   Take 1-2 mg by mouth 3 (three) times daily. 1mg  am and afternoon and 2 mg at bedtime         . metFORMIN (GLUCOPHAGE-XR) 500 MG 24 hr tablet   Oral   Take 1 tablet (500 mg total) by mouth 2 (two) times daily.   180 tablet   3   . methotrexate 25 MG/ML injection   Subcutaneous   Inject 25 mg into the skin once a week. On saturdays         . methylphenidate (CONCERTA) 27 MG CR tablet   Oral   Take 27 mg by mouth every morning.         . Multiple Vitamins-Minerals (HM MULTIVITAMIN ADULT GUMMY PO)   Oral   Take 2 tablets by mouth daily.         . ondansetron (ZOFRAN-ODT) 4 MG disintegrating tablet               . oxyCODONE-acetaminophen (PERCOCET) 10-325 MG per tablet   Oral   Take 1 tablet by mouth every 6 (six) hours as needed for pain.          Marland Kitchen  pantoprazole (PROTONIX) 40 MG tablet      Take one tablet by mouth twice daily   180 tablet   3   . predniSONE (DELTASONE) 10 MG tablet   Oral   Take 10 mg by mouth daily.           . pregabalin (LYRICA) 50 MG capsule   Oral   Take 1 capsule (50 mg total) by mouth at bedtime.   30 capsule   11   . Pyridoxal-5 Phosphate POWD   Oral   Take by mouth.         . topiramate (TOPAMAX) 100 MG tablet      Take one tablet by mouth twice daily   180 tablet   1   . traMADol (ULTRAM) 50 MG tablet   Oral   Take 1 tablet (50 mg total) by mouth every 8 (eight) hours as needed.   60 tablet   0   . triamcinolone cream (KENALOG) 0.1 %   Topical   Apply 1 application topically 2 (two) times daily.   85.2 g   1   . valACYclovir (VALTREX) 500 MG tablet   Oral   Take 500 mg by mouth at bedtime.            Allergies Latex; Clarithromycin; Compazine; Dhea; Fish allergy; Prasterone; Promethazine hcl; Hydromorphone hcl; and Penicillins  Family History  Problem Relation Age of Onset  . Hypertension Mother   . Arthritis Mother   . Asthma Sister   . Asthma Daughter   . Diabetes Maternal Grandmother   . Heart disease Maternal Grandmother   . Colon cancer Maternal Uncle 51  . Pancreatic cancer Father   . Crohn's disease Maternal Aunt   . Diabetes Maternal Aunt     Social History Social History  Substance Use Topics  . Smoking status: Never Smoker   . Smokeless tobacco: Never Used  . Alcohol Use: No    Review of Systems Constitutional: No fever/chills Eyes: No visual changes. ENT: No sore throat. Cardiovascular: Denies chest pain. Respiratory: Denies shortness of breath. Gastrointestinal: No abdominal pain.  nausea, no vomiting.  No diarrhea.  No constipation. Genitourinary: Negative for dysuria. Musculoskeletal: Negative for back pain.  +neck pain . +left hip pain. Skin: Negative for rash. Neurological: Headache.  10-point ROS otherwise negative.  ____________________________________________   PHYSICAL EXAM:  VITAL SIGNS: ED Triage Vitals  Enc Vitals Group     BP 03/22/16 0105 124/70 mmHg     Pulse Rate 03/22/16 0105 95      Resp 03/22/16 0105 18     Temp 03/22/16 0105 98.1 F (36.7 C)     Temp Source 03/22/16 0105 Oral     SpO2 03/22/16 0105 95 %     Weight 03/22/16 0105 226 lb (102.513 kg)     Height 03/22/16 0105 5\' 10"  (1.778 m)     Head Cir --      Peak Flow --      Pain Score 03/22/16 0106 9     Pain Loc --      Pain Edu? --      Excl. in [place]? --     Constitutional: Alert and oriented. Well appearing and in no acute distress. Eyes: Conjunctivae are normal. PERRL. EOMI. Head: Atraumatic.No visible or palpable hematoma or contusion on her forehead. Nose: No congestion/rhinnorhea. Mouth/Throat: Mucous membranes are moist.  Oropharynx non-erythematous. Neck: No stridor.  No meningeal signs.  No cervical spine tenderness to palpation. Cardiovascular:  Normal rate, regular rhythm. Good peripheral circulation. Grossly normal heart sounds.   Respiratory: Normal respiratory effort.  No retractions. Lungs CTAB. Gastrointestinal: Soft and nontender. No distention.  Musculoskeletal: No lower extremity tenderness nor edema. No gross deformities of extremities. Neurologic:  Normal speech and language. No gross focal neurologic deficits are appreciated.  Skin:  Skin is warm, dry and intact. No rash noted.  No contusions, ecchymoses, hematomas are noted on her left hip or on her forehead. Psychiatric: Mood and affect are normal. Speech and behavior are normal.  ____________________________________________   LABS (all labs ordered are listed, but only abnormal results are displayed)  Labs Reviewed - No data to display ____________________________________________  EKG  None ____________________________________________  RADIOLOGY   Ct Head Wo Contrast  03/22/2016  CLINICAL DATA:  Fall out of bed striking head on night stand. Headache, nausea and neck pain since fall. EXAM: CT HEAD WITHOUT CONTRAST CT CERVICAL SPINE WITHOUT CONTRAST TECHNIQUE: Multidetector CT imaging of the head and cervical spine was  performed following the standard protocol without intravenous contrast. Multiplanar CT image reconstructions of the cervical spine were also generated. COMPARISON:  Head CT 03/14/2016 FINDINGS: CT HEAD FINDINGS No intracranial hemorrhage, mass effect, or midline shift. No hydrocephalus. The basilar cisterns are patent. No evidence of territorial infarct. No intracranial fluid collection. Calvarium is intact. Included paranasal sinuses and mastoid air cells are well aerated. CT CERVICAL SPINE FINDINGS No acute fracture or subluxation. The dens is intact. There are no jumped or perched facets. Multilevel disc space narrowing and endplate spurring. Multilevel facet arthropathy. Minimal anterolisthesis of C4 on C5 appears degenerative. No prevertebral soft tissue edema. IMPRESSION: 1.  No acute intracranial abnormality. 2. No fracture or subluxation of the cervical spine. Electronically Signed   By: Jeb Levering M.D.   On: 03/22/2016 02:06   Ct Cervical Spine Wo Contrast  03/22/2016  CLINICAL DATA:  Fall out of bed striking head on night stand. Headache, nausea and neck pain since fall. EXAM: CT HEAD WITHOUT CONTRAST CT CERVICAL SPINE WITHOUT CONTRAST TECHNIQUE: Multidetector CT imaging of the head and cervical spine was performed following the standard protocol without intravenous contrast. Multiplanar CT image reconstructions of the cervical spine were also generated. COMPARISON:  Head CT 03/14/2016 FINDINGS: CT HEAD FINDINGS No intracranial hemorrhage, mass effect, or midline shift. No hydrocephalus. The basilar cisterns are patent. No evidence of territorial infarct. No intracranial fluid collection. Calvarium is intact. Included paranasal sinuses and mastoid air cells are well aerated. CT CERVICAL SPINE FINDINGS No acute fracture or subluxation. The dens is intact. There are no jumped or perched facets. Multilevel disc space narrowing and endplate spurring. Multilevel facet arthropathy. Minimal  anterolisthesis of C4 on C5 appears degenerative. No prevertebral soft tissue edema. IMPRESSION: 1.  No acute intracranial abnormality. 2. No fracture or subluxation of the cervical spine. Electronically Signed   By: Jeb Levering M.D.   On: 03/22/2016 02:06   Dg Hip Unilat With Pelvis 2-3 Views Left  03/22/2016  CLINICAL DATA:  Left lateral hip pain after trip and fall this morning. EXAM: DG HIP (WITH OR WITHOUT PELVIS) 2-3V LEFT COMPARISON:  None. FINDINGS: Pelvis and left hip appear intact. No acute displaced fractures identified. No focal bone lesion or bone destruction. SI joints and symphysis pubis are not displaced. Postoperative changes with internal fixation of the right hip. IMPRESSION: Negative. Electronically Signed   By: Lucienne Capers M.D.   On: 03/22/2016 01:44    ____________________________________________   PROCEDURES  Procedure(s)  performed: None  Critical Care performed: No ____________________________________________   INITIAL IMPRESSION / ASSESSMENT AND PLAN / ED COURSE  Pertinent labs & imaging results that were available during my care of the patient were reviewed by me and considered in my medical decision making (see chart for details).  No evidence of serious, emergent, nor life-threatening injury.  VSS, physical exam unremarkable.  CTs and plain films reassuring.  Patient still has headache which I explained should be expected.    I gave my usual and customary return precautions and outpatient management recommendations.  She understands and agrees with the plan.  ____________________________________________  FINAL CLINICAL IMPRESSION(S) / ED DIAGNOSES  Final diagnoses:  Head contusion, initial encounter  Muscle strain  Contusion, hip, left, initial encounter      NEW MEDICATIONS STARTED DURING THIS VISIT:  New Prescriptions   No medications on file      Note:  This document was prepared using Dragon voice recognition software and may  include unintentional dictation errors.   Hinda Kehr, MD 03/22/16 [phone]

## 2016-03-22 NOTE — Discharge Instructions (Signed)
You have been seen in the Emergency Department (ED) today for a fall.  Your work up does not show any concerning injuries.  Please take over-the-counter ibuprofen and/or Tylenol as needed for your pain (unless you have an allergy or your doctor as told you not to take them), or take any prescribed medication as instructed.  Please follow up with your doctor regarding today's Emergency Department (ED) visit and your recent fall.    Return to the ED if you have any headache, confusion, slurred speech, weakness/numbness of any arm or leg, or any increased pain.   Facial or Scalp Contusion A facial or scalp contusion is a deep bruise on the face or head. Injuries to the face and head generally cause a lot of swelling, especially around the eyes. Contusions are the result of an injury that caused bleeding under the skin. The contusion may turn blue, purple, or yellow. Minor injuries will give you a painless contusion, but more severe contusions may stay painful and swollen for a few weeks.  CAUSES  A facial or scalp contusion is caused by a blunt injury or trauma to the face or head area.  SIGNS AND SYMPTOMS   Swelling of the injured area.   Discoloration of the injured area.   Tenderness, soreness, or pain in the injured area.  DIAGNOSIS  The diagnosis can be made by taking a medical history and doing a physical exam. An X-ray exam, CT scan, or MRI may be needed to determine if there are any associated injuries, such as broken bones (fractures). TREATMENT  Often, the best treatment for a facial or scalp contusion is applying cold compresses to the injured area. Over-the-counter medicines may also be recommended for pain control.  HOME CARE INSTRUCTIONS   Only take over-the-counter or prescription medicines as directed by your health care provider.   Apply ice to the injured area.   Put ice in a plastic bag.   Place a towel between your skin and the bag.   Leave the ice on for 20  minutes, 2-3 times a day.  SEEK MEDICAL CARE IF:  You have bite problems.   You have pain with chewing.   You are concerned about facial defects. SEEK IMMEDIATE MEDICAL CARE IF:  You have severe pain or a headache that is not relieved by medicine.   You have unusual sleepiness, confusion, or personality changes.   You throw up (vomit).   You have a persistent nosebleed.   You have double vision or blurred vision.   You have fluid drainage from your nose or ear.   You have difficulty walking or using your arms or legs.  MAKE SURE YOU:   Understand these instructions.  Will watch your condition.  Will get help right away if you are not doing well or get worse.   This information is not intended to replace advice given to you by your health care provider. Make sure you discuss any questions you have with your health care provider.   Document Released: 12/24/2004 Document Revised: 12/07/2014 Document Reviewed: 06/29/2013 Elsevier Interactive Patient Education 2016 [place] Injury, Adult You have a head injury. Headaches and throwing up (vomiting) are common after a head injury. It should be easy to wake up from sleeping. Sometimes you must stay in the hospital. Most problems happen within the first 24 hours. Side effects may occur up to 7-10 days after the injury.  WHAT ARE THE TYPES OF HEAD INJURIES? Head injuries can  be as minor as a bump. Some head injuries can be more severe. More severe head injuries include:  A jarring injury to the brain (concussion).  A bruise of the brain (contusion). This mean there is bleeding in the brain that can cause swelling.  A cracked skull (skull fracture).  Bleeding in the brain that collects, clots, and forms a bump (hematoma). WHEN SHOULD I GET HELP RIGHT AWAY?   You are confused or sleepy.  You cannot be woken up.  You feel sick to your stomach (nauseous) or keep throwing up (vomiting).  Your dizziness  or unsteadiness is getting worse.  You have very bad, lasting headaches that are not helped by medicine. Take medicines only as told by your doctor.  You cannot use your arms or legs like normal.  You cannot walk.  You notice changes in the black spots in the center of the colored part of your eye (pupil).  You have clear or bloody fluid coming from your nose or ears.  You have trouble seeing. During the next 24 hours after the injury, you must stay with someone who can watch you. This person should get help right away (call 911 in the [place] if you start to shake and are not able to control it (have seizures), you pass out, or you are unable to wake up. HOW CAN I PREVENT A HEAD INJURY IN THE FUTURE?  Wear seat belts.  Wear a helmet while bike riding and playing sports like football.  Stay away from dangerous activities around the house. WHEN CAN I RETURN TO NORMAL ACTIVITIES AND ATHLETICS? See your doctor before doing these activities. You should not do normal activities or play contact sports until 1 week after the following symptoms have stopped:  Headache that does not go away.  Dizziness.  Poor attention.  Confusion.  Memory problems.  Sickness to your stomach or throwing up.  Tiredness.  Fussiness.  Bothered by bright lights or loud noises.  Anxiousness or depression.  Restless sleep. MAKE SURE YOU:   Understand these instructions.  Will watch your condition.  Will get help right away if you are not doing well or get worse.   This information is not intended to replace advice given to you by your health care provider. Make sure you discuss any questions you have with your health care provider.   Document Released: 10/29/2008 Document Revised: 12/07/2014 Document Reviewed: 07/24/2013 Elsevier Interactive Patient Education [employer].

## 2016-03-22 NOTE — ED Notes (Signed)
Patient reports that she hit her head on the night stand and "saw stars". Does not report any LOC. Has hip pain associated with the fall

## 2016-03-22 NOTE — ED Notes (Signed)
Disregard pulse and O2 at 0530 validation

## 2016-03-22 NOTE — ED Notes (Addendum)
Patient reports that she fell out of the bed and hit her head on the night stand. Patient reports that since the fall she has a headache, nausea, neck pain and left hip pain. Patient reports that she takes 325 mg asa daily.

## 2016-03-22 NOTE — ED Notes (Signed)
Patient stable and ambulatory. Patient verbalized understanding of the discharge instructions.   

## 2016-03-23 ENCOUNTER — Other Ambulatory Visit: Payer: Self-pay | Admitting: Neurology

## 2016-03-23 DIAGNOSIS — G451 Carotid artery syndrome (hemispheric): Secondary | ICD-10-CM

## 2016-03-30 ENCOUNTER — Ambulatory Visit
Admission: RE | Admit: 2016-03-30 | Discharge: 2016-03-30 | Disposition: A | Discharge status: Home / Self Care | Payer: Medicare Other | Source: Ambulatory Visit | Attending: Neurology | Admitting: Neurology

## 2016-03-30 DIAGNOSIS — G451 Carotid artery syndrome (hemispheric): Secondary | ICD-10-CM | POA: Diagnosis not present

## 2016-07-30 ENCOUNTER — Other Ambulatory Visit: Payer: Self-pay | Admitting: Student

## 2016-07-30 DIAGNOSIS — R1310 Dysphagia, unspecified: Secondary | ICD-10-CM

## 2016-08-05 ENCOUNTER — Ambulatory Visit: Payer: Medicare Other

## 2016-08-05 DIAGNOSIS — N950 Postmenopausal bleeding: Secondary | ICD-10-CM | POA: Diagnosis not present

## 2016-08-11 DIAGNOSIS — N950 Postmenopausal bleeding: Secondary | ICD-10-CM | POA: Diagnosis not present

## 2016-08-11 DIAGNOSIS — D260 Other benign neoplasm of cervix uteri: Secondary | ICD-10-CM | POA: Diagnosis not present

## 2016-08-11 DIAGNOSIS — D261 Other benign neoplasm of corpus uteri: Secondary | ICD-10-CM | POA: Diagnosis not present

## 2016-08-12 ENCOUNTER — Ambulatory Visit
Admission: RE | Admit: 2016-08-12 | Discharge: 2016-08-12 | Disposition: A | Discharge status: Home / Self Care | Payer: Medicare Other | Source: Ambulatory Visit | Attending: Student | Admitting: Student

## 2016-08-12 DIAGNOSIS — K219 Gastro-esophageal reflux disease without esophagitis: Secondary | ICD-10-CM | POA: Insufficient documentation

## 2016-08-12 DIAGNOSIS — R1310 Dysphagia, unspecified: Secondary | ICD-10-CM | POA: Insufficient documentation

## 2016-08-12 DIAGNOSIS — K449 Diaphragmatic hernia without obstruction or gangrene: Secondary | ICD-10-CM | POA: Insufficient documentation

## 2016-08-25 DIAGNOSIS — E119 Type 2 diabetes mellitus without complications: Secondary | ICD-10-CM | POA: Diagnosis not present

## 2016-08-25 DIAGNOSIS — Z79899 Other long term (current) drug therapy: Secondary | ICD-10-CM | POA: Diagnosis not present

## 2016-08-25 DIAGNOSIS — M3501 Sicca syndrome with keratoconjunctivitis: Secondary | ICD-10-CM | POA: Diagnosis not present

## 2016-09-01 DIAGNOSIS — Z23 Encounter for immunization: Secondary | ICD-10-CM | POA: Diagnosis not present

## 2016-09-14 DIAGNOSIS — F3181 Bipolar II disorder: Secondary | ICD-10-CM | POA: Diagnosis not present

## 2016-09-24 DIAGNOSIS — K219 Gastro-esophageal reflux disease without esophagitis: Secondary | ICD-10-CM | POA: Diagnosis not present

## 2016-09-24 DIAGNOSIS — R1013 Epigastric pain: Secondary | ICD-10-CM | POA: Diagnosis not present

## 2016-09-24 DIAGNOSIS — R140 Abdominal distension (gaseous): Secondary | ICD-10-CM | POA: Diagnosis not present

## 2016-09-24 DIAGNOSIS — R1310 Dysphagia, unspecified: Secondary | ICD-10-CM | POA: Diagnosis not present

## 2016-10-12 DIAGNOSIS — G479 Sleep disorder, unspecified: Secondary | ICD-10-CM | POA: Diagnosis not present

## 2016-10-12 DIAGNOSIS — F3181 Bipolar II disorder: Secondary | ICD-10-CM | POA: Diagnosis not present

## 2016-10-15 DIAGNOSIS — M25551 Pain in right hip: Secondary | ICD-10-CM | POA: Diagnosis not present

## 2016-10-15 DIAGNOSIS — M25561 Pain in right knee: Secondary | ICD-10-CM | POA: Diagnosis not present

## 2016-10-19 DIAGNOSIS — M351 Other overlap syndromes: Secondary | ICD-10-CM | POA: Diagnosis not present

## 2016-10-19 DIAGNOSIS — Z6834 Body mass index (BMI) 34.0-34.9, adult: Secondary | ICD-10-CM | POA: Diagnosis not present

## 2016-10-19 DIAGNOSIS — Z0000 Encounter for general adult medical examination without abnormal findings: Secondary | ICD-10-CM | POA: Diagnosis not present

## 2016-10-19 DIAGNOSIS — Z1231 Encounter for screening mammogram for malignant neoplasm of breast: Secondary | ICD-10-CM | POA: Diagnosis not present

## 2016-10-19 DIAGNOSIS — Z23 Encounter for immunization: Secondary | ICD-10-CM | POA: Diagnosis not present

## 2016-10-19 DIAGNOSIS — Z0001 Encounter for general adult medical examination with abnormal findings: Secondary | ICD-10-CM | POA: Diagnosis not present

## 2016-10-19 DIAGNOSIS — E559 Vitamin D deficiency, unspecified: Secondary | ICD-10-CM | POA: Diagnosis not present

## 2016-10-19 DIAGNOSIS — M810 Age-related osteoporosis without current pathological fracture: Secondary | ICD-10-CM | POA: Diagnosis not present

## 2016-10-19 DIAGNOSIS — G894 Chronic pain syndrome: Secondary | ICD-10-CM | POA: Diagnosis not present

## 2016-10-19 DIAGNOSIS — T1490XA Injury, unspecified, initial encounter: Secondary | ICD-10-CM | POA: Diagnosis not present

## 2016-10-19 DIAGNOSIS — E039 Hypothyroidism, unspecified: Secondary | ICD-10-CM | POA: Diagnosis not present

## 2016-10-26 ENCOUNTER — Other Ambulatory Visit: Payer: Self-pay | Admitting: Family Medicine

## 2016-10-26 DIAGNOSIS — Z1231 Encounter for screening mammogram for malignant neoplasm of breast: Secondary | ICD-10-CM

## 2016-10-31 ENCOUNTER — Emergency Department
Admission: EM | Admit: 2016-10-31 | Discharge: 2016-10-31 | Disposition: A | Discharge status: Home / Self Care | Payer: Medicare Other | Attending: Student in an Organized Health Care Education/Training Program | Admitting: Student in an Organized Health Care Education/Training Program

## 2016-10-31 ENCOUNTER — Encounter: Payer: Self-pay | Admitting: Emergency Medicine

## 2016-10-31 DIAGNOSIS — M2550 Pain in unspecified joint: Secondary | ICD-10-CM | POA: Diagnosis not present

## 2016-10-31 DIAGNOSIS — E039 Hypothyroidism, unspecified: Secondary | ICD-10-CM | POA: Diagnosis not present

## 2016-10-31 DIAGNOSIS — L930 Discoid lupus erythematosus: Secondary | ICD-10-CM | POA: Insufficient documentation

## 2016-10-31 DIAGNOSIS — E119 Type 2 diabetes mellitus without complications: Secondary | ICD-10-CM | POA: Insufficient documentation

## 2016-10-31 DIAGNOSIS — J45909 Unspecified asthma, uncomplicated: Secondary | ICD-10-CM | POA: Diagnosis not present

## 2016-10-31 DIAGNOSIS — Z79899 Other long term (current) drug therapy: Secondary | ICD-10-CM | POA: Insufficient documentation

## 2016-10-31 LAB — BASIC METABOLIC PANEL
Anion gap: 6 (ref 5–15)
BUN: 23 mg/dL — AB (ref 6–20)
CALCIUM: 9 mg/dL (ref 8.9–10.3)
CHLORIDE: 110 mmol/L (ref 101–111)
CO2: 26 mmol/L (ref 22–32)
CREATININE: 0.92 mg/dL (ref 0.44–1.00)
GFR calc Af Amer: >60 mL/min (ref >60)
GFR calc non Af Amer: >60 mL/min (ref >60)
Glucose, Bld: 96 mg/dL (ref 65–99)
Potassium: 3.8 mmol/L (ref 3.5–5.1)
SODIUM: 142 mmol/L (ref 135–145)

## 2016-10-31 LAB — CBC
HCT: 38.8 % (ref 35.0–47.0)
Hemoglobin: 12.6 g/dL (ref 12.0–16.0)
MCH: 28.2 pg (ref 26.0–34.0)
MCHC: 32.5 g/dL (ref 32.0–36.0)
MCV: 87 fL (ref 80.0–100.0)
PLATELETS: 183 10*3/uL (ref 150–440)
RBC: 4.46 MIL/uL (ref 3.80–5.20)
RDW: 15.6 % — AB (ref 11.5–14.5)
WBC: 6.4 10*3/uL (ref 3.6–11.0)

## 2016-10-31 MED ORDER — KETAMINE HCL 10 MG/ML IJ SOLN
0.2000 mg/kg | Freq: Once | INTRAMUSCULAR | Status: AC
  Administered 2016-10-31: 20 mg via INTRAMUSCULAR
  Filled 2016-10-31: qty 2

## 2016-10-31 MED ORDER — OXYCODONE-ACETAMINOPHEN 5-325 MG PO TABS
1.0000 | ORAL_TABLET | Freq: Once | ORAL | Status: AC
  Administered 2016-10-31: 1 via ORAL
  Filled 2016-10-31: qty 1

## 2016-10-31 MED ORDER — PREDNISONE 20 MG PO TABS: 40.0000 mg | ORAL_TABLET | Freq: Every day | ORAL | 0 refills | Status: AC

## 2016-10-31 MED ORDER — MORPHINE SULFATE (PF) 4 MG/ML IV SOLN
8.0000 mg | Freq: Once | INTRAVENOUS | Status: AC
  Administered 2016-10-31: 8 mg via INTRAMUSCULAR

## 2016-10-31 MED ORDER — MORPHINE SULFATE (PF) 10 MG/ML IV SOLN
INTRAVENOUS | Status: AC
  Filled 2016-10-31: qty 1

## 2016-10-31 MED ORDER — PREDNISONE 20 MG PO TABS
60.0000 mg | ORAL_TABLET | Freq: Once | ORAL | Status: AC
  Administered 2016-10-31: 60 mg via ORAL
  Filled 2016-10-31: qty 3

## 2016-10-31 NOTE — ED Notes (Signed)
Pt waiting patiently in triage for blood draw; rates pain 10/10; says she took a Percocet at home at 4pm; verbal order to medicate pt with Percocet at this time

## 2016-10-31 NOTE — ED Provider Notes (Signed)
[facility] Emergency Department Provider Note    First MD Initiated Contact with Patient 10/31/16 2100     (approximate)  I have reviewed the triage vital signs and the nursing notes.   HISTORY  Chief Complaint Lupus and Joint Pain    HPI [name] [name] is a 50 y.o. female with a history of anxiety, depression, lupus as well as fibromyalgia presents with diffuse arthralgias that started this morning. Patient is on chronic steroids as well as a fentanyl patch and Percocet. Patient denies any fevers. No nausea or vomiting or chest pain. No diarrhea. States that this is similar to previous lupus flares. No trauma. Currently rates the pain as 10 out of 10 in severity without radiation.   Past Medical History:  Diagnosis Date  . ANXIETY 06/06/2007  . Asthma   . BACK PAIN 02/22/2009  . Cervical cancer ([place])   . DEPRESSION 06/06/2007  . Diabetes mellitus without complication ([place])   . FREQUENCY, URINARY 07/07/2007  . GERD 06/06/2007  . HYPOTHYROIDISM 06/06/2007  . INTERSTITIAL CYSTITIS 09/11/2010  . Lupus 2006  . Lupus   . NEPHROLITHIASIS, HX OF 11/05/2010  . OSTEOPENIA 10/14/2009  . OVERACTIVE BLADDER 10/14/2009  . PAIN, CHRONIC NEC 06/06/2007  . POLYARTHRITIS 08/19/2007  . 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 WEIGHT GAIN 02/22/2009   Family History  Problem Relation Age of Onset  . Hypertension Mother   . Arthritis Mother   . Asthma Sister   . Asthma Daughter   . Pancreatic cancer Father   . Diabetes Maternal Grandmother   . Heart disease Maternal Grandmother   . Colon cancer Maternal Uncle 23  . Crohn's disease Maternal Aunt   . Diabetes Maternal Aunt    Past Surgical History:  Procedure Laterality Date  . APPENDECTOMY    . [place] ARTHROPLASTY  06-2009  . TONSILLECTOMY    . WRIST RECONSTRUCTION     Patient Active Problem List   Diagnosis Date Noted  . Sicca syndrome ([place]) 06/04/2014  . Abdominal pain, epigastric 09/08/2013    . Type II or unspecified type diabetes mellitus without mention of complication, uncontrolled 03/15/2013  . UTI (urinary tract infection) 02/15/2013  . Chronic pain 03/31/2011  . NEPHROLITHIASIS, HX OF 11/05/2010  . INTERSTITIAL CYSTITIS 09/11/2010  . OVERACTIVE BLADDER 10/14/2009  . OSTEOPENIA 10/14/2009  . BACK PAIN 02/22/2009  . WEIGHT GAIN 02/22/2009  . 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 POLYARTHRITIS 08/19/2007  . FREQUENCY, URINARY 07/07/2007  . HYPOTHYROIDISM 06/06/2007  . ANXIETY 06/06/2007  . DEPRESSION 06/06/2007  . PAIN, CHRONIC NEC 06/06/2007  . GERD 06/06/2007      Prior to Admission medications   Medication Sig Start Date End Date Taking? Authorizing Provider  ABILIFY 15 MG tablet Take 15 mg by mouth daily.  09/18/13   Historical Provider, MD  albuterol (PROVENTIL HFA) 108 (90 BASE) MCG/ACT inhaler Inhale 2 puffs into the lungs every 4 (four) hours as needed for wheezing or shortness of breath.     Historical Provider, MD  Ascorbic Acid (VITAMIN C GUMMIE PO) Take 4 tablets by mouth every morning.    Historical Provider, MD  aspirin 81 MG tablet Take 81 mg by mouth daily.    Historical Provider, MD  azaTHIOprine (IMURAN) 50 MG tablet Take 100 mg by mouth daily. 09/27/13   Marletta Lor, MD  Calcium-Phosphorus-Vitamin D (CALCIUM GUMMIES PO) Take 2 tablets by mouth every morning.    Historical Provider, MD  celecoxib (CELEBREX)  200 MG capsule  08/27/14   Historical Provider, MD  Cholecalciferol (VITAMIN D) 2000 UNITS CAPS Take 2,000 Units by mouth every morning.     Historical Provider, MD  dapsone 25 MG tablet Take 25 mg by mouth every morning.     Historical Provider, MD  diclofenac sodium (VOLTAREN) 1 % GEL Apply 2 g topically 4 (four) times daily.    Historical Provider, MD  diphenhydrAMINE (BENYLIN) 12.5 MG/5ML syrup Take 10 mLs (25 mg total) by mouth 4 (four) times daily as needed for allergies. Mixed with 5 mL of viscous lidocaine for swish and swallow.  11/13/15   Sable Feil, PA-C  DULoxetine (CYMBALTA) 60 MG capsule Take 60 mg by mouth 2 (two) times daily.    Historical Provider, MD  fentaNYL (DURAGESIC - DOSED MCG/HR) 75 MCG/HR Place 75 mcg onto the skin every 3 (three) days.    Historical Provider, MD  folic acid (FOLVITE) 1 MG tablet Take 1 mg by mouth daily.    Historical Provider, MD  HYDROcodone-acetaminophen (NORCO/VICODIN) 5-325 MG tablet Take 1 tablet by mouth every 4 (four) hours as needed for moderate pain. 09/16/15   Harvest Dark, MD  levothyroxine (SYNTHROID, LEVOTHROID) 25 MCG tablet Take 1 tablet (25 mcg total) by mouth daily before breakfast. 03/15/14   Marletta Lor, MD  lidocaine (XYLOCAINE) 2 % solution Use as directed 5 mLs in the mouth or throat every 6 (six) hours as needed for mouth pain. Makes with Benadryl elixir swish and swallow 11/13/15   Sable Feil, PA-C  LORazepam (ATIVAN) 1 MG tablet Take 1-2 mg by mouth 3 (three) times daily. 1mg  am and afternoon and 2 mg at bedtime    Historical Provider, MD  metFORMIN (GLUCOPHAGE-XR) 500 MG 24 hr tablet Take 1 tablet (500 mg total) by mouth 2 (two) times daily. 06/04/14   Marletta Lor, MD  methotrexate 25 MG/ML injection Inject 25 mg into the skin once a week. On saturdays 08/23/13   Historical Provider, MD  methylphenidate (CONCERTA) 27 MG CR tablet Take 27 mg by mouth every morning.    Historical Provider, MD  Multiple Vitamins-Minerals (HM MULTIVITAMIN ADULT GUMMY PO) Take 2 tablets by mouth daily.    Historical Provider, MD  ondansetron (ZOFRAN-ODT) 4 MG disintegrating tablet  07/03/14   Historical Provider, MD  oxyCODONE-acetaminophen (PERCOCET) 10-325 MG per tablet Take 1 tablet by mouth every 6 (six) hours as needed for pain.     Historical Provider, MD  pantoprazole (PROTONIX) 40 MG tablet Take one tablet by mouth twice daily 12/18/13   Marletta Lor, MD  predniSONE (DELTASONE) 10 MG tablet Take 10 mg by mouth daily.     Historical Provider, MD    pregabalin (LYRICA) 50 MG capsule Take 1 capsule (50 mg total) by mouth at bedtime. 09/18/14   Alda Berthold, DO  Pyridoxal-5 Phosphate POWD Take by mouth. 09/24/10   Historical Provider, MD  topiramate (TOPAMAX) 100 MG tablet Take one tablet by mouth twice daily 03/15/14   Marletta Lor, MD  traMADol (ULTRAM) 50 MG tablet Take 1 tablet (50 mg total) by mouth every 8 (eight) hours as needed. 07/09/14   Marletta Lor, MD  triamcinolone cream (KENALOG) 0.1 % Apply 1 application topically 2 (two) times daily. 06/04/14   Marletta Lor, MD  valACYclovir (VALTREX) 500 MG tablet Take 500 mg by mouth at bedtime.  08/26/12   Historical Provider, MD    Allergies Latex; Clarithromycin; Compazine [prochlorperazine edisylate];  Dhea [nutritional supplements]; Fish allergy; Prasterone; Promethazine hcl; Hydromorphone hcl; and Penicillins    Social History Social History  Substance Use Topics  . Smoking status: Never Smoker  . Smokeless tobacco: Never Used  . Alcohol use No    Review of Systems Patient denies headaches, rhinorrhea, blurry vision, numbness, shortness of breath, chest pain, edema, cough, abdominal pain, nausea, vomiting, diarrhea, dysuria, fevers, rashes or hallucinations unless otherwise stated above in HPI. ____________________________________________   PHYSICAL EXAM:  VITAL SIGNS: Vitals:   10/31/16 1855 10/31/16 2129  BP: 130/66 (!) 105/54  Pulse:  77  Resp: 18 16  Temp: 98.2 F (36.8 C)     Constitutional: Alert and oriented. Diffusely shaking (states this is baseline for lupus flares) but in no acute distress. Eyes: Conjunctivae are normal. PERRL. EOMI. Head: Atraumatic. Nose: No congestion/rhinnorhea. Mouth/Throat: Mucous membranes are moist.  Oropharynx non-erythematous. Neck: No stridor. Painless ROM. No cervical spine tenderness to palpation Hematological/Lymphatic/Immunilogical: No cervical lymphadenopathy. Cardiovascular: Normal rate, regular  rhythm. Grossly normal heart sounds.  Good peripheral circulation. Respiratory: Normal respiratory effort.  No retractions. Lungs CTAB. Gastrointestinal: Soft and nontender. No distention. No abdominal bruits. No CVA tenderness. Musculoskeletal: No lower extremity tenderness nor edema.  No joint effusions., no erythema or pain with passive rom Neurologic:  Normal speech and language. No gross focal neurologic deficits are appreciated. No gait instability. Skin:  Skin is warm, dry and intact. No rash noted. Psychiatric: Mood and affect are normal. Speech and behavior are normal.  ____________________________________________   LABS (all labs ordered are listed, but only abnormal results are displayed)  Results for orders placed or performed during the hospital encounter of 10/31/16 (from the past 24 hour(s))  Basic metabolic panel     Status: Abnormal   Collection Time: 10/31/16  7:49 PM  Result Value Ref Range   Sodium 142 135 - 145 mmol/L   Potassium 3.8 3.5 - 5.1 mmol/L   Chloride 110 101 - 111 mmol/L   CO2 26 22 - 32 mmol/L   Glucose, Bld 96 65 - 99 mg/dL   BUN 23 (H) 6 - 20 mg/dL   Creatinine, Ser 0.92 0.44 - 1.00 mg/dL   Calcium 9.0 8.9 - 10.3 mg/dL   GFR calc non Af Amer >60 >60 mL/min   GFR calc Af Amer >60 >60 mL/min   Anion gap 6 5 - 15  CBC     Status: Abnormal   Collection Time: 10/31/16  7:49 PM  Result Value Ref Range   WBC 6.4 3.6 - 11.0 K/uL   RBC 4.46 3.80 - 5.20 MIL/uL   Hemoglobin 12.6 12.0 - 16.0 g/dL   HCT 38.8 35.0 - 47.0 %   MCV 87.0 80.0 - 100.0 fL   MCH 28.2 26.0 - 34.0 pg   MCHC 32.5 32.0 - 36.0 g/dL   RDW 15.6 (H) 11.5 - 14.5 %   Platelets 183 150 - 440 K/uL   ____________________________________________  EKG ____________________________________________  RADIOLOGY   ____________________________________________   PROCEDURES  Procedure(s) performed: none Procedures    Critical Care performed:  no ____________________________________________   INITIAL IMPRESSION / ASSESSMENT AND PLAN / ED COURSE  Pertinent labs & imaging results that were available during my care of the patient were reviewed by me and considered in my medical decision making (see chart for details).  DDX: lupus flare, dehydration, myalgia, ra, oa  [name] [name] is a 50 y.o. who presents to the ED with Lupus presents with diffuse arthritic pain consistent  with Lupus flare that started this morning. Patient arrives afebrile hemodynamically stable. Blood work ordered to evaluate for any acute abnormality is reassuring. Do not feel radiographic imaging clinically indicated at this time as this is consistent with her previous bouts of pain. We will give burst dose of steroids as well as IM pain medication.  Clinical Course as of Nov 01 12  Sat Oct 31, 2016  2251 Patient reports complete resolution of symptoms after IM pain medication. Has received oral prednisone. Will discharge with prednisone taper.  Have discussed with the patient and available family all diagnostics and treatments performed thus far and all questions were answered to the best of my ability. The patient demonstrates understanding and agreement with plan.   [PR]    Clinical Course User Index [PR] Merlyn Lot, MD     ____________________________________________   FINAL CLINICAL IMPRESSION(S) / ED DIAGNOSES  Final diagnoses:  Arthralgia, unspecified joint  Lupus erythematosus, unspecified form      NEW MEDICATIONS STARTED DURING THIS VISIT:  New Prescriptions   No medications on file     Note:  This document was prepared using Dragon voice recognition software and may include unintentional dictation errors.    Merlyn Lot, MD 11/01/16 [phone]

## 2016-10-31 NOTE — ED Notes (Signed)
Unable to waste the ketamine in the pyxis - doesn't request it. 79ml out of 60ml given to pt thus 36ml wasted and witnessed by [facility]. Pharmacy called and informed.

## 2016-10-31 NOTE — ED Triage Notes (Signed)
Pt presents to ED with c/o Lupus flare up that started this morning. Pt states she recently had the flu 2 weeks ago and has been doing more housework today than usual and by this afternoon pain was severe unrelieved with Fentanyl 81mcg patch and Percocet 5/235 which she took at 1600.  Pt only takes percocet on days when she has a flare-up. Pt state her pain in bilateral wrists and knees is a 10/10.  Alert and oriented x 4.

## 2016-11-03 DIAGNOSIS — K2990 Gastroduodenitis, unspecified, without bleeding: Secondary | ICD-10-CM | POA: Diagnosis not present

## 2016-11-03 DIAGNOSIS — R1013 Epigastric pain: Secondary | ICD-10-CM | POA: Diagnosis not present

## 2016-11-03 DIAGNOSIS — K2970 Gastritis, unspecified, without bleeding: Secondary | ICD-10-CM | POA: Diagnosis not present

## 2016-11-03 DIAGNOSIS — K219 Gastro-esophageal reflux disease without esophagitis: Secondary | ICD-10-CM | POA: Diagnosis not present

## 2016-11-03 DIAGNOSIS — K2950 Unspecified chronic gastritis without bleeding: Secondary | ICD-10-CM | POA: Diagnosis not present

## 2016-11-03 DIAGNOSIS — R1310 Dysphagia, unspecified: Secondary | ICD-10-CM | POA: Diagnosis not present

## 2016-11-03 DIAGNOSIS — K449 Diaphragmatic hernia without obstruction or gangrene: Secondary | ICD-10-CM | POA: Diagnosis not present

## 2016-11-03 DIAGNOSIS — K2960 Other gastritis without bleeding: Secondary | ICD-10-CM | POA: Diagnosis not present

## 2016-11-05 DIAGNOSIS — M25532 Pain in left wrist: Secondary | ICD-10-CM | POA: Diagnosis not present

## 2016-11-09 ENCOUNTER — Other Ambulatory Visit: Payer: Self-pay | Admitting: Orthopedic Surgery

## 2016-11-09 DIAGNOSIS — M25562 Pain in left knee: Secondary | ICD-10-CM | POA: Diagnosis not present

## 2016-11-09 DIAGNOSIS — M25551 Pain in right hip: Secondary | ICD-10-CM | POA: Diagnosis not present

## 2016-11-09 DIAGNOSIS — Z8781 Personal history of (healed) traumatic fracture: Secondary | ICD-10-CM

## 2016-11-09 DIAGNOSIS — Z967 Presence of other bone and tendon implants: Secondary | ICD-10-CM | POA: Diagnosis not present

## 2016-11-09 DIAGNOSIS — Z9889 Other specified postprocedural states: Secondary | ICD-10-CM

## 2016-11-09 DIAGNOSIS — M25462 Effusion, left knee: Secondary | ICD-10-CM | POA: Diagnosis not present

## 2016-11-10 ENCOUNTER — Other Ambulatory Visit: Payer: Self-pay | Admitting: Orthopedic Surgery

## 2016-11-10 ENCOUNTER — Encounter
Admission: RE | Admit: 2016-11-10 | Discharge: 2016-11-10 | Disposition: A | Discharge status: Home / Self Care | Payer: Medicare Other | Source: Ambulatory Visit | Attending: Orthopedic Surgery | Admitting: Orthopedic Surgery

## 2016-11-10 DIAGNOSIS — M25551 Pain in right hip: Secondary | ICD-10-CM | POA: Diagnosis not present

## 2016-11-10 DIAGNOSIS — Z9889 Other specified postprocedural states: Secondary | ICD-10-CM

## 2016-11-10 DIAGNOSIS — M25462 Effusion, left knee: Secondary | ICD-10-CM

## 2016-11-10 DIAGNOSIS — Z8781 Personal history of (healed) traumatic fracture: Secondary | ICD-10-CM

## 2016-11-10 DIAGNOSIS — R52 Pain, unspecified: Secondary | ICD-10-CM | POA: Diagnosis not present

## 2016-11-10 DIAGNOSIS — T1490XA Injury, unspecified, initial encounter: Secondary | ICD-10-CM | POA: Diagnosis not present

## 2016-11-10 DIAGNOSIS — Z967 Presence of other bone and tendon implants: Secondary | ICD-10-CM | POA: Insufficient documentation

## 2016-11-10 DIAGNOSIS — M25562 Pain in left knee: Secondary | ICD-10-CM

## 2016-11-10 MED ORDER — TECHNETIUM TC 99M MEDRONATE IV KIT
25.0000 | PACK | Freq: Once | INTRAVENOUS | Status: AC | PRN
  Administered 2016-11-10: 23.54 via INTRAVENOUS

## 2016-11-17 DIAGNOSIS — R251 Tremor, unspecified: Secondary | ICD-10-CM | POA: Diagnosis not present

## 2016-11-17 DIAGNOSIS — G43119 Migraine with aura, intractable, without status migrainosus: Secondary | ICD-10-CM | POA: Diagnosis not present

## 2016-11-17 DIAGNOSIS — R200 Anesthesia of skin: Secondary | ICD-10-CM | POA: Diagnosis not present

## 2016-11-24 ENCOUNTER — Ambulatory Visit: Payer: Medicare Other

## 2016-11-24 DIAGNOSIS — M3500 Sicca syndrome, unspecified: Secondary | ICD-10-CM | POA: Diagnosis not present

## 2016-11-24 DIAGNOSIS — E038 Other specified hypothyroidism: Secondary | ICD-10-CM | POA: Diagnosis not present

## 2016-11-24 DIAGNOSIS — Z5181 Encounter for therapeutic drug level monitoring: Secondary | ICD-10-CM | POA: Diagnosis not present

## 2016-11-24 DIAGNOSIS — D8989 Other specified disorders involving the immune mechanism, not elsewhere classified: Secondary | ICD-10-CM | POA: Diagnosis not present

## 2016-11-24 DIAGNOSIS — Z9229 Personal history of other drug therapy: Secondary | ICD-10-CM | POA: Diagnosis not present

## 2016-11-24 DIAGNOSIS — M8589 Other specified disorders of bone density and structure, multiple sites: Secondary | ICD-10-CM | POA: Diagnosis not present

## 2016-11-27 DIAGNOSIS — Z967 Presence of other bone and tendon implants: Secondary | ICD-10-CM | POA: Diagnosis not present

## 2016-11-27 DIAGNOSIS — M25551 Pain in right hip: Secondary | ICD-10-CM | POA: Diagnosis not present

## 2016-11-27 DIAGNOSIS — Z8781 Personal history of (healed) traumatic fracture: Secondary | ICD-10-CM | POA: Diagnosis not present

## 2016-12-02 ENCOUNTER — Ambulatory Visit: Payer: Medicare Other

## 2016-12-07 ENCOUNTER — Ambulatory Visit: Payer: Medicare Other

## 2016-12-14 ENCOUNTER — Ambulatory Visit
Admission: RE | Admit: 2016-12-14 | Discharge: 2016-12-14 | Disposition: A | Discharge status: Home / Self Care | Payer: Medicare Other | Source: Ambulatory Visit | Attending: Orthopedic Surgery | Admitting: Orthopedic Surgery

## 2016-12-14 DIAGNOSIS — M94262 Chondromalacia, left knee: Secondary | ICD-10-CM | POA: Diagnosis not present

## 2016-12-14 DIAGNOSIS — M25462 Effusion, left knee: Secondary | ICD-10-CM | POA: Diagnosis not present

## 2016-12-14 DIAGNOSIS — M25562 Pain in left knee: Secondary | ICD-10-CM

## 2016-12-18 DIAGNOSIS — M1611 Unilateral primary osteoarthritis, right hip: Secondary | ICD-10-CM | POA: Diagnosis not present

## 2016-12-23 DIAGNOSIS — M25462 Effusion, left knee: Secondary | ICD-10-CM | POA: Diagnosis not present

## 2016-12-23 DIAGNOSIS — M222X2 Patellofemoral disorders, left knee: Secondary | ICD-10-CM | POA: Diagnosis not present

## 2016-12-31 DIAGNOSIS — M25532 Pain in left wrist: Secondary | ICD-10-CM | POA: Diagnosis not present

## 2016-12-31 DIAGNOSIS — F3181 Bipolar II disorder: Secondary | ICD-10-CM | POA: Diagnosis not present

## 2017-01-08 DIAGNOSIS — M222X2 Patellofemoral disorders, left knee: Secondary | ICD-10-CM | POA: Diagnosis not present

## 2017-01-08 DIAGNOSIS — M6281 Muscle weakness (generalized): Secondary | ICD-10-CM | POA: Diagnosis not present

## 2017-01-08 DIAGNOSIS — R29818 Other symptoms and signs involving the nervous system: Secondary | ICD-10-CM | POA: Diagnosis not present

## 2017-01-08 DIAGNOSIS — M25562 Pain in left knee: Secondary | ICD-10-CM | POA: Diagnosis not present

## 2017-01-12 DIAGNOSIS — G479 Sleep disorder, unspecified: Secondary | ICD-10-CM | POA: Diagnosis not present

## 2017-01-12 DIAGNOSIS — F3181 Bipolar II disorder: Secondary | ICD-10-CM | POA: Diagnosis not present

## 2017-01-13 DIAGNOSIS — M6281 Muscle weakness (generalized): Secondary | ICD-10-CM | POA: Diagnosis not present

## 2017-01-13 DIAGNOSIS — M25562 Pain in left knee: Secondary | ICD-10-CM | POA: Diagnosis not present

## 2017-01-13 DIAGNOSIS — M222X2 Patellofemoral disorders, left knee: Secondary | ICD-10-CM | POA: Diagnosis not present

## 2017-01-13 DIAGNOSIS — R29818 Other symptoms and signs involving the nervous system: Secondary | ICD-10-CM | POA: Diagnosis not present

## 2017-01-18 DIAGNOSIS — M222X2 Patellofemoral disorders, left knee: Secondary | ICD-10-CM | POA: Diagnosis not present

## 2017-01-18 DIAGNOSIS — M25562 Pain in left knee: Secondary | ICD-10-CM | POA: Diagnosis not present

## 2017-01-18 DIAGNOSIS — R29818 Other symptoms and signs involving the nervous system: Secondary | ICD-10-CM | POA: Diagnosis not present

## 2017-01-18 DIAGNOSIS — M6281 Muscle weakness (generalized): Secondary | ICD-10-CM | POA: Diagnosis not present

## 2017-01-20 DIAGNOSIS — M222X2 Patellofemoral disorders, left knee: Secondary | ICD-10-CM | POA: Diagnosis not present

## 2017-01-20 DIAGNOSIS — Z967 Presence of other bone and tendon implants: Secondary | ICD-10-CM | POA: Diagnosis not present

## 2017-01-20 DIAGNOSIS — Z8781 Personal history of (healed) traumatic fracture: Secondary | ICD-10-CM | POA: Diagnosis not present

## 2017-01-20 DIAGNOSIS — T8484XA Pain due to internal orthopedic prosthetic devices, implants and grafts, initial encounter: Secondary | ICD-10-CM | POA: Diagnosis not present

## 2017-01-25 DIAGNOSIS — R300 Dysuria: Secondary | ICD-10-CM | POA: Diagnosis not present

## 2017-01-26 ENCOUNTER — Other Ambulatory Visit: Payer: Self-pay | Admitting: Obstetrics and Gynecology

## 2017-01-26 DIAGNOSIS — Z1231 Encounter for screening mammogram for malignant neoplasm of breast: Secondary | ICD-10-CM

## 2017-01-27 DIAGNOSIS — F3181 Bipolar II disorder: Secondary | ICD-10-CM | POA: Diagnosis not present

## 2017-02-03 ENCOUNTER — Inpatient Hospital Stay: Admission: RE | Admit: 2017-02-03 | Payer: Medicare Other | Source: Ambulatory Visit

## 2017-02-10 ENCOUNTER — Encounter
Admission: RE | Admit: 2017-02-10 | Discharge: 2017-02-10 | Disposition: A | Discharge status: Home / Self Care | Payer: Medicare Other | Source: Ambulatory Visit | Attending: Orthopedic Surgery | Admitting: Orthopedic Surgery

## 2017-02-10 DIAGNOSIS — Z01812 Encounter for preprocedural laboratory examination: Secondary | ICD-10-CM | POA: Insufficient documentation

## 2017-02-10 DIAGNOSIS — R9431 Abnormal electrocardiogram [ECG] [EKG]: Secondary | ICD-10-CM | POA: Diagnosis not present

## 2017-02-10 DIAGNOSIS — Z01818 Encounter for other preprocedural examination: Secondary | ICD-10-CM | POA: Insufficient documentation

## 2017-02-10 HISTORY — DX: Essential tremor: G25.0

## 2017-02-10 HISTORY — DX: Headache, unspecified: R51.9

## 2017-02-10 HISTORY — DX: Personal history of other diseases of the digestive system: Z87.19

## 2017-02-10 HISTORY — DX: Fibromyalgia: M79.7

## 2017-02-10 HISTORY — DX: Personal history of urinary calculi: Z87.442

## 2017-02-10 HISTORY — DX: Headache: R51

## 2017-02-10 HISTORY — DX: Polyneuropathy, unspecified: G62.9

## 2017-02-10 HISTORY — DX: Gastritis, unspecified, without bleeding: K29.70

## 2017-02-10 LAB — BASIC METABOLIC PANEL
Anion gap: 8 (ref 5–15)
BUN: 15 mg/dL (ref 6–20)
CALCIUM: 8.9 mg/dL (ref 8.9–10.3)
CHLORIDE: 106 mmol/L (ref 101–111)
CO2: 27 mmol/L (ref 22–32)
CREATININE: 0.86 mg/dL (ref 0.44–1.00)
GFR calc non Af Amer: >60 mL/min (ref >60)
GLUCOSE: 88 mg/dL (ref 65–99)
Potassium: 4.7 mmol/L (ref 3.5–5.1)
Sodium: 141 mmol/L (ref 135–145)

## 2017-02-10 LAB — CBC
HEMATOCRIT: 39.9 % (ref 35.0–47.0)
HEMOGLOBIN: 12.7 g/dL (ref 12.0–16.0)
MCH: 27.5 pg (ref 26.0–34.0)
MCHC: 31.8 g/dL — AB (ref 32.0–36.0)
MCV: 86.6 fL (ref 80.0–100.0)
Platelets: 152 10*3/uL (ref 150–440)
RBC: 4.6 MIL/uL (ref 3.80–5.20)
RDW: 18 % — AB (ref 11.5–14.5)
WBC: 8.1 10*3/uL (ref 3.6–11.0)

## 2017-02-10 LAB — SURGICAL PCR SCREEN
MRSA, PCR: NEGATIVE
STAPHYLOCOCCUS AUREUS: NEGATIVE

## 2017-02-10 NOTE — Patient Instructions (Addendum)
Your procedure is scheduled on: February 16, 2017 (Tuesday) Report to Same Day Surgery 2nd floor medical mall [facility] Entrance-take elevator on left to 2nd floor.  Check in with surgery information desk.) To find out your arrival time please call [phone] between 1PM - 3PM on  February 15, 2017 (Monday)  Remember: Instructions that are not followed completely may result in serious medical risk, up to and including death, or upon the discretion of your surgeon and anesthesiologist your surgery may need to be rescheduled.    _x___ 1. Do not eat food or drink liquids after midnight. No gum chewing or                              hard candies.     __x__ 2. No Alcohol for 24 hours before or after surgery.   __x__3. No Smoking for 24 prior to surgery.   ____  4. Bring all medications with you on the day of surgery if instructed.    __x__ 5. Notify your doctor if there is any change in your medical condition     (cold, fever, infections).     Do not wear jewelry, make-up, hairpins, clips or nail polish.  Do not wear lotions, powders, or perfumes. You may wear deodorant.  Do not shave 48 hours prior to surgery. Men may shave face and neck.  Do not bring valuables to the hospital.    [facility] is not responsible for any belongings or valuables.               Contacts, dentures or bridgework may not be worn into surgery.  Leave your suitcase in the car. After surgery it may be brought to your room.  For patients admitted to the hospital, discharge time is determined by your                       treatment team.   Patients discharged the day of surgery will not be allowed to drive home.  You will need someone to drive you home and stay with you the night of your procedure.    Please read over the following fact sheets that you were given:   [facility] Preparing for Surgery and or MRSA Information   _x___ Take anti-hypertensive (unless it includes a diuretic), cardiac, seizure, asthma,      anti-reflux and psychiatric medicines. These include:    1. Prednisone  2. Pantoprazole (Pantoprazole at bedtime on Monday night 3/19)  3. Cymbalta  4. Dapsone  5. Abilify             6 Levothyroxine             7.Singulair            8. Topamax     ____Fleets enema or Magnesium Citrate as directed.   _x___ Use CHG Soap or sage wipes as directed on instruction sheet   __x__ Use inhalers on the day of surgery and bring to hospital day of surgery (Use Albuterol inhaler the morning of surgery and bring to hospital)  ____ Stop Metformin and Janumet 2 days prior to surgery.    ____ Take 1/2 of usual insulin dose the night before surgery and none on the morning     surgery.   _x___ Follow recommendations from Cardiologist, Pulmonologist or PCP regarding          stopping Aspirin, Coumadin,  Pllavix ,Eliquis, Effient, or Pradaxa, and Pletal. (STOP ASPIRIN NOW)  X____Stop Anti-inflammatories such as Advil, Aleve, Ibuprofen, Motrin, Naproxen, Naprosyn, Goodies powders or aspirin products. OK to take Tylenol  (STOP LODINE,IBUPROFEN, AND VOLTAREN GEL NOW)   _x___ Stop supplements until after surgery.  But may continue Vitamin D, Vitamin B, and multivitamin. (STOP BIOTIN AND VITAMIN C NOW)       ____ Bring C-Pap to the hospital.

## 2017-02-11 NOTE — Pre-Procedure Instructions (Signed)
EKG COMPARED WITH 2/17 EKG

## 2017-02-15 MED ORDER — VANCOMYCIN HCL 500 MG IV SOLR
500.0000 mg | Freq: Once | INTRAVENOUS | Status: AC
  Administered 2017-02-16: 500 mg via INTRAVENOUS
  Filled 2017-02-15: qty 500

## 2017-02-16 ENCOUNTER — Ambulatory Visit: Payer: Medicare Other | Admitting: Anesthesiology

## 2017-02-16 ENCOUNTER — Ambulatory Visit: Payer: Medicare Other

## 2017-02-16 ENCOUNTER — Ambulatory Visit
Admission: RE | Admit: 2017-02-16 | Discharge: 2017-02-16 | Disposition: A | Discharge status: Home / Self Care | Payer: Medicare Other | Source: Ambulatory Visit | Attending: Orthopedic Surgery | Admitting: Orthopedic Surgery

## 2017-02-16 ENCOUNTER — Encounter: Payer: Self-pay | Admitting: Anesthesiology

## 2017-02-16 ENCOUNTER — Encounter
Admission: RE | Disposition: A | Discharge status: Home / Self Care | Payer: Self-pay | Source: Ambulatory Visit | Attending: Orthopedic Surgery

## 2017-02-16 ENCOUNTER — Ambulatory Visit
Admission: RE | Admit: 2017-02-16 | Discharge: 2017-02-17 | Disposition: A | Discharge status: Home / Self Care | Payer: Medicare Other | Source: Ambulatory Visit | Attending: Orthopedic Surgery | Admitting: Orthopedic Surgery

## 2017-02-16 DIAGNOSIS — Z79891 Long term (current) use of opiate analgesic: Secondary | ICD-10-CM | POA: Diagnosis not present

## 2017-02-16 DIAGNOSIS — Y831 Surgical operation with implant of artificial internal device as the cause of abnormal reaction of the patient, or of later complication, without mention of misadventure at the time of the procedure: Secondary | ICD-10-CM | POA: Diagnosis not present

## 2017-02-16 DIAGNOSIS — Z79899 Other long term (current) drug therapy: Secondary | ICD-10-CM | POA: Insufficient documentation

## 2017-02-16 DIAGNOSIS — M3500 Sicca syndrome, unspecified: Secondary | ICD-10-CM | POA: Insufficient documentation

## 2017-02-16 DIAGNOSIS — M6752 Plica syndrome, left knee: Secondary | ICD-10-CM | POA: Diagnosis not present

## 2017-02-16 DIAGNOSIS — Z96639 Presence of unspecified artificial wrist joint: Secondary | ICD-10-CM | POA: Diagnosis not present

## 2017-02-16 DIAGNOSIS — Z8541 Personal history of malignant neoplasm of cervix uteri: Secondary | ICD-10-CM | POA: Diagnosis not present

## 2017-02-16 DIAGNOSIS — Z7952 Long term (current) use of systemic steroids: Secondary | ICD-10-CM | POA: Insufficient documentation

## 2017-02-16 DIAGNOSIS — Z791 Long term (current) use of non-steroidal anti-inflammatories (NSAID): Secondary | ICD-10-CM | POA: Diagnosis not present

## 2017-02-16 DIAGNOSIS — T8484XA Pain due to internal orthopedic prosthetic devices, implants and grafts, initial encounter: Secondary | ICD-10-CM | POA: Insufficient documentation

## 2017-02-16 DIAGNOSIS — Z419 Encounter for procedure for purposes other than remedying health state, unspecified: Secondary | ICD-10-CM

## 2017-02-16 DIAGNOSIS — M222X2 Patellofemoral disorders, left knee: Secondary | ICD-10-CM | POA: Diagnosis not present

## 2017-02-16 DIAGNOSIS — G43909 Migraine, unspecified, not intractable, without status migrainosus: Secondary | ICD-10-CM | POA: Insufficient documentation

## 2017-02-16 DIAGNOSIS — Z9889 Other specified postprocedural states: Secondary | ICD-10-CM

## 2017-02-16 DIAGNOSIS — J45909 Unspecified asthma, uncomplicated: Secondary | ICD-10-CM | POA: Insufficient documentation

## 2017-02-16 DIAGNOSIS — E119 Type 2 diabetes mellitus without complications: Secondary | ICD-10-CM | POA: Diagnosis not present

## 2017-02-16 DIAGNOSIS — M810 Age-related osteoporosis without current pathological fracture: Secondary | ICD-10-CM | POA: Diagnosis not present

## 2017-02-16 DIAGNOSIS — Z472 Encounter for removal of internal fixation device: Secondary | ICD-10-CM | POA: Diagnosis not present

## 2017-02-16 DIAGNOSIS — I7300 Raynaud's syndrome without gangrene: Secondary | ICD-10-CM | POA: Insufficient documentation

## 2017-02-16 DIAGNOSIS — F419 Anxiety disorder, unspecified: Secondary | ICD-10-CM | POA: Insufficient documentation

## 2017-02-16 DIAGNOSIS — F329 Major depressive disorder, single episode, unspecified: Secondary | ICD-10-CM | POA: Diagnosis not present

## 2017-02-16 DIAGNOSIS — M329 Systemic lupus erythematosus, unspecified: Secondary | ICD-10-CM | POA: Diagnosis not present

## 2017-02-16 DIAGNOSIS — Z7982 Long term (current) use of aspirin: Secondary | ICD-10-CM | POA: Insufficient documentation

## 2017-02-16 DIAGNOSIS — E039 Hypothyroidism, unspecified: Secondary | ICD-10-CM | POA: Diagnosis not present

## 2017-02-16 DIAGNOSIS — K219 Gastro-esophageal reflux disease without esophagitis: Secondary | ICD-10-CM | POA: Diagnosis not present

## 2017-02-16 HISTORY — PX: HARDWARE REMOVAL: SHX979

## 2017-02-16 LAB — GLUCOSE, CAPILLARY: Glucose-Capillary: 136 mg/dL — ABNORMAL HIGH (ref 65–99)

## 2017-02-16 SURGERY — REMOVAL, HARDWARE
Anesthesia: General | Laterality: Right | Wound class: Clean

## 2017-02-16 MED ORDER — PHENYLEPHRINE HCL 10 MG/ML IJ SOLN
INTRAMUSCULAR | Status: DC | PRN
  Administered 2017-02-16 (×2): 100 ug via INTRAVENOUS

## 2017-02-16 MED ORDER — MAGNESIUM HYDROXIDE 400 MG/5ML PO SUSP: 30.0000 mL | Freq: Every day | ORAL | Status: DC | PRN

## 2017-02-16 MED ORDER — ACETAMINOPHEN 10 MG/ML IV SOLN
INTRAVENOUS | Status: DC | PRN
  Administered 2017-02-16: 1000 mg via INTRAVENOUS

## 2017-02-16 MED ORDER — MORPHINE SULFATE (PF) 2 MG/ML IV SOLN: 1.0000 mg | INTRAVENOUS | Status: DC | PRN

## 2017-02-16 MED ORDER — MIDAZOLAM HCL 5 MG/5ML IJ SOLN
INTRAMUSCULAR | Status: DC | PRN
  Administered 2017-02-16: 2 mg via INTRAVENOUS

## 2017-02-16 MED ORDER — OXYCODONE HCL 5 MG PO TABS: 5.0000 mg | ORAL_TABLET | Freq: Once | ORAL | Status: DC | PRN

## 2017-02-16 MED ORDER — METOCLOPRAMIDE HCL 10 MG PO TABS: 5.0000 mg | ORAL_TABLET | Freq: Three times a day (TID) | ORAL | Status: DC | PRN

## 2017-02-16 MED ORDER — GLYCOPYRROLATE 0.2 MG/ML IJ SOLN
INTRAMUSCULAR | Status: DC | PRN
  Administered 2017-02-16: 0.2 mg via INTRAVENOUS

## 2017-02-16 MED ORDER — LIDOCAINE HCL (PF) 2 % IJ SOLN
INTRAMUSCULAR | Status: AC
  Filled 2017-02-16: qty 2

## 2017-02-16 MED ORDER — HYDROCODONE-ACETAMINOPHEN 5-325 MG PO TABS
1.0000 | ORAL_TABLET | ORAL | Status: DC | PRN
  Administered 2017-02-17 (×2): 1 via ORAL
  Filled 2017-02-16 (×2): qty 1

## 2017-02-16 MED ORDER — FENTANYL CITRATE (PF) 100 MCG/2ML IJ SOLN
INTRAMUSCULAR | Status: AC
  Administered 2017-02-16: 25 ug via INTRAVENOUS
  Filled 2017-02-16: qty 2

## 2017-02-16 MED ORDER — DEXAMETHASONE SODIUM PHOSPHATE 10 MG/ML IJ SOLN
INTRAMUSCULAR | Status: DC | PRN
  Administered 2017-02-16: 5 mg via INTRAVENOUS

## 2017-02-16 MED ORDER — NEOMYCIN-POLYMYXIN B GU 40-200000 IR SOLN
Status: DC | PRN
  Administered 2017-02-16: 4 mL

## 2017-02-16 MED ORDER — FENTANYL CITRATE (PF) 100 MCG/2ML IJ SOLN
INTRAMUSCULAR | Status: AC
  Filled 2017-02-16: qty 2

## 2017-02-16 MED ORDER — LIDOCAINE HCL (PF) 2 % IJ SOLN
INTRAMUSCULAR | Status: DC | PRN
  Administered 2017-02-16: 50 mg

## 2017-02-16 MED ORDER — SODIUM CHLORIDE 0.9 % IV SOLN
INTRAVENOUS | Status: DC
  Administered 2017-02-16 – 2017-02-17 (×2): via INTRAVENOUS

## 2017-02-16 MED ORDER — ONDANSETRON HCL 4 MG/2ML IJ SOLN
INTRAMUSCULAR | Status: DC | PRN
  Administered 2017-02-16: 4 mg via INTRAVENOUS

## 2017-02-16 MED ORDER — ACETAMINOPHEN 10 MG/ML IV SOLN
INTRAVENOUS | Status: AC
  Filled 2017-02-16: qty 100

## 2017-02-16 MED ORDER — PROPOFOL 10 MG/ML IV BOLUS
INTRAVENOUS | Status: AC
  Filled 2017-02-16: qty 20

## 2017-02-16 MED ORDER — DEXAMETHASONE SODIUM PHOSPHATE 10 MG/ML IJ SOLN
INTRAMUSCULAR | Status: AC
  Filled 2017-02-16: qty 1

## 2017-02-16 MED ORDER — VANCOMYCIN HCL IN DEXTROSE 1-5 GM/200ML-% IV SOLN
1000.0000 mg | Freq: Two times a day (BID) | INTRAVENOUS | Status: AC
  Administered 2017-02-17: 1000 mg via INTRAVENOUS
  Filled 2017-02-16: qty 200

## 2017-02-16 MED ORDER — FENTANYL CITRATE (PF) 100 MCG/2ML IJ SOLN
INTRAMUSCULAR | Status: DC | PRN
  Administered 2017-02-16: 25 ug via INTRAVENOUS
  Administered 2017-02-16: 50 ug via INTRAVENOUS
  Administered 2017-02-16: 25 ug via INTRAVENOUS
  Administered 2017-02-16 (×2): 50 ug via INTRAVENOUS

## 2017-02-16 MED ORDER — PROPOFOL 10 MG/ML IV BOLUS
INTRAVENOUS | Status: DC | PRN
  Administered 2017-02-16: 150 mg via INTRAVENOUS

## 2017-02-16 MED ORDER — BUPIVACAINE HCL (PF) 0.5 % IJ SOLN
INTRAMUSCULAR | Status: DC | PRN
  Administered 2017-02-16: 30 mL

## 2017-02-16 MED ORDER — FENTANYL CITRATE (PF) 100 MCG/2ML IJ SOLN
25.0000 ug | INTRAMUSCULAR | Status: DC | PRN
  Administered 2017-02-16 (×4): 25 ug via INTRAVENOUS

## 2017-02-16 MED ORDER — ONDANSETRON HCL 4 MG PO TABS: 4.0000 mg | ORAL_TABLET | Freq: Four times a day (QID) | ORAL | Status: DC | PRN

## 2017-02-16 MED ORDER — OXYCODONE HCL 5 MG/5ML PO SOLN: 5.0000 mg | Freq: Once | ORAL | Status: DC | PRN

## 2017-02-16 MED ORDER — METHOCARBAMOL 1000 MG/10ML IJ SOLN
500.0000 mg | Freq: Four times a day (QID) | INTRAVENOUS | Status: DC | PRN
  Filled 2017-02-16: qty 5

## 2017-02-16 MED ORDER — ONDANSETRON HCL 4 MG/2ML IJ SOLN
INTRAMUSCULAR | Status: AC
  Filled 2017-02-16: qty 2

## 2017-02-16 MED ORDER — ONDANSETRON HCL 4 MG/2ML IJ SOLN: 4.0000 mg | Freq: Four times a day (QID) | INTRAMUSCULAR | Status: DC | PRN

## 2017-02-16 MED ORDER — NEOMYCIN-POLYMYXIN B GU 40-200000 IR SOLN
Status: AC
  Filled 2017-02-16: qty 4

## 2017-02-16 MED ORDER — SODIUM CHLORIDE 0.9 % IV SOLN
INTRAVENOUS | Status: DC
  Administered 2017-02-16: 13:00:00 via INTRAVENOUS

## 2017-02-16 MED ORDER — KETAMINE HCL 10 MG/ML IJ SOLN
INTRAMUSCULAR | Status: DC | PRN
  Administered 2017-02-16 (×3): 10 mg via INTRAVENOUS
  Administered 2017-02-16: 20 mg via INTRAVENOUS

## 2017-02-16 MED ORDER — FENTANYL 75 MCG/HR TD PT72
75.0000 ug | MEDICATED_PATCH | TRANSDERMAL | Status: DC
  Administered 2017-02-16: 75 ug via TRANSDERMAL
  Filled 2017-02-16: qty 1

## 2017-02-16 MED ORDER — MORPHINE SULFATE (PF) 2 MG/ML IV SOLN
2.0000 mg | INTRAVENOUS | Status: DC | PRN
  Administered 2017-02-16 – 2017-02-17 (×3): 2 mg via INTRAVENOUS
  Filled 2017-02-16 (×3): qty 1

## 2017-02-16 MED ORDER — MIDAZOLAM HCL 2 MG/2ML IJ SOLN
INTRAMUSCULAR | Status: AC
  Filled 2017-02-16: qty 2

## 2017-02-16 MED ORDER — GLYCOPYRROLATE 0.2 MG/ML IJ SOLN
INTRAMUSCULAR | Status: AC
  Filled 2017-02-16: qty 1

## 2017-02-16 MED ORDER — METOCLOPRAMIDE HCL 5 MG/ML IJ SOLN: 5.0000 mg | Freq: Three times a day (TID) | INTRAMUSCULAR | Status: DC | PRN

## 2017-02-16 MED ORDER — BUPIVACAINE HCL (PF) 0.5 % IJ SOLN
INTRAMUSCULAR | Status: AC
  Filled 2017-02-16: qty 30

## 2017-02-16 MED ORDER — BISACODYL 10 MG RE SUPP: 10.0000 mg | Freq: Every day | RECTAL | Status: DC | PRN

## 2017-02-16 MED ORDER — METHOCARBAMOL 500 MG PO TABS
500.0000 mg | ORAL_TABLET | Freq: Four times a day (QID) | ORAL | Status: DC | PRN
  Administered 2017-02-16 – 2017-02-17 (×3): 500 mg via ORAL
  Filled 2017-02-16 (×3): qty 1

## 2017-02-16 MED ORDER — DOCUSATE SODIUM 100 MG PO CAPS
100.0000 mg | ORAL_CAPSULE | Freq: Two times a day (BID) | ORAL | Status: DC
  Administered 2017-02-16 – 2017-02-17 (×2): 100 mg via ORAL
  Filled 2017-02-16 (×2): qty 1

## 2017-02-16 MED ORDER — OXYCODONE-ACETAMINOPHEN 5-325 MG PO TABS
1.0000 | ORAL_TABLET | ORAL | Status: DC | PRN
  Administered 2017-02-16: 2 via ORAL
  Filled 2017-02-16: qty 2

## 2017-02-16 SURGICAL SUPPLY — 47 items
BNDG COHESIVE 4X5 TAN STRL (GAUZE/BANDAGES/DRESSINGS) ×2 IMPLANT
BONE CANC CHIPS 20CC PCAN1/4 (Bone Implant) ×2 IMPLANT
CANISTER SUCT 1200ML W/VALVE (MISCELLANEOUS) ×2 IMPLANT
CHIPS CANC BONE 20CC PCAN1/4 (Bone Implant) ×1 IMPLANT
CHLORAPREP W/TINT 26ML (MISCELLANEOUS) ×2 IMPLANT
CUFF TOURN 24 STER (MISCELLANEOUS) IMPLANT
DRAPE C-ARM XRAY 36X54 (DRAPES) ×2 IMPLANT
DRAPE INCISE IOBAN 66X45 STRL (DRAPES) ×1 IMPLANT
DRSG EMULSION OIL 3X8 NADH (GAUZE/BANDAGES/DRESSINGS) ×2 IMPLANT
ELECT BLADE 6.5 EXT (BLADE) ×1 IMPLANT
ELECT CAUTERY BLADE 6.4 (BLADE) ×2 IMPLANT
ELECT REM PT RETURN 9FT ADLT (ELECTROSURGICAL) ×2
ELECTRODE REM PT RTRN 9FT ADLT (ELECTROSURGICAL) ×1 IMPLANT
GAUZE PETRO XEROFOAM 1X8 (MISCELLANEOUS) ×2 IMPLANT
GAUZE SPONGE 4X4 12PLY STRL (GAUZE/BANDAGES/DRESSINGS) ×2 IMPLANT
GLOVE BIOGEL PI IND STRL 9 (GLOVE) ×1 IMPLANT
GLOVE BIOGEL PI INDICATOR 9 (GLOVE) ×1
GLOVE SURG SYN 9.0  PF PI (GLOVE) ×1
GLOVE SURG SYN 9.0 PF PI (GLOVE) ×1 IMPLANT
GOWN SRG 2XL LVL 4 RGLN SLV (GOWNS) ×1 IMPLANT
GOWN STRL NON-REIN 2XL LVL4 (GOWNS) ×2
GOWN STRL REUS W/ TWL LRG LVL3 (GOWN DISPOSABLE) ×1 IMPLANT
GOWN STRL REUS W/TWL LRG LVL3 (GOWN DISPOSABLE) ×2
GOWN STRL REUS W/TWL XL LVL4 (GOWN DISPOSABLE) ×2 IMPLANT
GRAFT BNE CANC CHIPS 1-8 20CC (Bone Implant) IMPLANT
KIT RM TURNOVER STRD PROC AR (KITS) ×2 IMPLANT
NDL FILTER BLUNT 18X1 1/2 (NEEDLE) ×1 IMPLANT
NEEDLE FILTER BLUNT 18X 1/2SAF (NEEDLE) ×1
NEEDLE FILTER BLUNT 18X1 1/2 (NEEDLE) ×1 IMPLANT
NS IRRIG 1000ML POUR BTL (IV SOLUTION) ×2 IMPLANT
PACK EXTREMITY ARMC (MISCELLANEOUS) ×1 IMPLANT
PACK HIP COMPR (MISCELLANEOUS) ×1 IMPLANT
PAD ABD DERMACEA PRESS 5X9 (GAUZE/BANDAGES/DRESSINGS) ×4 IMPLANT
PREP PVP WINGED SPONGE (MISCELLANEOUS) ×1 IMPLANT
STAPLER SKIN PROX 35W (STAPLE) ×2 IMPLANT
STOCKINETTE M/LG 89821 (MISCELLANEOUS) ×1 IMPLANT
SUT ETHIBOND NAB CT1 #1 30IN (SUTURE) ×2 IMPLANT
SUT ETHILON 3-0 FS-10 30 BLK (SUTURE) ×2
SUT VIC AB 0 CT1 36 (SUTURE) ×2 IMPLANT
SUT VIC AB 1 CT1 36 (SUTURE) ×1 IMPLANT
SUT VIC AB 1 CTX 27 (SUTURE) ×4 IMPLANT
SUT VIC AB 2-0 CT1 (SUTURE) ×1 IMPLANT
SUT VIC AB 2-0 CT1 27 (SUTURE) ×2
SUT VIC AB 2-0 CT1 TAPERPNT 27 (SUTURE) ×1 IMPLANT
SUTURE EHLN 3-0 FS-10 30 BLK (SUTURE) ×1 IMPLANT
SYRINGE 10CC LL (SYRINGE) ×2 IMPLANT
WATER STERILE IRR 1000ML POUR (IV SOLUTION) ×2 IMPLANT

## 2017-02-16 NOTE — Anesthesia Postprocedure Evaluation (Signed)
Anesthesia Post Note  Patient: [name] [name]  Procedure(s) Performed: Procedure(s) (LRB): HARDWARE REMOVAL FROM HIP (Right)  Patient location during evaluation: PACU Anesthesia Type: General Level of consciousness: awake and alert Pain management: pain level controlled Vital Signs Assessment: post-procedure vital signs reviewed and stable Respiratory status: spontaneous breathing, nonlabored ventilation, respiratory function stable and patient connected to nasal cannula oxygen Cardiovascular status: blood pressure returned to baseline and stable Postop Assessment: no signs of nausea or vomiting Anesthetic complications: no     Last Vitals:  Vitals:   02/16/17 2036 02/16/17 2205  BP: (!) 105/51 (!) 105/56  Pulse: 67 70  Resp: 16 16  Temp: 36.5 C 36.6 C    Last Pain:  Vitals:   02/16/17 2205  TempSrc: Oral  PainSc:                  Molli Barrows

## 2017-02-16 NOTE — Anesthesia Post-op Follow-up Note (Cosign Needed)
Anesthesia QCDR form completed.        

## 2017-02-16 NOTE — H&P (Signed)
Reviewed paper H+P, will be scanned into chart. Patient examined No changes noted.  

## 2017-02-16 NOTE — Anesthesia Procedure Notes (Signed)
Procedure Name: LMA Insertion Performed by: Slyvia Lartigue Pre-anesthesia Checklist: Patient identified, Patient being monitored, Timeout performed, Emergency Drugs available and Suction available Patient Re-evaluated:Patient Re-evaluated prior to inductionOxygen Delivery Method: Circle system utilized Preoxygenation: Pre-oxygenation with 100% oxygen Intubation Type: IV induction Ventilation: Mask ventilation without difficulty LMA: LMA inserted LMA Size: 3.5 Tube type: Oral Number of attempts: 1 Placement Confirmation: positive ETCO2 and breath sounds checked- equal and bilateral Tube secured with: Tape Dental Injury: Teeth and Oropharynx as per pre-operative assessment        

## 2017-02-16 NOTE — Progress Notes (Signed)
CH responded to an OR for an AD. Pt presented in obvious pain. [place] assessed this was not a good time to educate and the patient agreed. [place] left AD materials with Pt to review when she is feeling better. CH provided prayer. CH is available for follow up as needed.    02/16/17 1700  Clinical Encounter Type  Visited With Patient;Patient and family together  Visit Type Initial;Spiritual support  Referral From Nurse  Spiritual Encounters  Spiritual Needs Literature;Prayer  Advance Directives (For Healthcare)  Does Patient Have a Medical Advance Directive? No  Would patient like information on creating a medical advance directive? Yes (Inpatient - patient requests chaplain consult to create a medical advance directive)  [place] Directives  Does Patient Have a Mental Health Advance Directive? No  Would patient like information on creating a mental health advance directive? Yes (Inpatient - patient requests chaplain consult to create a mental health advance directive)

## 2017-02-16 NOTE — Transfer of Care (Signed)
Immediate Anesthesia Transfer of Care Note  Patient: [name] [name]  Procedure(s) Performed: Procedure(s): HARDWARE REMOVAL FROM HIP (Right)  Patient Location: PACU  Anesthesia Type:General  Level of Consciousness: sedated and responds to stimulation  Airway & Oxygen Therapy: Patient Spontanous Breathing and Patient connected to face mask oxygen  Post-op Assessment: Report given to RN and Post -op Vital signs reviewed and stable  Post vital signs: Reviewed and stable  Last Vitals:  Vitals:   02/16/17 1215 02/16/17 1545  BP: 117/81 135/83  Pulse: 75 75  Resp: 18 11  Temp: (!) 36.1 C 36.6 C    Last Pain:  Vitals:   02/16/17 1545  TempSrc:   PainSc: Asleep         Complications: No apparent anesthesia complications

## 2017-02-16 NOTE — Op Note (Signed)
02/16/2017  3:57 PM  PATIENT:  [name] [name]  51 y.o. female  PRE-OPERATIVE DIAGNOSIS:  PAINFUL ORTHOPEDIC HARDWARE right hip  POST-OPERATIVE DIAGNOSIS:  PAINFUL ORTHOPEDIC HARDWARE  PROCEDURE:  Procedure(s): HARDWARE REMOVAL FROM HIP (Right)  SURGEON: Laurene Footman, MD  ASSISTANTS: None  ANESTHESIA:   general  EBL:  Total I/O In: 800 [I.V.:800] Out: 300 [Blood:300]  BLOOD ADMINISTERED:none  DRAINS: none   LOCAL MEDICATIONS USED:  MARCAINE     SPECIMEN:  No Specimen  DISPOSITION OF SPECIMEN:  N/A  COUNTS:  YES  TOURNIQUET:  * No tourniquets in log *  IMPLANTS: Bone graft cancellus chips packed into femoral head and neck  DICTATION: .Dragon Dictation patient brought the operating room and after adequate anesthesia was obtained the patient was placed on the fracture table. The left leg is in a well-leg holder right foot in the traction boot without traction applied. After prepping and draping in sterile fashion and good visualization of the implants. Lateral approach was made. The distal portion of her prior incision was utilized and after there is extensive subcutaneous fat the IT band was incised and the vastus lateralis was quite atrophied. Been cutting down to the plate there is extensive bursa over the plate itself consistent with symptomatic hardware. The entire plate was exposed and the 4 screws removed without much difficulty although one of the screws had stripped and the broken screw set was required but the screw was able to removed without different radial difficulty. Next partial procedure was quite difficultness getting the plate to come off the bone and get the barrel of the plate out of the proximal femur this took some time care being taken to dissect around the plate to make sure there is no bony overgrowth with use of osteotomes the plate was socially elevated off the bone at the distal portion and then slowly osteotomes slid next to the initial  osteotome going up the leg to the level of the barrel at which point the barrel did come out slightly and the barrel was unable to be removed. The screws removed without difficulty from the leg screw hole with a prior compression she removed and initial exposure the defect in the femoral head was then packed with 20 cc of cancellus bone chips and the wound irrigated the IT band was closed using running #1 Vicryl 2-0 Vicryl subcutaneously and skin staples Xeroform 4 x 4 ABDs and foam tape applied. Prior to closure of the subcutaneous layer 30 cc of half percent Sensorcaine plain was injected into the subcutaneous tissue to aid in postop analgesia  PLAN OF CARE: Admit for overnight observation  PATIENT DISPOSITION:  PACU - hemodynamically stable.

## 2017-02-16 NOTE — Anesthesia Preprocedure Evaluation (Signed)
Anesthesia Evaluation  Patient identified by MRN, date of birth, ID band Patient awake    Reviewed: Allergy & Precautions, H&P , NPO status , Patient's Chart, lab work & pertinent test results  History of Anesthesia Complications Negative for: history of anesthetic complications  Airway Mallampati: III  TM Distance: >3 FB Neck ROM: full    Dental  (+) Poor Dentition, Chipped, Caps   Pulmonary asthma ,    Pulmonary exam normal breath sounds clear to auscultation       Cardiovascular Exercise Tolerance: Poor (-) angina(-) Past MI Normal cardiovascular exam Rhythm:regular Rate:Normal     Neuro/Psych  Headaches, PSYCHIATRIC DISORDERS Anxiety Depression  Neuromuscular disease    GI/Hepatic Neg liver ROS, hiatal hernia, GERD  Medicated and Controlled,  Endo/Other  diabetes, Type 2Hypothyroidism   Renal/GU      Musculoskeletal  (+) Fibromyalgia -  Abdominal   Peds  Hematology negative hematology ROS (+)   Anesthesia Other Findings Past Medical History: 06/06/2007: ANXIETY No date: Asthma 02/22/2009: BACK PAIN No date: Cervical cancer ([place]) 06/06/2007: DEPRESSION No date: Diabetes mellitus without complication ([place])     Comment: patient denies No date: Essential tremor No date: Fibromyalgia 07/07/2007: FREQUENCY, URINARY No date: Gastritis 06/06/2007: GERD No date: Headache No date: History of hiatal hernia No date: History of kidney stones 06/06/2007: HYPOTHYROIDISM 09/11/2010: INTERSTITIAL CYSTITIS 2006: Lupus No date: Lupus 11/05/2010: NEPHROLITHIASIS, HX OF No date: Neuropathy ([place]) 10/14/2009: OSTEOPENIA 10/14/2009: OVERACTIVE BLADDER 06/06/2007: PAIN, CHRONIC NEC 08/19/2007: POLYARTHRITIS 11/08/2007: UNSPECIFIED OPTIC NEURITIS 02/22/2009: WEIGHT GAIN  Past Surgical History: No date: APPENDECTOMY No date: JOINT REPLACEMENT 06-2009: REVISION TOTAL HIP ARTHROPLASTY No date: TONSILLECTOMY No date:  TONSILLECTOMY No date: WRIST RECONSTRUCTION  BMI    Body Mass Index:  30.99 kg/m      Reproductive/Obstetrics negative OB ROS                             Anesthesia Physical Anesthesia Plan  ASA: III  Anesthesia Plan: General LMA   Post-op Pain Management:    Induction:   Airway Management Planned:   Additional Equipment:   Intra-op Plan:   Post-operative Plan:   Informed Consent: I have reviewed the patients History and Physical, chart, labs and discussed the procedure including the risks, benefits and alternatives for the proposed anesthesia with the patient or authorized representative who has indicated his/her understanding and acceptance.   Dental Advisory Given  Plan Discussed with: Anesthesiologist, CRNA and Surgeon  Anesthesia Plan Comments:         Anesthesia Quick Evaluation

## 2017-02-16 NOTE — Progress Notes (Signed)
Rept to Dr. Rudene Christians that pt was admitted from PACU with Fentanyl patch to posterior R upper shoulder. Per pt patch should be changed today. Pt has received percocet PO without much pain relief. Per Dr. Rudene Christians order, can change pt's Fentanyl patch and continue it on the floor, also pt may have morphine 2 mg IV every 2 hours as needed for pain. Will continue to monitor.

## 2017-02-16 NOTE — Discharge Instructions (Signed)
Using a walker with ambulation is much as possible over the next month. Try to avoid stairs much as possible. Keep dressing clean and dry

## 2017-02-17 ENCOUNTER — Encounter: Payer: Self-pay | Admitting: Orthopedic Surgery

## 2017-02-17 DIAGNOSIS — T8484XA Pain due to internal orthopedic prosthetic devices, implants and grafts, initial encounter: Secondary | ICD-10-CM | POA: Diagnosis not present

## 2017-02-17 MED ORDER — ASPIRIN 325 MG PO TABS: 325.0000 mg | ORAL_TABLET | Freq: Every day | ORAL | 0 refills | Status: DC

## 2017-02-17 MED ORDER — ASPIRIN 325 MG PO TABS
325.0000 mg | ORAL_TABLET | Freq: Every day | ORAL | Status: DC
  Administered 2017-02-17: 325 mg via ORAL
  Filled 2017-02-17: qty 1

## 2017-02-17 MED ORDER — OXYCODONE-ACETAMINOPHEN 5-325 MG PO TABS
1.0000 | ORAL_TABLET | ORAL | 0 refills | Status: DC | PRN
Start: 2017-02-17 — End: 2018-07-06

## 2017-02-17 NOTE — Discharge Summary (Signed)
Physician Discharge Summary  [name] MRN: [id number] DOB/AGE: 1965-12-11 51 y.o.  Admit date: 02/16/2017 Discharge date: 02/17/2017  Admission Diagnoses:  [place] HARDWARE   Discharge Diagnoses: Patient Active Problem List   Diagnosis Date Noted  . Status post hardware removal 02/16/2017  . 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 Abdominal pain, epigastric 09/08/2013  . Type II or unspecified type diabetes mellitus without mention of complication, uncontrolled 03/15/2013  . UTI (urinary tract infection) 02/15/2013  . Chronic pain 03/31/2011  . NEPHROLITHIASIS, HX OF 11/05/2010  . INTERSTITIAL CYSTITIS 09/11/2010  . OVERACTIVE BLADDER 10/14/2009  . OSTEOPENIA 10/14/2009  . BACK PAIN 02/22/2009  . WEIGHT GAIN 02/22/2009  . 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 POLYARTHRITIS 08/19/2007  . FREQUENCY, URINARY 07/07/2007  . HYPOTHYROIDISM 06/06/2007  . ANXIETY 06/06/2007  . DEPRESSION 06/06/2007  . PAIN, CHRONIC NEC 06/06/2007  . GERD 06/06/2007    Past Medical History:  Diagnosis Date  . ANXIETY 06/06/2007  . Asthma   . BACK PAIN 02/22/2009  . Cervical cancer ([place])   . DEPRESSION 06/06/2007  . Diabetes mellitus without complication ([place])    patient denies  . Essential tremor   . Fibromyalgia   . FREQUENCY, URINARY 07/07/2007  . Gastritis   . GERD 06/06/2007  . Headache   . History of hiatal hernia   . History of kidney stones   . HYPOTHYROIDISM 06/06/2007  . INTERSTITIAL CYSTITIS 09/11/2010  . Lupus 2006  . Lupus   . NEPHROLITHIASIS, HX OF 11/05/2010  . Neuropathy ([place])   . OSTEOPENIA 10/14/2009  . OVERACTIVE BLADDER 10/14/2009  . PAIN, CHRONIC NEC 06/06/2007  . POLYARTHRITIS 08/19/2007  . 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 WEIGHT GAIN 02/22/2009     Transfusion: none   Consultants (if any):   Discharged Condition: Improved  Hospital Course: [name] [name] is an 51 y.o. female who was admitted 02/16/2017 with a diagnosis  of painful hip hardware and went to the operating room on 02/16/2017 and underwent the above named procedures.    Surgeries: Procedure(s): HARDWARE REMOVAL FROM HIP on 02/16/2017 Patient tolerated the surgery well. Taken to PACU where she was stabilized and then transferred to the orthopedic floor.  Started on aspirin 325 mg. Foot pumps applied bilaterally at 80 mm. Heels elevated on bed with rolled towels. No evidence of DVT. Negative [place]. Physical therapy started on day #1 for gait training and transfer. OT started day #1 for ADL and assisted devices.  Patient's foley was d/c on day #1. Patient's IV  was d/c on day #1.  On post op day #1 patient was stable and ready for discharge to home.  Implants: Bone graft cancellus chips packed into femoral head and neck  She was given perioperative antibiotics:  Anti-infectives    Start     Dose/Rate Route Frequency Ordered Stop   02/17/17 0130  vancomycin (VANCOCIN) IVPB 1000 mg/200 mL premix     1,000 mg 200 mL/hr over 60 Minutes Intravenous Every 12 hours 02/16/17 1658 02/17/17 0250   02/15/17 2215  vancomycin (VANCOCIN) 500 mg in sodium chloride 0.9 % 100 mL IVPB     500 mg 100 mL/hr over 60 Minutes Intravenous  Once 02/15/17 2208 02/16/17 1430    .  She was given sequential compression devices, early ambulation, and Aspirin for DVT prophylaxis.  She benefited maximally from the hospital stay and there were no complications.    Recent vital signs:  Vitals:   02/17/17 0452 02/17/17 0745  BP: (!) 99/59 (!) 105/42  Pulse: 66 76  Resp: 18   Temp: 97.5 F (36.4 C) 98 F (36.7 C)    Recent laboratory studies:  Lab Results  Component Value Date   HGB 12.7 02/10/2017   HGB 12.6 10/31/2016   HGB 13.3 03/14/2016   Lab Results  Component Value Date   WBC 8.1 02/10/2017   PLT 152 02/10/2017   Lab Results  Component Value Date   INR 1.7 (H) 07/08/2009   Lab Results  Component Value Date   NA 141 02/10/2017   K 4.7  02/10/2017   CL 106 02/10/2017   CO2 27 02/10/2017   BUN 15 02/10/2017   CREATININE 0.86 02/10/2017   GLUCOSE 88 02/10/2017    Discharge Medications:   Allergies as of 02/17/2017      Reactions   Latex Anaphylaxis   Clarithromycin Hives   Compazine [prochlorperazine Edisylate] Other (See Comments)   tremors   Dhea [nutritional Supplements] Other (See Comments)   Headaches.   Prasterone Other (See Comments)   Headaches.   Promethazine Hcl Other (See Comments)   CNS disorder   Fish Allergy Hives, Swelling, Rash   Facial swelling   Hydromorphone Hcl Rash, Hives   "Rash all over"   Penicillins Rash, Other (See Comments)   Has patient had a PCN reaction causing immediate rash, facial/tongue/throat swelling, SOB or lightheadedness with hypotension:No Has patient had a PCN reaction causing severe rash involving mucus membranes or skin necrosis:No Has patient had a PCN reaction that required hospitalization:No Has patient had a PCN reaction occurring within the last 10 years:No If all of the above answers are "NO", then may proceed with Cephalosporin use.      Medication List    STOP taking these medications   etodolac 500 MG tablet Commonly known as:  LODINE     TAKE these medications   ARIPiprazole 20 MG tablet Commonly known as:  ABILIFY Take 20 mg by mouth daily.   aspirin EC 325 MG tablet Take 325 mg by mouth daily. What changed:  Another medication with the same name was added. Make sure you understand how and when to take each.   aspirin 325 MG tablet Take 1 tablet (325 mg total) by mouth daily. Start taking on:  02/18/2017 What changed:  You were already taking a medication with the same name, and this prescription was added. Make sure you understand how and when to take each.   Biotin 5000 MCG Subl Place 10,000 mcg under the tongue daily.   CALCIUM GUMMIES PO Take 2 tablets by mouth daily.   dapsone 25 MG tablet Take 25 mg by mouth every morning.    diclofenac sodium 1 % Gel Commonly known as:  VOLTAREN Apply 2 g topically 4 (four) times daily as needed (for knee & finger pain.).   DULoxetine 60 MG capsule Commonly known as:  CYMBALTA Take 60 mg by mouth 2 (two) times daily.   fentaNYL 75 MCG/HR Commonly known as:  DURAGESIC - dosed mcg/hr Place 75 mcg onto the skin every 3 (three) days.   fluconazole 150 MG tablet Commonly known as:  DIFLUCAN Take 150 mg by mouth See admin instructions. Take 1 tablet on 01/29/17, then take 1 tablet in 3 days, & then take final dose 7 days later.   folic acid 1 MG tablet Commonly known as:  FOLVITE Take 1 mg by mouth daily.   hydroxychloroquine 200 MG tablet Commonly known as:  PLAQUENIL Take 200  mg by mouth 2 (two) times daily.   ibuprofen 200 MG tablet Commonly known as:  ADVIL,MOTRIN Take 600 mg by mouth every 8 (eight) hours as needed (for pain/headaches.).   levothyroxine 25 MCG tablet Commonly known as:  SYNTHROID, LEVOTHROID Take 1 tablet (25 mcg total) by mouth daily before breakfast.   LORazepam 2 MG tablet Commonly known as:  ATIVAN Take 1-2 mg by mouth See admin instructions. 1 MG DAILY AS NEEDED FOR ANXIETY & SCHEDULED AT BEDTIME EACH NIGHT   methylphenidate 18 MG CR tablet Commonly known as:  CONCERTA Take 18 mg by mouth 2 (two) times daily. 0500 & 1400   montelukast 10 MG tablet Commonly known as:  SINGULAIR Take 10 mg by mouth daily.   ondansetron 4 MG disintegrating tablet Commonly known as:  ZOFRAN-ODT Take 4 mg by mouth every 8 (eight) hours as needed (for nausea/vomiting).   oxyCODONE-acetaminophen 5-325 MG tablet Commonly known as:  PERCOCET/ROXICET Take 1 tablet by mouth every 6 (six) hours as needed (for pain.). What changed:  Another medication with the same name was added. Make sure you understand how and when to take each.   oxyCODONE-acetaminophen 5-325 MG tablet Commonly known as:  PERCOCET/ROXICET Take 1-2 tablets by mouth every 4 (four) hours  as needed for moderate pain. What changed:  You were already taking a medication with the same name, and this prescription was added. Make sure you understand how and when to take each.   pantoprazole 40 MG tablet Commonly known as:  PROTONIX Take one tablet by mouth twice daily What changed:  See the new instructions.   predniSONE 5 MG tablet Commonly known as:  DELTASONE Take 10 mg by mouth daily with breakfast.   PROVENTIL HFA 108 (90 Base) MCG/ACT inhaler Generic drug:  albuterol Inhale 2 puffs into the lungs every 4 (four) hours as needed for wheezing or shortness of breath.   REFRESH OP Place 1 drop into both eyes 2 (two) times daily.   sulfamethoxazole-trimethoprim 800-160 MG tablet Commonly known as:  BACTRIM DS,SEPTRA DS Take 1 tablet by mouth 2 (two) times daily. 14 day therapy course patient began on 01/29/17   topiramate 100 MG tablet Commonly known as:  TOPAMAX Take one tablet by mouth twice daily What changed:  how much to take  how to take this  when to take this  additional instructions   triamcinolone cream 0.1 % Commonly known as:  KENALOG Apply 1 application topically 2 (two) times daily. What changed:  when to take this  reasons to take this   Vitamin C 500 MG Chew Chew 2,500 mg by mouth daily.       Diagnostic Studies: Dg C-arm 61-120 Min-no Report  Result Date: 02/16/2017 CLINICAL DATA:  Surgery. EXAM: DG C-ARM 61-120 MIN-NO REPORT; DG HIP (WITH OR WITHOUT PELVIS) 1V PORT RIGHT CONTRAST:  None. FLUOROSCOPY TIME:  Fluoroscopy Time:  None listed. COMPARISON:  CT 02/05/2015 . FINDINGS: Surgical staples noted over the right hip. Surgical defects noted the proximal right femur from prior plate and screw fixation device removal. No acute abnormality identified. Degenerative changes right hip. IMPRESSION: Prior plate and screw fixation device removal . No acute bony abnormality identified . Electronically Signed   By: Marcello Moores  Register   On:  02/16/2017 16:53   Dg Hip Port Unilat With Pelvis 1v Right  Result Date: 02/16/2017 CLINICAL DATA:  Surgery. EXAM: DG C-ARM 61-120 MIN-NO REPORT; DG HIP (WITH OR WITHOUT PELVIS) 1V PORT RIGHT CONTRAST:  None.  FLUOROSCOPY TIME:  Fluoroscopy Time:  None listed. COMPARISON:  CT 02/05/2015 . FINDINGS: Surgical staples noted over the right hip. Surgical defects noted the proximal right femur from prior plate and screw fixation device removal. No acute abnormality identified. Degenerative changes right hip. IMPRESSION: Prior plate and screw fixation device removal . No acute bony abnormality identified . Electronically Signed   By: Marcello Moores  Register   On: 02/16/2017 16:53    Disposition: 01-Home or [place], MD Follow up in 3 day(s).   Specialty:  Orthopedic Surgery Why:  For wound re-check Contact information: [place] [address] [phone]            Signed: Feliberto Gottron 02/17/2017, 1:23 PM

## 2017-02-17 NOTE — Evaluation (Signed)
Physical Therapy Evaluation Patient Details Name: [name] [name] MRN: [id number] DOB: 10/25/1966 Today's Date: 02/17/2017   History of Present Illness  Pt is a 51 yo female s/p hardware removal of the R hip, WBAT. s/p ORIF of R hip (2010) and lupus   Clinical Impression  Pt awake, alert and willing to participate in PT eval. Rated her pain as 7/10 and received pain meds prior to session. She displays decreased strength (3/5) and increased pain of the R hip due to recent surgery above. Pt is modified independent for bed mobility and transfers, requires use of RW in standing for improved balance. She was able to ambulate around nursing station w/ RW and PT supervision and displayed slow antalgic gait pattern that improved throughout ambulation. Pt safely able to ascend and descend 4 steps w/ use of bilat railing and min guarding for safety. She states she lives at home with her mother, daughter and her daughter's boyfriend and is able to live on lower level, she has been receiving outpatient PT services prior to admittance. Overall pt presents w/ decreased R hip strength and ROM and increase pain that limit functional mobility, she will benefit from skilled PT to correct deficits, Recommend pt receive outpatient PT services following acute hospital stay.      Follow Up Recommendations Outpatient PT    Equipment Recommendations       Recommendations for Other Services       Precautions / Restrictions Precautions Precautions: Fall Restrictions Weight Bearing Restrictions: Yes RLE Weight Bearing: Weight bearing as tolerated      Mobility  Bed Mobility Overal bed mobility: Modified Independent             General bed mobility comments: increased time to advance R LE when moving from supine to sitting   Transfers Overall transfer level: Modified independent Equipment used: Rolling walker (2 wheeled)             General transfer comment: demonstrated good safe technique  and use of bilat UEs, slight increase in pain w/ WBing   Ambulation/Gait Ambulation/Gait assistance: Supervision Ambulation Distance (Feet): 160 Feet Assistive device: Rolling walker (2 wheeled) Gait Pattern/deviations: Step-through pattern;Decreased stance time - right;Decreased step length - left;Antalgic   Gait velocity interpretation: Below normal speed for age/gender General Gait Details: Pt able to progress from step to to step through gait pattern w/ cuing, pt ambulated around nursing station, states she is not a fast ambulator at baseline, no LOB or buckling of the R LE  Stairs Stairs: Yes Stairs assistance: Min guard Stair Management: Two rails Number of Stairs: 4 General stair comments: pt able to follow cues and safely ascend and descend steps w/ step to pattern and use of bilat hand rails to simulate home environment  Wheelchair Mobility    Modified Rankin (Stroke Patients Only)       Balance Overall balance assessment: Needs assistance;History of Falls Sitting-balance support: Feet supported;No upper extremity supported Sitting balance-Leahy Scale: Good Sitting balance - Comments: able to maintain upright posture w/o back support   Standing balance support: Bilateral upper extremity supported;During functional activity Standing balance-Leahy Scale: Fair Standing balance comment: requires use of RW for improved stability                             Pertinent Vitals/Pain Pain Assessment: 0-10 Pain Score: 7  Pain Location: R hip Pain Descriptors / Indicators: Aching Pain Intervention(s): Monitored during session;Premedicated before session;Limited  activity within patient's tolerance    Home Living Family/patient expects to be discharged to:: Private residence Living Arrangements: Children;Parent Available Help at Discharge: Family;Available 24 hours/day Type of Home: House Home Access: Stairs to enter Entrance Stairs-Rails: Right;Left;Can reach  both Entrance Stairs-Number of Steps: 5 Home Layout: Two level;Able to live on main level with bedroom/bathroom Home Equipment: Gilford Rile - 2 wheels;Bedside commode;Cane - single point      Prior Function Level of Independence: Independent with assistive device(s)         Comments: pt is independent in all ADLs normally uses SPC to ambulate, lives at home with mother, daughter and the daughter's boyfriend, is on disability     Hand Dominance        Extremity/Trunk Assessment   Upper Extremity Assessment Upper Extremity Assessment: Overall WFL for tasks assessed    Lower Extremity Assessment Lower Extremity Assessment: RLE deficits/detail RLE Deficits / Details: decreased R hip strength grossly 3/5 increased pain on R hip RLE: Unable to fully assess due to pain       Communication   Communication: No difficulties  Cognition Arousal/Alertness: Awake/alert Behavior During Therapy: WFL for tasks assessed/performed Overall Cognitive Status: Within Functional Limits for tasks assessed                      General Comments      Exercises     Assessment/Plan    PT Assessment Patient needs continued PT services  PT Problem List Decreased range of motion;Decreased strength;Decreased balance;Decreased mobility;Decreased knowledge of use of DME       PT Treatment Interventions DME instruction;Gait training;Stair training;Functional mobility training;Therapeutic activities;Therapeutic exercise;Balance training;Patient/family education    PT Goals (Current goals can be found in the Care Plan section)  Acute Rehab PT Goals Patient Stated Goal: To return home and improve pain in R hip PT Goal Formulation: With patient Time For Goal Achievement: 03/03/17 Potential to Achieve Goals: Good    Frequency BID   Barriers to discharge        Co-evaluation               End of Session Equipment Utilized During Treatment: Gait belt   Patient left: in bed;with  family/visitor present;with bed alarm set Nurse Communication: Mobility status PT Visit Diagnosis: Unsteadiness on feet (R26.81);Repeated falls (R29.6);Pain Pain - Right/Left: Right Pain - part of body: Hip         Time: 1333-1400 PT Time Calculation (min) (ACUTE ONLY): 27 min   Charges:         PT G Codes:         Ashawna Hanback Student PT 02/17/2017, 2:55 PM

## 2017-02-17 NOTE — Progress Notes (Signed)
DISCHARGE NOTE:  Pt given discharge instructions and prescriptions ( Asprin, Percocet). Pt verbalized understanding. Pt wheeled to car by staff.

## 2017-02-17 NOTE — Progress Notes (Signed)
   Subjective: 1 Day Post-Op Procedure(s) (LRB): HARDWARE REMOVAL FROM HIP (Right) Patient reports pain as moderate.   Patient is well, and has had no acute complaints or problems Denies any CP, SOB, ABD pain. We will continue therapy today.  Plan is to go Home after hospital stay.  Objective: Vital signs in last 24 hours: Temp:  [96.9 F (36.1 C)-98.3 F (36.8 C)] 98 F (36.7 C) (03/21 0745) Pulse Rate:  [66-87] 76 (03/21 0745) Resp:  [11-18] 18 (03/21 0452) BP: (99-135)/(42-93) 105/42 (03/21 0745) SpO2:  [94 %-100 %] 97 % (03/21 0745) Weight:  [98 kg (216 lb)] 98 kg (216 lb) (03/20 1215)  Intake/Output from previous day: 03/20 0701 - 03/21 0700 In: 1825 [I.V.:1625; IV Piggyback:200] Out: 300 [Blood:300] Intake/Output this shift: No intake/output data recorded.  No results for input(s): HGB in the last 72 hours. No results for input(s): WBC, RBC, HCT, PLT in the last 72 hours. No results for input(s): NA, K, CL, CO2, BUN, CREATININE, GLUCOSE, CALCIUM in the last 72 hours. No results for input(s): LABPT, INR in the last 72 hours.  EXAM General - Patient is Alert, Appropriate and Oriented Extremity - Neurovascular intact Sensation intact distally Intact pulses distally Dorsiflexion/Plantar flexion intact No cellulitis present Compartment soft Dressing - dressing C/D/I and no drainage Motor Function - intact, moving foot and toes well on exam.   Past Medical History:  Diagnosis Date  . ANXIETY 06/06/2007  . Asthma   . BACK PAIN 02/22/2009  . Cervical cancer ([place])   . DEPRESSION 06/06/2007  . Diabetes mellitus without complication ([place])    patient denies  . Essential tremor   . Fibromyalgia   . FREQUENCY, URINARY 07/07/2007  . Gastritis   . GERD 06/06/2007  . Headache   . History of hiatal hernia   . History of kidney stones   . HYPOTHYROIDISM 06/06/2007  . INTERSTITIAL CYSTITIS 09/11/2010  . Lupus 2006  . Lupus   . NEPHROLITHIASIS, HX OF 11/05/2010  . Neuropathy  ([place])   . OSTEOPENIA 10/14/2009  . OVERACTIVE BLADDER 10/14/2009  . PAIN, CHRONIC NEC 06/06/2007  . POLYARTHRITIS 08/19/2007  . 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 WEIGHT GAIN 02/22/2009    Assessment/Plan:   1 Day Post-Op Procedure(s) (LRB): HARDWARE REMOVAL FROM HIP (Right) Active Problems:   Status post hardware removal  Estimated body mass index is 30.99 kg/m as calculated from the following:   Height as of this encounter: 5\' 10"  (1.778 m).   Weight as of this encounter: 98 kg (216 lb). Advance diet Up with therapy  CM to assist with discharge   Weight-Bearing as tolerated to right leg   T. Rachelle Hora, PA-C [place] 02/17/2017, 8:17 AM

## 2017-02-26 ENCOUNTER — Encounter: Payer: Self-pay | Admitting: Emergency Medicine

## 2017-02-26 ENCOUNTER — Emergency Department: Payer: Medicare Other

## 2017-02-26 ENCOUNTER — Emergency Department
Admission: EM | Admit: 2017-02-26 | Discharge: 2017-02-26 | Disposition: A | Discharge status: Home / Self Care | Payer: Medicare Other | Attending: Emergency Medicine | Admitting: Emergency Medicine

## 2017-02-26 DIAGNOSIS — M25551 Pain in right hip: Secondary | ICD-10-CM | POA: Insufficient documentation

## 2017-02-26 DIAGNOSIS — G8918 Other acute postprocedural pain: Secondary | ICD-10-CM | POA: Insufficient documentation

## 2017-02-26 DIAGNOSIS — Z7982 Long term (current) use of aspirin: Secondary | ICD-10-CM | POA: Diagnosis not present

## 2017-02-26 DIAGNOSIS — E119 Type 2 diabetes mellitus without complications: Secondary | ICD-10-CM | POA: Insufficient documentation

## 2017-02-26 DIAGNOSIS — J45909 Unspecified asthma, uncomplicated: Secondary | ICD-10-CM | POA: Diagnosis not present

## 2017-02-26 DIAGNOSIS — Z79899 Other long term (current) drug therapy: Secondary | ICD-10-CM | POA: Insufficient documentation

## 2017-02-26 DIAGNOSIS — M7989 Other specified soft tissue disorders: Secondary | ICD-10-CM | POA: Diagnosis not present

## 2017-02-26 DIAGNOSIS — R609 Edema, unspecified: Secondary | ICD-10-CM

## 2017-02-26 LAB — COMPREHENSIVE METABOLIC PANEL
ALT: 29 U/L (ref 14–54)
AST: 31 U/L (ref 15–41)
Albumin: 3.7 g/dL (ref 3.5–5.0)
Alkaline Phosphatase: 75 U/L (ref 38–126)
Anion gap: 5 (ref 5–15)
BUN: 15 mg/dL (ref 6–20)
CHLORIDE: 110 mmol/L (ref 101–111)
CO2: 26 mmol/L (ref 22–32)
CREATININE: 0.84 mg/dL (ref 0.44–1.00)
Calcium: 8.9 mg/dL (ref 8.9–10.3)
GFR calc Af Amer: >60 mL/min (ref >60)
GLUCOSE: 109 mg/dL — AB (ref 65–99)
Potassium: 4.3 mmol/L (ref 3.5–5.1)
SODIUM: 141 mmol/L (ref 135–145)
Total Bilirubin: 0.7 mg/dL (ref 0.3–1.2)
Total Protein: 6.5 g/dL (ref 6.5–8.1)

## 2017-02-26 LAB — CBC WITH DIFFERENTIAL/PLATELET
Basophils Absolute: 0.1 10*3/uL (ref 0–0.1)
Basophils Relative: 1 %
EOS ABS: 0.1 10*3/uL (ref 0–0.7)
EOS PCT: 2 %
HEMATOCRIT: 30.7 % — AB (ref 35.0–47.0)
Hemoglobin: 10.1 g/dL — ABNORMAL LOW (ref 12.0–16.0)
LYMPHS ABS: 1 10*3/uL (ref 1.0–3.6)
Lymphocytes Relative: 14 %
MCH: 28.3 pg (ref 26.0–34.0)
MCHC: 32.8 g/dL (ref 32.0–36.0)
MCV: 86.2 fL (ref 80.0–100.0)
MONOS PCT: 5 %
Monocytes Absolute: 0.3 10*3/uL (ref 0.2–0.9)
Neutro Abs: 5.3 10*3/uL (ref 1.4–6.5)
Neutrophils Relative %: 78 %
PLATELETS: 271 10*3/uL (ref 150–440)
RBC: 3.56 MIL/uL — ABNORMAL LOW (ref 3.80–5.20)
RDW: 17.8 % — AB (ref 11.5–14.5)
WBC: 6.8 10*3/uL (ref 3.6–11.0)

## 2017-02-26 LAB — CK: CK TOTAL: 69 U/L (ref 38–234)

## 2017-02-26 MED ORDER — MORPHINE SULFATE (PF) 4 MG/ML IV SOLN
4.0000 mg | Freq: Once | INTRAVENOUS | Status: AC
  Administered 2017-02-26: 4 mg via INTRAVENOUS
  Filled 2017-02-26: qty 6

## 2017-02-26 MED ORDER — OXYCODONE-ACETAMINOPHEN 5-325 MG PO TABS: 1.0000 | ORAL_TABLET | Freq: Four times a day (QID) | ORAL | 0 refills | Status: DC | PRN

## 2017-02-26 MED ORDER — MORPHINE SULFATE (PF) 4 MG/ML IV SOLN
4.0000 mg | Freq: Once | INTRAVENOUS | Status: AC
  Administered 2017-02-26: 4 mg via INTRAVENOUS
  Filled 2017-02-26: qty 1

## 2017-02-26 MED ORDER — IOPAMIDOL (ISOVUE-300) INJECTION 61%
100.0000 mL | Freq: Once | INTRAVENOUS | Status: AC | PRN
  Administered 2017-02-26: 100 mL via INTRAVENOUS

## 2017-02-26 MED ORDER — ONDANSETRON HCL 4 MG/2ML IJ SOLN
4.0000 mg | Freq: Once | INTRAMUSCULAR | Status: AC
  Administered 2017-02-26: 4 mg via INTRAVENOUS
  Filled 2017-02-26: qty 2

## 2017-02-26 NOTE — ED Notes (Signed)
Right hip incision redressed by this RN. Vaseline gauze and ABD applied. Patient tolerated well. Dressing is occlusive.

## 2017-02-26 NOTE — ED Notes (Signed)
Patient transported to CT 

## 2017-02-26 NOTE — ED Provider Notes (Signed)
[facility] Emergency Department Provider Note   ____________________________________________   First MD Initiated Contact with Patient 02/26/17 1148     (approximate)  I have reviewed the triage vital signs and the nursing notes.   HISTORY  Chief Complaint Post-op Problem    HPI [name] [name] is a 51 y.o. female patient had surgery on the 20th to remove hardware from her right hip. Patient complains of increasing pain and cramping and bleeding now. She noted amount of bright red blood in her bathroom and some oozing through the dressing.  Past Medical History:  Diagnosis Date  . ANXIETY 06/06/2007  . Asthma   . BACK PAIN 02/22/2009  . Cervical cancer ([place])   . DEPRESSION 06/06/2007  . Diabetes mellitus without complication ([place])    patient denies  . Essential tremor   . Fibromyalgia   . FREQUENCY, URINARY 07/07/2007  . Gastritis   . GERD 06/06/2007  . Headache   . History of hiatal hernia   . History of kidney stones   . HYPOTHYROIDISM 06/06/2007  . INTERSTITIAL CYSTITIS 09/11/2010  . Lupus 2006  . Lupus   . NEPHROLITHIASIS, HX OF 11/05/2010  . Neuropathy ([place])   . OSTEOPENIA 10/14/2009  . OVERACTIVE BLADDER 10/14/2009  . PAIN, CHRONIC NEC 06/06/2007  . POLYARTHRITIS 08/19/2007  . 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 WEIGHT GAIN 02/22/2009    Patient Active Problem List   Diagnosis Date Noted  . Status post hardware removal 02/16/2017  . Sicca syndrome ([place]) 06/04/2014  . Abdominal pain, epigastric 09/08/2013  . Type II or unspecified type diabetes mellitus without mention of complication, uncontrolled 03/15/2013  . UTI (urinary tract infection) 02/15/2013  . Chronic pain 03/31/2011  . NEPHROLITHIASIS, HX OF 11/05/2010  . INTERSTITIAL CYSTITIS 09/11/2010  . OVERACTIVE BLADDER 10/14/2009  . OSTEOPENIA 10/14/2009  . BACK PAIN 02/22/2009  . WEIGHT GAIN 02/22/2009  . 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 POLYARTHRITIS 08/19/2007    . FREQUENCY, URINARY 07/07/2007  . HYPOTHYROIDISM 06/06/2007  . ANXIETY 06/06/2007  . DEPRESSION 06/06/2007  . PAIN, CHRONIC NEC 06/06/2007  . GERD 06/06/2007    Past Surgical History:  Procedure Laterality Date  . APPENDECTOMY    . HARDWARE REMOVAL Right 02/16/2017   Procedure: HARDWARE REMOVAL FROM HIP;  Surgeon: Hessie Knows, MD;  Location: [facility] ORS;  Service: Orthopedics;  Laterality: Right;  . JOINT REPLACEMENT    . [place] ARTHROPLASTY  06-2009  . TONSILLECTOMY    . TONSILLECTOMY    . WRIST RECONSTRUCTION      Prior to Admission medications   Medication Sig Start Date End Date Taking? Authorizing Provider  albuterol (PROVENTIL HFA) 108 (90 BASE) MCG/ACT inhaler Inhale 2 puffs into the lungs every 4 (four) hours as needed for wheezing or shortness of breath.     Historical Provider, MD  ARIPiprazole (ABILIFY) 20 MG tablet Take 20 mg by mouth daily.    Historical Provider, MD  Ascorbic Acid (VITAMIN C) 500 MG CHEW Chew 2,500 mg by mouth daily.    Historical Provider, MD  aspirin 325 MG tablet Take 1 tablet (325 mg total) by mouth daily. 02/18/17   Duanne Guess, PA-C  aspirin EC 325 MG tablet Take 325 mg by mouth daily.    Historical Provider, MD  Biotin 5000 MCG SUBL Place 10,000 mcg under the tongue daily.    Historical Provider, MD  Calcium-Phosphorus-Vitamin D (CALCIUM GUMMIES PO) Take 2 tablets by mouth daily.  Historical Provider, MD  dapsone 25 MG tablet Take 25 mg by mouth every morning.     Historical Provider, MD  diclofenac sodium (VOLTAREN) 1 % GEL Apply 2 g topically 4 (four) times daily as needed (for knee & finger pain.).     Historical Provider, MD  DULoxetine (CYMBALTA) 60 MG capsule Take 60 mg by mouth 2 (two) times daily.    Historical Provider, MD  fentaNYL (DURAGESIC - DOSED MCG/HR) 75 MCG/HR Place 75 mcg onto the skin every 3 (three) days.    Historical Provider, MD  fluconazole (DIFLUCAN) 150 MG tablet Take 150 mg by mouth See admin  instructions. Take 1 tablet on 01/29/17, then take 1 tablet in 3 days, & then take final dose 7 days later.    Historical Provider, MD  folic acid (FOLVITE) 1 MG tablet Take 1 mg by mouth daily.    Historical Provider, MD  hydroxychloroquine (PLAQUENIL) 200 MG tablet Take 200 mg by mouth 2 (two) times daily.    Historical Provider, MD  ibuprofen (ADVIL,MOTRIN) 200 MG tablet Take 600 mg by mouth every 8 (eight) hours as needed (for pain/headaches.).    Historical Provider, MD  levothyroxine (SYNTHROID, LEVOTHROID) 25 MCG tablet Take 1 tablet (25 mcg total) by mouth daily before breakfast. 03/15/14   Marletta Lor, MD  LORazepam (ATIVAN) 2 MG tablet Take 1-2 mg by mouth See admin instructions. 1 MG DAILY AS NEEDED FOR ANXIETY & SCHEDULED AT BEDTIME EACH NIGHT    Historical Provider, MD  methylphenidate 18 MG PO CR tablet Take 18 mg by mouth 2 (two) times daily. 0500 & 1400    Historical Provider, MD  montelukast (SINGULAIR) 10 MG tablet Take 10 mg by mouth daily.    Historical Provider, MD  ondansetron (ZOFRAN-ODT) 4 MG disintegrating tablet Take 4 mg by mouth every 8 (eight) hours as needed (for nausea/vomiting).  07/03/14   Historical Provider, MD  oxyCODONE-acetaminophen (PERCOCET/ROXICET) 5-325 MG tablet Take 1 tablet by mouth every 6 (six) hours as needed (for pain.).    Historical Provider, MD  oxyCODONE-acetaminophen (PERCOCET/ROXICET) 5-325 MG tablet Take 1-2 tablets by mouth every 4 (four) hours as needed for moderate pain. 02/17/17   Duanne Guess, PA-C  pantoprazole (PROTONIX) 40 MG tablet Take one tablet by mouth twice daily Patient taking differently: TAKE 1 TABLET (40 MG) BY MOUTH ONCE DAILY IN THE MORNING. 12/18/13   Marletta Lor, MD  Polyvinyl Alcohol-Povidone (REFRESH OP) Place 1 drop into both eyes 2 (two) times daily.    Historical Provider, MD  predniSONE (DELTASONE) 5 MG tablet Take 10 mg by mouth daily with breakfast.    Historical Provider, MD    sulfamethoxazole-trimethoprim (BACTRIM DS,SEPTRA DS) 800-160 MG tablet Take 1 tablet by mouth 2 (two) times daily. 14 day therapy course patient began on 01/29/17    Historical Provider, MD  topiramate (TOPAMAX) 100 MG tablet Take one tablet by mouth twice daily Patient taking differently: Take 100 mg by mouth 2 (two) times daily.  03/15/14   Marletta Lor, MD  triamcinolone cream (KENALOG) 0.1 % Apply 1 application topically 2 (two) times daily. Patient taking differently: Apply 1 application topically 2 (two) times daily as needed (for rash/itchy skin.).  06/04/14   Marletta Lor, MD    Allergies Latex; Clarithromycin; Compazine [prochlorperazine edisylate]; Dhea [nutritional supplements]; Prasterone; Promethazine hcl; Fish allergy; Hydromorphone hcl; and Penicillins  Family History  Problem Relation Age of Onset  . Hypertension Mother   . Arthritis  Mother   . Asthma Sister   . Asthma Daughter   . Pancreatic cancer Father   . Diabetes Maternal Grandmother   . Heart disease Maternal Grandmother   . Colon cancer Maternal Uncle 39  . Crohn's disease Maternal Aunt   . Diabetes Maternal Aunt     Social History Social History  Substance Use Topics  . Smoking status: Never Smoker  . Smokeless tobacco: Never Used  . Alcohol use No    Review of Systems Constitutional: No fever/chills Eyes: No visual changes. ENT: No sore throat. Cardiovascular: Denies chest pain. Respiratory: Denies shortness of breath. Gastrointestinal: No abdominal pain.  No nausea, no vomiting.  No diarrhea.  No constipation. Genitourinary: Negative for dysuria. Musculoskeletal: Negative for back pain. Skin: Negative for rash. Neurological: Negative for headaches, focal weakness or numbness.  10-point ROS otherwise negative.  ____________________________________________   PHYSICAL EXAM:  VITAL SIGNS: ED Triage Vitals  Enc Vitals Group     BP 02/26/17 1125 (!) 115/55     Pulse Rate  02/26/17 1125 (!) 102     Resp 02/26/17 1125 18     Temp 02/26/17 1125 98.5 F (36.9 C)     Temp Source 02/26/17 1125 Oral     SpO2 02/26/17 1125 96 %     Weight 02/26/17 1126 212 lb (96.2 kg)     Height 02/26/17 1126 5\' 10"  (1.778 m)     Head Circumference --      Peak Flow --      Pain Score 02/26/17 1125 8     Pain Loc --      Pain Edu? --      Excl. in [place]? --     Constitutional: Alert and oriented. Well appearing and in no acute distress. Eyes: Conjunctivae are normal. PERRL. EOMI. Head: Atraumatic. Nose: No congestion/rhinnorhea. Mouth/Throat: Mucous membranes are moist.  Oropharynx non-erythematous. Neck: No stridor.  Cardiovascular: Normal rate, regular rhythm.    Good peripheral circulation. Respiratory: Normal respiratory effort.  No retractions.  Gastrointestinal: Soft and nontender. No distention. Musculoskeletal: Area around right hip incision is red swollen tender more swollen and red there is some discharge and is oozing of serosanguineous fluid on the dressing and Tylenol underneath of it. Neurologic:  Normal speech and language. No gross focal neurologic deficits are appreciated. No gait instability. Skin:  Skin is warm, dry and intact. No rash noted.   ____________________________________________   LABS (all labs ordered are listed, but only abnormal results are displayed)  Labs Reviewed  COMPREHENSIVE METABOLIC PANEL - Abnormal; Notable for the following:       Result Value   Glucose, Bld 109 (*)    All other components within normal limits  CBC WITH DIFFERENTIAL/PLATELET - Abnormal; Notable for the following:    RBC 3.56 (*)    Hemoglobin 10.1 (*)    HCT 30.7 (*)    RDW 17.8 (*)    All other components within normal limits  CK   ____________________________________________  EKG   ____________________________________________  RADIOLOGY Study Result   CLINICAL DATA:  Status post right hip surgery and 323 move hardware from prior hip repair.  Noted bright red bleeding from surgical site and swelling currently without active hemorrhage.  EXAM: CT OF THE LOWER RIGHT EXTREMITY WITH CONTRAST  TECHNIQUE: Multidetector CT imaging of the lower right extremity was performed according to the standard protocol following intravenous contrast administration.  COMPARISON:  None.  CONTRAST:  157mL ISOVUE-300 IOPAMIDOL (ISOVUE-300) INJECTION 61%  FINDINGS:  Bones/Joint/Cartilage  Ghost tracks from dynamic compression screw removal are identified along the proximal right femur with small 1 mm through 8 mm ossific densities lateral to the femoral shaft and greater trochanter believed to be associated with hardware removal. No fracture identified. Right hip joint is maintained without joint effusion.  Ligaments  Suboptimally assessed by CT.  Muscles, Soft tissues and Tendons  No evidence of active hemorrhage. No intramuscular hematoma. There is fluid associated with the right vastus lateralis muscle associated with a band like hypodense fluid collection tracking to the lateral aspect of the right thigh consistent with postoperative serosanguineous fluid. This measures 12 x 4.2 x 13.9 cm in transverse by AP by craniocaudad dimension.  IMPRESSION: 1. Hypodense bandlike/sheet of postoperative fluid lateral to the proximal femur spanning the expected length of the femoral dynamic compression screw plate recently removed. No hyperdense fluid to suggest active hemorrhage. No fluid involves the lateral aspect of the vastus lateralis muscle and extends to skin surface. 2. Ghost tracks from dynamic compression screw removal with adjacent innumerable small ossific densities ranging size from 1 mm and 8 mm lateral to proximal femur.   Electronically Signed   By: Ashley Royalty M.D.   On: 02/26/2017 14:21     ____________________________________________   PROCEDURES  Procedure(s) performed:    Procedures  Critical Care performed:   ____________________________________________   INITIAL IMPRESSION / ASSESSMENT AND PLAN / ED COURSE  Pertinent labs & imaging results that were available during my care of the patient were reviewed by me and considered in my medical decision making (see chart for details).   Chest in detail with Dr. Marry Guan who reviewed the films     ____________________________________________   FINAL CLINICAL IMPRESSION(S) / ED DIAGNOSES  Final diagnoses:  Post-operative pain      NEW MEDICATIONS STARTED DURING THIS VISIT:  New Prescriptions   No medications on file     Note:  This document was prepared using Dragon voice recognition software and may include unintentional dictation errors.    Nena Polio, MD 02/26/17 [phone]

## 2017-02-26 NOTE — ED Notes (Signed)
Patient had surgery on April 10th. Prior to today patient has had no complications. 20 minutes PTA patient noticed bright red blood "pouring out". Slight serosanguinous drainage noted.

## 2017-02-26 NOTE — ED Notes (Signed)
Attempted IV access x 2. Unsuccessful. 

## 2017-02-26 NOTE — ED Triage Notes (Signed)
Had R hip surgery 3/20 to remove hardware from previous hip repair. Today noted sudden bright red bleeding from surgical site. States large amount of bright red was noted in her bathroom. Now has heavy dressing on with no bleed through noted. States takes one 325mg  ASA per day. No other blood thinners.

## 2017-02-26 NOTE — Discharge Instructions (Signed)
Use ice as directed. Do not use both the ibuprofen and the Voltaren use one of the other but not both. I will give you some more Percocet you can take 14 times a day if needed for severe pain. Continue moving around but do not do anything strenuous. Give Dr. Melburn Popper call Monday morning to arrange for further follow-up. Return for increased pain swelling fever or redness.

## 2017-03-02 DIAGNOSIS — G8929 Other chronic pain: Secondary | ICD-10-CM | POA: Diagnosis not present

## 2017-03-02 DIAGNOSIS — M329 Systemic lupus erythematosus, unspecified: Secondary | ICD-10-CM | POA: Diagnosis not present

## 2017-03-02 DIAGNOSIS — G8918 Other acute postprocedural pain: Secondary | ICD-10-CM | POA: Diagnosis not present

## 2017-03-02 DIAGNOSIS — Z0279 Encounter for issue of other medical certificate: Secondary | ICD-10-CM | POA: Diagnosis not present

## 2017-03-09 DIAGNOSIS — Z9889 Other specified postprocedural states: Secondary | ICD-10-CM | POA: Diagnosis not present

## 2017-03-19 ENCOUNTER — Encounter: Payer: Self-pay | Admitting: Obstetrics and Gynecology

## 2017-03-31 ENCOUNTER — Encounter: Payer: Self-pay | Admitting: Obstetrics and Gynecology

## 2017-03-31 ENCOUNTER — Ambulatory Visit (INDEPENDENT_AMBULATORY_CARE_PROVIDER_SITE_OTHER): Payer: Medicare Other | Admitting: Obstetrics and Gynecology

## 2017-03-31 VITALS — BP 136/74 | HR 103 | Ht 70.0 in | Wt 224.8 lb

## 2017-03-31 DIAGNOSIS — N952 Postmenopausal atrophic vaginitis: Secondary | ICD-10-CM

## 2017-03-31 DIAGNOSIS — K640 First degree hemorrhoids: Secondary | ICD-10-CM

## 2017-03-31 DIAGNOSIS — N951 Menopausal and female climacteric states: Secondary | ICD-10-CM

## 2017-03-31 DIAGNOSIS — N8110 Cystocele, unspecified: Secondary | ICD-10-CM | POA: Diagnosis not present

## 2017-03-31 MED ORDER — PAROXETINE MESYLATE 7.5 MG PO CAPS: 1.0000 | ORAL_CAPSULE | Freq: Every day | ORAL | 1 refills | Status: DC

## 2017-03-31 NOTE — Patient Instructions (Addendum)
Kegel Exercises Kegel exercises help strengthen the muscles that support the rectum, vagina, small intestine, bladder, and uterus. Doing Kegel exercises can help:  Improve bladder and bowel control.  Improve sexual response.  Reduce problems and discomfort during pregnancy. Kegel exercises involve squeezing your pelvic floor muscles, which are the same muscles you squeeze when you try to stop the flow of urine. The exercises can be done while sitting, standing, or lying down, but it is best to vary your position. Phase 1 exercises 1. Squeeze your pelvic floor muscles tight. You should feel a tight lift in your rectal area. If you are a female, you should also feel a tightness in your vaginal area. Keep your stomach, buttocks, and legs relaxed. 2. Hold the muscles tight for up to 10 seconds. 3. Relax your muscles. Repeat this exercise 50 times a day or as many times as told by your health care provider. Continue to do this exercise for at least 4-6 weeks or for as long as told by your health care provider. This information is not intended to replace advice given to you by your health care provider. Make sure you discuss any questions you have with your health care provider. Document Released: 11/02/2012 Document Revised: 07/11/2016 Document Reviewed: 10/06/2015 Elsevier Interactive Patient Education  2017 [employer].     Menopause Menopause is the normal time of life when menstrual periods stop completely. Menopause is complete when you have missed 12 consecutive menstrual periods. It usually occurs between the ages of 64 years and 72 years. Very rarely does a woman develop menopause before the age of 31 years. At menopause, your ovaries stop producing the female hormones estrogen and progesterone. This can cause undesirable symptoms and also affect your health. Sometimes the symptoms may occur 4-5 years before the menopause begins. There is no relationship between menopause and:  Oral  contraceptives.  Number of children you had.  Race.  The age your menstrual periods started (menarche). Heavy smokers and very thin women may develop menopause earlier in life. What are the causes?  The ovaries stop producing the female hormones estrogen and progesterone. Other causes include:  Surgery to remove both ovaries.  The ovaries stop functioning for no known reason.  Tumors of the pituitary gland in the brain.  Medical disease that affects the ovaries and hormone production.  Radiation treatment to the abdomen or pelvis.  Chemotherapy that affects the ovaries. What are the signs or symptoms?  Hot flashes.  Night sweats.  Decrease in sex drive.  Vaginal dryness and thinning of the vagina causing painful intercourse.  Dryness of the skin and developing wrinkles.  Headaches.  Tiredness.  Irritability.  Memory problems.  Weight gain.  Bladder infections.  Hair growth of the face and chest.  Infertility. More serious symptoms include:  Loss of bone (osteoporosis) causing breaks (fractures).  Depression.  Hardening and narrowing of the arteries (atherosclerosis) causing heart attacks and strokes. How is this diagnosed?  When the menstrual periods have stopped for 12 straight months.  Physical exam.  Hormone studies of the blood. How is this treated? There are many treatment choices and nearly as many questions about them. The decisions to treat or not to treat menopausal changes is an individual choice made with your health care provider. Your health care provider can discuss the treatments with you. Together, you can decide which treatment will work best for you. Your treatment choices may include:  Hormone therapy (estrogen and progesterone).  Non-hormonal medicines.  Treating  the individual symptoms with medicine (for example antidepressants for depression).  Herbal medicines that may help specific symptoms.  Counseling by a  psychiatrist or psychologist.  Group therapy.  Lifestyle changes including:  Eating healthy.  Regular exercise.  Limiting caffeine and alcohol.  Stress management and meditation.  No treatment. Follow these instructions at home:  Take the medicine your health care provider gives you as directed.  Get plenty of sleep and rest.  Exercise regularly.  Eat a diet that contains calcium (good for the bones) and soy products (acts like estrogen hormone).  Avoid alcoholic beverages.  Do not smoke.  If you have hot flashes, dress in layers.  Take supplements, calcium, and vitamin D to strengthen bones.  You can use over-the-counter lubricants or moisturizers for vaginal dryness.  Group therapy is sometimes very helpful.  Acupuncture may be helpful in some cases. Contact a health care provider if:  You are not sure you are in menopause.  You are having menopausal symptoms and need advice and treatment.  You are still having menstrual periods after age 33 years.  You have pain with intercourse.  Menopause is complete (no menstrual period for 12 months) and you develop vaginal bleeding.  You need a referral to a specialist (gynecologist, psychiatrist, or psychologist) for treatment. Get help right away if:  You have severe depression.  You have excessive vaginal bleeding.  You fell and think you have a broken bone.  You have pain when you urinate.  You develop leg or chest pain.  You have a fast pounding heart beat (palpitations).  You have severe headaches.  You develop vision problems.  You feel a lump in your breast.  You have abdominal pain or severe indigestion. This information is not intended to replace advice given to you by your health care provider. Make sure you discuss any questions you have with your health care provider. Document Released: 02/06/2004 Document Revised: 04/23/2016 Document Reviewed: 06/15/2013 Elsevier Interactive Patient  Education  2017 [employer].

## 2017-03-31 NOTE — Progress Notes (Signed)
GYNECOLOGY CLINIC PROGRESS NOTE  Subjective:     [name] [name] is a 51 y.o. G1P1 menopausal female here for vaginal prolapse.  Patient states she was referred by her Orthopedic Surgeon after having a hip replacement (states he may have noted it while placing her catheter for the procedure).  Patient states that she was unaware of the presence of prolapse, but has occasionally noted a small bulge in the back vaginal region.  Notes she thought these were hemorrhoids.    Gynecologic History Patient's last menstrual period was 01/02/2016 (exact date). Contraception: abstinence Last Pap: 2 or 3 years ago (patient cannot recall). Results were: normal Last mammogram: 2016. Results were: normal  Obstetric History OB History  Gravida Para Term Preterm AB Living  1 1       1   SAB TAB Ectopic Multiple Live Births          1    # Outcome Date GA Lbr Len/2nd Weight Sex Delivery Anes PTL Lv  1 Para 06/19/98    F Vag-Spont   LIV      Past Medical History:  Diagnosis Date  . ANXIETY 06/06/2007  . Asthma   . BACK PAIN 02/22/2009  . Cervical cancer ([place])   . DEPRESSION 06/06/2007  . Diabetes mellitus without complication ([place])    patient denies  . Essential tremor   . Fibromyalgia   . FREQUENCY, URINARY 07/07/2007  . Gastritis   . GERD 06/06/2007  . Headache   . History of hiatal hernia   . History of kidney stones   . HYPOTHYROIDISM 06/06/2007  . INTERSTITIAL CYSTITIS 09/11/2010  . Lupus 2006  . Lupus   . NEPHROLITHIASIS, HX OF 11/05/2010  . Neuropathy   . OSTEOPENIA 10/14/2009  . OVERACTIVE BLADDER 10/14/2009  . PAIN, CHRONIC NEC 06/06/2007  . POLYARTHRITIS 08/19/2007  . 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 WEIGHT GAIN 02/22/2009    Family History  Problem Relation Age of Onset  . Hypertension Mother   . Arthritis Mother   . Asthma Sister   . Asthma Daughter   . Pancreatic cancer Father   . Diabetes Maternal Grandmother   . Heart disease Maternal Grandmother   . Colon  cancer Maternal Uncle 42  . Crohn's disease Maternal Aunt   . Diabetes Maternal Aunt     Past Surgical History:  Procedure Laterality Date  . APPENDECTOMY    . HARDWARE REMOVAL Right 02/16/2017   Procedure: HARDWARE REMOVAL FROM HIP;  Surgeon: Hessie Knows, MD;  Location: [facility] ORS;  Service: Orthopedics;  Laterality: Right;  . JOINT REPLACEMENT    . [place] ARTHROPLASTY  06-2009  . TONSILLECTOMY    . TONSILLECTOMY    . WRIST RECONSTRUCTION      Social History   Social History  . Marital status: Divorced    Spouse name: N/A  . Number of children: 1  . Years of education: N/A   Occupational History  . DISABLED Unemployed   Social History Main Topics  . Smoking status: Never Smoker  . Smokeless tobacco: Never Used  . Alcohol use No  . Drug use: No  . Sexual activity: Not Currently    Birth control/ protection: None, Post-menopausal   Other Topics Concern  . 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   She lives with daughter (8) and mother (40).  Current Outpatient Prescriptions on File Prior to Visit  Medication Sig Dispense Refill  . albuterol (PROVENTIL HFA) 108 (90 BASE) MCG/ACT inhaler Inhale 2 puffs into the lungs every 4 (four) hours as needed for wheezing or shortness of breath.     . ARIPiprazole (ABILIFY) 20 MG tablet Take 20 mg by mouth daily.    . Ascorbic Acid (VITAMIN C) 500 MG CHEW Chew 2,500 mg by mouth daily.    Marland Kitchen aspirin 325 MG tablet Take 1 tablet (325 mg total) by mouth daily. 30 tablet 0  . Biotin 5000 MCG SUBL Place 10,000 mcg under the tongue daily.    . DULoxetine (CYMBALTA) 60 MG capsule Take 60 mg by mouth 2 (two) times daily.    . fentaNYL (DURAGESIC - DOSED MCG/HR) 75 MCG/HR Place 75 mcg onto the skin every 3 (three) days.    . folic acid (FOLVITE) 1 MG tablet Take 1 mg by mouth daily.    . hydroxychloroquine (PLAQUENIL) 200 MG tablet Take 200 mg  by mouth 2 (two) times daily.    Marland Kitchen levothyroxine (SYNTHROID, LEVOTHROID) 25 MCG tablet Take 1 tablet (25 mcg total) by mouth daily before breakfast. 90 tablet 1  . LORazepam (ATIVAN) 2 MG tablet Take 1-2 mg by mouth See admin instructions. 1 MG DAILY AS NEEDED FOR ANXIETY & SCHEDULED AT BEDTIME EACH NIGHT    . methylphenidate 18 MG PO CR tablet Take 18 mg by mouth 2 (two) times daily. 0500 & 1400    . montelukast (SINGULAIR) 10 MG tablet Take 10 mg by mouth daily.    . ondansetron (ZOFRAN-ODT) 4 MG disintegrating tablet Take 4 mg by mouth every 8 (eight) hours as needed (for nausea/vomiting).     Marland Kitchen oxyCODONE-acetaminophen (PERCOCET/ROXICET) 5-325 MG tablet Take 1-2 tablets by mouth every 4 (four) hours as needed for moderate pain. 40 tablet 0  . pantoprazole (PROTONIX) 40 MG tablet Take one tablet by mouth twice daily (Patient taking differently: TAKE 1 TABLET (40 MG) BY MOUTH ONCE DAILY IN THE MORNING.) 180 tablet 3  . Polyvinyl Alcohol-Povidone (REFRESH OP) Place 1 drop into both eyes 2 (two) times daily.    . predniSONE (DELTASONE) 5 MG tablet Take 10 mg by mouth daily with breakfast.    . topiramate (TOPAMAX) 100 MG tablet Take one tablet by mouth twice daily (Patient taking differently: Take 100 mg by mouth 2 (two) times daily. ) 180 tablet 1  . triamcinolone cream (KENALOG) 0.1 % Apply 1 application topically 2 (two) times daily. (Patient taking differently: Apply 1 application topically 2 (two) times daily as needed (for rash/itchy skin.). ) 85.2 g 1  . diclofenac sodium (VOLTAREN) 1 % GEL Apply 2 g topically 4 (four) times daily as needed (for knee & finger pain.).      No current facility-administered medications on file prior to visit.     Allergies  Allergen Reactions  . Latex Anaphylaxis  . Clarithromycin Hives  . Compazine [Prochlorperazine Edisylate] Other (See Comments)    tremors  . Dhea [Nutritional Supplements] Other (See Comments)    Headaches.  . Prasterone Other (See  Comments)    Headaches.  . Promethazine Hcl Other (See Comments)    CNS disorder  . Fish Allergy Hives, Swelling and Rash    Facial swelling   . Hydromorphone Hcl Rash and Hives    "Rash all over"  . Penicillins Rash and Other (See Comments)    Has patient had a PCN reaction causing immediate rash, facial/tongue/throat  swelling, SOB or lightheadedness with hypotension:No Has patient had a PCN reaction causing severe rash involving mucus membranes or skin necrosis:No Has patient had a PCN reaction that required hospitalization:No Has patient had a PCN reaction occurring within the last 10 years:No If all of the above answers are "NO", then may proceed with Cephalosporin use.    Review of Systems A comprehensive review of systems was negative except for: Genitourinary: positive for hot flashes  (10-20 times daily) and associated  dizziness.    Objective:    BP 136/74 (BP Location: Left Arm, Patient Position: Sitting, Cuff Size: Large)   Pulse (!) 103   Ht 5\' 10"  (1.778 m)   Wt 224 lb 12.8 oz (102 kg)   LMP 01/02/2016 (Exact Date)   BMI 32.26 kg/m  General appearance: alert and no distress Abdomen: soft, non-tender; bowel sounds normal; no masses,  no organomegaly Pelvic: external genitalia normal, rectovaginal septum normal.  Vagina mildly atrophic, without discharge.  Grade 1 cystocele present. Cervix normal appearing, no lesions and no motion tenderness.  Uterus mobile, nontender, normal shape and size.  Adnexae non-palpable, nontender bilaterally.  Rectal: External hemorrhoids present, normal sphincter tone Extremities: extremities normal, atraumatic, no cyanosis or edema.  Uses walker for ambulatory assistance Neurologic: Grossly normal  except with essential tremor  Assessment:   Grade 1 cystocele Vasomotor symptoms Vaginal atrophy (mild) Hemorrhoids  Plan:   1. Grade 1 cystocele - patient relatively asymptomatic. Discussed pelvic organ prolapse. Discussed use of Kegel  exercises. It is not a candidate for surgical management or pessary at this time.  2. Vasomotor symptoms - Patient with bothersome menopausal vasomotor symptoms. Discussed lifestyle interventions such as wearing light clothing, remaining in cool environments, having fan/air conditioner in the room, avoiding hot beverages etc.  Discussed using hormone therapy and concerns about increased risk of heart disease, cerebrovascular disease, thromboembolic disease,  and breast cancer.  Also discussed other medical options such as Paxil, Effexor or Neurontin.   Also discussed alternative therapies such as herbal remedies but cautioned that most of the products contained phytoestrogens (plant estrogens) in unregulated amounts which can have the same effects on the body as the pharmaceutical estrogen preparations.  Also referred her to www.menopause.org for other alternative options. Patient desires something that will not interfere with her lupus medications. She opted for nonhormonal management with Paxil. Patient to return in 4 weeks to reassess symptoms. 3. Vaginal atrophy - mild atrophy noted on exam today. Patient without vaginal irritation and discomfort. Is currently not sexually active. Symptoms are not bothersome at this time and patient declines treatment. 4. Hemorrhoids - patient has received treatment from her PCP including a rectal cream for use. Encouraged patient to continue use, encourage sitz bath as well as increasing fiber in diet.   RTC in 4 weeks.    A total of 30 minutes were spent face-to-face with the patient during this encounter and over half of that time dealt with counseling and coordination of care.   Rubie Maid, MD [facility]

## 2017-04-01 DIAGNOSIS — G894 Chronic pain syndrome: Secondary | ICD-10-CM | POA: Diagnosis not present

## 2017-04-01 DIAGNOSIS — J45909 Unspecified asthma, uncomplicated: Secondary | ICD-10-CM | POA: Diagnosis not present

## 2017-04-12 DIAGNOSIS — G479 Sleep disorder, unspecified: Secondary | ICD-10-CM | POA: Diagnosis not present

## 2017-04-12 DIAGNOSIS — F3181 Bipolar II disorder: Secondary | ICD-10-CM | POA: Diagnosis not present

## 2017-04-14 DIAGNOSIS — Z9889 Other specified postprocedural states: Secondary | ICD-10-CM | POA: Diagnosis not present

## 2017-04-19 DIAGNOSIS — F3181 Bipolar II disorder: Secondary | ICD-10-CM | POA: Diagnosis not present

## 2017-04-27 ENCOUNTER — Ambulatory Visit (INDEPENDENT_AMBULATORY_CARE_PROVIDER_SITE_OTHER): Payer: Medicare Other | Admitting: Obstetrics and Gynecology

## 2017-04-27 VITALS — BP 109/65 | HR 102 | Ht 70.0 in | Wt 226.3 lb

## 2017-04-27 DIAGNOSIS — L989 Disorder of the skin and subcutaneous tissue, unspecified: Secondary | ICD-10-CM | POA: Diagnosis not present

## 2017-04-27 DIAGNOSIS — N951 Menopausal and female climacteric states: Secondary | ICD-10-CM | POA: Diagnosis not present

## 2017-04-27 NOTE — Progress Notes (Signed)
    GYNECOLOGY PROGRESS NOTE  Subjective:    [name], female    DOB: 1966-10-25, 51 y.o.   MRN: [id number]  HPI  Patient is a 51 y.o. G1P1 female who presents for f/u of vasomotor symptoms.  Patient was prescribed Paxil last visit, however notes that she did not take it as she had a friend who told her about certain side effects of the medication (such as malignant hyperthermia) which scared her out of taking the medication.   The following portions of the patient's history were reviewed and updated as appropriate: allergies, current medications, past family history, past medical history, past social history, past surgical history and problem list.  Review of Systems Genitourinary:positive for hot flashes and and night sweats Integument/breast: positive for skin lesion(s) in axillary region, tender, scaling, and changing in color. Has been present for several weeks. Denies recent shaving or irritation to areas. Began at left axillary region, then progressed to the right.  Hurts to put on deodorant.   Objective:   Blood pressure 109/65, pulse (!) 102, height 5\' 10"  (1.778 m), weight 226 lb 4.8 oz (102.6 kg), last menstrual period 01/02/2016. General appearance: alert and no distress Skin: Axillary region with several scaly papules present, surrounded by hyperpigmented skin.    Assessment:   Menopausal vasomotor symptoms Axillary skin lesion  Plan:   - Discussed medication Paxil, including side effects. Discussed that the medication prescribed was low dose, and that an interaction check had been performed prior to prescribing the medication to check against her current medications, and that malignant hypothermia was not a concern.  Also discussed alternatives to Paxil, including other non-hormonal HRT therapy, hormonal HRT, and herbal supplements, if patient desired to consider.  Patient just noted that she needed reassurance, but is now ok to begin the Paxil.   Will f/u in 3-4 weeks to reassess symptoms.  - Patient desires referral for Dermatology for skin lesion.  Will place referral, but advised patient to also f/u with PCP as it may take some time to be seen by a Dermatologist due to busy scheduling.   A total of 15 minutes were spent face-to-face with the patient during this encounter and over half of that time dealt with counseling and coordination of care.   Rubie Maid, MD [facility]

## 2017-04-28 DIAGNOSIS — F3181 Bipolar II disorder: Secondary | ICD-10-CM | POA: Diagnosis not present

## 2017-05-03 DIAGNOSIS — D485 Neoplasm of uncertain behavior of skin: Secondary | ICD-10-CM | POA: Diagnosis not present

## 2017-05-10 ENCOUNTER — Telehealth: Payer: Self-pay | Admitting: Obstetrics and Gynecology

## 2017-05-10 NOTE — Telephone Encounter (Signed)
I have not received a prior auth, when I receive it I will be happy to complete it.

## 2017-05-10 NOTE — Telephone Encounter (Signed)
Patient called stating her insurance will not cover Paroxetine mesylate 7.5 without a prior auth. Thanks

## 2017-05-24 DIAGNOSIS — F3181 Bipolar II disorder: Secondary | ICD-10-CM | POA: Diagnosis not present

## 2017-05-24 DIAGNOSIS — Z79899 Other long term (current) drug therapy: Secondary | ICD-10-CM | POA: Diagnosis not present

## 2017-05-24 DIAGNOSIS — H04123 Dry eye syndrome of bilateral lacrimal glands: Secondary | ICD-10-CM | POA: Diagnosis not present

## 2017-05-25 ENCOUNTER — Encounter: Payer: Medicare Other | Admitting: Obstetrics and Gynecology

## 2017-05-25 DIAGNOSIS — Z9225 Personal history of immunosupression therapy: Secondary | ICD-10-CM | POA: Diagnosis not present

## 2017-05-25 DIAGNOSIS — E038 Other specified hypothyroidism: Secondary | ICD-10-CM | POA: Diagnosis not present

## 2017-05-25 DIAGNOSIS — M359 Systemic involvement of connective tissue, unspecified: Secondary | ICD-10-CM | POA: Diagnosis not present

## 2017-05-25 DIAGNOSIS — T380X5A Adverse effect of glucocorticoids and synthetic analogues, initial encounter: Secondary | ICD-10-CM | POA: Diagnosis not present

## 2017-05-25 DIAGNOSIS — M3500 Sicca syndrome, unspecified: Secondary | ICD-10-CM | POA: Diagnosis not present

## 2017-05-25 DIAGNOSIS — Z79899 Other long term (current) drug therapy: Secondary | ICD-10-CM | POA: Diagnosis not present

## 2017-05-25 DIAGNOSIS — Z5181 Encounter for therapeutic drug level monitoring: Secondary | ICD-10-CM | POA: Diagnosis not present

## 2017-05-25 DIAGNOSIS — M8580 Other specified disorders of bone density and structure, unspecified site: Secondary | ICD-10-CM | POA: Diagnosis not present

## 2017-05-25 DIAGNOSIS — M818 Other osteoporosis without current pathological fracture: Secondary | ICD-10-CM | POA: Diagnosis not present

## 2017-05-25 DIAGNOSIS — M80051G Age-related osteoporosis with current pathological fracture, right femur, subsequent encounter for fracture with delayed healing: Secondary | ICD-10-CM | POA: Diagnosis not present

## 2017-05-25 DIAGNOSIS — E559 Vitamin D deficiency, unspecified: Secondary | ICD-10-CM | POA: Diagnosis not present

## 2017-05-26 ENCOUNTER — Telehealth: Payer: Self-pay

## 2017-05-26 DIAGNOSIS — N951 Menopausal and female climacteric states: Secondary | ICD-10-CM

## 2017-05-26 MED ORDER — PAROXETINE HCL 10 MG PO TABS: 10.0000 mg | ORAL_TABLET | Freq: Every day | ORAL | 2 refills | Status: DC

## 2017-05-26 NOTE — Telephone Encounter (Signed)
Per Dr. Marcelline Mates rx sent for Paxil as substitution.

## 2017-05-26 NOTE — Telephone Encounter (Signed)
Pt calls and states that her medication Paroxetine is not covered. Called pharmacy and they state that the medication is not formulary for pt's insurance. Please advise.

## 2017-05-28 ENCOUNTER — Other Ambulatory Visit: Payer: Self-pay | Admitting: Internal Medicine

## 2017-05-28 DIAGNOSIS — M818 Other osteoporosis without current pathological fracture: Secondary | ICD-10-CM

## 2017-05-28 DIAGNOSIS — T380X5A Adverse effect of glucocorticoids and synthetic analogues, initial encounter: Principal | ICD-10-CM

## 2017-05-28 DIAGNOSIS — E559 Vitamin D deficiency, unspecified: Secondary | ICD-10-CM

## 2017-05-28 DIAGNOSIS — M359 Systemic involvement of connective tissue, unspecified: Secondary | ICD-10-CM

## 2017-05-28 DIAGNOSIS — Z5181 Encounter for therapeutic drug level monitoring: Secondary | ICD-10-CM

## 2017-05-28 DIAGNOSIS — E063 Autoimmune thyroiditis: Secondary | ICD-10-CM

## 2017-05-28 DIAGNOSIS — Z79899 Other long term (current) drug therapy: Secondary | ICD-10-CM

## 2017-05-28 DIAGNOSIS — E038 Other specified hypothyroidism: Secondary | ICD-10-CM

## 2017-05-28 DIAGNOSIS — M8580 Other specified disorders of bone density and structure, unspecified site: Secondary | ICD-10-CM

## 2017-05-28 DIAGNOSIS — M80051G Age-related osteoporosis with current pathological fracture, right femur, subsequent encounter for fracture with delayed healing: Secondary | ICD-10-CM

## 2017-06-01 ENCOUNTER — Encounter: Payer: Self-pay | Admitting: Obstetrics and Gynecology

## 2017-06-01 ENCOUNTER — Ambulatory Visit (INDEPENDENT_AMBULATORY_CARE_PROVIDER_SITE_OTHER): Payer: Medicare Other | Admitting: Obstetrics and Gynecology

## 2017-06-01 VITALS — BP 111/66 | HR 102 | Ht 70.0 in | Wt 230.0 lb

## 2017-06-01 DIAGNOSIS — N951 Menopausal and female climacteric states: Secondary | ICD-10-CM | POA: Diagnosis not present

## 2017-06-01 NOTE — Progress Notes (Signed)
    GYNECOLOGY PROGRESS NOTE  Subjective:    [name], female    DOB: 07-04-1966, 51 y.o.   MRN: [id number]  HPI  Patient is a 51 y.o. G1P1 female who presents for f/u of vasomotor symptoms.  Was initiated on Paxil last visit. Has gone from 15-20 hot flashes per day to none.  Denies any undesirable side effects.   Of note, patient's last pap smear was 2 years ago. Scheduled for mammogram in August.   The following portions of the patient's history were reviewed and updated as appropriate: allergies, current medications, past family history, past medical history, past social history, past surgical history and problem list.  Review of Systems Pertinent items noted in HPI and remainder of comprehensive ROS otherwise negative.   Objective:   Blood pressure 111/66, pulse (!) 102, height 5\' 10"  (1.778 m), weight 230 lb (104.3 kg), last menstrual period 01/02/2016. General appearance: alert and no distress Remainder of exam deferred.    Assessment:   Menopausal vasomotor symptoms  Plan:   Patient doing well with Paxil.  Can continue treatment as prescribed.  To follow up in 1 year, or as needed.  Health maintenance screening up to date.  Seen at [facility] for annual female exams.    Rubie Maid, MD [facility]

## 2017-06-07 DIAGNOSIS — M8580 Other specified disorders of bone density and structure, unspecified site: Secondary | ICD-10-CM | POA: Diagnosis not present

## 2017-06-07 DIAGNOSIS — T380X5A Adverse effect of glucocorticoids and synthetic analogues, initial encounter: Secondary | ICD-10-CM | POA: Diagnosis not present

## 2017-06-11 ENCOUNTER — Ambulatory Visit
Admission: RE | Admit: 2017-06-11 | Discharge: 2017-06-11 | Disposition: A | Discharge status: Home / Self Care | Payer: Medicare Other | Source: Ambulatory Visit | Attending: Obstetrics and Gynecology | Admitting: Obstetrics and Gynecology

## 2017-06-11 DIAGNOSIS — Z1231 Encounter for screening mammogram for malignant neoplasm of breast: Secondary | ICD-10-CM | POA: Diagnosis not present

## 2017-06-15 DIAGNOSIS — F3181 Bipolar II disorder: Secondary | ICD-10-CM | POA: Diagnosis not present

## 2017-06-21 ENCOUNTER — Other Ambulatory Visit: Payer: Self-pay | Admitting: Internal Medicine

## 2017-06-21 DIAGNOSIS — Z5181 Encounter for therapeutic drug level monitoring: Secondary | ICD-10-CM

## 2017-06-21 DIAGNOSIS — T380X5A Adverse effect of glucocorticoids and synthetic analogues, initial encounter: Principal | ICD-10-CM

## 2017-06-21 DIAGNOSIS — E063 Autoimmune thyroiditis: Secondary | ICD-10-CM

## 2017-06-21 DIAGNOSIS — M359 Systemic involvement of connective tissue, unspecified: Secondary | ICD-10-CM

## 2017-06-21 DIAGNOSIS — E559 Vitamin D deficiency, unspecified: Secondary | ICD-10-CM

## 2017-06-21 DIAGNOSIS — E038 Other specified hypothyroidism: Secondary | ICD-10-CM

## 2017-06-21 DIAGNOSIS — M818 Other osteoporosis without current pathological fracture: Secondary | ICD-10-CM

## 2017-06-21 DIAGNOSIS — M8580 Other specified disorders of bone density and structure, unspecified site: Secondary | ICD-10-CM

## 2017-06-21 DIAGNOSIS — M80051G Age-related osteoporosis with current pathological fracture, right femur, subsequent encounter for fracture with delayed healing: Secondary | ICD-10-CM

## 2017-06-21 DIAGNOSIS — Z79899 Other long term (current) drug therapy: Secondary | ICD-10-CM

## 2017-06-29 ENCOUNTER — Ambulatory Visit
Admission: RE | Admit: 2017-06-29 | Discharge: 2017-06-29 | Disposition: A | Discharge status: Home / Self Care | Payer: Medicare Other | Source: Ambulatory Visit | Attending: Internal Medicine | Admitting: Internal Medicine

## 2017-06-29 DIAGNOSIS — T380X5A Adverse effect of glucocorticoids and synthetic analogues, initial encounter: Secondary | ICD-10-CM | POA: Diagnosis not present

## 2017-06-29 DIAGNOSIS — E063 Autoimmune thyroiditis: Secondary | ICD-10-CM

## 2017-06-29 DIAGNOSIS — E559 Vitamin D deficiency, unspecified: Secondary | ICD-10-CM | POA: Diagnosis not present

## 2017-06-29 DIAGNOSIS — Z79899 Other long term (current) drug therapy: Secondary | ICD-10-CM | POA: Diagnosis not present

## 2017-06-29 DIAGNOSIS — M8589 Other specified disorders of bone density and structure, multiple sites: Secondary | ICD-10-CM | POA: Diagnosis not present

## 2017-06-29 DIAGNOSIS — M85852 Other specified disorders of bone density and structure, left thigh: Secondary | ICD-10-CM | POA: Diagnosis not present

## 2017-06-29 DIAGNOSIS — M818 Other osteoporosis without current pathological fracture: Secondary | ICD-10-CM | POA: Diagnosis not present

## 2017-06-29 DIAGNOSIS — J45909 Unspecified asthma, uncomplicated: Secondary | ICD-10-CM | POA: Diagnosis not present

## 2017-06-29 DIAGNOSIS — E038 Other specified hypothyroidism: Secondary | ICD-10-CM | POA: Diagnosis not present

## 2017-06-29 DIAGNOSIS — M359 Systemic involvement of connective tissue, unspecified: Secondary | ICD-10-CM

## 2017-06-29 DIAGNOSIS — M8580 Other specified disorders of bone density and structure, unspecified site: Secondary | ICD-10-CM | POA: Diagnosis not present

## 2017-06-29 DIAGNOSIS — M329 Systemic lupus erythematosus, unspecified: Secondary | ICD-10-CM | POA: Insufficient documentation

## 2017-06-29 DIAGNOSIS — Z780 Asymptomatic menopausal state: Secondary | ICD-10-CM | POA: Diagnosis not present

## 2017-06-29 DIAGNOSIS — M80051G Age-related osteoporosis with current pathological fracture, right femur, subsequent encounter for fracture with delayed healing: Secondary | ICD-10-CM

## 2017-06-29 DIAGNOSIS — Z5181 Encounter for therapeutic drug level monitoring: Secondary | ICD-10-CM

## 2017-06-29 DIAGNOSIS — M8588 Other specified disorders of bone density and structure, other site: Secondary | ICD-10-CM | POA: Insufficient documentation

## 2017-07-12 DIAGNOSIS — G479 Sleep disorder, unspecified: Secondary | ICD-10-CM | POA: Diagnosis not present

## 2017-07-12 DIAGNOSIS — F3181 Bipolar II disorder: Secondary | ICD-10-CM | POA: Diagnosis not present

## 2017-07-20 DIAGNOSIS — H04123 Dry eye syndrome of bilateral lacrimal glands: Secondary | ICD-10-CM | POA: Diagnosis not present

## 2017-07-20 DIAGNOSIS — Z79899 Other long term (current) drug therapy: Secondary | ICD-10-CM | POA: Diagnosis not present

## 2017-07-26 ENCOUNTER — Emergency Department
Admission: EM | Admit: 2017-07-26 | Discharge: 2017-07-27 | Disposition: A | Discharge status: Home / Self Care | Payer: Medicare Other | Attending: Emergency Medicine | Admitting: Emergency Medicine

## 2017-07-26 ENCOUNTER — Emergency Department: Payer: Medicare Other

## 2017-07-26 DIAGNOSIS — E039 Hypothyroidism, unspecified: Secondary | ICD-10-CM | POA: Diagnosis not present

## 2017-07-26 DIAGNOSIS — Z96641 Presence of right artificial hip joint: Secondary | ICD-10-CM | POA: Diagnosis not present

## 2017-07-26 DIAGNOSIS — R079 Chest pain, unspecified: Secondary | ICD-10-CM | POA: Diagnosis not present

## 2017-07-26 DIAGNOSIS — R251 Tremor, unspecified: Secondary | ICD-10-CM | POA: Diagnosis not present

## 2017-07-26 DIAGNOSIS — Z7982 Long term (current) use of aspirin: Secondary | ICD-10-CM | POA: Diagnosis not present

## 2017-07-26 DIAGNOSIS — Z79899 Other long term (current) drug therapy: Secondary | ICD-10-CM | POA: Diagnosis not present

## 2017-07-26 DIAGNOSIS — E119 Type 2 diabetes mellitus without complications: Secondary | ICD-10-CM | POA: Diagnosis not present

## 2017-07-26 DIAGNOSIS — J45909 Unspecified asthma, uncomplicated: Secondary | ICD-10-CM | POA: Insufficient documentation

## 2017-07-26 DIAGNOSIS — R071 Chest pain on breathing: Secondary | ICD-10-CM | POA: Diagnosis not present

## 2017-07-26 DIAGNOSIS — Z91040 Latex allergy status: Secondary | ICD-10-CM | POA: Insufficient documentation

## 2017-07-26 LAB — CBC WITH DIFFERENTIAL/PLATELET
Basophils Absolute: 0 10*3/uL (ref 0–0.1)
Basophils Relative: 1 %
EOS ABS: 0.1 10*3/uL (ref 0–0.7)
Eosinophils Relative: 2 %
HEMATOCRIT: 35.6 % (ref 35.0–47.0)
HEMOGLOBIN: 11.2 g/dL — AB (ref 12.0–16.0)
LYMPHS PCT: 34 %
Lymphs Abs: 2 10*3/uL (ref 1.0–3.6)
MCH: 25.1 pg — AB (ref 26.0–34.0)
MCHC: 31.3 g/dL — AB (ref 32.0–36.0)
MCV: 80.2 fL (ref 80.0–100.0)
Monocytes Absolute: 0.6 10*3/uL (ref 0.2–0.9)
Monocytes Relative: 10 %
Neutro Abs: 3.3 10*3/uL (ref 1.4–6.5)
Neutrophils Relative %: 53 %
Platelets: 169 10*3/uL (ref 150–440)
RBC: 4.45 MIL/uL (ref 3.80–5.20)
RDW: 22.1 % — ABNORMAL HIGH (ref 11.5–14.5)
WBC: 6.1 10*3/uL (ref 3.6–11.0)

## 2017-07-26 MED ORDER — ONDANSETRON HCL 4 MG/2ML IJ SOLN
4.0000 mg | Freq: Once | INTRAMUSCULAR | Status: AC
  Administered 2017-07-26: 4 mg via INTRAVENOUS

## 2017-07-26 MED ORDER — MORPHINE SULFATE (PF) 4 MG/ML IV SOLN
4.0000 mg | Freq: Once | INTRAVENOUS | Status: AC
  Administered 2017-07-26: 4 mg via INTRAVENOUS

## 2017-07-26 MED ORDER — ONDANSETRON HCL 4 MG/2ML IJ SOLN
INTRAMUSCULAR | Status: AC
  Administered 2017-07-26: 4 mg via INTRAVENOUS
  Filled 2017-07-26: qty 2

## 2017-07-26 MED ORDER — MORPHINE SULFATE (PF) 4 MG/ML IV SOLN
INTRAVENOUS | Status: AC
  Administered 2017-07-26: 4 mg via INTRAVENOUS
  Filled 2017-07-26: qty 1

## 2017-07-26 NOTE — ED Provider Notes (Signed)
[facility] Emergency Department Provider Note   ____________________________________________   First MD Initiated Contact with Patient 07/26/17 2329     (approximate)  I have reviewed the triage vital signs and the nursing notes.   HISTORY  Chief Complaint Chest Pain    HPI [name] [name] is a 51 y.o. female Who reports she developed chest pain about an hour ago. She took her usual 325 aspirin this morning and after she got chest pain she took another 325 of aspirin. The pain is in the middle of her chest that feels like someone sitting on her radiates to her neck. She is a little short of breath with it little nauseated with it and is worse if she takes a deep breath. It feels a little bit like her lupus pain but not exactly she's had pleurisy before but this is a little bit different.pain is moderate in nature   Past Medical History:  Diagnosis Date  . ANXIETY 06/06/2007  . Asthma   . BACK PAIN 02/22/2009  . Cervical cancer ([place])   . DEPRESSION 06/06/2007  . Diabetes mellitus without complication ([place])    patient denies  . Essential tremor   . Fibromyalgia   . FREQUENCY, URINARY 07/07/2007  . Gastritis   . GERD 06/06/2007  . Headache   . History of hiatal hernia   . History of kidney stones   . HYPOTHYROIDISM 06/06/2007  . INTERSTITIAL CYSTITIS 09/11/2010  . Lupus 2006  . Lupus   . NEPHROLITHIASIS, HX OF 11/05/2010  . Neuropathy   . OSTEOPENIA 10/14/2009  . OVERACTIVE BLADDER 10/14/2009  . PAIN, CHRONIC NEC 06/06/2007  . POLYARTHRITIS 08/19/2007  . 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 WEIGHT GAIN 02/22/2009    Patient Active Problem List   Diagnosis Date Noted  . Status post hardware removal 02/16/2017  . Sicca syndrome ([place]) 06/04/2014  . Abdominal pain, epigastric 09/08/2013  . Type II or unspecified type diabetes mellitus without mention of complication, uncontrolled 03/15/2013  . UTI (urinary tract infection) 02/15/2013  .  Chronic pain 03/31/2011  . NEPHROLITHIASIS, HX OF 11/05/2010  . INTERSTITIAL CYSTITIS 09/11/2010  . OVERACTIVE BLADDER 10/14/2009  . OSTEOPENIA 10/14/2009  . BACK PAIN 02/22/2009  . WEIGHT GAIN 02/22/2009  . 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 POLYARTHRITIS 08/19/2007  . FREQUENCY, URINARY 07/07/2007  . HYPOTHYROIDISM 06/06/2007  . ANXIETY 06/06/2007  . DEPRESSION 06/06/2007  . PAIN, CHRONIC NEC 06/06/2007  . GERD 06/06/2007    Past Surgical History:  Procedure Laterality Date  . APPENDECTOMY    . CERVICAL BIOPSY  W/ LOOP ELECTRODE EXCISION  2005  . HARDWARE REMOVAL Right 02/16/2017   Procedure: HARDWARE REMOVAL FROM HIP;  Surgeon: Hessie Knows, MD;  Location: [facility] ORS;  Service: Orthopedics;  Laterality: Right;  . JOINT REPLACEMENT    . [place] ARTHROPLASTY  06-2009  . TONSILLECTOMY    . TONSILLECTOMY    . WRIST RECONSTRUCTION      Prior to Admission medications   Medication Sig Start Date End Date Taking? Authorizing Provider  albuterol (PROAIR HFA) 108 (90 Base) MCG/ACT inhaler ProAir HFA 90 mcg/actuation aerosol inhaler    [provider]  albuterol (VENTOLIN HFA) 108 (90 Base) MCG/ACT inhaler Ventolin HFA 90 mcg/actuation aerosol inhaler  INHALE 2 PUFFS (180 MCG) BY INHALATION ROUTE EVERY 4 HOURS    [provider]  ARIPiprazole (ABILIFY) 20 MG tablet Take 20 mg by mouth daily.    [provider]  Ascorbic Acid (  VITAMIN C) 500 MG CHEW Chew 2,500 mg by mouth daily.    [provider]  aspirin 325 MG tablet Take 1 tablet (325 mg total) by mouth daily. 02/18/17   Duanne Guess, PA-C  Biotin 10 MG CAPS Take by mouth.    [provider]  Biotin 5000 MCG SUBL Place 10,000 mcg under the tongue daily.    [provider]  budesonide-formoterol (SYMBICORT) 160-4.5 MCG/ACT inhaler Symbicort 160 mcg-4.5 mcg/actuation HFA aerosol inhaler    [provider]  calcipotriene (DOVONOX) 0.005 % ointment  calcipotriene 0.005 % topical ointment  APPLY A THIN LAYER TO THE AFFECTED AREA(S) BY TOPICAL ROUTE ONCE DAILY ; RUB IN GENTLY AND COMPLETELY    [provider]  Cholecalciferol (VITAMIN D3) 2000 units capsule Vitamin D3 2,000 unit capsule    [provider]  dapsone 25 MG tablet Take by mouth.    [provider]  dexmethylphenidate (FOCALIN XR) 20 MG 24 hr capsule dexmethylphenidate ER 20 mg capsule,extended release biphasic50-50    [provider]  diclofenac sodium (VOLTAREN) 1 % GEL Apply 2 g topically 4 (four) times daily as needed (for knee & finger pain.).     [provider]  dicyclomine (BENTYL) 10 MG capsule dicyclomine 10 mg capsule  every 8hours as needed    [provider]  DULoxetine (CYMBALTA) 60 MG capsule duloxetine 60 mg capsule,delayed release  1 twice daily    [provider]  EPINEPHrine (EPIPEN 2-PAK) 0.3 mg/0.3 mL IJ SOAJ injection EpiPen 2-Pak 0.3 mg/0.3 mL injection, auto-injector    [provider]  etodolac (LODINE) 500 MG tablet etodolac 500 mg tablet    [provider]  etodolac (LODINE) 500 MG tablet Take by mouth. 05/25/17 11/24/17  [provider]  fentaNYL (DURAGESIC - DOSED MCG/HR) 75 MCG/HR Place 75 mcg onto the skin every 3 (three) days.    [provider]  fluocinonide ointment (LIDEX) 0.05 % fluocinonide 0.05 % topical ointment  APPLY TO THE AFFECTED AREA(S) BY TOPICAL ROUTE 2 TIMES PER DAY    [provider]  folic acid (FOLVITE) 1 MG tablet Take 1 mg by mouth daily.    [provider]  hydrocortisone (PROCTOZONE-HC) 2.5 % rectal cream Proctozone-HC 2.5 % topical cream perineal applicator    [provider]  hydroxychloroquine (PLAQUENIL) 200 MG tablet Plaquenil 200 mg tablet  Take 1 tablet twice a day by oral route after meals for 90 days.    [provider]  imiquimod (ALDARA) 5 % cream imiquimod 5 % topical cream packet   APPLY TO THE AFFECTED AREA(S) BY TOPICAL ROUTE 3 TIMES PER WEEK at bedtime    [provider]  levothyroxine (SYNTHROID, LEVOTHROID) 25 MCG tablet Take 1 tablet (25 mcg total) by mouth daily before breakfast. 03/15/14   Marletta Lor, MD  Lifitegrast 5 % SOLN Apply to eye.    [provider]  LORazepam (ATIVAN) 2 MG tablet Take 1-2 mg by mouth See admin instructions. 1 MG DAILY AS NEEDED FOR ANXIETY & SCHEDULED AT BEDTIME EACH NIGHT    [provider]  montelukast (SINGULAIR) 10 MG tablet montelukast 10 mg tablet  TAKE 1 TABLET BY MOUTH DAILY    [provider]  Multiple Vitamins-Minerals (MULTI-VITAMIN GUMMIES PO) Multi Vitamin  GUMMIES    [provider]  nystatin (NYAMYC) powder Nyamyc 100,000 unit/gram topical powder    [provider]  ondansetron (ZOFRAN) 8 MG tablet Zofran 8 mg tablet  Take 1 tablet every 8 hours by oral route as needed.    [provider]  oxyCODONE-acetaminophen (PERCOCET/ROXICET) 5-325 MG tablet Take 1-2 tablets by mouth every 4 (four) hours as needed for moderate pain. 02/17/17   Duanne Guess, PA-C  pantoprazole (PROTONIX) 40 MG tablet Take one tablet by mouth twice daily Patient taking differently: TAKE 1 TABLET (40 MG) BY MOUTH ONCE DAILY IN THE MORNING. 12/18/13   Marletta Lor, MD  PARoxetine (PAXIL) 10 MG tablet Take 1 tablet (10 mg total) by mouth at bedtime. 05/26/17   Rubie Maid, MD  Polyvinyl Alcohol-Povidone (REFRESH OP) Place 1 drop into both eyes 2 (two) times daily.    [provider]  Polyvinyl Alcohol-Povidone PF 1.4-0.6 % SOLN Apply to eye.    [provider]  predniSONE (DELTASONE) 10 MG tablet prednisone 10 mg tablet  1 daily    [provider]  Probiotic Product (PROBIOTIC PO) Probiotic 10 billion cell capsule  daily    [provider]  silver sulfADIAZINE (SILVADENE) 1 % cream silver sulfadiazine 1 % topical cream    [provider]  Sodium Chloride 3 % AERS Saline Nasal Mist 3 %  spray nose deeply every hour or two and blow nose    [provider]  sucralfate (CARAFATE) 1 g tablet sucralfate 1 gram tablet    [provider]  topiramate (TOPAMAX) 100 MG tablet topiramate 100 mg tablet  TAKE 1 TABLET BY MOUTH 2 TIMES DAILY.    [provider]  triamcinolone cream (KENALOG) 0.1 % triamcinolone acetonide 0.1 % topical cream    [provider]  valACYclovir (VALTREX) 500 MG tablet valacyclovir 500 mg tablet  1 daily    [provider]    Allergies Latex; Prasterone; Shellfish-derived products; Bee venom; Compazine [prochlorperazine edisylate]; Dhea [nutritional supplements]; Other; Prochlorperazine; Promethazine; Promethazine hcl; Clarithromycin; Fish allergy; Hydromorphone hcl; and Penicillins  Family History  Problem Relation Age of Onset  . Hypertension Mother   . Arthritis Mother   . Asthma Sister   . Asthma Daughter   . Pancreatic cancer Father   . Diabetes Maternal Grandmother   . Heart disease Maternal Grandmother   . Colon cancer Maternal Uncle 4  . Crohn's disease Maternal Aunt   . Diabetes Maternal Aunt   . Breast cancer Cousin 39       maternal    Social History Social History  Substance Use Topics  . Smoking status: Never Smoker  . Smokeless tobacco: Never Used  . Alcohol use No    Review of Systems  Constitutional: No fever/chills Eyes: No visual changes. ENT: No sore throat. Cardiovascular: see history of present illness Respiratory:see history of present illness Gastrointestinal: No abdominal pain.  No nausea, no vomiting.  No diarrhea.  No constipation. Genitourinary: Negative for dysuria. Musculoskeletal: Negative for back painat present. Skin: Negative for rash. Neurological: Negative for headaches, focal weakness  ____________________________________________   PHYSICAL EXAM:  VITAL SIGNS: ED Triage Vitals  Enc  Vitals Group     BP 07/26/17 2333 (!) 159/112     Pulse Rate 07/26/17 2333 84     Resp 07/26/17 2333 20     Temp 07/26/17 2333 98.7 F (37.1 C)     Temp Source 07/26/17 2333 Oral     SpO2 07/26/17 2333 97 %     Weight 07/26/17 2332 224 lb (101.6 kg)     Height 07/26/17 2332 5\' 10"  (1.778 m)  Head Circumference --      Peak Flow --      Pain Score 07/26/17 2331 8     Pain Loc --      Pain Edu? --      Excl. in [place]? --    Constitutional: Alert and oriented. Well appearing and in no acute distress. Eyes: Conjunctivae are normal. Head: Atraumatic. Nose: No congestion/rhinnorhea. Mouth/Throat: Mucous membranes are moist.  Oropharynx non-erythematous. Neck: No stridor.  Cardiovascular: Normal rate, regular rhythm. Grossly normal heart sounds.  Good peripheral circulation. Respiratory: Normal respiratory effort.  No retractions. Lungs CTAB. Gastrointestinal: Soft and nontender. No distention. No abdominal bruits. No CVA tenderness. Musculoskeletal: No lower extremity tenderness nor edema.  No joint effusions. Neurologic:  Normal speech and language. No gross focal neurologic deficits are appreciated.  Skin:  Skin is warm, dry and intact. No rash noted. Psychiatric: patient looks somewhat anxious  ____________________________________________   LABS (all labs ordered are listed, but only abnormal results are displayed)  Labs Reviewed  COMPREHENSIVE METABOLIC PANEL - Abnormal; Notable for the following:       Result Value   Chloride 112 (*)    Glucose, Bld 106 (*)    Total Protein 6.3 (*)    All other components within normal limits  CBC WITH DIFFERENTIAL/PLATELET - Abnormal; Notable for the following:    Hemoglobin 11.2 (*)    MCH 25.1 (*)    MCHC 31.3 (*)    RDW 22.1 (*)    All other components within normal limits  TROPONIN I  FIBRIN DERIVATIVES D-DIMER ([facility] ONLY)  TROPONIN I   ____________________________________________  EKG EKG read and interpreted by me shows  normal sinus rhythm rate of 80normal axis essentially normal EKG ____________________________________________  RADIOLOGY  IMPRESSION: No active disease.   Electronically Signed   By: Andreas Newport M.D.   On: 07/27/2017 00:46  ____________________________________________   PROCEDURES  Procedure(s) performed:  Procedures  Critical Care performed:   ____________________________________________   INITIAL IMPRESSION / ASSESSMENT AND PLAN / ED COURSE  Pertinent labs & imaging results that were available during my care of the patient were reviewed by me and considered in my medical decision making (see chart for details).        ____________________________________________   FINAL CLINICAL IMPRESSION(S) / ED DIAGNOSES  Final diagnoses:  Chest pain on breathing      NEW MEDICATIONS STARTED DURING THIS VISIT:  Discharge Medication List as of 07/27/2017  3:34 AM       Note:  This document was prepared using Dragon voice recognition software and may include unintentional dictation errors.    Nena Polio, MD 07/27/17 [phone]

## 2017-07-26 NOTE — ED Triage Notes (Addendum)
Pt comes via [facility] from home with c/o of chest pain that radiates to her neck. Pt states shortness of breathe and nausea. Pt states she took 2-(324) aspirin with no relief. Per EMS VS stable. Pt is A&OX4, respirations even and unlabored at this time. Pt also states that she took 1 perocet to help with her Lupus, but still no relief.

## 2017-07-27 DIAGNOSIS — R079 Chest pain, unspecified: Secondary | ICD-10-CM | POA: Diagnosis not present

## 2017-07-27 LAB — COMPREHENSIVE METABOLIC PANEL
ALBUMIN: 3.9 g/dL (ref 3.5–5.0)
ALK PHOS: 71 U/L (ref 38–126)
ALT: 22 U/L (ref 14–54)
ANION GAP: 6 (ref 5–15)
AST: 24 U/L (ref 15–41)
BILIRUBIN TOTAL: 0.6 mg/dL (ref 0.3–1.2)
BUN: 19 mg/dL (ref 6–20)
CALCIUM: 9 mg/dL (ref 8.9–10.3)
CO2: 27 mmol/L (ref 22–32)
CREATININE: 0.87 mg/dL (ref 0.44–1.00)
Chloride: 112 mmol/L — ABNORMAL HIGH (ref 101–111)
GFR calc Af Amer: >60 mL/min (ref >60)
GFR calc non Af Amer: >60 mL/min (ref >60)
GLUCOSE: 106 mg/dL — AB (ref 65–99)
Potassium: 4.3 mmol/L (ref 3.5–5.1)
SODIUM: 145 mmol/L (ref 135–145)
TOTAL PROTEIN: 6.3 g/dL — AB (ref 6.5–8.1)

## 2017-07-27 LAB — TROPONIN I: Troponin I: <0.03 ng/mL (ref <0.03)

## 2017-07-27 LAB — FIBRIN DERIVATIVES D-DIMER (ARMC ONLY): Fibrin derivatives D-dimer (ARMC): 251.51 (ref 0.00–499.00)

## 2017-07-27 MED ORDER — ONDANSETRON HCL 4 MG/2ML IJ SOLN
INTRAMUSCULAR | Status: AC
  Administered 2017-07-27: 4 mg via INTRAVENOUS
  Filled 2017-07-27: qty 2

## 2017-07-27 MED ORDER — ONDANSETRON HCL 4 MG/2ML IJ SOLN
4.0000 mg | Freq: Once | INTRAMUSCULAR | Status: AC
  Administered 2017-07-27: 4 mg via INTRAVENOUS

## 2017-07-27 NOTE — Discharge Instructions (Signed)
The tests we've done tonight look normal. This could possibly be your lupus. Just in case I want you to follow-up with the cardiologist . [facility] Parents call him in the morning and let the office know you were in the emergency room with chest pain and pressure should be able to see you in the morning or tomorrow. Please return if you're worse at all. Especially return for shortness of breath worse pain or fever.

## 2017-07-27 NOTE — ED Notes (Signed)
Esign not working. Pt verbalized understanding discharge instructions and has no questions at this time.

## 2017-07-30 DIAGNOSIS — R0602 Shortness of breath: Secondary | ICD-10-CM | POA: Diagnosis not present

## 2017-07-30 DIAGNOSIS — M329 Systemic lupus erythematosus, unspecified: Secondary | ICD-10-CM | POA: Diagnosis not present

## 2017-07-30 DIAGNOSIS — R079 Chest pain, unspecified: Secondary | ICD-10-CM | POA: Diagnosis not present

## 2017-08-04 ENCOUNTER — Emergency Department
Admission: EM | Admit: 2017-08-04 | Discharge: 2017-08-04 | Disposition: A | Discharge status: Home / Self Care | Payer: Medicare Other | Attending: Emergency Medicine | Admitting: Emergency Medicine

## 2017-08-04 ENCOUNTER — Encounter: Payer: Self-pay | Admitting: *Deleted

## 2017-08-04 ENCOUNTER — Emergency Department: Payer: Medicare Other

## 2017-08-04 DIAGNOSIS — M25551 Pain in right hip: Secondary | ICD-10-CM | POA: Diagnosis not present

## 2017-08-04 DIAGNOSIS — J45909 Unspecified asthma, uncomplicated: Secondary | ICD-10-CM | POA: Insufficient documentation

## 2017-08-04 DIAGNOSIS — E119 Type 2 diabetes mellitus without complications: Secondary | ICD-10-CM | POA: Insufficient documentation

## 2017-08-04 DIAGNOSIS — E039 Hypothyroidism, unspecified: Secondary | ICD-10-CM | POA: Diagnosis not present

## 2017-08-04 DIAGNOSIS — Z91040 Latex allergy status: Secondary | ICD-10-CM | POA: Diagnosis not present

## 2017-08-04 DIAGNOSIS — Z96649 Presence of unspecified artificial hip joint: Secondary | ICD-10-CM | POA: Insufficient documentation

## 2017-08-04 DIAGNOSIS — Z79899 Other long term (current) drug therapy: Secondary | ICD-10-CM | POA: Diagnosis not present

## 2017-08-04 MED ORDER — LIDOCAINE 5 % EX PTCH: 1.0000 | MEDICATED_PATCH | Freq: Two times a day (BID) | CUTANEOUS | 0 refills | Status: AC

## 2017-08-04 MED ORDER — LIDOCAINE 5 % EX PTCH
1.0000 | MEDICATED_PATCH | CUTANEOUS | Status: DC
  Administered 2017-08-04: 1 via TRANSDERMAL
  Filled 2017-08-04: qty 1

## 2017-08-04 MED ORDER — IBUPROFEN 800 MG PO TABS: 800.0000 mg | ORAL_TABLET | Freq: Three times a day (TID) | ORAL | 0 refills | Status: DC | PRN

## 2017-08-04 MED ORDER — KETOROLAC TROMETHAMINE 60 MG/2ML IM SOLN
30.0000 mg | Freq: Once | INTRAMUSCULAR | Status: AC
  Administered 2017-08-04: 30 mg via INTRAMUSCULAR
  Filled 2017-08-04: qty 2

## 2017-08-04 NOTE — ED Notes (Signed)
Patient transported to X-ray 

## 2017-08-04 NOTE — ED Provider Notes (Signed)
[facility] Emergency Department Provider Note  ____________________________________________  Time seen: Approximately 10:34 AM  I have reviewed the triage vital signs and the nursing notes.   HISTORY  Chief Complaint Hip Pain    HPI [name] [name] is a 51 y.o. female that presents to the emergency department with right hip pain for one day. Pain is worse on the side and on the front of her hip. No recent injury. Pain is worse with moving her leg out and with walking up stairs. Patient states that she had a previous fracture and had plates and screws put in right hip, which were removed in March. Her orthopedic doctor, Dr. Rudene Christians, was not in the office today.No bowel or bladder dysfunction or saddle paresthesias. No shortness of breath, chest pain, nausea, vomiting, abdominal pain, back pain, numbness, tingling.   Past Medical History:  Diagnosis Date  . ANXIETY 06/06/2007  . Asthma   . BACK PAIN 02/22/2009  . Cervical cancer ([place])   . DEPRESSION 06/06/2007  . Diabetes mellitus without complication ([place])    patient denies  . Essential tremor   . Fibromyalgia   . FREQUENCY, URINARY 07/07/2007  . Gastritis   . GERD 06/06/2007  . Headache   . History of hiatal hernia   . History of kidney stones   . HYPOTHYROIDISM 06/06/2007  . INTERSTITIAL CYSTITIS 09/11/2010  . Lupus 2006  . Lupus   . NEPHROLITHIASIS, HX OF 11/05/2010  . Neuropathy   . OSTEOPENIA 10/14/2009  . OVERACTIVE BLADDER 10/14/2009  . PAIN, CHRONIC NEC 06/06/2007  . POLYARTHRITIS 08/19/2007  . 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 WEIGHT GAIN 02/22/2009    Patient Active Problem List   Diagnosis Date Noted  . Status post hardware removal 02/16/2017  . Sicca syndrome ([place]) 06/04/2014  . Abdominal pain, epigastric 09/08/2013  . Type II or unspecified type diabetes mellitus without mention of complication, uncontrolled 03/15/2013  . UTI (urinary tract infection) 02/15/2013  . Chronic pain  03/31/2011  . NEPHROLITHIASIS, HX OF 11/05/2010  . INTERSTITIAL CYSTITIS 09/11/2010  . OVERACTIVE BLADDER 10/14/2009  . OSTEOPENIA 10/14/2009  . BACK PAIN 02/22/2009  . WEIGHT GAIN 02/22/2009  . 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 POLYARTHRITIS 08/19/2007  . FREQUENCY, URINARY 07/07/2007  . HYPOTHYROIDISM 06/06/2007  . ANXIETY 06/06/2007  . DEPRESSION 06/06/2007  . PAIN, CHRONIC NEC 06/06/2007  . GERD 06/06/2007    Past Surgical History:  Procedure Laterality Date  . APPENDECTOMY    . CERVICAL BIOPSY  W/ LOOP ELECTRODE EXCISION  2005  . HARDWARE REMOVAL Right 02/16/2017   Procedure: HARDWARE REMOVAL FROM HIP;  Surgeon: Hessie Knows, MD;  Location: [facility] ORS;  Service: Orthopedics;  Laterality: Right;  . JOINT REPLACEMENT    . [place] ARTHROPLASTY  06-2009  . TONSILLECTOMY    . TONSILLECTOMY    . WRIST RECONSTRUCTION      Prior to Admission medications   Medication Sig Start Date End Date Taking? Authorizing Provider  albuterol (PROAIR HFA) 108 (90 Base) MCG/ACT inhaler ProAir HFA 90 mcg/actuation aerosol inhaler    [provider]  albuterol (VENTOLIN HFA) 108 (90 Base) MCG/ACT inhaler Ventolin HFA 90 mcg/actuation aerosol inhaler  INHALE 2 PUFFS (180 MCG) BY INHALATION ROUTE EVERY 4 HOURS    [provider]  ARIPiprazole (ABILIFY) 20 MG tablet Take 20 mg by mouth daily.    [provider]  Ascorbic Acid (VITAMIN C) 500 MG CHEW Chew 2,500 mg by mouth daily.  [provider]  aspirin 325 MG tablet Take 1 tablet (325 mg total) by mouth daily. 02/18/17   Duanne Guess, PA-C  Biotin 10 MG CAPS Take by mouth.    [provider]  Biotin 5000 MCG SUBL Place 10,000 mcg under the tongue daily.    [provider]  budesonide-formoterol (SYMBICORT) 160-4.5 MCG/ACT inhaler Symbicort 160 mcg-4.5 mcg/actuation HFA aerosol inhaler    [provider]  calcipotriene (DOVONOX) 0.005 % ointment calcipotriene 0.005 %  topical ointment  APPLY A THIN LAYER TO THE AFFECTED AREA(S) BY TOPICAL ROUTE ONCE DAILY ; RUB IN GENTLY AND COMPLETELY    [provider]  Cholecalciferol (VITAMIN D3) 2000 units capsule Vitamin D3 2,000 unit capsule    [provider]  dapsone 25 MG tablet Take by mouth.    [provider]  dexmethylphenidate (FOCALIN XR) 20 MG 24 hr capsule dexmethylphenidate ER 20 mg capsule,extended release biphasic50-50    [provider]  diclofenac sodium (VOLTAREN) 1 % GEL Apply 2 g topically 4 (four) times daily as needed (for knee & finger pain.).     [provider]  dicyclomine (BENTYL) 10 MG capsule dicyclomine 10 mg capsule  every 8hours as needed    [provider]  DULoxetine (CYMBALTA) 60 MG capsule duloxetine 60 mg capsule,delayed release  1 twice daily    [provider]  EPINEPHrine (EPIPEN 2-PAK) 0.3 mg/0.3 mL IJ SOAJ injection EpiPen 2-Pak 0.3 mg/0.3 mL injection, auto-injector    [provider]  etodolac (LODINE) 500 MG tablet etodolac 500 mg tablet    [provider]  etodolac (LODINE) 500 MG tablet Take by mouth. 05/25/17 11/24/17  [provider]  fentaNYL (DURAGESIC - DOSED MCG/HR) 75 MCG/HR Place 75 mcg onto the skin every 3 (three) days.    [provider]  fluocinonide ointment (LIDEX) 0.05 % fluocinonide 0.05 % topical ointment  APPLY TO THE AFFECTED AREA(S) BY TOPICAL ROUTE 2 TIMES PER DAY    [provider]  folic acid (FOLVITE) 1 MG tablet Take 1 mg by mouth daily.    [provider]  hydrocortisone (PROCTOZONE-HC) 2.5 % rectal cream Proctozone-HC 2.5 % topical cream perineal applicator    [provider]  hydroxychloroquine (PLAQUENIL) 200 MG tablet Plaquenil 200 mg tablet  Take 1 tablet twice a day by oral route after meals for 90 days.    [provider]  ibuprofen (ADVIL,MOTRIN) 800 MG tablet Take 1 tablet (800 mg total) by mouth every 8  (eight) hours as needed. 08/04/17   Laban Emperor, PA-C  imiquimod (ALDARA) 5 % cream imiquimod 5 % topical cream packet  APPLY TO THE AFFECTED AREA(S) BY TOPICAL ROUTE 3 TIMES PER WEEK at bedtime    [provider]  levothyroxine (SYNTHROID, LEVOTHROID) 25 MCG tablet Take 1 tablet (25 mcg total) by mouth daily before breakfast. 03/15/14   Marletta Lor, MD  lidocaine (LIDODERM) 5 % Place 1 patch onto the skin every 12 (twelve) hours. Remove & Discard patch within 12 hours or as directed by MD 08/04/17 08/04/18  Laban Emperor, PA-C  Lifitegrast 5 % SOLN Apply to eye.    [provider]  LORazepam (ATIVAN) 2 MG tablet Take 1-2 mg by mouth See admin instructions. 1 MG DAILY AS NEEDED FOR ANXIETY & SCHEDULED AT BEDTIME EACH NIGHT    [provider]  montelukast (SINGULAIR) 10 MG tablet montelukast 10 mg tablet  TAKE 1 TABLET BY MOUTH DAILY  [provider]  Multiple Vitamins-Minerals (MULTI-VITAMIN GUMMIES PO) Multi Vitamin  GUMMIES    [provider]  nystatin (NYAMYC) powder Nyamyc 100,000 unit/gram topical powder    [provider]  ondansetron (ZOFRAN) 8 MG tablet Zofran 8 mg tablet  Take 1 tablet every 8 hours by oral route as needed.    [provider]  oxyCODONE-acetaminophen (PERCOCET/ROXICET) 5-325 MG tablet Take 1-2 tablets by mouth every 4 (four) hours as needed for moderate pain. 02/17/17   Duanne Guess, PA-C  pantoprazole (PROTONIX) 40 MG tablet Take one tablet by mouth twice daily Patient taking differently: TAKE 1 TABLET (40 MG) BY MOUTH ONCE DAILY IN THE MORNING. 12/18/13   Marletta Lor, MD  PARoxetine (PAXIL) 10 MG tablet Take 1 tablet (10 mg total) by mouth at bedtime. 05/26/17   Rubie Maid, MD  Polyvinyl Alcohol-Povidone (REFRESH OP) Place 1 drop into both eyes 2 (two) times daily.    [provider]  Polyvinyl Alcohol-Povidone PF 1.4-0.6 % SOLN Apply to eye.    [provider]   predniSONE (DELTASONE) 10 MG tablet prednisone 10 mg tablet  1 daily    [provider]  Probiotic Product (PROBIOTIC PO) Probiotic 10 billion cell capsule  daily    [provider]  silver sulfADIAZINE (SILVADENE) 1 % cream silver sulfadiazine 1 % topical cream    [provider]  Sodium Chloride 3 % AERS Saline Nasal Mist 3 %  spray nose deeply every hour or two and blow nose    [provider]  sucralfate (CARAFATE) 1 g tablet sucralfate 1 gram tablet    [provider]  topiramate (TOPAMAX) 100 MG tablet topiramate 100 mg tablet  TAKE 1 TABLET BY MOUTH 2 TIMES DAILY.    [provider]  triamcinolone cream (KENALOG) 0.1 % triamcinolone acetonide 0.1 % topical cream    [provider]  valACYclovir (VALTREX) 500 MG tablet valacyclovir 500 mg tablet  1 daily    [provider]    Allergies Latex; Prasterone; Shellfish-derived products; Bee venom; Compazine [prochlorperazine edisylate]; Dhea [nutritional supplements]; Other; Prochlorperazine; Promethazine; Promethazine hcl; Clarithromycin; Fish allergy; Hydromorphone hcl; and Penicillins  Family History  Problem Relation Age of Onset  . Hypertension Mother   . Arthritis Mother   . Asthma Sister   . Asthma Daughter   . Pancreatic cancer Father   . Diabetes Maternal Grandmother   . Heart disease Maternal Grandmother   . Colon cancer Maternal Uncle 5  . Crohn's disease Maternal Aunt   . Diabetes Maternal Aunt   . Breast cancer Cousin 68       maternal    Social History Social History  Substance Use Topics  . Smoking status: Never Smoker  . Smokeless tobacco: Never Used  . Alcohol use No     Review of Systems  Constitutional: No fever/chills Cardiovascular: No chest pain. Respiratory: No SOB. Gastrointestinal: No abdominal pain.  No nausea, no vomiting.  Musculoskeletal: Positive for hip pain. Skin: Negative for rash, abrasions, lacerations,  ecchymosis. Neurological: Negative for headaches, numbness or tingling   ____________________________________________   PHYSICAL EXAM:  VITAL SIGNS: ED Triage Vitals [08/04/17 0916]  Enc Vitals Group     BP (!) 132/53     Pulse Rate 100     Resp 18     Temp 98.6 F (37 C)     Temp Source Oral     SpO2 98 %     Weight 224 lb (  101.6 kg)     Height 5\' 10"  (1.778 m)     Head Circumference      Peak Flow      Pain Score 9     Pain Loc      Pain Edu?      Excl. in [place]?      Constitutional: Alert and oriented. Well appearing and in no acute distress. Eyes: Conjunctivae are normal. PERRL. EOMI. Head: Atraumatic. ENT:      Ears:      Nose: No congestion/rhinnorhea.      Mouth/Throat: Mucous membranes are moist.  Neck: No stridor.   Cardiovascular: Normal rate, regular rhythm.  Good peripheral circulation. Respiratory: Normal respiratory effort without tachypnea or retractions. Lungs CTAB. Good air entry to the bases with no decreased or absent breath sounds. Musculoskeletal: Full range of motion to all extremities. No gross deformities appreciated. No tenderness to palpation over low back. Tenderness to palpation over right trochanteric bursa. Pain with external rotation of right hip. Strength 5 out of 5 in lower extremities bilaterally.  Neurologic:  Normal speech and language. No gross focal neurologic deficits are appreciated.  Skin:  Skin is warm, dry and intact. No rash noted.  ____________________________________________   LABS (all labs ordered are listed, but only abnormal results are displayed)  Labs Reviewed - No data to display ____________________________________________  EKG   ____________________________________________  RADIOLOGY Robinette Haines, personally viewed and evaluated these images (plain radiographs) as part of my medical decision making, as well as reviewing the written report by the radiologist.  Dg Hip Unilat W Or Wo Pelvis 2-3 Views  Right  Result Date: 08/04/2017 CLINICAL DATA:  Right hip and groin pain. EXAM: DG HIP (WITH OR WITHOUT PELVIS) 2-3V RIGHT COMPARISON:  02/16/2017 FINDINGS: Frontal pelvis shows no fracture. SI joints and symphysis pubis unremarkable. AP and frog-leg lateral views of the right hip show evidence of prior dynamic hip screw placement. No evidence for femoral neck fracture. IMPRESSION: No acute bony abnormality. Electronically Signed   By: Misty Stanley M.D.   On: 08/04/2017 10:43    ____________________________________________    PROCEDURES  Procedure(s) performed:    Procedures    Medications  lidocaine (LIDODERM) 5 % 1 patch (1 patch Transdermal Patch Applied 08/04/17 1125)  ketorolac (TORADOL) injection 30 mg (30 mg Intramuscular Given 08/04/17 1125)     ____________________________________________   INITIAL IMPRESSION / ASSESSMENT AND PLAN / ED COURSE  Pertinent labs & imaging results that were available during my care of the patient were reviewed by me and considered in my medical decision making (see chart for details).  Review of the [place] CSRS was performed in accordance of the [place] prior to dispensing any controlled drugs.  She presented to the emergency department with right hip pain for one day. Vital signs and exam are reassuring. X-ray negative for acute abnormalities. Symptoms are consistent with bursitis. He was given Toradol shot and lidoderm in ED. She felt better after medications. Patient will be discharged home with prescriptions for ibuprofen. Patient is to follow up with orthopedics as directed. She saw Dr. Kyla Balzarine for her hip surgery previously and will see him. Patient is given ED precautions to return to the ED for any worsening or new symptoms.     ____________________________________________  FINAL CLINICAL IMPRESSION(S) / ED DIAGNOSES  Final diagnoses:  Pain of right hip joint      NEW MEDICATIONS STARTED DURING THIS VISIT:  Discharge Medication List as  of 08/04/2017 12:10  PM    START taking these medications   Details  ibuprofen (ADVIL,MOTRIN) 800 MG tablet Take 1 tablet (800 mg total) by mouth every 8 (eight) hours as needed., Starting Wed 08/04/2017, Print    lidocaine (LIDODERM) 5 % Place 1 patch onto the skin every 12 (twelve) hours. Remove & Discard patch within 12 hours or as directed by MD, Starting Wed 08/04/2017, Until Thu 08/04/2018, Print            This chart was dictated using voice recognition software/Dragon. Despite best efforts to proofread, errors can occur which can change the meaning. Any change was purely unintentional.    Laban Emperor, PA-C 08/04/17 [place], [place], MD 08/05/17 [phone]

## 2017-08-04 NOTE — ED Notes (Signed)
Pt taken to parking lot in wheelchair for convenience. Pt able to ambulate with cane without difficulty. NAD noted at this time and pt verbalized understanding of discharge papers and follow ups as needed.

## 2017-08-04 NOTE — ED Triage Notes (Signed)
Pt states right hip pain that began yesterday, states hx of fracture and recently had hard ward removed in March, states when she sits [place] style or tries to put her shoes on the pain is worse, pt uses cane at baseline, tremors presenet but states hx of same

## 2017-08-09 DIAGNOSIS — N390 Urinary tract infection, site not specified: Secondary | ICD-10-CM | POA: Diagnosis not present

## 2017-08-24 DIAGNOSIS — R0602 Shortness of breath: Secondary | ICD-10-CM | POA: Diagnosis not present

## 2017-08-24 DIAGNOSIS — R079 Chest pain, unspecified: Secondary | ICD-10-CM | POA: Diagnosis not present

## 2017-08-26 DIAGNOSIS — M329 Systemic lupus erythematosus, unspecified: Secondary | ICD-10-CM | POA: Diagnosis not present

## 2017-08-26 DIAGNOSIS — K219 Gastro-esophageal reflux disease without esophagitis: Secondary | ICD-10-CM | POA: Diagnosis not present

## 2017-08-26 DIAGNOSIS — R079 Chest pain, unspecified: Secondary | ICD-10-CM | POA: Diagnosis not present

## 2017-08-26 DIAGNOSIS — K921 Melena: Secondary | ICD-10-CM | POA: Diagnosis not present

## 2017-08-26 DIAGNOSIS — K2970 Gastritis, unspecified, without bleeding: Secondary | ICD-10-CM | POA: Diagnosis not present

## 2017-08-26 DIAGNOSIS — R1013 Epigastric pain: Secondary | ICD-10-CM | POA: Diagnosis not present

## 2017-08-26 DIAGNOSIS — R0602 Shortness of breath: Secondary | ICD-10-CM | POA: Diagnosis not present

## 2017-08-27 ENCOUNTER — Ambulatory Visit: Payer: Medicare Other | Admitting: Anesthesiology

## 2017-08-27 ENCOUNTER — Encounter
Admission: RE | Disposition: A | Discharge status: Home / Self Care | Payer: Self-pay | Source: Ambulatory Visit | Attending: Unknown Physician Specialty

## 2017-08-27 ENCOUNTER — Ambulatory Visit
Admission: RE | Admit: 2017-08-27 | Discharge: 2017-08-27 | Disposition: A | Discharge status: Home / Self Care | Payer: Medicare Other | Source: Ambulatory Visit | Attending: Unknown Physician Specialty | Admitting: Unknown Physician Specialty

## 2017-08-27 ENCOUNTER — Encounter: Payer: Self-pay | Admitting: *Deleted

## 2017-08-27 DIAGNOSIS — Z7951 Long term (current) use of inhaled steroids: Secondary | ICD-10-CM | POA: Diagnosis not present

## 2017-08-27 DIAGNOSIS — K2950 Unspecified chronic gastritis without bleeding: Secondary | ICD-10-CM | POA: Insufficient documentation

## 2017-08-27 DIAGNOSIS — E1140 Type 2 diabetes mellitus with diabetic neuropathy, unspecified: Secondary | ICD-10-CM | POA: Insufficient documentation

## 2017-08-27 DIAGNOSIS — Z880 Allergy status to penicillin: Secondary | ICD-10-CM | POA: Insufficient documentation

## 2017-08-27 DIAGNOSIS — K921 Melena: Secondary | ICD-10-CM | POA: Diagnosis not present

## 2017-08-27 DIAGNOSIS — J45909 Unspecified asthma, uncomplicated: Secondary | ICD-10-CM | POA: Insufficient documentation

## 2017-08-27 DIAGNOSIS — E039 Hypothyroidism, unspecified: Secondary | ICD-10-CM | POA: Insufficient documentation

## 2017-08-27 DIAGNOSIS — F329 Major depressive disorder, single episode, unspecified: Secondary | ICD-10-CM | POA: Diagnosis not present

## 2017-08-27 DIAGNOSIS — Z7982 Long term (current) use of aspirin: Secondary | ICD-10-CM | POA: Diagnosis not present

## 2017-08-27 DIAGNOSIS — Z8541 Personal history of malignant neoplasm of cervix uteri: Secondary | ICD-10-CM | POA: Insufficient documentation

## 2017-08-27 DIAGNOSIS — Z91013 Allergy to seafood: Secondary | ICD-10-CM | POA: Insufficient documentation

## 2017-08-27 DIAGNOSIS — K219 Gastro-esophageal reflux disease without esophagitis: Secondary | ICD-10-CM | POA: Insufficient documentation

## 2017-08-27 DIAGNOSIS — M329 Systemic lupus erythematosus, unspecified: Secondary | ICD-10-CM | POA: Diagnosis not present

## 2017-08-27 DIAGNOSIS — M797 Fibromyalgia: Secondary | ICD-10-CM | POA: Diagnosis not present

## 2017-08-27 DIAGNOSIS — G250 Essential tremor: Secondary | ICD-10-CM | POA: Diagnosis not present

## 2017-08-27 DIAGNOSIS — K449 Diaphragmatic hernia without obstruction or gangrene: Secondary | ICD-10-CM | POA: Diagnosis not present

## 2017-08-27 DIAGNOSIS — F419 Anxiety disorder, unspecified: Secondary | ICD-10-CM | POA: Diagnosis not present

## 2017-08-27 DIAGNOSIS — K2970 Gastritis, unspecified, without bleeding: Secondary | ICD-10-CM | POA: Diagnosis not present

## 2017-08-27 DIAGNOSIS — Z79899 Other long term (current) drug therapy: Secondary | ICD-10-CM | POA: Diagnosis not present

## 2017-08-27 DIAGNOSIS — Z91040 Latex allergy status: Secondary | ICD-10-CM | POA: Diagnosis not present

## 2017-08-27 DIAGNOSIS — E119 Type 2 diabetes mellitus without complications: Secondary | ICD-10-CM | POA: Diagnosis not present

## 2017-08-27 DIAGNOSIS — K2960 Other gastritis without bleeding: Secondary | ICD-10-CM | POA: Diagnosis not present

## 2017-08-27 DIAGNOSIS — K2900 Acute gastritis without bleeding: Secondary | ICD-10-CM | POA: Diagnosis not present

## 2017-08-27 DIAGNOSIS — Z91030 Bee allergy status: Secondary | ICD-10-CM | POA: Insufficient documentation

## 2017-08-27 HISTORY — PX: ESOPHAGOGASTRODUODENOSCOPY (EGD) WITH PROPOFOL: SHX5813

## 2017-08-27 LAB — POCT PREGNANCY, URINE: PREG TEST UR: NEGATIVE

## 2017-08-27 SURGERY — ESOPHAGOGASTRODUODENOSCOPY (EGD) WITH PROPOFOL
Anesthesia: General

## 2017-08-27 MED ORDER — PROPOFOL 500 MG/50ML IV EMUL
INTRAVENOUS | Status: DC | PRN
  Administered 2017-08-27: 100 ug/kg/min via INTRAVENOUS

## 2017-08-27 MED ORDER — SODIUM CHLORIDE 0.9 % IV SOLN: INTRAVENOUS | Status: DC

## 2017-08-27 MED ORDER — FENTANYL CITRATE (PF) 100 MCG/2ML IJ SOLN
INTRAMUSCULAR | Status: AC
  Filled 2017-08-27: qty 2

## 2017-08-27 MED ORDER — FENTANYL CITRATE (PF) 100 MCG/2ML IJ SOLN
INTRAMUSCULAR | Status: DC | PRN
  Administered 2017-08-27: 100 ug via INTRAVENOUS

## 2017-08-27 MED ORDER — IPRATROPIUM-ALBUTEROL 0.5-2.5 (3) MG/3ML IN SOLN
RESPIRATORY_TRACT | Status: AC
  Administered 2017-08-27: 3 mL via RESPIRATORY_TRACT
  Filled 2017-08-27: qty 3

## 2017-08-27 MED ORDER — IPRATROPIUM-ALBUTEROL 0.5-2.5 (3) MG/3ML IN SOLN
3.0000 mL | Freq: Once | RESPIRATORY_TRACT | Status: AC
  Administered 2017-08-27: 3 mL via RESPIRATORY_TRACT

## 2017-08-27 MED ORDER — SODIUM CHLORIDE 0.9 % IV SOLN
INTRAVENOUS | Status: DC
  Administered 2017-08-27: 15:00:00 via INTRAVENOUS

## 2017-08-27 MED ORDER — MIDAZOLAM HCL 5 MG/5ML IJ SOLN
INTRAMUSCULAR | Status: DC | PRN
  Administered 2017-08-27: 2 mg via INTRAVENOUS

## 2017-08-27 MED ORDER — PROPOFOL 10 MG/ML IV BOLUS
INTRAVENOUS | Status: DC | PRN
  Administered 2017-08-27: 50 mg via INTRAVENOUS

## 2017-08-27 MED ORDER — GLYCOPYRROLATE 0.2 MG/ML IJ SOLN
INTRAMUSCULAR | Status: AC
  Filled 2017-08-27: qty 1

## 2017-08-27 MED ORDER — LIDOCAINE HCL (PF) 2 % IJ SOLN
INTRAMUSCULAR | Status: DC | PRN
  Administered 2017-08-27: 80 mg

## 2017-08-27 MED ORDER — MIDAZOLAM HCL 2 MG/2ML IJ SOLN
INTRAMUSCULAR | Status: AC
  Filled 2017-08-27: qty 2

## 2017-08-27 MED ORDER — LIDOCAINE HCL (PF) 2 % IJ SOLN
INTRAMUSCULAR | Status: AC
  Filled 2017-08-27: qty 4

## 2017-08-27 NOTE — Anesthesia Preprocedure Evaluation (Signed)
Anesthesia Evaluation  Patient identified by MRN, date of birth, ID band Patient awake    Reviewed: Allergy & Precautions, NPO status , Patient's Chart, lab work & pertinent test results  History of Anesthesia Complications Negative for: history of anesthetic complications  Airway Mallampati: I  TM Distance: >3 FB Neck ROM: Full    Dental no notable dental hx.    Pulmonary asthma ,    breath sounds clear to auscultation- rhonchi (-) wheezing      Cardiovascular Exercise Tolerance: Good (-) hypertension(-) CAD, (-) Past MI and (-) Cardiac Stents  Rhythm:Regular Rate:Normal - Systolic murmurs and - Diastolic murmurs    Neuro/Psych  Headaches, PSYCHIATRIC DISORDERS Anxiety Depression    GI/Hepatic Neg liver ROS, hiatal hernia, GERD  ,  Endo/Other  diabetesHypothyroidism   Renal/GU negative Renal ROS     Musculoskeletal  (+) Fibromyalgia -  Abdominal (+) - obese,   Peds  Hematology negative hematology ROS (+)   Anesthesia Other Findings Past Medical History: 06/06/2007: ANXIETY No date: Asthma 02/22/2009: BACK PAIN No date: Cervical cancer ([place]) 06/06/2007: DEPRESSION No date: Diabetes mellitus without complication ([facility])     Comment:  patient denies No date: Essential tremor No date: Fibromyalgia 07/07/2007: FREQUENCY, URINARY No date: Gastritis 06/06/2007: GERD No date: Headache No date: History of hiatal hernia No date: History of kidney stones 06/06/2007: HYPOTHYROIDISM 09/11/2010: INTERSTITIAL CYSTITIS 2006: Lupus No date: Lupus 11/05/2010: NEPHROLITHIASIS, HX OF No date: Neuropathy 10/14/2009: OSTEOPENIA 10/14/2009: OVERACTIVE BLADDER 06/06/2007: PAIN, CHRONIC NEC 08/19/2007: POLYARTHRITIS 11/08/2007: UNSPECIFIED OPTIC NEURITIS 02/22/2009: WEIGHT GAIN   Reproductive/Obstetrics                             Anesthesia Physical Anesthesia Plan  ASA: III  Anesthesia Plan: General    Post-op Pain Management:    Induction: Intravenous  PONV Risk Score and Plan: 2 and Propofol infusion  Airway Management Planned: Natural Airway  Additional Equipment:   Intra-op Plan:   Post-operative Plan:   Informed Consent: I have reviewed the patients History and Physical, chart, labs and discussed the procedure including the risks, benefits and alternatives for the proposed anesthesia with the patient or authorized representative who has indicated his/her understanding and acceptance.   Dental advisory given  Plan Discussed with: CRNA and Anesthesiologist  Anesthesia Plan Comments:         Anesthesia Quick Evaluation

## 2017-08-27 NOTE — H&P (Signed)
Primary Care Physician:  Hortencia Conradi, MD Primary Gastroenterologist:  Dr. Vira Agar  Pre-Procedure History & Physical: HPI:  [name] [name] is a 51 y.o. female is here for an endoscopy.   Past Medical History:  Diagnosis Date  . ANXIETY 06/06/2007  . Asthma   . BACK PAIN 02/22/2009  . Cervical cancer ([place])   . DEPRESSION 06/06/2007  . Diabetes mellitus without complication ([place])    patient denies  . Essential tremor   . Fibromyalgia   . FREQUENCY, URINARY 07/07/2007  . Gastritis   . GERD 06/06/2007  . Headache   . History of hiatal hernia   . History of kidney stones   . HYPOTHYROIDISM 06/06/2007  . INTERSTITIAL CYSTITIS 09/11/2010  . Lupus 2006  . Lupus   . NEPHROLITHIASIS, HX OF 11/05/2010  . Neuropathy   . OSTEOPENIA 10/14/2009  . OVERACTIVE BLADDER 10/14/2009  . PAIN, CHRONIC NEC 06/06/2007  . POLYARTHRITIS 08/19/2007  . 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 WEIGHT GAIN 02/22/2009    Past Surgical History:  Procedure Laterality Date  . APPENDECTOMY    . CERVICAL BIOPSY  W/ LOOP ELECTRODE EXCISION  2005  . HARDWARE REMOVAL Right 02/16/2017   Procedure: HARDWARE REMOVAL FROM HIP;  Surgeon: Hessie Knows, MD;  Location: [facility] ORS;  Service: Orthopedics;  Laterality: Right;  . JOINT REPLACEMENT    . [place] ARTHROPLASTY  06-2009  . TONSILLECTOMY    . TONSILLECTOMY    . wrist  ligament repair bilateral    . WRIST RECONSTRUCTION      Prior to Admission medications   Medication Sig Start Date End Date Taking? Authorizing Provider  albuterol (PROAIR HFA) 108 (90 Base) MCG/ACT inhaler ProAir HFA 90 mcg/actuation aerosol inhaler   Yes [provider]  ARIPiprazole (ABILIFY) 20 MG tablet Take 20 mg by mouth daily.   Yes [provider]  Ascorbic Acid (VITAMIN C) 500 MG CHEW Chew 2,500 mg by mouth daily.   Yes [provider]  aspirin 325 MG tablet Take 1 tablet (325 mg total) by mouth daily. 02/18/17  Yes Duanne Guess, PA-C   Cholecalciferol (VITAMIN D3) 2000 units capsule Vitamin D3 2,000 unit capsule   Yes [provider]  dapsone 25 MG tablet Take by mouth.   Yes [provider]  diclofenac sodium (VOLTAREN) 1 % GEL Apply 2 g topically 4 (four) times daily as needed (for knee & finger pain.).    Yes [provider]  DULoxetine (CYMBALTA) 60 MG capsule duloxetine 60 mg capsule,delayed release  1 twice daily   Yes [provider]  etodolac (LODINE) 500 MG tablet etodolac 500 mg tablet   Yes [provider]  fentaNYL (DURAGESIC - DOSED MCG/HR) 75 MCG/HR Place 75 mcg onto the skin every 3 (three) days.   Yes [provider]  folic acid (FOLVITE) 1 MG tablet Take 1 mg by mouth daily.   Yes [provider]  hydroxychloroquine (PLAQUENIL) 200 MG tablet Plaquenil 200 mg tablet  Take 1 tablet twice a day by oral route after meals for 90 days.   Yes [provider]  levothyroxine (SYNTHROID, LEVOTHROID) 25 MCG tablet Take 1 tablet (25 mcg total) by mouth daily before breakfast. 03/15/14  Yes Marletta Lor, MD  LORazepam (ATIVAN) 2 MG tablet Take 1-2 mg by mouth See admin instructions. 1 MG DAILY AS NEEDED FOR ANXIETY & SCHEDULED AT BEDTIME EACH NIGHT   Yes [provider]  montelukast (SINGULAIR) 10 MG tablet  montelukast 10 mg tablet  TAKE 1 TABLET BY MOUTH DAILY   Yes [provider]  Multiple Vitamins-Minerals (MULTI-VITAMIN GUMMIES PO) Multi Vitamin  GUMMIES   Yes [provider]  ondansetron (ZOFRAN) 8 MG tablet Zofran 8 mg tablet  Take 1 tablet every 8 hours by oral route as needed.   Yes [provider]  oxyCODONE-acetaminophen (PERCOCET/ROXICET) 5-325 MG tablet Take 1-2 tablets by mouth every 4 (four) hours as needed for moderate pain. 02/17/17  Yes Duanne Guess, PA-C  pantoprazole (PROTONIX) 40 MG tablet Take one tablet by mouth twice daily Patient taking differently: TAKE 1 TABLET (40 MG) BY MOUTH  ONCE DAILY IN THE MORNING. 12/18/13  Yes Marletta Lor, MD  PARoxetine (PAXIL) 10 MG tablet Take 1 tablet (10 mg total) by mouth at bedtime. 05/26/17  Yes Rubie Maid, MD  predniSONE (DELTASONE) 10 MG tablet prednisone 10 mg tablet  1 daily   Yes [provider]  topiramate (TOPAMAX) 100 MG tablet topiramate 100 mg tablet  TAKE 1 TABLET BY MOUTH 2 TIMES DAILY.   Yes [provider]  albuterol (VENTOLIN HFA) 108 (90 Base) MCG/ACT inhaler Ventolin HFA 90 mcg/actuation aerosol inhaler  INHALE 2 PUFFS (180 MCG) BY INHALATION ROUTE EVERY 4 HOURS    [provider]  Biotin 10 MG CAPS Take by mouth.    [provider]  Biotin 5000 MCG SUBL Place 10,000 mcg under the tongue daily.    [provider]  budesonide-formoterol (SYMBICORT) 160-4.5 MCG/ACT inhaler Symbicort 160 mcg-4.5 mcg/actuation HFA aerosol inhaler    [provider]  calcipotriene (DOVONOX) 0.005 % ointment calcipotriene 0.005 % topical ointment  APPLY A THIN LAYER TO THE AFFECTED AREA(S) BY TOPICAL ROUTE ONCE DAILY ; RUB IN GENTLY AND COMPLETELY    [provider]  dexmethylphenidate (FOCALIN XR) 20 MG 24 hr capsule dexmethylphenidate ER 20 mg capsule,extended release biphasic50-50    [provider]  dicyclomine (BENTYL) 10 MG capsule dicyclomine 10 mg capsule  every 8hours as needed    [provider]  EPINEPHrine (EPIPEN 2-PAK) 0.3 mg/0.3 mL IJ SOAJ injection EpiPen 2-Pak 0.3 mg/0.3 mL injection, auto-injector    [provider]  etodolac (LODINE) 500 MG tablet Take by mouth. 05/25/17 11/24/17  [provider]  fluocinonide ointment (LIDEX) 0.05 % fluocinonide 0.05 % topical ointment  APPLY TO THE AFFECTED AREA(S) BY TOPICAL ROUTE 2 TIMES PER DAY    [provider]  hydrocortisone (PROCTOZONE-HC) 2.5 % rectal cream Proctozone-HC 2.5 % topical cream perineal applicator    [provider]  ibuprofen (ADVIL,MOTRIN)  800 MG tablet Take 1 tablet (800 mg total) by mouth every 8 (eight) hours as needed. Patient not taking: Reported on 08/27/2017 08/04/17   Laban Emperor, PA-C  imiquimod (ALDARA) 5 % cream imiquimod 5 % topical cream packet  APPLY TO THE AFFECTED AREA(S) BY TOPICAL ROUTE 3 TIMES PER WEEK at bedtime    [provider]  lidocaine (LIDODERM) 5 % Place 1 patch onto the skin every 12 (twelve) hours. Remove & Discard patch within 12 hours or as directed by MD 08/04/17 08/04/18  Laban Emperor, PA-C  Lifitegrast 5 % SOLN Apply to eye.    [provider]  nystatin [facility]) powder Nyamyc 100,000 unit/gram topical powder    [provider]  Polyvinyl Alcohol-Povidone (REFRESH OP) Place 1 drop into both eyes 2 (two) times daily.    [provider]  Polyvinyl Alcohol-Povidone PF 1.4-0.6 % SOLN Apply to  eye.    [provider]  Probiotic Product (PROBIOTIC PO) Probiotic 10 billion cell capsule  daily    [provider]  silver sulfADIAZINE (SILVADENE) 1 % cream silver sulfadiazine 1 % topical cream    [provider]  Sodium Chloride 3 % AERS Saline Nasal Mist 3 %  spray nose deeply every hour or two and blow nose    [provider]  sucralfate (CARAFATE) 1 g tablet sucralfate 1 gram tablet    [provider]  triamcinolone cream (KENALOG) 0.1 % triamcinolone acetonide 0.1 % topical cream    [provider]  valACYclovir (VALTREX) 500 MG tablet valacyclovir 500 mg tablet  1 daily    [provider]    Allergies as of 08/26/2017 - Review Complete 08/04/2017  Allergen Reaction Noted  . Latex Anaphylaxis and Rash 03/19/2011  . Prasterone Other (See Comments) and Nausea And Vomiting 01/16/2013  . Shellfish-derived products Hives, Other (See Comments), Rash, and Swelling 01/31/2015  . Bee venom Itching   . Compazine [prochlorperazine edisylate] Other (See Comments) 06/19/2014  . Dhea [nutritional supplements] Other  (See Comments) 01/16/2013  . Other    . Prochlorperazine  02/03/2016  . Promethazine  02/03/2016  . Promethazine hcl Other (See Comments) 06/23/2007  . Clarithromycin Hives and Rash 02/03/2016  . Fish allergy Hives, Swelling, and Rash 04/06/2013  . Hydromorphone hcl Rash and Hives 08/27/2013  . Penicillins Rash and Other (See Comments)     Family History  Problem Relation Age of Onset  . Hypertension Mother   . Arthritis Mother   . Asthma Sister   . Asthma Daughter   . Pancreatic cancer Father   . Diabetes Maternal Grandmother   . Heart disease Maternal Grandmother   . Colon cancer Maternal Uncle 66  . Crohn's disease Maternal Aunt   . Diabetes Maternal Aunt   . Breast cancer Cousin 82       maternal    Social History   Social History  . Marital status: Divorced    Spouse name: N/A  . Number of children: 1  . Years of education: N/A   Occupational History  . DISABLED Unemployed   Social History Main Topics  . Smoking status: Never Smoker  . Smokeless tobacco: Never Used  . Alcohol use No  . Drug use: No  . Sexual activity: Not Currently    Birth control/ protection: None, Post-menopausal   Other Topics Concern  . 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   She lives with daughter (40) and mother (52).          Review of Systems: See HPI, otherwise negative ROS  Physical Exam: BP 106/64   Pulse (!) 58   Temp (!) 97 F (36.1 C) (Tympanic)   Resp 16   LMP 01/02/2016 (Exact Date)   SpO2 100%  General:   Alert,  pleasant and cooperative in NAD Head:  Normocephalic and atraumatic. Neck:  Supple; no masses or thyromegaly. Lungs:  Clear throughout to auscultation.    Heart:  Regular rate and rhythm. Abdomen:  Soft, nontender and nondistended. Normal bowel sounds, without guarding, and without rebound.   Neurologic:  Alert and  oriented x4;  grossly normal  neurologically.  Impression/Plan: [name] [name] is here for an endoscopy to be performed for melena  Risks, benefits, limitations, and alternatives regarding  endoscopy have been reviewed with the patient.  Questions have been answered.  All parties agreeable.   Gaylyn Cheers, MD  08/27/2017, 3:05 PM

## 2017-08-27 NOTE — Transfer of Care (Signed)
Immediate Anesthesia Transfer of Care Note  Patient: [name] [name]  Procedure(s) Performed: Procedure(s): ESOPHAGOGASTRODUODENOSCOPY (EGD) WITH PROPOFOL (N/A)  Patient Location: PACU  Anesthesia Type:General  Level of Consciousness: sedated  Airway & Oxygen Therapy: Patient Spontanous Breathing and Patient connected to nasal cannula oxygen  Post-op Assessment:  Post vital signs: Reviewed and stable  Last Vitals:  Vitals:   08/27/17 1432 08/27/17 1454  BP: 101/62 106/64  Pulse: 78 (!) 58  Resp: 18 16  Temp: (!) 36.1 C   SpO2: 99% 100%    Last Pain:  Vitals:   08/27/17 1432  TempSrc: Tympanic         Complications: No apparent anesthesia complications

## 2017-08-27 NOTE — Anesthesia Post-op Follow-up Note (Signed)
Anesthesia QCDR form completed.        

## 2017-08-27 NOTE — Progress Notes (Signed)
Data entered on wrong patient starting at 38

## 2017-08-27 NOTE — Anesthesia Postprocedure Evaluation (Signed)
Anesthesia Post Note  Patient: [name] [name]  Procedure(s) Performed: Procedure(s) (LRB): ESOPHAGOGASTRODUODENOSCOPY (EGD) WITH PROPOFOL (N/A)  Patient location during evaluation: Endoscopy Anesthesia Type: General Level of consciousness: awake and alert Pain management: pain level controlled Vital Signs Assessment: post-procedure vital signs reviewed and stable Respiratory status: spontaneous breathing and respiratory function stable Cardiovascular status: stable Anesthetic complications: no     Last Vitals:  Vitals:   08/27/17 1454 08/27/17 1525  BP: 106/64 (!) 116/59  Pulse: (!) 58 74  Resp: 16 12  Temp:  (!) 36.2 C  SpO2: 100% 100%    Last Pain:  Vitals:   08/27/17 1525  TempSrc: Tympanic                 Allecia Bells K

## 2017-08-27 NOTE — Op Note (Signed)
[facility] Gastroenterology Patient Name: [name] [name] Procedure Date: 08/27/2017 2:36 PM MRN: [id number] Account #: [id number] Date of Birth: 1966/02/04 Admit Type: Outpatient Age: 51 Room: [facility] ENDO ROOM 3 Gender: Female Note Status: Finalized Procedure:            Upper GI endoscopy Indications:          Melena Providers:            Manya Silvas, MD Medicines:            Propofol per Anesthesia Complications:        No immediate complications. Procedure:            Pre-Anesthesia Assessment:                       - After reviewing the risks and benefits, the patient                        was deemed in satisfactory condition to undergo the                        procedure.                       After obtaining informed consent, the endoscope was                        passed under direct vision. Throughout the procedure,                        the patient's blood pressure, pulse, and oxygen                        saturations were monitored continuously. The Endoscope                        was introduced through the mouth, and advanced to the                        second part of duodenum. The upper GI endoscopy was                        accomplished without difficulty. The patient tolerated                        the procedure well. Findings:      A small hiatal hernia was present. GEJ 36cm.      Patchy moderate inflammation characterized by erythema and granularity       was found in the gastric antrum. Biopsies were taken with a cold forceps       for histology. Biopsies were taken with a cold forceps for Helicobacter       pylori testing. Body shows no inflammation.      The first portion of the duodenum and second portion of the duodenum       were normal. Impression:           - Small hiatal hernia.                       - Gastritis. Biopsied.                       -  Normal first portion of the duodenum and second     portion of the duodenum. Recommendation:       - Await pathology results. Take PPI 1-2 times a day                        (Nexium, Prilosec, Protonix, Omeprazole. Manya Silvas, MD 08/27/2017 3:28:18 PM This report has been signed electronically. Number of Addenda: 0 Note Initiated On: 08/27/2017 2:36 PM      [facility]

## 2017-08-30 ENCOUNTER — Other Ambulatory Visit: Payer: Self-pay | Admitting: Obstetrics and Gynecology

## 2017-08-30 DIAGNOSIS — N951 Menopausal and female climacteric states: Secondary | ICD-10-CM

## 2017-08-31 LAB — SURGICAL PATHOLOGY

## 2017-09-13 DIAGNOSIS — F3181 Bipolar II disorder: Secondary | ICD-10-CM | POA: Diagnosis not present

## 2017-09-23 DIAGNOSIS — I7300 Raynaud's syndrome without gangrene: Secondary | ICD-10-CM | POA: Diagnosis not present

## 2017-09-23 DIAGNOSIS — S72001D Fracture of unspecified part of neck of right femur, subsequent encounter for closed fracture with routine healing: Secondary | ICD-10-CM | POA: Diagnosis not present

## 2017-09-23 DIAGNOSIS — M797 Fibromyalgia: Secondary | ICD-10-CM | POA: Diagnosis not present

## 2017-09-23 DIAGNOSIS — E038 Other specified hypothyroidism: Secondary | ICD-10-CM | POA: Diagnosis not present

## 2017-09-23 DIAGNOSIS — Z9225 Personal history of immunosupression therapy: Secondary | ICD-10-CM | POA: Diagnosis not present

## 2017-09-23 DIAGNOSIS — Z79899 Other long term (current) drug therapy: Secondary | ICD-10-CM | POA: Diagnosis not present

## 2017-09-23 DIAGNOSIS — G43909 Migraine, unspecified, not intractable, without status migrainosus: Secondary | ICD-10-CM | POA: Diagnosis not present

## 2017-09-23 DIAGNOSIS — K219 Gastro-esophageal reflux disease without esophagitis: Secondary | ICD-10-CM | POA: Diagnosis not present

## 2017-09-23 DIAGNOSIS — M3500 Sicca syndrome, unspecified: Secondary | ICD-10-CM | POA: Diagnosis not present

## 2017-09-23 DIAGNOSIS — M7061 Trochanteric bursitis, right hip: Secondary | ICD-10-CM | POA: Diagnosis not present

## 2017-10-04 DIAGNOSIS — F3181 Bipolar II disorder: Secondary | ICD-10-CM | POA: Diagnosis not present

## 2017-10-05 DIAGNOSIS — R251 Tremor, unspecified: Secondary | ICD-10-CM | POA: Diagnosis not present

## 2017-10-05 DIAGNOSIS — G43119 Migraine with aura, intractable, without status migrainosus: Secondary | ICD-10-CM | POA: Diagnosis not present

## 2017-10-05 DIAGNOSIS — R200 Anesthesia of skin: Secondary | ICD-10-CM | POA: Diagnosis not present

## 2017-10-11 ENCOUNTER — Encounter: Payer: Self-pay | Admitting: Emergency Medicine

## 2017-10-11 ENCOUNTER — Emergency Department: Payer: Medicare Other

## 2017-10-11 ENCOUNTER — Emergency Department
Admission: EM | Admit: 2017-10-11 | Discharge: 2017-10-11 | Disposition: A | Discharge status: Home / Self Care | Payer: Medicare Other | Attending: Emergency Medicine | Admitting: Emergency Medicine

## 2017-10-11 ENCOUNTER — Other Ambulatory Visit: Payer: Self-pay

## 2017-10-11 DIAGNOSIS — F329 Major depressive disorder, single episode, unspecified: Secondary | ICD-10-CM | POA: Diagnosis not present

## 2017-10-11 DIAGNOSIS — J45909 Unspecified asthma, uncomplicated: Secondary | ICD-10-CM | POA: Insufficient documentation

## 2017-10-11 DIAGNOSIS — R079 Chest pain, unspecified: Secondary | ICD-10-CM

## 2017-10-11 DIAGNOSIS — R0602 Shortness of breath: Secondary | ICD-10-CM

## 2017-10-11 DIAGNOSIS — Z91040 Latex allergy status: Secondary | ICD-10-CM | POA: Diagnosis not present

## 2017-10-11 DIAGNOSIS — E039 Hypothyroidism, unspecified: Secondary | ICD-10-CM | POA: Insufficient documentation

## 2017-10-11 DIAGNOSIS — F3181 Bipolar II disorder: Secondary | ICD-10-CM | POA: Diagnosis not present

## 2017-10-11 DIAGNOSIS — F419 Anxiety disorder, unspecified: Secondary | ICD-10-CM | POA: Diagnosis not present

## 2017-10-11 DIAGNOSIS — Z79899 Other long term (current) drug therapy: Secondary | ICD-10-CM | POA: Diagnosis not present

## 2017-10-11 DIAGNOSIS — Z96649 Presence of unspecified artificial hip joint: Secondary | ICD-10-CM | POA: Insufficient documentation

## 2017-10-11 DIAGNOSIS — R0781 Pleurodynia: Secondary | ICD-10-CM | POA: Diagnosis not present

## 2017-10-11 DIAGNOSIS — E119 Type 2 diabetes mellitus without complications: Secondary | ICD-10-CM | POA: Insufficient documentation

## 2017-10-11 DIAGNOSIS — G479 Sleep disorder, unspecified: Secondary | ICD-10-CM | POA: Diagnosis not present

## 2017-10-11 LAB — TROPONIN I: Troponin I: <0.03 ng/mL (ref <0.03)

## 2017-10-11 LAB — LIPASE, BLOOD: LIPASE: 31 U/L (ref 11–51)

## 2017-10-11 LAB — BASIC METABOLIC PANEL
ANION GAP: 10 (ref 5–15)
BUN: 16 mg/dL (ref 6–20)
CO2: 23 mmol/L (ref 22–32)
Calcium: 9.2 mg/dL (ref 8.9–10.3)
Chloride: 108 mmol/L (ref 101–111)
Creatinine, Ser: 0.87 mg/dL (ref 0.44–1.00)
GFR calc Af Amer: >60 mL/min (ref >60)
Glucose, Bld: 104 mg/dL — ABNORMAL HIGH (ref 65–99)
POTASSIUM: 4 mmol/L (ref 3.5–5.1)
SODIUM: 141 mmol/L (ref 135–145)

## 2017-10-11 LAB — APTT: aPTT: 29 seconds (ref 24–36)

## 2017-10-11 LAB — PROTIME-INR
INR: 0.98
PROTHROMBIN TIME: 12.9 s (ref 11.4–15.2)

## 2017-10-11 LAB — CBC
HEMATOCRIT: 40 % (ref 35.0–47.0)
HEMOGLOBIN: 12.5 g/dL (ref 12.0–16.0)
MCH: 27 pg (ref 26.0–34.0)
MCHC: 31.2 g/dL — ABNORMAL LOW (ref 32.0–36.0)
MCV: 86.8 fL (ref 80.0–100.0)
Platelets: 146 10*3/uL — ABNORMAL LOW (ref 150–440)
RBC: 4.61 MIL/uL (ref 3.80–5.20)
RDW: 18.2 % — ABNORMAL HIGH (ref 11.5–14.5)
WBC: 6.2 10*3/uL (ref 3.6–11.0)

## 2017-10-11 MED ORDER — KETOROLAC TROMETHAMINE 30 MG/ML IJ SOLN
30.0000 mg | Freq: Once | INTRAMUSCULAR | Status: AC
  Administered 2017-10-11: 30 mg via INTRAVENOUS

## 2017-10-11 MED ORDER — ACETAMINOPHEN 500 MG PO TABS
ORAL_TABLET | ORAL | Status: AC
  Administered 2017-10-11: 1000 mg via ORAL
  Filled 2017-10-11: qty 2

## 2017-10-11 MED ORDER — SODIUM CHLORIDE 0.9 % IV BOLUS (SEPSIS)
1000.0000 mL | Freq: Once | INTRAVENOUS | Status: AC
  Administered 2017-10-11: 1000 mL via INTRAVENOUS

## 2017-10-11 MED ORDER — KETOROLAC TROMETHAMINE 30 MG/ML IJ SOLN
INTRAMUSCULAR | Status: AC
  Administered 2017-10-11: 30 mg via INTRAVENOUS
  Filled 2017-10-11: qty 1

## 2017-10-11 MED ORDER — IOPAMIDOL (ISOVUE-370) INJECTION 76%
75.0000 mL | Freq: Once | INTRAVENOUS | Status: AC | PRN
  Administered 2017-10-11: 75 mL via INTRAVENOUS
  Filled 2017-10-11: qty 75

## 2017-10-11 MED ORDER — KETOROLAC TROMETHAMINE 10 MG PO TABS: 10.0000 mg | ORAL_TABLET | Freq: Three times a day (TID) | ORAL | 0 refills | Status: DC | PRN

## 2017-10-11 MED ORDER — ACETAMINOPHEN 500 MG PO TABS
1000.0000 mg | ORAL_TABLET | Freq: Once | ORAL | Status: AC
  Administered 2017-10-11: 1000 mg via ORAL
  Filled 2017-10-11: qty 2

## 2017-10-11 NOTE — ED Provider Notes (Signed)
[facility] Emergency Department Provider Note  ____________________________________________  Time seen: Approximately 6:08 PM  I have reviewed the triage vital signs and the nursing notes.   HISTORY  Chief Complaint Chest Pain    HPI [name] [name] is a 51 y.o. female with a history of lupus presenting with right-sided chest pain.  Patient reports that she woke up this morning with the pain behind the right breast that radiated to the right scapula and back.  She had associated shortness of breath "like you are trying to breathe into a balloon."  The pain was worse with deep breaths.  He continues have the symptoms, but they have improved since this morning.  She denies any lower extremity swelling or calf pain, cough or cold symptoms, fever, n/v/d.  Past Medical History:  Diagnosis Date  . ANXIETY 06/06/2007  . Asthma   . BACK PAIN 02/22/2009  . Cervical cancer ([place])   . DEPRESSION 06/06/2007  . Diabetes mellitus without complication ([place])    patient denies  . Essential tremor   . Fibromyalgia   . FREQUENCY, URINARY 07/07/2007  . Gastritis   . GERD 06/06/2007  . Headache   . History of hiatal hernia   . History of kidney stones   . HYPOTHYROIDISM 06/06/2007  . INTERSTITIAL CYSTITIS 09/11/2010  . Lupus 2006  . Lupus   . NEPHROLITHIASIS, HX OF 11/05/2010  . Neuropathy   . OSTEOPENIA 10/14/2009  . OVERACTIVE BLADDER 10/14/2009  . PAIN, CHRONIC NEC 06/06/2007  . POLYARTHRITIS 08/19/2007  . 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 WEIGHT GAIN 02/22/2009    Patient Active Problem List   Diagnosis Date Noted  . Status post hardware removal 02/16/2017  . Sicca syndrome ([place]) 06/04/2014  . Abdominal pain, epigastric 09/08/2013  . Type II or unspecified type diabetes mellitus without mention of complication, uncontrolled 03/15/2013  . UTI (urinary tract infection) 02/15/2013  . Chronic pain 03/31/2011  . NEPHROLITHIASIS, HX OF 11/05/2010  . INTERSTITIAL  CYSTITIS 09/11/2010  . OVERACTIVE BLADDER 10/14/2009  . OSTEOPENIA 10/14/2009  . BACK PAIN 02/22/2009  . WEIGHT GAIN 02/22/2009  . 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 POLYARTHRITIS 08/19/2007  . FREQUENCY, URINARY 07/07/2007  . HYPOTHYROIDISM 06/06/2007  . ANXIETY 06/06/2007  . DEPRESSION 06/06/2007  . PAIN, CHRONIC NEC 06/06/2007  . GERD 06/06/2007    Past Surgical History:  Procedure Laterality Date  . APPENDECTOMY    . CERVICAL BIOPSY  W/ LOOP ELECTRODE EXCISION  2005  . JOINT REPLACEMENT    . [place] ARTHROPLASTY  06-2009  . TONSILLECTOMY    . TONSILLECTOMY    . wrist  ligament repair bilateral    . WRIST RECONSTRUCTION      Current Outpatient Rx  . Order #: [id number] Class: Historical Med  . Order #: [id number] Class: Historical Med  . Order #: [id number] Class: Historical Med  . Order #: [id number] Class: Historical Med  . Order #: [id number] Class: Print  . Order #: [id number] Class: Historical Med  . Order #: [id number] Class: Historical Med  . Order #: [id number] Class: Historical Med  . Order #: [id number] Class: Historical Med  . Order #: [id number] Class: Historical Med  . Order #: [id number] Class: Historical Med  . Order #: [id number] Class: Historical Med  . Order #: [id number] Class: Historical Med  . Order #: [id number] Class: Historical Med  . Order #: [id number] Class: Historical Med  . Order #: [id number] Class: Historical Med  . Order #: [id number] Class: Historical Med  . Order #: [id number] Class: Historical Med  . Order #: [id number] Class:  Historical Med  . Order #: [id number] Class: Historical Med  . Order #: [id number] Class: Historical Med  . Order #: [id number] Class: Historical Med  . Order #: [id number] Class: Historical Med  . Order #: [id number] Class: Print  . Order #: [id number] Class: Historical Med  . Order #: [id number] Class: Print  . Order #: [id number] Class: Print  . Order #: [id number] Class: Print  . Order #: [id number] Class: Historical Med  . Order #:  [id number] Class: Historical Med  . Order #: [id number] Class: Historical Med  . Order #: [id number] Class: Historical Med  . Order #: [id number] Class: Historical Med  . Order #: [id number] Class: Historical Med  . Order #: [id number] Class: Print  . Order #: [id number] Class: Normal  . Order #: [id number] Class: Normal  . Order #: [id number] Class: Historical Med  . Order #: [id number] Class: Historical Med  . Order #: [id number] Class: Historical Med  . Order #: [id number] Class: Historical Med  . Order #: [id number] Class: Historical Med  . Order #: [id number] Class: Historical Med  . Order #: [id number] Class: Historical Med  . Order #: [id number] Class: Historical Med  . Order #: [id number] Class: Historical Med  . Order #: [id number] Class: Historical Med    Allergies Latex; Prasterone; Shellfish-derived products; Bee venom; Compazine [prochlorperazine edisylate]; Dhea [nutritional supplements]; Other; Prochlorperazine; Promethazine; Promethazine hcl; Clarithromycin; Fish allergy; Hydromorphone hcl; and Penicillins  Family History  Problem Relation Age of Onset  . Hypertension Mother   . Arthritis Mother   . Asthma Sister   . Asthma Daughter   . Pancreatic cancer Father   . Diabetes Maternal Grandmother   . Heart disease Maternal Grandmother   . Colon cancer Maternal Uncle 61  . Crohn's disease Maternal Aunt   . Diabetes Maternal Aunt   . Breast cancer Cousin 31       maternal    Social History Social History   Tobacco Use  . Smoking status: Never Smoker  . Smokeless tobacco: Never Used  Substance Use Topics  . Alcohol use: No  . Drug use: No    Review of Systems Constitutional: No fever/chills.  No lightheadedness or syncope.  No diaphoresis. Eyes: No visual changes. ENT: No sore throat. No congestion or rhinorrhea. Cardiovascular: Positive pleuritic chest pain. Denies palpitations. Respiratory: Positive shortness of breath.  No cough. Gastrointestinal: No abdominal pain.  No nausea, no  vomiting.  No diarrhea.  No constipation. Genitourinary: Negative for dysuria. Musculoskeletal: Negative for back pain.  No lower extremity swelling or calf pain. Skin: Negative for rash. Neurological: Negative for headaches. No focal numbness, tingling or weakness.     ____________________________________________   PHYSICAL EXAM:  VITAL SIGNS: ED Triage Vitals  Enc Vitals Group     BP 10/11/17 1433 92/75     Pulse Rate 10/11/17 1433 77     Resp 10/11/17 1433 18     Temp 10/11/17 1433 97.7 F (36.5 C)     Temp Source 10/11/17 1433 Oral     SpO2 10/11/17 1433 96 %     Weight 10/11/17 1436 223 lb (101.2 kg)     Height 10/11/17 1436 5\' 10"  (1.778 m)     Head Circumference --      Peak Flow --      Pain Score 10/11/17 1435 8     Pain Loc --      Pain Edu? --      Excl. in [place]? --     Constitutional: Alert and oriented. Answers questions appropriately.  Chronically ill-appearing with a baseline resting tremor but nontoxic. Eyes: Conjunctivae are normal.  EOMI.  No scleral icterus. Head: Atraumatic. Nose: No congestion/rhinnorhea. Mouth/Throat: Mucous membranes are moist.  Neck: No stridor.  Supple.  No JVD.  No meningismus. Cardiovascular: Normal rate, regular rhythm. No murmurs, rubs or gallops.  Respiratory: Normal respiratory effort.  No accessory muscle use or retractions. Lungs CTAB.  No wheezes, rales or ronchi.  I am not able to reproduce the patient's pain by palpating her chest wall. Gastrointestinal: Soft, nontender and nondistended.  No guarding or rebound.  No peritoneal signs. Musculoskeletal: No LE edema. No ttp in the calves or palpable cords.  Negative Homan's sign. Neurologic:  A&Ox3.  Speech is clear.  Face and smile are symmetric.  EOMI.  Moves all extremities well. Skin:  Skin is warm, dry and intact. No rash noted. Psychiatric: Mood and affect are normal. Speech and behavior are normal.  Normal judgement.  ____________________________________________    LABS (all labs ordered are listed, but only abnormal results are displayed)  Labs Reviewed  BASIC METABOLIC PANEL - Abnormal; Notable for the following components:      Result Value   Glucose, Bld 104 (*)    All other components within normal limits  CBC - Abnormal; Notable for the following components:   MCHC 31.2 (*)    RDW 18.2 (*)    Platelets 146 (*)    All other components within normal limits  TROPONIN I  LIPASE, BLOOD  TROPONIN I  PROTIME-INR  APTT   ____________________________________________  EKG  ED ECG REPORT I, Eula Listen, the attending physician, personally viewed and interpreted this ECG.   Date: 10/11/2017  EKG Time: 1432  Rate: 65  Rhythm: normal sinus rhythm  Axis: normal  Intervals:none  ST&T Change: No STEMI  ____________________________________________  RADIOLOGY  Dg Chest 2 View  Result Date: 10/11/2017 CLINICAL DATA:  Chest pain EXAM: CHEST  2 VIEW COMPARISON:  Chest radiograph 07/26/2017 FINDINGS: The heart size and mediastinal contours are within normal limits. Both lungs are clear. The visualized skeletal structures are unremarkable. IMPRESSION: No active cardiopulmonary disease. Electronically Signed   By: Ulyses Jarred M.D.   On: 10/11/2017 15:16   Ct Angio Chest Pe W And/or Wo Contrast  Result Date: 10/11/2017 CLINICAL DATA:  Right side chest pain that started today. Shortness of breath. EXAM: CT ANGIOGRAPHY CHEST WITH CONTRAST TECHNIQUE: Multidetector CT imaging of the chest was performed using the standard protocol during bolus administration of intravenous contrast. Multiplanar CT image reconstructions and MIPs were obtained to evaluate the vascular anatomy. CONTRAST:  22mL ISOVUE-370 IOPAMIDOL (ISOVUE-370) INJECTION 76% COMPARISON:  01/03/2016 FINDINGS: Cardiovascular: Heart size mildly enlarged. No pericardial effusion. No thoracic aortic aneurysm. No filling defects in the opacified pulmonary arteries to suggest the  presence of an acute pulmonary embolus. Mediastinum/Nodes: No mediastinal lymphadenopathy. There is no hilar lymphadenopathy. The esophagus has normal imaging features. There is no axillary lymphadenopathy. Lungs/Pleura: Dependent ground-glass attenuation is symmetric and similar appearance to prior study likely related to atelectasis. No evidence for pulmonary edema. No pleural effusion. Upper Abdomen: Unremarkable. Musculoskeletal: Bone windows reveal no worrisome lytic or sclerotic osseous lesions. Review of the MIP images confirms the above findings. IMPRESSION: 1. No CT evidence for acute pulmonary embolus. 2. Symmetric dependent ground-glass attenuation in the lungs, similar to prior study. Imaging features likely related to hypo expansion/ compressive atelectasis. Electronically Signed   By: Misty Stanley M.D.   On: 10/11/2017 19:56    ____________________________________________   PROCEDURES  Procedure(s) performed: None  Procedures  Critical Care performed: No ____________________________________________  INITIAL IMPRESSION / ASSESSMENT AND PLAN / ED COURSE  Pertinent labs & imaging results that were available during my care of the patient were reviewed by me and considered in my medical decision making (see chart for details).  51 y.o. female with a history of lupus, no prior or family history of blood clots, presenting with right-sided pleuritic chest pain.  Overall, the patient is hemodynamically stable and afebrile.  Her lupus does predispose her to blood clots, so we will get a CT scan to evaluate for PE although the patient's O2 sats and heart rate are reassuring.  I do not see any evidence of right heart strain on her EKG.  It is less likely that the patient's chest pain is due to ACS or MI, given her lack of risk factors, as well as reassuring EKG and negative troponin.  Musculoskeletal pain is also possible.  Plan reevaluation for final  disposition.  ----------------------------------------- 9:09 PM on 10/11/2017 -----------------------------------------  The patient's workup in the emergency department has been reassuring.  She has remained hemodynamically stable, and her pain completely resolved with Toradol.  Her CT angiogram does not show any evidence of PE.  The cause of the patient's symptoms is not totally clear at this time, but there is no evidence for any acute emergency pathology.  At this time, the patient will be discharged home, and I have discussed follow-up and return precautions with her.  ____________________________________________  FINAL CLINICAL IMPRESSION(S) / ED DIAGNOSES  Final diagnoses:  Right-sided chest pain  Shortness of breath         NEW MEDICATIONS STARTED DURING THIS VISIT:  This SmartLink is deprecated. Use AVSMEDLIST instead to display the medication list for a patient.    Eula Listen, MD 10/11/17 2110

## 2017-10-11 NOTE — ED Notes (Signed)
Patient transported to CT 

## 2017-10-11 NOTE — ED Notes (Signed)
ED Provider at bedside. 

## 2017-10-11 NOTE — ED Triage Notes (Signed)
Pt c/o right sided chest pain that started today.  Has had SHOB with it.  Pain is under right breast/ribs and radiates to right shoulder/back.  Unlabored respirations.  Skin color WNL.  VSS. Ambulatory.  Has had gallbladder removed.  Has fentanyl patch to right chest wall for chronic pain.

## 2017-10-11 NOTE — Discharge Instructions (Signed)
You may take Tylenol or Toradol as needed for your chest pain.  If you take Toradol, please take it with food and do not take any other NSAID medications including Advil, Motrin, ibuprofen, or Aleve.  Please return to the emergency department if you develop severe pain, lightheadedness or fainting, shortness of breath, fever, or any other symptoms concerning to you.

## 2017-11-03 ENCOUNTER — Encounter: Payer: Self-pay | Admitting: Internal Medicine

## 2017-11-03 ENCOUNTER — Ambulatory Visit: Payer: Medicare Other | Admitting: Internal Medicine

## 2017-11-03 VITALS — BP 118/68 | HR 83 | Temp 97.9°F | Ht 68.5 in | Wt 229.1 lb

## 2017-11-03 DIAGNOSIS — M329 Systemic lupus erythematosus, unspecified: Secondary | ICD-10-CM

## 2017-11-03 DIAGNOSIS — G894 Chronic pain syndrome: Secondary | ICD-10-CM

## 2017-11-03 DIAGNOSIS — F339 Major depressive disorder, recurrent, unspecified: Secondary | ICD-10-CM | POA: Diagnosis not present

## 2017-11-03 DIAGNOSIS — F411 Generalized anxiety disorder: Secondary | ICD-10-CM | POA: Diagnosis not present

## 2017-11-03 DIAGNOSIS — E039 Hypothyroidism, unspecified: Secondary | ICD-10-CM | POA: Diagnosis not present

## 2017-11-03 DIAGNOSIS — Z113 Encounter for screening for infections with a predominantly sexual mode of transmission: Secondary | ICD-10-CM

## 2017-11-03 DIAGNOSIS — D696 Thrombocytopenia, unspecified: Secondary | ICD-10-CM | POA: Diagnosis not present

## 2017-11-03 DIAGNOSIS — M8580 Other specified disorders of bone density and structure, unspecified site: Secondary | ICD-10-CM | POA: Diagnosis not present

## 2017-11-03 DIAGNOSIS — Z1159 Encounter for screening for other viral diseases: Secondary | ICD-10-CM

## 2017-11-03 DIAGNOSIS — M545 Low back pain: Secondary | ICD-10-CM

## 2017-11-03 DIAGNOSIS — M797 Fibromyalgia: Secondary | ICD-10-CM

## 2017-11-03 DIAGNOSIS — M899 Disorder of bone, unspecified: Secondary | ICD-10-CM | POA: Diagnosis not present

## 2017-11-03 DIAGNOSIS — M949 Disorder of cartilage, unspecified: Secondary | ICD-10-CM

## 2017-11-03 DIAGNOSIS — G8929 Other chronic pain: Secondary | ICD-10-CM

## 2017-11-03 DIAGNOSIS — N318 Other neuromuscular dysfunction of bladder: Secondary | ICD-10-CM

## 2017-11-03 DIAGNOSIS — Z13220 Encounter for screening for lipoid disorders: Secondary | ICD-10-CM | POA: Diagnosis not present

## 2017-11-03 NOTE — Patient Instructions (Addendum)
Please schedule labs Friday fasting x 12 hours  We will refer you to the pain clinic  Follow up in 6-8 weeks sooner if needed   Urinary Incontinence Urinary incontinence is the involuntary loss of urine from your bladder. What are the causes? There are many causes of urinary incontinence. They include:  Medicines.  Infections.  Prostatic enlargement, leading to overflow of urine from your bladder.  Surgery.  Neurological diseases.  Emotional factors.  What are the signs or symptoms? Urinary Incontinence can be divided into four types: 1. Urge incontinence. Urge incontinence is the involuntary loss of urine before you have the opportunity to go to the bathroom. There is a sudden urge to void but not enough time to reach a bathroom. 2. Stress incontinence. Stress incontinence is the sudden loss of urine with any activity that forces urine to pass. It is commonly caused by anatomical changes to the pelvis and sphincter areas of your body. 3. Overflow incontinence. Overflow incontinence is the loss of urine from an obstructed opening to your bladder. This results in a backup of urine and a resultant buildup of pressure within the bladder. When the pressure within the bladder exceeds the closing pressure of the sphincter, the urine overflows, which causes incontinence, similar to water overflowing a dam. 4. Total incontinence. Total incontinence is the loss of urine as a result of the inability to store urine within your bladder.  How is this diagnosed? Evaluating the cause of incontinence may require:  A thorough and complete medical and obstetric history.  A complete physical exam.  Laboratory tests such as a urine culture and sensitivities.  When additional tests are indicated, they can include:  An ultrasound exam.  Kidney and bladder X-rays.  Cystoscopy. This is an exam of the bladder using a narrow scope.  Urodynamic testing to test the nerve function to the bladder and  sphincter areas.  How is this treated? Treatment for urinary incontinence depends on the cause:  For urge incontinence caused by a bacterial infection, antibiotics will be prescribed. If the urge incontinence is related to medicines you take, your health care provider may have you change the medicine.  For stress incontinence, surgery to re-establish anatomical support to the bladder or sphincter, or both, will often correct the condition.  For overflow incontinence caused by an enlarged prostate, an operation to open the channel through the enlarged prostate will allow the flow of urine out of the bladder. In women with fibroids, a hysterectomy may be recommended.  For total incontinence, surgery on your urinary sphincter may help. An artificial urinary sphincter (an inflatable cuff placed around the urethra) may be required. In women who have developed a hole-like passage between their bladder and vagina (vesicovaginal fistula), surgery to close the fistula often is required.  Follow these instructions at home:  Normal daily hygiene and the use of pads or adult diapers that are changed regularly will help prevent odors and skin damage.  Avoid caffeine. It can overstimulate your bladder.  Use the bathroom regularly. Try about every 2-3 hours to go to the bathroom, even if you do not feel the need to do so. Take time to empty your bladder completely. After urinating, wait a minute. Then try to urinate again.  For causes involving nerve dysfunction, keep a log of the medicines you take and a journal of the times you go to the bathroom. Contact a health care provider if:  You experience worsening of pain instead of improvement in pain  after your procedure.  Your incontinence becomes worse instead of better. Get help right away if:  You experience fever or shaking chills.  You are unable to pass your urine.  You have redness spreading into your groin or down into your thighs. This  information is not intended to replace advice given to you by your health care provider. Make sure you discuss any questions you have with your health care provider. Document Released: 12/24/2004 Document Revised: 06/26/2016 Document Reviewed: 04/25/2013 Elsevier Interactive Patient Education  2018 [place].   Vasculitis Vasculitis is swelling (inflammation) of the blood vessels. With vasculitis, the blood vessels can become thick, narrow, scarred, or weak, and enough blood may not be able to flow through them. This can cause damage to the muscles, kidneys, lungs, brain, and other parts of the body. There are many types of vasculitis. Some last only a short time while others last a long time. What are the causes? The exact cause is unknown, but vasculitis can develop when the body's immune system attacks its own blood vessels. This attack can be caused by:  An infection.  An immune system disease, such as lupus, rheumatoid arthritis, or scleroderma.  An allergic reaction to a medicine.  Cancer that affects blood cells, such as leukemia and lymphoma.  What increases the risk?  Being a smoker.  Being under stress.  Having a physical injury. What are the signs or symptoms? Symptoms vary depending on the type of vasculitis you have. Symptoms that are common to all types of vasculitis include:  Fever.  Poor appetite.  Weight loss.  Feeling very tired.  Having aches and pains.  Weakness.  Numbness in an area of your body.  Symptoms for specific types of vasculitis include:  Skin problems, such as sores, spots, or rashes.  Trouble seeing.  Trouble breathing.  Blood in your urine.  Headaches.  Stomach pain.  Stuffy or bloody nose.  How is this diagnosed? Your health care provider will ask about your symptoms and do a physical exam. You may have tests done, such as:  A complete blood count (CBC).  Erythrocyte sedimentation, also called sed rate  test.  C-reactive protein (CRP).  Antineutrophil cytoplasmic antibodies (ANCA).  A urine test.  A biopsy of a blood vessel.  A nerve conduction study.  Imaging tests, such as: ? X-rays. ? A CT scan. ? An ultrasound. ? An MRI. ? Angiography.  How is this treated? Treatment will depend on the type of vasculitis you have and how severe it is. Sometimes treatment is not needed. Treatment often includes:  Medicines.  Physical therapy or occupational therapy. This helps strengthen muscles that were weakened by the disease.  You will need to see your health care provider while you are being treated. During follow-up visits, your health care provider will:  Perform blood tests and bone density tests.  Check your blood pressure and blood sugar.  Check for side effects of any medicines you are taking.  Vasculitis cannot always be cured. Sometimes symptoms go away but the disease does not (the disease goes in remission). If symptoms return, increased treatment may be needed. Follow these instructions at home:  Take medicines only as directed by your health care provider.  Keep all follow-up visits as directed by your health care provider. This is important.  Exercise. Talk with your health care provider about what exercises are okay for you to do. Usually exercises that increase your heart rate (aerobic exercise), such as walking, are  recommended. Aerobic exercise helps control your blood pressure and prevent bone loss.  Follow a healthy diet. Include healthy sources of protein, fruits, vegetables, and whole grains in your diet.  Learn as much as you can about vasculitis, and consider joining a support group. Understanding your condition and talking with others who have it may help you cope. Talk with your health care provider if you feel stressed, anxious, or depressed. Contact a health care provider if:  Your symptoms return, or you have new symptoms.  Your fever, fatigue,  headache, or weight loss gets worse.  You have signs of infection, such as fever, warmth, tenderness, redness, or swelling. Get help right away if:  Your vision gets worse.  Your pain does not go away, even after you take pain medicine.  You have chest or stomach pain.  You have trouble breathing.  One side of your face or body suddenly becomes weak or numb.  Your nose bleeds.  There is blood in your urine. This information is not intended to replace advice given to you by your health care provider. Make sure you discuss any questions you have with your health care provider. Document Released: 09/12/2009 Document Revised: 04/23/2016 Document Reviewed: 01/10/2014 Elsevier Interactive Patient Education  [employer].

## 2017-11-04 ENCOUNTER — Encounter: Payer: Self-pay | Admitting: Internal Medicine

## 2017-11-04 ENCOUNTER — Telehealth: Payer: Self-pay | Admitting: Radiology

## 2017-11-04 DIAGNOSIS — M797 Fibromyalgia: Secondary | ICD-10-CM | POA: Insufficient documentation

## 2017-11-04 NOTE — Progress Notes (Addendum)
Chief Complaint  Patient presents with  . Establish Care   Establish care and complicate medical history  1. She reports h/o overactive bladder previously on Vesicare and helped but stopped b/c she is on a lot of meds.  She reports she does not drink caffeine 2. H/o chronic pain (back, fibromyalgia, other joints (I.e hip, knees) 2/2 lupus arthritis per pt on oral narcotics, Fentanyl patch, topical and oral NSAIDS. Previous PCP Dr. Corena Pilgrim was Rx but will refer to pain clinic  3. H/o vasculitis/panniculitis legs currently not active never seen dermatology  4. Chronic migraines controlled follows with Dr. Manuella Ghazi also found to have benign essential tremor    Review of Systems  Constitutional: Negative for weight loss.  HENT:       H/o sores in mouth none currently   Eyes:       Denies vision problems currently   Respiratory: Negative for shortness of breath.   Cardiovascular: Positive for leg swelling. Negative for chest pain.       Also h/o hand swelling with lupus  Gastrointestinal: Positive for abdominal pain, constipation and nausea.  Genitourinary: Positive for frequency.       Urinary frequency was on Vesicare at one pt stopped it did help   Musculoskeletal: Positive for back pain and joint pain.       +chronic arthritis pain 2/2 lupus  Skin: Negative for rash.  Neurological: Positive for dizziness, tremors, sensory change and headaches.       +weakness   Psychiatric/Behavioral: Positive for depression. The patient is nervous/anxious.        +stress    Past Medical History:  Diagnosis Date  . ANXIETY 06/06/2007  . Asthma   . BACK PAIN 02/22/2009  . Cervical cancer ([place])   . DEPRESSION 06/06/2007  . Diabetes mellitus without complication ([place])    patient denies  . Essential tremor   . Fibromyalgia   . FREQUENCY, URINARY 07/07/2007  . Gastritis   . GERD 06/06/2007  . Headache   . History of hiatal hernia   . History of kidney stones   . HYPOTHYROIDISM 06/06/2007  . INTERSTITIAL  CYSTITIS 09/11/2010  . Lupus 2006  . Lupus   . NEPHROLITHIASIS, HX OF 11/05/2010  . Neuropathy   . OSTEOPENIA 10/14/2009  . OVERACTIVE BLADDER 10/14/2009  . PAIN, CHRONIC NEC 06/06/2007  . POLYARTHRITIS 08/19/2007  . 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 WEIGHT GAIN 02/22/2009   Past Surgical History:  Procedure Laterality Date  . APPENDECTOMY    . CERVICAL BIOPSY  W/ LOOP ELECTRODE EXCISION  2005  . ESOPHAGOGASTRODUODENOSCOPY (EGD) WITH PROPOFOL N/A 08/27/2017   Procedure: ESOPHAGOGASTRODUODENOSCOPY (EGD) WITH PROPOFOL;  Surgeon: Manya Silvas, MD;  Location: [facility] ENDOSCOPY;  Service: Endoscopy;  Laterality: N/A;  . HARDWARE REMOVAL Right 02/16/2017   Procedure: HARDWARE REMOVAL FROM HIP;  Surgeon: Hessie Knows, MD;  Location: [facility] ORS;  Service: Orthopedics;  Laterality: Right;  . JOINT REPLACEMENT    . [place] ARTHROPLASTY  06-2009  . TONSILLECTOMY    . TONSILLECTOMY    . wrist  ligament repair bilateral    . WRIST RECONSTRUCTION     Family History  Problem Relation Age of Onset  . Hypertension Mother   . Arthritis Mother   . Asthma Sister   . Asthma Daughter   . Pancreatic cancer Father   . Diabetes Maternal Grandmother   . Heart disease Maternal Grandmother   . Colon cancer Maternal Uncle 56  . Crohn's disease  Maternal Aunt   . Diabetes Maternal Aunt   . Breast cancer Cousin 29       maternal   Social History   Socioeconomic History  . Marital status: Divorced    Spouse name: Not on file  . Number of children: 1  . Years of education: Not on file  . Highest education level: Not on file  Social Needs  . Financial resource strain: Not on file  . Food insecurity - worry: Not on file  . Food insecurity - inability: Not on file  . Transportation needs - medical: Not on file  . Transportation needs - non-medical: Not on file  Occupational History  . Occupation: DISABLED    Employer: UNEMPLOYED  Tobacco Use  . Smoking status: Never Smoker  .  Smokeless tobacco: Never Used  Substance and Sexual Activity  . Alcohol use: No  . Drug use: No  . Sexual activity: Not Currently    Birth control/protection: None, Post-menopausal  Other Topics Concern  . 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   She lives with daughter (57) and mother (62).         Current Meds  Medication Sig  . albuterol (PROAIR HFA) 108 (90 Base) MCG/ACT inhaler ProAir HFA 90 mcg/actuation aerosol inhaler  . albuterol (VENTOLIN HFA) 108 (90 Base) MCG/ACT inhaler Ventolin HFA 90 mcg/actuation aerosol inhaler  INHALE 2 PUFFS (180 MCG) BY INHALATION ROUTE EVERY 4 HOURS  . ARIPiprazole (ABILIFY) 20 MG tablet Take 20 mg by mouth daily.  . Ascorbic Acid (VITAMIN C) 500 MG CHEW Chew 2,500 mg by mouth daily.  Marland Kitchen aspirin 325 MG tablet Take 1 tablet (325 mg total) by mouth daily.  . Biotin 10 MG CAPS Take by mouth.  . Biotin 5000 MCG SUBL Place 10,000 mcg under the tongue daily.  . budesonide-formoterol (SYMBICORT) 160-4.5 MCG/ACT inhaler Symbicort 160 mcg-4.5 mcg/actuation HFA aerosol inhaler  . calcipotriene (DOVONOX) 0.005 % ointment calcipotriene 0.005 % topical ointment  APPLY A THIN LAYER TO THE AFFECTED AREA(S) BY TOPICAL ROUTE ONCE DAILY ; RUB IN GENTLY AND COMPLETELY  . Cholecalciferol (VITAMIN D3) 2000 units capsule Vitamin D3 2,000 unit capsule  . dapsone 25 MG tablet Take by mouth.  . dexmethylphenidate (FOCALIN XR) 20 MG 24 hr capsule dexmethylphenidate ER 20 mg capsule,extended release biphasic50-50  . diclofenac sodium (VOLTAREN) 1 % GEL Apply 2 g topically 4 (four) times daily as needed (for knee & finger pain.).   Marland Kitchen DULoxetine (CYMBALTA) 60 MG capsule duloxetine 60 mg capsule,delayed release  1 twice daily  . DULoxetine (CYMBALTA) 60 MG capsule Take 60 mg by mouth 2 (two) times daily.  Marland Kitchen EPINEPHrine (EPIPEN 2-PAK) 0.3 mg/0.3 mL IJ SOAJ injection EpiPen 2-Pak 0.3 mg/0.3 mL injection,  auto-injector  . etodolac (LODINE) 500 MG tablet etodolac 500 mg tablet  . etodolac (LODINE) 500 MG tablet Take by mouth.  . fentaNYL (DURAGESIC - DOSED MCG/HR) 75 MCG/HR Place 75 mcg onto the skin every 3 (three) days.  . fluocinonide ointment (LIDEX) 0.05 % fluocinonide 0.05 % topical ointment  APPLY TO THE AFFECTED AREA(S) BY TOPICAL ROUTE 2 TIMES PER DAY  . folic acid (FOLVITE) 1 MG tablet Take 1 mg by mouth daily.  . hydrocortisone (PROCTOZONE-HC) 2.5 % rectal cream Proctozone-HC 2.5 % topical cream perineal applicator  . hydroxychloroquine (PLAQUENIL) 200 MG tablet Plaquenil 200 mg tablet  Take 1 tablet twice a day by  oral route after meals for 90 days.  Marland Kitchen ibuprofen (ADVIL,MOTRIN) 800 MG tablet Take 1 tablet (800 mg total) by mouth every 8 (eight) hours as needed.  . imiquimod (ALDARA) 5 % cream imiquimod 5 % topical cream packet  APPLY TO THE AFFECTED AREA(S) BY TOPICAL ROUTE 3 TIMES PER WEEK at bedtime  . ketorolac (TORADOL) 10 MG tablet Take 1 tablet (10 mg total) every 8 (eight) hours as needed by mouth for moderate pain (with food).  Marland Kitchen levothyroxine (SYNTHROID, LEVOTHROID) 25 MCG tablet Take 1 tablet (25 mcg total) by mouth daily before breakfast.  . lidocaine (LIDODERM) 5 % Place 1 patch onto the skin every 12 (twelve) hours. Remove & Discard patch within 12 hours or as directed by MD  . Lifitegrast 5 % SOLN Apply to eye.  Marland Kitchen LORazepam (ATIVAN) 2 MG tablet Take 1-2 mg by mouth See admin instructions. 1 MG DAILY AS NEEDED FOR ANXIETY & SCHEDULED AT BEDTIME EACH NIGHT  . methylphenidate 18 MG PO CR tablet Take by mouth.  . montelukast (SINGULAIR) 10 MG tablet montelukast 10 mg tablet  TAKE 1 TABLET BY MOUTH DAILY  . Multiple Vitamins-Minerals (MULTI-VITAMIN GUMMIES PO) Multi Vitamin  GUMMIES  . nystatin [facility]) powder Nyamyc 100,000 unit/gram topical powder  . ondansetron (ZOFRAN) 8 MG tablet Zofran 8 mg tablet  Take 1 tablet every 8 hours by oral route as needed.  Marland Kitchen  oxyCODONE-acetaminophen (PERCOCET/ROXICET) 5-325 MG tablet Take 1-2 tablets by mouth every 4 (four) hours as needed for moderate pain.  . pantoprazole (PROTONIX) 40 MG tablet Take one tablet by mouth twice daily (Patient taking differently: TAKE 1 TABLET (40 MG) BY MOUTH ONCE DAILY IN THE MORNING.)  . PARoxetine (PAXIL) 10 MG tablet TAKE 1 TABLET BY MOUTH AT BEDTIME.  . Polyvinyl Alcohol-Povidone (REFRESH OP) Place 1 drop into both eyes 2 (two) times daily.  . Polyvinyl Alcohol-Povidone PF 1.4-0.6 % SOLN Apply to eye.  . predniSONE (DELTASONE) 10 MG tablet prednisone 10 mg tablet  1 daily  . Probiotic Product (PROBIOTIC PO) Probiotic 10 billion cell capsule  daily  . silver sulfADIAZINE (SILVADENE) 1 % cream silver sulfadiazine 1 % topical cream  . Sodium Chloride 3 % AERS Saline Nasal Mist 3 %  spray nose deeply every hour or two and blow nose  . sucralfate (CARAFATE) 1 g tablet sucralfate 1 gram tablet  . topiramate (TOPAMAX) 100 MG tablet topiramate 100 mg tablet  TAKE 1 TABLET BY MOUTH 2 TIMES DAILY.  Marland Kitchen triamcinolone cream (KENALOG) 0.1 % triamcinolone acetonide 0.1 % topical cream  . valACYclovir (VALTREX) 500 MG tablet valacyclovir 500 mg tablet  1 daily   Allergies  Allergen Reactions  . Latex Anaphylaxis and Rash  . Prasterone Other (See Comments) and Nausea And Vomiting    rash Other reaction(s): Headache Headaches.  . Shellfish-Derived Products Hives, Other (See Comments), Rash and Swelling    Facial swelling Uncoded Allergy. Allergen: seafood Uncoded Allergy. Allergen: CATS, Other Reaction: itch, wheezing Uncoded Allergy. Allergen: COMPAZINE, Other Reaction: tremors Facial swelling Facial swelling Uncoded Allergy. Allergen: seafood Uncoded Allergy. Allergen: CATS, Other Reaction: itch, wheezing Uncoded Allergy. Allergen: COMPAZINE, Other Reaction: tremors Facial swelling Uncoded Allergy. Allergen: seafood Uncoded Allergy. Allergen: CATS, Other Reaction: itch,  wheezing Uncoded Allergy. Allergen: COMPAZINE, Other Reaction: tremors Facial swelling  . Bee Venom Itching  . Compazine [Prochlorperazine Edisylate] Other (See Comments)    tremors  . Dhea [Nutritional Supplements] Other (See Comments)    Headaches.  . Other   .  Prochlorperazine     Other reaction(s): Other (See Comments) ticks  . Promethazine     Other reaction(s): Other (See Comments) Ticks  . Promethazine Hcl Other (See Comments)    CNS disorder  . Zoledronic Acid   . Clarithromycin Hives and Rash  . Fish Allergy Hives, Swelling and Rash    Facial swelling   . Hydromorphone Hcl Rash and Hives    "Rash all over"  . Penicillins Rash and Other (See Comments)    Has patient had a PCN reaction causing immediate rash, facial/tongue/throat swelling, SOB or lightheadedness with hypotension:No Has patient had a PCN reaction causing severe rash involving mucus membranes or skin necrosis:No Has patient had a PCN reaction that required hospitalization:No Has patient had a PCN reaction occurring within the last 10 years:No If all of the above answers are "NO", then may proceed with Cephalosporin use.   Recent Results (from the past 2160 hour(s))  Pregnancy, urine POC     Status: None   Collection Time: 08/27/17  2:08 PM  Result Value Ref Range   Preg Test, Ur NEGATIVE NEGATIVE    Comment:        THE SENSITIVITY OF THIS METHODOLOGY IS >24 mIU/mL   Surgical pathology     Status: None   Collection Time: 08/27/17  3:24 PM  Result Value Ref Range   SURGICAL PATHOLOGY      Surgical Pathology CASE: [id number] PATIENT: Meliss RODRIGUEZ-OCASIO Surgical Pathology Report     SPECIMEN SUBMITTED: A. Stomach; cbx  CLINICAL HISTORY: None provided  PRE-OPERATIVE DIAGNOSIS: Melena  POST-OPERATIVE DIAGNOSIS: Gastritis, small hiatal hernia     DIAGNOSIS: A. STOMACH; COLD BIOPSY: - ANTRAL MUCOSA WITH HEALING EROSIVE GASTRITIS. - OXYNTIC MUCOSA WITH MILD CHRONIC GASTRITIS,  NONSPECIFIC. - NEGATIVE FOR H. PYLORI, DYSPLASIA, AND MALIGNANCY.   GROSS DESCRIPTION:  A. Labeled: stomach C BX  Tissue fragment(s): 5  Size: 0.3 -0.4 cm  Description: Tan fragments  Entirely submitted in 1 cassette(s).    Final Diagnosis performed by Quay Burow, MD.  Electronically signed 08/31/2017 9:21:05AM    The electronic signature indicates that the named Attending Pathologist has evaluated the specimen  Technical component performed at [facility], [address] [place], [place] [address] Lab: [phone] Dir: Darrick Penna. Evette Doffing, MD  Profes sional component performed at [facility], [facility], [place], [place],  [address] Lab: [phone] Dir: Dellia Nims. Rubinas, MD    Basic metabolic panel     Status: Abnormal   Collection Time: 10/11/17  2:37 PM  Result Value Ref Range   Sodium 141 135 - 145 mmol/L   Potassium 4.0 3.5 - 5.1 mmol/L   Chloride 108 101 - 111 mmol/L   CO2 23 22 - 32 mmol/L   Glucose, Bld 104 (H) 65 - 99 mg/dL   BUN 16 6 - 20 mg/dL   Creatinine, Ser 0.87 0.44 - 1.00 mg/dL   Calcium 9.2 8.9 - 10.3 mg/dL   GFR calc non Af Amer >60 >60 mL/min   GFR calc Af Amer >60 >60 mL/min    Comment: (NOTE) The eGFR has been calculated using the CKD EPI equation. This calculation has not been validated in all clinical situations. eGFR's persistently <60 mL/min signify possible Chronic Kidney Disease.    Anion gap 10 5 - 15  CBC     Status: Abnormal   Collection Time: 10/11/17  2:37 PM  Result Value Ref Range   WBC 6.2 3.6 - 11.0 K/uL   RBC 4.61  3.80 - 5.20 MIL/uL   Hemoglobin 12.5 12.0 - 16.0 g/dL   HCT 40.0 35.0 - 47.0 %   MCV 86.8 80.0 - 100.0 fL   MCH 27.0 26.0 - 34.0 pg   MCHC 31.2 (L) 32.0 - 36.0 g/dL   RDW 18.2 (H) 11.5 - 14.5 %   Platelets 146 (L) 150 - 440 K/uL  Troponin I     Status: None   Collection Time: 10/11/17  2:37 PM  Result Value Ref Range   Troponin I <0.03 <0.03 ng/mL  Lipase, blood      Status: None   Collection Time: 10/11/17  2:37 PM  Result Value Ref Range   Lipase 31 11 - 51 U/L  Troponin I     Status: None   Collection Time: 10/11/17  6:35 PM  Result Value Ref Range   Troponin I <0.03 <0.03 ng/mL  Protime-INR     Status: None   Collection Time: 10/11/17  6:35 PM  Result Value Ref Range   Prothrombin Time 12.9 11.4 - 15.2 seconds   INR 0.98   APTT     Status: None   Collection Time: 10/11/17  6:35 PM  Result Value Ref Range   aPTT 29 24 - 36 seconds   Objective  Body mass index is 34.33 kg/m. Wt Readings from Last 3 Encounters:  11/03/17 229 lb 2 oz (103.9 kg)  10/11/17 223 lb (101.2 kg)  08/04/17 224 lb (101.6 kg)   Temp Readings from Last 3 Encounters:  11/03/17 97.9 F (36.6 C) (Oral)  10/11/17 97.7 F (36.5 C) (Oral)  08/27/17 (!) 97.1 F (36.2 C) (Tympanic)   BP Readings from Last 3 Encounters:  11/03/17 118/68  10/11/17 (!) 123/58  08/27/17 106/78   Pulse Readings from Last 3 Encounters:  11/03/17 83  10/11/17 74  08/27/17 83   Pulse oximetry on room air is 94% Physical Exam  Constitutional: She is oriented to person, place, and time and well-developed, well-nourished, and in no distress. Vital signs are normal.  HENT:  Head: Normocephalic and atraumatic.  Mouth/Throat: Oropharynx is clear and moist and mucous membranes are normal.  Eyes: Conjunctivae are normal. Pupils are equal, round, and reactive to light.  Cardiovascular: Normal rate, regular rhythm and normal heart sounds.  Pulmonary/Chest: Effort normal and breath sounds normal.  Abdominal: Soft. Bowel sounds are normal. There is generalized tenderness.  Neurological: She is alert and oriented to person, place, and time.  Shaking on exam per Dr. Manuella Ghazi benign essential tremor BL walks with cane   Skin: Skin is warm and dry.  Psychiatric: Memory, affect and judgment normal. Her mood appears anxious.  Nursing note and vitals reviewed.  Assessment   1. Lupus with systemic  involvement active per pt including arthritis  -reviewed autoimmune w/u 20111/2012 ANA neg, antids DNA nl, anti smith, RNP, Ro, La all negative, anti CCP negative, RF nl. Nl complement levels C3/C4 09/24/10  2. Chronic pain (hands, hips, back, knees, lupus induced) and fibromyalgia  3. Chronic migraines 4. Chronic abdominal pain  5. Mild Mitral regurgitation  6. Psychiatric do (anxiety, depression PHQ 9 13 today, ADHD) 7. Overactive bladder  8. HM  Plan  1. -off cellcept, chronic steroids  Cont f/u rheumatology Dr. Sarina Ill get records  Cont meds. So far no renal involvement  2.  Refer to pain clinic will no longer refill these meds  Pt reports patches would get from prior PCP q3 months, Percocet q6 months and only use for  flares   3. F/u Dr. Manuella Ghazi Neurology  Cont meds  4. Follows [facility]  5.   Follows with [facility] cardiology Dr. Saralyn Pilar 6. Cont f/u Psych Dr. Denice Paradise Hues who Rx all psych meds 7. Monitor if worsening consider adding Vesicare disc bladder sch and kegels today   8. HM:  -flu shot had 08/30/17  -Tdap had 10/19/2016  Per pt but epic indicates 08/2014  -UTD prevnar, pna 23 last had 09/05/12  -will disc shingrix at f/u   Last pap 2016 LMP 2017; Follows with Dr. Marcelline Mates [facility] will get records. H/o cervical ca s/p LEEP 2005 but nl paps since   Colonoscopy 1/18//2016 with ext hemorrhoids/int hemorrhoids hyperplastic polyp  CT chest 12/11/16 with ground glass lungs will need to repeat CT chest in the future   Mammogram 06/11/17 negative   Dexa 06/29/17 osteopenia previous DEXA +osteoporosis per pt unable to tolerate reclast infusion 05/2017   Check fasting labs this week CBC repeat low plts were 146, had CMET nl 07/26/17 and 09/2017, UA, TSH, T4 lipid, vit D, hep C. Per pt had hiv screening and hep B screening in the past.   Get records  -Dr. Zetta Bills eye MD  -Obtained and reviewed Dr. Danise Mina old PCP since 2016:  Other PMH: TIA, iatrogenic adrenal  insuff. (cortisol 3.8 02/19/16), peptic ulcer, undifferentiated connective tissue d/o (was on MTX, Azathioprine, chronic prednisone, Cellcept,Imuran), Raynauds, mononeuritis multiplex left leg dx'ed Dr. Polly Cobia Neurologist [facility] 2006, IBS, trochanteric bursitis (right hip), chronic steroid use, h/o wart/VV tx'ed with Aldara, BPPV, h/o osteporosis was on Forteo x 2 years in the past h/o osteopenia, h/o tick bite, h/o herpes, lactose intolerance, PCOS, h/o Pleuritis, labs 03/19/15 lyme disease AB (IGG) blot 41 KD (IGG) BAND reactive/abnromal   Previous [employer] paperwork completed by Dr. Corena Pilgrim   HM:  hep vaccine 2005 colonoscpy 2015 EGD had 08/27/17 Dr. Tiffany Kocher +erosive gastritis neg H pylori and malignancy; small hiatal hernia  DEXA 06/29/17 osteopenia + 25 vitamin D 37 10/19/16 DEXA 04/03/15 osteopenia +  Lipid 10/19/16 TC 185, HDL 66, TG 87, LDL 101 A1C 02/17/16 5.4   Vaccines: Tdap 10/19/16, prevnar 06/05/14, pna 23 11/30/04   -Dr. Sarina Ill rheumatology [facility] Dr. Marcelline Mates had previously seen Dr. Fredonia Highland   Pharmacy [place]  Provider: Dr. Olivia Mackie McLean-Scocuzza

## 2017-11-04 NOTE — Telephone Encounter (Signed)
Pt is coming in for labs tomorrow, please place future orders. Thank you  

## 2017-11-05 ENCOUNTER — Other Ambulatory Visit (INDEPENDENT_AMBULATORY_CARE_PROVIDER_SITE_OTHER): Payer: Medicare Other

## 2017-11-05 DIAGNOSIS — M8580 Other specified disorders of bone density and structure, unspecified site: Secondary | ICD-10-CM

## 2017-11-05 DIAGNOSIS — D696 Thrombocytopenia, unspecified: Secondary | ICD-10-CM | POA: Diagnosis not present

## 2017-11-05 DIAGNOSIS — Z113 Encounter for screening for infections with a predominantly sexual mode of transmission: Secondary | ICD-10-CM | POA: Diagnosis not present

## 2017-11-05 DIAGNOSIS — M329 Systemic lupus erythematosus, unspecified: Secondary | ICD-10-CM

## 2017-11-05 DIAGNOSIS — Z13220 Encounter for screening for lipoid disorders: Secondary | ICD-10-CM | POA: Diagnosis not present

## 2017-11-05 DIAGNOSIS — Z1159 Encounter for screening for other viral diseases: Secondary | ICD-10-CM

## 2017-11-05 DIAGNOSIS — E039 Hypothyroidism, unspecified: Secondary | ICD-10-CM | POA: Diagnosis not present

## 2017-11-05 LAB — URINALYSIS, ROUTINE W REFLEX MICROSCOPIC
HGB URINE DIPSTICK: NEGATIVE
LEUKOCYTES UA: NEGATIVE
Nitrite: NEGATIVE
RBC / HPF: NONE SEEN (ref 0–?)
Specific Gravity, Urine: >=1.03 — AB (ref 1.000–1.030)
UROBILINOGEN UA: 0.2 (ref 0.0–1.0)
Urine Glucose: NEGATIVE
pH: 6 (ref 5.0–8.0)

## 2017-11-05 LAB — CBC
HCT: 37.8 % (ref 36.0–46.0)
HEMOGLOBIN: 11.9 g/dL — AB (ref 12.0–15.0)
MCHC: 31.5 g/dL (ref 30.0–36.0)
MCV: 89.7 fl (ref 78.0–100.0)
PLATELETS: 163 10*3/uL (ref 150.0–400.0)
RBC: 4.21 Mil/uL (ref 3.87–5.11)
RDW: 17.5 % — ABNORMAL HIGH (ref 11.5–15.5)
WBC: 6 10*3/uL (ref 4.0–10.5)

## 2017-11-05 LAB — LIPID PANEL
CHOL/HDL RATIO: 2
Cholesterol: 153 mg/dL (ref 0–200)
HDL: 72.3 mg/dL (ref >39.00)
LDL CALC: 63 mg/dL (ref 0–99)
NONHDL: 80.93
TRIGLYCERIDES: 88 mg/dL (ref 0.0–149.0)
VLDL: 17.6 mg/dL (ref 0.0–40.0)

## 2017-11-05 LAB — VITAMIN D 25 HYDROXY (VIT D DEFICIENCY, FRACTURES): VITD: 43.35 ng/mL (ref 30.00–100.00)

## 2017-11-05 LAB — TSH: TSH: 3.92 u[IU]/mL (ref 0.35–4.50)

## 2017-11-05 LAB — T4, FREE: FREE T4: 0.72 ng/dL (ref 0.60–1.60)

## 2017-11-06 LAB — HEPATITIS C ANTIBODY
HEP C AB: NONREACTIVE
SIGNAL TO CUT-OFF: 0.01 (ref <1.00)

## 2017-11-10 ENCOUNTER — Other Ambulatory Visit: Payer: Self-pay | Admitting: Internal Medicine

## 2017-11-10 ENCOUNTER — Encounter: Payer: Self-pay | Admitting: Internal Medicine

## 2017-11-10 NOTE — Telephone Encounter (Signed)
Copied from [place] [phone]. Topic: Quick Communication - Rx Refill/Question >> Nov 10, 2017  9:35 AM Yvette Rack wrote: Has the patient contacted their pharmacy? Yes.     (Agent: If no, request that the patient contact the pharmacy for the refill.)  Synthroid 25mg  once a day Dr Corena Pilgrim from [facility] has retired he was writing this RX for patient   Preferred Pharmacy (with phone number or street name): [id number] - [place], [place] [address] [phone] (Phone)    Agent: Please be advised that RX refills may take up to 3 business days. We ask that you follow-up with your pharmacy.

## 2017-11-11 ENCOUNTER — Other Ambulatory Visit: Payer: Self-pay | Admitting: Internal Medicine

## 2017-11-11 DIAGNOSIS — E039 Hypothyroidism, unspecified: Secondary | ICD-10-CM

## 2017-11-11 MED ORDER — LEVOTHYROXINE SODIUM 25 MCG PO TABS: 25.0000 ug | ORAL_TABLET | Freq: Every day | ORAL | 1 refills | Status: DC

## 2017-11-11 NOTE — Telephone Encounter (Signed)
Last office visit 11/03/17 Next office visit 12/13/17

## 2017-11-11 NOTE — Telephone Encounter (Signed)
Pt asking for a refill of Synthroid, which was previously prescribed by Dr. Corena Pilgrim from [facility]. Can this medication be prescribed by Dr. Tonita [place], who he is currently seeing

## 2017-11-11 NOTE — Telephone Encounter (Signed)
Please advise 

## 2017-11-24 ENCOUNTER — Encounter: Payer: Self-pay | Admitting: Internal Medicine

## 2017-11-26 DIAGNOSIS — I7300 Raynaud's syndrome without gangrene: Secondary | ICD-10-CM | POA: Diagnosis not present

## 2017-11-26 DIAGNOSIS — R0602 Shortness of breath: Secondary | ICD-10-CM | POA: Diagnosis not present

## 2017-11-26 DIAGNOSIS — R079 Chest pain, unspecified: Secondary | ICD-10-CM | POA: Diagnosis not present

## 2017-11-26 DIAGNOSIS — J45909 Unspecified asthma, uncomplicated: Secondary | ICD-10-CM | POA: Diagnosis not present

## 2017-11-26 DIAGNOSIS — G459 Transient cerebral ischemic attack, unspecified: Secondary | ICD-10-CM | POA: Diagnosis not present

## 2017-11-26 DIAGNOSIS — M329 Systemic lupus erythematosus, unspecified: Secondary | ICD-10-CM | POA: Diagnosis not present

## 2017-11-26 DIAGNOSIS — M797 Fibromyalgia: Secondary | ICD-10-CM | POA: Diagnosis not present

## 2017-12-06 DIAGNOSIS — R1013 Epigastric pain: Secondary | ICD-10-CM | POA: Diagnosis not present

## 2017-12-06 DIAGNOSIS — R1012 Left upper quadrant pain: Secondary | ICD-10-CM | POA: Diagnosis not present

## 2017-12-06 DIAGNOSIS — R300 Dysuria: Secondary | ICD-10-CM | POA: Diagnosis not present

## 2017-12-07 ENCOUNTER — Other Ambulatory Visit: Payer: Self-pay | Admitting: Student

## 2017-12-07 DIAGNOSIS — R1013 Epigastric pain: Secondary | ICD-10-CM

## 2017-12-07 DIAGNOSIS — R1012 Left upper quadrant pain: Secondary | ICD-10-CM

## 2017-12-07 DIAGNOSIS — R110 Nausea: Secondary | ICD-10-CM

## 2017-12-09 DIAGNOSIS — F3181 Bipolar II disorder: Secondary | ICD-10-CM | POA: Diagnosis not present

## 2017-12-13 ENCOUNTER — Telehealth: Payer: Self-pay | Admitting: Internal Medicine

## 2017-12-13 ENCOUNTER — Encounter: Payer: Self-pay | Admitting: Internal Medicine

## 2017-12-13 ENCOUNTER — Ambulatory Visit
Admission: RE | Admit: 2017-12-13 | Discharge: 2017-12-13 | Disposition: A | Discharge status: Home / Self Care | Payer: Medicare Other | Source: Ambulatory Visit | Attending: Internal Medicine | Admitting: Internal Medicine

## 2017-12-13 ENCOUNTER — Ambulatory Visit: Payer: Medicare Other | Admitting: Internal Medicine

## 2017-12-13 VITALS — BP 130/70 | HR 90 | Temp 98.3°F | Resp 14 | Ht 68.5 in | Wt 232.0 lb

## 2017-12-13 DIAGNOSIS — J309 Allergic rhinitis, unspecified: Secondary | ICD-10-CM | POA: Diagnosis not present

## 2017-12-13 DIAGNOSIS — K5900 Constipation, unspecified: Secondary | ICD-10-CM

## 2017-12-13 DIAGNOSIS — R51 Headache: Secondary | ICD-10-CM | POA: Diagnosis not present

## 2017-12-13 DIAGNOSIS — R42 Dizziness and giddiness: Secondary | ICD-10-CM | POA: Diagnosis not present

## 2017-12-13 DIAGNOSIS — N3281 Overactive bladder: Secondary | ICD-10-CM

## 2017-12-13 DIAGNOSIS — R1084 Generalized abdominal pain: Secondary | ICD-10-CM

## 2017-12-13 DIAGNOSIS — I951 Orthostatic hypotension: Secondary | ICD-10-CM

## 2017-12-13 DIAGNOSIS — R519 Headache, unspecified: Secondary | ICD-10-CM

## 2017-12-13 MED ORDER — KETOROLAC TROMETHAMINE 60 MG/2ML IM SOLN
60.0000 mg | Freq: Once | INTRAMUSCULAR | Status: AC
  Administered 2017-12-13: 60 mg via INTRAMUSCULAR

## 2017-12-13 MED ORDER — MONTELUKAST SODIUM 10 MG PO TABS: ORAL_TABLET | ORAL | 3 refills | Status: DC

## 2017-12-13 MED ORDER — SOLIFENACIN SUCCINATE 5 MG PO TABS: 5.0000 mg | ORAL_TABLET | Freq: Every day | ORAL | 0 refills | Status: DC

## 2017-12-13 NOTE — Patient Instructions (Addendum)
Please follow up with Dr. Manuella Ghazi if h/a continues call immediately  We gave you Tordadol 60 mg today  Please continue fluid intake  We will need to repeat CT chest in 1 year 09/2018 to follow up ground glass appearance  Follow up in 1 month with me sooner if needed  Think about shingrix vaccine for shingles prevention check with your local pharmacy    Orthostatic Hypotension Orthostatic hypotension is a sudden drop in blood pressure that happens when you quickly change positions, such as when you get up from a seated or lying position. Blood pressure is a measurement of how strongly, or weakly, your blood is pressing against the walls of your arteries. Arteries are blood vessels that carry blood from your heart throughout your body. When blood pressure is too low, you may not get enough blood to your brain or to the rest of your organs. This can cause weakness, light-headedness, rapid heartbeat, and fainting. This can last for just a few seconds or for up to a few minutes. Orthostatic hypotension is usually not a serious problem. However, if it happens frequently or gets worse, it may be a sign of something more serious. What are the causes? This condition may be caused by:  Sudden changes in posture, such as standing up quickly after you have been sitting or lying down.  Blood loss.  Loss of body fluids (dehydration).  Heart problems.  Hormone (endocrine) problems.  Pregnancy.  Severe infection.  Lack of certain nutrients.  Severe allergic reactions (anaphylaxis).  Certain medicines, such as blood pressure medicine or medicines that make the body lose excess fluids (diuretics). Sometimes, this condition can be caused by not taking medicine as directed, such as taking too much of a certain medicine.  What increases the risk? Certain factors can make you more likely to develop orthostatic hypotension, including:  Age. Risk increases as you get older.  Conditions that affect the  heart or the central nervous system.  Taking certain medicines, such as blood pressure medicine or diuretics.  Being pregnant.  What are the signs or symptoms? Symptoms of this condition may include:  Weakness.  Light-headedness.  Dizziness.  Blurred vision.  Fatigue.  Rapid heartbeat.  Fainting, in severe cases.  How is this diagnosed? This condition is diagnosed based on:  Your medical history.  Your symptoms.  Your blood pressure measurement. Your health care provider will check your blood pressure when you are: ? Lying down. ? Sitting. ? Standing.  A blood pressure reading is recorded as two numbers, such as "120 over 80" (or 120/80). The first ("top") number is called the systolic pressure. It is a measure of the pressure in your arteries as your heart beats. The second ("bottom") number is called the diastolic pressure. It is a measure of the pressure in your arteries when your heart relaxes between beats. Blood pressure is measured in a unit called mm Hg. Healthy blood pressure for adults is 120/80. If your blood pressure is below 90/60, you may be diagnosed with hypotension. Other information or tests that may be used to diagnose orthostatic hypotension include:  Your other vital signs, such as your heart rate and temperature.  Blood tests.  Tilt table test. For this test, you will be safely secured to a table that moves you from a lying position to an upright position. Your heart rhythm and blood pressure will be monitored during the test.  How is this treated? Treatment for this condition may include:  Changing  your diet. This may involve eating more salt (sodium) or drinking more water.  Taking medicines to raise your blood pressure.  Changing the dosage of certain medicines you are taking that might be lowering your blood pressure.  Wearing compression stockings. These stockings help to prevent blood clots and reduce swelling in your legs.  In some  cases, you may need to go to the hospital for:  Fluid replacement. This means you will receive fluids through an IV tube.  Blood replacement. This means you will receive donated blood through an IV tube (transfusion).  Treating an infection or heart problems, if this applies.  Monitoring. You may need to be monitored while medicines that you are taking wear off.  Follow these instructions at home: Eating and drinking   Drink enough fluid to keep your urine clear or pale yellow.  Eat a healthy diet and follow instructions from your health care provider about eating or drinking restrictions. A healthy diet includes: ? Fresh fruits and vegetables. ? Whole grains. ? Lean meats. ? Low-fat dairy products.  Eat extra salt only as directed. Do not add extra salt to your diet unless your health care provider told you to do that.  Eat frequent, small meals.  Avoid standing up suddenly after eating. Medicines  Take over-the-counter and prescription medicines only as told by your health care provider. ? Follow instructions from your health care provider about changing the dosage of your current medicines, if this applies. ? Do not stop or adjust any of your medicines on your own. General instructions  Wear compression stockings as told by your health care provider.  Get up slowly from lying down or sitting positions. This gives your blood pressure a chance to adjust.  Avoid hot showers and excessive heat as directed by your health care provider.  Return to your normal activities as told by your health care provider. Ask your health care provider what activities are safe for you.  Do not use any products that contain nicotine or tobacco, such as cigarettes and e-cigarettes. If you need help quitting, ask your health care provider.  Keep all follow-up visits as told by your health care provider. This is important. Contact a health care provider if:  You vomit.  You have  diarrhea.  You have a fever for more than 2-3 days.  You feel more thirsty than usual.  You feel weak and tired. Get help right away if:  You have chest pain.  You have a fast or irregular heartbeat.  You develop numbness in any part of your body.  You cannot move your arms or your legs.  You have trouble speaking.  You become sweaty or feel lightheaded.  You faint.  You feel short of breath.  You have trouble staying awake.  You feel confused. This information is not intended to replace advice given to you by your health care provider. Make sure you discuss any questions you have with your health care provider. Document Released: 11/06/2002 Document Revised: 08/04/2016 Document Reviewed: 05/08/2016 Elsevier Interactive Patient Education  2018 [employer].  Migraine Headache A migraine headache is a very strong throbbing pain on one side or both sides of your head. Migraines can also cause other symptoms. Talk with your doctor about what things may bring on (trigger) your migraine headaches. Follow these instructions at home: Medicines  Take over-the-counter and prescription medicines only as told by your doctor.  Do not drive or use heavy machinery while taking prescription pain medicine.  To prevent or treat constipation while you are taking prescription pain medicine, your doctor may recommend that you: ? Drink enough fluid to keep your pee (urine) clear or pale yellow. ? Take over-the-counter or prescription medicines. ? Eat foods that are high in fiber. These include fresh fruits and vegetables, whole grains, and beans. ? Limit foods that are high in fat and processed sugars. These include fried and sweet foods. Lifestyle  Avoid alcohol.  Do not use any products that contain nicotine or tobacco, such as cigarettes and e-cigarettes. If you need help quitting, ask your doctor.  Get at least 8 hours of sleep every night.  Limit your stress. General  instructions   Keep a journal to find out what may bring on your migraines. For example, write down: ? What you eat and drink. ? How much sleep you get. ? Any change in what you eat or drink. ? Any change in your medicines.  If you have a migraine: ? Avoid things that make your symptoms worse, such as bright lights. ? It may help to lie down in a dark, quiet room. ? Do not drive or use heavy machinery. ? Ask your doctor what activities are safe for you.  Keep all follow-up visits as told by your doctor. This is important. Contact a doctor if:  You get a migraine that is different or worse than your usual migraines. Get help right away if:  Your migraine gets very bad.  You have a fever.  You have a stiff neck.  You have trouble seeing.  Your muscles feel weak or like you cannot control them.  You start to lose your balance a lot.  You start to have trouble walking.  You pass out (faint). This information is not intended to replace advice given to you by your health care provider. Make sure you discuss any questions you have with your health care provider. Document Released: 08/25/2008 Document Revised: 06/05/2016 Document Reviewed: 05/04/2016 Elsevier Interactive Patient Education  2018 [employer].

## 2017-12-13 NOTE — Telephone Encounter (Signed)
[facility] called with report on MRI. Impression as follows.      IMPRESSION: No change. Normal examination. No cause of the presenting symptoms is identified.

## 2017-12-16 ENCOUNTER — Ambulatory Visit
Admission: RE | Admit: 2017-12-16 | Discharge: 2017-12-16 | Disposition: A | Discharge status: Home / Self Care | Payer: Medicare Other | Source: Ambulatory Visit | Attending: Student | Admitting: Student

## 2017-12-16 ENCOUNTER — Encounter: Payer: Self-pay | Admitting: Internal Medicine

## 2017-12-16 DIAGNOSIS — R110 Nausea: Secondary | ICD-10-CM | POA: Insufficient documentation

## 2017-12-16 DIAGNOSIS — R1012 Left upper quadrant pain: Secondary | ICD-10-CM

## 2017-12-16 DIAGNOSIS — K5900 Constipation, unspecified: Secondary | ICD-10-CM | POA: Insufficient documentation

## 2017-12-16 DIAGNOSIS — J309 Allergic rhinitis, unspecified: Secondary | ICD-10-CM | POA: Insufficient documentation

## 2017-12-16 DIAGNOSIS — R1013 Epigastric pain: Secondary | ICD-10-CM | POA: Diagnosis not present

## 2017-12-16 DIAGNOSIS — R42 Dizziness and giddiness: Secondary | ICD-10-CM | POA: Insufficient documentation

## 2017-12-16 DIAGNOSIS — K760 Fatty (change of) liver, not elsewhere classified: Secondary | ICD-10-CM | POA: Diagnosis not present

## 2017-12-16 DIAGNOSIS — I951 Orthostatic hypotension: Secondary | ICD-10-CM | POA: Insufficient documentation

## 2017-12-16 DIAGNOSIS — K5909 Other constipation: Secondary | ICD-10-CM | POA: Insufficient documentation

## 2017-12-16 DIAGNOSIS — R1084 Generalized abdominal pain: Secondary | ICD-10-CM | POA: Insufficient documentation

## 2017-12-16 HISTORY — DX: Systemic involvement of connective tissue, unspecified: M35.9

## 2017-12-16 MED ORDER — IOPAMIDOL (ISOVUE-370) INJECTION 76%
100.0000 mL | Freq: Once | INTRAVENOUS | Status: AC | PRN
  Administered 2017-12-16: 100 mL via INTRAVENOUS

## 2017-12-16 NOTE — Progress Notes (Signed)
Chief Complaint  Patient presents with  . Follow-up   F/u with mom  1. Pt reports left lip tingling and frontal h/a was 10/10 now 7/10 just started at appt. No h/o seizure. She fells dizzy today orthostatics checked and lying 130/70, sitting 118/66 standing 110/66.  We checked cbg and was 99 today.She does have h/o migraines on topamax and follows with neurology. She normally gets aura of smell with h/as but no h/o lip tingling and usu. H/a in crown of head not frontal 2. H/o constipation and chronic ab pain. Follows with [facility] pending Abdominal angiogram Thursday. Ab pain is increasing in freq with food esp and she is constipated. She does miralax to help  3. She reports increased thirst though she is drinking water and dry mouth using biotene   Review of Systems  Constitutional: Negative for weight loss.  HENT: Negative for hearing loss.   Eyes:       No vision problems    Respiratory: Negative for shortness of breath.   Cardiovascular: Negative for chest pain and leg swelling.  Gastrointestinal: Positive for abdominal pain and constipation.  Musculoskeletal: Positive for back pain and joint pain.  Skin: Negative for rash.  Neurological: Positive for dizziness, sensory change and headaches.  Psychiatric/Behavioral: The patient is nervous/anxious.    Past Medical History:  Diagnosis Date  . ADHD   . ANXIETY 06/06/2007  . Arthritis    related to lupus  . Asthma   . BACK PAIN 02/22/2009  . Cervical cancer ([place])   . Cervical cancer ([place])    LEEP 2005   . Chicken pox   . DEPRESSION 06/06/2007  . Diabetes mellitus without complication ([place])    patient denies  . Essential tremor   . Fibromyalgia   . Fibromyalgia   . FREQUENCY, URINARY 07/07/2007  . Gastritis   . GERD 06/06/2007  . Glaucoma   . Headache    h/o migraines   . History of hiatal hernia   . History of kidney stones   . HYPOTHYROIDISM 06/06/2007  . INTERSTITIAL CYSTITIS 09/11/2010  . Lupus 2006  . Lupus   . Menopause    . Migraine   . Mitral valve regurgitation   . NEPHROLITHIASIS, HX OF 11/05/2010  . Neuropathy   . Neuropathy   . OCD (obsessive compulsive disorder)   . Optic neuritis    2005  . OSTEOPENIA 10/14/2009  . Osteoporosis   . OVERACTIVE BLADDER 10/14/2009  . PAIN, CHRONIC NEC 06/06/2007  . Panniculitis   . POLYARTHRITIS 08/19/2007  . Raynaud's disease   . Sicca syndrome ([place])   . 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 Vaginal prolapse    Dr. Marcelline Mates [facility]   . Vasculitis ([place])   . WEIGHT GAIN 02/22/2009   Past Surgical History:  Procedure Laterality Date  . APPENDECTOMY    . CERVICAL BIOPSY  W/ LOOP ELECTRODE EXCISION  2005  . CHOLECYSTECTOMY    . ESOPHAGOGASTRODUODENOSCOPY (EGD) WITH PROPOFOL N/A 08/27/2017   Procedure: ESOPHAGOGASTRODUODENOSCOPY (EGD) WITH PROPOFOL;  Surgeon: Manya Silvas, MD;  Location: [facility] ENDOSCOPY;  Service: Endoscopy;  Laterality: N/A;  . HAND SURGERY     repair of left metatarsal   . HARDWARE REMOVAL Right 02/16/2017   Procedure: HARDWARE REMOVAL FROM HIP;  Surgeon: Hessie Knows, MD;  Location: [facility] ORS;  Service: Orthopedics;  Laterality: Right;  . JOINT REPLACEMENT    . OTHER SURGICAL HISTORY     left 3rd metatarsal fracture repair 2011   .  REVISION TOTAL HIP ARTHROPLASTY  06/2009   replacement 2010 then screw and plate removal 0254; right hip  . TONSILLECTOMY    . TONSILLECTOMY    . wrist  ligament repair bilateral    . WRIST RECONSTRUCTION    . WRIST SURGERY     laceration of right wrist ligaments  . wrist surgery     laceration of left wrist surgery    Family History  Problem Relation Age of Onset  . Hypertension Mother   . Arthritis Mother   . Heart disease Mother        afib  . Asthma Sister   . Asthma Daughter   . Arthritis Daughter   . Heart disease Daughter        ?heart condition on BB  . ADD / ADHD Daughter   . Pancreatic cancer Father   . Cancer Father        pancreatitic   . Diabetes Maternal Grandmother   . Heart  disease Maternal Grandmother   . Arthritis Maternal Grandmother   . Hypertension Maternal Grandmother   . Colon cancer Maternal Uncle 25  . Crohn's disease Maternal Aunt   . Diabetes Maternal Aunt   . Breast cancer Cousin 50       maternal  . Cancer Cousin        m cousin breat cancer s/p removal both breasts    Social History   Socioeconomic History  . Marital status: Divorced    Spouse name: Not on file  . Number of children: 1  . Years of education: Not on file  . Highest education level: Not on file  Social Needs  . Financial resource strain: Not on file  . Food insecurity - worry: Not on file  . Food insecurity - inability: Not on file  . Transportation needs - medical: Not on file  . Transportation needs - non-medical: Not on file  Occupational History  . Occupation: DISABLED    Employer: UNEMPLOYED  Tobacco Use  . Smoking status: Never Smoker  . Smokeless tobacco: Never Used  Substance and Sexual Activity  . Alcohol use: No  . Drug use: No  . Sexual activity: Not Currently    Birth control/protection: None, Post-menopausal  Other Topics Concern  . 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      No outpatient medications have been marked as taking for the 12/13/17 encounter (Office Visit) with McLean-Scocuzza, Nino Glow, MD.   Allergies  Allergen Reactions  . Latex Anaphylaxis and Rash  . Prasterone Other (See Comments) and Nausea And Vomiting    rash Other reaction(s): Headache Headaches.  . Shellfish-Derived Products Hives, Other (See Comments), Rash and Swelling    Facial swelling Uncoded Allergy. Allergen: seafood Uncoded Allergy. Allergen: CATS, Other Reaction: itch, wheezing Uncoded Allergy. Allergen: COMPAZINE, Other Reaction: tremors Facial swelling Facial swelling Uncoded Allergy. Allergen: seafood Uncoded Allergy.  Allergen: CATS, Other Reaction: itch, wheezing Uncoded Allergy. Allergen: COMPAZINE, Other Reaction: tremors Facial swelling Uncoded Allergy. Allergen: seafood Uncoded Allergy. Allergen: CATS, Other Reaction: itch, wheezing Uncoded Allergy. Allergen: COMPAZINE, Other Reaction: tremors Facial swelling  . Bee Venom Itching  . Compazine [Prochlorperazine Edisylate] Other (See Comments)    tremors  . Dhea [Nutritional Supplements] Other (See Comments)    Headaches.  . Dilaudid [Hydromorphone Hcl]     ? reaction  .  Lactose Intolerance (Gi)   . Other   . Prochlorperazine     Other reaction(s): Other (See Comments) ticks  . Promethazine     Other reaction(s): Other (See Comments) Ticks  . Promethazine Hcl Other (See Comments)    CNS disorder  . Reclast [Zoledronic Acid]     Weakness could not move limbs, fatigue, increase sleep  . Clarithromycin Hives and Rash  . Fish Allergy Hives, Swelling and Rash    Facial swelling   . Hydromorphone Hcl Rash and Hives    "Rash all over"  . Penicillins Rash and Other (See Comments)    Has patient had a PCN reaction causing immediate rash, facial/tongue/throat swelling, SOB or lightheadedness with hypotension:No Has patient had a PCN reaction causing severe rash involving mucus membranes or skin necrosis:No Has patient had a PCN reaction that required hospitalization:No Has patient had a PCN reaction occurring within the last 10 years:No If all of the above answers are "NO", then may proceed with Cephalosporin use.   Recent Results (from the past 2160 hour(s))  Basic metabolic panel     Status: Abnormal   Collection Time: 10/11/17  2:37 PM  Result Value Ref Range   Sodium 141 135 - 145 mmol/L   Potassium 4.0 3.5 - 5.1 mmol/L   Chloride 108 101 - 111 mmol/L   CO2 23 22 - 32 mmol/L   Glucose, Bld 104 (H) 65 - 99 mg/dL   BUN 16 6 - 20 mg/dL   Creatinine, Ser 0.87 0.44 - 1.00 mg/dL   Calcium 9.2 8.9 - 10.3 mg/dL   GFR calc non Af Amer >60  >60 mL/min   GFR calc Af Amer >60 >60 mL/min    Comment: (NOTE) The eGFR has been calculated using the CKD EPI equation. This calculation has not been validated in all clinical situations. eGFR's persistently <60 mL/min signify possible Chronic Kidney Disease.    Anion gap 10 5 - 15  CBC     Status: Abnormal   Collection Time: 10/11/17  2:37 PM  Result Value Ref Range   WBC 6.2 3.6 - 11.0 K/uL   RBC 4.61 3.80 - 5.20 MIL/uL   Hemoglobin 12.5 12.0 - 16.0 g/dL   HCT 40.0 35.0 - 47.0 %   MCV 86.8 80.0 - 100.0 fL   MCH 27.0 26.0 - 34.0 pg   MCHC 31.2 (L) 32.0 - 36.0 g/dL   RDW 18.2 (H) 11.5 - 14.5 %   Platelets 146 (L) 150 - 440 K/uL  Troponin I     Status: None   Collection Time: 10/11/17  2:37 PM  Result Value Ref Range   Troponin I <0.03 <0.03 ng/mL  Lipase, blood     Status: None   Collection Time: 10/11/17  2:37 PM  Result Value Ref Range   Lipase 31 11 - 51 U/L  Troponin I     Status: None   Collection Time: 10/11/17  6:35 PM  Result Value Ref Range   Troponin I <0.03 <0.03 ng/mL  Protime-INR     Status: None   Collection Time: 10/11/17  6:35 PM  Result Value Ref Range   Prothrombin Time 12.9 11.4 - 15.2 seconds   INR 0.98   APTT     Status: None   Collection Time: 10/11/17  6:35 PM  Result Value Ref Range   aPTT 29 24 - 36 seconds  Hepatitis C antibody     Status: None   Collection Time: 11/05/17 10:58 AM  Result Value Ref Range   Hepatitis C Ab NON-REACTIVE NON-REACTI   SIGNAL TO CUT-OFF 0.01 <1.00  CBC     Status: Abnormal   Collection Time: 11/05/17 10:58 AM  Result Value Ref Range   WBC 6.0 4.0 - 10.5 K/uL   RBC 4.21 3.87 - 5.11 Mil/uL   Platelets 163.0 150.0 - 400.0 K/uL   Hemoglobin 11.9 (L) 12.0 - 15.0 g/dL   HCT 37.8 36.0 - 46.0 %   MCV 89.7 78.0 - 100.0 fl   MCHC 31.5 30.0 - 36.0 g/dL   RDW 17.5 (H) 11.5 - 15.5 %  Lipid panel     Status: None   Collection Time: 11/05/17 10:58 AM  Result Value Ref Range   Cholesterol 153 0 - 200 mg/dL     Comment: ATP III Classification       Desirable:  < 200 mg/dL               Borderline High:  200 - 239 mg/dL          High:  > = 240 mg/dL   Triglycerides 88.0 0.0 - 149.0 mg/dL    Comment: Normal:  <150 mg/dLBorderline High:  150 - 199 mg/dL   HDL 72.30 >39.00 mg/dL   VLDL 17.6 0.0 - 40.0 mg/dL   LDL Cholesterol 63 0 - 99 mg/dL   Total CHOL/HDL Ratio 2     Comment:                Men          Women1/2 Average Risk     3.4          3.3Average Risk          5.0          4.42X Average Risk          9.6          7.13X Average Risk          15.0          11.0                       NonHDL 80.93     Comment: NOTE:  Non-HDL goal should be 30 mg/dL higher than patient's LDL goal (i.e. LDL goal of < 70 mg/dL, would have non-HDL goal of < 100 mg/dL)  Urinalysis, Routine w reflex microscopic     Status: Abnormal   Collection Time: 11/05/17 10:58 AM  Result Value Ref Range   Color, Urine YELLOW Yellow;Lt. Yellow   APPearance CLEAR Clear   Specific Gravity, Urine >=1.030 (A) 1.000 - 1.030   pH 6.0 5.0 - 8.0   Total Protein, Urine TRACE (A) Negative   Urine Glucose NEGATIVE Negative   Ketones, ur TRACE (A) Negative   Bilirubin Urine LARGE (A) Negative   Hgb urine dipstick NEGATIVE Negative   Urobilinogen, UA 0.2 0.0 - 1.0   Leukocytes, UA NEGATIVE Negative   Nitrite NEGATIVE Negative   WBC, UA 0-2/hpf 0-2/hpf   RBC / HPF none seen 0-2/hpf   Squamous Epithelial / LPF Many(>10/hpf) (A) Rare(0-4/hpf)   Renal Epithel, UA Rare(0-4/hpf) (A) None   Bacteria, UA Few(10-50/hpf) (A) None   Ca Oxalate Crys, UA Presence of (A) None  TSH     Status: None   Collection Time: 11/05/17 10:58 AM  Result Value Ref Range   TSH 3.92 0.35 - 4.50 uIU/mL  T4, free     Status:  None   Collection Time: 11/05/17 10:58 AM  Result Value Ref Range   Free T4 0.72 0.60 - 1.60 ng/dL    Comment: Specimens from patients who are undergoing biotin therapy and /or ingesting biotin supplements may contain high levels of biotin.   The higher biotin concentration in these specimens interferes with this Free T4 assay.  Specimens that contain high levels  of biotin may cause false high results for this Free T4 assay.  Please interpret results in light of the total clinical presentation of the patient.    VITAMIN D 25 Hydroxy (Vit-D Deficiency, Fractures)     Status: None   Collection Time: 11/05/17 10:58 AM  Result Value Ref Range   VITD 43.35 30.00 - 100.00 ng/mL   Objective  Body mass index is 34.76 kg/m. Wt Readings from Last 3 Encounters:  12/13/17 232 lb (105.2 kg)  11/03/17 229 lb 2 oz (103.9 kg)  10/11/17 223 lb (101.2 kg)   Temp Readings from Last 3 Encounters:  12/13/17 98.3 F (36.8 C) (Oral)  11/03/17 97.9 F (36.6 C) (Oral)  10/11/17 97.7 F (36.5 C) (Oral)   BP Readings from Last 3 Encounters:  12/13/17 130/70  11/03/17 118/68  10/11/17 (!) 123/58   Pulse Readings from Last 3 Encounters:  12/13/17 90  11/03/17 83  10/11/17 74   O2 sat room air 98% Physical Exam  Constitutional: She is oriented to person, place, and time and well-developed, well-nourished, and in no distress. Vital signs are normal.  HENT:  Head: Normocephalic and atraumatic.  Mouth/Throat: Oropharynx is clear and moist and mucous membranes are normal.  Eyes: Conjunctivae are normal. Pupils are equal, round, and reactive to light.  Cardiovascular: Normal rate, regular rhythm and normal heart sounds.  Pulmonary/Chest: Effort normal and breath sounds normal.  Neurological: She is alert and oriented to person, place, and time. Gait normal.  +tremor on exam  Nl strength upper and lower ext b/l  Dec sensation Left V3 face and left lower leg though per pt left lower leg chronic  CN 2-12 grossly intact Walks with cane   Skin: Skin is warm and dry.  Psychiatric: Mood, memory, affect and judgment normal.  Nursing note and vitals reviewed.   Assessment   1. Headache and dizziness (cbg 99 today) today with orthostatics +  also with V3 abnormal sensation/numbness and left lower leg numbness.ddx migraine, anxiety, psychosomatic. 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 Abdominal pain and constipation  3. Increased thirst with h/o A1C 6.7 02/15/13 last 4.9 05/28/14  4. HM  5. Overactive bladder  Plan  1.  Stat MRI  chg Given toradol injection 60 mg x 1  For further h/as she needs to f/u with neurology  2.  Pending MRA abdomen per pt  Follows with [facility]  She is on a lot of medications that can be constipating add Miralax qd could also try Senna and colace  If this does not help consider Linzess  3. Check A1C at f/u  4.  Had flu shot  Had pna 23, prevnar, Tdap  Think about shingrix vaccine disc today   See other HM 11/03/17  Colonoscopy 2015 h/o polys DEXA 06/29/17 osteopenia Mammogram 06/11/17 neg  Pap ask about at f/u likely need to do in future   Of note CT 09/2017 with ground glass appearance will need repeat in 09/2018 never smoker  5. Restart vesicare   Of note check A1C in future  Provider: Dr. Olivia Mackie McLean-Scocuzza-Internal Medicine

## 2018-01-03 DIAGNOSIS — F3181 Bipolar II disorder: Secondary | ICD-10-CM | POA: Diagnosis not present

## 2018-01-11 ENCOUNTER — Other Ambulatory Visit: Payer: Self-pay | Admitting: Internal Medicine

## 2018-01-11 DIAGNOSIS — F3181 Bipolar II disorder: Secondary | ICD-10-CM | POA: Diagnosis not present

## 2018-01-11 DIAGNOSIS — F419 Anxiety disorder, unspecified: Secondary | ICD-10-CM | POA: Diagnosis not present

## 2018-01-11 DIAGNOSIS — G479 Sleep disorder, unspecified: Secondary | ICD-10-CM | POA: Diagnosis not present

## 2018-01-11 NOTE — Progress Notes (Signed)
Records from [facility] no pap in records or labs   [place]

## 2018-01-26 ENCOUNTER — Emergency Department: Payer: Medicare Other

## 2018-01-26 ENCOUNTER — Emergency Department
Admission: EM | Admit: 2018-01-26 | Discharge: 2018-01-26 | Disposition: A | Discharge status: Home / Self Care | Payer: Medicare Other | Attending: Emergency Medicine | Admitting: Emergency Medicine

## 2018-01-26 ENCOUNTER — Encounter: Payer: Self-pay | Admitting: Emergency Medicine

## 2018-01-26 DIAGNOSIS — E119 Type 2 diabetes mellitus without complications: Secondary | ICD-10-CM | POA: Insufficient documentation

## 2018-01-26 DIAGNOSIS — S199XXA Unspecified injury of neck, initial encounter: Secondary | ICD-10-CM | POA: Diagnosis not present

## 2018-01-26 DIAGNOSIS — S0003XA Contusion of scalp, initial encounter: Secondary | ICD-10-CM | POA: Insufficient documentation

## 2018-01-26 DIAGNOSIS — Z7982 Long term (current) use of aspirin: Secondary | ICD-10-CM | POA: Insufficient documentation

## 2018-01-26 DIAGNOSIS — W19XXXA Unspecified fall, initial encounter: Secondary | ICD-10-CM

## 2018-01-26 DIAGNOSIS — Y9389 Activity, other specified: Secondary | ICD-10-CM | POA: Insufficient documentation

## 2018-01-26 DIAGNOSIS — S0993XA Unspecified injury of face, initial encounter: Secondary | ICD-10-CM | POA: Diagnosis not present

## 2018-01-26 DIAGNOSIS — Z79899 Other long term (current) drug therapy: Secondary | ICD-10-CM | POA: Insufficient documentation

## 2018-01-26 DIAGNOSIS — S0083XA Contusion of other part of head, initial encounter: Secondary | ICD-10-CM | POA: Diagnosis not present

## 2018-01-26 DIAGNOSIS — Y92009 Unspecified place in unspecified non-institutional (private) residence as the place of occurrence of the external cause: Secondary | ICD-10-CM

## 2018-01-26 DIAGNOSIS — J45909 Unspecified asthma, uncomplicated: Secondary | ICD-10-CM | POA: Diagnosis not present

## 2018-01-26 DIAGNOSIS — Z96641 Presence of right artificial hip joint: Secondary | ICD-10-CM | POA: Insufficient documentation

## 2018-01-26 DIAGNOSIS — E039 Hypothyroidism, unspecified: Secondary | ICD-10-CM | POA: Diagnosis not present

## 2018-01-26 DIAGNOSIS — W01198A Fall on same level from slipping, tripping and stumbling with subsequent striking against other object, initial encounter: Secondary | ICD-10-CM | POA: Insufficient documentation

## 2018-01-26 DIAGNOSIS — Y999 Unspecified external cause status: Secondary | ICD-10-CM | POA: Diagnosis not present

## 2018-01-26 DIAGNOSIS — S7001XA Contusion of right hip, initial encounter: Secondary | ICD-10-CM | POA: Insufficient documentation

## 2018-01-26 DIAGNOSIS — S0990XA Unspecified injury of head, initial encounter: Secondary | ICD-10-CM | POA: Diagnosis not present

## 2018-01-26 DIAGNOSIS — Y929 Unspecified place or not applicable: Secondary | ICD-10-CM | POA: Insufficient documentation

## 2018-01-26 DIAGNOSIS — R51 Headache: Secondary | ICD-10-CM | POA: Diagnosis not present

## 2018-01-26 DIAGNOSIS — S161XXA Strain of muscle, fascia and tendon at neck level, initial encounter: Secondary | ICD-10-CM | POA: Insufficient documentation

## 2018-01-26 HISTORY — DX: Anemia, unspecified: D64.9

## 2018-01-26 NOTE — ED Provider Notes (Signed)
[facility] Emergency Department Provider Note  ____________________________________________   First MD Initiated Contact with Patient 01/26/18 1115     (approximate)  I have reviewed the triage vital signs and the nursing notes.   HISTORY  Chief Complaint Fall   HPI [name] [name] is a 52 y.o. female presents to the emergency department after a fall at home.  Patient states she was attempting to put her pants on when she lost her balance and fell to the floor.  Patient states that she landed on the right side of her face and body against a concrete floor.  Patient denies any loss of consciousness.  She has experienced some nausea with dizziness since her fall.  Patient has been ambulatory since her accident and drove herself to the emergency department.  She continues to have facial pain on the right side.  She denies any paresthesias into her lower extremities.  She rates her pain is not over 10.   Past Medical History:  Diagnosis Date  . ADHD   . Anemia   . ANXIETY 06/06/2007  . Arthritis    related to lupus  . Asthma   . BACK PAIN 02/22/2009  . Cervical cancer ([place])   . Cervical cancer ([place])    LEEP 2005   . Chicken pox   . Collagen vascular disease ([place])   . DEPRESSION 06/06/2007  . Essential tremor   . Fibromyalgia   . Fibromyalgia   . FREQUENCY, URINARY 07/07/2007  . Gastritis   . GERD 06/06/2007  . Glaucoma   . Headache    h/o migraines   . History of hiatal hernia   . History of kidney stones   . HYPOTHYROIDISM 06/06/2007  . INTERSTITIAL CYSTITIS 09/11/2010  . Lupus 2006  . Lupus   . Menopause   . Migraine   . Mitral valve regurgitation   . NEPHROLITHIASIS, HX OF 11/05/2010  . Neuropathy   . Neuropathy   . OCD (obsessive compulsive disorder)   . Optic neuritis    2005  . OSTEOPENIA 10/14/2009  . Osteoporosis   . OVERACTIVE BLADDER 10/14/2009  . PAIN, CHRONIC NEC 06/06/2007  . Panniculitis   . POLYARTHRITIS 08/19/2007  .  Raynaud's disease   . Sicca syndrome ([place])   . 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 Vaginal prolapse    Dr. Marcelline Mates [facility]   . Vasculitis ([place])   . WEIGHT GAIN 02/22/2009    Patient Active Problem List   Diagnosis Date Noted  . Allergic rhinitis 12/16/2017  . Dizziness 12/16/2017  . Orthostatic hypotension 12/16/2017  . Generalized abdominal pain 12/16/2017  . Constipation 12/16/2017  . Acute nonintractable headache 12/13/2017  . Fibromyalgia 11/04/2017  . Status post hardware removal 02/16/2017  . Sicca syndrome ([place]) 06/04/2014  . Abdominal pain, epigastric 09/08/2013  . Type II or unspecified type diabetes mellitus without mention of complication, uncontrolled 03/15/2013  . UTI (urinary tract infection) 02/15/2013  . Chronic pain 03/31/2011  . NEPHROLITHIASIS, HX OF 11/05/2010  . INTERSTITIAL CYSTITIS 09/11/2010  . Overactive bladder 10/14/2009  . Disorder of bone and cartilage 10/14/2009  . Backache 02/22/2009  . WEIGHT GAIN 02/22/2009  . 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 POLYARTHRITIS 08/19/2007  . FREQUENCY, URINARY 07/07/2007  . Hypothyroidism 06/06/2007  . Anxiety state 06/06/2007  . Depression, recurrent ([place]) 06/06/2007  . PAIN, CHRONIC NEC 06/06/2007  . GERD 06/06/2007    Past Surgical History:  Procedure Laterality Date  . APPENDECTOMY    . CERVICAL  BIOPSY  W/ LOOP ELECTRODE EXCISION  2005  . CHOLECYSTECTOMY    . ESOPHAGOGASTRODUODENOSCOPY (EGD) WITH PROPOFOL N/A 08/27/2017   Procedure: ESOPHAGOGASTRODUODENOSCOPY (EGD) WITH PROPOFOL;  Surgeon: Manya Silvas, MD;  Location: [facility] ENDOSCOPY;  Service: Endoscopy;  Laterality: N/A;  . HAND SURGERY     repair of left metatarsal   . HARDWARE REMOVAL Right 02/16/2017   Procedure: HARDWARE REMOVAL FROM HIP;  Surgeon: Hessie Knows, MD;  Location: [facility] ORS;  Service: Orthopedics;  Laterality: Right;  . JOINT REPLACEMENT    . OTHER SURGICAL HISTORY     left 3rd metatarsal fracture repair 2011   .  REVISION TOTAL HIP ARTHROPLASTY  06/2009   replacement 2010 then screw and plate removal 1610; right hip  . TONSILLECTOMY    . TONSILLECTOMY    . wrist  ligament repair bilateral    . WRIST RECONSTRUCTION    . WRIST SURGERY     laceration of right wrist ligaments  . wrist surgery     laceration of left wrist surgery     Prior to Admission medications   Medication Sig Start Date End Date Taking? Authorizing Provider  albuterol (VENTOLIN HFA) 108 (90 Base) MCG/ACT inhaler Ventolin HFA 90 mcg/actuation aerosol inhaler  INHALE 2 PUFFS (180 MCG) BY INHALATION ROUTE EVERY 4 HOURS    [provider]  ARIPiprazole (ABILIFY) 20 MG tablet Take 20 mg by mouth daily.    [provider]  Ascorbic Acid (VITAMIN C) 500 MG CHEW Chew 2,500 mg by mouth daily.    [provider]  aspirin 325 MG tablet Take 1 tablet (325 mg total) by mouth daily. 02/18/17   Duanne Guess, PA-C  Biotin 10 MG CAPS Take by mouth.    [provider]  Biotin 5000 MCG SUBL Place 10,000 mcg under the tongue daily.    [provider]  budesonide-formoterol (SYMBICORT) 160-4.5 MCG/ACT inhaler Symbicort 160 mcg-4.5 mcg/actuation HFA aerosol inhaler    [provider]  calcipotriene (DOVONOX) 0.005 % ointment calcipotriene 0.005 % topical ointment  APPLY A THIN LAYER TO THE AFFECTED AREA(S) BY TOPICAL ROUTE ONCE DAILY ; RUB IN GENTLY AND COMPLETELY    [provider]  Cholecalciferol (VITAMIN D3) 2000 units capsule Vitamin D3 2,000 unit capsule    [provider]  dapsone 25 MG tablet Take by mouth daily.     [provider]  dexmethylphenidate (FOCALIN XR) 20 MG 24 hr capsule dexmethylphenidate ER 20 mg capsule,extended release biphasic50-50    [provider]  diclofenac sodium (VOLTAREN) 1 % GEL Apply 2 g topically 4 (four) times daily as needed (for knee & finger pain.).     [provider]  dicyclomine (BENTYL) 10 MG capsule  dicyclomine 10 mg capsule  every 8hours as needed    [provider]  DULoxetine (CYMBALTA) 60 MG capsule duloxetine 60 mg capsule,delayed release  1 twice daily    [provider]  DULoxetine (CYMBALTA) 60 MG capsule Take 60 mg by mouth 2 (two) times daily.    [provider]  EPINEPHrine (EPIPEN 2-PAK) 0.3 mg/0.3 mL IJ SOAJ injection EpiPen 2-Pak 0.3 mg/0.3 mL injection, auto-injector    [provider]  etodolac (LODINE) 500 MG tablet etodolac 500 mg tablet    [provider]  fentaNYL (DURAGESIC - DOSED MCG/HR) 75 MCG/HR Place 75 mcg onto the skin every 3 (three) days.    [provider]  fluocinonide ointment (LIDEX) 0.05 % fluocinonide 0.05 % topical  ointment  APPLY TO THE AFFECTED AREA(S) BY TOPICAL ROUTE 2 TIMES PER DAY    [provider]  folic acid (FOLVITE) 1 MG tablet Take 1 mg by mouth daily.    [provider]  hydrocortisone (PROCTOZONE-HC) 2.5 % rectal cream Proctozone-HC 2.5 % topical cream perineal applicator    [provider]  hydroxychloroquine (PLAQUENIL) 200 MG tablet Plaquenil 200 mg tablet  Take 1 tablet twice a day by oral route after meals for 90 days.    [provider]  ibuprofen (ADVIL,MOTRIN) 800 MG tablet Take 1 tablet (800 mg total) by mouth every 8 (eight) hours as needed. 08/04/17   Laban Emperor, PA-C  imiquimod (ALDARA) 5 % cream imiquimod 5 % topical cream packet  APPLY TO THE AFFECTED AREA(S) BY TOPICAL ROUTE 3 TIMES PER WEEK at bedtime    [provider]  ketorolac (TORADOL) 10 MG tablet Take 1 tablet (10 mg total) every 8 (eight) hours as needed by mouth for moderate pain (with food). 10/11/17   Eula Listen, MD  levothyroxine (SYNTHROID, LEVOTHROID) 25 MCG tablet Take 1 tablet (25 mcg total) by mouth daily before breakfast. 11/11/17   McLean-Scocuzza, Nino Glow, MD  lidocaine (LIDODERM) 5 % Place 1 patch onto the skin every 12 (twelve) hours. Remove &  Discard patch within 12 hours or as directed by MD 08/04/17 08/04/18  Laban Emperor, PA-C  LORazepam (ATIVAN) 2 MG tablet Take 1-2 mg by mouth See admin instructions. 1 MG DAILY AS NEEDED FOR ANXIETY & SCHEDULED AT BEDTIME EACH NIGHT    [provider]  methylphenidate 18 MG PO CR tablet Take by mouth.     [provider]  montelukast (SINGULAIR) 10 MG tablet montelukast 10 mg tablet 1 pill qhs 12/13/17   McLean-Scocuzza, Nino Glow, MD  Multiple Vitamins-Minerals (MULTI-VITAMIN GUMMIES PO) Multi Vitamin  GUMMIES    [provider]  ondansetron (ZOFRAN) 4 MG tablet Zofran 8 mg tablet  Take 1 tablet every 8 hours by oral route as needed.    [provider]  oxyCODONE-acetaminophen (PERCOCET/ROXICET) 5-325 MG tablet Take 1-2 tablets by mouth every 4 (four) hours as needed for moderate pain. 02/17/17   Duanne Guess, PA-C  pantoprazole (PROTONIX) 40 MG tablet Take one tablet by mouth twice daily Patient taking differently: TAKE 1 TABLET (40 MG) BY MOUTH ONCE DAILY IN THE MORNING. 12/18/13   Marletta Lor, MD  PARoxetine (PAXIL) 10 MG tablet TAKE 1 TABLET BY MOUTH AT BEDTIME. 08/30/17   Rubie Maid, MD  Polyvinyl Alcohol-Povidone (REFRESH OP) Place 1 drop into both eyes 2 (two) times daily.    [provider]  predniSONE (DELTASONE) 10 MG tablet prednisone 10 mg tablet  1 daily    [provider]  Probiotic Product (PROBIOTIC PO) Probiotic 10 billion cell capsule  daily    [provider]  silver sulfADIAZINE (SILVADENE) 1 % cream silver sulfadiazine 1 % topical cream    [provider]  Sodium Chloride 3 % AERS Saline Nasal Mist 3 %  spray nose deeply every hour or two and blow nose    [provider]  solifenacin (VESICARE) 5 MG tablet Take 1 tablet (5 mg total) by mouth daily. 12/13/17   McLean-Scocuzza, Nino Glow, MD  sucralfate (CARAFATE) 1 g tablet sucralfate 1 gram tablet    [provider]  topiramate  (TOPAMAX) 100 MG tablet topiramate 100 mg tablet  TAKE 1 TABLET BY MOUTH 2 TIMES DAILY.  [provider]  valACYclovir (VALTREX) 500 MG tablet valacyclovir 500 mg tablet  1 daily    [provider]    Allergies Latex; Prasterone; Shellfish-derived products; Bee venom; Compazine [prochlorperazine edisylate]; Dhea [nutritional supplements]; Dilaudid [hydromorphone hcl]; Lactose intolerance (gi); Other; Prochlorperazine; Promethazine; Promethazine hcl; Reclast [zoledronic acid]; Clarithromycin; Fish allergy; Hydromorphone hcl; and Penicillins  Family History  Problem Relation Age of Onset  . Hypertension Mother   . Arthritis Mother   . Heart disease Mother        afib  . Asthma Sister   . Asthma Daughter   . Arthritis Daughter   . Heart disease Daughter        ?heart condition on BB  . ADD / ADHD Daughter   . Pancreatic cancer Father   . Cancer Father        pancreatitic   . Diabetes Maternal Grandmother   . Heart disease Maternal Grandmother   . Arthritis Maternal Grandmother   . Hypertension Maternal Grandmother   . Colon cancer Maternal Uncle 50  . Crohn's disease Maternal Aunt   . Diabetes Maternal Aunt   . Breast cancer Cousin 44       maternal  . Cancer Cousin        m cousin breat cancer s/p removal both breasts     Social History Social History   Tobacco Use  . Smoking status: Never Smoker  . Smokeless tobacco: Never Used  Substance Use Topics  . Alcohol use: No  . Drug use: No    Review of Systems Constitutional: No fever/chills Eyes: No visual changes.  Positive dizziness. ENT: Positive for right sided facial pain. Cardiovascular: Denies chest pain. Respiratory: Denies shortness of breath. Gastrointestinal: No abdominal pain.  Positive nausea, no vomiting.  Musculoskeletal: Positive for right arm pain, right rib pain, right hip pain. Skin: Negative for rash. Neurological: Negative for headaches, focal weakness or  numbness.  ____________________________________________   PHYSICAL EXAM:  VITAL SIGNS: ED Triage Vitals [01/26/18 1057]  Enc Vitals Group     BP (!) 169/90     Pulse Rate 97     Resp 15     Temp 97.8 F (36.6 C)     Temp Source Oral     SpO2 95 %     Weight 224 lb (101.6 kg)     Height 5\' 10"  (1.778 m)     Head Circumference      Peak Flow      Pain Score 9     Pain Loc      Pain Edu?      Excl. in [place]?    Constitutional: Alert and oriented. Well appearing and in no acute distress.  Mild upper and lower extremity tremor noted. Eyes: Conjunctivae are normal. PERRL. EOMI. Head: Atraumatic. Nose: No evidence of trauma. Mouth/Throat: Mucous membranes are moist.  Oropharynx non-erythematous.  On examination of the face there is some tenderness on palpation of the right periorbital area without swelling.  Pain is increased with patient biting on a tongue depressor on the right.  She states that her teeth are fitting normally. Neck: No stridor.  There is some mild tenderness on palpation of cervical spine posteriorly.  There is tenderness on palpation of the cervical muscles laterally especially on the right. Cardiovascular: Normal rate, regular rhythm. Grossly normal heart sounds.  Good peripheral circulation. Respiratory: Normal respiratory effort.  No retractions. Lungs CTAB. Gastrointestinal: Soft and nontender. No distention.  Musculoskeletal: Moves upper extremities without  any difficulty.  There is no evidence of injury and no soft tissue abrasions or swelling present.  Range of motion is without crepitus.  On examination of the right ribs there is no point tenderness, no soft tissue swelling, abrasions or ecchymosis.  On examination of the right hip she is status post total hip replacement with a well-healed lateral incision.  Range of motion is without guarding and within normal limits.  No soft tissue swelling or abrasions were seen.  Patient is ambulatory with the use of a  cane. Neurologic:  Normal speech and language. No gross focal neurologic deficits are appreciated.  Skin:  Skin is warm, dry and intact. No rash noted. Psychiatric: Mood and affect are normal. Speech and behavior are normal.  ____________________________________________   LABS (all labs ordered are listed, but only abnormal results are displayed)  Labs Reviewed - No data to display  RADIOLOGY  ED MD interpretation:   CT scans were reviewed after radiology results were posted.  Official radiology report(s): Ct Head Wo Contrast  Result Date: 01/26/2018 CLINICAL DATA:  Facial pain after fall at home. EXAM: CT HEAD WITHOUT CONTRAST CT MAXILLOFACIAL WITHOUT CONTRAST CT CERVICAL SPINE WITHOUT CONTRAST TECHNIQUE: Multidetector CT imaging of the head, cervical spine, and maxillofacial structures were performed using the standard protocol without intravenous contrast. Multiplanar CT image reconstructions of the cervical spine and maxillofacial structures were also generated. COMPARISON:  CT scan of March 22, 2016. FINDINGS: CT HEAD FINDINGS Brain: No evidence of acute infarction, hemorrhage, hydrocephalus, extra-axial collection or mass lesion/mass effect. Vascular: No hyperdense vessel or unexpected calcification. Skull: Normal. Negative for fracture or focal lesion. Other: None. CT MAXILLOFACIAL FINDINGS Osseous: No fracture or mandibular dislocation. No destructive process. Orbits: Negative. No traumatic or inflammatory finding. Sinuses: Clear. Soft tissues: Negative. CT CERVICAL SPINE FINDINGS Alignment: Minimal grade 1 anterolisthesis of C4-5 is noted secondary to posterior facet joint hypertrophy. Skull base and vertebrae: No acute fracture. No primary bone lesion or focal pathologic process. Soft tissues and spinal canal: No prevertebral fluid or swelling. No visible canal hematoma. Disc levels: Moderate degenerative disc disease is noted at C4-5, C5-6 and C6-7 with anterior osteophyte formation.  Upper chest: Negative. Other: Degenerative changes seen involving the left-sided posterior facet joints. IMPRESSION: Normal head CT. No abnormality seen in maxillofacial region. Moderate multilevel degenerative disc disease. No acute abnormality seen in the cervical spine. Electronically Signed   By: Marijo Conception, M.D.   On: 01/26/2018 12:21   Ct Cervical Spine Wo Contrast  Result Date: 01/26/2018 CLINICAL DATA:  Facial pain after fall at home. EXAM: CT HEAD WITHOUT CONTRAST CT MAXILLOFACIAL WITHOUT CONTRAST CT CERVICAL SPINE WITHOUT CONTRAST TECHNIQUE: Multidetector CT imaging of the head, cervical spine, and maxillofacial structures were performed using the standard protocol without intravenous contrast. Multiplanar CT image reconstructions of the cervical spine and maxillofacial structures were also generated. COMPARISON:  CT scan of March 22, 2016. FINDINGS: CT HEAD FINDINGS Brain: No evidence of acute infarction, hemorrhage, hydrocephalus, extra-axial collection or mass lesion/mass effect. Vascular: No hyperdense vessel or unexpected calcification. Skull: Normal. Negative for fracture or focal lesion. Other: None. CT MAXILLOFACIAL FINDINGS Osseous: No fracture or mandibular dislocation. No destructive process. Orbits: Negative. No traumatic or inflammatory finding. Sinuses: Clear. Soft tissues: Negative. CT CERVICAL SPINE FINDINGS Alignment: Minimal grade 1 anterolisthesis of C4-5 is noted secondary to posterior facet joint hypertrophy. Skull base and vertebrae: No acute fracture. No primary bone lesion or focal pathologic process. Soft tissues and spinal canal: No  prevertebral fluid or swelling. No visible canal hematoma. Disc levels: Moderate degenerative disc disease is noted at C4-5, C5-6 and C6-7 with anterior osteophyte formation. Upper chest: Negative. Other: Degenerative changes seen involving the left-sided posterior facet joints. IMPRESSION: Normal head CT. No abnormality seen in maxillofacial  region. Moderate multilevel degenerative disc disease. No acute abnormality seen in the cervical spine. Electronically Signed   By: Marijo Conception, M.D.   On: 01/26/2018 12:21   Ct Maxillofacial Wo Contrast  Result Date: 01/26/2018 CLINICAL DATA:  Facial pain after fall at home. EXAM: CT HEAD WITHOUT CONTRAST CT MAXILLOFACIAL WITHOUT CONTRAST CT CERVICAL SPINE WITHOUT CONTRAST TECHNIQUE: Multidetector CT imaging of the head, cervical spine, and maxillofacial structures were performed using the standard protocol without intravenous contrast. Multiplanar CT image reconstructions of the cervical spine and maxillofacial structures were also generated. COMPARISON:  CT scan of March 22, 2016. FINDINGS: CT HEAD FINDINGS Brain: No evidence of acute infarction, hemorrhage, hydrocephalus, extra-axial collection or mass lesion/mass effect. Vascular: No hyperdense vessel or unexpected calcification. Skull: Normal. Negative for fracture or focal lesion. Other: None. CT MAXILLOFACIAL FINDINGS Osseous: No fracture or mandibular dislocation. No destructive process. Orbits: Negative. No traumatic or inflammatory finding. Sinuses: Clear. Soft tissues: Negative. CT CERVICAL SPINE FINDINGS Alignment: Minimal grade 1 anterolisthesis of C4-5 is noted secondary to posterior facet joint hypertrophy. Skull base and vertebrae: No acute fracture. No primary bone lesion or focal pathologic process. Soft tissues and spinal canal: No prevertebral fluid or swelling. No visible canal hematoma. Disc levels: Moderate degenerative disc disease is noted at C4-5, C5-6 and C6-7 with anterior osteophyte formation. Upper chest: Negative. Other: Degenerative changes seen involving the left-sided posterior facet joints. IMPRESSION: Normal head CT. No abnormality seen in maxillofacial region. Moderate multilevel degenerative disc disease. No acute abnormality seen in the cervical spine. Electronically Signed   By: Marijo Conception, M.D.   On: 01/26/2018  12:21    ____________________________________________   PROCEDURES  Procedure(s) performed: None  Procedures  Critical Care performed: No  ____________________________________________   INITIAL IMPRESSION / ASSESSMENT AND PLAN / ED COURSE Patient was made aware that no fractures were noted.  She is to place ice to her face as needed for swelling and reduce pain.  She is aware that she also does not have any suspicion of a fracture to her right hip or right arm.  Patient was ambulatory without assistance other than her cane.  Patient is to continue her regular medication and take Tylenol if needed for pain. ____________________________________________   FINAL CLINICAL IMPRESSION(S) / ED DIAGNOSES  Final diagnoses:  Contusion of face, initial encounter  Cervical strain, acute, initial encounter  Contusion of scalp, initial encounter  Contusion of right hip, initial encounter  Fall in home, initial encounter     ED Discharge Orders    None       Note:  This document was prepared using Dragon voice recognition software and may include unintentional dictation errors.    Johnn Hai, PA-C 01/26/18 1645    Earleen Newport, MD 01/27/18 0700

## 2018-01-26 NOTE — ED Triage Notes (Signed)
Patient presents to ED via POV from home post fall. Patient reports she was attempting to put her pants on when she lost her balance and slid to the ground. Patient reports right sided pain including right side of her face. Patient denies LOC.

## 2018-01-26 NOTE — ED Notes (Signed)
Pt ambulatory to POV with cane. VSS. NAD. Discharge  Instructions and follow up discussed with patient. All questions answered.

## 2018-01-26 NOTE — Discharge Instructions (Signed)
Follow-up with your primary care doctor if any continued problems.  Take Tylenol if needed for pain.  Continue your regular medication.  Use your cane with walking.

## 2018-01-27 DIAGNOSIS — M797 Fibromyalgia: Secondary | ICD-10-CM | POA: Diagnosis not present

## 2018-01-27 DIAGNOSIS — Z9225 Personal history of immunosupression therapy: Secondary | ICD-10-CM | POA: Diagnosis not present

## 2018-01-27 DIAGNOSIS — M3500 Sicca syndrome, unspecified: Secondary | ICD-10-CM | POA: Diagnosis not present

## 2018-01-27 DIAGNOSIS — I7300 Raynaud's syndrome without gangrene: Secondary | ICD-10-CM | POA: Diagnosis not present

## 2018-01-27 DIAGNOSIS — Z79899 Other long term (current) drug therapy: Secondary | ICD-10-CM | POA: Diagnosis not present

## 2018-01-27 DIAGNOSIS — T380X5A Adverse effect of glucocorticoids and synthetic analogues, initial encounter: Secondary | ICD-10-CM | POA: Diagnosis not present

## 2018-01-27 DIAGNOSIS — M8580 Other specified disorders of bone density and structure, unspecified site: Secondary | ICD-10-CM | POA: Diagnosis not present

## 2018-01-31 DIAGNOSIS — F3181 Bipolar II disorder: Secondary | ICD-10-CM | POA: Diagnosis not present

## 2018-02-08 DIAGNOSIS — H04123 Dry eye syndrome of bilateral lacrimal glands: Secondary | ICD-10-CM | POA: Diagnosis not present

## 2018-02-10 ENCOUNTER — Telehealth: Payer: Self-pay | Admitting: Internal Medicine

## 2018-02-10 NOTE — Telephone Encounter (Signed)
Pt dropped off paper work for Dr. Aundra Dubin to complete. Paper work is up front

## 2018-02-14 NOTE — Telephone Encounter (Signed)
Paperwork has been placed in provider folder to be filled out when provider returns.

## 2018-02-22 NOTE — Telephone Encounter (Signed)
Pt calling to check status on her disability forms being filled out. Pt needs the forms completed and faxed by the 29th of March. Fax to [place]. Fax number is on paperwork per pt.

## 2018-02-23 ENCOUNTER — Other Ambulatory Visit: Payer: Self-pay | Admitting: Nurse Practitioner

## 2018-02-23 ENCOUNTER — Emergency Department: Payer: Medicare Other

## 2018-02-23 ENCOUNTER — Other Ambulatory Visit: Payer: Self-pay

## 2018-02-23 ENCOUNTER — Emergency Department
Admission: EM | Admit: 2018-02-23 | Discharge: 2018-02-24 | Disposition: A | Discharge status: Home / Self Care | Payer: Medicare Other | Attending: Emergency Medicine | Admitting: Emergency Medicine

## 2018-02-23 DIAGNOSIS — R251 Tremor, unspecified: Secondary | ICD-10-CM | POA: Diagnosis not present

## 2018-02-23 DIAGNOSIS — R0989 Other specified symptoms and signs involving the circulatory and respiratory systems: Secondary | ICD-10-CM | POA: Diagnosis not present

## 2018-02-23 DIAGNOSIS — Z91040 Latex allergy status: Secondary | ICD-10-CM | POA: Diagnosis not present

## 2018-02-23 DIAGNOSIS — Z96641 Presence of right artificial hip joint: Secondary | ICD-10-CM | POA: Diagnosis not present

## 2018-02-23 DIAGNOSIS — J45909 Unspecified asthma, uncomplicated: Secondary | ICD-10-CM | POA: Insufficient documentation

## 2018-02-23 DIAGNOSIS — G43119 Migraine with aura, intractable, without status migrainosus: Secondary | ICD-10-CM | POA: Diagnosis not present

## 2018-02-23 DIAGNOSIS — E1140 Type 2 diabetes mellitus with diabetic neuropathy, unspecified: Secondary | ICD-10-CM | POA: Diagnosis not present

## 2018-02-23 DIAGNOSIS — S0990XA Unspecified injury of head, initial encounter: Secondary | ICD-10-CM | POA: Diagnosis not present

## 2018-02-23 DIAGNOSIS — R51 Headache: Secondary | ICD-10-CM | POA: Insufficient documentation

## 2018-02-23 DIAGNOSIS — R519 Headache, unspecified: Secondary | ICD-10-CM

## 2018-02-23 DIAGNOSIS — G44311 Acute post-traumatic headache, intractable: Secondary | ICD-10-CM | POA: Diagnosis not present

## 2018-02-23 DIAGNOSIS — H9201 Otalgia, right ear: Secondary | ICD-10-CM | POA: Insufficient documentation

## 2018-02-23 DIAGNOSIS — M542 Cervicalgia: Secondary | ICD-10-CM | POA: Diagnosis not present

## 2018-02-23 DIAGNOSIS — Z8541 Personal history of malignant neoplasm of cervix uteri: Secondary | ICD-10-CM | POA: Diagnosis not present

## 2018-02-23 DIAGNOSIS — S199XXA Unspecified injury of neck, initial encounter: Secondary | ICD-10-CM | POA: Diagnosis not present

## 2018-02-23 DIAGNOSIS — G44301 Post-traumatic headache, unspecified, intractable: Secondary | ICD-10-CM

## 2018-02-23 DIAGNOSIS — Z7982 Long term (current) use of aspirin: Secondary | ICD-10-CM | POA: Diagnosis not present

## 2018-02-23 DIAGNOSIS — Z7902 Long term (current) use of antithrombotics/antiplatelets: Secondary | ICD-10-CM | POA: Diagnosis not present

## 2018-02-23 DIAGNOSIS — S0993XA Unspecified injury of face, initial encounter: Secondary | ICD-10-CM | POA: Diagnosis not present

## 2018-02-23 DIAGNOSIS — Z79899 Other long term (current) drug therapy: Secondary | ICD-10-CM | POA: Insufficient documentation

## 2018-02-23 MED ORDER — BUTALBITAL-APAP-CAFFEINE 50-325-40 MG PO TABS
1.0000 | ORAL_TABLET | Freq: Once | ORAL | Status: AC
  Administered 2018-02-24: 1 via ORAL
  Filled 2018-02-23: qty 1

## 2018-02-23 MED ORDER — DIPHENHYDRAMINE HCL 50 MG/ML IJ SOLN
12.5000 mg | Freq: Once | INTRAMUSCULAR | Status: AC
Start: 2018-02-24 — End: 2018-02-24
  Administered 2018-02-24: 12.5 mg via INTRAVENOUS
  Filled 2018-02-23: qty 1

## 2018-02-23 MED ORDER — METOCLOPRAMIDE HCL 5 MG/ML IJ SOLN
10.0000 mg | Freq: Once | INTRAMUSCULAR | Status: AC
  Administered 2018-02-24: 10 mg via INTRAVENOUS
  Filled 2018-02-23: qty 2

## 2018-02-23 MED ORDER — ONDANSETRON HCL 4 MG/2ML IJ SOLN
4.0000 mg | Freq: Once | INTRAMUSCULAR | Status: AC
  Administered 2018-02-23: 4 mg via INTRAVENOUS
  Filled 2018-02-23: qty 2

## 2018-02-23 MED ORDER — KETOROLAC TROMETHAMINE 30 MG/ML IJ SOLN
15.0000 mg | Freq: Once | INTRAMUSCULAR | Status: AC
  Administered 2018-02-24: 15 mg via INTRAVENOUS
  Filled 2018-02-23: qty 1

## 2018-02-23 MED ORDER — MORPHINE SULFATE (PF) 4 MG/ML IV SOLN
4.0000 mg | Freq: Once | INTRAVENOUS | Status: AC
Start: 2018-02-23 — End: 2018-02-23
  Administered 2018-02-23: 4 mg via INTRAVENOUS
  Filled 2018-02-23: qty 1

## 2018-02-23 NOTE — ED Triage Notes (Addendum)
Pt fell 2 weeks ago hitting face on concrete, was seen at that time and had ct scan. States since then has had headaches, right ear pain, worse on the right. Today saw neuro and was told to have MRI. Also states she "tastes CSF" and and has a "sense of doom". States she also thinks she has CSF leaking from right nostril. States headache became severe 1 hr Pta.

## 2018-02-23 NOTE — Telephone Encounter (Signed)
Please advise 

## 2018-02-23 NOTE — Telephone Encounter (Signed)
Provider has not completed forms as of yet.

## 2018-02-23 NOTE — ED Provider Notes (Signed)
[facility] Emergency Department Provider Note  ____________________________________________  Time seen: Approximately 10:49 PM  I have reviewed the triage vital signs and the nursing notes.   HISTORY  Chief Complaint Headache   HPI [name] [name] is a 52 y.o. female with a history of chronic migraine headaches, tremors, lupus, fibromyalgia who presents for evaluation of a headache. Patient sustained a fall month ago when she lost her balance and fell on the concrete. She was seen here with negative CT head, face, cervical spine. She reports that since the fall she's been having daily headaches. headaches are located on the right side, throbbing and sharp, mostly constant for the last 4 weeks and nonradiating. She takes Topamax as preventative medication for her migraines and has been taking it as prescribed. She also reports photophobia and nausea but no vomiting. She also reports pain in her neck that has been constant for the last 4 weeks. She reports intermittent right sided runny nose that she reports feels different than a regular runny nose and tastes very salty. This afternoon with the onset of the severe headache she noted an increased drainage from the right nostril. She reports that when she stands up and leans forward that she had significant amount of drainage. patient is concerned that this is CSF. She is also complaining of pain in her right ear for a month.  Past Medical History:  Diagnosis Date  . ADHD   . Anemia   . ANXIETY 06/06/2007  . Arthritis    related to lupus  . Asthma   . BACK PAIN 02/22/2009  . Cervical cancer ([place])   . Cervical cancer ([place])    LEEP 2005   . Chicken pox   . Collagen vascular disease ([place])   . DEPRESSION 06/06/2007  . Essential tremor   . Fibromyalgia   . Fibromyalgia   . FREQUENCY, URINARY 07/07/2007  . Gastritis   . GERD 06/06/2007  . Glaucoma   . Headache    h/o migraines   . History of hiatal hernia     . History of kidney stones   . HYPOTHYROIDISM 06/06/2007  . INTERSTITIAL CYSTITIS 09/11/2010  . Lupus 2006  . Lupus   . Menopause   . Migraine   . Mitral valve regurgitation   . NEPHROLITHIASIS, HX OF 11/05/2010  . Neuropathy   . Neuropathy   . OCD (obsessive compulsive disorder)   . Optic neuritis    2005  . OSTEOPENIA 10/14/2009  . Osteoporosis   . OVERACTIVE BLADDER 10/14/2009  . PAIN, CHRONIC NEC 06/06/2007  . Panniculitis   . POLYARTHRITIS 08/19/2007  . Raynaud's disease   . Sicca syndrome ([place])   . 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 Vaginal prolapse    Dr. Marcelline Mates [facility]   . Vasculitis ([place])   . WEIGHT GAIN 02/22/2009    Patient Active Problem List   Diagnosis Date Noted  . Allergic rhinitis 12/16/2017  . Dizziness 12/16/2017  . Orthostatic hypotension 12/16/2017  . Generalized abdominal pain 12/16/2017  . Constipation 12/16/2017  . Acute nonintractable headache 12/13/2017  . Fibromyalgia 11/04/2017  . Status post hardware removal 02/16/2017  . Sicca syndrome ([place]) 06/04/2014  . Abdominal pain, epigastric 09/08/2013  . Type II or unspecified type diabetes mellitus without mention of complication, uncontrolled 03/15/2013  . UTI (urinary tract infection) 02/15/2013  . Chronic pain 03/31/2011  . NEPHROLITHIASIS, HX OF 11/05/2010  . INTERSTITIAL CYSTITIS 09/11/2010  . Overactive bladder 10/14/2009  . Disorder of bone  and cartilage 10/14/2009  . Backache 02/22/2009  . WEIGHT GAIN 02/22/2009  . 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 POLYARTHRITIS 08/19/2007  . FREQUENCY, URINARY 07/07/2007  . Hypothyroidism 06/06/2007  . Anxiety state 06/06/2007  . Depression, recurrent ([place]) 06/06/2007  . PAIN, CHRONIC NEC 06/06/2007  . GERD 06/06/2007    Past Surgical History:  Procedure Laterality Date  . APPENDECTOMY    . CERVICAL BIOPSY  W/ LOOP ELECTRODE EXCISION  2005  . CHOLECYSTECTOMY    . ESOPHAGOGASTRODUODENOSCOPY (EGD) WITH PROPOFOL N/A 08/27/2017    Procedure: ESOPHAGOGASTRODUODENOSCOPY (EGD) WITH PROPOFOL;  Surgeon: Manya Silvas, MD;  Location: [facility] ENDOSCOPY;  Service: Endoscopy;  Laterality: N/A;  . HAND SURGERY     repair of left metatarsal   . HARDWARE REMOVAL Right 02/16/2017   Procedure: HARDWARE REMOVAL FROM HIP;  Surgeon: Hessie Knows, MD;  Location: [facility] ORS;  Service: Orthopedics;  Laterality: Right;  . JOINT REPLACEMENT    . OTHER SURGICAL HISTORY     left 3rd metatarsal fracture repair 2011   . REVISION TOTAL HIP ARTHROPLASTY  06/2009   replacement 2010 then screw and plate removal 3785; right hip  . TONSILLECTOMY    . TONSILLECTOMY    . wrist  ligament repair bilateral    . WRIST RECONSTRUCTION    . WRIST SURGERY     laceration of right wrist ligaments  . wrist surgery     laceration of left wrist surgery     Prior to Admission medications   Medication Sig Start Date End Date Taking? Authorizing Provider  albuterol (VENTOLIN HFA) 108 (90 Base) MCG/ACT inhaler Ventolin HFA 90 mcg/actuation aerosol inhaler  INHALE 2 PUFFS (180 MCG) BY INHALATION ROUTE EVERY 4 HOURS    [provider]  ARIPiprazole (ABILIFY) 20 MG tablet Take 20 mg by mouth daily.    [provider]  Ascorbic Acid (VITAMIN C) 500 MG CHEW Chew 2,500 mg by mouth daily.    [provider]  aspirin 325 MG tablet Take 1 tablet (325 mg total) by mouth daily. 02/18/17   Duanne Guess, PA-C  Biotin 10 MG CAPS Take by mouth.    [provider]  Biotin 5000 MCG SUBL Place 10,000 mcg under the tongue daily.    [provider]  budesonide-formoterol (SYMBICORT) 160-4.5 MCG/ACT inhaler Symbicort 160 mcg-4.5 mcg/actuation HFA aerosol inhaler    [provider]  calcipotriene (DOVONOX) 0.005 % ointment calcipotriene 0.005 % topical ointment  APPLY A THIN LAYER TO THE AFFECTED AREA(S) BY TOPICAL ROUTE ONCE DAILY ; RUB IN GENTLY AND COMPLETELY    [provider]  Cholecalciferol (VITAMIN D3) 2000  units capsule Vitamin D3 2,000 unit capsule    [provider]  dapsone 25 MG tablet Take by mouth daily.     [provider]  dexmethylphenidate (FOCALIN XR) 20 MG 24 hr capsule dexmethylphenidate ER 20 mg capsule,extended release biphasic50-50    [provider]  diclofenac sodium (VOLTAREN) 1 % GEL Apply 2 g topically 4 (four) times daily as needed (for knee & finger pain.).     [provider]  dicyclomine (BENTYL) 10 MG capsule dicyclomine 10 mg capsule  every 8hours as needed    [provider]  DULoxetine (CYMBALTA) 60 MG capsule duloxetine 60 mg capsule,delayed release  1 twice daily    [provider]  DULoxetine (CYMBALTA) 60 MG capsule Take 60 mg by mouth 2 (two) times daily.    [provider]  EPINEPHrine [facility]  2-PAK) 0.3 mg/0.3 mL IJ SOAJ injection EpiPen 2-Pak 0.3 mg/0.3 mL injection, auto-injector    [provider]  etodolac (LODINE) 500 MG tablet etodolac 500 mg tablet    [provider]  fentaNYL (DURAGESIC - DOSED MCG/HR) 75 MCG/HR Place 75 mcg onto the skin every 3 (three) days.    [provider]  fluocinonide ointment (LIDEX) 0.05 % fluocinonide 0.05 % topical ointment  APPLY TO THE AFFECTED AREA(S) BY TOPICAL ROUTE 2 TIMES PER DAY    [provider]  folic acid (FOLVITE) 1 MG tablet Take 1 mg by mouth daily.    [provider]  hydrocortisone (PROCTOZONE-HC) 2.5 % rectal cream Proctozone-HC 2.5 % topical cream perineal applicator    [provider]  hydroxychloroquine (PLAQUENIL) 200 MG tablet Plaquenil 200 mg tablet  Take 1 tablet twice a day by oral route after meals for 90 days.    [provider]  ibuprofen (ADVIL,MOTRIN) 800 MG tablet Take 1 tablet (800 mg total) by mouth every 8 (eight) hours as needed. 08/04/17   Laban Emperor, PA-C  imiquimod (ALDARA) 5 % cream imiquimod 5 % topical cream packet  APPLY TO THE AFFECTED AREA(S) BY TOPICAL  ROUTE 3 TIMES PER WEEK at bedtime    [provider]  ketorolac (TORADOL) 10 MG tablet Take 1 tablet (10 mg total) every 8 (eight) hours as needed by mouth for moderate pain (with food). 10/11/17   Eula Listen, MD  levothyroxine (SYNTHROID, LEVOTHROID) 25 MCG tablet Take 1 tablet (25 mcg total) by mouth daily before breakfast. 11/11/17   McLean-Scocuzza, Nino Glow, MD  lidocaine (LIDODERM) 5 % Place 1 patch onto the skin every 12 (twelve) hours. Remove & Discard patch within 12 hours or as directed by MD 08/04/17 08/04/18  Laban Emperor, PA-C  LORazepam (ATIVAN) 2 MG tablet Take 1-2 mg by mouth See admin instructions. 1 MG DAILY AS NEEDED FOR ANXIETY & SCHEDULED AT BEDTIME EACH NIGHT    [provider]  methylphenidate 18 MG PO CR tablet Take by mouth.     [provider]  montelukast (SINGULAIR) 10 MG tablet montelukast 10 mg tablet 1 pill qhs 12/13/17   McLean-Scocuzza, Nino Glow, MD  Multiple Vitamins-Minerals (MULTI-VITAMIN GUMMIES PO) Multi Vitamin  GUMMIES    [provider]  ondansetron (ZOFRAN) 4 MG tablet Zofran 8 mg tablet  Take 1 tablet every 8 hours by oral route as needed.    [provider]  oxyCODONE-acetaminophen (PERCOCET/ROXICET) 5-325 MG tablet Take 1-2 tablets by mouth every 4 (four) hours as needed for moderate pain. 02/17/17   Duanne Guess, PA-C  pantoprazole (PROTONIX) 40 MG tablet Take one tablet by mouth twice daily Patient taking differently: TAKE 1 TABLET (40 MG) BY MOUTH ONCE DAILY IN THE MORNING. 12/18/13   Marletta Lor, MD  PARoxetine (PAXIL) 10 MG tablet TAKE 1 TABLET BY MOUTH AT BEDTIME. 08/30/17   Rubie Maid, MD  Polyvinyl Alcohol-Povidone (REFRESH OP) Place 1 drop into both eyes 2 (two) times daily.    [provider]  predniSONE (DELTASONE) 10 MG tablet prednisone 10 mg tablet  1 daily    [provider]  Probiotic Product (PROBIOTIC PO) Probiotic 10 billion cell capsule  daily     [provider]  silver sulfADIAZINE (SILVADENE) 1 % cream silver sulfadiazine 1 % topical cream    [provider]  Sodium Chloride 3 % AERS Saline Nasal Mist 3 %  spray nose deeply every hour or  two and blow nose    [provider]  solifenacin (VESICARE) 5 MG tablet Take 1 tablet (5 mg total) by mouth daily. 12/13/17   McLean-Scocuzza, Nino Glow, MD  sucralfate (CARAFATE) 1 g tablet sucralfate 1 gram tablet    [provider]  topiramate (TOPAMAX) 100 MG tablet topiramate 100 mg tablet  TAKE 1 TABLET BY MOUTH 2 TIMES DAILY.    [provider]  valACYclovir (VALTREX) 500 MG tablet valacyclovir 500 mg tablet  1 daily    [provider]    Allergies Latex; Prasterone; Shellfish-derived products; Bee venom; Compazine [prochlorperazine edisylate]; Dhea [nutritional supplements]; Dilaudid [hydromorphone hcl]; Lactose intolerance (gi); Other; Prochlorperazine; Promethazine; Promethazine hcl; Reclast [zoledronic acid]; Clarithromycin; Fish allergy; Hydromorphone hcl; and Penicillins  Family History  Problem Relation Age of Onset  . Hypertension Mother   . Arthritis Mother   . Heart disease Mother        afib  . Asthma Sister   . Asthma Daughter   . Arthritis Daughter   . Heart disease Daughter        ?heart condition on BB  . ADD / ADHD Daughter   . Pancreatic cancer Father   . Cancer Father        pancreatitic   . Diabetes Maternal Grandmother   . Heart disease Maternal Grandmother   . Arthritis Maternal Grandmother   . Hypertension Maternal Grandmother   . Colon cancer Maternal Uncle 26  . Crohn's disease Maternal Aunt   . Diabetes Maternal Aunt   . Breast cancer Cousin 75       maternal  . Cancer Cousin        m cousin breat cancer s/p removal both breasts     Social History Social History   Tobacco Use  . Smoking status: Never Smoker  . Smokeless tobacco: Never Used  Substance Use Topics  . Alcohol use: No  . Drug  use: No    Review of Systems  Constitutional: Negative for fever. Eyes: Negative for visual changes. ENT: Negative for sore throat. + R nasal discharge and R ear pain Neck: No neck pain  Cardiovascular: Negative for chest pain. Respiratory: Negative for shortness of breath. Gastrointestinal: Negative for abdominal pain, vomiting or diarrhea. Genitourinary: Negative for dysuria. Musculoskeletal: Negative for back pain. Skin: Negative for rash. Neurological: Negative for weakness or numbness. + HA Psych: No SI or HI  ____________________________________________   PHYSICAL EXAM:  VITAL SIGNS: ED Triage Vitals  Enc Vitals Group     BP 02/23/18 2219 (!) 121/52     Pulse Rate 02/23/18 2219 87     Resp 02/23/18 2219 20     Temp 02/23/18 2219 98.1 F (36.7 C)     Temp Source 02/23/18 2219 Oral     SpO2 02/23/18 2219 96 %     Weight 02/23/18 2217 231 lb (104.8 kg)     Height 02/23/18 2217 5\' 10"  (1.778 m)     Head Circumference --      Peak Flow --      Pain Score 02/23/18 2215 10     Pain Loc --      Pain Edu? --      Excl. in [place]? --     Constitutional: Alert and oriented, look uncomfortable but no distress.  HEENT:      Head: Normocephalic and atraumatic.         Eyes: Conjunctivae are normal. Sclera is non-icteric. PERRL. No raccoon eyes  Mouth/Throat: Mucous membranes are moist.       Ear: no hemotympanum bilaterally, no battle sign      Nose: no drainage noted      Neck: Supple with no signs of meningismus. No midline cspine ttp Cardiovascular: Regular rate and rhythm. No murmurs, gallops, or rubs. 2+ symmetrical distal pulses are present in all extremities. No JVD. Respiratory: Normal respiratory effort. Lungs are clear to auscultation bilaterally. No wheezes, crackles, or rhonchi.  Gastrointestinal: Soft, non tender, and non distended with positive bowel sounds. No rebound or guarding. Genitourinary: No CVA tenderness. Musculoskeletal: Nontender with normal  range of motion in all extremities. No edema, cyanosis, or erythema of extremities. Neurologic: Normal speech and language. Tremor which is chronic. Face is symmetric. Intact strength, no pronator drift, or dysmetria. Skin: Skin is warm, dry and intact. No rash noted. Psychiatric: Mood and affect are normal. Speech and behavior are normal.  ____________________________________________   LABS (all labs ordered are listed, but only abnormal results are displayed)  Labs Reviewed - No data to display ____________________________________________  EKG  none  ____________________________________________  RADIOLOGY  CTs: pending ____________________________________________   PROCEDURES  Procedure(s) performed: None Procedures Critical Care performed:  None ____________________________________________   INITIAL IMPRESSION / ASSESSMENT AND PLAN / ED COURSE   52 y.o. female with a history of chronic migraine headaches, tremors, lupus, fibromyalgia who presents for evaluation of a daily headache and R nasal drainage since fall with head trauma 1 month ago. Patient reports that HA became severe 1 hour PTA and nasal drainage increased. patient looks uncomfortable on exam but it's not in any distress, she has normal vital signs, clinically there are no signs of basilar skull fracture with no Battle sign, no hemotympanum, no raccoon eyes, pupils are equal round and reactive, intact extraocular movements, patient is otherwise neurologically intact.there is no nasal drainage even with patient standing up and leaning forward. Patient did undergo CT head, face, C-spine a month ago when she had the injury. She also has a history of chronic headaches for which she sees neurology for it. I will repeat CT head, temporal bones, and cervical spine at this time. We'll treat with morphine and Zofran.    _________________________ 11:44 PM on 02/23/2018 -----------------------------------------  CTs  pending. Care transferred to Dr. Beather Arbour.   As part of my medical decision making, I reviewed the following data within the [place] notes reviewed and incorporated, Notes from prior ED visits and [place] Controlled Substance Database    Pertinent labs & imaging results that were available during my care of the patient were reviewed by me and considered in my medical decision making (see chart for details).    ____________________________________________   FINAL CLINICAL IMPRESSION(S) / ED DIAGNOSES  Final diagnoses:  Acute nonintractable headache, unspecified headache type      NEW MEDICATIONS STARTED DURING THIS VISIT:  ED Discharge Orders    None       Note:  This document was prepared using Dragon voice recognition software and may include unintentional dictation errors.    Alfred Levins, [place], MD 02/23/18 [phone]

## 2018-02-24 MED ORDER — TOPIRAMATE 25 MG PO TABS
150.0000 mg | ORAL_TABLET | Freq: Once | ORAL | Status: AC
  Administered 2018-02-24: 150 mg via ORAL
  Filled 2018-02-24: qty 6

## 2018-02-24 NOTE — Discharge Instructions (Signed)
1.  Take the increased dose of Topamax and muscle relaxer as prescribed by your neurologist. 2.  Your MRI is scheduled for 4/3 at 2:15 PM at [facility] in [place]. 3.  Return to the ER for worsening symptoms, persistent vomiting, difficulty breathing or other concerns.

## 2018-02-24 NOTE — ED Provider Notes (Signed)
-----------------------------------------   12:56 AM on 02/24/2018 -----------------------------------------  Patient is feeling better.  Missed her evening dose of Topamax which I will order before she leaves.  Noted patient has an MRI scheduled but her neurologist was unable to contact her due to a disconnected phone number.  I have relayed her MRI appointment to both patient and her mother.  Dr. Alfred Levins updated patient and her mother on CT imaging results prior to the completion of her shift.  Looks like her neurologist just yesterday increased her evening dose of Topamax and added Tizanidine.  I have encouraged patient to continue this per her neurologist's instructions.  Strict return precautions given.  Patient and mother verbalize understanding and agree with plan of care.   [name] Blanch, MD 02/24/18 [phone]

## 2018-02-25 ENCOUNTER — Emergency Department
Admission: EM | Admit: 2018-02-25 | Discharge: 2018-02-25 | Disposition: A | Discharge status: Home / Self Care | Payer: Medicare Other | Attending: Emergency Medicine | Admitting: Emergency Medicine

## 2018-02-25 ENCOUNTER — Encounter: Payer: Self-pay | Admitting: Internal Medicine

## 2018-02-25 ENCOUNTER — Encounter: Payer: Self-pay | Admitting: Emergency Medicine

## 2018-02-25 ENCOUNTER — Other Ambulatory Visit: Payer: Self-pay

## 2018-02-25 DIAGNOSIS — Z96641 Presence of right artificial hip joint: Secondary | ICD-10-CM | POA: Diagnosis not present

## 2018-02-25 DIAGNOSIS — Z8541 Personal history of malignant neoplasm of cervix uteri: Secondary | ICD-10-CM | POA: Diagnosis not present

## 2018-02-25 DIAGNOSIS — E119 Type 2 diabetes mellitus without complications: Secondary | ICD-10-CM | POA: Insufficient documentation

## 2018-02-25 DIAGNOSIS — E039 Hypothyroidism, unspecified: Secondary | ICD-10-CM | POA: Diagnosis not present

## 2018-02-25 DIAGNOSIS — R51 Headache: Secondary | ICD-10-CM | POA: Diagnosis not present

## 2018-02-25 DIAGNOSIS — Z91040 Latex allergy status: Secondary | ICD-10-CM | POA: Diagnosis not present

## 2018-02-25 DIAGNOSIS — Z0279 Encounter for issue of other medical certificate: Secondary | ICD-10-CM

## 2018-02-25 DIAGNOSIS — Z79899 Other long term (current) drug therapy: Secondary | ICD-10-CM | POA: Insufficient documentation

## 2018-02-25 DIAGNOSIS — Z7982 Long term (current) use of aspirin: Secondary | ICD-10-CM | POA: Diagnosis not present

## 2018-02-25 DIAGNOSIS — R519 Headache, unspecified: Secondary | ICD-10-CM

## 2018-02-25 LAB — CSF CELL COUNT WITH DIFFERENTIAL
EOS CSF: 0 %
EOS CSF: 0 %
Lymphs, CSF: 30 %
Lymphs, CSF: 80 %
MONOCYTE-MACROPHAGE-SPINAL FLUID: 20 %
Monocyte-Macrophage-Spinal Fluid: 10 %
OTHER CELLS CSF: 0
Other Cells, CSF: 0
RBC Count, CSF: 132 /mm3 — ABNORMAL HIGH (ref 0–3)
RBC Count, CSF: 2367 /mm3 — ABNORMAL HIGH (ref 0–3)
SEGMENTED NEUTROPHILS-CSF: 0 %
SEGMENTED NEUTROPHILS-CSF: 60 %
TUBE #: 1
TUBE #: 3
WBC, CSF: 17 /mm3 (ref 0–5)
WBC, CSF: 2 /mm3 (ref 0–5)

## 2018-02-25 LAB — PROTEIN AND GLUCOSE, CSF
Glucose, CSF: 65 mg/dL (ref 40–70)
Total  Protein, CSF: 34 mg/dL (ref 15–45)

## 2018-02-25 MED ORDER — METOCLOPRAMIDE HCL 5 MG/ML IJ SOLN
10.0000 mg | Freq: Once | INTRAMUSCULAR | Status: AC
  Administered 2018-02-25: 10 mg via INTRAVENOUS
  Filled 2018-02-25: qty 2

## 2018-02-25 MED ORDER — SODIUM CHLORIDE 0.9 % IV BOLUS
1000.0000 mL | Freq: Once | INTRAVENOUS | Status: AC
  Administered 2018-02-25: 1000 mL via INTRAVENOUS

## 2018-02-25 MED ORDER — KETOROLAC TROMETHAMINE 30 MG/ML IJ SOLN
30.0000 mg | Freq: Once | INTRAMUSCULAR | Status: AC
  Administered 2018-02-25: 30 mg via INTRAVENOUS
  Filled 2018-02-25: qty 1

## 2018-02-25 MED ORDER — DIPHENHYDRAMINE HCL 50 MG/ML IJ SOLN
50.0000 mg | Freq: Once | INTRAMUSCULAR | Status: AC
  Administered 2018-02-25: 50 mg via INTRAVENOUS
  Filled 2018-02-25: qty 1

## 2018-02-25 MED ORDER — BUTALBITAL-APAP-CAFFEINE 50-325-40 MG PO TABS: 1.0000 | ORAL_TABLET | Freq: Four times a day (QID) | ORAL | 0 refills | Status: AC | PRN

## 2018-02-25 MED ORDER — LORAZEPAM 2 MG/ML IJ SOLN
INTRAMUSCULAR | Status: AC
  Filled 2018-02-25: qty 1

## 2018-02-25 MED ORDER — LORAZEPAM 2 MG/ML IJ SOLN
1.0000 mg | Freq: Once | INTRAMUSCULAR | Status: AC
  Administered 2018-02-25: 1 mg via INTRAVENOUS

## 2018-02-25 MED ORDER — LIDOCAINE HCL (PF) 1 % IJ SOLN
10.0000 mL | Freq: Once | INTRAMUSCULAR | Status: AC
Start: 2018-02-25 — End: 2018-02-25
  Administered 2018-02-25: 10 mL via INTRADERMAL

## 2018-02-25 MED ORDER — METHYLPREDNISOLONE SODIUM SUCC 125 MG IJ SOLR
125.0000 mg | Freq: Once | INTRAMUSCULAR | Status: AC
  Administered 2018-02-25: 125 mg via INTRAVENOUS
  Filled 2018-02-25: qty 2

## 2018-02-25 MED ORDER — BUTALBITAL-APAP-CAFFEINE 50-325-40 MG PO TABS
2.0000 | ORAL_TABLET | Freq: Once | ORAL | Status: AC
  Administered 2018-02-25: 2 via ORAL
  Filled 2018-02-25: qty 2

## 2018-02-25 MED ORDER — LIDOCAINE HCL (PF) 1 % IJ SOLN
INTRAMUSCULAR | Status: AC
  Administered 2018-02-25: 10 mL via INTRADERMAL
  Filled 2018-02-25: qty 10

## 2018-02-25 NOTE — Telephone Encounter (Signed)
Paperwork has been faxed today at 1230

## 2018-02-25 NOTE — ED Provider Notes (Signed)
Examined patient, she is well appearing and in no distress. Likely traumatic tap, elevated RBCs explains wbc in tube 1, tube 3 normal.    Lavonia Drafts, MD 02/25/18 2200

## 2018-02-25 NOTE — ED Triage Notes (Addendum)
Says she fell 2 weeks ago onto face. Has had worst headache ever, blurred vision. It is not getting better .also says that somethimes the fluid comes out of nose.  Says they want to test it, but it stops before she gets here. Says original fall 2 weeks ago was because she tripped and fell at home.

## 2018-02-25 NOTE — ED Notes (Signed)
Dr. Paduchowski at bedside.  

## 2018-02-25 NOTE — Discharge Instructions (Signed)
Please take your medication as needed, as written.  Please follow-up with your neurologist as soon as possible.  Obtain your MRI as scheduled this week.  Return to the emergency department for any weakness, numbness, confusion, slurred speech, fever, or any other symptom personally concerning to yourself.

## 2018-02-25 NOTE — ED Notes (Signed)
Date and time results received: 02/25/18 2130   Test: WBC in CSF in tube 1 Critical Value: 17  Name of Provider Notified: Dr. Corky Downs

## 2018-02-25 NOTE — ED Provider Notes (Signed)
[facility] Emergency Department Provider Note  Time seen: 5:22 PM  I have reviewed the triage vital signs and the nursing notes.   HISTORY  Chief Complaint Headache    HPI [name] [name] is a 52 y.o. female with a past medical history of anxiety, depression, fibromyalgia, gastric reflux, lupus, chronic pain with fentanyl patch, migraines, presents to the emergency department with continued headache.  According to the patient approxi-3 weeks ago she had a fall when she hit the back of her head.  She states since that time she has had a constant moderate to severe headache.  Was seen in the emergency department 2 days ago for the same at that time had a negative CT scan, received medications ultimately felt better and went home.  Patient states she is followed up with her neurologist with whom she sees for chronic migraines, they have ordered an MRI of her Wednesday to further evaluate.  Patient states currently it is a severe dull aching headache.  Denies photo phonophobia, states nausea but denies vomiting.  Denies fever or neck pain.  Largely negative review of systems otherwise.   Past Medical History:  Diagnosis Date  . ADHD   . Anemia   . ANXIETY 06/06/2007  . Arthritis    related to lupus  . Asthma   . BACK PAIN 02/22/2009  . Cervical cancer ([place])   . Cervical cancer ([place])    LEEP 2005   . Chicken pox   . Collagen vascular disease ([place])   . DEPRESSION 06/06/2007  . Essential tremor   . Fibromyalgia   . Fibromyalgia   . FREQUENCY, URINARY 07/07/2007  . Gastritis   . GERD 06/06/2007  . Glaucoma   . Headache    h/o migraines   . History of hiatal hernia   . History of kidney stones   . HYPOTHYROIDISM 06/06/2007  . INTERSTITIAL CYSTITIS 09/11/2010  . Lupus 2006  . Lupus   . Menopause   . Migraine   . Mitral valve regurgitation   . NEPHROLITHIASIS, HX OF 11/05/2010  . Neuropathy   . Neuropathy   . OCD (obsessive compulsive disorder)   . Optic  neuritis    2005  . OSTEOPENIA 10/14/2009  . Osteoporosis   . OVERACTIVE BLADDER 10/14/2009  . PAIN, CHRONIC NEC 06/06/2007  . Panniculitis   . POLYARTHRITIS 08/19/2007  . Raynaud's disease   . Sicca syndrome ([place])   . 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 Vaginal prolapse    Dr. Marcelline Mates [facility]   . Vasculitis ([place])   . WEIGHT GAIN 02/22/2009    Patient Active Problem List   Diagnosis Date Noted  . Allergic rhinitis 12/16/2017  . Dizziness 12/16/2017  . Orthostatic hypotension 12/16/2017  . Generalized abdominal pain 12/16/2017  . Constipation 12/16/2017  . Acute nonintractable headache 12/13/2017  . Fibromyalgia 11/04/2017  . Status post hardware removal 02/16/2017  . Sicca syndrome ([place]) 06/04/2014  . Abdominal pain, epigastric 09/08/2013  . Type II or unspecified type diabetes mellitus without mention of complication, uncontrolled 03/15/2013  . UTI (urinary tract infection) 02/15/2013  . Chronic pain 03/31/2011  . NEPHROLITHIASIS, HX OF 11/05/2010  . INTERSTITIAL CYSTITIS 09/11/2010  . Overactive bladder 10/14/2009  . Disorder of bone and cartilage 10/14/2009  . Backache 02/22/2009  . WEIGHT GAIN 02/22/2009  . 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 POLYARTHRITIS 08/19/2007  . FREQUENCY, URINARY 07/07/2007  . Hypothyroidism 06/06/2007  . Anxiety state 06/06/2007  . Depression, recurrent ([place]) 06/06/2007  .  PAIN, CHRONIC NEC 06/06/2007  . GERD 06/06/2007    Past Surgical History:  Procedure Laterality Date  . APPENDECTOMY    . CERVICAL BIOPSY  W/ LOOP ELECTRODE EXCISION  2005  . CHOLECYSTECTOMY    . ESOPHAGOGASTRODUODENOSCOPY (EGD) WITH PROPOFOL N/A 08/27/2017   Procedure: ESOPHAGOGASTRODUODENOSCOPY (EGD) WITH PROPOFOL;  Surgeon: Manya Silvas, MD;  Location: [facility] ENDOSCOPY;  Service: Endoscopy;  Laterality: N/A;  . HAND SURGERY     repair of left metatarsal   . HARDWARE REMOVAL Right 02/16/2017   Procedure: HARDWARE REMOVAL FROM HIP;  Surgeon: Hessie Knows, MD;  Location: [facility] ORS;  Service: Orthopedics;  Laterality: Right;  . JOINT REPLACEMENT    . OTHER SURGICAL HISTORY     left 3rd metatarsal fracture repair 2011   . REVISION TOTAL HIP ARTHROPLASTY  06/2009   replacement 2010 then screw and plate removal 5852; right hip  . TONSILLECTOMY    . TONSILLECTOMY    . wrist  ligament repair bilateral    . WRIST RECONSTRUCTION    . WRIST SURGERY     laceration of right wrist ligaments  . wrist surgery     laceration of left wrist surgery     Prior to Admission medications   Medication Sig Start Date End Date Taking? Authorizing Provider  albuterol (VENTOLIN HFA) 108 (90 Base) MCG/ACT inhaler Ventolin HFA 90 mcg/actuation aerosol inhaler  INHALE 2 PUFFS (180 MCG) BY INHALATION ROUTE EVERY 4 HOURS    [provider]  ARIPiprazole (ABILIFY) 20 MG tablet Take 20 mg by mouth daily.    [provider]  Ascorbic Acid (VITAMIN C) 500 MG CHEW Chew 2,500 mg by mouth daily.    [provider]  aspirin 325 MG tablet Take 1 tablet (325 mg total) by mouth daily. 02/18/17   Duanne Guess, PA-C  Biotin 10 MG CAPS Take by mouth.    [provider]  Biotin 5000 MCG SUBL Place 10,000 mcg under the tongue daily.    [provider]  budesonide-formoterol (SYMBICORT) 160-4.5 MCG/ACT inhaler Symbicort 160 mcg-4.5 mcg/actuation HFA aerosol inhaler    [provider]  calcipotriene (DOVONOX) 0.005 % ointment calcipotriene 0.005 % topical ointment  APPLY A THIN LAYER TO THE AFFECTED AREA(S) BY TOPICAL ROUTE ONCE DAILY ; RUB IN GENTLY AND COMPLETELY    [provider]  Cholecalciferol (VITAMIN D3) 2000 units capsule Vitamin D3 2,000 unit capsule    [provider]  dapsone 25 MG tablet Take by mouth daily.     [provider]  dexmethylphenidate (FOCALIN XR) 20 MG 24 hr capsule dexmethylphenidate ER 20 mg capsule,extended release biphasic50-50    [provider]  diclofenac  sodium (VOLTAREN) 1 % GEL Apply 2 g topically 4 (four) times daily as needed (for knee & finger pain.).     [provider]  dicyclomine (BENTYL) 10 MG capsule dicyclomine 10 mg capsule  every 8hours as needed    [provider]  DULoxetine (CYMBALTA) 60 MG capsule duloxetine 60 mg capsule,delayed release  1 twice daily    [provider]  DULoxetine (CYMBALTA) 60 MG capsule Take 60 mg by mouth 2 (two) times daily.    [provider]  EPINEPHrine (EPIPEN 2-PAK) 0.3 mg/0.3 mL IJ SOAJ injection EpiPen 2-Pak 0.3 mg/0.3 mL injection, auto-injector    [provider]  etodolac (LODINE) 500 MG tablet etodolac 500 mg tablet    [provider]  fentaNYL (DURAGESIC - DOSED MCG/HR) 75 MCG/HR  Place 75 mcg onto the skin every 3 (three) days.    [provider]  fluocinonide ointment (LIDEX) 0.05 % fluocinonide 0.05 % topical ointment  APPLY TO THE AFFECTED AREA(S) BY TOPICAL ROUTE 2 TIMES PER DAY    [provider]  folic acid (FOLVITE) 1 MG tablet Take 1 mg by mouth daily.    [provider]  hydrocortisone (PROCTOZONE-HC) 2.5 % rectal cream Proctozone-HC 2.5 % topical cream perineal applicator    [provider]  hydroxychloroquine (PLAQUENIL) 200 MG tablet Plaquenil 200 mg tablet  Take 1 tablet twice a day by oral route after meals for 90 days.    [provider]  ibuprofen (ADVIL,MOTRIN) 800 MG tablet Take 1 tablet (800 mg total) by mouth every 8 (eight) hours as needed. 08/04/17   Laban Emperor, PA-C  imiquimod (ALDARA) 5 % cream imiquimod 5 % topical cream packet  APPLY TO THE AFFECTED AREA(S) BY TOPICAL ROUTE 3 TIMES PER WEEK at bedtime    [provider]  ketorolac (TORADOL) 10 MG tablet Take 1 tablet (10 mg total) every 8 (eight) hours as needed by mouth for moderate pain (with food). 10/11/17   Eula Listen, MD  levothyroxine (SYNTHROID, LEVOTHROID) 25 MCG tablet Take 1 tablet (25 mcg  total) by mouth daily before breakfast. 11/11/17   McLean-Scocuzza, Nino Glow, MD  lidocaine (LIDODERM) 5 % Place 1 patch onto the skin every 12 (twelve) hours. Remove & Discard patch within 12 hours or as directed by MD 08/04/17 08/04/18  Laban Emperor, PA-C  LORazepam (ATIVAN) 2 MG tablet Take 1-2 mg by mouth See admin instructions. 1 MG DAILY AS NEEDED FOR ANXIETY & SCHEDULED AT BEDTIME EACH NIGHT    [provider]  methylphenidate 18 MG PO CR tablet Take by mouth.     [provider]  montelukast (SINGULAIR) 10 MG tablet montelukast 10 mg tablet 1 pill qhs 12/13/17   McLean-Scocuzza, Nino Glow, MD  Multiple Vitamins-Minerals (MULTI-VITAMIN GUMMIES PO) Multi Vitamin  GUMMIES    [provider]  ondansetron (ZOFRAN) 4 MG tablet Zofran 8 mg tablet  Take 1 tablet every 8 hours by oral route as needed.    [provider]  oxyCODONE-acetaminophen (PERCOCET/ROXICET) 5-325 MG tablet Take 1-2 tablets by mouth every 4 (four) hours as needed for moderate pain. 02/17/17   Duanne Guess, PA-C  pantoprazole (PROTONIX) 40 MG tablet Take one tablet by mouth twice daily Patient taking differently: TAKE 1 TABLET (40 MG) BY MOUTH ONCE DAILY IN THE MORNING. 12/18/13   Marletta Lor, MD  PARoxetine (PAXIL) 10 MG tablet TAKE 1 TABLET BY MOUTH AT BEDTIME. 08/30/17   Rubie Maid, MD  Polyvinyl Alcohol-Povidone (REFRESH OP) Place 1 drop into both eyes 2 (two) times daily.    [provider]  predniSONE (DELTASONE) 10 MG tablet prednisone 10 mg tablet  1 daily    [provider]  Probiotic Product (PROBIOTIC PO) Probiotic 10 billion cell capsule  daily    [provider]  silver sulfADIAZINE (SILVADENE) 1 % cream silver sulfadiazine 1 % topical cream    [provider]  Sodium Chloride 3 % AERS Saline Nasal Mist 3 %  spray nose deeply every hour or two and blow nose    [provider]  solifenacin (VESICARE) 5 MG tablet Take 1 tablet  (5 mg total) by mouth daily. 12/13/17   McLean-Scocuzza, Nino Glow, MD  sucralfate (CARAFATE) 1 g tablet sucralfate 1 gram tablet  [provider]  topiramate (TOPAMAX) 100 MG tablet topiramate 100 mg tablet  TAKE 1 TABLET BY MOUTH 2 TIMES DAILY.    [provider]  valACYclovir (VALTREX) 500 MG tablet valacyclovir 500 mg tablet  1 daily    [provider]    Allergies  Allergen Reactions  . Latex Anaphylaxis and Rash  . Prasterone Other (See Comments) and Nausea And Vomiting    rash Other reaction(s): Headache Headaches.  . Shellfish-Derived Products Hives, Other (See Comments), Rash and Swelling    Facial swelling Uncoded Allergy. Allergen: seafood Uncoded Allergy. Allergen: CATS, Other Reaction: itch, wheezing Uncoded Allergy. Allergen: COMPAZINE, Other Reaction: tremors Facial swelling Facial swelling Uncoded Allergy. Allergen: seafood Uncoded Allergy. Allergen: CATS, Other Reaction: itch, wheezing Uncoded Allergy. Allergen: COMPAZINE, Other Reaction: tremors Facial swelling Uncoded Allergy. Allergen: seafood Uncoded Allergy. Allergen: CATS, Other Reaction: itch, wheezing Uncoded Allergy. Allergen: COMPAZINE, Other Reaction: tremors Facial swelling  . Bee Venom Itching  . Compazine [Prochlorperazine Edisylate] Other (See Comments)    tremors  . Dhea [Nutritional Supplements] Other (See Comments)    Headaches.  . Dilaudid [Hydromorphone Hcl]     ? reaction  . Lactose Intolerance (Gi)   . Other   . Prochlorperazine     Other reaction(s): Other (See Comments) ticks  . Promethazine     Other reaction(s): Other (See Comments) Ticks  . Promethazine Hcl Other (See Comments)    CNS disorder  . Reclast [Zoledronic Acid]     Weakness could not move limbs, fatigue, increase sleep  . Clarithromycin Hives and Rash  . Fish Allergy Hives, Swelling and Rash    Facial swelling   . Hydromorphone Hcl Rash and Hives    "Rash all over"  . Penicillins  Rash and Other (See Comments)    Has patient had a PCN reaction causing immediate rash, facial/tongue/throat swelling, SOB or lightheadedness with hypotension:No Has patient had a PCN reaction causing severe rash involving mucus membranes or skin necrosis:No Has patient had a PCN reaction that required hospitalization:No Has patient had a PCN reaction occurring within the last 10 years:No If all of the above answers are "NO", then may proceed with Cephalosporin use.    Family History  Problem Relation Age of Onset  . Hypertension Mother   . Arthritis Mother   . Heart disease Mother        afib  . Asthma Sister   . Asthma Daughter   . Arthritis Daughter   . Heart disease Daughter        ?heart condition on BB  . ADD / ADHD Daughter   . Pancreatic cancer Father   . Cancer Father        pancreatitic   . Diabetes Maternal Grandmother   . Heart disease Maternal Grandmother   . Arthritis Maternal Grandmother   . Hypertension Maternal Grandmother   . Colon cancer Maternal Uncle 28  . Crohn's disease Maternal Aunt   . Diabetes Maternal Aunt   . Breast cancer Cousin 48       maternal  . Cancer Cousin        m cousin breat cancer s/p removal both breasts     Social History Social History   Tobacco Use  . Smoking status: Never Smoker  . Smokeless tobacco: Never Used  Substance Use Topics  . Alcohol use: No  . Drug use: No    Review of Systems Constitutional: Negative for fever. Eyes: Negative for visual complaints ENT: Negative for recent  illness/congestion Cardiovascular: Negative for chest pain. Respiratory: Negative for shortness of breath. Gastrointestinal: Negative for abdominal pain, vomiting.  Positive for nausea. Genitourinary: Negative for urinary compaints Musculoskeletal: Negative for musculoskeletal complaints Skin: Negative for skin complaints  Neurological: Moderate to severe headache.  Negative for weakness or numbness of any arm or leg confusion or  slurred speech. All other ROS negative  ____________________________________________   PHYSICAL EXAM:  VITAL SIGNS: ED Triage Vitals  Enc Vitals Group     BP 02/25/18 1328 (!) 142/70     Pulse Rate 02/25/18 1328 100     Resp 02/25/18 1328 16     Temp 02/25/18 1328 98.7 F (37.1 C)     Temp Source 02/25/18 1644 Oral     SpO2 02/25/18 1328 95 %     Weight 02/25/18 1329 231 lb (104.8 kg)     Height 02/25/18 1329 5\' 10"  (1.778 m)     Head Circumference --      Peak Flow --      Pain Score 02/25/18 1329 10     Pain Loc --      Pain Edu? --      Excl. in [place]? --    Constitutional: Alert and oriented. Well appearing and in no distress. Eyes: Normal exam without photophobia. ENT   Head: Normocephalic and atraumatic.   Mouth/Throat: Mucous membranes are moist. Cardiovascular: Normal rate, regular rhythm. No murmur Respiratory: Normal respiratory effort without tachypnea nor retractions. Breath sounds are clear  Gastrointestinal: Soft and nontender. No distention.   Musculoskeletal: Nontender with normal range of motion in all extremities. Neurologic:  Normal speech and language. No gross focal neurologic deficits.  Equal grip strength.  No pronator drift.  5/5 motor in all extremities.  No cranial nerve deficits. Skin:  Skin is warm, dry and intact.  Psychiatric: Mood and affect are normal.   ____________________________________________   INITIAL IMPRESSION / ASSESSMENT AND PLAN / ED COURSE  Pertinent labs & imaging results that were available during my care of the patient were reviewed by me and considered in my medical decision making (see chart for details).  Patient presents to the emergency department for continued headache over the past 2-3 weeks.  States chronic headaches, migraines but states this feels different.  CT had -2 days ago.  Differential includes concussion, closed head injury, migraine.  Overall the patient appears well with an intact neurological exam.   Recent negative workup 2 days ago has follow-up with neurology and an MRI scheduled.  Do not believe further emergent imaging is warranted at this time.  We will have the patient follow-up with neurology for a scheduled MRI.  Given the patient's headache we will treat with medications, IV fluids in the emergency department.  Patient agreeable to this plan of care.  There is minimal to no improvement after medications and Fioricet.  I discussed the patient with Dr. Brigitte Pulse, he recommends trying to obtain an opening pressure with lumbar puncture.  I discussed this with the patient she was agreeable to this plan of care.  Discussed risks and benefits, patient wishes to proceed.  Lumbar puncture performed by myself, 2 attempts, was able to obtain CSF.  Slightly blood-tinged at first and then cleared to clear by tube 4.  Opening pressure of 20.5.  10 mL was removed.  We will have the patient lie flat on her back, CSF pending.  Dr. Brigitte Pulse has an appointment with the patient this week, has an MRI scheduled for the patient  on Wednesday.  If the CSF is normal we will discharge with Fioricet and neurology follow-up.  Patient agreeable.   LUMBAR PUNCTURE  Date/Time: 02/25/2018 at 8:12 PM Performed by: Harvest Dark  Consent: Verbal consent obtained. Written consent obtained. Risks and benefits: risks, benefits and alternatives were discussed Consent given by: Patient Patient understanding: patient states understanding of the procedure being performed  Patient consent: the patient's understanding of the procedure matches consent given  Procedure consent: procedure consent matches procedure scheduled  Relevant documents: relevant documents present and verified  Test results: test results available and properly labeled Site marked: the operative site was marked Imaging studies: imaging studies available  Required items: required blood products, implants, devices, and special equipment available  Patient  identity confirmed: verbally with patient and arm band  Time out: Immediately prior to procedure a "time out" was called to verify the correct patient, procedure, equipment, support staff and site/side marked as required.  Indications: Headache Anesthesia: local infiltration Local anesthetic: lidocaine 1% without epinephrine Anesthetic total: 8 ml Patient sedated: 1mg  Ativan Analgesia: none Preparation: Patient was prepped and draped in the usual sterile fashion. Lumbar space: L3-L4 interspace Patient's position: left lateral decubitus Needle gauge: 22 Needle length: 3.5 in Number of attempts: 1 Opening pressure: 20.5 cm H2O Fluid appearance: pink then cleared to clear by tube4 (traumatic) Tubes of fluid: 4 Total volume: 10 ml Post-procedure: site cleaned and adhesive bandage applied Patient tolerance: Patient tolerated the procedure well with no immediate complications   CSF pending, patient care signed out to oncoming physician. ____________________________________________   FINAL CLINICAL IMPRESSION(S) / ED DIAGNOSES  Headache    Harvest Dark, MD 02/25/18 2013

## 2018-02-25 NOTE — ED Notes (Signed)
Dr Kinner at bedside. 

## 2018-03-01 LAB — CSF CULTURE W GRAM STAIN
Culture: NO GROWTH
Gram Stain: NONE SEEN

## 2018-03-01 LAB — CSF CULTURE

## 2018-03-02 ENCOUNTER — Ambulatory Visit
Admission: RE | Admit: 2018-03-02 | Discharge: 2018-03-02 | Disposition: A | Discharge status: Home / Self Care | Payer: Medicare Other | Source: Ambulatory Visit | Attending: Nurse Practitioner | Admitting: Nurse Practitioner

## 2018-03-02 DIAGNOSIS — G44301 Post-traumatic headache, unspecified, intractable: Secondary | ICD-10-CM | POA: Insufficient documentation

## 2018-03-02 DIAGNOSIS — S0990XA Unspecified injury of head, initial encounter: Secondary | ICD-10-CM | POA: Diagnosis not present

## 2018-03-02 DIAGNOSIS — R51 Headache: Secondary | ICD-10-CM | POA: Diagnosis not present

## 2018-03-02 MED ORDER — GADOBENATE DIMEGLUMINE 529 MG/ML IV SOLN
20.0000 mL | Freq: Once | INTRAVENOUS | Status: AC | PRN
  Administered 2018-03-02: 20 mL via INTRAVENOUS

## 2018-03-03 DIAGNOSIS — F3181 Bipolar II disorder: Secondary | ICD-10-CM | POA: Diagnosis not present

## 2018-03-14 DIAGNOSIS — M545 Low back pain: Secondary | ICD-10-CM | POA: Diagnosis not present

## 2018-03-14 DIAGNOSIS — E669 Obesity, unspecified: Secondary | ICD-10-CM | POA: Diagnosis not present

## 2018-03-14 DIAGNOSIS — M542 Cervicalgia: Secondary | ICD-10-CM | POA: Diagnosis not present

## 2018-03-14 DIAGNOSIS — G44311 Acute post-traumatic headache, intractable: Secondary | ICD-10-CM | POA: Diagnosis not present

## 2018-03-14 DIAGNOSIS — G8929 Other chronic pain: Secondary | ICD-10-CM | POA: Diagnosis not present

## 2018-03-16 DIAGNOSIS — R1013 Epigastric pain: Secondary | ICD-10-CM | POA: Diagnosis not present

## 2018-03-16 DIAGNOSIS — K5900 Constipation, unspecified: Secondary | ICD-10-CM | POA: Diagnosis not present

## 2018-03-16 DIAGNOSIS — R1012 Left upper quadrant pain: Secondary | ICD-10-CM | POA: Diagnosis not present

## 2018-03-16 DIAGNOSIS — G8929 Other chronic pain: Secondary | ICD-10-CM | POA: Diagnosis not present

## 2018-03-17 DIAGNOSIS — G894 Chronic pain syndrome: Secondary | ICD-10-CM | POA: Insufficient documentation

## 2018-03-17 DIAGNOSIS — E119 Type 2 diabetes mellitus without complications: Secondary | ICD-10-CM | POA: Insufficient documentation

## 2018-03-17 DIAGNOSIS — J45909 Unspecified asthma, uncomplicated: Secondary | ICD-10-CM | POA: Insufficient documentation

## 2018-03-17 DIAGNOSIS — M25551 Pain in right hip: Secondary | ICD-10-CM | POA: Diagnosis present

## 2018-03-17 DIAGNOSIS — I10 Essential (primary) hypertension: Secondary | ICD-10-CM | POA: Diagnosis not present

## 2018-03-17 DIAGNOSIS — Z9660 Presence of unspecified orthopedic joint implant: Secondary | ICD-10-CM | POA: Diagnosis not present

## 2018-03-17 DIAGNOSIS — Z79899 Other long term (current) drug therapy: Secondary | ICD-10-CM | POA: Diagnosis not present

## 2018-03-17 DIAGNOSIS — Z91040 Latex allergy status: Secondary | ICD-10-CM | POA: Diagnosis not present

## 2018-03-17 DIAGNOSIS — E039 Hypothyroidism, unspecified: Secondary | ICD-10-CM | POA: Diagnosis not present

## 2018-03-17 DIAGNOSIS — Z8541 Personal history of malignant neoplasm of cervix uteri: Secondary | ICD-10-CM | POA: Diagnosis not present

## 2018-03-17 DIAGNOSIS — M25561 Pain in right knee: Secondary | ICD-10-CM | POA: Diagnosis not present

## 2018-03-17 DIAGNOSIS — M25562 Pain in left knee: Secondary | ICD-10-CM | POA: Diagnosis not present

## 2018-03-17 LAB — COMPREHENSIVE METABOLIC PANEL
ALK PHOS: 81 U/L (ref 38–126)
ALT: 22 U/L (ref 14–54)
AST: 25 U/L (ref 15–41)
Albumin: 4.3 g/dL (ref 3.5–5.0)
Anion gap: 4 — ABNORMAL LOW (ref 5–15)
BILIRUBIN TOTAL: 0.4 mg/dL (ref 0.3–1.2)
BUN: 17 mg/dL (ref 6–20)
CALCIUM: 8.6 mg/dL — AB (ref 8.9–10.3)
CO2: 24 mmol/L (ref 22–32)
Chloride: 114 mmol/L — ABNORMAL HIGH (ref 101–111)
Creatinine, Ser: 0.86 mg/dL (ref 0.44–1.00)
Glucose, Bld: 115 mg/dL — ABNORMAL HIGH (ref 65–99)
Potassium: 3.7 mmol/L (ref 3.5–5.1)
Sodium: 142 mmol/L (ref 135–145)
TOTAL PROTEIN: 6.6 g/dL (ref 6.5–8.1)

## 2018-03-17 LAB — CBC
HCT: 37.3 % (ref 35.0–47.0)
HEMOGLOBIN: 12.3 g/dL (ref 12.0–16.0)
MCH: 29.7 pg (ref 26.0–34.0)
MCHC: 33 g/dL (ref 32.0–36.0)
MCV: 90.1 fL (ref 80.0–100.0)
Platelets: 144 10*3/uL — ABNORMAL LOW (ref 150–440)
RBC: 4.14 MIL/uL (ref 3.80–5.20)
RDW: 15.9 % — ABNORMAL HIGH (ref 11.5–14.5)
WBC: 5.7 10*3/uL (ref 3.6–11.0)

## 2018-03-17 NOTE — ED Triage Notes (Signed)
Patient c/o right hip pain, bilateral knee pain, and bilateral hand pain. Patient reports hx of lupus. Patient reports this feels similar to a lupus flare.

## 2018-03-18 ENCOUNTER — Emergency Department
Admission: EM | Admit: 2018-03-18 | Discharge: 2018-03-18 | Disposition: A | Discharge status: Home / Self Care | Payer: Medicare Other | Attending: Emergency Medicine | Admitting: Emergency Medicine

## 2018-03-18 ENCOUNTER — Encounter: Payer: Self-pay | Admitting: Emergency Medicine

## 2018-03-18 DIAGNOSIS — G894 Chronic pain syndrome: Secondary | ICD-10-CM

## 2018-03-18 HISTORY — DX: Other chronic pain: G89.29

## 2018-03-18 MED ORDER — OXYCODONE-ACETAMINOPHEN 5-325 MG PO TABS
2.0000 | ORAL_TABLET | Freq: Once | ORAL | Status: AC
  Administered 2018-03-18: 2 via ORAL
  Filled 2018-03-18: qty 2

## 2018-03-18 NOTE — Discharge Instructions (Addendum)
As we discussed, your work-up was reassuring today, and I believe that your symptoms are due to the recent change in your chronic pain medication rather than a new or acute issue.  We recommend that you follow-up with your pain management specialist at the next available opportunity as well as with the doctor that helps to manage your lupus.  Continue taking your medications as prescribed in addition to any over-the-counter pain medicine that your doctor as you are able to take.  As per the chronic pain policy in the [facility] system, we cannot prescribe any additional medications for you.  Please follow-up as soon as possible and  return to the emergency department if you develop new or worsening symptoms that concern you.

## 2018-03-18 NOTE — ED Notes (Signed)
Patient reports pain to right hip, bilateral hip and hands. Hx of lupus with similar symptoms.

## 2018-03-18 NOTE — ED Provider Notes (Signed)
[facility] Emergency Department Provider Note  ____________________________________________   First MD Initiated Contact with Patient 03/18/18 [phone]     (approximate)  I have reviewed the triage vital signs and the nursing notes.   HISTORY  Chief Complaint Hip Pain and Knee Pain    HPI [name] [name] is a 52 y.o. female was extensive chronic medical issues and multiple chronic pain syndromes for which she goes to Dr. Roena Malady for pain management.  She presents tonight by private vehicle for evaluation of pain all of her body she states feels similar to prior lupus flares.  She sees a specialist for lupus management who is affiliated with [facility].  She has been weaning the patient's prednisone due to the extensive long-term use and the desire to try to minimize the use of long-term chronic corticosteroids.  More importantly, the patient reports that within the last couple of days Dr. Humphrey Rolls changed her pain management plan from fentanyl patches to oral methadone.  She states that her pain has been gradually increasing since this change and tonight it is severe.  She has been taking Advil in addition to the methadone but nothing particular makes it better or worse.  She reports that she tried calling the pain management doctors office but did not get a return call and did not get in touch with anybody.  She denies fever/chills, chest pain, shortness of breath, nausea, vomiting, and abdominal pain.  Most of the pain is in her knees and her right hand.  She has no swelling.  Past Medical History:  Diagnosis Date  . ADHD   . Anemia   . ANXIETY 06/06/2007  . Arthritis    related to lupus  . Asthma   . BACK PAIN 02/22/2009  . Cervical cancer ([place])   . Cervical cancer ([place])    LEEP 2005   . Chicken pox   . Chronic pain    goes to pain management provider  . Collagen vascular disease ([place])   . DEPRESSION 06/06/2007  . Essential tremor   . Fibromyalgia     . Fibromyalgia   . FREQUENCY, URINARY 07/07/2007  . Gastritis   . GERD 06/06/2007  . Glaucoma   . Headache    h/o migraines   . History of hiatal hernia   . History of kidney stones   . HYPOTHYROIDISM 06/06/2007  . INTERSTITIAL CYSTITIS 09/11/2010  . Lupus ([place]) 2006  . Lupus ([place])   . Menopause   . Migraine   . Mitral valve regurgitation   . NEPHROLITHIASIS, HX OF 11/05/2010  . Neuropathy   . Neuropathy   . OCD (obsessive compulsive disorder)   . Optic neuritis    2005  . OSTEOPENIA 10/14/2009  . Osteoporosis   . OVERACTIVE BLADDER 10/14/2009  . PAIN, CHRONIC NEC 06/06/2007  . Panniculitis   . POLYARTHRITIS 08/19/2007  . Raynaud's disease   . Sicca syndrome ([place])   . 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 Vaginal prolapse    Dr. Marcelline Mates [facility]   . Vasculitis ([place])   . WEIGHT GAIN 02/22/2009    Patient Active Problem List   Diagnosis Date Noted  . Allergic rhinitis 12/16/2017  . Dizziness 12/16/2017  . Orthostatic hypotension 12/16/2017  . Generalized abdominal pain 12/16/2017  . Constipation 12/16/2017  . Acute nonintractable headache 12/13/2017  . Fibromyalgia 11/04/2017  . Status post hardware removal 02/16/2017  . Sicca syndrome ([place]) 06/04/2014  . Abdominal pain, epigastric 09/08/2013  . Type II  or unspecified type diabetes mellitus without mention of complication, uncontrolled 03/15/2013  . UTI (urinary tract infection) 02/15/2013  . Chronic pain 03/31/2011  . NEPHROLITHIASIS, HX OF 11/05/2010  . INTERSTITIAL CYSTITIS 09/11/2010  . Overactive bladder 10/14/2009  . Disorder of bone and cartilage 10/14/2009  . Backache 02/22/2009  . WEIGHT GAIN 02/22/2009  . 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 POLYARTHRITIS 08/19/2007  . FREQUENCY, URINARY 07/07/2007  . Hypothyroidism 06/06/2007  . Anxiety state 06/06/2007  . Depression, recurrent ([place]) 06/06/2007  . PAIN, CHRONIC NEC 06/06/2007  . GERD 06/06/2007    Past Surgical History:  Procedure Laterality  Date  . APPENDECTOMY    . CERVICAL BIOPSY  W/ LOOP ELECTRODE EXCISION  2005  . CHOLECYSTECTOMY    . ESOPHAGOGASTRODUODENOSCOPY (EGD) WITH PROPOFOL N/A 08/27/2017   Procedure: ESOPHAGOGASTRODUODENOSCOPY (EGD) WITH PROPOFOL;  Surgeon: Manya Silvas, MD;  Location: [facility] ENDOSCOPY;  Service: Endoscopy;  Laterality: N/A;  . HAND SURGERY     repair of left metatarsal   . HARDWARE REMOVAL Right 02/16/2017   Procedure: HARDWARE REMOVAL FROM HIP;  Surgeon: Hessie Knows, MD;  Location: [facility] ORS;  Service: Orthopedics;  Laterality: Right;  . JOINT REPLACEMENT    . OTHER SURGICAL HISTORY     left 3rd metatarsal fracture repair 2011   . REVISION TOTAL HIP ARTHROPLASTY  06/2009   replacement 2010 then screw and plate removal 1610; right hip  . TONSILLECTOMY    . TONSILLECTOMY    . wrist  ligament repair bilateral    . WRIST RECONSTRUCTION    . WRIST SURGERY     laceration of right wrist ligaments  . wrist surgery     laceration of left wrist surgery     Prior to Admission medications   Medication Sig Start Date End Date Taking? Authorizing Provider  albuterol (VENTOLIN HFA) 108 (90 Base) MCG/ACT inhaler Ventolin HFA 90 mcg/actuation aerosol inhaler  INHALE 2 PUFFS (180 MCG) BY INHALATION ROUTE EVERY 4 HOURS    [provider]  ARIPiprazole (ABILIFY) 20 MG tablet Take 20 mg by mouth daily.    [provider]  Ascorbic Acid (VITAMIN C) 500 MG CHEW Chew 2,500 mg by mouth daily.    [provider]  aspirin 325 MG tablet Take 1 tablet (325 mg total) by mouth daily. 02/18/17   Duanne Guess, PA-C  Biotin 10 MG CAPS Take by mouth.    [provider]  Biotin 5000 MCG SUBL Place 10,000 mcg under the tongue daily.    [provider]  budesonide-formoterol (SYMBICORT) 160-4.5 MCG/ACT inhaler Symbicort 160 mcg-4.5 mcg/actuation HFA aerosol inhaler    [provider]  butalbital-acetaminophen-caffeine (FIORICET, ESGIC) 50-325-40 MG tablet Take 1-2  tablets by mouth every 6 (six) hours as needed for headache. 02/25/18 02/25/19  Harvest Dark, MD  calcipotriene (DOVONOX) 0.005 % ointment calcipotriene 0.005 % topical ointment  APPLY A THIN LAYER TO THE AFFECTED AREA(S) BY TOPICAL ROUTE ONCE DAILY ; RUB IN GENTLY AND COMPLETELY    [provider]  Cholecalciferol (VITAMIN D3) 2000 units capsule Vitamin D3 2,000 unit capsule    [provider]  dapsone 25 MG tablet Take by mouth daily.     [provider]  dexmethylphenidate (FOCALIN XR) 20 MG 24 hr capsule dexmethylphenidate ER 20 mg capsule,extended release biphasic50-50    [provider]  diclofenac sodium (VOLTAREN) 1 % GEL Apply 2 g topically 4 (four) times daily as needed (for knee & finger pain.).  [provider]  dicyclomine (BENTYL) 10 MG capsule dicyclomine 10 mg capsule  every 8hours as needed    [provider]  DULoxetine (CYMBALTA) 60 MG capsule duloxetine 60 mg capsule,delayed release  1 twice daily    [provider]  DULoxetine (CYMBALTA) 60 MG capsule Take 60 mg by mouth 2 (two) times daily.    [provider]  EPINEPHrine (EPIPEN 2-PAK) 0.3 mg/0.3 mL IJ SOAJ injection EpiPen 2-Pak 0.3 mg/0.3 mL injection, auto-injector    [provider]  etodolac (LODINE) 500 MG tablet etodolac 500 mg tablet    [provider]  fentaNYL (DURAGESIC - DOSED MCG/HR) 75 MCG/HR Place 75 mcg onto the skin every 3 (three) days.    [provider]  fluocinonide ointment (LIDEX) 0.05 % fluocinonide 0.05 % topical ointment  APPLY TO THE AFFECTED AREA(S) BY TOPICAL ROUTE 2 TIMES PER DAY    [provider]  folic acid (FOLVITE) 1 MG tablet Take 1 mg by mouth daily.    [provider]  hydrocortisone (PROCTOZONE-HC) 2.5 % rectal cream Proctozone-HC 2.5 % topical cream perineal applicator    [provider]  hydroxychloroquine (PLAQUENIL) 200 MG tablet Plaquenil 200 mg  tablet  Take 1 tablet twice a day by oral route after meals for 90 days.    [provider]  ibuprofen (ADVIL,MOTRIN) 800 MG tablet Take 1 tablet (800 mg total) by mouth every 8 (eight) hours as needed. 08/04/17   Laban Emperor, PA-C  imiquimod (ALDARA) 5 % cream imiquimod 5 % topical cream packet  APPLY TO THE AFFECTED AREA(S) BY TOPICAL ROUTE 3 TIMES PER WEEK at bedtime    [provider]  ketorolac (TORADOL) 10 MG tablet Take 1 tablet (10 mg total) every 8 (eight) hours as needed by mouth for moderate pain (with food). 10/11/17   Eula Listen, MD  levothyroxine (SYNTHROID, LEVOTHROID) 25 MCG tablet Take 1 tablet (25 mcg total) by mouth daily before breakfast. 11/11/17   McLean-Scocuzza, Nino Glow, MD  lidocaine (LIDODERM) 5 % Place 1 patch onto the skin every 12 (twelve) hours. Remove & Discard patch within 12 hours or as directed by MD 08/04/17 08/04/18  Laban Emperor, PA-C  LORazepam (ATIVAN) 2 MG tablet Take 1-2 mg by mouth See admin instructions. 1 MG DAILY AS NEEDED FOR ANXIETY & SCHEDULED AT BEDTIME EACH NIGHT    [provider]  methylphenidate 18 MG PO CR tablet Take by mouth.     [provider]  montelukast (SINGULAIR) 10 MG tablet montelukast 10 mg tablet 1 pill qhs 12/13/17   McLean-Scocuzza, Nino Glow, MD  Multiple Vitamins-Minerals (MULTI-VITAMIN GUMMIES PO) Multi Vitamin  GUMMIES    [provider]  ondansetron (ZOFRAN) 4 MG tablet Zofran 8 mg tablet  Take 1 tablet every 8 hours by oral route as needed.    [provider]  oxyCODONE-acetaminophen (PERCOCET/ROXICET) 5-325 MG tablet Take 1-2 tablets by mouth every 4 (four) hours as needed for moderate pain. 02/17/17   Duanne Guess, PA-C  pantoprazole (PROTONIX) 40 MG tablet Take one tablet by mouth twice daily Patient taking differently: TAKE 1 TABLET (40 MG) BY MOUTH ONCE DAILY IN THE MORNING. 12/18/13   Marletta Lor, MD  PARoxetine (PAXIL) 10 MG tablet TAKE 1 TABLET  BY MOUTH AT BEDTIME. 08/30/17   Rubie Maid, MD  Polyvinyl Alcohol-Povidone (REFRESH OP) Place 1 drop into both eyes 2 (two) times daily.    [provider]  predniSONE (DELTASONE) 10 MG  tablet prednisone 10 mg tablet  1 daily    [provider]  Probiotic Product (PROBIOTIC PO) Probiotic 10 billion cell capsule  daily    [provider]  silver sulfADIAZINE (SILVADENE) 1 % cream silver sulfadiazine 1 % topical cream    [provider]  Sodium Chloride 3 % AERS Saline Nasal Mist 3 %  spray nose deeply every hour or two and blow nose    [provider]  solifenacin (VESICARE) 5 MG tablet Take 1 tablet (5 mg total) by mouth daily. 12/13/17   McLean-Scocuzza, Nino Glow, MD  sucralfate (CARAFATE) 1 g tablet sucralfate 1 gram tablet    [provider]  topiramate (TOPAMAX) 100 MG tablet topiramate 100 mg tablet  TAKE 1 TABLET BY MOUTH 2 TIMES DAILY.    [provider]  valACYclovir (VALTREX) 500 MG tablet valacyclovir 500 mg tablet  1 daily    [provider]    Allergies Latex; Prasterone; Shellfish-derived products; Bee venom; Compazine [prochlorperazine edisylate]; Dhea [nutritional supplements]; Dilaudid [hydromorphone hcl]; Lactose intolerance (gi); Other; Prochlorperazine; Promethazine; Promethazine hcl; Reclast [zoledronic acid]; Clarithromycin; Fish allergy; Hydromorphone hcl; and Penicillins  Family History  Problem Relation Age of Onset  . Hypertension Mother   . Arthritis Mother   . Heart disease Mother        afib  . Asthma Sister   . Asthma Daughter   . Arthritis Daughter   . Heart disease Daughter        ?heart condition on BB  . ADD / ADHD Daughter   . Pancreatic cancer Father   . Cancer Father        pancreatitic   . Diabetes Maternal Grandmother   . Heart disease Maternal Grandmother   . Arthritis Maternal Grandmother   . Hypertension Maternal Grandmother   . Colon cancer Maternal Uncle 89  .  Crohn's disease Maternal Aunt   . Diabetes Maternal Aunt   . Breast cancer Cousin 58       maternal  . Cancer Cousin        m cousin breat cancer s/p removal both breasts     Social History Social History   Tobacco Use  . Smoking status: Never Smoker  . Smokeless tobacco: Never Used  Substance Use Topics  . Alcohol use: No  . Drug use: No    Review of Systems Constitutional: No fever/chills Eyes: No visual changes. ENT: No sore throat. Cardiovascular: Denies chest pain. Respiratory: Denies shortness of breath. Gastrointestinal: No abdominal pain.  No nausea, no vomiting.  No diarrhea.  No constipation. Genitourinary: Negative for dysuria. Musculoskeletal: Negative for neck pain.  Negative for back pain.  Pain throughout most of her joints that she states is similar to prior lupus flares Integumentary: Negative for rash. Neurological: Negative for headaches, focal weakness or numbness.   ____________________________________________   PHYSICAL EXAM:  VITAL SIGNS: ED Triage Vitals [03/17/18 2217]  Enc Vitals Group     BP (!) 127/59     Pulse Rate 81     Resp 17     Temp 98.3 F (36.8 C)     Temp Source Oral     SpO2 95 %     Weight 104.8 kg (231 lb)     Height      Head Circumference      Peak Flow      Pain Score 10     Pain Loc      Pain Edu?  Excl. in [place]?     Constitutional: Alert and oriented.  Generally well-appearing but does appear uncomfortable and has been crying Eyes: Conjunctivae are injected due to crying Head: Atraumatic. Nose: No congestion/rhinnorhea. Mouth/Throat: Mucous membranes are moist. Neck: No stridor.  No meningeal signs.   Cardiovascular: Normal rate, regular rhythm. Good peripheral circulation. Grossly normal heart sounds. Respiratory: Normal respiratory effort.  No retractions. Lungs CTAB. Gastrointestinal: Soft and nontender. No distention.  Musculoskeletal: No lower extremity tenderness nor edema. No gross deformities of  extremities.  Pain with range of motion throughout her body. Neurologic:  Normal speech and language. No gross focal neurologic deficits are appreciated.  Skin:  Skin is warm, dry and intact. No rash noted. Psychiatric: Mood and affect are sad and upset but generally normal under the circumstances  ____________________________________________   LABS (all labs ordered are listed, but only abnormal results are displayed)  Labs Reviewed  CBC - Abnormal; Notable for the following components:      Result Value   RDW 15.9 (*)    Platelets 144 (*)    All other components within normal limits  COMPREHENSIVE METABOLIC PANEL - Abnormal; Notable for the following components:   Chloride 114 (*)    Glucose, Bld 115 (*)    Calcium 8.6 (*)    Anion gap 4 (*)    All other components within normal limits   ____________________________________________  EKG  None - EKG not ordered by ED physician ____________________________________________  RADIOLOGY   ED MD interpretation: No indication for imaging  Official radiology report(s): No results found.  ____________________________________________   PROCEDURES  Critical Care performed: No   Procedure(s) performed:   Procedures   ____________________________________________   INITIAL IMPRESSION / ASSESSMENT AND PLAN / ED COURSE  As part of my medical decision making, I reviewed the following data within the [facility] notes reviewed and incorporated, Labs reviewed , Old chart reviewed, Notes from prior ED visits and [place] Controlled Substance Database    Differential diagnosis includes, but is not limited to, narcotic withdrawal, lupus flare, unspecified infectious process, pathological fractures, etc.  However given the generalized nature of the pain, her slight tremulousness, and her recent change in her pain management plan, I think she is suffering from narcotic withdrawal.  We discussed this and I  explained the chronic pain policy and how I cannot provide additional prescriptions and I do not recommend changing the plan because clearly her doctor had a specific plan in mind.  She even stated that he said he was trying to wean her onto the methadone from the fentanyl and that it would take time for her body to adjust.  Her lab work is all reassuring and her vital signs are normal today.  There is no evidence of an acute or emergent medical condition.  I do not want to give steroids given that her lupus doctor is in the process of weaning her and there is no indication that steroids would be beneficial.  I gave her 1 dose of Percocet x2 pills for some acute relief but I explained that she needs to take her regular prescription and follow-up as soon as possible with her pain management doctor as well as specialist.  She states that she understands and agrees with the plan.     ____________________________________________  FINAL CLINICAL IMPRESSION(S) / ED DIAGNOSES  Final diagnoses:  Chronic pain syndrome     MEDICATIONS GIVEN DURING THIS VISIT:  Medications  oxyCODONE-acetaminophen (PERCOCET/ROXICET) 5-325 MG  per tablet 2 tablet (2 tablets Oral Given 03/18/18 0247)     ED Discharge Orders    None       Note:  This document was prepared using Dragon voice recognition software and may include unintentional dictation errors.    Hinda Kehr, MD 03/18/18 [phone]

## 2018-03-23 DIAGNOSIS — S8001XA Contusion of right knee, initial encounter: Secondary | ICD-10-CM | POA: Diagnosis not present

## 2018-03-23 DIAGNOSIS — M25562 Pain in left knee: Secondary | ICD-10-CM | POA: Diagnosis not present

## 2018-03-23 DIAGNOSIS — M25561 Pain in right knee: Secondary | ICD-10-CM | POA: Diagnosis not present

## 2018-03-23 DIAGNOSIS — S8002XA Contusion of left knee, initial encounter: Secondary | ICD-10-CM | POA: Diagnosis not present

## 2018-03-25 ENCOUNTER — Other Ambulatory Visit: Payer: Self-pay | Admitting: Student

## 2018-03-25 DIAGNOSIS — K760 Fatty (change of) liver, not elsewhere classified: Secondary | ICD-10-CM

## 2018-03-25 DIAGNOSIS — R1013 Epigastric pain: Secondary | ICD-10-CM

## 2018-03-25 DIAGNOSIS — R110 Nausea: Secondary | ICD-10-CM

## 2018-03-25 DIAGNOSIS — R1012 Left upper quadrant pain: Secondary | ICD-10-CM

## 2018-03-28 DIAGNOSIS — F3181 Bipolar II disorder: Secondary | ICD-10-CM | POA: Diagnosis not present

## 2018-03-29 ENCOUNTER — Emergency Department
Admission: EM | Admit: 2018-03-29 | Discharge: 2018-03-29 | Disposition: A | Discharge status: Home / Self Care | Payer: Medicare Other | Attending: Emergency Medicine | Admitting: Emergency Medicine

## 2018-03-29 ENCOUNTER — Ambulatory Visit: Payer: Self-pay | Admitting: *Deleted

## 2018-03-29 ENCOUNTER — Emergency Department: Payer: Medicare Other

## 2018-03-29 ENCOUNTER — Encounter: Payer: Self-pay | Admitting: Emergency Medicine

## 2018-03-29 ENCOUNTER — Other Ambulatory Visit: Payer: Self-pay

## 2018-03-29 DIAGNOSIS — Z96641 Presence of right artificial hip joint: Secondary | ICD-10-CM | POA: Diagnosis not present

## 2018-03-29 DIAGNOSIS — K5900 Constipation, unspecified: Secondary | ICD-10-CM | POA: Insufficient documentation

## 2018-03-29 DIAGNOSIS — R1084 Generalized abdominal pain: Secondary | ICD-10-CM

## 2018-03-29 DIAGNOSIS — R197 Diarrhea, unspecified: Secondary | ICD-10-CM | POA: Diagnosis not present

## 2018-03-29 DIAGNOSIS — R109 Unspecified abdominal pain: Secondary | ICD-10-CM | POA: Diagnosis not present

## 2018-03-29 DIAGNOSIS — E039 Hypothyroidism, unspecified: Secondary | ICD-10-CM | POA: Insufficient documentation

## 2018-03-29 DIAGNOSIS — J45909 Unspecified asthma, uncomplicated: Secondary | ICD-10-CM | POA: Insufficient documentation

## 2018-03-29 DIAGNOSIS — Z91040 Latex allergy status: Secondary | ICD-10-CM | POA: Diagnosis not present

## 2018-03-29 DIAGNOSIS — Z79899 Other long term (current) drug therapy: Secondary | ICD-10-CM | POA: Diagnosis not present

## 2018-03-29 DIAGNOSIS — E119 Type 2 diabetes mellitus without complications: Secondary | ICD-10-CM | POA: Insufficient documentation

## 2018-03-29 DIAGNOSIS — Z7982 Long term (current) use of aspirin: Secondary | ICD-10-CM | POA: Insufficient documentation

## 2018-03-29 LAB — COMPREHENSIVE METABOLIC PANEL
ALK PHOS: 82 U/L (ref 38–126)
ALT: 24 U/L (ref 14–54)
ANION GAP: 5 (ref 5–15)
AST: 27 U/L (ref 15–41)
Albumin: 4.2 g/dL (ref 3.5–5.0)
BILIRUBIN TOTAL: 0.5 mg/dL (ref 0.3–1.2)
BUN: 18 mg/dL (ref 6–20)
CALCIUM: 8.7 mg/dL — AB (ref 8.9–10.3)
CO2: 23 mmol/L (ref 22–32)
CREATININE: 0.91 mg/dL (ref 0.44–1.00)
Chloride: 113 mmol/L — ABNORMAL HIGH (ref 101–111)
GFR calc non Af Amer: >60 mL/min (ref >60)
Glucose, Bld: 98 mg/dL (ref 65–99)
Potassium: 4 mmol/L (ref 3.5–5.1)
SODIUM: 141 mmol/L (ref 135–145)
TOTAL PROTEIN: 6.9 g/dL (ref 6.5–8.1)

## 2018-03-29 LAB — CBC
HCT: 38.6 % (ref 35.0–47.0)
Hemoglobin: 12.6 g/dL (ref 12.0–16.0)
MCH: 30 pg (ref 26.0–34.0)
MCHC: 32.7 g/dL (ref 32.0–36.0)
MCV: 91.7 fL (ref 80.0–100.0)
PLATELETS: 145 10*3/uL — AB (ref 150–440)
RBC: 4.21 MIL/uL (ref 3.80–5.20)
RDW: 15.5 % — ABNORMAL HIGH (ref 11.5–14.5)
WBC: 5.1 10*3/uL (ref 3.6–11.0)

## 2018-03-29 LAB — URINALYSIS, COMPLETE (UACMP) WITH MICROSCOPIC
BILIRUBIN URINE: NEGATIVE
Bacteria, UA: NONE SEEN
Glucose, UA: NEGATIVE mg/dL
HGB URINE DIPSTICK: NEGATIVE
KETONES UR: NEGATIVE mg/dL
Leukocytes, UA: NEGATIVE
NITRITE: NEGATIVE
PH: 7 (ref 5.0–8.0)
Protein, ur: NEGATIVE mg/dL
SPECIFIC GRAVITY, URINE: 1.012 (ref 1.005–1.030)

## 2018-03-29 LAB — LIPASE, BLOOD: Lipase: 24 U/L (ref 11–51)

## 2018-03-29 MED ORDER — IOPAMIDOL (ISOVUE-300) INJECTION 61%
30.0000 mL | Freq: Once | INTRAVENOUS | Status: AC | PRN
  Administered 2018-03-29: 30 mL via ORAL

## 2018-03-29 MED ORDER — ONDANSETRON HCL 4 MG/2ML IJ SOLN
4.0000 mg | Freq: Once | INTRAMUSCULAR | Status: AC
  Administered 2018-03-29: 4 mg via INTRAVENOUS
  Filled 2018-03-29: qty 2

## 2018-03-29 MED ORDER — KETOROLAC TROMETHAMINE 30 MG/ML IJ SOLN
30.0000 mg | Freq: Once | INTRAMUSCULAR | Status: AC
  Administered 2018-03-29: 30 mg via INTRAVENOUS
  Filled 2018-03-29: qty 1

## 2018-03-29 MED ORDER — POLYETHYLENE GLYCOL 3350 17 GM/SCOOP PO POWD: 17.0000 g | Freq: Every day | ORAL | 0 refills | Status: DC

## 2018-03-29 MED ORDER — IOPAMIDOL (ISOVUE-370) INJECTION 76%
75.0000 mL | Freq: Once | INTRAVENOUS | Status: AC | PRN
  Administered 2018-03-29: 75 mL via INTRAVENOUS

## 2018-03-29 MED ORDER — SODIUM CHLORIDE 0.9 % IV BOLUS
1000.0000 mL | Freq: Once | INTRAVENOUS | Status: AC
Start: 2018-03-29 — End: 2018-03-29
  Administered 2018-03-29: 1000 mL via INTRAVENOUS

## 2018-03-29 NOTE — ED Notes (Signed)
Patient transported to CT 

## 2018-03-29 NOTE — ED Notes (Signed)
ED Provider at bedside. 

## 2018-03-29 NOTE — ED Notes (Addendum)
See Triage Note - pt reports "spasmotic" and "stabbing" pain in lower abdomen. States "it feels like my guts are gonna fall out the bottom of me."

## 2018-03-29 NOTE — ED Notes (Signed)
Patient verbalized understanding of discharge instructions and follow-up care. NAD noted. To lobby in wheelchair.

## 2018-03-29 NOTE — ED Notes (Addendum)
Pt has chronic pain with her lupus. Wearing 143mcg fentanyl patch to left chest.  Describes abdominal pain as similar pain to miscarrage.

## 2018-03-29 NOTE — ED Triage Notes (Signed)
Pt with abd pain, bloody diarrhea, dizziness, and nausea that started this morning when she woke up.  States took a shower and while washing "felt like anus felt like a donut". Hx of lupus and reflux.  Called her GI doctor this morning who did not have room for her and told her to come to ED.  Chest rise even and unlabored, skin warm and dry, denies blood clots.

## 2018-03-29 NOTE — Telephone Encounter (Signed)
Pt called with feeling her rectum coming out of her anus. She said that she was having abd pain and then went to the bathroom and had a bloody stool. Then she said that she felt a "donut". I asked her if it could be a hemorrhoid and she said no, she has had hemorrhoids before and knows what they feel like. NO rash. She also said that she is feeling lightheaded and has a fast heart beat. Her pulse was around a 100 when she checked it. She is in her car right now. Advised her to go to the closest  emergency department, which is [facility].  Pt voiced understanding and will notify flow at [facility].  Reason for Disposition . Large mass protruding out of rectum  Answer Assessment - Initial Assessment Questions 1. SYMPTOM:  "What's the main symptom you're concerned about?" (e.g., pain, itching, swelling, rash)     Rectal pain 2. ONSET: "When did the ________  start?"     This morning 3. RECTAL PAIN: "Do you have any pain around your rectum?" "How bad is the pain?"  (Scale 1-10; or mild, moderate, severe)  - MILD (1-3): doesn't interfere with normal activities   - MODERATE (4-7): interferes with normal activities or awakens from sleep, limping   - SEVERE (8-10): excruciating pain, unable to have a bowel movement      Pain #6 and #9 in lower abd pain 4. RECTAL ITCHING: "Do you have any itching in this area?" "How bad is the itching?"  (Scale 1-10; or mild, moderate, severe)  - MILD - doesn't interfere with normal activities   - MODERATE-SEVERE: interferes with normal activities or awakens from sleep     no 5. CONSTIPATION: "Do you have constipation?" If so, "How bad is it?"     No, but pushing to have a bm 6. CAUSE: "What do you think is causing the anus symptoms?"     prolapse rectum? 7. OTHER SYMPTOMS: "Do you have any other symptoms?"  (e.g., rectal bleeding, abdominal pain, vomiting, fever)     Bloody stool, abdominal pain, nausea 8. PREGNANCY: "Is there any chance you are  pregnant?" "When was your last menstrual period?"     No, lmp 4 years ago  Protocols used: RECTAL [facility]

## 2018-03-29 NOTE — ED Notes (Signed)
First Nurse Note: Pt sent here by PMD for a possible prolapsed rectum and abd pain. Pt ambulated to desk with slow steady gait. She is able to sit in chair without difficulty.

## 2018-03-29 NOTE — Telephone Encounter (Signed)
Patient is checking in at ED.

## 2018-03-29 NOTE — ED Provider Notes (Signed)
[facility] Emergency Department Provider Note  Time seen: 4:00 PM  I have reviewed the triage vital signs and the nursing notes.   HISTORY  Chief Complaint Abdominal Pain    HPI [name] [name] is a 52 y.o. female with a past medical history of lupus, chronic pain, fibromyalgia, depression, presents to the emergency department for diffuse abdominal pain and rectal bleeding.  According to the patient early this morning she developed fairly diffuse abdominal pain but more so across the lower abdomen.  States she had a feeling several times like she needed to have diarrhea when she went to use the bathroom she is not able to produce any stool.  Eventually the patient states she was able to produce one stool but it was mixed with blood which concerned the patient.  She called her GI doctor but they were unable to see her until June so they recommended she come to the emergency department.  Patient also states when she was wiping she felt like her anus was very distended, does state a history of hemorrhoids.  States nausea has not been able to eat or drink much today due to the nausea but denies any vomiting.  No urinary symptoms.  Largely negative review of systems otherwise.   Past Medical History:  Diagnosis Date  . ADHD   . Anemia   . ANXIETY 06/06/2007  . Arthritis    related to lupus  . Asthma   . BACK PAIN 02/22/2009  . Cervical cancer ([place])   . Cervical cancer ([place])    LEEP 2005   . Chicken pox   . Chronic pain    goes to pain management provider  . Collagen vascular disease ([place])   . DEPRESSION 06/06/2007  . Essential tremor   . Fibromyalgia   . Fibromyalgia   . FREQUENCY, URINARY 07/07/2007  . Gastritis   . GERD 06/06/2007  . Glaucoma   . Headache    h/o migraines   . History of hiatal hernia   . History of kidney stones   . HYPOTHYROIDISM 06/06/2007  . INTERSTITIAL CYSTITIS 09/11/2010  . Lupus ([place]) 2006  . Lupus ([place])   . Menopause   .  Migraine   . Mitral valve regurgitation   . NEPHROLITHIASIS, HX OF 11/05/2010  . Neuropathy   . Neuropathy   . OCD (obsessive compulsive disorder)   . Optic neuritis    2005  . OSTEOPENIA 10/14/2009  . Osteoporosis   . OVERACTIVE BLADDER 10/14/2009  . PAIN, CHRONIC NEC 06/06/2007  . Panniculitis   . POLYARTHRITIS 08/19/2007  . Raynaud's disease   . Sicca syndrome ([place])   . 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 Vaginal prolapse    Dr. Marcelline Mates [facility]   . Vasculitis ([place])   . WEIGHT GAIN 02/22/2009    Patient Active Problem List   Diagnosis Date Noted  . Allergic rhinitis 12/16/2017  . Dizziness 12/16/2017  . Orthostatic hypotension 12/16/2017  . Generalized abdominal pain 12/16/2017  . Constipation 12/16/2017  . Acute nonintractable headache 12/13/2017  . Fibromyalgia 11/04/2017  . Status post hardware removal 02/16/2017  . Sicca syndrome ([place]) 06/04/2014  . Abdominal pain, epigastric 09/08/2013  . Type II or unspecified type diabetes mellitus without mention of complication, uncontrolled 03/15/2013  . UTI (urinary tract infection) 02/15/2013  . Chronic pain 03/31/2011  . NEPHROLITHIASIS, HX OF 11/05/2010  . INTERSTITIAL CYSTITIS 09/11/2010  . Overactive bladder 10/14/2009  . Disorder of bone and cartilage 10/14/2009  . Backache  02/22/2009  . WEIGHT GAIN 02/22/2009  . 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 POLYARTHRITIS 08/19/2007  . FREQUENCY, URINARY 07/07/2007  . Hypothyroidism 06/06/2007  . Anxiety state 06/06/2007  . Depression, recurrent ([place]) 06/06/2007  . PAIN, CHRONIC NEC 06/06/2007  . GERD 06/06/2007    Past Surgical History:  Procedure Laterality Date  . APPENDECTOMY    . CERVICAL BIOPSY  W/ LOOP ELECTRODE EXCISION  2005  . CHOLECYSTECTOMY    . ESOPHAGOGASTRODUODENOSCOPY (EGD) WITH PROPOFOL N/A 08/27/2017   Procedure: ESOPHAGOGASTRODUODENOSCOPY (EGD) WITH PROPOFOL;  Surgeon: Manya Silvas, MD;  Location:  [facility] ENDOSCOPY;  Service: Endoscopy;   Laterality: N/A;  . HAND SURGERY     repair of left metatarsal   . HARDWARE REMOVAL Right 02/16/2017   Procedure: HARDWARE REMOVAL FROM HIP;  Surgeon: Hessie Knows, MD;  Location: [facility] ORS;  Service: Orthopedics;  Laterality: Right;  . JOINT REPLACEMENT    . OTHER SURGICAL HISTORY     left 3rd metatarsal fracture repair 2011   . REVISION TOTAL HIP ARTHROPLASTY  06/2009   replacement 2010 then screw and plate removal 1660; right hip  . TONSILLECTOMY    . TONSILLECTOMY    . wrist  ligament repair bilateral    . WRIST RECONSTRUCTION    . WRIST SURGERY     laceration of right wrist ligaments  . wrist surgery     laceration of left wrist surgery     Prior to Admission medications   Medication Sig Start Date End Date Taking? Authorizing Provider  albuterol (VENTOLIN HFA) 108 (90 Base) MCG/ACT inhaler Ventolin HFA 90 mcg/actuation aerosol inhaler  INHALE 2 PUFFS (180 MCG) BY INHALATION ROUTE EVERY 4 HOURS    [provider]  ARIPiprazole (ABILIFY) 20 MG tablet Take 20 mg by mouth daily.    [provider]  Ascorbic Acid (VITAMIN C) 500 MG CHEW Chew 2,500 mg by mouth daily.    [provider]  aspirin 325 MG tablet Take 1 tablet (325 mg total) by mouth daily. 02/18/17   Duanne Guess, PA-C  Biotin 10 MG CAPS Take by mouth.    [provider]  Biotin 5000 MCG SUBL Place 10,000 mcg under the tongue daily.    [provider]  budesonide-formoterol (SYMBICORT) 160-4.5 MCG/ACT inhaler Symbicort 160 mcg-4.5 mcg/actuation HFA aerosol inhaler    [provider]  butalbital-acetaminophen-caffeine (FIORICET, ESGIC) 50-325-40 MG tablet Take 1-2 tablets by mouth every 6 (six) hours as needed for headache. 02/25/18 02/25/19  Harvest Dark, MD  calcipotriene (DOVONOX) 0.005 % ointment calcipotriene 0.005 % topical ointment  APPLY A THIN LAYER TO THE AFFECTED AREA(S) BY TOPICAL ROUTE ONCE DAILY ; RUB IN GENTLY AND COMPLETELY    [provider]  Cholecalciferol (VITAMIN D3) 2000 units capsule Vitamin D3 2,000 unit capsule    [provider]  dapsone 25 MG tablet Take by mouth daily.     [provider]  dexmethylphenidate (FOCALIN XR) 20 MG 24 hr capsule dexmethylphenidate ER 20 mg capsule,extended release biphasic50-50    [provider]  diclofenac sodium (VOLTAREN) 1 % GEL Apply 2 g topically 4 (four) times daily as needed (for knee & finger pain.).     [provider]  dicyclomine (BENTYL) 10 MG capsule dicyclomine 10 mg capsule  every 8hours as needed    [provider]  DULoxetine (CYMBALTA) 60 MG capsule duloxetine 60 mg capsule,delayed release  1 twice daily    [provider]  DULoxetine (CYMBALTA) 60  MG capsule Take 60 mg by mouth 2 (two) times daily.    [provider]  EPINEPHrine (EPIPEN 2-PAK) 0.3 mg/0.3 mL IJ SOAJ injection EpiPen 2-Pak 0.3 mg/0.3 mL injection, auto-injector    [provider]  etodolac (LODINE) 500 MG tablet etodolac 500 mg tablet    [provider]  fentaNYL (DURAGESIC - DOSED MCG/HR) 75 MCG/HR Place 75 mcg onto the skin every 3 (three) days.    [provider]  fluocinonide ointment (LIDEX) 0.05 % fluocinonide 0.05 % topical ointment  APPLY TO THE AFFECTED AREA(S) BY TOPICAL ROUTE 2 TIMES PER DAY    [provider]  folic acid (FOLVITE) 1 MG tablet Take 1 mg by mouth daily.    [provider]  hydrocortisone (PROCTOZONE-HC) 2.5 % rectal cream Proctozone-HC 2.5 % topical cream perineal applicator    [provider]  hydroxychloroquine (PLAQUENIL) 200 MG tablet Plaquenil 200 mg tablet  Take 1 tablet twice a day by oral route after meals for 90 days.    [provider]  ibuprofen (ADVIL,MOTRIN) 800 MG tablet Take 1 tablet (800 mg total) by mouth every 8 (eight) hours as needed. 08/04/17   Laban Emperor, PA-C  imiquimod (ALDARA) 5 % cream imiquimod 5 % topical cream packet  APPLY  TO THE AFFECTED AREA(S) BY TOPICAL ROUTE 3 TIMES PER WEEK at bedtime    [provider]  ketorolac (TORADOL) 10 MG tablet Take 1 tablet (10 mg total) every 8 (eight) hours as needed by mouth for moderate pain (with food). 10/11/17   Eula Listen, MD  levothyroxine (SYNTHROID, LEVOTHROID) 25 MCG tablet Take 1 tablet (25 mcg total) by mouth daily before breakfast. 11/11/17   McLean-Scocuzza, Nino Glow, MD  lidocaine (LIDODERM) 5 % Place 1 patch onto the skin every 12 (twelve) hours. Remove & Discard patch within 12 hours or as directed by MD 08/04/17 08/04/18  Laban Emperor, PA-C  LORazepam (ATIVAN) 2 MG tablet Take 1-2 mg by mouth See admin instructions. 1 MG DAILY AS NEEDED FOR ANXIETY & SCHEDULED AT BEDTIME EACH NIGHT    [provider]  methylphenidate 18 MG PO CR tablet Take by mouth.     [provider]  montelukast (SINGULAIR) 10 MG tablet montelukast 10 mg tablet 1 pill qhs 12/13/17   McLean-Scocuzza, Nino Glow, MD  Multiple Vitamins-Minerals (MULTI-VITAMIN GUMMIES PO) Multi Vitamin  GUMMIES    [provider]  ondansetron (ZOFRAN) 4 MG tablet Zofran 8 mg tablet  Take 1 tablet every 8 hours by oral route as needed.    [provider]  oxyCODONE-acetaminophen (PERCOCET/ROXICET) 5-325 MG tablet Take 1-2 tablets by mouth every 4 (four) hours as needed for moderate pain. 02/17/17   Duanne Guess, PA-C  pantoprazole (PROTONIX) 40 MG tablet Take one tablet by mouth twice daily Patient taking differently: TAKE 1 TABLET (40 MG) BY MOUTH ONCE DAILY IN THE MORNING. 12/18/13   Marletta Lor, MD  PARoxetine (PAXIL) 10 MG tablet TAKE 1 TABLET BY MOUTH AT BEDTIME. 08/30/17   Rubie Maid, MD  Polyvinyl Alcohol-Povidone (REFRESH OP) Place 1 drop into both eyes 2 (two) times daily.    [provider]  predniSONE (DELTASONE) 10 MG tablet prednisone 10 mg tablet  1 daily    [provider]  Probiotic Product (PROBIOTIC PO) Probiotic 10  billion cell capsule  daily    [provider]  silver sulfADIAZINE (SILVADENE) 1 % cream silver sulfadiazine 1 % topical cream    [provider]  Sodium Chloride 3 % AERS Saline Nasal Mist 3 %  spray nose deeply every hour or two and blow nose    [provider]  solifenacin (VESICARE) 5 MG tablet Take 1 tablet (5 mg total) by mouth daily. 12/13/17   McLean-Scocuzza, Nino Glow, MD  sucralfate (CARAFATE) 1 g tablet sucralfate 1 gram tablet    [provider]  topiramate (TOPAMAX) 100 MG tablet topiramate 100 mg tablet  TAKE 1 TABLET BY MOUTH 2 TIMES DAILY.    [provider]  valACYclovir (VALTREX) 500 MG tablet valacyclovir 500 mg tablet  1 daily    [provider]    Allergies  Allergen Reactions  . Latex Anaphylaxis and Rash  . Prasterone Other (See Comments) and Nausea And Vomiting    rash Other reaction(s): Headache Headaches.  . Shellfish-Derived Products Hives, Other (See Comments), Rash and Swelling    Facial swelling Uncoded Allergy. Allergen: seafood Uncoded Allergy. Allergen: CATS, Other Reaction: itch, wheezing Uncoded Allergy. Allergen: COMPAZINE, Other Reaction: tremors Facial swelling Facial swelling Uncoded Allergy. Allergen: seafood Uncoded Allergy. Allergen: CATS, Other Reaction: itch, wheezing Uncoded Allergy. Allergen: COMPAZINE, Other Reaction: tremors Facial swelling Uncoded Allergy. Allergen: seafood Uncoded Allergy. Allergen: CATS, Other Reaction: itch, wheezing Uncoded Allergy. Allergen: COMPAZINE, Other Reaction: tremors Facial swelling  . Bee Venom Itching  . Compazine [Prochlorperazine Edisylate] Other (See Comments)    tremors  . Dhea [Nutritional Supplements] Other (See Comments)    Headaches.  . Dilaudid [Hydromorphone Hcl]     ? reaction  . Lactose Intolerance (Gi)   . Other   . Prochlorperazine     Other reaction(s): Other (See Comments) ticks  . Promethazine     Other reaction(s):  Other (See Comments) Ticks  . Promethazine Hcl Other (See Comments)    CNS disorder  . Reclast [Zoledronic Acid]     Weakness could not move limbs, fatigue, increase sleep  . Clarithromycin Hives and Rash  . Fish Allergy Hives, Swelling and Rash    Facial swelling   . Hydromorphone Hcl Rash and Hives    "Rash all over"  . Penicillins Rash and Other (See Comments)    Has patient had a PCN reaction causing immediate rash, facial/tongue/throat swelling, SOB or lightheadedness with hypotension:No Has patient had a PCN reaction causing severe rash involving mucus membranes or skin necrosis:No Has patient had a PCN reaction that required hospitalization:No Has patient had a PCN reaction occurring within the last 10 years:No If all of the above answers are "NO", then may proceed with Cephalosporin use.    Family History  Problem Relation Age of Onset  . Hypertension Mother   . Arthritis Mother   . Heart disease Mother        afib  . Asthma Sister   . Asthma Daughter   . Arthritis Daughter   . Heart disease Daughter        ?heart condition on BB  . ADD / ADHD Daughter   . Pancreatic cancer Father   . Cancer Father        pancreatitic   . Diabetes Maternal Grandmother   . Heart disease Maternal Grandmother   . Arthritis Maternal Grandmother   . Hypertension Maternal Grandmother   . Colon cancer Maternal Uncle 9  . Crohn's disease Maternal Aunt   . Diabetes Maternal Aunt   . Breast cancer Cousin 30       maternal  . Cancer Cousin  m cousin breat cancer s/p removal both breasts     Social History Social History   Tobacco Use  . Smoking status: Never Smoker  . Smokeless tobacco: Never Used  Substance Use Topics  . Alcohol use: No  . Drug use: No    Review of Systems Constitutional: Negative for fever. Eyes: Negative for visual complaints ENT: Negative for recent illness/congestion Cardiovascular: Negative for chest pain. Respiratory: Negative for shortness  of breath. Gastrointestinal: Diffuse abdominal pain although more so in the lower abdomen dull pain.  Positive for rectal bleeding per patient. Genitourinary: Negative for urinary compaints Musculoskeletal: Negative for musculoskeletal complaints Skin: Negative for skin complaints  Neurological: Negative for headache All other ROS negative  ____________________________________________   PHYSICAL EXAM:  VITAL SIGNS: ED Triage Vitals [03/29/18 1415]  Enc Vitals Group     BP (!) 157/101     Pulse Rate 92     Resp 18     Temp 99 F (37.2 C)     Temp Source Oral     SpO2 97 %     Weight 230 lb (104.3 kg)     Height 5\' 10"  (1.778 m)     Head Circumference      Peak Flow      Pain Score 9     Pain Loc      Pain Edu?      Excl. in [place]?     Constitutional: Alert and oriented.  Mild distress due to pain holding her abdomen. Eyes: Normal exam ENT   Head: Normocephalic and atraumatic.   Mouth/Throat: Mucous membranes are moist. Cardiovascular: Normal rate, regular rhythm. No murmur Respiratory: Normal respiratory effort without tachypnea nor retractions. Breath sounds are clear  Gastrointestinal: Soft, fairly diffuse mild tenderness to palpation with somewhat moderate tenderness across her lower abdomen.  No rebound or guarding.  No distention. Musculoskeletal: Nontender with normal range of motion in all extremities. No lower extremity tenderness or edema. Neurologic:  Normal speech and language. No gross focal neurologic deficits are appreciated. Skin:  Skin is warm, dry and intact.  Psychiatric: Mood and affect are normal. Speech and behavior are normal.   ____________________________________________   RADIOLOGY  CT shows moderate stool burden, otherwise no acute findings.  ____________________________________________   INITIAL IMPRESSION / ASSESSMENT AND PLAN / ED COURSE  Pertinent labs & imaging results that were available during my care of the patient were  reviewed by me and considered in my medical decision making (see chart for details).  Patient presents emergency department for diffuse abdominal pain more so across the lower abdomen as well as possible rectal bleeding today.  Differential would include rectal bleeding, colitis, diverticulitis, hemorrhoidal bleeding, prolapse, functional abdominal pain.  Patient has a history of significant chronic pain is currently seeing pain management, has had multiple ER visits for pain related complaints as well.  However given the patient's discomfort and diffuse tenderness I do believe a CT scan is warranted of the abdomen and pelvis.  Patient's blood work is reassuring including a normal white blood cell count as well as normal/unchanged H&H.  On rectal examination the patient has a moderate sized hemorrhoid but no active bleeding, largely nontender, no signs of thrombosis, light brown stool which is guaiac negative.  Toradol for pain control, Zofran for nausea IV fluids and obtain a CT scan.  No acute findings on CT scan, moderate stool burden.  We will discharge her MiraLAX.  We will have the patient follow-up with her doctor  as well as pain management specialist. ____________________________________________   FINAL CLINICAL IMPRESSION(S) / ED DIAGNOSES  Abdominal pain Constipation   Harvest Dark, MD 03/29/18 1821

## 2018-04-01 DIAGNOSIS — G8929 Other chronic pain: Secondary | ICD-10-CM | POA: Diagnosis not present

## 2018-04-01 DIAGNOSIS — M25561 Pain in right knee: Secondary | ICD-10-CM | POA: Diagnosis not present

## 2018-04-01 DIAGNOSIS — M25562 Pain in left knee: Secondary | ICD-10-CM | POA: Diagnosis not present

## 2018-04-05 DIAGNOSIS — M25561 Pain in right knee: Secondary | ICD-10-CM | POA: Diagnosis not present

## 2018-04-05 DIAGNOSIS — M25562 Pain in left knee: Secondary | ICD-10-CM | POA: Diagnosis not present

## 2018-04-05 DIAGNOSIS — G8929 Other chronic pain: Secondary | ICD-10-CM | POA: Diagnosis not present

## 2018-04-08 ENCOUNTER — Other Ambulatory Visit: Payer: Self-pay | Admitting: Student

## 2018-04-08 DIAGNOSIS — G8929 Other chronic pain: Secondary | ICD-10-CM | POA: Diagnosis not present

## 2018-04-08 DIAGNOSIS — R1012 Left upper quadrant pain: Secondary | ICD-10-CM

## 2018-04-08 DIAGNOSIS — R1312 Dysphagia, oropharyngeal phase: Secondary | ICD-10-CM | POA: Diagnosis not present

## 2018-04-08 DIAGNOSIS — K5909 Other constipation: Secondary | ICD-10-CM | POA: Diagnosis not present

## 2018-04-08 DIAGNOSIS — R1013 Epigastric pain: Secondary | ICD-10-CM | POA: Diagnosis not present

## 2018-04-08 DIAGNOSIS — R110 Nausea: Secondary | ICD-10-CM

## 2018-04-08 DIAGNOSIS — K219 Gastro-esophageal reflux disease without esophagitis: Secondary | ICD-10-CM | POA: Diagnosis not present

## 2018-04-08 DIAGNOSIS — K760 Fatty (change of) liver, not elsewhere classified: Secondary | ICD-10-CM

## 2018-04-09 ENCOUNTER — Ambulatory Visit
Admission: RE | Admit: 2018-04-09 | Discharge: 2018-04-09 | Disposition: A | Discharge status: Home / Self Care | Payer: Medicare Other | Source: Ambulatory Visit | Attending: Student | Admitting: Student

## 2018-04-09 DIAGNOSIS — R1013 Epigastric pain: Secondary | ICD-10-CM | POA: Diagnosis not present

## 2018-04-09 DIAGNOSIS — K760 Fatty (change of) liver, not elsewhere classified: Secondary | ICD-10-CM | POA: Diagnosis not present

## 2018-04-09 DIAGNOSIS — Z9049 Acquired absence of other specified parts of digestive tract: Secondary | ICD-10-CM | POA: Diagnosis not present

## 2018-04-09 DIAGNOSIS — R1012 Left upper quadrant pain: Secondary | ICD-10-CM

## 2018-04-09 DIAGNOSIS — R932 Abnormal findings on diagnostic imaging of liver and biliary tract: Secondary | ICD-10-CM | POA: Diagnosis not present

## 2018-04-09 DIAGNOSIS — R110 Nausea: Secondary | ICD-10-CM

## 2018-04-09 MED ORDER — GADOBENATE DIMEGLUMINE 529 MG/ML IV SOLN
20.0000 mL | Freq: Once | INTRAVENOUS | Status: AC | PRN
  Administered 2018-04-09: 20 mL via INTRAVENOUS

## 2018-04-11 ENCOUNTER — Other Ambulatory Visit: Payer: Self-pay | Admitting: Student

## 2018-04-11 DIAGNOSIS — G479 Sleep disorder, unspecified: Secondary | ICD-10-CM | POA: Diagnosis not present

## 2018-04-11 DIAGNOSIS — R1312 Dysphagia, oropharyngeal phase: Secondary | ICD-10-CM

## 2018-04-11 DIAGNOSIS — F3181 Bipolar II disorder: Secondary | ICD-10-CM | POA: Diagnosis not present

## 2018-04-11 DIAGNOSIS — F419 Anxiety disorder, unspecified: Secondary | ICD-10-CM | POA: Diagnosis not present

## 2018-04-18 DIAGNOSIS — J45909 Unspecified asthma, uncomplicated: Secondary | ICD-10-CM | POA: Diagnosis not present

## 2018-04-18 DIAGNOSIS — R079 Chest pain, unspecified: Secondary | ICD-10-CM | POA: Diagnosis not present

## 2018-04-18 DIAGNOSIS — R0602 Shortness of breath: Secondary | ICD-10-CM | POA: Diagnosis not present

## 2018-04-20 DIAGNOSIS — G8929 Other chronic pain: Secondary | ICD-10-CM | POA: Diagnosis not present

## 2018-04-20 DIAGNOSIS — M25562 Pain in left knee: Secondary | ICD-10-CM | POA: Diagnosis not present

## 2018-04-20 DIAGNOSIS — M25561 Pain in right knee: Secondary | ICD-10-CM | POA: Diagnosis not present

## 2018-04-27 DIAGNOSIS — F3181 Bipolar II disorder: Secondary | ICD-10-CM | POA: Diagnosis not present

## 2018-04-29 DIAGNOSIS — F3181 Bipolar II disorder: Secondary | ICD-10-CM | POA: Diagnosis not present

## 2018-05-02 DIAGNOSIS — R0602 Shortness of breath: Secondary | ICD-10-CM | POA: Diagnosis not present

## 2018-05-02 DIAGNOSIS — R079 Chest pain, unspecified: Secondary | ICD-10-CM | POA: Diagnosis not present

## 2018-05-04 ENCOUNTER — Ambulatory Visit
Admission: RE | Admit: 2018-05-04 | Discharge: 2018-05-04 | Disposition: A | Discharge status: Home / Self Care | Payer: Medicare Other | Source: Ambulatory Visit | Attending: Student | Admitting: Student

## 2018-05-04 DIAGNOSIS — R1312 Dysphagia, oropharyngeal phase: Secondary | ICD-10-CM | POA: Diagnosis not present

## 2018-05-04 NOTE — Therapy (Signed)
[place], [place], [address] Phone: [phone]   Fax:     Modified Barium Swallow  Patient Details  Name: [name] [name] MRN: [id number] Date of Birth: 1966-01-19 No data recorded  Encounter Date: 05/04/2018  End of Session - 05/04/18 1400    Visit Number  1    Number of Visits  1    Date for SLP Re-Evaluation  05/04/18    SLP Start Time  10    SLP Stop Time   1315    SLP Time Calculation (min)  45 min    Activity Tolerance  Patient tolerated treatment well       Past Medical History:  Diagnosis Date  . ADHD   . Anemia   . ANXIETY 06/06/2007  . Arthritis    related to lupus  . Asthma   . BACK PAIN 02/22/2009  . Cervical cancer ([place])   . Cervical cancer ([place])    LEEP 2005   . Chicken pox   . Chronic pain    goes to pain management provider  . Collagen vascular disease ([place])   . DEPRESSION 06/06/2007  . Essential tremor   . Fibromyalgia   . Fibromyalgia   . FREQUENCY, URINARY 07/07/2007  . Gastritis   . GERD 06/06/2007  . Glaucoma   . Headache    h/o migraines   . History of hiatal hernia   . History of kidney stones   . HYPOTHYROIDISM 06/06/2007  . INTERSTITIAL CYSTITIS 09/11/2010  . Lupus ([place]) 2006  . Lupus ([place])   . Menopause   . Migraine   . Mitral valve regurgitation   . NEPHROLITHIASIS, HX OF 11/05/2010  . Neuropathy   . Neuropathy   . OCD (obsessive compulsive disorder)   . Optic neuritis    2005  . OSTEOPENIA 10/14/2009  . Osteoporosis   . OVERACTIVE BLADDER 10/14/2009  . PAIN, CHRONIC NEC 06/06/2007  . Panniculitis   . POLYARTHRITIS 08/19/2007  . Raynaud's disease   . Sicca syndrome ([place])   . 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 Vaginal prolapse    Dr. Marcelline Mates [facility]   . Vasculitis ([place])   . WEIGHT GAIN 02/22/2009    Past Surgical History:  Procedure Laterality Date  . APPENDECTOMY    . CERVICAL BIOPSY  W/ LOOP ELECTRODE EXCISION  2005  .  CHOLECYSTECTOMY    . ESOPHAGOGASTRODUODENOSCOPY (EGD) WITH PROPOFOL N/A 08/27/2017   Procedure: ESOPHAGOGASTRODUODENOSCOPY (EGD) WITH PROPOFOL;  Surgeon: Manya Silvas, MD;  Location: [facility] ENDOSCOPY;  Service: Endoscopy;  Laterality: N/A;  . HAND SURGERY     repair of left metatarsal   . HARDWARE REMOVAL Right 02/16/2017   Procedure: HARDWARE REMOVAL FROM HIP;  Surgeon: Hessie Knows, MD;  Location: [facility] ORS;  Service: Orthopedics;  Laterality: Right;  . JOINT REPLACEMENT    . OTHER SURGICAL HISTORY     left 3rd metatarsal fracture repair 2011   . REVISION TOTAL HIP ARTHROPLASTY  06/2009   replacement 2010 then screw and plate removal 7322; right hip  . TONSILLECTOMY    . TONSILLECTOMY    . wrist  ligament repair bilateral    . WRIST RECONSTRUCTION    . WRIST SURGERY     laceration of right wrist ligaments  . wrist surgery     laceration of left wrist surgery     There were no vitals filed for this visit.    Subjective: Patient behavior: (alertness, ability to  follow instructions, etc.): The patient is able to verbalize her swallowing complaints and follow directions.  Chief complaint: frequent choking with foods and liquids   Objective:  Radiological Procedure: A videoflouroscopic evaluation of oral-preparatory, reflex initiation, and pharyngeal phases of the swallow was performed; as well as a screening of the upper esophageal phase.  I. POSTURE: Upright in MBS chair  II. VIEW: Lateral  III. COMPENSATORY STRATEGIES: Chin tuck prevents laryngeal penetration/cough; alternate liquid/solid reduces cervical esophagus residue; double swallow reduces cervical esophagus residue   IV. BOLUSES ADMINISTERED:   Thin Liquid: 1 cup rim, 3 cup rim with chin tuck   Nectar-thick Liquid: 1 moderate   Honey-thick Liquid: DNT   Puree: 3 teaspoon presentations   Mechanical Soft: 1/4 graham cracker in applesauce   Barium tablet  V. RESULTS OF EVALUATION: A. ORAL PREPARATORY PHASE:  (The lips, tongue, and velum are observed for strength and coordination)       **Overall Severity Rating: Within normal limits  B. SWALLOW INITIATION/REFLEX: (The reflex is normal if "triggered" by the time the bolus reached the base of the tongue)  **Overall Severity Rating: Mild; triggers while falling from the valleculae to the pyriform sinuses (liquids)  C. PHARYNGEAL PHASE: (Pharyngeal function is normal if the bolus shows rapid, smooth, and continuous transit through the pharynx and there is no pharyngeal residue after the swallow)  **Overall Severity Rating: Within normal limits  D. LARYNGEAL PENETRATION: (Material entering into the laryngeal inlet/vestibule but not aspirated) X1- transient  E. ASPIRATION: None  F. ESOPHAGEAL PHASE: (Screening of the upper esophagus) In the cervical esophagus there is a finger-like protrusion along the posterior wall during swallow consistent with prominent cricopharyngeus.  There is consistent trace retention of barium above this point within the cervical esophagus with inconsistent trace esophagus-to-pharynx backflow (remains in the pyriform sinus and does not appear to be an aspiration risk).  The patient was able to swallow a barium tablet, which moved rapidedly through the cervical esophagus.    ASSESSMENT: This 52 year old woman; with complaint of frequent choking while swallowing solids and liquids; is presenting with minimal oropharyngeal dysphagia characterized by delayed pharyngeal swallow initiation and transient laryngeal penetration X1.  Oral control of the bolus including oral hold, rotary mastication, and anterior to posterior transfer is within normal limits.  Timing of pharyngeal swallow initiation is delayed, triggering while falling from the valleculae to the pyriform sinuses.  Aspects of the pharyngeal stage of swallowing including tongue base retraction, hyolaryngeal excursion, epiglottic inversion, and duration/amplitude of UES opening are  within normal limits.  There is no significant pharyngeal residue or tracheal aspiration.  The patient was observed to have transient laryngeal penetration with the first thin liquid sip (cough occurred after the bolus had entered the esophagus).  A chin down posture improved airway protection and prevented penetration / cough.   The patient does not appear to be at significant risk for prandial aspiration.  In the cervical esophagus there is a finger-like protrusion along the posterior wall during swallow consistent with prominent cricopharyngeus.  There is consistent trace retention of barium above this point within the cervical esophagus with inconsistent trace esophagus-to-pharynx backflow (remains in the pyriform sinus and does not appear to be an aspiration risk).  The patient was able to swallow a barium tablet, which moved rapidedly through the cervical esophagus.  The patient indicated a point superior to the observed retained barium when asked to indicate the point she felt retention of food.  The patient's complaints  appear to be due to both oropharyngeal dysphagia (delayed timing) and esophageal dysphagia (prominent cricopharyngeus).  The patient was counseled to use the chin down posture for liquids, take small bites/sips, and alternate liquids and solids.  PLAN/RECOMMENDATIONS:   A. Diet: Regular   B. Swallowing Precautions: chin down posture for liquids, take small bites/sips, and alternate liquids and solids.   C. Recommended consultation to: follow up with GI as recommended   D. Therapy recommendations: speech therapy is not indicated   E. Results and recommendations were discussed with the patient immediately following the study and the final report routed to the referring MD.    Oropharyngeal dysphagia - Plan: DG Swallowing Func-Speech Pathology, DG Swallowing Func-Speech Pathology        Problem List Patient Active Problem List   Diagnosis Date Noted  . Allergic  rhinitis 12/16/2017  . Dizziness 12/16/2017  . Orthostatic hypotension 12/16/2017  . Generalized abdominal pain 12/16/2017  . Constipation 12/16/2017  . Acute nonintractable headache 12/13/2017  . Fibromyalgia 11/04/2017  . Status post hardware removal 02/16/2017  . Sicca syndrome ([place]) 06/04/2014  . Abdominal pain, epigastric 09/08/2013  . Type II or unspecified type diabetes mellitus without mention of complication, uncontrolled 03/15/2013  . UTI (urinary tract infection) 02/15/2013  . Chronic pain 03/31/2011  . NEPHROLITHIASIS, HX OF 11/05/2010  . INTERSTITIAL CYSTITIS 09/11/2010  . Overactive bladder 10/14/2009  . Disorder of bone and cartilage 10/14/2009  . Backache 02/22/2009  . WEIGHT GAIN 02/22/2009  . 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 POLYARTHRITIS 08/19/2007  . FREQUENCY, URINARY 07/07/2007  . Hypothyroidism 06/06/2007  . Anxiety state 06/06/2007  . Depression, recurrent ([place]) 06/06/2007  . PAIN, CHRONIC NEC 06/06/2007  . GERD 06/06/2007   Leroy Sea, MS/CCC- SLP  Lou Miner 05/04/2018, 2:01 PM  [facility] [place], [place], [address] Phone: [phone]   Fax:     Name: [name] [name] MRN: [id number] Date of Birth: [birth year]

## 2018-05-09 DIAGNOSIS — G43119 Migraine with aura, intractable, without status migrainosus: Secondary | ICD-10-CM | POA: Diagnosis not present

## 2018-05-09 DIAGNOSIS — M797 Fibromyalgia: Secondary | ICD-10-CM | POA: Diagnosis not present

## 2018-05-09 DIAGNOSIS — G250 Essential tremor: Secondary | ICD-10-CM | POA: Diagnosis not present

## 2018-05-10 DIAGNOSIS — Z79899 Other long term (current) drug therapy: Secondary | ICD-10-CM | POA: Diagnosis not present

## 2018-05-10 DIAGNOSIS — H04123 Dry eye syndrome of bilateral lacrimal glands: Secondary | ICD-10-CM | POA: Diagnosis not present

## 2018-05-11 DIAGNOSIS — R0602 Shortness of breath: Secondary | ICD-10-CM | POA: Diagnosis not present

## 2018-05-11 DIAGNOSIS — R079 Chest pain, unspecified: Secondary | ICD-10-CM | POA: Diagnosis not present

## 2018-05-11 DIAGNOSIS — M329 Systemic lupus erythematosus, unspecified: Secondary | ICD-10-CM | POA: Diagnosis not present

## 2018-05-13 ENCOUNTER — Other Ambulatory Visit: Payer: Self-pay | Admitting: *Deleted

## 2018-05-13 ENCOUNTER — Other Ambulatory Visit: Payer: Self-pay | Admitting: Internal Medicine

## 2018-05-13 ENCOUNTER — Telehealth: Payer: Self-pay | Admitting: Internal Medicine

## 2018-05-13 DIAGNOSIS — F3181 Bipolar II disorder: Secondary | ICD-10-CM | POA: Diagnosis not present

## 2018-05-13 DIAGNOSIS — E039 Hypothyroidism, unspecified: Secondary | ICD-10-CM

## 2018-05-13 MED ORDER — LEVOTHYROXINE SODIUM 25 MCG PO TABS: 25.0000 ug | ORAL_TABLET | Freq: Every day | ORAL | 3 refills | Status: DC

## 2018-05-13 NOTE — Telephone Encounter (Signed)
Copied from [place] [id number]. Topic: Quick Communication - Rx Refill/Question >> May 13, 2018 11:59 AM [name] [name] wrote: Medication: levothyroxine (SYNTHROID, LEVOTHROID) 25 MCG tablet (out of script)  Has the patient contacted their pharmacy? Yes.   (Agent: If no, request that the patient contact the pharmacy for the refill.) (Agent: If yes, when and what did the pharmacy advise?)  Preferred Pharmacy (with phone number or street name): [id number] - [place], [place] [address] [place] [address] Phone: [phone] Fax: [phone]  Agent: Please be advised that RX refills may take up to 3 business days. We ask that you follow-up with your pharmacy.

## 2018-05-13 NOTE — Telephone Encounter (Signed)
Rx refilled pre protocol- LOV: 12/18/lab

## 2018-05-18 ENCOUNTER — Other Ambulatory Visit: Payer: Self-pay | Admitting: Internal Medicine

## 2018-05-18 DIAGNOSIS — Z1231 Encounter for screening mammogram for malignant neoplasm of breast: Secondary | ICD-10-CM

## 2018-05-24 DIAGNOSIS — F3181 Bipolar II disorder: Secondary | ICD-10-CM | POA: Diagnosis not present

## 2018-05-26 ENCOUNTER — Emergency Department: Payer: Medicare Other

## 2018-05-26 ENCOUNTER — Emergency Department
Admission: EM | Admit: 2018-05-26 | Discharge: 2018-05-26 | Disposition: A | Discharge status: Home / Self Care | Payer: Medicare Other | Attending: Emergency Medicine | Admitting: Emergency Medicine

## 2018-05-26 ENCOUNTER — Encounter: Payer: Self-pay | Admitting: Emergency Medicine

## 2018-05-26 DIAGNOSIS — E119 Type 2 diabetes mellitus without complications: Secondary | ICD-10-CM | POA: Diagnosis not present

## 2018-05-26 DIAGNOSIS — Z96641 Presence of right artificial hip joint: Secondary | ICD-10-CM | POA: Diagnosis not present

## 2018-05-26 DIAGNOSIS — E039 Hypothyroidism, unspecified: Secondary | ICD-10-CM | POA: Insufficient documentation

## 2018-05-26 DIAGNOSIS — J45909 Unspecified asthma, uncomplicated: Secondary | ICD-10-CM | POA: Diagnosis not present

## 2018-05-26 DIAGNOSIS — Z91040 Latex allergy status: Secondary | ICD-10-CM | POA: Insufficient documentation

## 2018-05-26 DIAGNOSIS — Z79899 Other long term (current) drug therapy: Secondary | ICD-10-CM | POA: Diagnosis not present

## 2018-05-26 DIAGNOSIS — Z7982 Long term (current) use of aspirin: Secondary | ICD-10-CM | POA: Insufficient documentation

## 2018-05-26 DIAGNOSIS — R079 Chest pain, unspecified: Secondary | ICD-10-CM | POA: Diagnosis not present

## 2018-05-26 LAB — BASIC METABOLIC PANEL
Anion gap: 7 (ref 5–15)
BUN: 13 mg/dL (ref 6–20)
CALCIUM: 8.9 mg/dL (ref 8.9–10.3)
CO2: 25 mmol/L (ref 22–32)
CREATININE: 0.86 mg/dL (ref 0.44–1.00)
Chloride: 109 mmol/L (ref 98–111)
GFR calc non Af Amer: >60 mL/min (ref >60)
Glucose, Bld: 94 mg/dL (ref 70–99)
Potassium: 3.5 mmol/L (ref 3.5–5.1)
Sodium: 141 mmol/L (ref 135–145)

## 2018-05-26 LAB — TROPONIN I: Troponin I: <0.03 ng/mL (ref <0.03)

## 2018-05-26 LAB — CBC
HCT: 38.2 % (ref 35.0–47.0)
Hemoglobin: 12.4 g/dL (ref 12.0–16.0)
MCH: 29.2 pg (ref 26.0–34.0)
MCHC: 32.4 g/dL (ref 32.0–36.0)
MCV: 89.9 fL (ref 80.0–100.0)
PLATELETS: 140 10*3/uL — AB (ref 150–440)
RBC: 4.25 MIL/uL (ref 3.80–5.20)
RDW: 14.5 % (ref 11.5–14.5)
WBC: 4.9 10*3/uL (ref 3.6–11.0)

## 2018-05-26 MED ORDER — ISOSORBIDE MONONITRATE ER 60 MG PO TB24: 60.0000 mg | ORAL_TABLET | Freq: Every day | ORAL | 0 refills | Status: DC

## 2018-05-26 MED ORDER — NITROGLYCERIN 0.4 MG SL SUBL
0.4000 mg | SUBLINGUAL_TABLET | SUBLINGUAL | Status: DC | PRN
  Filled 2018-05-26: qty 1

## 2018-05-26 NOTE — Discharge Instructions (Signed)
Increase your Imdur to 60mg  once daily and follow up as soon as possible with Dr. Saralyn Pilar for continued evaluation of your symptoms. Return to the ER immediately if you have unremitting chest pain, severe pain, or other new concerns.

## 2018-05-26 NOTE — ED Notes (Signed)
Lab results and CXR results reviewed. Awaiting room for MD eval.

## 2018-05-26 NOTE — ED Provider Notes (Signed)
[facility] Emergency Department Provider Note  ____________________________________________  Time seen: Approximately 5:40 AM  I have reviewed the triage vital signs and the nursing notes.   HISTORY  Chief Complaint Chest Pain    HPI [name] [name] is a 52 y.o. female with a history of lupus and asthma who complains of chest pain that started last night.  She has a history of chronic recurrent chest pain, has had extensive cardiac work-up, is on a current trial of Imdur therapy, and if she has more chest pain she was told by her cardiologist t she would need a heart catheterization.   Patient reports that pain is intermittent, lasts about 5 minutes at a time, nonradiating, associated with shortness of breath.  No vomiting or diaphoresis.  Not exertional, not pleuritic.     Past Medical History:  Diagnosis Date  . ADHD   . Anemia   . ANXIETY 06/06/2007  . Arthritis    related to lupus  . Asthma   . BACK PAIN 02/22/2009  . Cervical cancer ([place])   . Cervical cancer ([place])    LEEP 2005   . Chicken pox   . Chronic pain    goes to pain management provider  . Collagen vascular disease ([place])   . DEPRESSION 06/06/2007  . Essential tremor   . Fibromyalgia   . Fibromyalgia   . FREQUENCY, URINARY 07/07/2007  . Gastritis   . GERD 06/06/2007  . Glaucoma   . Headache    h/o migraines   . History of hiatal hernia   . History of kidney stones   . HYPOTHYROIDISM 06/06/2007  . INTERSTITIAL CYSTITIS 09/11/2010  . Lupus ([place]) 2006  . Lupus ([place])   . Menopause   . Migraine   . Mitral valve regurgitation   . NEPHROLITHIASIS, HX OF 11/05/2010  . Neuropathy   . Neuropathy   . OCD (obsessive compulsive disorder)   . Optic neuritis    2005  . OSTEOPENIA 10/14/2009  . Osteoporosis   . OVERACTIVE BLADDER 10/14/2009  . PAIN, CHRONIC NEC 06/06/2007  . Panniculitis   . POLYARTHRITIS 08/19/2007  . Raynaud's disease   . Sicca syndrome ([place])   . UNSPECIFIED OPTIC  NEURITIS 11/08/2007  . Vaginal prolapse    Dr. Marcelline Mates [facility]   . Vasculitis ([place])   . WEIGHT GAIN 02/22/2009     Patient Active Problem List   Diagnosis Date Noted  . Allergic rhinitis 12/16/2017  . Dizziness 12/16/2017  . Orthostatic hypotension 12/16/2017  . Generalized abdominal pain 12/16/2017  . Constipation 12/16/2017  . Acute nonintractable headache 12/13/2017  . Fibromyalgia 11/04/2017  . Status post hardware removal 02/16/2017  . Sicca syndrome ([place]) 06/04/2014  . Abdominal pain, epigastric 09/08/2013  . Type II or unspecified type diabetes mellitus without mention of complication, uncontrolled 03/15/2013  . UTI (urinary tract infection) 02/15/2013  . Chronic pain 03/31/2011  . NEPHROLITHIASIS, HX OF 11/05/2010  . INTERSTITIAL CYSTITIS 09/11/2010  . Overactive bladder 10/14/2009  . Disorder of bone and cartilage 10/14/2009  . Backache 02/22/2009  . WEIGHT GAIN 02/22/2009  . 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 POLYARTHRITIS 08/19/2007  . FREQUENCY, URINARY 07/07/2007  . Hypothyroidism 06/06/2007  . Anxiety state 06/06/2007  . Depression, recurrent ([place]) 06/06/2007  . PAIN, CHRONIC NEC 06/06/2007  . GERD 06/06/2007     Past Surgical History:  Procedure Laterality Date  . APPENDECTOMY    . CERVICAL BIOPSY  W/ LOOP ELECTRODE EXCISION  2005  . CHOLECYSTECTOMY    .  ESOPHAGOGASTRODUODENOSCOPY (EGD) WITH PROPOFOL N/A 08/27/2017   Procedure: ESOPHAGOGASTRODUODENOSCOPY (EGD) WITH PROPOFOL;  Surgeon: Manya Silvas, MD;  Location[facility] ENDOSCOPY;  Service: Endoscopy;  Laterality: N/A;  . HAND SURGERY     repair of left metatarsal   . HARDWARE REMOVAL Right 02/16/2017   Procedure: HARDWARE REMOVAL FROM HIP;  Surgeon: Hessie Knows, MD;  Location: [facility] ORS;  Service: Orthopedics;  Laterality: Right;  . JOINT REPLACEMENT    . OTHER SURGICAL HISTORY     left 3rd metatarsal fracture repair 2011   . REVISION TOTAL HIP ARTHROPLASTY  06/2009   replacement 2010 then  screw and plate removal 5643; right hip  . TONSILLECTOMY    . TONSILLECTOMY    . wrist  ligament repair bilateral    . WRIST RECONSTRUCTION    . WRIST SURGERY     laceration of right wrist ligaments  . wrist surgery     laceration of left wrist surgery      Prior to Admission medications   Medication Sig Start Date End Date Taking? Authorizing Provider  albuterol (VENTOLIN HFA) 108 (90 Base) MCG/ACT inhaler Ventolin HFA 90 mcg/actuation aerosol inhaler  INHALE 2 PUFFS (180 MCG) BY INHALATION ROUTE EVERY 4 HOURS    [provider]  ARIPiprazole (ABILIFY) 20 MG tablet Take 20 mg by mouth daily.    [provider]  Ascorbic Acid (VITAMIN C) 500 MG CHEW Chew 2,500 mg by mouth daily.    [provider]  aspirin 325 MG tablet Take 1 tablet (325 mg total) by mouth daily. 02/18/17   Duanne Guess, PA-C  Biotin 10 MG CAPS Take by mouth.    [provider]  Biotin 5000 MCG SUBL Place 10,000 mcg under the tongue daily.    [provider]  budesonide-formoterol (SYMBICORT) 160-4.5 MCG/ACT inhaler Symbicort 160 mcg-4.5 mcg/actuation HFA aerosol inhaler    [provider]  butalbital-acetaminophen-caffeine (FIORICET, ESGIC) 50-325-40 MG tablet Take 1-2 tablets by mouth every 6 (six) hours as needed for headache. 02/25/18 02/25/19  Harvest Dark, MD  calcipotriene (DOVONOX) 0.005 % ointment calcipotriene 0.005 % topical ointment  APPLY A THIN LAYER TO THE AFFECTED AREA(S) BY TOPICAL ROUTE ONCE DAILY ; RUB IN GENTLY AND COMPLETELY    [provider]  Cholecalciferol (VITAMIN D3) 2000 units capsule Vitamin D3 2,000 unit capsule    [provider]  dapsone 25 MG tablet Take by mouth daily.     [provider]  dexmethylphenidate (FOCALIN XR) 20 MG 24 hr capsule dexmethylphenidate ER 20 mg capsule,extended release biphasic50-50    [provider]  diclofenac sodium (VOLTAREN) 1 % GEL Apply 2 g topically 4 (four)  times daily as needed (for knee & finger pain.).     [provider]  dicyclomine (BENTYL) 10 MG capsule dicyclomine 10 mg capsule  every 8hours as needed    [provider]  DULoxetine (CYMBALTA) 60 MG capsule duloxetine 60 mg capsule,delayed release  1 twice daily    [provider]  DULoxetine (CYMBALTA) 60 MG capsule Take 60 mg by mouth 2 (two) times daily.    [provider]  EPINEPHrine (EPIPEN 2-PAK) 0.3 mg/0.3 mL IJ SOAJ injection EpiPen 2-Pak 0.3 mg/0.3 mL injection, auto-injector    [provider]  etodolac (LODINE) 500 MG tablet etodolac 500 mg tablet    [provider]  fentaNYL (DURAGESIC - DOSED MCG/HR) 75 MCG/HR Place 75 mcg onto the skin every 3 (three) days.    [provider]  fluocinonide ointment (LIDEX) 0.05 % fluocinonide 0.05 % topical ointment  APPLY TO THE AFFECTED AREA(S) BY TOPICAL ROUTE 2 TIMES PER DAY    [provider]  folic acid (FOLVITE) 1 MG tablet Take 1 mg by mouth daily.    [provider]  hydrocortisone (PROCTOZONE-HC) 2.5 % rectal cream Proctozone-HC 2.5 % topical cream perineal applicator    [provider]  hydroxychloroquine (PLAQUENIL) 200 MG tablet Plaquenil 200 mg tablet  Take 1 tablet twice a day by oral route after meals for 90 days.    [provider]  ibuprofen (ADVIL,MOTRIN) 800 MG tablet Take 1 tablet (800 mg total) by mouth every 8 (eight) hours as needed. 08/04/17   Laban Emperor, PA-C  imiquimod (ALDARA) 5 % cream imiquimod 5 % topical cream packet  APPLY TO THE AFFECTED AREA(S) BY TOPICAL ROUTE 3 TIMES PER WEEK at bedtime    [provider]  isosorbide mononitrate (IMDUR) 60 MG 24 hr tablet Take 1 tablet (60 mg total) by mouth daily. 05/26/18   Carrie Mew, MD  ketorolac (TORADOL) 10 MG tablet Take 1 tablet (10 mg total) every 8 (eight) hours as needed by mouth for moderate pain (with food). 10/11/17   Eula Listen, MD   levothyroxine (SYNTHROID, LEVOTHROID) 25 MCG tablet Take 1 tablet (25 mcg total) by mouth daily before breakfast. 05/13/18   McLean-Scocuzza, Nino Glow, MD  lidocaine (LIDODERM) 5 % Place 1 patch onto the skin every 12 (twelve) hours. Remove & Discard patch within 12 hours or as directed by MD 08/04/17 08/04/18  Laban Emperor, PA-C  LORazepam (ATIVAN) 2 MG tablet Take 1-2 mg by mouth See admin instructions. 1 MG DAILY AS NEEDED FOR ANXIETY & SCHEDULED AT BEDTIME EACH NIGHT    [provider]  methylphenidate 18 MG PO CR tablet Take by mouth.     [provider]  montelukast (SINGULAIR) 10 MG tablet montelukast 10 mg tablet 1 pill qhs 12/13/17   McLean-Scocuzza, Nino Glow, MD  Multiple Vitamins-Minerals (MULTI-VITAMIN GUMMIES PO) Multi Vitamin  GUMMIES    [provider]  ondansetron (ZOFRAN) 4 MG tablet Zofran 8 mg tablet  Take 1 tablet every 8 hours by oral route as needed.    [provider]  oxyCODONE-acetaminophen (PERCOCET/ROXICET) 5-325 MG tablet Take 1-2 tablets by mouth every 4 (four) hours as needed for moderate pain. 02/17/17   Duanne Guess, PA-C  pantoprazole (PROTONIX) 40 MG tablet Take one tablet by mouth twice daily Patient taking differently: TAKE 1 TABLET (40 MG) BY MOUTH ONCE DAILY IN THE MORNING. 12/18/13   Marletta Lor, MD  PARoxetine (PAXIL) 10 MG tablet TAKE 1 TABLET BY MOUTH AT BEDTIME. 08/30/17   Rubie Maid, MD  polyethylene glycol powder (GLYCOLAX/MIRALAX) powder Take 17 g by mouth daily. 03/29/18   Harvest Dark, MD  Polyvinyl Alcohol-Povidone (REFRESH OP) Place 1 drop into both eyes 2 (two) times daily.    [provider]  predniSONE (DELTASONE) 10 MG tablet prednisone 10 mg tablet  1 daily    [provider]  Probiotic Product (PROBIOTIC PO) Probiotic 10 billion cell capsule  daily    [provider]  silver sulfADIAZINE (SILVADENE) 1 % cream silver sulfadiazine 1 % topical cream    [provider]  Sodium Chloride 3 % AERS Saline Nasal Mist 3 %  spray nose deeply every hour or two and blow nose    [provider]  solifenacin (VESICARE) 5 MG tablet  Take 1 tablet (5 mg total) by mouth daily. 12/13/17   McLean-Scocuzza, Nino Glow, MD  sucralfate (CARAFATE) 1 g tablet sucralfate 1 gram tablet    [provider]  topiramate (TOPAMAX) 100 MG tablet topiramate 100 mg tablet  TAKE 1 TABLET BY MOUTH 2 TIMES DAILY.    [provider]  valACYclovir (VALTREX) 500 MG tablet valacyclovir 500 mg tablet  1 daily    [provider]     Allergies Latex; Prasterone; Shellfish-derived products; Bee venom; Compazine [prochlorperazine edisylate]; Dhea [nutritional supplements]; Dilaudid [hydromorphone hcl]; Lactose intolerance (gi); Other; Prochlorperazine; Promethazine; Promethazine hcl; Reclast [zoledronic acid]; Clarithromycin; Fish allergy; Hydromorphone hcl; and Penicillins   Family History  Problem Relation Age of Onset  . Hypertension Mother   . Arthritis Mother   . Heart disease Mother        afib  . Asthma Sister   . Asthma Daughter   . Arthritis Daughter   . Heart disease Daughter        ?heart condition on BB  . ADD / ADHD Daughter   . Pancreatic cancer Father   . Cancer Father        pancreatitic   . Diabetes Maternal Grandmother   . Heart disease Maternal Grandmother   . Arthritis Maternal Grandmother   . Hypertension Maternal Grandmother   . Colon cancer Maternal Uncle 64  . Crohn's disease Maternal Aunt   . Diabetes Maternal Aunt   . Breast cancer Cousin 41       maternal  . Cancer Cousin        m cousin breat cancer s/p removal both breasts     Social History Social History   Tobacco Use  . Smoking status: Never Smoker  . Smokeless tobacco: Never Used  Substance Use Topics  . Alcohol use: No  . Drug use: No    Review of Systems  Constitutional:   No fever or chills.  ENT:   No sore throat. No  rhinorrhea. Cardiovascular: Positive as above chest pain without syncope. Respiratory:   No dyspnea or cough. Gastrointestinal:   Negative for abdominal pain, vomiting and diarrhea.  Musculoskeletal:   Negative for focal pain or swelling All other systems reviewed and are negative except as documented above in ROS and HPI.  ____________________________________________   PHYSICAL EXAM:  VITAL SIGNS: ED Triage Vitals [05/26/18 0211]  Enc Vitals Group     BP (!) 126/53     Pulse Rate 77     Resp 17     Temp 98.1 F (36.7 C)     Temp Source Oral     SpO2 100 %     Weight 230 lb (104.3 kg)     Height      Head Circumference      Peak Flow      Pain Score 8     Pain Loc      Pain Edu?      Excl. in [place]?     Vital signs reviewed, nursing assessments reviewed.   Constitutional:   Alert and oriented. Non-toxic appearance. Eyes:   Conjunctivae are normal. EOMI. PERRL. ENT      Head:   Normocephalic and atraumatic.      Nose:   No congestion/rhinnorhea.       Mouth/Throat:   MMM, no pharyngeal erythema. No peritonsillar mass.       Neck:   No meningismus. Full ROM. Hematological/Lymphatic/Immunilogical:   No cervical lymphadenopathy. Cardiovascular:   RRR.  Symmetric bilateral radial and DP pulses.  No murmurs.  Respiratory:   Normal respiratory effort without tachypnea/retractions. Breath sounds are clear and equal bilaterally. No wheezes/rales/rhonchi. Gastrointestinal:   Soft and nontender. Non distended. There is no CVA tenderness.  No rebound, rigidity, or guarding. Genitourinary:   deferred Musculoskeletal:   Normal range of motion in all extremities. No joint effusions.  No lower extremity tenderness.  No edema.  Chest wall nontender Neurologic:   Normal speech and language.  Motor grossly intact. No acute focal neurologic deficits are appreciated.  Skin:    Skin is warm, dry and intact. No rash noted.  No petechiae, purpura, or  bullae.  ____________________________________________    LABS (pertinent positives/negatives) (all labs ordered are listed, but only abnormal results are displayed) Labs Reviewed  CBC - Abnormal; Notable for the following components:      Result Value   Platelets 140 (*)    All other components within normal limits  BASIC METABOLIC PANEL  TROPONIN I  TROPONIN I  POC URINE PREG, ED   ____________________________________________   EKG  Interpreted by me Sinus rhythm rate of 78, normal axis intervals QRS ST segments and T waves.  No acute ischemic changes.  ____________________________________________    RADIOLOGY  Dg Chest 2 View  Result Date: 05/26/2018 CLINICAL DATA:  Intermittent left-sided chest pain. EXAM: CHEST - 2 VIEW COMPARISON:  10/11/2017 FINDINGS: Mild emphysematous hyperinflation of the lungs, upper lobe predominant with crowding of lower lobe interstitial lung markings. No alveolar consolidation, effusion or pneumothorax. No overt pulmonary edema. Heart size is within normal limits. There is minimal aortic atherosclerosis without aneurysm. No acute osseous abnormality. Cholecystectomy clips are present in the right upper quadrant. IMPRESSION: Emphysematous hyperinflation of the lungs, upper lobe predominant. No active pulmonary disease. Electronically Signed   By: Ashley Royalty M.D.   On: 05/26/2018 02:51    ____________________________________________   PROCEDURES Procedures  ____________________________________________  DIFFERENTIAL DIAGNOSIS   Nonspecific chest pain, non-STEMI  CLINICAL IMPRESSION / ASSESSMENT AND PLAN / ED COURSE  Pertinent labs & imaging results that were available during my care of the patient were reviewed by me and considered in my medical decision making (see chart for details).    Patient presents with chest pain.  She is very calm comfortable and well-appearing.  Reports the pain is currently presents again, will try nitro,  plan to discharge home.  I doubt ACS PE dissection AAA pneumothorax or carditis.  Check a second troponin, give some sublingual nitroglycerin.  His symptoms are resolved and troponin is negative, patient will be suitable for discharge home with family.   ----------------------------------------- 7:21 AM on 05/26/2018 -----------------------------------------  Second troponin negative.  Discussed with Dr.: Who recommends doubling the Imdur and will facilitate close follow-up with Dr. Saralyn Pilar.  Return precautions discussed with patient.  She is agreeable to discharge home and continued outpatient management.  Currently asymptomatic.     ____________________________________________   FINAL CLINICAL IMPRESSION(S) / ED DIAGNOSES    Final diagnoses:  Nonspecific chest pain     ED Discharge Orders        Ordered    isosorbide mononitrate (IMDUR) 60 MG 24 hr tablet  Daily     05/26/18 0719      Portions of this note were generated with dragon dictation software. Dictation errors may occur despite best attempts at proofreading.    Carrie Mew, MD 05/26/18 [phone]

## 2018-05-26 NOTE — ED Triage Notes (Signed)
Pt c/o intermittent left sided chest pain that radiates into the left arm. Pt reports symptoms x1 1/2 months and has been seen by cardiologist, prescribed Imdur and told if continued pain that pt would need cath. Pt denies N/V.

## 2018-05-27 DIAGNOSIS — R079 Chest pain, unspecified: Secondary | ICD-10-CM | POA: Diagnosis not present

## 2018-05-27 DIAGNOSIS — M329 Systemic lupus erythematosus, unspecified: Secondary | ICD-10-CM | POA: Diagnosis not present

## 2018-05-27 DIAGNOSIS — R0602 Shortness of breath: Secondary | ICD-10-CM | POA: Diagnosis not present

## 2018-06-10 DIAGNOSIS — Z79899 Other long term (current) drug therapy: Secondary | ICD-10-CM | POA: Diagnosis not present

## 2018-06-10 DIAGNOSIS — F3181 Bipolar II disorder: Secondary | ICD-10-CM | POA: Diagnosis not present

## 2018-06-10 DIAGNOSIS — H40033 Anatomical narrow angle, bilateral: Secondary | ICD-10-CM | POA: Diagnosis not present

## 2018-06-15 ENCOUNTER — Ambulatory Visit
Admission: RE | Admit: 2018-06-15 | Discharge: 2018-06-15 | Disposition: A | Discharge status: Home / Self Care | Payer: Medicare Other | Source: Ambulatory Visit | Attending: Internal Medicine | Admitting: Internal Medicine

## 2018-06-15 DIAGNOSIS — Z1231 Encounter for screening mammogram for malignant neoplasm of breast: Secondary | ICD-10-CM | POA: Insufficient documentation

## 2018-06-20 DIAGNOSIS — F3181 Bipolar II disorder: Secondary | ICD-10-CM | POA: Diagnosis not present

## 2018-06-27 DIAGNOSIS — R9439 Abnormal result of other cardiovascular function study: Secondary | ICD-10-CM | POA: Insufficient documentation

## 2018-06-27 DIAGNOSIS — M329 Systemic lupus erythematosus, unspecified: Secondary | ICD-10-CM | POA: Diagnosis not present

## 2018-06-27 DIAGNOSIS — R0602 Shortness of breath: Secondary | ICD-10-CM | POA: Diagnosis not present

## 2018-06-27 DIAGNOSIS — R079 Chest pain, unspecified: Secondary | ICD-10-CM | POA: Diagnosis not present

## 2018-06-30 DIAGNOSIS — G479 Sleep disorder, unspecified: Secondary | ICD-10-CM | POA: Diagnosis not present

## 2018-06-30 DIAGNOSIS — F3181 Bipolar II disorder: Secondary | ICD-10-CM | POA: Diagnosis not present

## 2018-06-30 DIAGNOSIS — F419 Anxiety disorder, unspecified: Secondary | ICD-10-CM | POA: Diagnosis not present

## 2018-07-01 ENCOUNTER — Other Ambulatory Visit (HOSPITAL_COMMUNITY)
Admission: RE | Admit: 2018-07-01 | Discharge: 2018-07-01 | Disposition: A | Discharge status: Home / Self Care | Payer: Medicare Other | Source: Ambulatory Visit | Attending: Obstetrics and Gynecology | Admitting: Obstetrics and Gynecology

## 2018-07-01 ENCOUNTER — Encounter: Payer: Self-pay | Admitting: Obstetrics and Gynecology

## 2018-07-01 ENCOUNTER — Ambulatory Visit: Payer: Medicare Other | Admitting: Obstetrics and Gynecology

## 2018-07-01 VITALS — BP 112/63 | HR 88 | Ht 70.0 in | Wt 227.0 lb

## 2018-07-01 DIAGNOSIS — Z124 Encounter for screening for malignant neoplasm of cervix: Secondary | ICD-10-CM | POA: Insufficient documentation

## 2018-07-01 DIAGNOSIS — Z13220 Encounter for screening for lipoid disorders: Secondary | ICD-10-CM | POA: Diagnosis not present

## 2018-07-01 DIAGNOSIS — L659 Nonscarring hair loss, unspecified: Secondary | ICD-10-CM | POA: Diagnosis not present

## 2018-07-01 DIAGNOSIS — N951 Menopausal and female climacteric states: Secondary | ICD-10-CM

## 2018-07-01 DIAGNOSIS — Z01419 Encounter for gynecological examination (general) (routine) without abnormal findings: Secondary | ICD-10-CM | POA: Diagnosis not present

## 2018-07-01 DIAGNOSIS — Z1151 Encounter for screening for human papillomavirus (HPV): Secondary | ICD-10-CM | POA: Diagnosis not present

## 2018-07-01 DIAGNOSIS — Z8639 Personal history of other endocrine, nutritional and metabolic disease: Secondary | ICD-10-CM

## 2018-07-01 DIAGNOSIS — M328 Other forms of systemic lupus erythematosus: Secondary | ICD-10-CM | POA: Diagnosis not present

## 2018-07-01 MED ORDER — PAROXETINE HCL 10 MG PO TABS: 10.0000 mg | ORAL_TABLET | Freq: Every day | ORAL | 11 refills | Status: DC

## 2018-07-01 NOTE — Progress Notes (Signed)
ANNUAL PREVENTATIVE CARE GYNECOLOGY  ENCOUNTER NOTE  Subjective:       [name] [name] is a 52 y.o. G1P1 female with h/o lupus here for a routine annual gynecologic exam. The patient is not sexually active. The patient has never been taking hormone replacement therapy. Patient denies post-menopausal vaginal bleeding. The patient wears seatbelts: yes. The patient participates in regular exercise: no. Has the patient ever been transfused or tattooed?: no. The patient reports that there is not currently domestic violence in her life. Does have h/o in the past with ex-husband.   Current complaints: 1.  She does note hair loss all over her body and thinning of her hair on her head. Wonders if this is due to the lupus or is the due to menopause.    Gynecologic History Patient's last menstrual period was 01/02/2016 (exact date). Contraception: menopausal Last Pap: cannot recall last pap smear Last mammogram: 05/2018. Results were: normal Last Colonoscopy: 2018.  Results were: benign polyps. Needs repeat in 5 years.  Last Dexa Scan: Never had one   Obstetric History OB History  Gravida Para Term Preterm AB Living  1 1       1   SAB TAB Ectopic Multiple Live Births          1    # Outcome Date GA Lbr Len/2nd Weight Sex Delivery Anes PTL Lv  1 Para 06/19/98    F Vag-Spont   LIV    Past Medical History:  Diagnosis Date  . ADHD   . Anemia   . ANXIETY 06/06/2007  . Arthritis    related to lupus  . Asthma   . BACK PAIN 02/22/2009  . Cervical cancer ([place])    LEEP 2005   . Chicken pox   . Chronic pain    goes to pain management provider  . Collagen vascular disease ([place])   . DEPRESSION 06/06/2007  . Essential tremor   . Fibromyalgia   . Fibromyalgia   . FREQUENCY, URINARY 07/07/2007  . Gastritis   . GERD 06/06/2007  . Glaucoma   . Headache    h/o migraines   . History of hiatal hernia   . History of kidney stones   . HYPOTHYROIDISM 06/06/2007  . INTERSTITIAL CYSTITIS 09/11/2010    . Lupus ([place]) 2006  . Lupus ([place])   . Menopause   . Migraine   . Mitral valve regurgitation   . NEPHROLITHIASIS, HX OF 11/05/2010  . Neuropathy   . Neuropathy   . OCD (obsessive compulsive disorder)   . Optic neuritis    2005  . OSTEOPENIA 10/14/2009  . Osteoporosis   . OVERACTIVE BLADDER 10/14/2009  . PAIN, CHRONIC NEC 06/06/2007  . Panniculitis   . POLYARTHRITIS 08/19/2007  . Raynaud's disease   . Sicca syndrome ([place])   . 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 Vaginal prolapse    Dr. Marcelline Mates [facility]   . Vasculitis ([place])   . WEIGHT GAIN 02/22/2009    Family History  Problem Relation Age of Onset  . Hypertension Mother   . Arthritis Mother   . Heart disease Mother        afib  . Asthma Sister   . Asthma Daughter   . Arthritis Daughter   . Heart disease Daughter        ?heart condition on BB  . ADD / ADHD Daughter   . Pancreatic cancer Father   . Cancer Father        pancreatitic   .  Diabetes Maternal Grandmother   . Heart disease Maternal Grandmother   . Arthritis Maternal Grandmother   . Hypertension Maternal Grandmother   . Colon cancer Maternal Uncle 32  . Crohn's disease Maternal Aunt   . Diabetes Maternal Aunt   . Breast cancer Cousin 33       maternal  . Cancer Cousin        m cousin breat cancer s/p removal both breasts     Past Surgical History:  Procedure Laterality Date  . APPENDECTOMY    . CERVICAL BIOPSY  W/ LOOP ELECTRODE EXCISION  2005  . CHOLECYSTECTOMY    . ESOPHAGOGASTRODUODENOSCOPY (EGD) WITH PROPOFOL N/A 08/27/2017   Procedure: ESOPHAGOGASTRODUODENOSCOPY (EGD) WITH PROPOFOL;  Surgeon: Manya Silvas, MD;  Location: [facility]COPY;  Service: Endoscopy;  Laterality: N/A;  . HAND SURGERY     repair of left metatarsal   . HARDWARE REMOVAL Right 02/16/2017   Procedure: HARDWARE REMOVAL FROM HIP;  Surgeon: Hessie Knows, MD;  Location: [facility] ORS;  Service: Orthopedics;  Laterality: Right;  . JOINT REPLACEMENT    . OTHER SURGICAL HISTORY      left 3rd metatarsal fracture repair 2011   . REVISION TOTAL HIP ARTHROPLASTY  06/2009   replacement 2010 then screw and plate removal [id number]; right hip  . TONSILLECTOMY    . TONSILLECTOMY    . wrist  ligament repair bilateral    . WRIST RECONSTRUCTION    . WRIST SURGERY     laceration of right wrist ligaments  . wrist surgery     laceration of left wrist surgery     Social History   Socioeconomic History  . Marital status: Divorced    Spouse name: Not on file  . Number of children: 1  . Years of education: Not on file  . Highest education level: Not on file  Occupational History  . Occupation: DISABLED    Employer: UNEMPLOYED  Social Needs  . Financial resource strain: Not on file  . Food insecurity:    Worry: Not on file    Inability: Not on file  . Transportation needs:    Medical: Not on file    Non-medical: Not on file  Tobacco Use  . Smoking status: Never Smoker  . Smokeless tobacco: Never Used  Substance and Sexual Activity  . Alcohol use: No  . Drug use: No  . Sexual activity: Not Currently    Birth control/protection: None, Post-menopausal  Lifestyle  . Physical activity:    Days per week: Not on file    Minutes per session: Not on file  . Stress: Not on file  Relationships  . Social connections:    Talks on phone: Not on file    Gets together: Not on file    Attends religious service: Not on file    Active member of club or organization: Not on file    Attends meetings of clubs or organizations: Not on file    Relationship status: Not on file  . Intimate partner violence:    Fear of current or ex partner: Not on file    Emotionally abused: Not on file    Physically abused: Not on file    Forced sexual activity: Not on file  Other Topics Concern  . 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  Current  Outpatient Medications on File Prior to Visit  Medication Sig Dispense Refill  . albuterol (VENTOLIN HFA) 108 (90 Base) MCG/ACT inhaler Ventolin HFA 90 mcg/actuation aerosol inhaler  INHALE 2 PUFFS (180 MCG) BY INHALATION ROUTE EVERY 4 HOURS    . ARIPiprazole (ABILIFY) 20 MG tablet Take 20 mg by mouth daily.    . Ascorbic Acid (VITAMIN C) 500 MG CHEW Chew 2,500 mg by mouth daily.    Marland Kitchen aspirin 325 MG tablet Take 1 tablet (325 mg total) by mouth daily. 30 tablet 0  . Biotin 10 MG CAPS Take by mouth.    . Biotin 5000 MCG SUBL Place 10,000 mcg under the tongue daily.    . butalbital-acetaminophen-caffeine (FIORICET, ESGIC) 50-325-40 MG tablet Take 1-2 tablets by mouth every 6 (six) hours as needed for headache. 20 tablet 0  . calcipotriene (DOVONOX) 0.005 % ointment calcipotriene 0.005 % topical ointment  APPLY A THIN LAYER TO THE AFFECTED AREA(S) BY TOPICAL ROUTE ONCE DAILY ; RUB IN GENTLY AND COMPLETELY    . Cholecalciferol (VITAMIN D3) 2000 units capsule Vitamin D3 2,000 unit capsule    . dapsone 25 MG tablet Take by mouth daily.     Marland Kitchen dexmethylphenidate (FOCALIN XR) 20 MG 24 hr capsule dexmethylphenidate ER 20 mg capsule,extended release biphasic50-50    . diclofenac sodium (VOLTAREN) 1 % GEL Apply 2 g topically 4 (four) times daily as needed (for knee & finger pain.).     Marland Kitchen dicyclomine (BENTYL) 10 MG capsule dicyclomine 10 mg capsule  every 8hours as needed    . DULoxetine (CYMBALTA) 60 MG capsule duloxetine 60 mg capsule,delayed release  1 twice daily    . DULoxetine (CYMBALTA) 60 MG capsule Take 60 mg by mouth 2 (two) times daily.    Marland Kitchen EPINEPHrine (EPIPEN 2-PAK) 0.3 mg/0.3 mL IJ SOAJ injection EpiPen 2-Pak 0.3 mg/0.3 mL injection, auto-injector    . etodolac (LODINE) 500 MG tablet etodolac 500 mg tablet    . fentaNYL (DURAGESIC - DOSED MCG/HR) 75 MCG/HR Place 75 mcg onto the skin every 3 (three) days.    . fluocinonide ointment (LIDEX) 0.05 % fluocinonide 0.05 % topical ointment  APPLY TO  THE AFFECTED AREA(S) BY TOPICAL ROUTE 2 TIMES PER DAY    . folic acid (FOLVITE) 1 MG tablet Take 1 mg by mouth daily.    . hydrocortisone (PROCTOZONE-HC) 2.5 % rectal cream Proctozone-HC 2.5 % topical cream perineal applicator    . hydroxychloroquine (PLAQUENIL) 200 MG tablet Plaquenil 200 mg tablet  Take 1 tablet twice a day by oral route after meals for 90 days.    Marland Kitchen ibuprofen (ADVIL,MOTRIN) 800 MG tablet Take 1 tablet (800 mg total) by mouth every 8 (eight) hours as needed. 30 tablet 0  . imiquimod (ALDARA) 5 % cream imiquimod 5 % topical cream packet  APPLY TO THE AFFECTED AREA(S) BY TOPICAL ROUTE 3 TIMES PER WEEK at bedtime    . isosorbide mononitrate (IMDUR) 60 MG 24 hr tablet Take 1 tablet (60 mg total) by mouth daily. 20 tablet 0  . ketorolac (TORADOL) 10 MG tablet Take 1 tablet (10 mg total) every 8 (eight) hours as needed by mouth for moderate pain (with food). 10 tablet 0  . levothyroxine (SYNTHROID, LEVOTHROID) 25 MCG tablet Take 1 tablet (25 mcg total) by mouth daily before breakfast. 90 tablet 3  . lidocaine (LIDODERM) 5 % Place 1 patch onto the skin every 12 (twelve) hours. Remove & Discard patch within 12 hours or as  directed by MD 10 patch 0  . LORazepam (ATIVAN) 2 MG tablet Take 1-2 mg by mouth See admin instructions. 1 MG DAILY AS NEEDED FOR ANXIETY & SCHEDULED AT BEDTIME EACH NIGHT    . methylphenidate 18 MG PO CR tablet Take by mouth.     . montelukast (SINGULAIR) 10 MG tablet montelukast 10 mg tablet 1 pill qhs 90 tablet 3  . Multiple Vitamins-Minerals (MULTI-VITAMIN GUMMIES PO) Multi Vitamin  GUMMIES    . ondansetron (ZOFRAN) 4 MG tablet Zofran 8 mg tablet  Take 1 tablet every 8 hours by oral route as needed.    . pantoprazole (PROTONIX) 40 MG tablet Take one tablet by mouth twice daily (Patient taking differently: TAKE 1 TABLET (40 MG) BY MOUTH ONCE DAILY IN THE MORNING.) 180 tablet 3  . PARoxetine (PAXIL) 10 MG tablet TAKE 1 TABLET BY MOUTH AT BEDTIME. 30 tablet 6  .  polyethylene glycol powder (GLYCOLAX/MIRALAX) powder Take 17 g by mouth daily. 255 g 0  . Polyvinyl Alcohol-Povidone (REFRESH OP) Place 1 drop into both eyes 2 (two) times daily.    . predniSONE (DELTASONE) 10 MG tablet prednisone 10 mg tablet  1 daily    . Probiotic Product (PROBIOTIC PO) Probiotic 10 billion cell capsule  daily    . silver sulfADIAZINE (SILVADENE) 1 % cream silver sulfadiazine 1 % topical cream    . Sodium Chloride 3 % AERS Saline Nasal Mist 3 %  spray nose deeply every hour or two and blow nose    . solifenacin (VESICARE) 5 MG tablet Take 1 tablet (5 mg total) by mouth daily. 90 tablet 0  . sucralfate (CARAFATE) 1 g tablet sucralfate 1 gram tablet    . topiramate (TOPAMAX) 100 MG tablet topiramate 100 mg tablet  TAKE 1 TABLET BY MOUTH 2 TIMES DAILY.    . budesonide-formoterol (SYMBICORT) 160-4.5 MCG/ACT inhaler Symbicort 160 mcg-4.5 mcg/actuation HFA aerosol inhaler    . oxyCODONE-acetaminophen (PERCOCET/ROXICET) 5-325 MG tablet Take 1-2 tablets by mouth every 4 (four) hours as needed for moderate pain. (Patient not taking: Reported on 07/01/2018) 40 tablet 0  . valACYclovir (VALTREX) 500 MG tablet valacyclovir 500 mg tablet  1 daily     No current facility-administered medications on file prior to visit.     Allergies  Allergen Reactions  . Latex Anaphylaxis and Rash  . Prasterone Other (See Comments) and Nausea And Vomiting    rash Other reaction(s): Headache Headaches.  . Shellfish-Derived Products Hives, Other (See Comments), Rash and Swelling    Facial swelling Uncoded Allergy. Allergen: seafood Uncoded Allergy. Allergen: CATS, Other Reaction: itch, wheezing Uncoded Allergy. Allergen: COMPAZINE, Other Reaction: tremors Facial swelling Facial swelling Uncoded Allergy. Allergen: seafood Uncoded Allergy. Allergen: CATS, Other Reaction: itch, wheezing Uncoded Allergy. Allergen: COMPAZINE, Other Reaction: tremors Facial swelling Uncoded Allergy. Allergen:  seafood Uncoded Allergy. Allergen: CATS, Other Reaction: itch, wheezing Uncoded Allergy. Allergen: COMPAZINE, Other Reaction: tremors Facial swelling  . Bee Venom Itching  . Compazine [Prochlorperazine Edisylate] Other (See Comments)    tremors  . Dhea [Nutritional Supplements] Other (See Comments)    Headaches.  . Dilaudid [Hydromorphone Hcl]     ? reaction  . Lactose Intolerance (Gi)   . Other   . Prochlorperazine     Other reaction(s): Other (See Comments) ticks  . Promethazine     Other reaction(s): Other (See Comments) Ticks  . Promethazine Hcl Other (See Comments)    CNS disorder  . Reclast [Zoledronic Acid]     Weakness  could not move limbs, fatigue, increase sleep  . Clarithromycin Hives and Rash  . Fish Allergy Hives, Swelling and Rash    Facial swelling   . Hydromorphone Hcl Rash and Hives    "Rash all over"  . Penicillins Rash and Other (See Comments)    Has patient had a PCN reaction causing immediate rash, facial/tongue/throat swelling, SOB or lightheadedness with hypotension:No Has patient had a PCN reaction causing severe rash involving mucus membranes or skin necrosis:No Has patient had a PCN reaction that required hospitalization:No Has patient had a PCN reaction occurring within the last 10 years:No If all of the above answers are "NO", then may proceed with Cephalosporin use.      Review of Systems ROS Review of Systems - General ROS: negative for - chills, fatigue, fever, hot flashes, night sweats, weight gain or weight loss Psychological ROS: negative for - anxiety, decreased libido, depression, mood swings, physical abuse or sexual abuse Ophthalmic ROS: negative for - blurry vision, eye pain or loss of vision ENT ROS: negative for - headaches, hearing change, visual changes or vocal changes Allergy and Immunology ROS: negative for - hives, itchy/watery eyes or seasonal allergies Hematological and Lymphatic ROS: negative for - bleeding problems,  bruising, swollen lymph nodes or weight loss Endocrine ROS: negative for - galactorrhea, hot flashes, malaise/lethargy, mood swings, palpitations, polydipsia/polyuria, skin changes, temperature intolerance or unexpected weight changes. Positive for  hair pattern changes (hair loss, including body hair and thinning of hair on head) Breast ROS: negative for - new or changing breast lumps or nipple discharge Respiratory ROS: negative for - cough or shortness of breath Cardiovascular ROS: negative for - chest pain, irregular heartbeat, palpitations or shortness of breath Gastrointestinal ROS: no abdominal pain, change in bowel habits, or black or bloody stools Genito-Urinary ROS: no dysuria, trouble voiding, or hematuria Musculoskeletal ROS: negative for - joint pain or joint stiffness Neurological ROS: negative for - bowel and bladder control changes Dermatological ROS: negative for rash and skin lesion changes   Objective:   BP 112/63   Pulse 88   Ht 5\' 10"  (1.778 m)   Wt 227 lb (103 kg)   LMP 01/02/2016 (Exact Date)   BMI 32.57 kg/m  CONSTITUTIONAL: Well-developed, well-nourished female in no acute distress. Mild obesity PSYCHIATRIC: Normal mood and affect. Normal behavior. Normal judgment and thought content. [place]: Alert and oriented to person, place, and time. Normal muscle tone coordination. No cranial nerve deficit noted. HENT:  Normocephalic, atraumatic, External right and left ear normal. Oropharynx is clear and moist EYES: Conjunctivae and EOM are normal. Pupils are equal, round, and reactive to light. No scleral icterus.  NECK: Normal range of motion, supple, no masses.  Normal thyroid.  SKIN: Skin is warm and dry. No rash noted. Not diaphoretic. No erythema. No pallor. CARDIOVASCULAR: Normal heart rate noted, regular rhythm, no murmur. RESPIRATORY: Clear to auscultation bilaterally. Effort and breath sounds normal, no problems with respiration noted. BREASTS: Symmetric in  size. No masses, skin changes, nipple drainage, or lymphadenopathy. ABDOMEN: Soft, normal bowel sounds, no distention noted.  No tenderness, rebound or guarding.  BLADDER: Normal PELVIC:  Bladder no bladder distension noted  Urethra: normal appearing urethra with no masses, tenderness or lesions  Vulva: normal appearing vulva with no masses, tenderness or lesions  Vagina: atrophic and Pelvic Floor Exam no cystocele, rectocele or prolapse noted, cystocele mild, present  Cervix: normal appearing cervix without discharge or lesions  Uterus: uterus is normal size, shape, consistency and nontender  Adnexa: normal adnexa in size, nontender and no masses  RV: External Exam NormaI, No Rectal Masses and Normal Sphincter tone  MUSCULOSKELETAL: Normal range of motion. No tenderness.  No cyanosis, clubbing, or edema.  2+ distal pulses. LYMPHATIC: No Axillary, Supraclavicular, or Inguinal Adenopathy.   Labs: Lab Results  Component Value Date   WBC 4.9 05/26/2018   HGB 12.4 05/26/2018   HCT 38.2 05/26/2018   MCV 89.9 05/26/2018   PLT 140 (L) 05/26/2018    Lab Results  Component Value Date   CREATININE 0.86 05/26/2018   BUN 13 05/26/2018   NA 141 05/26/2018   K 3.5 05/26/2018   CL 109 05/26/2018   CO2 25 05/26/2018    Lab Results  Component Value Date   ALT 24 03/29/2018   AST 27 03/29/2018   ALKPHOS 82 03/29/2018   BILITOT 0.5 03/29/2018    Lab Results  Component Value Date   CHOL 153 11/05/2017   HDL 72.30 11/05/2017   LDLCALC 63 11/05/2017   TRIG 88.0 11/05/2017   CHOLHDL 2 11/05/2017    Lab Results  Component Value Date   TSH 3.92 11/05/2017    Lab Results  Component Value Date   HGBA1C 4.9 05/28/2014     Assessment:   Annual gynecologic examination 52 y.o. Cervical cancer screening  Vasomotor symptoms due to menopause History of thyroid disease Screening for lipid disorders Menopausal vasomotor syndrome Hair loss Lupus   Plan:  Pap: Pap Co Test  performed today.  Mammogram: Up to date.  Continue routine screening Stool Guaiac Testing:  Not Indicated. Patient has had recent colonoscopy last year, repeat due in another 4 years.  Labs: Lipid 1, FBS and TSH and Vitamin D level. To have performed at future visit as she is also expected to have labs formed with her Psychiatrist within the next month.  Routine preventative health maintenance measures emphasized: Exercise/Diet/Weight control and Stress Management Given refill on Paxil for management of vasomotor symptoms.  Discussed that hair loss could be a function of her lupus, hormones can lead to some hair thinning, as well as thyroid disease.  It may be a combination of her conditions leading to her symptoms. Discussed that if thinning becomes bothersome, can consider hair growth vitamins or other OTC or prescribed hair restoration products to help.  Return to [place], MD [facility]

## 2018-07-01 NOTE — Patient Instructions (Signed)
Health Maintenance for Postmenopausal Women Menopause is a normal process in which your reproductive ability comes to an end. This process happens gradually over a span of months to years, usually between the ages of 22 and 9. Menopause is complete when you have missed 12 consecutive menstrual periods. It is important to talk with your health care provider about some of the most common conditions that affect postmenopausal women, such as heart disease, cancer, and bone loss (osteoporosis). Adopting a healthy lifestyle and getting preventive care can help to promote your health and wellness. Those actions can also lower your chances of developing some of these common conditions. What should I know about menopause? During menopause, you may experience a number of symptoms, such as:  Moderate-to-severe hot flashes.  Night sweats.  Decrease in sex drive.  Mood swings.  Headaches.  Tiredness.  Irritability.  Memory problems.  Insomnia.  Choosing to treat or not to treat menopausal changes is an individual decision that you make with your health care provider. What should I know about hormone replacement therapy and supplements? Hormone therapy products are effective for treating symptoms that are associated with menopause, such as hot flashes and night sweats. Hormone replacement carries certain risks, especially as you become older. If you are thinking about using estrogen or estrogen with progestin treatments, discuss the benefits and risks with your health care provider. What should I know about heart disease and stroke? Heart disease, heart attack, and stroke become more likely as you age. This may be due, in part, to the hormonal changes that your body experiences during menopause. These can affect how your body processes dietary fats, triglycerides, and cholesterol. Heart attack and stroke are both medical emergencies. There are many things that you can do to help prevent heart disease  and stroke:  Have your blood pressure checked at least every 1-2 years. High blood pressure causes heart disease and increases the risk of stroke.  If you are 53-22 years old, ask your health care provider if you should take aspirin to prevent a heart attack or a stroke.  Do not use any tobacco products, including cigarettes, chewing tobacco, or electronic cigarettes. If you need help quitting, ask your health care provider.  It is important to eat a healthy diet and maintain a healthy weight. ? Be sure to include plenty of vegetables, fruits, low-fat dairy products, and lean protein. ? Avoid eating foods that are high in solid fats, added sugars, or salt (sodium).  Get regular exercise. This is one of the most important things that you can do for your health. ? Try to exercise for at least 150 minutes each week. The type of exercise that you do should increase your heart rate and make you sweat. This is known as moderate-intensity exercise. ? Try to do strengthening exercises at least twice each week. Do these in addition to the moderate-intensity exercise.  Know your numbers.Ask your health care provider to check your cholesterol and your blood glucose. Continue to have your blood tested as directed by your health care provider.  What should I know about cancer screening? There are several types of cancer. Take the following steps to reduce your risk and to catch any cancer development as early as possible. Breast Cancer  Practice breast self-awareness. ? This means understanding how your breasts normally appear and feel. ? It also means doing regular breast self-exams. Let your health care provider know about any changes, no matter how small.  If you are 40  or older, have a clinician do a breast exam (clinical breast exam or CBE) every year. Depending on your age, family history, and medical history, it may be recommended that you also have a yearly breast X-ray (mammogram).  If you  have a family history of breast cancer, talk with your health care provider about genetic screening.  If you are at high risk for breast cancer, talk with your health care provider about having an MRI and a mammogram every year.  Breast cancer (BRCA) gene test is recommended for women who have family members with BRCA-related cancers. Results of the assessment will determine the need for genetic counseling and BRCA1 and for BRCA2 testing. BRCA-related cancers include these types: ? Breast. This occurs in males or females. ? Ovarian. ? Tubal. This may also be called fallopian tube cancer. ? Cancer of the abdominal or pelvic lining (peritoneal cancer). ? Prostate. ? Pancreatic.  Cervical, Uterine, and Ovarian Cancer Your health care provider may recommend that you be screened regularly for cancer of the pelvic organs. These include your ovaries, uterus, and vagina. This screening involves a pelvic exam, which includes checking for microscopic changes to the surface of your cervix (Pap test).  For women ages 21-65, health care providers may recommend a pelvic exam and a Pap test every three years. For women ages 79-65, they may recommend the Pap test and pelvic exam, combined with testing for human papilloma virus (HPV), every five years. Some types of HPV increase your risk of cervical cancer. Testing for HPV may also be done on women of any age who have unclear Pap test results.  Other health care providers may not recommend any screening for nonpregnant women who are considered low risk for pelvic cancer and have no symptoms. Ask your health care provider if a screening pelvic exam is right for you.  If you have had past treatment for cervical cancer or a condition that could lead to cancer, you need Pap tests and screening for cancer for at least 20 years after your treatment. If Pap tests have been discontinued for you, your risk factors (such as having a new sexual partner) need to be  reassessed to determine if you should start having screenings again. Some women have medical problems that increase the chance of getting cervical cancer. In these cases, your health care provider may recommend that you have screening and Pap tests more often.  If you have a family history of uterine cancer or ovarian cancer, talk with your health care provider about genetic screening.  If you have vaginal bleeding after reaching menopause, tell your health care provider.  There are currently no reliable tests available to screen for ovarian cancer.  Lung Cancer Lung cancer screening is recommended for adults 69-62 years old who are at high risk for lung cancer because of a history of smoking. A yearly low-dose CT scan of the lungs is recommended if you:  Currently smoke.  Have a history of at least 30 pack-years of smoking and you currently smoke or have quit within the past 15 years. A pack-year is smoking an average of one pack of cigarettes per day for one year.  Yearly screening should:  Continue until it has been 15 years since you quit.  Stop if you develop a health problem that would prevent you from having lung cancer treatment.  Colorectal Cancer  This type of cancer can be detected and can often be prevented.  Routine colorectal cancer screening usually begins at  age 42 and continues through age 45.  If you have risk factors for colon cancer, your health care provider may recommend that you be screened at an earlier age.  If you have a family history of colorectal cancer, talk with your health care provider about genetic screening.  Your health care provider may also recommend using home test kits to check for hidden blood in your stool.  A small camera at the end of a tube can be used to examine your colon directly (sigmoidoscopy or colonoscopy). This is done to check for the earliest forms of colorectal cancer.  Direct examination of the colon should be repeated every  5-10 years until age 71. However, if early forms of precancerous polyps or small growths are found or if you have a family history or genetic risk for colorectal cancer, you may need to be screened more often.  Skin Cancer  Check your skin from head to toe regularly.  Monitor any moles. Be sure to tell your health care provider: ? About any new moles or changes in moles, especially if there is a change in a mole's shape or color. ? If you have a mole that is larger than the size of a pencil eraser.  If any of your family members has a history of skin cancer, especially at a young age, talk with your health care provider about genetic screening.  Always use sunscreen. Apply sunscreen liberally and repeatedly throughout the day.  Whenever you are outside, protect yourself by wearing long sleeves, pants, a wide-brimmed hat, and sunglasses.  What should I know about osteoporosis? Osteoporosis is a condition in which bone destruction happens more quickly than new bone creation. After menopause, you may be at an increased risk for osteoporosis. To help prevent osteoporosis or the bone fractures that can happen because of osteoporosis, the following is recommended:  If you are 46-71 years old, get at least 1,000 mg of calcium and at least 600 mg of vitamin D per day.  If you are older than age 55 but younger than age 65, get at least 1,200 mg of calcium and at least 600 mg of vitamin D per day.  If you are older than age 54, get at least 1,200 mg of calcium and at least 800 mg of vitamin D per day.  Smoking and excessive alcohol intake increase the risk of osteoporosis. Eat foods that are rich in calcium and vitamin D, and do weight-bearing exercises several times each week as directed by your health care provider. What should I know about how menopause affects my mental health? Depression may occur at any age, but it is more common as you become older. Common symptoms of depression  include:  Low or sad mood.  Changes in sleep patterns.  Changes in appetite or eating patterns.  Feeling an overall lack of motivation or enjoyment of activities that you previously enjoyed.  Frequent crying spells.  Talk with your health care provider if you think that you are experiencing depression. What should I know about immunizations? It is important that you get and maintain your immunizations. These include:  Tetanus, diphtheria, and pertussis (Tdap) booster vaccine.  Influenza every year before the flu season begins.  Pneumonia vaccine.  Shingles vaccine.  Your health care provider may also recommend other immunizations. This information is not intended to replace advice given to you by your health care provider. Make sure you discuss any questions you have with your health care provider. Document Released: 01/08/2006  Document Revised: 06/05/2016 Document Reviewed: 08/20/2015 Elsevier Interactive Patient Education  2018 [employer]  

## 2018-07-01 NOTE — Progress Notes (Signed)
Pt is present today for annual exam. Pt stated that she is doing well no complaints.

## 2018-07-03 ENCOUNTER — Other Ambulatory Visit: Payer: Self-pay

## 2018-07-03 ENCOUNTER — Inpatient Hospital Stay
Admission: EM | Admit: 2018-07-03 | Discharge: 2018-07-04 | DRG: 313 | Disposition: A | Discharge status: Home / Self Care | Payer: Medicare Other | Attending: Internal Medicine | Admitting: Internal Medicine

## 2018-07-03 ENCOUNTER — Encounter: Payer: Self-pay | Admitting: Emergency Medicine

## 2018-07-03 ENCOUNTER — Emergency Department: Payer: Medicare Other

## 2018-07-03 DIAGNOSIS — Z91030 Bee allergy status: Secondary | ICD-10-CM

## 2018-07-03 DIAGNOSIS — J45909 Unspecified asthma, uncomplicated: Secondary | ICD-10-CM | POA: Diagnosis present

## 2018-07-03 DIAGNOSIS — Z91040 Latex allergy status: Secondary | ICD-10-CM

## 2018-07-03 DIAGNOSIS — Z79899 Other long term (current) drug therapy: Secondary | ICD-10-CM

## 2018-07-03 DIAGNOSIS — Z91013 Allergy to seafood: Secondary | ICD-10-CM

## 2018-07-03 DIAGNOSIS — R079 Chest pain, unspecified: Secondary | ICD-10-CM | POA: Diagnosis present

## 2018-07-03 DIAGNOSIS — Z881 Allergy status to other antibiotic agents status: Secondary | ICD-10-CM

## 2018-07-03 DIAGNOSIS — Z888 Allergy status to other drugs, medicaments and biological substances status: Secondary | ICD-10-CM

## 2018-07-03 DIAGNOSIS — F419 Anxiety disorder, unspecified: Secondary | ICD-10-CM | POA: Diagnosis present

## 2018-07-03 DIAGNOSIS — Z7983 Long term (current) use of bisphosphonates: Secondary | ICD-10-CM

## 2018-07-03 DIAGNOSIS — M329 Systemic lupus erythematosus, unspecified: Secondary | ICD-10-CM | POA: Diagnosis present

## 2018-07-03 DIAGNOSIS — M810 Age-related osteoporosis without current pathological fracture: Secondary | ICD-10-CM | POA: Diagnosis present

## 2018-07-03 DIAGNOSIS — G8929 Other chronic pain: Secondary | ICD-10-CM | POA: Diagnosis present

## 2018-07-03 DIAGNOSIS — G250 Essential tremor: Secondary | ICD-10-CM | POA: Diagnosis present

## 2018-07-03 DIAGNOSIS — Z7952 Long term (current) use of systemic steroids: Secondary | ICD-10-CM

## 2018-07-03 DIAGNOSIS — Z880 Allergy status to penicillin: Secondary | ICD-10-CM

## 2018-07-03 DIAGNOSIS — Z885 Allergy status to narcotic agent status: Secondary | ICD-10-CM

## 2018-07-03 DIAGNOSIS — Z800 Family history of malignant neoplasm of digestive organs: Secondary | ICD-10-CM

## 2018-07-03 DIAGNOSIS — Z803 Family history of malignant neoplasm of breast: Secondary | ICD-10-CM

## 2018-07-03 DIAGNOSIS — K219 Gastro-esophageal reflux disease without esophagitis: Secondary | ICD-10-CM | POA: Diagnosis present

## 2018-07-03 DIAGNOSIS — E039 Hypothyroidism, unspecified: Secondary | ICD-10-CM | POA: Diagnosis present

## 2018-07-03 DIAGNOSIS — Z79890 Hormone replacement therapy (postmenopausal): Secondary | ICD-10-CM

## 2018-07-03 DIAGNOSIS — F909 Attention-deficit hyperactivity disorder, unspecified type: Secondary | ICD-10-CM | POA: Diagnosis present

## 2018-07-03 DIAGNOSIS — Z825 Family history of asthma and other chronic lower respiratory diseases: Secondary | ICD-10-CM

## 2018-07-03 DIAGNOSIS — Z87892 Personal history of anaphylaxis: Secondary | ICD-10-CM

## 2018-07-03 DIAGNOSIS — R0789 Other chest pain: Principal | ICD-10-CM | POA: Diagnosis present

## 2018-07-03 DIAGNOSIS — F329 Major depressive disorder, single episode, unspecified: Secondary | ICD-10-CM | POA: Diagnosis present

## 2018-07-03 DIAGNOSIS — Z96641 Presence of right artificial hip joint: Secondary | ICD-10-CM | POA: Diagnosis present

## 2018-07-03 DIAGNOSIS — Z7951 Long term (current) use of inhaled steroids: Secondary | ICD-10-CM

## 2018-07-03 DIAGNOSIS — E739 Lactose intolerance, unspecified: Secondary | ICD-10-CM | POA: Diagnosis present

## 2018-07-03 DIAGNOSIS — G629 Polyneuropathy, unspecified: Secondary | ICD-10-CM | POA: Diagnosis present

## 2018-07-03 DIAGNOSIS — Z8541 Personal history of malignant neoplasm of cervix uteri: Secondary | ICD-10-CM

## 2018-07-03 LAB — BASIC METABOLIC PANEL
ANION GAP: 7 (ref 5–15)
BUN: 13 mg/dL (ref 6–20)
CHLORIDE: 115 mmol/L — AB (ref 98–111)
CO2: 24 mmol/L (ref 22–32)
Calcium: 8.8 mg/dL — ABNORMAL LOW (ref 8.9–10.3)
Creatinine, Ser: 0.86 mg/dL (ref 0.44–1.00)
Glucose, Bld: 108 mg/dL — ABNORMAL HIGH (ref 70–99)
Potassium: 3.8 mmol/L (ref 3.5–5.1)
Sodium: 146 mmol/L — ABNORMAL HIGH (ref 135–145)

## 2018-07-03 LAB — CBC
HEMATOCRIT: 36.9 % (ref 35.0–47.0)
HEMOGLOBIN: 12.1 g/dL (ref 12.0–16.0)
MCH: 29.4 pg (ref 26.0–34.0)
MCHC: 32.9 g/dL (ref 32.0–36.0)
MCV: 89.3 fL (ref 80.0–100.0)
Platelets: 135 10*3/uL — ABNORMAL LOW (ref 150–440)
RBC: 4.13 MIL/uL (ref 3.80–5.20)
RDW: 15 % — ABNORMAL HIGH (ref 11.5–14.5)
WBC: 4.3 10*3/uL (ref 3.6–11.0)

## 2018-07-03 LAB — TROPONIN I
Troponin I: <0.03 ng/mL (ref <0.03)
Troponin I: <0.03 ng/mL (ref <0.03)

## 2018-07-03 MED ORDER — NITROGLYCERIN 0.4 MG SL SUBL
0.4000 mg | SUBLINGUAL_TABLET | SUBLINGUAL | Status: DC | PRN
  Administered 2018-07-03 – 2018-07-04 (×5): 0.4 mg via SUBLINGUAL
  Filled 2018-07-03: qty 1

## 2018-07-03 MED ORDER — NITROGLYCERIN 0.4 MG SL SUBL
SUBLINGUAL_TABLET | SUBLINGUAL | Status: AC
  Administered 2018-07-03: 0.4 mg via SUBLINGUAL
  Filled 2018-07-03: qty 1

## 2018-07-03 NOTE — ED Triage Notes (Signed)
Pt arrives POV to triage with c/o central chest pain since 1800. Pt reports taking nitro x 1 at home with no relief. Pt reports nausea at this time.

## 2018-07-03 NOTE — ED Notes (Addendum)
Dr Benard Halsted, cardiologist  Hx of lupus, CP with N and dizzy "on and off for the last few months"  Last nitrostat at 1800

## 2018-07-03 NOTE — ED Provider Notes (Addendum)
[facility] Emergency Department Provider Note       Time seen: ----------------------------------------- 9:57 PM on 07/03/2018 -----------------------------------------   I have reviewed the triage vital signs and the nursing notes.  HISTORY   Chief Complaint Chest Pain    HPI [name] [name] is a 52 y.o. female with a history of ADHD, anemia, arthritis, asthma, chronic pain, fibromyalgia, hypothyroidism who presents to the ED for central chest pain that began around 6 PM tonight.  Patient reports taking nitroglycerin at home with no relief.  She does report some nausea at this time.  Patient's pain was resolved after nitroglycerin, she was seen here similarly for same.  She recently has had her Imdur dose increased from 30 to 60 mg.  She had some shortness of breath associated with it and her symptoms have resolved at this time.  Past Medical History:  Diagnosis Date  . ADHD   . Anemia   . ANXIETY 06/06/2007  . Arthritis    related to lupus  . Asthma   . BACK PAIN 02/22/2009  . Cervical cancer ([place])    LEEP 2005   . Chicken pox   . Chronic pain    goes to pain management provider  . Collagen vascular disease ([place])   . DEPRESSION 06/06/2007  . Essential tremor   . Fibromyalgia   . Fibromyalgia   . FREQUENCY, URINARY 07/07/2007  . Gastritis   . GERD 06/06/2007  . Glaucoma   . Headache    h/o migraines   . History of hiatal hernia   . History of kidney stones   . HYPOTHYROIDISM 06/06/2007  . INTERSTITIAL CYSTITIS 09/11/2010  . Lupus ([place]) 2006  . Lupus ([place])   . Menopause   . Migraine   . Mitral valve regurgitation   . NEPHROLITHIASIS, HX OF 11/05/2010  . Neuropathy   . Neuropathy   . OCD (obsessive compulsive disorder)   . Optic neuritis    2005  . OSTEOPENIA 10/14/2009  . Osteoporosis   . OVERACTIVE BLADDER 10/14/2009  . PAIN, CHRONIC NEC 06/06/2007  . Panniculitis   . POLYARTHRITIS 08/19/2007  . Raynaud's disease   . Sicca syndrome  ([place])   . 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 Vaginal prolapse    Dr. Marcelline Mates [place]   . Vasculitis ([place])   . WEIGHT GAIN 02/22/2009    Patient Active Problem List   Diagnosis Date Noted  . Allergic rhinitis 12/16/2017  . Dizziness 12/16/2017  . Orthostatic hypotension 12/16/2017  . Generalized abdominal pain 12/16/2017  . Constipation 12/16/2017  . Acute nonintractable headache 12/13/2017  . Fibromyalgia 11/04/2017  . Status post hardware removal 02/16/2017  . Sicca syndrome ([place]) 06/04/2014  . Abdominal pain, epigastric 09/08/2013  . Type II or unspecified type diabetes mellitus without mention of complication, uncontrolled 03/15/2013  . UTI (urinary tract infection) 02/15/2013  . Chronic pain 03/31/2011  . NEPHROLITHIASIS, HX OF 11/05/2010  . INTERSTITIAL CYSTITIS 09/11/2010  . Overactive bladder 10/14/2009  . Disorder of bone and cartilage 10/14/2009  . Backache 02/22/2009  . WEIGHT GAIN 02/22/2009  . 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 POLYARTHRITIS 08/19/2007  . FREQUENCY, URINARY 07/07/2007  . Hypothyroidism 06/06/2007  . Anxiety state 06/06/2007  . Depression, recurrent ([place]) 06/06/2007  . PAIN, CHRONIC NEC 06/06/2007  . GERD 06/06/2007    Past Surgical History:  Procedure Laterality Date  . APPENDECTOMY    . CERVICAL BIOPSY  W/ LOOP ELECTRODE EXCISION  2005  . CHOLECYSTECTOMY    .  ESOPHAGOGASTRODUODENOSCOPY (EGD) WITH PROPOFOL N/A 08/27/2017   Procedure: ESOPHAGOGASTRODUODENOSCOPY (EGD) WITH PROPOFOL;  Surgeon: Manya Silvas, MD;  Location: [facility] ENDOSCOPY;  Service: Endoscopy;  Laterality: N/A;  . HAND SURGERY     repair of left metatarsal   . HARDWARE REMOVAL Right 02/16/2017   Procedure: HARDWARE REMOVAL FROM HIP;  Surgeon: Hessie Knows, MD;  Location: [facility] ORS;  Service: Orthopedics;  Laterality: Right;  . JOINT REPLACEMENT    . OTHER SURGICAL HISTORY     left 3rd metatarsal fracture repair 2011   . REVISION TOTAL HIP ARTHROPLASTY  06/2009    replacement 2010 then screw and plate removal 0488; right hip  . TONSILLECTOMY    . TONSILLECTOMY    . wrist  ligament repair bilateral    . WRIST RECONSTRUCTION    . WRIST SURGERY     laceration of right wrist ligaments  . wrist surgery     laceration of left wrist surgery     Allergies Latex; Prasterone; Shellfish-derived products; Bee venom; Compazine [prochlorperazine edisylate]; Dhea [nutritional supplements]; Dilaudid [hydromorphone hcl]; Lactose intolerance (gi); Other; Prochlorperazine; Promethazine; Promethazine hcl; Reclast [zoledronic acid]; Clarithromycin; Fish allergy; Hydromorphone hcl; and Penicillins  Social History Social History   Tobacco Use  . Smoking status: Never Smoker  . Smokeless tobacco: Never Used  Substance Use Topics  . Alcohol use: No  . Drug use: No   Review of Systems Constitutional: Negative for fever. Cardiovascular: Positive for chest pain Respiratory: Positive for recent shortness of breath Gastrointestinal: Negative for abdominal pain, vomiting and diarrhea. Musculoskeletal: Negative for back pain. Skin: Negative for rash. Neurological: Negative for headaches, focal weakness or numbness.  All systems negative/normal/unremarkable except as stated in the HPI  ____________________________________________   PHYSICAL EXAM:  VITAL SIGNS: ED Triage Vitals  Enc Vitals Group     BP 07/03/18 2104 (!) 133/57     Pulse Rate 07/03/18 2104 93     Resp 07/03/18 2104 18     Temp 07/03/18 2104 97.6 F (36.4 C)     Temp Source 07/03/18 2104 Oral     SpO2 07/03/18 2104 96 %     Weight 07/03/18 2101 227 lb (103 kg)     Height 07/03/18 2101 5\' 10"  (1.778 m)     Head Circumference --      Peak Flow --      Pain Score 07/03/18 2101 9     Pain Loc --      Pain Edu? --      Excl. in [place]? --    Constitutional: Alert and oriented.  Chronically ill-appearing, no distress Eyes: Conjunctivae are normal. Normal extraocular movements. ENT    Head: Normocephalic and atraumatic.   Nose: No congestion/rhinnorhea.   Mouth/Throat: Mucous membranes are moist.   Neck: No stridor. Cardiovascular: Normal rate, regular rhythm. No murmurs, rubs, or gallops. Respiratory: Normal respiratory effort without tachypnea nor retractions. Breath sounds are clear and equal bilaterally. No wheezes/rales/rhonchi. Gastrointestinal: Soft and nontender. Normal bowel sounds Musculoskeletal: Nontender with normal range of motion in extremities. No lower extremity tenderness nor edema. Neurologic:  Normal speech and language. No gross focal neurologic deficits are appreciated.  Skin:  Skin is warm, dry and intact. No rash noted. Psychiatric: Mood and affect are normal. Speech and behavior are normal.  ____________________________________________  EKG: Interpreted by me.  Sinus rhythm rate 83 bpm, normal PR interval, normal QRS, normal QT  ____________________________________________  ED COURSE:  As part of my medical decision making, I reviewed the  following data within the [place] History obtained from family if available, nursing notes, old chart and ekg, as well as notes from prior ED visits. Patient presented for chest pain, we will assess with labs and imaging as indicated at this time.   Procedures ____________________________________________   LABS (pertinent positives/negatives)  Labs Reviewed  BASIC METABOLIC PANEL - Abnormal; Notable for the following components:      Result Value   Sodium 146 (*)    Chloride 115 (*)    Glucose, Bld 108 (*)    Calcium 8.8 (*)    All other components within normal limits  CBC - Abnormal; Notable for the following components:   RDW 15.0 (*)    Platelets 135 (*)    All other components within normal limits  TROPONIN I  TROPONIN I    RADIOLOGY  Chest x-ray is normal  ____________________________________________  DIFFERENTIAL DIAGNOSIS   Unstable angina,  musculoskeletal pain, lupus, PE, pneumonia  FINAL ASSESSMENT AND PLAN  Chest pain   Plan: The patient had presented for specific chest pain. Patient's labs were negative including repeat troponin. Patient's imaging was negative.  I had initially planned on letting the patient go home and following up with her cardiologist tomorrow but the pain came back requiring more nitroglycerin.  I will discuss with the hospitalist for admission with likely cardiology consultation.  She may need a cardiac catheterization.   Laurence Aly, MD   Note: This note was generated in part or whole with voice recognition software. Voice recognition is usually quite accurate but there are transcription errors that can and very often do occur. I apologize for any typographical errors that were not detected and corrected.     Earleen Newport, MD 07/03/18 2213    Earleen Newport, MD 07/03/18 2256

## 2018-07-03 NOTE — ED Notes (Signed)
Fentanyl 159mcg patch on chest applied 1900 Saturday, Q 72 hr patch

## 2018-07-04 DIAGNOSIS — F909 Attention-deficit hyperactivity disorder, unspecified type: Secondary | ICD-10-CM | POA: Diagnosis present

## 2018-07-04 DIAGNOSIS — F329 Major depressive disorder, single episode, unspecified: Secondary | ICD-10-CM | POA: Diagnosis present

## 2018-07-04 DIAGNOSIS — Z87892 Personal history of anaphylaxis: Secondary | ICD-10-CM | POA: Diagnosis not present

## 2018-07-04 DIAGNOSIS — Z803 Family history of malignant neoplasm of breast: Secondary | ICD-10-CM | POA: Diagnosis not present

## 2018-07-04 DIAGNOSIS — Z79899 Other long term (current) drug therapy: Secondary | ICD-10-CM | POA: Diagnosis not present

## 2018-07-04 DIAGNOSIS — Z885 Allergy status to narcotic agent status: Secondary | ICD-10-CM | POA: Diagnosis not present

## 2018-07-04 DIAGNOSIS — G8929 Other chronic pain: Secondary | ICD-10-CM | POA: Diagnosis present

## 2018-07-04 DIAGNOSIS — R0602 Shortness of breath: Secondary | ICD-10-CM | POA: Diagnosis not present

## 2018-07-04 DIAGNOSIS — Z800 Family history of malignant neoplasm of digestive organs: Secondary | ICD-10-CM | POA: Diagnosis not present

## 2018-07-04 DIAGNOSIS — M329 Systemic lupus erythematosus, unspecified: Secondary | ICD-10-CM | POA: Diagnosis not present

## 2018-07-04 DIAGNOSIS — G629 Polyneuropathy, unspecified: Secondary | ICD-10-CM | POA: Diagnosis present

## 2018-07-04 DIAGNOSIS — J45909 Unspecified asthma, uncomplicated: Secondary | ICD-10-CM | POA: Diagnosis present

## 2018-07-04 DIAGNOSIS — R079 Chest pain, unspecified: Secondary | ICD-10-CM | POA: Diagnosis present

## 2018-07-04 DIAGNOSIS — Z79890 Hormone replacement therapy (postmenopausal): Secondary | ICD-10-CM | POA: Diagnosis not present

## 2018-07-04 DIAGNOSIS — Z880 Allergy status to penicillin: Secondary | ICD-10-CM | POA: Diagnosis not present

## 2018-07-04 DIAGNOSIS — R0789 Other chest pain: Secondary | ICD-10-CM | POA: Diagnosis present

## 2018-07-04 DIAGNOSIS — Z7952 Long term (current) use of systemic steroids: Secondary | ICD-10-CM | POA: Diagnosis not present

## 2018-07-04 DIAGNOSIS — Z7951 Long term (current) use of inhaled steroids: Secondary | ICD-10-CM | POA: Diagnosis not present

## 2018-07-04 DIAGNOSIS — F419 Anxiety disorder, unspecified: Secondary | ICD-10-CM | POA: Diagnosis not present

## 2018-07-04 DIAGNOSIS — Z881 Allergy status to other antibiotic agents status: Secondary | ICD-10-CM | POA: Diagnosis not present

## 2018-07-04 DIAGNOSIS — R9439 Abnormal result of other cardiovascular function study: Secondary | ICD-10-CM | POA: Diagnosis not present

## 2018-07-04 DIAGNOSIS — Z91013 Allergy to seafood: Secondary | ICD-10-CM | POA: Diagnosis not present

## 2018-07-04 DIAGNOSIS — Z91030 Bee allergy status: Secondary | ICD-10-CM | POA: Diagnosis not present

## 2018-07-04 DIAGNOSIS — G250 Essential tremor: Secondary | ICD-10-CM | POA: Diagnosis present

## 2018-07-04 DIAGNOSIS — Z7983 Long term (current) use of bisphosphonates: Secondary | ICD-10-CM | POA: Diagnosis not present

## 2018-07-04 DIAGNOSIS — Z888 Allergy status to other drugs, medicaments and biological substances status: Secondary | ICD-10-CM | POA: Diagnosis not present

## 2018-07-04 DIAGNOSIS — Z91040 Latex allergy status: Secondary | ICD-10-CM | POA: Diagnosis not present

## 2018-07-04 LAB — CYTOLOGY - PAP
Diagnosis: NEGATIVE
HPV (WINDOPATH): NOT DETECTED

## 2018-07-04 LAB — GLUCOSE, CAPILLARY: GLUCOSE-CAPILLARY: 85 mg/dL (ref 70–99)

## 2018-07-04 LAB — BASIC METABOLIC PANEL
ANION GAP: 6 (ref 5–15)
BUN: 13 mg/dL (ref 6–20)
CHLORIDE: 113 mmol/L — AB (ref 98–111)
CO2: 25 mmol/L (ref 22–32)
Calcium: 8.5 mg/dL — ABNORMAL LOW (ref 8.9–10.3)
Creatinine, Ser: 0.75 mg/dL (ref 0.44–1.00)
GFR calc Af Amer: >60 mL/min (ref >60)
GLUCOSE: 104 mg/dL — AB (ref 70–99)
POTASSIUM: 3.7 mmol/L (ref 3.5–5.1)
Sodium: 144 mmol/L (ref 135–145)

## 2018-07-04 LAB — CBC
HEMATOCRIT: 35.1 % (ref 35.0–47.0)
Hemoglobin: 11.4 g/dL — ABNORMAL LOW (ref 12.0–16.0)
MCH: 29.2 pg (ref 26.0–34.0)
MCHC: 32.5 g/dL (ref 32.0–36.0)
MCV: 89.7 fL (ref 80.0–100.0)
Platelets: 127 10*3/uL — ABNORMAL LOW (ref 150–440)
RBC: 3.91 MIL/uL (ref 3.80–5.20)
RDW: 14.8 % — ABNORMAL HIGH (ref 11.5–14.5)
WBC: 4.2 10*3/uL (ref 3.6–11.0)

## 2018-07-04 MED ORDER — LIDOCAINE 5 % EX PTCH: 1.0000 | MEDICATED_PATCH | Freq: Two times a day (BID) | CUTANEOUS | Status: DC

## 2018-07-04 MED ORDER — MORPHINE SULFATE (PF) 2 MG/ML IV SOLN
INTRAVENOUS | Status: AC
  Administered 2018-07-04: 2 mg via INTRAVENOUS
  Filled 2018-07-04: qty 1

## 2018-07-04 MED ORDER — LORAZEPAM 1 MG PO TABS: 1.0000 mg | ORAL_TABLET | Freq: Every day | ORAL | Status: DC | PRN

## 2018-07-04 MED ORDER — FOLIC ACID 1 MG PO TABS
1.0000 mg | ORAL_TABLET | Freq: Every day | ORAL | Status: DC
  Administered 2018-07-04: 1 mg via ORAL
  Filled 2018-07-04: qty 1

## 2018-07-04 MED ORDER — ACETAMINOPHEN 650 MG RE SUPP: 650.0000 mg | Freq: Four times a day (QID) | RECTAL | Status: DC | PRN

## 2018-07-04 MED ORDER — BIOTIN 10 MG PO CAPS: 10.0000 mg | ORAL_CAPSULE | Freq: Every morning | ORAL | Status: DC

## 2018-07-04 MED ORDER — LORAZEPAM 1 MG PO TABS
1.0000 mg | ORAL_TABLET | Freq: Every day | ORAL | Status: DC
  Administered 2018-07-04: 1 mg via ORAL
  Filled 2018-07-04: qty 1

## 2018-07-04 MED ORDER — SUCRALFATE 1 G PO TABS
1.0000 g | ORAL_TABLET | Freq: Three times a day (TID) | ORAL | Status: DC
  Administered 2018-07-04 (×2): 1 g via ORAL
  Filled 2018-07-04 (×2): qty 1

## 2018-07-04 MED ORDER — TRAZODONE HCL 50 MG PO TABS
25.0000 mg | ORAL_TABLET | Freq: Every evening | ORAL | Status: DC | PRN
  Filled 2018-07-04: qty 1

## 2018-07-04 MED ORDER — ONDANSETRON HCL 4 MG PO TABS: 4.0000 mg | ORAL_TABLET | Freq: Four times a day (QID) | ORAL | Status: DC | PRN

## 2018-07-04 MED ORDER — LORAZEPAM 1 MG PO TABS: 1.0000 mg | ORAL_TABLET | ORAL | Status: DC

## 2018-07-04 MED ORDER — ONDANSETRON HCL 4 MG/2ML IJ SOLN
4.0000 mg | Freq: Four times a day (QID) | INTRAMUSCULAR | Status: DC | PRN
  Administered 2018-07-04 (×2): 4 mg via INTRAVENOUS
  Filled 2018-07-04 (×2): qty 2

## 2018-07-04 MED ORDER — HYDROXYCHLOROQUINE SULFATE 200 MG PO TABS
200.0000 mg | ORAL_TABLET | Freq: Two times a day (BID) | ORAL | Status: DC
  Administered 2018-07-04: 200 mg via ORAL
  Filled 2018-07-04 (×2): qty 1

## 2018-07-04 MED ORDER — IMIQUIMOD 5 % EX CREA: TOPICAL_CREAM | CUTANEOUS | Status: DC

## 2018-07-04 MED ORDER — POLYVINYL ALCOHOL-POVIDONE PF 1.4-0.6 % OP SOLN: 1.0000 [drp] | Freq: Two times a day (BID) | OPHTHALMIC | Status: DC

## 2018-07-04 MED ORDER — FENTANYL 75 MCG/HR TD PT72: 75.0000 ug | MEDICATED_PATCH | TRANSDERMAL | Status: DC

## 2018-07-04 MED ORDER — DULOXETINE HCL 30 MG PO CPEP
60.0000 mg | ORAL_CAPSULE | Freq: Every morning | ORAL | Status: DC
  Administered 2018-07-04: 60 mg via ORAL
  Filled 2018-07-04: qty 2

## 2018-07-04 MED ORDER — HYDROCODONE-ACETAMINOPHEN 5-325 MG PO TABS: 1.0000 | ORAL_TABLET | ORAL | Status: DC | PRN

## 2018-07-04 MED ORDER — VALACYCLOVIR HCL 500 MG PO TABS
500.0000 mg | ORAL_TABLET | Freq: Every day | ORAL | Status: DC
  Administered 2018-07-04: 500 mg via ORAL
  Filled 2018-07-04: qty 1

## 2018-07-04 MED ORDER — TOPIRAMATE 100 MG PO TABS
100.0000 mg | ORAL_TABLET | Freq: Two times a day (BID) | ORAL | Status: DC
  Administered 2018-07-04: 100 mg via ORAL
  Filled 2018-07-04 (×2): qty 1

## 2018-07-04 MED ORDER — ACETAZOLAMIDE 250 MG PO TABS
500.0000 mg | ORAL_TABLET | Freq: Two times a day (BID) | ORAL | Status: DC
  Administered 2018-07-04: 500 mg via ORAL
  Filled 2018-07-04 (×3): qty 2

## 2018-07-04 MED ORDER — VITAMIN C 500 MG PO TABS
2500.0000 mg | ORAL_TABLET | Freq: Every day | ORAL | Status: DC
  Administered 2018-07-04: 2500 mg via ORAL
  Filled 2018-07-04: qty 5

## 2018-07-04 MED ORDER — PANTOPRAZOLE SODIUM 40 MG PO TBEC
40.0000 mg | DELAYED_RELEASE_TABLET | Freq: Two times a day (BID) | ORAL | Status: DC
  Administered 2018-07-04 (×2): 40 mg via ORAL
  Filled 2018-07-04 (×2): qty 1

## 2018-07-04 MED ORDER — PAROXETINE HCL 10 MG PO TABS
10.0000 mg | ORAL_TABLET | Freq: Every day | ORAL | Status: DC
  Administered 2018-07-04: 10 mg via ORAL
  Filled 2018-07-04 (×2): qty 1

## 2018-07-04 MED ORDER — ASPIRIN EC 325 MG PO TBEC
325.0000 mg | DELAYED_RELEASE_TABLET | Freq: Every day | ORAL | Status: DC
  Administered 2018-07-04: 325 mg via ORAL
  Filled 2018-07-04: qty 1

## 2018-07-04 MED ORDER — ARIPIPRAZOLE 10 MG PO TABS
20.0000 mg | ORAL_TABLET | Freq: Every day | ORAL | Status: DC
  Administered 2018-07-04: 20 mg via ORAL
  Filled 2018-07-04: qty 2

## 2018-07-04 MED ORDER — SODIUM CHLORIDE 0.9 % IV SOLN
Freq: Once | INTRAVENOUS | Status: AC
  Administered 2018-07-04: 02:00:00 via INTRAVENOUS

## 2018-07-04 MED ORDER — MORPHINE SULFATE (PF) 2 MG/ML IV SOLN
2.0000 mg | Freq: Once | INTRAVENOUS | Status: AC
  Administered 2018-07-04: 2 mg via INTRAVENOUS

## 2018-07-04 MED ORDER — ALBUTEROL SULFATE (2.5 MG/3ML) 0.083% IN NEBU: 2.5000 mg | INHALATION_SOLUTION | RESPIRATORY_TRACT | Status: DC | PRN

## 2018-07-04 MED ORDER — MONTELUKAST SODIUM 10 MG PO TABS
10.0000 mg | ORAL_TABLET | Freq: Every day | ORAL | Status: DC
  Administered 2018-07-04: 10 mg via ORAL
  Filled 2018-07-04: qty 1

## 2018-07-04 MED ORDER — LEVOTHYROXINE SODIUM 25 MCG PO TABS
25.0000 ug | ORAL_TABLET | Freq: Every day | ORAL | Status: DC
  Administered 2018-07-04: 25 ug via ORAL
  Filled 2018-07-04: qty 1

## 2018-07-04 MED ORDER — FLUTICASONE FUROATE-VILANTEROL 200-25 MCG/INH IN AEPB
1.0000 | INHALATION_SPRAY | Freq: Every day | RESPIRATORY_TRACT | Status: DC
  Administered 2018-07-04: 1 via RESPIRATORY_TRACT
  Filled 2018-07-04: qty 28

## 2018-07-04 MED ORDER — PREDNISONE 10 MG PO TABS
10.0000 mg | ORAL_TABLET | Freq: Every day | ORAL | Status: DC
  Administered 2018-07-04: 10 mg via ORAL
  Filled 2018-07-04: qty 1

## 2018-07-04 MED ORDER — ACETAMINOPHEN 325 MG PO TABS: 650.0000 mg | ORAL_TABLET | Freq: Four times a day (QID) | ORAL | Status: DC | PRN

## 2018-07-04 MED ORDER — HEPARIN SODIUM (PORCINE) 5000 UNIT/ML IJ SOLN: 5000.0000 [IU] | Freq: Three times a day (TID) | INTRAMUSCULAR | Status: DC

## 2018-07-04 MED ORDER — DOCUSATE SODIUM 100 MG PO CAPS
100.0000 mg | ORAL_CAPSULE | Freq: Two times a day (BID) | ORAL | Status: DC
  Administered 2018-07-04 (×2): 100 mg via ORAL
  Filled 2018-07-04 (×2): qty 1

## 2018-07-04 MED ORDER — BISACODYL 5 MG PO TBEC: 5.0000 mg | DELAYED_RELEASE_TABLET | Freq: Every day | ORAL | Status: DC | PRN

## 2018-07-04 MED ORDER — ISOSORBIDE MONONITRATE ER 60 MG PO TB24
60.0000 mg | ORAL_TABLET | Freq: Every day | ORAL | Status: DC
  Administered 2018-07-04: 60 mg via ORAL
  Filled 2018-07-04: qty 1

## 2018-07-04 MED ORDER — POLYVINYL ALCOHOL 1.4 % OP SOLN
1.0000 [drp] | Freq: Two times a day (BID) | OPHTHALMIC | Status: DC
  Administered 2018-07-04: 1 [drp] via OPHTHALMIC
  Filled 2018-07-04: qty 15

## 2018-07-04 NOTE — ED Notes (Signed)
Pt's fam member to desk, reports pt with CP att, EDP notified

## 2018-07-04 NOTE — H&P (Addendum)
[facility] NAME: [name] [name]    MR#:  [id number]  DATE OF BIRTH:  [birth year]  DATE OF ADMISSION:  07/03/2018  PRIMARY CARE PHYSICIAN: McLean-Scocuzza, Nino Glow, MD   REQUESTING/REFERRING PHYSICIAN:   CHIEF COMPLAINT:   Chief Complaint  Patient presents with  . Chest Pain    HISTORY OF PRESENT ILLNESS: Taneshia Rodriguez-Ocasio  is a 52 y.o. female with extensive medical history, including lupus, ADHD, anemia, chronic pain, essential tremor and anxiety disorder. Patient presented to emergency room for acute onset of severe retrosternal chest pain, described as "something squeezing her chest".  The pain is radiating to the left arm and is associated with nausea.  The symptoms are worse with exertion and improve with nitroglycerin. She has been following with cardiology and had a recent stress test done, there was suspicious for some ischemic changes.  She was recommended to increase Imdur from 30 to 60 mg and the plan was to undergo cardiac cath in the near future. The first 2 troponin levels are negative.  CBC and CMP are grossly unremarkable. EKG, reviewed by myself, shows normal sinus rhythm with heart rate at 83 bpm.  Normal intervals, normal QRS, no ST elevation. No acute abnormalities per chest x-ray. Patient is admitted to rule out acute coronary syndrome.  PAST MEDICAL HISTORY:   Past Medical History:  Diagnosis Date  . ADHD   . Anemia   . ANXIETY 06/06/2007  . Arthritis    related to lupus  . Asthma   . BACK PAIN 02/22/2009  . Cervical cancer ([place])    LEEP 2005   . Chicken pox   . Chronic pain    goes to pain management provider  . Collagen vascular disease ([place])   . DEPRESSION 06/06/2007  . Essential tremor   . Fibromyalgia   . Fibromyalgia   . FREQUENCY, URINARY 07/07/2007  . Gastritis   . GERD 06/06/2007  . Glaucoma   . Headache    h/o migraines   . History of hiatal hernia   . History of kidney  stones   . HYPOTHYROIDISM 06/06/2007  . INTERSTITIAL CYSTITIS 09/11/2010  . Lupus ([place]) 2006  . Lupus ([place])   . Menopause   . Migraine   . Mitral valve regurgitation   . NEPHROLITHIASIS, HX OF 11/05/2010  . Neuropathy   . Neuropathy   . OCD (obsessive compulsive disorder)   . Optic neuritis    2005  . OSTEOPENIA 10/14/2009  . Osteoporosis   . OVERACTIVE BLADDER 10/14/2009  . PAIN, CHRONIC NEC 06/06/2007  . Panniculitis   . POLYARTHRITIS 08/19/2007  . Raynaud's disease   . Sicca syndrome ([place])   . 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 Vaginal prolapse    Dr. Marcelline Mates [facility]   . Vasculitis ([place])   . WEIGHT GAIN 02/22/2009    PAST SURGICAL HISTORY:  Past Surgical History:  Procedure Laterality Date  . APPENDECTOMY    . CERVICAL BIOPSY  W/ LOOP ELECTRODE EXCISION  2005  . CHOLECYSTECTOMY    . ESOPHAGOGASTRODUODENOSCOPY (EGD) WITH PROPOFOL N/A 08/27/2017   Procedure: ESOPHAGOGASTRODUODENOSCOPY (EGD) WITH PROPOFOL;  Surgeon: Manya Silvas, MD;  Location: [facility] ENDOSCOPY;  Service: Endoscopy;  Laterality: N/A;  . HAND SURGERY     repair of left metatarsal   . HARDWARE REMOVAL Right 02/16/2017   Procedure: HARDWARE REMOVAL FROM HIP;  Surgeon: Hessie Knows, MD;  Location: [facility] ORS;  Service: Orthopedics;  Laterality: Right;  .  JOINT REPLACEMENT    . OTHER SURGICAL HISTORY     left 3rd metatarsal fracture repair 2011   . REVISION TOTAL HIP ARTHROPLASTY  06/2009   replacement 2010 then screw and plate removal 0626; right hip  . TONSILLECTOMY    . TONSILLECTOMY    . wrist  ligament repair bilateral    . WRIST RECONSTRUCTION    . WRIST SURGERY     laceration of right wrist ligaments  . wrist surgery     laceration of left wrist surgery     SOCIAL HISTORY:  Social History   Tobacco Use  . Smoking status: Never Smoker  . Smokeless tobacco: Never Used  Substance Use Topics  . Alcohol use: No    FAMILY HISTORY:  Family History  Problem Relation Age of Onset  .  Hypertension Mother   . Arthritis Mother   . Heart disease Mother        afib  . Asthma Sister   . Asthma Daughter   . Arthritis Daughter   . Heart disease Daughter        ?heart condition on BB  . ADD / ADHD Daughter   . Pancreatic cancer Father   . Cancer Father        pancreatitic   . Diabetes Maternal Grandmother   . Heart disease Maternal Grandmother   . Arthritis Maternal Grandmother   . Hypertension Maternal Grandmother   . Colon cancer Maternal Uncle 45  . Crohn's disease Maternal Aunt   . Diabetes Maternal Aunt   . Breast cancer Cousin 43       maternal  . Cancer Cousin        m cousin breat cancer s/p removal both breasts     DRUG ALLERGIES:  Allergies  Allergen Reactions  . Latex Anaphylaxis and Rash  . Prasterone Other (See Comments) and Nausea And Vomiting    rash Other reaction(s): Headache Headaches.  . Shellfish-Derived Products Hives, Other (See Comments), Rash and Swelling    Facial swelling Uncoded Allergy. Allergen: seafood Uncoded Allergy. Allergen: CATS, Other Reaction: itch, wheezing Uncoded Allergy. Allergen: COMPAZINE, Other Reaction: tremors Facial swelling Facial swelling Uncoded Allergy. Allergen: seafood Uncoded Allergy. Allergen: CATS, Other Reaction: itch, wheezing Uncoded Allergy. Allergen: COMPAZINE, Other Reaction: tremors Facial swelling Uncoded Allergy. Allergen: seafood Uncoded Allergy. Allergen: CATS, Other Reaction: itch, wheezing Uncoded Allergy. Allergen: COMPAZINE, Other Reaction: tremors Facial swelling  . Bee Venom Itching  . Compazine [Prochlorperazine Edisylate] Other (See Comments)    tremors  . Dhea [Nutritional Supplements] Other (See Comments)    Headaches.  . Dilaudid [Hydromorphone Hcl]     ? reaction  . Lactose Intolerance (Gi)   . Other   . Prochlorperazine     Other reaction(s): Other (See Comments) ticks  . Promethazine     Other reaction(s): Other (See Comments) Ticks  . Promethazine Hcl Other  (See Comments)    CNS disorder  . Reclast [Zoledronic Acid]     Weakness could not move limbs, fatigue, increase sleep  . Clarithromycin Hives and Rash  . Fish Allergy Hives, Swelling and Rash    Facial swelling   . Hydromorphone Hcl Rash and Hives    "Rash all over"  . Penicillins Rash and Other (See Comments)    Has patient had a PCN reaction causing immediate rash, facial/tongue/throat swelling, SOB or lightheadedness with hypotension:No Has patient had a PCN reaction causing severe rash involving mucus membranes or skin necrosis:No Has patient had a PCN reaction  that required hospitalization:No Has patient had a PCN reaction occurring within the last 10 years:No If all of the above answers are "NO", then may proceed with Cephalosporin use.    REVIEW OF SYSTEMS:   CONSTITUTIONAL: No fever, fatigue or weakness.  EYES: No blurred or double vision.  EARS, NOSE, AND THROAT: No tinnitus or ear pain.  RESPIRATORY: No cough, shortness of breath, wheezing or hemoptysis.  CARDIOVASCULAR: Positive for chest pain, no orthopnea/edema.  GASTROINTESTINAL: Positive for nausea associated with chest pain.  No vomiting, diarrhea or abdominal pain.  GENITOURINARY: No dysuria, hematuria.  ENDOCRINE: No polyuria, nocturia,  HEMATOLOGY: No bleeding SKIN: No rash or lesion. MUSCULOSKELETAL: Positive history of lupus and chronic joints pain.   NEUROLOGIC: No focal weakness.  PSYCHIATRY: Positive history of ADHD and anxiety.  MEDICATIONS AT HOME:  Prior to Admission medications   Medication Sig Start Date End Date Taking? Authorizing Provider  albuterol (VENTOLIN HFA) 108 (90 Base) MCG/ACT inhaler Ventolin HFA 90 mcg/actuation aerosol inhaler  INHALE 2 PUFFS (180 MCG) BY INHALATION ROUTE EVERY 4 HOURS    [provider]  ARIPiprazole (ABILIFY) 20 MG tablet Take 20 mg by mouth daily.    [provider]  Ascorbic Acid (VITAMIN C) 500 MG CHEW Chew 2,500 mg by mouth daily.     [provider]  aspirin 325 MG tablet Take 1 tablet (325 mg total) by mouth daily. 02/18/17   Duanne Guess, PA-C  Biotin 10 MG CAPS Take by mouth.    [provider]  Biotin 5000 MCG SUBL Place 10,000 mcg under the tongue daily.    [provider]  budesonide-formoterol (SYMBICORT) 160-4.5 MCG/ACT inhaler Symbicort 160 mcg-4.5 mcg/actuation HFA aerosol inhaler    [provider]  butalbital-acetaminophen-caffeine (FIORICET, ESGIC) 50-325-40 MG tablet Take 1-2 tablets by mouth every 6 (six) hours as needed for headache. 02/25/18 02/25/19  Harvest Dark, MD  calcipotriene (DOVONOX) 0.005 % ointment calcipotriene 0.005 % topical ointment  APPLY A THIN LAYER TO THE AFFECTED AREA(S) BY TOPICAL ROUTE ONCE DAILY ; RUB IN GENTLY AND COMPLETELY    [provider]  Cholecalciferol (VITAMIN D3) 2000 units capsule Vitamin D3 2,000 unit capsule    [provider]  dapsone 25 MG tablet Take by mouth daily.     [provider]  dexmethylphenidate (FOCALIN XR) 20 MG 24 hr capsule dexmethylphenidate ER 20 mg capsule,extended release biphasic50-50    [provider]  diclofenac sodium (VOLTAREN) 1 % GEL Apply 2 g topically 4 (four) times daily as needed (for knee & finger pain.).     [provider]  dicyclomine (BENTYL) 10 MG capsule dicyclomine 10 mg capsule  every 8hours as needed    [provider]  DULoxetine (CYMBALTA) 60 MG capsule duloxetine 60 mg capsule,delayed release  1 twice daily    [provider]  DULoxetine (CYMBALTA) 60 MG capsule Take 60 mg by mouth 2 (two) times daily.    [provider]  EPINEPHrine (EPIPEN 2-PAK) 0.3 mg/0.3 mL IJ SOAJ injection EpiPen 2-Pak 0.3 mg/0.3 mL injection, auto-injector    [provider]  etodolac (LODINE) 500 MG tablet etodolac 500 mg tablet    [provider]  fentaNYL (DURAGESIC - DOSED MCG/HR) 75 MCG/HR Place 75 mcg onto the skin  every 3 (three) days.    [provider]  fluocinonide ointment (LIDEX) 0.05 % fluocinonide 0.05 % topical ointment  APPLY TO THE AFFECTED AREA(S) BY TOPICAL ROUTE 2 TIMES PER DAY  [provider]  folic acid (FOLVITE) 1 MG tablet Take 1 mg by mouth daily.    [provider]  hydrocortisone (PROCTOZONE-HC) 2.5 % rectal cream Proctozone-HC 2.5 % topical cream perineal applicator    [provider]  hydroxychloroquine (PLAQUENIL) 200 MG tablet Plaquenil 200 mg tablet  Take 1 tablet twice a day by oral route after meals for 90 days.    [provider]  ibuprofen (ADVIL,MOTRIN) 800 MG tablet Take 1 tablet (800 mg total) by mouth every 8 (eight) hours as needed. 08/04/17   Laban Emperor, PA-C  imiquimod (ALDARA) 5 % cream imiquimod 5 % topical cream packet  APPLY TO THE AFFECTED AREA(S) BY TOPICAL ROUTE 3 TIMES PER WEEK at bedtime    [provider]  isosorbide mononitrate (IMDUR) 60 MG 24 hr tablet Take 1 tablet (60 mg total) by mouth daily. 05/26/18   Carrie Mew, MD  ketorolac (TORADOL) 10 MG tablet Take 1 tablet (10 mg total) every 8 (eight) hours as needed by mouth for moderate pain (with food). 10/11/17   Eula Listen, MD  levothyroxine (SYNTHROID, LEVOTHROID) 25 MCG tablet Take 1 tablet (25 mcg total) by mouth daily before breakfast. 05/13/18   McLean-Scocuzza, Nino Glow, MD  lidocaine (LIDODERM) 5 % Place 1 patch onto the skin every 12 (twelve) hours. Remove & Discard patch within 12 hours or as directed by MD 08/04/17 08/04/18  Laban Emperor, PA-C  LORazepam (ATIVAN) 2 MG tablet Take 1-2 mg by mouth See admin instructions. 1 MG DAILY AS NEEDED FOR ANXIETY & SCHEDULED AT BEDTIME EACH NIGHT    [provider]  methylphenidate 18 MG PO CR tablet Take by mouth.     [provider]  montelukast (SINGULAIR) 10 MG tablet montelukast 10 mg tablet 1 pill qhs 12/13/17   McLean-Scocuzza, Nino Glow, MD  Multiple Vitamins-Minerals  (MULTI-VITAMIN GUMMIES PO) Multi Vitamin  GUMMIES    [provider]  ondansetron (ZOFRAN) 4 MG tablet Zofran 8 mg tablet  Take 1 tablet every 8 hours by oral route as needed.    [provider]  oxyCODONE-acetaminophen (PERCOCET/ROXICET) 5-325 MG tablet Take 1-2 tablets by mouth every 4 (four) hours as needed for moderate pain. Patient not taking: Reported on 07/01/2018 02/17/17   Duanne Guess, PA-C  pantoprazole (PROTONIX) 40 MG tablet Take one tablet by mouth twice daily Patient taking differently: TAKE 1 TABLET (40 MG) BY MOUTH ONCE DAILY IN THE MORNING. 12/18/13   Marletta Lor, MD  PARoxetine (PAXIL) 10 MG tablet Take 1 tablet (10 mg total) by mouth at bedtime. 07/01/18   Rubie Maid, MD  polyethylene glycol powder (GLYCOLAX/MIRALAX) powder Take 17 g by mouth daily. 03/29/18   Harvest Dark, MD  Polyvinyl Alcohol-Povidone (REFRESH OP) Place 1 drop into both eyes 2 (two) times daily.    [provider]  predniSONE (DELTASONE) 10 MG tablet prednisone 10 mg tablet  1 daily    [provider]  Probiotic Product (PROBIOTIC PO) Probiotic 10 billion cell capsule  daily    [provider]  silver sulfADIAZINE (SILVADENE) 1 % cream silver sulfadiazine 1 % topical cream    [provider]  Sodium Chloride 3 % AERS Saline Nasal Mist 3 %  spray nose deeply every hour or two and blow nose    [provider]  solifenacin (VESICARE) 5 MG tablet Take 1 tablet (5 mg total) by mouth daily. 12/13/17   McLean-Scocuzza, Nino Glow, MD  sucralfate (CARAFATE) 1 g  tablet sucralfate 1 gram tablet    [provider]  topiramate (TOPAMAX) 100 MG tablet topiramate 100 mg tablet  TAKE 1 TABLET BY MOUTH 2 TIMES DAILY.    [provider]  valACYclovir (VALTREX) 500 MG tablet valacyclovir 500 mg tablet  1 daily    [provider]      PHYSICAL EXAMINATION:   VITAL SIGNS: Blood pressure 111/62, pulse 81, temperature  97.6 F (36.4 C), temperature source Oral, resp. rate 17, height 5\' 10"  (1.778 m), weight 103 kg (227 lb), last menstrual period 01/02/2016, SpO2 99 %.  GENERAL:  52 y.o.-year-old patient lying in the bed with moderate distress, secondary to chest.  She seems very anxious. EYES: Pupils equal, round, reactive to light and accommodation. No scleral icterus. Extraocular muscles intact.  HEENT: Head atraumatic, normocephalic. Oropharynx and nasopharynx clear.  NECK:  Supple, no jugular venous distention. No thyroid enlargement, no tenderness.  LUNGS: Normal breath sounds bilaterally, no wheezing, rales,rhonchi or crepitation. No use of accessory muscles of respiration.  CARDIOVASCULAR: S1, S2 normal. No S3/S4.  ABDOMEN: Soft, nontender, nondistended. Bowel sounds present. No organomegaly or mass.  EXTREMITIES: No pedal edema, cyanosis, or clubbing.  NEUROLOGIC: Cranial nerves II through XII are intact. Muscle strength 5/5 in all extremities. Sensation intact. PSYCHIATRIC: The patient is alert and oriented x 3.  SKIN: No obvious rash, lesion, or ulcer.   LABORATORY PANEL:   CBC Recent Labs  Lab 07/03/18 2107  WBC 4.3  HGB 12.1  HCT 36.9  PLT 135*  MCV 89.3  MCH 29.4  MCHC 32.9  RDW 15.0*   ------------------------------------------------------------------------------------------------------------------  Chemistries  Recent Labs  Lab 07/03/18 2107  NA 146*  K 3.8  CL 115*  CO2 24  GLUCOSE 108*  BUN 13  CREATININE 0.86  CALCIUM 8.8*   ------------------------------------------------------------------------------------------------------------------ estimated creatinine clearance is 98.3 mL/min (by C-G formula based on SCr of 0.86 mg/dL). ------------------------------------------------------------------------------------------------------------------ No results for input(s): TSH, T4TOTAL, T3FREE, THYROIDAB in the last 72 hours.  Invalid input(s): FREET3   Coagulation  profile No results for input(s): INR, PROTIME in the last 168 hours. ------------------------------------------------------------------------------------------------------------------- No results for input(s): DDIMER in the last 72 hours. -------------------------------------------------------------------------------------------------------------------  Cardiac Enzymes Recent Labs  Lab 07/03/18 2107 07/03/18 2221  TROPONINI <0.03 <0.03   ------------------------------------------------------------------------------------------------------------------ Invalid input(s): POCBNP  ---------------------------------------------------------------------------------------------------------------  Urinalysis    Component Value Date/Time   COLORURINE YELLOW (A) 03/29/2018 1419   APPEARANCEUR CLEAR (A) 03/29/2018 1419   APPEARANCEUR Cloudy 09/25/2014 0016   LABSPEC 1.012 03/29/2018 1419   LABSPEC 1.023 09/25/2014 0016   PHURINE 7.0 03/29/2018 1419   GLUCOSEU NEGATIVE 03/29/2018 1419   GLUCOSEU NEGATIVE 11/05/2017 1058   HGBUR NEGATIVE 03/29/2018 1419   HGBUR negative 09/11/2010 1140   BILIRUBINUR NEGATIVE 03/29/2018 1419   BILIRUBINUR Negative 09/25/2014 0016   KETONESUR NEGATIVE 03/29/2018 1419   PROTEINUR NEGATIVE 03/29/2018 1419   UROBILINOGEN 0.2 11/05/2017 1058   NITRITE NEGATIVE 03/29/2018 1419   LEUKOCYTESUR NEGATIVE 03/29/2018 1419   LEUKOCYTESUR Negative 09/25/2014 0016     RADIOLOGY: Dg Chest 2 View  Result Date: 07/03/2018 CLINICAL DATA:  Central chest pain since 1800 hours, no relief with nitroglycerin, nausea EXAM: CHEST - 2 VIEW COMPARISON:  05/26/2018 FINDINGS: Normal heart size, mediastinal contours, and pulmonary vascularity. Pleuroparenchymal scarring at the lateral upper hemi thoraces bilaterally, stable No acute infiltrate, pleural effusion or pneumothorax. Bones demineralized. IMPRESSION: No acute abnormalities. Electronically Signed   By: Lavonia Dana M.D.   On:  07/03/2018 21:23  EKG: Orders placed or performed during the hospital encounter of 07/03/18  . EKG 12-Lead  . EKG 12-Lead  . ED EKG within 10 minutes  . ED EKG within 10 minutes    IMPRESSION AND PLAN:   1.  Chest pain, will rule out acute coronary syndrome.  Continue to monitor patient on telemetry and follow troponin levels.  Continue aspirin, Imdur and nitroglycerin as needed. Cardiology is consulted for further evaluation and treatment and possible cardiac catheterization. 2.  SLE, stable, continue maintenance therapy. 3.  Chronic pain, continue home pain regimen.  All the records are reviewed and case discussed with ED provider. Management plans discussed with the patient, family and they are in agreement.  CODE STATUS: Code Status History    Date Active Date Inactive Code Status Order ID Comments User Context   02/16/2017 1658 02/17/2017 1820 Full Code [id number]  Hessie Knows, MD Inpatient       TOTAL TIME TAKING CARE OF THIS PATIENT: 45 minutes.    Amelia Jo M.D on 07/04/2018 at 12:00 AM  Between 7am to 6pm - Pager - [phone]  After 6pm go to [email or web address] - password [email or web address] [facility]  [place]  Office  [phone]  CC: Primary care physician; McLean-Scocuzza, Nino Glow, MD

## 2018-07-04 NOTE — ED Notes (Signed)
Report fin att, securing transport

## 2018-07-04 NOTE — Discharge Summary (Signed)
[place] [place] [facility] [place] NAME: [name] [name]    MR#:  [id number]  DATE OF BIRTH:  01-04-1966  DATE OF ADMISSION:  07/03/2018 ADMITTING PHYSICIAN: Amelia Jo, MD  DATE OF DISCHARGE: 07/04/2018  PRIMARY CARE PHYSICIAN: McLean-Scocuzza, Nino Glow, MD    ADMISSION DIAGNOSIS:  Nonspecific chest pain [R07.9]  DISCHARGE DIAGNOSIS:  Active Problems:   Chest pain   SECONDARY DIAGNOSIS:   Past Medical History:  Diagnosis Date  . ADHD   . Anemia   . ANXIETY 06/06/2007  . Arthritis    related to lupus  . Asthma   . BACK PAIN 02/22/2009  . Cervical cancer ([place])    LEEP 2005   . Chicken pox   . Chronic pain    goes to pain management provider  . Collagen vascular disease ([place])   . DEPRESSION 06/06/2007  . Essential tremor   . Fibromyalgia   . Fibromyalgia   . FREQUENCY, URINARY 07/07/2007  . Gastritis   . GERD 06/06/2007  . Glaucoma   . Headache    h/o migraines   . History of hiatal hernia   . History of kidney stones   . HYPOTHYROIDISM 06/06/2007  . INTERSTITIAL CYSTITIS 09/11/2010  . Lupus ([place]) 2006  . Lupus ([place])   . Menopause   . Migraine   . Mitral valve regurgitation   . NEPHROLITHIASIS, HX OF 11/05/2010  . Neuropathy   . Neuropathy   . OCD (obsessive compulsive disorder)   . Optic neuritis    2005  . OSTEOPENIA 10/14/2009  . Osteoporosis   . OVERACTIVE BLADDER 10/14/2009  . PAIN, CHRONIC NEC 06/06/2007  . Panniculitis   . POLYARTHRITIS 08/19/2007  . Raynaud's disease   . Sicca syndrome ([place])   . 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 Vaginal prolapse    Dr. Marcelline Mates [facility]   . Vasculitis ([place])   . WEIGHT GAIN 02/22/2009    HOSPITAL COURSE:   52 year old female with a history of lupus, ADHD and chronic anemia who presents to the ER with chest pain.  1.  Chest pain: Patient was ruled out for ACS.  Her troponins are negative.  She was evaluated by cardiology.  Due to her underlying extensive history of lupus  cardiac catheterization is not recommended at this time.  Patient will follow-up with her primary cardiologist and she will have a referral to [facility].  2.  Lupus: Patient will continue outpatient medications  3.  Chronic anxiety and tremors: Patient will continue outpatient regimen  4.  Depression: Continue Cymbalta  5.  ADHD: Continue methylphenidate  DISCHARGE CONDITIONS AND DIET:   Stable for discharge on heart healthy diet  CONSULTS OBTAINED:  Treatment Team:  Teodoro Spray, MD  DRUG ALLERGIES:   Allergies  Allergen Reactions  . Latex Anaphylaxis and Rash  . Prasterone Other (See Comments) and Nausea And Vomiting    rash Other reaction(s): Headache Headaches.  . Shellfish-Derived Products Hives, Other (See Comments), Rash and Swelling    Facial swelling Uncoded Allergy. Allergen: seafood Uncoded Allergy. Allergen: CATS, Other Reaction: itch, wheezing Uncoded Allergy. Allergen: COMPAZINE, Other Reaction: tremors Facial swelling Facial swelling Uncoded Allergy. Allergen: seafood Uncoded Allergy. Allergen: CATS, Other Reaction: itch, wheezing Uncoded Allergy. Allergen: COMPAZINE, Other Reaction: tremors Facial swelling Uncoded Allergy. Allergen: seafood Uncoded Allergy. Allergen: CATS, Other Reaction: itch, wheezing Uncoded Allergy. Allergen: COMPAZINE, Other Reaction: tremors Facial swelling  . Bee Venom Itching  . Compazine [Prochlorperazine Edisylate] Other (See Comments)  tremors  . Dhea [Nutritional Supplements] Other (See Comments)    Headaches.  . Dilaudid [Hydromorphone Hcl]     ? reaction  . Lactose Intolerance (Gi)   . Other   . Prochlorperazine     Other reaction(s): Other (See Comments) ticks  . Promethazine     Other reaction(s): Other (See Comments) Ticks  . Promethazine Hcl Other (See Comments)    CNS disorder  . Reclast [Zoledronic Acid]     Weakness could not move limbs, fatigue, increase sleep  . Clarithromycin Hives and Rash  . Fish  Allergy Hives, Swelling and Rash    Facial swelling   . Hydromorphone Hcl Rash and Hives    "Rash all over"  . Penicillins Rash and Other (See Comments)    Has patient had a PCN reaction causing immediate rash, facial/tongue/throat swelling, SOB or lightheadedness with hypotension:No Has patient had a PCN reaction causing severe rash involving mucus membranes or skin necrosis:No Has patient had a PCN reaction that required hospitalization:No Has patient had a PCN reaction occurring within the last 10 years:No If all of the above answers are "NO", then may proceed with Cephalosporin use.    DISCHARGE MEDICATIONS:   Allergies as of 07/04/2018      Reactions   Latex Anaphylaxis, Rash   Prasterone Other (See Comments), Nausea And Vomiting   rash Other reaction(s): Headache Headaches.   Shellfish-derived Products Hives, Other (See Comments), Rash, Swelling   Facial swelling Uncoded Allergy. Allergen: seafood Uncoded Allergy. Allergen: CATS, Other Reaction: itch, wheezing Uncoded Allergy. Allergen: COMPAZINE, Other Reaction: tremors Facial swelling Facial swelling Uncoded Allergy. Allergen: seafood Uncoded Allergy. Allergen: CATS, Other Reaction: itch, wheezing Uncoded Allergy. Allergen: COMPAZINE, Other Reaction: tremors Facial swelling Uncoded Allergy. Allergen: seafood Uncoded Allergy. Allergen: CATS, Other Reaction: itch, wheezing Uncoded Allergy. Allergen: COMPAZINE, Other Reaction: tremors Facial swelling   Bee Venom Itching   Compazine [prochlorperazine Edisylate] Other (See Comments)   tremors   Dhea [nutritional Supplements] Other (See Comments)   Headaches.   Dilaudid [hydromorphone Hcl]    ? reaction   Lactose Intolerance (gi)    Other    Prochlorperazine    Other reaction(s): Other (See Comments) ticks   Promethazine    Other reaction(s): Other (See Comments) Ticks   Promethazine Hcl Other (See Comments)   CNS disorder   Reclast [zoledronic Acid]    Weakness  could not move limbs, fatigue, increase sleep   Clarithromycin Hives, Rash   Fish Allergy Hives, Swelling, Rash   Facial swelling   Hydromorphone Hcl Rash, Hives   "Rash all over"   Penicillins Rash, Other (See Comments)   Has patient had a PCN reaction causing immediate rash, facial/tongue/throat swelling, SOB or lightheadedness with hypotension:No Has patient had a PCN reaction causing severe rash involving mucus membranes or skin necrosis:No Has patient had a PCN reaction that required hospitalization:No Has patient had a PCN reaction occurring within the last 10 years:No If all of the above answers are "NO", then may proceed with Cephalosporin use.      Medication List    TAKE these medications   ARIPiprazole 20 MG tablet Commonly known as:  ABILIFY Take 20 mg by mouth daily.   aspirin 325 MG tablet Take 1 tablet (325 mg total) by mouth daily.   Biotin 5000 MCG Subl Place 10,000 mcg under the tongue daily.   Biotin 10 MG Caps Take by mouth.   butalbital-acetaminophen-caffeine 50-325-40 MG tablet Commonly known as:  FIORICET, ESGIC Take 1-2 tablets  by mouth every 6 (six) hours as needed for headache.   calcipotriene 0.005 % ointment Commonly known as:  DOVONOX calcipotriene 0.005 % topical ointment  APPLY A THIN LAYER TO THE AFFECTED AREA(S) BY TOPICAL ROUTE ONCE DAILY ; RUB IN GENTLY AND COMPLETELY   dapsone 25 MG tablet Take by mouth daily.   dexmethylphenidate 20 MG 24 hr capsule Commonly known as:  FOCALIN XR dexmethylphenidate ER 20 mg capsule,extended release biphasic50-50   diclofenac sodium 1 % Gel Commonly known as:  VOLTAREN Apply 2 g topically 4 (four) times daily as needed (for knee & finger pain.).   dicyclomine 10 MG capsule Commonly known as:  BENTYL dicyclomine 10 mg capsule  every 8hours as needed   DULoxetine 60 MG capsule Commonly known as:  CYMBALTA duloxetine 60 mg capsule,delayed release  1 twice daily   DULoxetine 60 MG  capsule Commonly known as:  CYMBALTA Take 60 mg by mouth 2 (two) times daily.   EPIPEN 2-PAK 0.3 mg/0.3 mL Soaj injection Generic drug:  EPINEPHrine EpiPen 2-Pak 0.3 mg/0.3 mL injection, auto-injector   etodolac 500 MG tablet Commonly known as:  LODINE etodolac 500 mg tablet   fentaNYL 75 MCG/HR Commonly known as:  DURAGESIC - dosed mcg/hr Place 75 mcg onto the skin every 3 (three) days.   fluocinonide ointment 0.05 % Commonly known as:  LIDEX fluocinonide 0.05 % topical ointment  APPLY TO THE AFFECTED AREA(S) BY TOPICAL ROUTE 2 TIMES PER DAY   folic acid 1 MG tablet Commonly known as:  FOLVITE Take 1 mg by mouth daily.   ibuprofen 800 MG tablet Commonly known as:  ADVIL,MOTRIN Take 1 tablet (800 mg total) by mouth every 8 (eight) hours as needed.   imiquimod 5 % cream Commonly known as:  ALDARA imiquimod 5 % topical cream packet  APPLY TO THE AFFECTED AREA(S) BY TOPICAL ROUTE 3 TIMES PER WEEK at bedtime   isosorbide mononitrate 60 MG 24 hr tablet Commonly known as:  IMDUR Take 1 tablet (60 mg total) by mouth daily.   ketorolac 10 MG tablet Commonly known as:  TORADOL Take 1 tablet (10 mg total) every 8 (eight) hours as needed by mouth for moderate pain (with food).   levothyroxine 25 MCG tablet Commonly known as:  SYNTHROID, LEVOTHROID Take 1 tablet (25 mcg total) by mouth daily before breakfast.   lidocaine 5 % Commonly known as:  LIDODERM Place 1 patch onto the skin every 12 (twelve) hours. Remove & Discard patch within 12 hours or as directed by MD   LORazepam 2 MG tablet Commonly known as:  ATIVAN Take 1-2 mg by mouth See admin instructions. 1 MG DAILY AS NEEDED FOR ANXIETY & SCHEDULED AT BEDTIME EACH NIGHT   methylphenidate 18 MG CR tablet Commonly known as:  CONCERTA Take by mouth.   montelukast 10 MG tablet Commonly known as:  SINGULAIR montelukast 10 mg tablet 1 pill qhs   MULTI-VITAMIN GUMMIES PO Multi Vitamin  GUMMIES    oxyCODONE-acetaminophen 5-325 MG tablet Commonly known as:  PERCOCET/ROXICET Take 1-2 tablets by mouth every 4 (four) hours as needed for moderate pain.   pantoprazole 40 MG tablet Commonly known as:  PROTONIX Take one tablet by mouth twice daily What changed:    how much to take  how to take this  when to take this   PARoxetine 10 MG tablet Commonly known as:  PAXIL Take 1 tablet (10 mg total) by mouth at bedtime.   PLAQUENIL 200 MG tablet Generic  drug:  hydroxychloroquine Plaquenil 200 mg tablet  Take 1 tablet twice a day by oral route after meals for 90 days.   polyethylene glycol powder powder Commonly known as:  GLYCOLAX/MIRALAX Take 17 g by mouth daily.   predniSONE 10 MG tablet Commonly known as:  DELTASONE prednisone 10 mg tablet  1 daily   PROBIOTIC PO Probiotic 10 billion cell capsule  daily   PROCTOZONE-HC 2.5 % rectal cream Generic drug:  hydrocortisone Proctozone-HC 2.5 % topical cream perineal applicator   REFRESH OP Place 1 drop into both eyes 2 (two) times daily.   silver sulfADIAZINE 1 % cream Commonly known as:  SILVADENE silver sulfadiazine 1 % topical cream   Sodium Chloride 3 % Aers Saline Nasal Mist 3 %  spray nose deeply every hour or two and blow nose   solifenacin 5 MG tablet Commonly known as:  VESICARE Take 1 tablet (5 mg total) by mouth daily.   sucralfate 1 g tablet Commonly known as:  CARAFATE sucralfate 1 gram tablet   SYMBICORT 160-4.5 MCG/ACT inhaler Generic drug:  budesonide-formoterol Symbicort 160 mcg-4.5 mcg/actuation HFA aerosol inhaler   topiramate 100 MG tablet Commonly known as:  TOPAMAX topiramate 100 mg tablet  TAKE 1 TABLET BY MOUTH 2 TIMES DAILY.   valACYclovir 500 MG tablet Commonly known as:  VALTREX valacyclovir 500 mg tablet  1 daily   VENTOLIN HFA 108 (90 Base) MCG/ACT inhaler Generic drug:  albuterol Ventolin HFA 90 mcg/actuation aerosol inhaler  INHALE 2 PUFFS (180 MCG) BY INHALATION  ROUTE EVERY 4 HOURS   Vitamin C 500 MG Chew Chew 2,500 mg by mouth daily.   Vitamin D3 2000 units capsule Vitamin D3 2,000 unit capsule   ZOFRAN 4 MG tablet Generic drug:  ondansetron Zofran 8 mg tablet  Take 1 tablet every 8 hours by oral route as needed.         Today   CHIEF COMPLAINT:   Patient without chest pain this morning   VITAL SIGNS:  Blood pressure 104/63, pulse 79, temperature 98.3 F (36.8 C), temperature source Oral, resp. rate 17, height 5\' 10"  (1.778 m), weight 228 lb 9.6 oz (103.7 kg), last menstrual period 01/02/2016, SpO2 95 %.   REVIEW OF SYSTEMS:  Review of Systems  Constitutional: Negative.  Negative for chills, fever and malaise/fatigue.  HENT: Negative.  Negative for ear discharge, ear pain, hearing loss, nosebleeds and sore throat.   Eyes: Negative.  Negative for blurred vision and pain.  Respiratory: Negative.  Negative for cough, hemoptysis, shortness of breath and wheezing.   Cardiovascular: Positive for chest pain. Negative for palpitations and leg swelling.  Gastrointestinal: Negative.  Negative for abdominal pain, blood in stool, diarrhea, nausea and vomiting.  Genitourinary: Negative.  Negative for dysuria.  Musculoskeletal: Negative.  Negative for back pain.  Skin: Negative.   Neurological: Positive for tremors. Negative for dizziness, speech change, focal weakness, seizures and headaches.  Endo/Heme/Allergies: Negative.  Does not bruise/bleed easily.  Psychiatric/Behavioral: Positive for depression. Negative for hallucinations and suicidal ideas. The patient is nervous/anxious.      PHYSICAL EXAMINATION:  GENERAL:  52 y.o.-year-old patient lying in the bed with no acute distress.  NECK:  Supple, no jugular venous distention. No thyroid enlargement, no tenderness.  LUNGS: Normal breath sounds bilaterally, no wheezing, rales,rhonchi  No use of accessory muscles of respiration.  CARDIOVASCULAR: S1, S2 normal. No murmurs, rubs, or  gallops.  ABDOMEN: Soft, non-tender, non-distended. Bowel sounds present. No organomegaly or mass.  EXTREMITIES: No  pedal edema, cyanosis, or clubbing.  PSYCHIATRIC: The patient is alert and oriented x 3.  SKIN: No obvious rash, lesion, or ulcer.   DATA REVIEW:   CBC Recent Labs  Lab 07/04/18 0507  WBC 4.2  HGB 11.4*  HCT 35.1  PLT 127*    Chemistries  Recent Labs  Lab 07/04/18 0507  NA 144  K 3.7  CL 113*  CO2 25  GLUCOSE 104*  BUN 13  CREATININE 0.75  CALCIUM 8.5*    Cardiac Enzymes Recent Labs  Lab 07/03/18 2107 07/03/18 2221  TROPONINI <0.03 <0.03    Microbiology Results  @[email or web address] @  RADIOLOGY:  Dg Chest 2 View  Result Date: 07/03/2018 CLINICAL DATA:  Central chest pain since 1800 hours, no relief with nitroglycerin, nausea EXAM: CHEST - 2 VIEW COMPARISON:  05/26/2018 FINDINGS: Normal heart size, mediastinal contours, and pulmonary vascularity. Pleuroparenchymal scarring at the lateral upper hemi thoraces bilaterally, stable No acute infiltrate, pleural effusion or pneumothorax. Bones demineralized. IMPRESSION: No acute abnormalities. Electronically Signed   By: Lavonia Dana M.D.   On: 07/03/2018 21:23      Allergies as of 07/04/2018      Reactions   Latex Anaphylaxis, Rash   Prasterone Other (See Comments), Nausea And Vomiting   rash Other reaction(s): Headache Headaches.   Shellfish-derived Products Hives, Other (See Comments), Rash, Swelling   Facial swelling Uncoded Allergy. Allergen: seafood Uncoded Allergy. Allergen: CATS, Other Reaction: itch, wheezing Uncoded Allergy. Allergen: COMPAZINE, Other Reaction: tremors Facial swelling Facial swelling Uncoded Allergy. Allergen: seafood Uncoded Allergy. Allergen: CATS, Other Reaction: itch, wheezing Uncoded Allergy. Allergen: COMPAZINE, Other Reaction: tremors Facial swelling Uncoded Allergy. Allergen: seafood Uncoded Allergy. Allergen: CATS, Other Reaction: itch, wheezing Uncoded Allergy.  Allergen: COMPAZINE, Other Reaction: tremors Facial swelling   Bee Venom Itching   Compazine [prochlorperazine Edisylate] Other (See Comments)   tremors   Dhea [nutritional Supplements] Other (See Comments)   Headaches.   Dilaudid [hydromorphone Hcl]    ? reaction   Lactose Intolerance (gi)    Other    Prochlorperazine    Other reaction(s): Other (See Comments) ticks   Promethazine    Other reaction(s): Other (See Comments) Ticks   Promethazine Hcl Other (See Comments)   CNS disorder   Reclast [zoledronic Acid]    Weakness could not move limbs, fatigue, increase sleep   Clarithromycin Hives, Rash   Fish Allergy Hives, Swelling, Rash   Facial swelling   Hydromorphone Hcl Rash, Hives   "Rash all over"   Penicillins Rash, Other (See Comments)   Has patient had a PCN reaction causing immediate rash, facial/tongue/throat swelling, SOB or lightheadedness with hypotension:No Has patient had a PCN reaction causing severe rash involving mucus membranes or skin necrosis:No Has patient had a PCN reaction that required hospitalization:No Has patient had a PCN reaction occurring within the last 10 years:No If all of the above answers are "NO", then may proceed with Cephalosporin use.      Medication List    TAKE these medications   ARIPiprazole 20 MG tablet Commonly known as:  ABILIFY Take 20 mg by mouth daily.   aspirin 325 MG tablet Take 1 tablet (325 mg total) by mouth daily.   Biotin 5000 MCG Subl Place 10,000 mcg under the tongue daily.   Biotin 10 MG Caps Take by mouth.   butalbital-acetaminophen-caffeine 50-325-40 MG tablet Commonly known as:  FIORICET, ESGIC Take 1-2 tablets by mouth every 6 (six) hours as needed for headache.   calcipotriene 0.005 % ointment  Commonly known as:  DOVONOX calcipotriene 0.005 % topical ointment  APPLY A THIN LAYER TO THE AFFECTED AREA(S) BY TOPICAL ROUTE ONCE DAILY ; RUB IN GENTLY AND COMPLETELY   dapsone 25 MG tablet Take by mouth  daily.   dexmethylphenidate 20 MG 24 hr capsule Commonly known as:  FOCALIN XR dexmethylphenidate ER 20 mg capsule,extended release biphasic50-50   diclofenac sodium 1 % Gel Commonly known as:  VOLTAREN Apply 2 g topically 4 (four) times daily as needed (for knee & finger pain.).   dicyclomine 10 MG capsule Commonly known as:  BENTYL dicyclomine 10 mg capsule  every 8hours as needed   DULoxetine 60 MG capsule Commonly known as:  CYMBALTA duloxetine 60 mg capsule,delayed release  1 twice daily   DULoxetine 60 MG capsule Commonly known as:  CYMBALTA Take 60 mg by mouth 2 (two) times daily.   EPIPEN 2-PAK 0.3 mg/0.3 mL Soaj injection Generic drug:  EPINEPHrine EpiPen 2-Pak 0.3 mg/0.3 mL injection, auto-injector   etodolac 500 MG tablet Commonly known as:  LODINE etodolac 500 mg tablet   fentaNYL 75 MCG/HR Commonly known as:  DURAGESIC - dosed mcg/hr Place 75 mcg onto the skin every 3 (three) days.   fluocinonide ointment 0.05 % Commonly known as:  LIDEX fluocinonide 0.05 % topical ointment  APPLY TO THE AFFECTED AREA(S) BY TOPICAL ROUTE 2 TIMES PER DAY   folic acid 1 MG tablet Commonly known as:  FOLVITE Take 1 mg by mouth daily.   ibuprofen 800 MG tablet Commonly known as:  ADVIL,MOTRIN Take 1 tablet (800 mg total) by mouth every 8 (eight) hours as needed.   imiquimod 5 % cream Commonly known as:  ALDARA imiquimod 5 % topical cream packet  APPLY TO THE AFFECTED AREA(S) BY TOPICAL ROUTE 3 TIMES PER WEEK at bedtime   isosorbide mononitrate 60 MG 24 hr tablet Commonly known as:  IMDUR Take 1 tablet (60 mg total) by mouth daily.   ketorolac 10 MG tablet Commonly known as:  TORADOL Take 1 tablet (10 mg total) every 8 (eight) hours as needed by mouth for moderate pain (with food).   levothyroxine 25 MCG tablet Commonly known as:  SYNTHROID, LEVOTHROID Take 1 tablet (25 mcg total) by mouth daily before breakfast.   lidocaine 5 % Commonly known as:   LIDODERM Place 1 patch onto the skin every 12 (twelve) hours. Remove & Discard patch within 12 hours or as directed by MD   LORazepam 2 MG tablet Commonly known as:  ATIVAN Take 1-2 mg by mouth See admin instructions. 1 MG DAILY AS NEEDED FOR ANXIETY & SCHEDULED AT BEDTIME EACH NIGHT   methylphenidate 18 MG CR tablet Commonly known as:  CONCERTA Take by mouth.   montelukast 10 MG tablet Commonly known as:  SINGULAIR montelukast 10 mg tablet 1 pill qhs   MULTI-VITAMIN GUMMIES PO Multi Vitamin  GUMMIES   oxyCODONE-acetaminophen 5-325 MG tablet Commonly known as:  PERCOCET/ROXICET Take 1-2 tablets by mouth every 4 (four) hours as needed for moderate pain.   pantoprazole 40 MG tablet Commonly known as:  PROTONIX Take one tablet by mouth twice daily What changed:    how much to take  how to take this  when to take this   PARoxetine 10 MG tablet Commonly known as:  PAXIL Take 1 tablet (10 mg total) by mouth at bedtime.   PLAQUENIL 200 MG tablet Generic drug:  hydroxychloroquine Plaquenil 200 mg tablet  Take 1 tablet twice a day by oral  route after meals for 90 days.   polyethylene glycol powder powder Commonly known as:  GLYCOLAX/MIRALAX Take 17 g by mouth daily.   predniSONE 10 MG tablet Commonly known as:  DELTASONE prednisone 10 mg tablet  1 daily   PROBIOTIC PO Probiotic 10 billion cell capsule  daily   PROCTOZONE-HC 2.5 % rectal cream Generic drug:  hydrocortisone Proctozone-HC 2.5 % topical cream perineal applicator   REFRESH OP Place 1 drop into both eyes 2 (two) times daily.   silver sulfADIAZINE 1 % cream Commonly known as:  SILVADENE silver sulfadiazine 1 % topical cream   Sodium Chloride 3 % Aers Saline Nasal Mist 3 %  spray nose deeply every hour or two and blow nose   solifenacin 5 MG tablet Commonly known as:  VESICARE Take 1 tablet (5 mg total) by mouth daily.   sucralfate 1 g tablet Commonly known as:  CARAFATE sucralfate 1 gram  tablet   SYMBICORT 160-4.5 MCG/ACT inhaler Generic drug:  budesonide-formoterol Symbicort 160 mcg-4.5 mcg/actuation HFA aerosol inhaler   topiramate 100 MG tablet Commonly known as:  TOPAMAX topiramate 100 mg tablet  TAKE 1 TABLET BY MOUTH 2 TIMES DAILY.   valACYclovir 500 MG tablet Commonly known as:  VALTREX valacyclovir 500 mg tablet  1 daily   VENTOLIN HFA 108 (90 Base) MCG/ACT inhaler Generic drug:  albuterol Ventolin HFA 90 mcg/actuation aerosol inhaler  INHALE 2 PUFFS (180 MCG) BY INHALATION ROUTE EVERY 4 HOURS   Vitamin C 500 MG Chew Chew 2,500 mg by mouth daily.   Vitamin D3 2000 units capsule Vitamin D3 2,000 unit capsule   ZOFRAN 4 MG tablet Generic drug:  ondansetron Zofran 8 mg tablet  Take 1 tablet every 8 hours by oral route as needed.        }   Management plans discussed with the patient and she is in agreement. Stable for discharge home  Patient should follow up with dr Lorinda Creed  CODE STATUS:     Code Status Orders  (From admission, onward)        Start     Ordered   07/04/18 0052  Full code  Continuous     07/04/18 0052    Code Status History    Date Active Date Inactive Code Status Order ID Comments User Context   02/16/2017 1658 02/17/2017 1820 Full Code [id number]  Hessie Knows, MD Inpatient      TOTAL TIME TAKING CARE OF THIS PATIENT: 38 minutes.    Note: This dictation was prepared with Dragon dictation along with smaller phrase technology. Any transcriptional errors that result from this process are unintentional.  Keion Neels M.D on 07/04/2018 at 11:57 AM  Between 7am to 6pm - Pager - [phone] After 6pm go to [email or web address] - password [email or web address] [facility]  Office  [phone]  CC: Primary care physician; McLean-Scocuzza, Nino Glow, MD

## 2018-07-05 LAB — HIV ANTIBODY (ROUTINE TESTING W REFLEX): HIV Screen 4th Generation wRfx: NONREACTIVE

## 2018-07-06 ENCOUNTER — Ambulatory Visit: Payer: Medicare Other | Admitting: Internal Medicine

## 2018-07-06 ENCOUNTER — Encounter: Payer: Self-pay | Admitting: Internal Medicine

## 2018-07-06 VITALS — BP 132/82 | HR 87 | Temp 97.8°F | Ht 68.5 in | Wt 229.0 lb

## 2018-07-06 DIAGNOSIS — D696 Thrombocytopenia, unspecified: Secondary | ICD-10-CM | POA: Diagnosis not present

## 2018-07-06 DIAGNOSIS — T782XXD Anaphylactic shock, unspecified, subsequent encounter: Secondary | ICD-10-CM

## 2018-07-06 DIAGNOSIS — Z1159 Encounter for screening for other viral diseases: Secondary | ICD-10-CM

## 2018-07-06 DIAGNOSIS — R079 Chest pain, unspecified: Secondary | ICD-10-CM | POA: Diagnosis not present

## 2018-07-06 DIAGNOSIS — E039 Hypothyroidism, unspecified: Secondary | ICD-10-CM | POA: Diagnosis not present

## 2018-07-06 DIAGNOSIS — Z0184 Encounter for antibody response examination: Secondary | ICD-10-CM

## 2018-07-06 DIAGNOSIS — Z13220 Encounter for screening for lipoid disorders: Secondary | ICD-10-CM | POA: Diagnosis not present

## 2018-07-06 DIAGNOSIS — R739 Hyperglycemia, unspecified: Secondary | ICD-10-CM

## 2018-07-06 DIAGNOSIS — M329 Systemic lupus erythematosus, unspecified: Secondary | ICD-10-CM

## 2018-07-06 LAB — LIPID PANEL
Cholesterol: 176 mg/dL (ref 0–200)
HDL: 74.8 mg/dL (ref >39.00)
LDL Cholesterol: 88 mg/dL (ref 0–99)
NONHDL: 101.1
Total CHOL/HDL Ratio: 2
Triglycerides: 68 mg/dL (ref 0.0–149.0)
VLDL: 13.6 mg/dL (ref 0.0–40.0)

## 2018-07-06 LAB — HEPATIC FUNCTION PANEL
ALK PHOS: 74 U/L (ref 39–117)
ALT: 17 U/L (ref 0–35)
AST: 18 U/L (ref 0–37)
Albumin: 4.5 g/dL (ref 3.5–5.2)
BILIRUBIN DIRECT: 0.1 mg/dL (ref 0.0–0.3)
TOTAL PROTEIN: 6.7 g/dL (ref 6.0–8.3)
Total Bilirubin: 0.3 mg/dL (ref 0.2–1.2)

## 2018-07-06 LAB — TSH: TSH: 3.58 u[IU]/mL (ref 0.35–4.50)

## 2018-07-06 LAB — HEMOGLOBIN A1C: Hgb A1c MFr Bld: 5.3 % (ref 4.6–6.5)

## 2018-07-06 MED ORDER — EPINEPHRINE 0.3 MG/0.3ML IJ SOAJ: 0.3000 mg | Freq: Once | INTRAMUSCULAR | 2 refills | Status: DC

## 2018-07-06 MED ORDER — FOLIC ACID 1 MG PO TABS: 1.0000 mg | ORAL_TABLET | Freq: Every day | ORAL | 3 refills | Status: DC

## 2018-07-06 MED ORDER — EPINEPHRINE 0.3 MG/0.3ML IJ SOAJ: 0.3000 mg | Freq: Once | INTRAMUSCULAR | 2 refills | Status: AC

## 2018-07-06 NOTE — Progress Notes (Signed)
Pre visit review using our clinic review tool, if applicable. No additional management support is needed unless otherwise documented below in the visit note. 

## 2018-07-06 NOTE — Patient Instructions (Addendum)
F/u in 3 months  Nonspecific Chest Pain Chest pain can be caused by many different conditions. There is always a chance that your pain could be related to something serious, such as a heart attack or a blood clot in your lungs. Chest pain can also be caused by conditions that are not life-threatening. If you have chest pain, it is very important to follow up with your health care provider. What are the causes? Causes of this condition include:  Heartburn.  Pneumonia or bronchitis.  Anxiety or stress.  Inflammation around your heart (pericarditis) or lung (pleuritis or pleurisy).  A blood clot in your lung.  A collapsed lung (pneumothorax). This can develop suddenly on its own (spontaneous pneumothorax) or from trauma to the chest.  Shingles infection (varicella-zoster virus).  Heart attack.  Damage to the bones, muscles, and cartilage that make up your chest wall. This can include: ? Bruised bones due to injury. ? Strained muscles or cartilage due to frequent or repeated coughing or overwork. ? Fracture to one or more ribs. ? Sore cartilage due to inflammation (costochondritis).  What increases the risk? Risk factors for this condition may include:  Activities that increase your risk for trauma or injury to your chest.  Respiratory infections or conditions that cause frequent coughing.  Medical conditions or overeating that can cause heartburn.  Heart disease or family history of heart disease.  Conditions or health behaviors that increase your risk of developing a blood clot.  Having had chicken pox (varicella zoster).  What are the signs or symptoms? Chest pain can feel like:  Burning or tingling on the surface of your chest or deep in your chest.  Crushing, pressure, aching, or squeezing pain.  Dull or sharp pain that is worse when you move, cough, or take a deep breath.  Pain that is also felt in your back, neck, shoulder, or arm, or pain that spreads to any  of these areas.  Your chest pain may come and go, or it may stay constant. How is this diagnosed? Lab tests or other studies may be needed to find the cause of your pain. Your health care provider may have you take a test called an ECG (electrocardiogram). An ECG records your heartbeat patterns at the time the test is performed. You may also have other tests, such as:  Transthoracic echocardiogram (TTE). In this test, sound waves are used to create a picture of the heart structures and to look at how blood flows through your heart.  Transesophageal echocardiogram (TEE).This is a more advanced imaging test that takes images from inside your body. It allows your health care provider to see your heart in finer detail.  Cardiac monitoring. This allows your health care provider to monitor your heart rate and rhythm in real time.  Holter monitor. This is a portable device that records your heartbeat and can help to diagnose abnormal heartbeats. It allows your health care provider to track your heart activity for several days, if needed.  Stress tests. These can be done through exercise or by taking medicine that makes your heart beat more quickly.  Blood tests.  Other imaging tests.  How is this treated? Treatment depends on what is causing your chest pain. Treatment may include:  Medicines. These may include: ? Acid blockers for heartburn. ? Anti-inflammatory medicine. ? Pain medicine for inflammatory conditions. ? Antibiotic medicine, if an infection is present. ? Medicines to dissolve blood clots. ? Medicines to treat coronary artery disease (CAD).  Supportive care for conditions that do not require medicines. This may include: ? Resting. ? Applying heat or cold packs to injured areas. ? Limiting activities until pain decreases.  Follow these instructions at home: Medicines  If you were prescribed an antibiotic, take it as told by your health care provider. Do not stop taking the  antibiotic even if you start to feel better.  Take over-the-counter and prescription medicines only as told by your health care provider. Lifestyle  Do not use any products that contain nicotine or tobacco, such as cigarettes and e-cigarettes. If you need help quitting, ask your health care provider.  Do not drink alcohol.  Make lifestyle changes as directed by your health care provider. These may include: ? Getting regular exercise. Ask your health care provider to suggest some activities that are safe for you. ? Eating a heart-healthy diet. A registered dietitian can help you to learn healthy eating options. ? Maintaining a healthy weight. ? Managing diabetes, if necessary. ? Reducing stress, such as with yoga or relaxation techniques. General instructions  Avoid any activities that bring on chest pain.  If heartburn is the cause for your chest pain, raise (elevate) the head of your bed about 6 inches (15 cm) by putting blocks under the legs. Sleeping with more pillows does not effectively relieve heartburn because it only changes the position of your head.  Keep all follow-up visits as told by your health care provider. This is important. This includes any further testing if your chest pain does not go away. Contact a health care provider if:  Your chest pain does not go away.  You have a rash with blisters on your chest.  You have a fever.  You have chills. Get help right away if:  Your chest pain is worse.  You have a cough that gets worse, or you cough up blood.  You have severe pain in your abdomen.  You have severe weakness.  You faint.  You have sudden, unexplained chest discomfort.  You have sudden, unexplained discomfort in your arms, back, neck, or jaw.  You have shortness of breath at any time.  You suddenly start to sweat, or your skin gets clammy.  You feel nauseous or you vomit.  You suddenly feel light-headed or dizzy.  Your heart begins to beat  quickly, or it feels like it is skipping beats. These symptoms may represent a serious problem that is an emergency. Do not wait to see if the symptoms will go away. Get medical help right away. Call your local emergency services (911 in the [place] Do not drive yourself to the hospital. This information is not intended to replace advice given to you by your health care provider. Make sure you discuss any questions you have with your health care provider. Document Released: 08/26/2005 Document Revised: 08/10/2016 Document Reviewed: 08/10/2016 [employer] Interactive Patient Education  2017 [employer].

## 2018-07-06 NOTE — Progress Notes (Addendum)
Chief Complaint  Patient presents with  . Follow-up   HFU  1. CP and sob and nausea with dizziness CXR neg and trop neg x 2 Dr. Josefa Half appt 07/13/18 and rec referral [facility] for angiogram per pt due to abnormal stress test and still having CP despite being on Imdur 60 mg  2. Needs refills folic acid and epipen had to use epipen on daughter with anaphalaxis sx recenlty  3. Lupus/autoimmune d/o needs referral to Dr. Trula Slade in [facility] her rheumatologist Dr. Sarina Ill is no longer working    Review of Systems  Constitutional: Negative for weight loss.  HENT: Negative for hearing loss.   Respiratory: Negative for shortness of breath.   Cardiovascular: Positive for chest pain.  Gastrointestinal: Negative for nausea.  Musculoskeletal: Negative for joint pain.  Neurological: Positive for tremors. Negative for dizziness.  Psychiatric/Behavioral: Negative for depression.   Past Medical History:  Diagnosis Date  . ADHD   . Anemia   . ANXIETY 06/06/2007  . Arthritis    related to lupus  . Asthma   . BACK PAIN 02/22/2009  . Cervical cancer ([place])    LEEP 2005   . Chicken pox   . Chronic pain    goes to pain management provider  . Collagen vascular disease ([place])   . DEPRESSION 06/06/2007  . Essential tremor   . Fibromyalgia   . Fibromyalgia   . FREQUENCY, URINARY 07/07/2007  . Gastritis   . GERD 06/06/2007  . Glaucoma   . Headache    h/o migraines   . History of hiatal hernia   . History of kidney stones   . HYPOTHYROIDISM 06/06/2007  . INTERSTITIAL CYSTITIS 09/11/2010  . Lupus ([place]) 2006  . Lupus ([place])   . Menopause   . Migraine   . Mitral valve regurgitation   . NEPHROLITHIASIS, HX OF 11/05/2010  . Neuropathy   . Neuropathy   . OCD (obsessive compulsive disorder)   . Optic neuritis    2005  . OSTEOPENIA 10/14/2009  . Osteoporosis   . OVERACTIVE BLADDER 10/14/2009  . PAIN, CHRONIC NEC 06/06/2007  . Panniculitis   . POLYARTHRITIS 08/19/2007  . Raynaud's disease   . Sicca  syndrome ([place])   . 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 Vaginal prolapse    Dr. Marcelline Mates [facility]   . Vasculitis ([place])   . WEIGHT GAIN 02/22/2009   Past Surgical History:  Procedure Laterality Date  . APPENDECTOMY    . CERVICAL BIOPSY  W/ LOOP ELECTRODE EXCISION  2005  . CHOLECYSTECTOMY    . ESOPHAGOGASTRODUODENOSCOPY (EGD) WITH PROPOFOL N/A 08/27/2017   Procedure: ESOPHAGOGASTRODUODENOSCOPY (EGD) WITH PROPOFOL;  Surgeon: Manya Silvas, MD;  Location: [facility] ENDOSCOPY;  Service: Endoscopy;  Laterality: N/A;  . HAND SURGERY     repair of left metatarsal   . HARDWARE REMOVAL Right 02/16/2017   Procedure: HARDWARE REMOVAL FROM HIP;  Surgeon: Hessie Knows, MD;  Location: [facility] ORS;  Service: Orthopedics;  Laterality: Right;  . JOINT REPLACEMENT    . OTHER SURGICAL HISTORY     left 3rd metatarsal fracture repair 2011   . REVISION TOTAL HIP ARTHROPLASTY  06/2009   replacement 2010 then screw and plate removal 4034; right hip  . TONSILLECTOMY    . TONSILLECTOMY    . wrist  ligament repair bilateral    . WRIST RECONSTRUCTION    . WRIST SURGERY     laceration of right wrist ligaments  . wrist surgery     laceration of  left wrist surgery    Family History  Problem Relation Age of Onset  . Hypertension Mother   . Arthritis Mother   . Heart disease Mother        afib  . Asthma Sister   . Asthma Daughter   . Arthritis Daughter   . Heart disease Daughter        ?heart condition on BB  . ADD / ADHD Daughter   . Pancreatic cancer Father   . Cancer Father        pancreatitic   . Diabetes Maternal Grandmother   . Heart disease Maternal Grandmother   . Arthritis Maternal Grandmother   . Hypertension Maternal Grandmother   . Colon cancer Maternal Uncle 13  . Crohn's disease Maternal Aunt   . Diabetes Maternal Aunt   . Breast cancer Cousin 80       maternal  . Cancer Cousin        m cousin breat cancer s/p removal both breasts    Social History   Socioeconomic History  .  Marital status: Divorced    Spouse name: Not on file  . Number of children: 1  . Years of education: Not on file  . Highest education level: Not on file  Occupational History  . Occupation: DISABLED    Employer: UNEMPLOYED  Social Needs  . Financial resource strain: Not on file  . Food insecurity:    Worry: Not on file    Inability: Not on file  . Transportation needs:    Medical: Not on file    Non-medical: Not on file  Tobacco Use  . Smoking status: Never Smoker  . Smokeless tobacco: Never Used  Substance and Sexual Activity  . Alcohol use: No  . Drug use: No  . Sexual activity: Not Currently    Birth control/protection: None, Post-menopausal  Lifestyle  . Physical activity:    Days per week: Not on file    Minutes per session: Not on file  . Stress: Not on file  Relationships  . Social connections:    Talks on phone: Not on file    Gets together: Not on file    Attends religious service: Not on file    Active member of club or organization: Not on file    Attends meetings of clubs or organizations: Not on file    Relationship status: Not on file  . Intimate partner violence:    Fear of current or ex partner: Not on file    Emotionally abused: Not on file    Physically abused: Not on file    Forced sexual activity: Not on file  Other Topics Concern  . 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      Current Meds  Medication Sig  . albuterol (VENTOLIN HFA) 108 (90 Base) MCG/ACT inhaler Ventolin HFA 90 mcg/actuation aerosol inhaler  INHALE 2 PUFFS (180 MCG) BY INHALATION ROUTE EVERY 4 HOURS  . ARIPiprazole (ABILIFY) 20 MG tablet Take 20 mg by mouth daily.  . Ascorbic Acid (VITAMIN C) 500 MG CHEW Chew 2,500 mg by mouth daily.  Marland Kitchen aspirin 325 MG tablet Take 1 tablet (325 mg total) by mouth daily.  . Biotin 10 MG CAPS Take by mouth.  .  Biotin 5000 MCG SUBL Place 10,000 mcg under the tongue daily.  Marland Kitchen  budesonide-formoterol (SYMBICORT) 160-4.5 MCG/ACT inhaler Symbicort 160 mcg-4.5 mcg/actuation HFA aerosol inhaler  . butalbital-acetaminophen-caffeine (FIORICET, ESGIC) 50-325-40 MG tablet Take 1-2 tablets by mouth every 6 (six) hours as needed for headache.  . calcipotriene (DOVONOX) 0.005 % ointment calcipotriene 0.005 % topical ointment  APPLY A THIN LAYER TO THE AFFECTED AREA(S) BY TOPICAL ROUTE ONCE DAILY ; RUB IN GENTLY AND COMPLETELY  . Cholecalciferol (VITAMIN D3) 2000 units capsule Vitamin D3 2,000 unit capsule  . dapsone 25 MG tablet Take by mouth daily.   Marland Kitchen dexmethylphenidate (FOCALIN XR) 20 MG 24 hr capsule dexmethylphenidate ER 20 mg capsule,extended release biphasic50-50  . diclofenac sodium (VOLTAREN) 1 % GEL Apply 2 g topically 4 (four) times daily as needed (for knee & finger pain.).   Marland Kitchen dicyclomine (BENTYL) 10 MG capsule dicyclomine 10 mg capsule  every 8hours as needed  . DULoxetine (CYMBALTA) 60 MG capsule Take 60 mg by mouth 2 (two) times daily.  Marland Kitchen EPINEPHrine (EPIPEN 2-PAK) 0.3 mg/0.3 mL IJ SOAJ injection Inject 0.3 mLs (0.3 mg total) into the skin once for 1 dose.  . etodolac (LODINE) 500 MG tablet etodolac 500 mg tablet  . fentaNYL (DURAGESIC - DOSED MCG/HR) 75 MCG/HR Place 75 mcg onto the skin every 3 (three) days.  . fluocinonide ointment (LIDEX) 0.05 % fluocinonide 0.05 % topical ointment  APPLY TO THE AFFECTED AREA(S) BY TOPICAL ROUTE 2 TIMES PER DAY  . folic acid (FOLVITE) 1 MG tablet Take 1 mg by mouth daily.  . hydrocortisone (PROCTOZONE-HC) 2.5 % rectal cream Proctozone-HC 2.5 % topical cream perineal applicator  . hydroxychloroquine (PLAQUENIL) 200 MG tablet Plaquenil 200 mg tablet  Take 1 tablet twice a day by oral route after meals for 90 days.  Marland Kitchen ibuprofen (ADVIL,MOTRIN) 800 MG tablet Take 1 tablet (800 mg total) by mouth every 8 (eight) hours as needed.  . imiquimod (ALDARA) 5 % cream imiquimod  5 % topical cream packet  APPLY TO THE AFFECTED AREA(S) BY TOPICAL ROUTE 3 TIMES PER WEEK at bedtime  . isosorbide mononitrate (IMDUR) 60 MG 24 hr tablet Take 1 tablet (60 mg total) by mouth daily.  Marland Kitchen ketorolac (TORADOL) 10 MG tablet Take 1 tablet (10 mg total) every 8 (eight) hours as needed by mouth for moderate pain (with food).  Marland Kitchen levothyroxine (SYNTHROID, LEVOTHROID) 25 MCG tablet Take 1 tablet (25 mcg total) by mouth daily before breakfast.  . lidocaine (LIDODERM) 5 % Place 1 patch onto the skin every 12 (twelve) hours. Remove & Discard patch within 12 hours or as directed by MD  . LORazepam (ATIVAN) 2 MG tablet Take 1-2 mg by mouth See admin instructions. 1 MG DAILY AS NEEDED FOR ANXIETY & SCHEDULED AT BEDTIME EACH NIGHT  . methylphenidate 18 MG PO CR tablet Take by mouth.   . montelukast (SINGULAIR) 10 MG tablet montelukast 10 mg tablet 1 pill qhs  . Multiple Vitamins-Minerals (MULTI-VITAMIN GUMMIES PO) Multi Vitamin  GUMMIES  . ondansetron (ZOFRAN) 4 MG tablet Zofran 8 mg tablet  Take 1 tablet every 8 hours by oral route as needed.  . pantoprazole (PROTONIX) 40 MG tablet Take one tablet by mouth twice daily (Patient taking differently: TAKE 1 TABLET (40 MG) BY MOUTH ONCE DAILY IN THE MORNING.)  . PARoxetine (PAXIL) 10 MG tablet Take 1 tablet (10 mg total) by mouth at bedtime.  . polyethylene glycol powder (GLYCOLAX/MIRALAX) powder Take 17 g by mouth daily.  . Polyvinyl Alcohol-Povidone (REFRESH OP) Place 1 drop into both eyes 2 (two) times  daily.  . predniSONE (DELTASONE) 10 MG tablet prednisone 10 mg tablet  1 daily  . Probiotic Product (PROBIOTIC PO) Probiotic 10 billion cell capsule  daily  . silver sulfADIAZINE (SILVADENE) 1 % cream silver sulfadiazine 1 % topical cream  . Sodium Chloride 3 % AERS Saline Nasal Mist 3 %  spray nose deeply every hour or two and blow nose  . solifenacin (VESICARE) 5 MG tablet Take 1 tablet (5 mg total) by mouth daily.  . sucralfate (CARAFATE) 1 g  tablet sucralfate 1 gram tablet  . topiramate (TOPAMAX) 100 MG tablet topiramate 100 mg tablet  TAKE 1 TABLET BY MOUTH 2 TIMES DAILY.  . valACYclovir (VALTREX) 500 MG tablet valacyclovir 500 mg tablet  1 daily  . [DISCONTINUED] EPINEPHrine (EPIPEN 2-PAK) 0.3 mg/0.3 mL IJ SOAJ injection EpiPen 2-Pak 0.3 mg/0.3 mL injection, auto-injector   Allergies  Allergen Reactions  . Bee Venom Itching and Swelling    Affected area Affected area Affected area Affected area   . Fish Allergy Hives, Swelling and Rash    Facial swelling  Facial swelling  Facial swelling Facial swelling  Facial swelling   . Latex Anaphylaxis, Rash and Shortness Of Breath    Rash  Rash    . Prasterone Other (See Comments) and Nausea And Vomiting    rash Other reaction(s): Headache Headaches.  . Shellfish Allergy Hives, Other (See Comments), Rash and Swelling    Facial swelling Uncoded Allergy. Allergen: seafood Uncoded Allergy. Allergen: CATS, Other Reaction: itch, wheezing Uncoded Allergy. Allergen: COMPAZINE, Other Reaction: tremors Facial swelling Facial swelling Uncoded Allergy. Allergen: seafood Uncoded Allergy. Allergen: CATS, Other Reaction: itch, wheezing Uncoded Allergy. Allergen: COMPAZINE, Other Reaction: tremors Facial swelling Uncoded Allergy. Allergen: seafood Uncoded Allergy. Allergen: CATS, Other Reaction: itch, wheezing Uncoded Allergy. Allergen: COMPAZINE, Other Reaction: tremors Facial swelling Facial swelling Uncoded Allergy. Allergen: seafood Uncoded Allergy. Allergen: CATS, Other Reaction: itch, wheezing Uncoded Allergy. Allergen: COMPAZINE, Other Reaction: tremors Facial swelling Facial swelling Uncoded Allergy. Allergen: seafood Uncoded Allergy. Allergen: CATS, Other Reaction: itch, wheezing Uncoded Allergy. Allergen: COMPAZINE, Other Reaction: tremors   . Shellfish-Derived Products Hives, Other (See Comments), Rash and Swelling    Facial swelling Uncoded Allergy.  Allergen: seafood Uncoded Allergy. Allergen: CATS, Other Reaction: itch, wheezing Uncoded Allergy. Allergen: COMPAZINE, Other Reaction: tremors Facial swelling Facial swelling Uncoded Allergy. Allergen: seafood Uncoded Allergy. Allergen: CATS, Other Reaction: itch, wheezing Uncoded Allergy. Allergen: COMPAZINE, Other Reaction: tremors Facial swelling Uncoded Allergy. Allergen: seafood Uncoded Allergy. Allergen: CATS, Other Reaction: itch, wheezing Uncoded Allergy. Allergen: COMPAZINE, Other Reaction: tremors Facial swelling  . Compazine [Prochlorperazine Edisylate] Other (See Comments)    tremors  . Dhea [Nutritional Supplements] Other (See Comments)    Headaches.  . Dilaudid [Hydromorphone Hcl]     ? reaction  . Lactose   . Lactose Intolerance (Gi)   . Other Other (See Comments)    Other reaction(s): Unknown   . Prochlorperazine     Other reaction(s): Other (See Comments) ticks  . Promethazine     Other reaction(s): Other (See Comments) Ticks  . Promethazine Hcl Other (See Comments)    CNS disorder  . Reclast [Zoledronic Acid]     Weakness could not move limbs, fatigue, increase sleep  . Clarithromycin Hives and Rash  . Hydromorphone Hcl Rash and Hives    "Rash all over"  . Penicillins Rash and Other (See Comments)    Has patient had a PCN reaction causing immediate rash, facial/tongue/throat swelling, SOB or lightheadedness with hypotension:No Has patient  had a PCN reaction causing severe rash involving mucus membranes or skin necrosis:No Has patient had a PCN reaction that required hospitalization:No Has patient had a PCN reaction occurring within the last 10 years:No If all of the above answers are "NO", then may proceed with Cephalosporin use.   Recent Results (from the past 2160 hour(s))  Basic metabolic panel     Status: None   Collection Time: 05/26/18  2:12 AM  Result Value Ref Range   Sodium 141 135 - 145 mmol/L   Potassium 3.5 3.5 - 5.1 mmol/L   Chloride  109 98 - 111 mmol/L    Comment: Please note change in reference range.   CO2 25 22 - 32 mmol/L   Glucose, Bld 94 70 - 99 mg/dL    Comment: Please note change in reference range.   BUN 13 6 - 20 mg/dL    Comment: Please note change in reference range.   Creatinine, Ser 0.86 0.44 - 1.00 mg/dL   Calcium 8.9 8.9 - 10.3 mg/dL   GFR calc non Af Amer >60 >60 mL/min   GFR calc Af Amer >60 >60 mL/min    Comment: (NOTE) The eGFR has been calculated using the CKD EPI equation. This calculation has not been validated in all clinical situations. eGFR's persistently <60 mL/min signify possible Chronic Kidney Disease.    Anion gap 7 5 - 15    Comment: Performed at [facility], [address] [place], [place] [address]  CBC     Status: Abnormal   Collection Time: 05/26/18  2:12 AM  Result Value Ref Range   WBC 4.9 3.6 - 11.0 K/uL   RBC 4.25 3.80 - 5.20 MIL/uL   Hemoglobin 12.4 12.0 - 16.0 g/dL   HCT 38.2 35.0 - 47.0 %   MCV 89.9 80.0 - 100.0 fL   MCH 29.2 26.0 - 34.0 pg   MCHC 32.4 32.0 - 36.0 g/dL   RDW 14.5 11.5 - 14.5 %   Platelets 140 (L) 150 - 440 K/uL    Comment: Performed at [facility], [address] [place], [place] [address]  Troponin I     Status: None   Collection Time: 05/26/18  2:12 AM  Result Value Ref Range   Troponin I <0.03 <0.03 ng/mL    Comment: Performed at [facility], [address] [place], [place] [address]  Troponin I     Status: None   Collection Time: 05/26/18  5:35 AM  Result Value Ref Range   Troponin I <0.03 <0.03 ng/mL    Comment: Performed at [facility], [address] [place], [place] [address]  Cytology - PAP     Status: None   Collection Time: 07/01/18 12:00 AM  Result Value Ref Range   Adequacy      Satisfactory for evaluation. The presence or absence of an endocervical / transformation zone component cannot be determined because of atrophy.   Diagnosis      NEGATIVE FOR INTRAEPITHELIAL LESIONS OR  MALIGNANCY.   HPV NOT DETECTED     Comment: Normal Reference Range - NOT Detected   Material Submitted CervicoVaginal Pap [ThinPrep Imaged]   Basic metabolic panel     Status: Abnormal   Collection Time: 07/03/18  9:07 PM  Result Value Ref Range   Sodium 146 (H) 135 - 145 mmol/L   Potassium 3.8 3.5 - 5.1 mmol/L   Chloride 115 (H) 98 - 111 mmol/L   CO2 24 22 -  32 mmol/L   Glucose, Bld 108 (H) 70 - 99 mg/dL   BUN 13 6 - 20 mg/dL   Creatinine, Ser 0.86 0.44 - 1.00 mg/dL   Calcium 8.8 (L) 8.9 - 10.3 mg/dL   GFR calc non Af Amer >60 >60 mL/min   GFR calc Af Amer >60 >60 mL/min    Comment: (NOTE) The eGFR has been calculated using the CKD EPI equation. This calculation has not been validated in all clinical situations. eGFR's persistently <60 mL/min signify possible Chronic Kidney Disease.    Anion gap 7 5 - 15    Comment: Performed at [facility], [address] [place], [place] [address]  CBC     Status: Abnormal   Collection Time: 07/03/18  9:07 PM  Result Value Ref Range   WBC 4.3 3.6 - 11.0 K/uL   RBC 4.13 3.80 - 5.20 MIL/uL   Hemoglobin 12.1 12.0 - 16.0 g/dL   HCT 36.9 35.0 - 47.0 %   MCV 89.3 80.0 - 100.0 fL   MCH 29.4 26.0 - 34.0 pg   MCHC 32.9 32.0 - 36.0 g/dL   RDW 15.0 (H) 11.5 - 14.5 %   Platelets 135 (L) 150 - 440 K/uL    Comment: Performed at [facility], [address] [place], [place] [address]  Troponin I     Status: None   Collection Time: 07/03/18  9:07 PM  Result Value Ref Range   Troponin I <0.03 <0.03 ng/mL    Comment: Performed at [facility], [address] [place], [place] [address]  Troponin I     Status: None   Collection Time: 07/03/18 10:21 PM  Result Value Ref Range   Troponin I <0.03 <0.03 ng/mL    Comment: Performed at [facility], [address] [place], [place] [address]  HIV antibody (Routine Testing)     Status: None   Collection Time: 07/04/18  5:07 AM  Result Value Ref Range   HIV Screen  4th Generation wRfx Non Reactive Non Reactive    Comment: (NOTE) Performed At: [facility] [place], [place] [id number] Rush Farmer MD ZH:[id number]   Basic metabolic panel     Status: Abnormal   Collection Time: 07/04/18  5:07 AM  Result Value Ref Range   Sodium 144 135 - 145 mmol/L   Potassium 3.7 3.5 - 5.1 mmol/L   Chloride 113 (H) 98 - 111 mmol/L   CO2 25 22 - 32 mmol/L   Glucose, Bld 104 (H) 70 - 99 mg/dL   BUN 13 6 - 20 mg/dL   Creatinine, Ser 0.75 0.44 - 1.00 mg/dL   Calcium 8.5 (L) 8.9 - 10.3 mg/dL   GFR calc non Af Amer >60 >60 mL/min   GFR calc Af Amer >60 >60 mL/min    Comment: (NOTE) The eGFR has been calculated using the CKD EPI equation. This calculation has not been validated in all clinical situations. eGFR's persistently <60 mL/min signify possible Chronic Kidney Disease.    Anion gap 6 5 - 15    Comment: Performed at [facility], [address] [place], [place] [address]  CBC     Status: Abnormal   Collection Time: 07/04/18  5:07 AM  Result Value Ref Range   WBC 4.2 3.6 - 11.0 K/uL   RBC 3.91 3.80 - 5.20 MIL/uL   Hemoglobin 11.4 (L) 12.0 - 16.0 g/dL   HCT 35.1 35.0 - 47.0 %   MCV  89.7 80.0 - 100.0 fL   MCH 29.2 26.0 - 34.0 pg   MCHC 32.5 32.0 - 36.0 g/dL   RDW 14.8 (H) 11.5 - 14.5 %   Platelets 127 (L) 150 - 440 K/uL    Comment: Performed at [facility], [address] [place], [place] [address]  Glucose, capillary     Status: None   Collection Time: 07/04/18  8:24 AM  Result Value Ref Range   Glucose-Capillary 85 70 - 99 mg/dL   Comment 1 Notify RN    Comment 2 Document in Chart    Objective  There is no height or weight on file to calculate BMI. Wt Readings from Last 3 Encounters:  07/04/18 228 lb 9.6 oz (103.7 kg)  07/01/18 227 lb (103 kg)  05/26/18 230 lb (104.3 kg)   Temp Readings from Last 3 Encounters:  07/04/18 98.3 F (36.8 C) (Oral)  05/26/18 98.1 F (36.7 C) (Oral)  03/29/18 99 F  (37.2 C) (Oral)   BP Readings from Last 3 Encounters:  07/04/18 104/63  07/01/18 112/63  05/26/18 123/67   Pulse Readings from Last 3 Encounters:  07/04/18 79  07/01/18 88  05/26/18 77    Physical Exam  Constitutional: She is oriented to person, place, and time. Vital signs are normal. She appears well-developed and well-nourished. She is cooperative.  HENT:  Head: Normocephalic and atraumatic.  Mouth/Throat: Oropharynx is clear and moist and mucous membranes are normal.  Eyes: Pupils are equal, round, and reactive to light. Conjunctivae are normal.  Cardiovascular: Normal rate, regular rhythm and normal heart sounds.  Pulmonary/Chest: Effort normal and breath sounds normal.  Neurological: She is alert and oriented to person, place, and time. Gait normal.  Skin: Skin is warm, dry and intact.  Psychiatric: She has a normal mood and affect. Her speech is normal and behavior is normal. Judgment and thought content normal. Cognition and memory are normal.  Nursing note and vitals reviewed.   Assessment   1. Chest pain  2. Lupus  3. HM  4. Thrombocytopenia ? Med related I.e plaquenil, related to autoimmune d/o vs other  Plan  1.  appt sch 07/13/18 Dr. Josefa Half will ask if he would like me to refer [facility] cards for angiogram or if he will  2.  Referred to Dr. Lorretta Harp Rheumatology in [facility] Refilled folic acid want appt 03/8526  Results for RODRIGUEZ-OCASIO, [place] (MRN [id number]) as of 07/06/2018 10:17  Ref. Range 07/04/2018 05:07  Sodium Latest Ref Range: 135 - 145 mmol/L 144  Potassium Latest Ref Range: 3.5 - 5.1 mmol/L 3.7  Chloride Latest Ref Range: 98 - 111 mmol/L 113 (H)  CO2 Latest Ref Range: 22 - 32 mmol/L 25  Glucose Latest Ref Range: 70 - 99 mg/dL 104 (H)  BUN Latest Ref Range: 6 - 20 mg/dL 13  Creatinine Latest Ref Range: 0.44 - 1.00 mg/dL 0.75  Calcium Latest Ref Range: 8.9 - 10.3 mg/dL 8.5 (L)  Anion gap Latest Ref Range: 5 - 15  6  GFR, Est Non African  American Latest Ref Range: >60 mL/min >60  GFR, Est African American Latest Ref Range: >60 mL/min >60  WBC Latest Ref Range: 3.6 - 11.0 K/uL 4.2  RBC Latest Ref Range: 3.80 - 5.20 MIL/uL 3.91  Hemoglobin Latest Ref Range: 12.0 - 16.0 g/dL 11.4 (L)  HCT Latest Ref Range: 35.0 - 47.0 % 35.1  MCV Latest Ref Range: 80.0 - 100.0 fL 89.7  MCH Latest Ref Range:  26.0 - 34.0 pg 29.2  MCHC Latest Ref Range: 32.0 - 36.0 g/dL 32.5  RDW Latest Ref Range: 11.5 - 14.5 % 14.8 (H)  Platelets Latest Ref Range: 150 - 440 K/uL 127 (L)   Results for RODRIGUEZ-OCASIO, Mattie (MRN [id number]) as of 07/06/2018 10:17  Ref. Range 11/05/2017 10:58  VITD Latest Ref Range: 30.00 - 100.00 ng/mL 43.35  Results for RODRIGUEZ-OCASIO, Eveleen (MRN [id number]) as of 07/06/2018 10:17  Ref. Range 11/05/2017 10:58  TSH Latest Ref Range: 0.35 - 4.50 uIU/mL 3.92  T4,Free(Direct) Latest Ref Range: 0.60 - 1.60 ng/dL 0.72  Results for RODRIGUEZ-OCASIO, Rebekkah (MRN [id number]) as of 07/06/2018 10:17  Ref. Range 07/04/2018 05:07  HIV Screen 4th Generation wRfx Latest Ref Range: Non Reactive  Non Reactive  Results for RODRIGUEZ-OCASIO, Dao (MRN [id number]) as of 07/06/2018 10:17  Ref. Range 03/29/2018 14:19  Appearance Latest Ref Range: CLEAR  CLEAR (A)  Bilirubin Urine Latest Ref Range: NEGATIVE  NEGATIVE  Color, Urine Latest Ref Range: YELLOW  YELLOW (A)  Hgb urine dipstick Latest Ref Range: NEGATIVE  NEGATIVE  Ketones, ur Latest Ref Range: NEGATIVE mg/dL NEGATIVE  Leukocytes, UA Latest Ref Range: NEGATIVE  NEGATIVE  Nitrite Latest Ref Range: NEGATIVE  NEGATIVE  pH Latest Ref Range: 5.0 - 8.0  7.0  Protein Latest Ref Range: NEGATIVE mg/dL NEGATIVE  Specific Gravity, Urine Latest Ref Range: 1.005 - 1.030  1.012   Checked further labs today will fax when resulted  Of note low plts maybe related plaquenil vs lupus related   3.  Had flu shot  Had pna 23, prevnar, Tdap  Think about shingrix vaccine disc today  Immune MMR and hep B  See other  HM 11/03/17  Colonoscopy 2015 h/o polys DEXA 06/29/17 osteopenia Mammogram 06/11/17 neg and 06/15/18 mammo neg  Pap 07/01/18 Dr. Marcelline Mates neg pap neg HPV   Of note CT 09/2017 with ground glass appearance will need repeat in 09/2018 never smoker  Continue f/u with psychiatry will mail copy of labs to pt or fax copy to psychiatry as wanted TSH, lipid, A1C results    4. CBC and path smear review today  Provider: Dr. Olivia Mackie McLean-Scocuzza-Internal Medicine

## 2018-07-07 LAB — MEASLES/MUMPS/RUBELLA IMMUNITY
Mumps IgG: 197 AU/mL
RUBEOLA IGG: 132 [AU]/ml
Rubella: 5.5 index

## 2018-07-07 LAB — HEPATITIS B SURFACE ANTIBODY, QUANTITATIVE: Hepatitis B-Post: 140 m[IU]/mL (ref >=10)

## 2018-07-07 LAB — CBC WITH DIFFERENTIAL/PLATELET

## 2018-07-07 LAB — PATHOLOGIST SMEAR REVIEW

## 2018-07-10 ENCOUNTER — Other Ambulatory Visit: Payer: Self-pay | Admitting: Internal Medicine

## 2018-07-10 DIAGNOSIS — D696 Thrombocytopenia, unspecified: Secondary | ICD-10-CM

## 2018-07-11 ENCOUNTER — Telehealth: Payer: Self-pay

## 2018-07-11 DIAGNOSIS — I951 Orthostatic hypotension: Secondary | ICD-10-CM

## 2018-07-11 NOTE — Progress Notes (Signed)
Fax has been sent

## 2018-07-11 NOTE — Telephone Encounter (Signed)
Copied from [place] [phone]. Topic: General - Other >> Jul 11, 2018  3:51 PM Carolyn Stare wrote:  Pt is having a Angio gram done on 07/27/18 and they would like for her to have a pregnancy test and creatine done before 07/19/18 Will need an order placed

## 2018-07-11 NOTE — Addendum Note (Signed)
Addended by: Burnard Hawthorne on: 07/11/2018 04:54 PM   Modules accepted: Orders

## 2018-07-11 NOTE — Telephone Encounter (Signed)
Labs ordered Let pt know.

## 2018-07-12 ENCOUNTER — Telehealth: Payer: Self-pay | Admitting: Internal Medicine

## 2018-07-12 NOTE — Telephone Encounter (Signed)
Pt dropped off [place] disability form to be filled out. Placed in Dr. Nicki Reaper colored folder upfront  Please fax to [phone] when completed

## 2018-07-12 NOTE — Telephone Encounter (Signed)
mychart message has been sent to patient

## 2018-07-13 NOTE — Telephone Encounter (Signed)
Paperwork has been placed on providers desk .

## 2018-07-13 NOTE — Telephone Encounter (Signed)
Called patient to make her aware that PCP was out of the office for 2 weeks and see when the form was due. Form is not due for another month. I have placed back up front with a sticky note on the charge form. Patient did not have a problem with waiting for it to be completed.

## 2018-07-18 ENCOUNTER — Other Ambulatory Visit: Payer: Self-pay

## 2018-07-18 ENCOUNTER — Encounter: Payer: Self-pay | Admitting: Emergency Medicine

## 2018-07-18 ENCOUNTER — Emergency Department: Payer: Medicare Other

## 2018-07-18 ENCOUNTER — Emergency Department
Admission: EM | Admit: 2018-07-18 | Discharge: 2018-07-18 | Disposition: A | Discharge status: Home / Self Care | Payer: Medicare Other | Attending: Emergency Medicine | Admitting: Emergency Medicine

## 2018-07-18 DIAGNOSIS — Z7982 Long term (current) use of aspirin: Secondary | ICD-10-CM | POA: Diagnosis not present

## 2018-07-18 DIAGNOSIS — R079 Chest pain, unspecified: Secondary | ICD-10-CM | POA: Insufficient documentation

## 2018-07-18 DIAGNOSIS — Z79899 Other long term (current) drug therapy: Secondary | ICD-10-CM | POA: Diagnosis not present

## 2018-07-18 DIAGNOSIS — Z91040 Latex allergy status: Secondary | ICD-10-CM | POA: Insufficient documentation

## 2018-07-18 DIAGNOSIS — E039 Hypothyroidism, unspecified: Secondary | ICD-10-CM | POA: Insufficient documentation

## 2018-07-18 DIAGNOSIS — R51 Headache: Secondary | ICD-10-CM | POA: Diagnosis not present

## 2018-07-18 DIAGNOSIS — E119 Type 2 diabetes mellitus without complications: Secondary | ICD-10-CM | POA: Insufficient documentation

## 2018-07-18 DIAGNOSIS — J45909 Unspecified asthma, uncomplicated: Secondary | ICD-10-CM | POA: Insufficient documentation

## 2018-07-18 DIAGNOSIS — G43009 Migraine without aura, not intractable, without status migrainosus: Secondary | ICD-10-CM

## 2018-07-18 DIAGNOSIS — R0789 Other chest pain: Secondary | ICD-10-CM | POA: Diagnosis not present

## 2018-07-18 LAB — CBC
HEMATOCRIT: 37.6 % (ref 35.0–47.0)
Hemoglobin: 12.4 g/dL (ref 12.0–16.0)
MCH: 29.7 pg (ref 26.0–34.0)
MCHC: 33.1 g/dL (ref 32.0–36.0)
MCV: 89.8 fL (ref 80.0–100.0)
Platelets: 156 10*3/uL (ref 150–440)
RBC: 4.19 MIL/uL (ref 3.80–5.20)
RDW: 15 % — ABNORMAL HIGH (ref 11.5–14.5)
WBC: 5.7 10*3/uL (ref 3.6–11.0)

## 2018-07-18 LAB — BASIC METABOLIC PANEL
Anion gap: 7 (ref 5–15)
BUN: 17 mg/dL (ref 6–20)
CHLORIDE: 109 mmol/L (ref 98–111)
CO2: 25 mmol/L (ref 22–32)
Calcium: 8.8 mg/dL — ABNORMAL LOW (ref 8.9–10.3)
Creatinine, Ser: 1.02 mg/dL — ABNORMAL HIGH (ref 0.44–1.00)
GFR calc non Af Amer: >60 mL/min (ref >60)
Glucose, Bld: 91 mg/dL (ref 70–99)
POTASSIUM: 3.8 mmol/L (ref 3.5–5.1)
Sodium: 141 mmol/L (ref 135–145)

## 2018-07-18 LAB — TROPONIN I: Troponin I: <0.03 ng/mL (ref <0.03)

## 2018-07-18 MED ORDER — METOCLOPRAMIDE HCL 5 MG/ML IJ SOLN
10.0000 mg | Freq: Once | INTRAMUSCULAR | Status: AC
  Administered 2018-07-18: 10 mg via INTRAVENOUS
  Filled 2018-07-18: qty 2

## 2018-07-18 MED ORDER — DIPHENHYDRAMINE HCL 50 MG/ML IJ SOLN
50.0000 mg | Freq: Once | INTRAMUSCULAR | Status: AC
  Administered 2018-07-18: 50 mg via INTRAVENOUS
  Filled 2018-07-18: qty 1

## 2018-07-18 MED ORDER — KETOROLAC TROMETHAMINE 30 MG/ML IJ SOLN
30.0000 mg | Freq: Once | INTRAMUSCULAR | Status: AC
  Administered 2018-07-18: 30 mg via INTRAVENOUS
  Filled 2018-07-18: qty 1

## 2018-07-18 MED ORDER — SODIUM CHLORIDE 0.9 % IV BOLUS
1000.0000 mL | Freq: Once | INTRAVENOUS | Status: AC
  Administered 2018-07-18: 1000 mL via INTRAVENOUS

## 2018-07-18 NOTE — ED Triage Notes (Signed)
Patient presents to the ED and reports headache, chest pain, and joint pain x 2 days.  Patient states she attempted to see her cardiologist after being sent there from her pain management doctor.  Cardiologist was not available and clinic staff instructed patient to come to the ED.  Patient reports chest tightness that is intermittent and does not radiate.  Patient reports headache is severe with nausea and dizziness.

## 2018-07-18 NOTE — ED Provider Notes (Signed)
[facility] Emergency Department Provider Note  Time seen: 2:31 PM  I have reviewed the triage vital signs and the nursing notes.   HISTORY  Chief Complaint Headache and Chest Pain    HPI [name] [name] is a 52 y.o. female with a past medical history of ADHD, anxiety, chronic pain, fibromyalgia, lupus, migraine headaches on daily Topamax who presents to the emergency department for headache.  According to the patient for the past 2 days she has been experiencing intermittent chest pain which she describes as a sharp pain to the center of her chest.  Denies any trouble breathing or diaphoresis.  Does state nausea.  Also states for the past 2 days she has been experiencing a significant headache which she describes as 10 out of 10.  Has a history of migraines is on daily Topamax.  Patient attempted to go to cardiology because of the chest pain but they could not see her today so she came to the emergency department.  Patient does see pain management and is currently wearing a fentanyl patch.  Denies any fever.   Past Medical History:  Diagnosis Date  . ADHD   . Anemia   . ANXIETY 06/06/2007  . Arthritis    related to lupus  . Asthma   . BACK PAIN 02/22/2009  . Cervical cancer ([place])    LEEP 2005   . Chicken pox   . Chronic pain    goes to pain management provider  . Collagen vascular disease ([place])   . DEPRESSION 06/06/2007  . Essential tremor   . Fibromyalgia   . Fibromyalgia   . FREQUENCY, URINARY 07/07/2007  . Gastritis   . GERD 06/06/2007  . Glaucoma   . Headache    h/o migraines   . History of hiatal hernia   . History of kidney stones   . HYPOTHYROIDISM 06/06/2007  . INTERSTITIAL CYSTITIS 09/11/2010  . Lupus ([place]) 2006  . Lupus ([place])   . Menopause   . Migraine   . Mitral valve regurgitation   . NEPHROLITHIASIS, HX OF 11/05/2010  . Neuropathy   . Neuropathy   . OCD (obsessive compulsive disorder)   . Optic neuritis    2005  . OSTEOPENIA  10/14/2009  . Osteoporosis   . OVERACTIVE BLADDER 10/14/2009  . PAIN, CHRONIC NEC 06/06/2007  . Panniculitis   . POLYARTHRITIS 08/19/2007  . Raynaud's disease   . Sicca syndrome ([place])   . 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 Vaginal prolapse    Dr. Marcelline Mates [facility]   . Vasculitis ([place])   . WEIGHT GAIN 02/22/2009    Patient Active Problem List   Diagnosis Date Noted  . Thrombocytopenia ([place]) 07/06/2018  . Chest pain 07/04/2018  . Allergic rhinitis 12/16/2017  . Dizziness 12/16/2017  . Orthostatic hypotension 12/16/2017  . Generalized abdominal pain 12/16/2017  . Constipation 12/16/2017  . Acute nonintractable headache 12/13/2017  . Fibromyalgia 11/04/2017  . Status post hardware removal 02/16/2017  . Sicca syndrome ([place]) 06/04/2014  . Abdominal pain, epigastric 09/08/2013  . Type II or unspecified type diabetes mellitus without mention of complication, uncontrolled 03/15/2013  . UTI (urinary tract infection) 02/15/2013  . Chronic pain 03/31/2011  . NEPHROLITHIASIS, HX OF 11/05/2010  . INTERSTITIAL CYSTITIS 09/11/2010  . Overactive bladder 10/14/2009  . Disorder of bone and cartilage 10/14/2009  . Backache 02/22/2009  . WEIGHT GAIN 02/22/2009  . 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 POLYARTHRITIS 08/19/2007  . FREQUENCY, URINARY 07/07/2007  . Hypothyroidism 06/06/2007  .  Anxiety state 06/06/2007  . Depression, recurrent ([place]) 06/06/2007  . PAIN, CHRONIC NEC 06/06/2007  . GERD 06/06/2007    Past Surgical History:  Procedure Laterality Date  . APPENDECTOMY    . CERVICAL BIOPSY  W/ LOOP ELECTRODE EXCISION  2005  . CHOLECYSTECTOMY    . ESOPHAGOGASTRODUODENOSCOPY (EGD) WITH PROPOFOL N/A 08/27/2017   Procedure: ESOPHAGOGASTRODUODENOSCOPY (EGD) WITH PROPOFOL;  Surgeon: Manya Silvas, MD;  Location: [facility] ENDOSCOPY;  Service: Endoscopy;  Laterality: N/A;  . HAND SURGERY     repair of left metatarsal   . HARDWARE REMOVAL Right 02/16/2017   Procedure: HARDWARE REMOVAL  FROM HIP;  Surgeon: Hessie Knows, MD;  Location: [facility] ORS;  Service: Orthopedics;  Laterality: Right;  . JOINT REPLACEMENT    . OTHER SURGICAL HISTORY     left 3rd metatarsal fracture repair 2011   . REVISION TOTAL HIP ARTHROPLASTY  06/2009   replacement 2010 then screw and plate removal 2992; right hip  . TONSILLECTOMY    . TONSILLECTOMY    . wrist  ligament repair bilateral    . WRIST RECONSTRUCTION    . WRIST SURGERY     laceration of right wrist ligaments  . wrist surgery     laceration of left wrist surgery     Prior to Admission medications   Medication Sig Start Date End Date Taking? Authorizing Provider  albuterol (VENTOLIN HFA) 108 (90 Base) MCG/ACT inhaler Ventolin HFA 90 mcg/actuation aerosol inhaler  INHALE 2 PUFFS (180 MCG) BY INHALATION ROUTE EVERY 4 HOURS    [provider]  ARIPiprazole (ABILIFY) 20 MG tablet Take 20 mg by mouth daily.    [provider]  Ascorbic Acid (VITAMIN C) 500 MG CHEW Chew 2,500 mg by mouth daily.    [provider]  aspirin 325 MG tablet Take 1 tablet (325 mg total) by mouth daily. 02/18/17   Duanne Guess, PA-C  Biotin 10 MG CAPS Take by mouth.    [provider]  Biotin 5000 MCG SUBL Place 10,000 mcg under the tongue daily.    [provider]  budesonide-formoterol (SYMBICORT) 160-4.5 MCG/ACT inhaler Symbicort 160 mcg-4.5 mcg/actuation HFA aerosol inhaler    [provider]  butalbital-acetaminophen-caffeine (FIORICET, ESGIC) 50-325-40 MG tablet Take 1-2 tablets by mouth every 6 (six) hours as needed for headache. 02/25/18 02/25/19  Harvest Dark, MD  calcipotriene (DOVONOX) 0.005 % ointment calcipotriene 0.005 % topical ointment  APPLY A THIN LAYER TO THE AFFECTED AREA(S) BY TOPICAL ROUTE ONCE DAILY ; RUB IN GENTLY AND COMPLETELY    [provider]  Cholecalciferol (VITAMIN D3) 2000 units capsule Vitamin D3 2,000 unit capsule    [provider]  dapsone 25 MG tablet  Take by mouth daily.     [provider]  dexmethylphenidate (FOCALIN XR) 20 MG 24 hr capsule dexmethylphenidate ER 20 mg capsule,extended release biphasic50-50    [provider]  diclofenac sodium (VOLTAREN) 1 % GEL Apply 2 g topically 4 (four) times daily as needed (for knee & finger pain.).     [provider]  dicyclomine (BENTYL) 10 MG capsule dicyclomine 10 mg capsule  every 8hours as needed    [provider]  DULoxetine (CYMBALTA) 60 MG capsule Take 60 mg by mouth 2 (two) times daily.    [provider]  etodolac (LODINE) 500 MG tablet etodolac 500 mg tablet    [provider]  fentaNYL (DURAGESIC - DOSED MCG/HR) 75 MCG/HR Place 75 mcg onto the skin every  3 (three) days.    [provider]  fluocinonide ointment (LIDEX) 0.05 % fluocinonide 0.05 % topical ointment  APPLY TO THE AFFECTED AREA(S) BY TOPICAL ROUTE 2 TIMES PER DAY    [provider]  folic acid (FOLVITE) 1 MG tablet Take 1 tablet (1 mg total) by mouth daily. 07/06/18   McLean-Scocuzza, Nino Glow, MD  hydrocortisone (PROCTOZONE-HC) 2.5 % rectal cream Proctozone-HC 2.5 % topical cream perineal applicator    [provider]  hydroxychloroquine (PLAQUENIL) 200 MG tablet Plaquenil 200 mg tablet  Take 1 tablet twice a day by oral route after meals for 90 days.    [provider]  ibuprofen (ADVIL,MOTRIN) 800 MG tablet Take 1 tablet (800 mg total) by mouth every 8 (eight) hours as needed. 08/04/17   Laban Emperor, PA-C  imiquimod (ALDARA) 5 % cream imiquimod 5 % topical cream packet  APPLY TO THE AFFECTED AREA(S) BY TOPICAL ROUTE 3 TIMES PER WEEK at bedtime    [provider]  isosorbide mononitrate (IMDUR) 60 MG 24 hr tablet Take 1 tablet (60 mg total) by mouth daily. 05/26/18   Carrie Mew, MD  ketorolac (TORADOL) 10 MG tablet Take 1 tablet (10 mg total) every 8 (eight) hours as needed by mouth for moderate pain (with food). 10/11/17    Eula Listen, MD  levothyroxine (SYNTHROID, LEVOTHROID) 25 MCG tablet Take 1 tablet (25 mcg total) by mouth daily before breakfast. 05/13/18   McLean-Scocuzza, Nino Glow, MD  lidocaine (LIDODERM) 5 % Place 1 patch onto the skin every 12 (twelve) hours. Remove & Discard patch within 12 hours or as directed by MD 08/04/17 08/04/18  Laban Emperor, PA-C  LORazepam (ATIVAN) 2 MG tablet Take 1-2 mg by mouth See admin instructions. 1 MG DAILY AS NEEDED FOR ANXIETY & SCHEDULED AT BEDTIME EACH NIGHT    [provider]  methylphenidate 18 MG PO CR tablet Take by mouth.     [provider]  montelukast (SINGULAIR) 10 MG tablet montelukast 10 mg tablet 1 pill qhs 12/13/17   McLean-Scocuzza, Nino Glow, MD  Multiple Vitamins-Minerals (MULTI-VITAMIN GUMMIES PO) Multi Vitamin  GUMMIES    [provider]  ondansetron (ZOFRAN) 4 MG tablet Zofran 8 mg tablet  Take 1 tablet every 8 hours by oral route as needed.    [provider]  pantoprazole (PROTONIX) 40 MG tablet Take one tablet by mouth twice daily Patient taking differently: TAKE 1 TABLET (40 MG) BY MOUTH ONCE DAILY IN THE MORNING. 12/18/13   Marletta Lor, MD  PARoxetine (PAXIL) 10 MG tablet Take 1 tablet (10 mg total) by mouth at bedtime. 07/01/18   Rubie Maid, MD  polyethylene glycol powder (GLYCOLAX/MIRALAX) powder Take 17 g by mouth daily. 03/29/18   Harvest Dark, MD  Polyvinyl Alcohol-Povidone (REFRESH OP) Place 1 drop into both eyes 2 (two) times daily.    [provider]  predniSONE (DELTASONE) 10 MG tablet prednisone 10 mg tablet  1 daily    [provider]  Probiotic Product (PROBIOTIC PO) Probiotic 10 billion cell capsule  daily    [provider]  silver sulfADIAZINE (SILVADENE) 1 % cream silver sulfadiazine 1 % topical cream    [provider]  Sodium Chloride 3 % AERS Saline Nasal Mist 3 %  spray nose deeply every hour or two and blow nose    [provider]  solifenacin (VESICARE) 5 MG tablet Take 1 tablet (5 mg total) by mouth daily. 12/13/17   McLean-Scocuzza,  Nino Glow, MD  sucralfate (CARAFATE) 1 g tablet sucralfate 1 gram tablet    [provider]  topiramate (TOPAMAX) 100 MG tablet topiramate 100 mg tablet  TAKE 1 TABLET BY MOUTH 2 TIMES DAILY.    [provider]  valACYclovir (VALTREX) 500 MG tablet valacyclovir 500 mg tablet  1 daily    [provider]    Allergies  Allergen Reactions  . Bee Venom Itching and Swelling    Affected area Affected area Affected area Affected area   . Fish Allergy Hives, Swelling and Rash    Facial swelling  Facial swelling  Facial swelling Facial swelling  Facial swelling   . Latex Anaphylaxis, Rash and Shortness Of Breath    Rash  Rash    . Prasterone Other (See Comments) and Nausea And Vomiting    rash Other reaction(s): Headache Headaches.  . Shellfish Allergy Hives, Other (See Comments), Rash and Swelling    Facial swelling Uncoded Allergy. Allergen: seafood Uncoded Allergy. Allergen: CATS, Other Reaction: itch, wheezing Uncoded Allergy. Allergen: COMPAZINE, Other Reaction: tremors Facial swelling Facial swelling Uncoded Allergy. Allergen: seafood Uncoded Allergy. Allergen: CATS, Other Reaction: itch, wheezing Uncoded Allergy. Allergen: COMPAZINE, Other Reaction: tremors Facial swelling Uncoded Allergy. Allergen: seafood Uncoded Allergy. Allergen: CATS, Other Reaction: itch, wheezing Uncoded Allergy. Allergen: COMPAZINE, Other Reaction: tremors Facial swelling Facial swelling Uncoded Allergy. Allergen: seafood Uncoded Allergy. Allergen: CATS, Other Reaction: itch, wheezing Uncoded Allergy. Allergen: COMPAZINE, Other Reaction: tremors Facial swelling Facial swelling Uncoded Allergy. Allergen: seafood Uncoded Allergy. Allergen: CATS, Other Reaction: itch, wheezing Uncoded Allergy. Allergen: COMPAZINE, Other Reaction: tremors   .  Shellfish-Derived Products Hives, Other (See Comments), Rash and Swelling    Facial swelling Uncoded Allergy. Allergen: seafood Uncoded Allergy. Allergen: CATS, Other Reaction: itch, wheezing Uncoded Allergy. Allergen: COMPAZINE, Other Reaction: tremors Facial swelling Facial swelling Uncoded Allergy. Allergen: seafood Uncoded Allergy. Allergen: CATS, Other Reaction: itch, wheezing Uncoded Allergy. Allergen: COMPAZINE, Other Reaction: tremors Facial swelling Uncoded Allergy. Allergen: seafood Uncoded Allergy. Allergen: CATS, Other Reaction: itch, wheezing Uncoded Allergy. Allergen: COMPAZINE, Other Reaction: tremors Facial swelling  . Compazine [Prochlorperazine Edisylate] Other (See Comments)    tremors  . Dhea [Nutritional Supplements] Other (See Comments)    Headaches.  . Dilaudid [Hydromorphone Hcl]     ? reaction  . Lactose   . Lactose Intolerance (Gi)   . Other Other (See Comments)    Other reaction(s): Unknown   . Prochlorperazine     Other reaction(s): Other (See Comments) ticks  . Promethazine     Other reaction(s): Other (See Comments) Ticks  . Promethazine Hcl Other (See Comments)    CNS disorder  . Reclast [Zoledronic Acid]     Weakness could not move limbs, fatigue, increase sleep  . Clarithromycin Hives and Rash  . Hydromorphone Hcl Rash and Hives    "Rash all over"  . Penicillins Rash and Other (See Comments)    Has patient had a PCN reaction causing immediate rash, facial/tongue/throat swelling, SOB or lightheadedness with hypotension:No Has patient had a PCN reaction causing severe rash involving mucus membranes or skin necrosis:No Has patient had a PCN reaction that required hospitalization:No Has patient had a PCN reaction occurring within the last 10 years:No If all of the above answers are "NO", then may proceed with Cephalosporin use.    Family History  Problem Relation Age of Onset  . Hypertension Mother   . Arthritis Mother   . Heart disease  Mother  afib  . Asthma Sister   . Asthma Daughter   . Arthritis Daughter   . Heart disease Daughter        ?heart condition on BB  . ADD / ADHD Daughter   . Pancreatic cancer Father   . Cancer Father        pancreatitic   . Diabetes Maternal Grandmother   . Heart disease Maternal Grandmother   . Arthritis Maternal Grandmother   . Hypertension Maternal Grandmother   . Colon cancer Maternal Uncle 85  . Crohn's disease Maternal Aunt   . Diabetes Maternal Aunt   . Breast cancer Cousin 16       maternal  . Cancer Cousin        m cousin breat cancer s/p removal both breasts     Social History Social History   Tobacco Use  . Smoking status: Never Smoker  . Smokeless tobacco: Never Used  Substance Use Topics  . Alcohol use: No  . Drug use: No    Review of Systems Constitutional: Negative for fever. Eyes: Positive for photophobia ENT: Negative for recent illness/congestion Cardiovascular: Negative for chest pain. Respiratory: Negative for shortness of breath. Gastrointestinal: Negative for abdominal pain, vomiting.  Positive for nausea Musculoskeletal: Negative for musculoskeletal complaints Skin: Negative for skin complaints  Neurological: Severe headache.  Denies weakness or numbness confusion or slurred speech All other ROS negative  ____________________________________________   PHYSICAL EXAM:  VITAL SIGNS: ED Triage Vitals  Enc Vitals Group     BP 07/18/18 1310 127/72     Pulse Rate 07/18/18 1310 92     Resp 07/18/18 1310 20     Temp 07/18/18 1310 98.5 F (36.9 C)     Temp Source 07/18/18 1310 Oral     SpO2 07/18/18 1310 96 %     Weight 07/18/18 1311 227 lb (103 kg)     Height 07/18/18 1311 5\' 10"  (1.778 m)     Head Circumference --      Peak Flow --      Pain Score 07/18/18 1321 9     Pain Loc --      Pain Edu? --      Excl. in [place]? --    Constitutional: Alert and oriented.  Sitting in bed does appear to be uncomfortable holding her  head. Eyes: Positive for photophobia ENT   Head: Normocephalic and atraumatic.   Mouth/Throat: Mucous membranes are moist. Cardiovascular: Normal rate, regular rhythm. No murmur Respiratory: Normal respiratory effort without tachypnea nor retractions. Breath sounds are clear Gastrointestinal: Soft and nontender. No distention Musculoskeletal: Nontender with normal range of motion in all extremities.  Neurologic:  Normal speech and language. No gross focal neurologic deficits  Skin:  Skin is warm, dry and intact.  Psychiatric: Mood and affect are normal. Speech and behavior are normal.   ____________________________________________    EKG  EKG reviewed and interpreted by myself shows normal sinus rhythm at 94 bpm with a narrow QRS, normal axis, largely normal intervals with nonspecific ST changes without ST elevation.  ____________________________________________    RADIOLOGY  Chest x-ray negative  CT scan head is negative  ____________________________________________   INITIAL IMPRESSION / ASSESSMENT AND PLAN / ED COURSE  Pertinent labs & imaging results that were available during my care of the patient were reviewed by me and considered in my medical decision making (see chart for details).  Patient presents to the emergency department for headache as well as 2 days of chest pain.  As far as the patient's chest pain it is nonreproducible, denies any diaphoresis or dyspnea but does state mild nausea.  Patient's work-up including labs, troponin, EKG and chest x-ray are reassuring.  As far as the patient's headache she states a history of chronic migraines takes daily Topamax.  Patient states that she has tried over-the-counter medications as well as her Topamax without relief of this headache.  States it feels somewhat different from her typical migraine because it is located in the back of her head and so the front of her head.  It does have nausea which is typical of her  migraines.  We will obtain a CT scan of the head as a precaution.  I discussed this with the patient she is agreeable to this plan of care.  We will also dose Toradol, Reglan, Benadryl as well as IV fluids and continue to closely monitor.  Patient agreeable to this plan of care.  CT scan of the head is negative.  We will continue with migraine treatment and continue to reassess.  Patient care signed out to oncoming provider.   ____________________________________________   FINAL CLINICAL IMPRESSION(S) / ED DIAGNOSES  Chest pain Headache    Harvest Dark, MD 07/18/18 1459

## 2018-07-19 ENCOUNTER — Encounter: Payer: Self-pay | Admitting: Emergency Medicine

## 2018-07-19 ENCOUNTER — Emergency Department: Payer: Medicare Other

## 2018-07-19 ENCOUNTER — Emergency Department
Admission: EM | Admit: 2018-07-19 | Discharge: 2018-07-19 | Disposition: A | Discharge status: Home / Self Care | Payer: Medicare Other | Attending: Emergency Medicine | Admitting: Emergency Medicine

## 2018-07-19 DIAGNOSIS — Z91040 Latex allergy status: Secondary | ICD-10-CM | POA: Insufficient documentation

## 2018-07-19 DIAGNOSIS — Z79899 Other long term (current) drug therapy: Secondary | ICD-10-CM | POA: Insufficient documentation

## 2018-07-19 DIAGNOSIS — E039 Hypothyroidism, unspecified: Secondary | ICD-10-CM | POA: Insufficient documentation

## 2018-07-19 DIAGNOSIS — M79604 Pain in right leg: Secondary | ICD-10-CM | POA: Diagnosis not present

## 2018-07-19 DIAGNOSIS — M79605 Pain in left leg: Secondary | ICD-10-CM | POA: Diagnosis not present

## 2018-07-19 DIAGNOSIS — Z7982 Long term (current) use of aspirin: Secondary | ICD-10-CM | POA: Insufficient documentation

## 2018-07-19 DIAGNOSIS — M25562 Pain in left knee: Secondary | ICD-10-CM | POA: Diagnosis not present

## 2018-07-19 DIAGNOSIS — M25561 Pain in right knee: Secondary | ICD-10-CM | POA: Diagnosis not present

## 2018-07-19 NOTE — ED Notes (Signed)
McShane MD at bedside. 

## 2018-07-19 NOTE — Discharge Instructions (Addendum)
Return to the emergency room for any new or worrisome symptoms, use your pain prescriptions as directed.  If you have fever, increased pain, numbness, weakness or other concerns return to the emergency department.  Sometimes, a repeat ultrasound might be necessary in a week based on what your doctor see you so please call your doctor tomorrow for an appointment.  If any other concerns or issues please return to the emergency room

## 2018-07-19 NOTE — ED Triage Notes (Signed)
Pt seen in ED on Monday for chest pain/HA and joint pain and discharged. Pt back to ED today due to sharpe pain behind bilateral knees and radiates downward. No swelling. Not hot to the touch. Pt has hx/o Lupus.

## 2018-07-19 NOTE — ED Provider Notes (Signed)
[facility] Emergency Department Provider Note  ____________________________________________   I have reviewed the triage vital signs and the nursing notes. Where available I have reviewed prior notes and, if possible and indicated, outside hospital notes.    HISTORY  Chief Complaint Leg Pain    HPI [name] [name] is a 52 y.o. female history of chronic pain, fibromyalgia, lupus, other medical problems with chronic recurrent visits to the emergency room for pain related complaints with no admissions over the last year, seen here last night for completely unrelated complaint of migraine.  States her migraines better but when she went home, she started having some cramping in her legs.  She wants to make sure if things okay there.  She denies any trauma.  No numbness no weakness.  States that she has pain behind her knees.  However did not fall.  No history of blood clots, no other complaints no chest pain or shortness of breath at this time able to ambulate, and is wearing her fentanyl patch     Past Medical History:  Diagnosis Date  . ADHD   . Anemia   . ANXIETY 06/06/2007  . Arthritis    related to lupus  . Asthma   . BACK PAIN 02/22/2009  . Cervical cancer ([place])    LEEP 2005   . Chicken pox   . Chronic pain    goes to pain management provider  . Collagen vascular disease ([place])   . DEPRESSION 06/06/2007  . Essential tremor   . Fibromyalgia   . Fibromyalgia   . FREQUENCY, URINARY 07/07/2007  . Gastritis   . GERD 06/06/2007  . Glaucoma   . Headache    h/o migraines   . History of hiatal hernia   . History of kidney stones   . HYPOTHYROIDISM 06/06/2007  . INTERSTITIAL CYSTITIS 09/11/2010  . Lupus ([place]) 2006  . Lupus ([place])   . Menopause   . Migraine   . Mitral valve regurgitation   . NEPHROLITHIASIS, HX OF 11/05/2010  . Neuropathy   . Neuropathy   . OCD (obsessive compulsive disorder)   . Optic neuritis    2005  . OSTEOPENIA 10/14/2009  .  Osteoporosis   . OVERACTIVE BLADDER 10/14/2009  . PAIN, CHRONIC NEC 06/06/2007  . Panniculitis   . POLYARTHRITIS 08/19/2007  . Raynaud's disease   . Sicca syndrome ([place])   . 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 Vaginal prolapse    Dr. Marcelline Mates [facility]   . Vasculitis ([place])   . WEIGHT GAIN 02/22/2009    Patient Active Problem List   Diagnosis Date Noted  . Thrombocytopenia ([place]) 07/06/2018  . Chest pain 07/04/2018  . Allergic rhinitis 12/16/2017  . Dizziness 12/16/2017  . Orthostatic hypotension 12/16/2017  . Generalized abdominal pain 12/16/2017  . Constipation 12/16/2017  . Acute nonintractable headache 12/13/2017  . Fibromyalgia 11/04/2017  . Status post hardware removal 02/16/2017  . Sicca syndrome ([place]) 06/04/2014  . Abdominal pain, epigastric 09/08/2013  . Type II or unspecified type diabetes mellitus without mention of complication, uncontrolled 03/15/2013  . UTI (urinary tract infection) 02/15/2013  . Chronic pain 03/31/2011  . NEPHROLITHIASIS, HX OF 11/05/2010  . INTERSTITIAL CYSTITIS 09/11/2010  . Overactive bladder 10/14/2009  . Disorder of bone and cartilage 10/14/2009  . Backache 02/22/2009  . WEIGHT GAIN 02/22/2009  . 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 POLYARTHRITIS 08/19/2007  . FREQUENCY, URINARY 07/07/2007  . Hypothyroidism 06/06/2007  . Anxiety state 06/06/2007  . Depression, recurrent ([place])  06/06/2007  . PAIN, CHRONIC NEC 06/06/2007  . GERD 06/06/2007    Past Surgical History:  Procedure Laterality Date  . APPENDECTOMY    . CERVICAL BIOPSY  W/ LOOP ELECTRODE EXCISION  2005  . CHOLECYSTECTOMY    . ESOPHAGOGASTRODUODENOSCOPY (EGD) WITH PROPOFOL N/A 08/27/2017   Procedure: ESOPHAGOGASTRODUODENOSCOPY (EGD) WITH PROPOFOL;  Surgeon: Manya Silvas, MD;  Location: [facility] ENDOSCOPY;  Service: Endoscopy;  Laterality: N/A;  . HAND SURGERY     repair of left metatarsal   . HARDWARE REMOVAL Right 02/16/2017   Procedure: HARDWARE REMOVAL FROM HIP;   Surgeon: Hessie Knows, MD;  Location: [facility] ORS;  Service: Orthopedics;  Laterality: Right;  . JOINT REPLACEMENT    . OTHER SURGICAL HISTORY     left 3rd metatarsal fracture repair 2011   . REVISION TOTAL HIP ARTHROPLASTY  06/2009   replacement 2010 then screw and plate removal 6213; right hip  . TONSILLECTOMY    . TONSILLECTOMY    . wrist  ligament repair bilateral    . WRIST RECONSTRUCTION    . WRIST SURGERY     laceration of right wrist ligaments  . wrist surgery     laceration of left wrist surgery     Prior to Admission medications   Medication Sig Start Date End Date Taking? Authorizing Provider  albuterol (VENTOLIN HFA) 108 (90 Base) MCG/ACT inhaler Ventolin HFA 90 mcg/actuation aerosol inhaler  INHALE 2 PUFFS (180 MCG) BY INHALATION ROUTE EVERY 4 HOURS    [provider]  ARIPiprazole (ABILIFY) 20 MG tablet Take 20 mg by mouth daily.    [provider]  Ascorbic Acid (VITAMIN C) 500 MG CHEW Chew 2,500 mg by mouth daily.    [provider]  aspirin 325 MG tablet Take 1 tablet (325 mg total) by mouth daily. 02/18/17   Duanne Guess, PA-C  Biotin 10 MG CAPS Take by mouth.    [provider]  Biotin 5000 MCG SUBL Place 10,000 mcg under the tongue daily.    [provider]  budesonide-formoterol (SYMBICORT) 160-4.5 MCG/ACT inhaler Symbicort 160 mcg-4.5 mcg/actuation HFA aerosol inhaler    [provider]  butalbital-acetaminophen-caffeine (FIORICET, ESGIC) 50-325-40 MG tablet Take 1-2 tablets by mouth every 6 (six) hours as needed for headache. 02/25/18 02/25/19  Harvest Dark, MD  calcipotriene (DOVONOX) 0.005 % ointment calcipotriene 0.005 % topical ointment  APPLY A THIN LAYER TO THE AFFECTED AREA(S) BY TOPICAL ROUTE ONCE DAILY ; RUB IN GENTLY AND COMPLETELY    [provider]  Cholecalciferol (VITAMIN D3) 2000 units capsule Vitamin D3 2,000 unit capsule    [provider]  dapsone 25 MG tablet Take by  mouth daily.     [provider]  dexmethylphenidate (FOCALIN XR) 20 MG 24 hr capsule dexmethylphenidate ER 20 mg capsule,extended release biphasic50-50    [provider]  diclofenac sodium (VOLTAREN) 1 % GEL Apply 2 g topically 4 (four) times daily as needed (for knee & finger pain.).     [provider]  dicyclomine (BENTYL) 10 MG capsule dicyclomine 10 mg capsule  every 8hours as needed    [provider]  DULoxetine (CYMBALTA) 60 MG capsule Take 60 mg by mouth 2 (two) times daily.    [provider]  etodolac (LODINE) 500 MG tablet etodolac 500 mg tablet    [provider]  fentaNYL (DURAGESIC - DOSED MCG/HR) 75 MCG/HR Place 75 mcg onto the skin every 3 (three) days.    [provider]  fluocinonide ointment (LIDEX) 0.05 % fluocinonide 0.05 % topical ointment  APPLY TO THE AFFECTED AREA(S) BY TOPICAL ROUTE 2 TIMES PER DAY    [provider]  folic acid (FOLVITE) 1 MG tablet Take 1 tablet (1 mg total) by mouth daily. 07/06/18   McLean-Scocuzza, Nino Glow, MD  hydrocortisone (PROCTOZONE-HC) 2.5 % rectal cream Proctozone-HC 2.5 % topical cream perineal applicator    [provider]  hydroxychloroquine (PLAQUENIL) 200 MG tablet Plaquenil 200 mg tablet  Take 1 tablet twice a day by oral route after meals for 90 days.    [provider]  ibuprofen (ADVIL,MOTRIN) 800 MG tablet Take 1 tablet (800 mg total) by mouth every 8 (eight) hours as needed. 08/04/17   Laban Emperor, PA-C  imiquimod (ALDARA) 5 % cream imiquimod 5 % topical cream packet  APPLY TO THE AFFECTED AREA(S) BY TOPICAL ROUTE 3 TIMES PER WEEK at bedtime    [provider]  isosorbide mononitrate (IMDUR) 60 MG 24 hr tablet Take 1 tablet (60 mg total) by mouth daily. 05/26/18   Carrie Mew, MD  ketorolac (TORADOL) 10 MG tablet Take 1 tablet (10 mg total) every 8 (eight) hours as needed by mouth for moderate pain (with food). 10/11/17   Eula Listen, MD  levothyroxine (SYNTHROID, LEVOTHROID) 25 MCG tablet Take 1 tablet (25 mcg total) by mouth daily before breakfast. 05/13/18   McLean-Scocuzza, Nino Glow, MD  lidocaine (LIDODERM) 5 % Place 1 patch onto the skin every 12 (twelve) hours. Remove & Discard patch within 12 hours or as directed by MD 08/04/17 08/04/18  Laban Emperor, PA-C  LORazepam (ATIVAN) 2 MG tablet Take 1-2 mg by mouth See admin instructions. 1 MG DAILY AS NEEDED FOR ANXIETY & SCHEDULED AT BEDTIME EACH NIGHT    [provider]  methylphenidate 18 MG PO CR tablet Take by mouth.     [provider]  montelukast (SINGULAIR) 10 MG tablet montelukast 10 mg tablet 1 pill qhs 12/13/17   McLean-Scocuzza, Nino Glow, MD  Multiple Vitamins-Minerals (MULTI-VITAMIN GUMMIES PO) Multi Vitamin  GUMMIES    [provider]  ondansetron (ZOFRAN) 4 MG tablet Zofran 8 mg tablet  Take 1 tablet every 8 hours by oral route as needed.    [provider]  pantoprazole (PROTONIX) 40 MG tablet Take one tablet by mouth twice daily Patient taking differently: TAKE 1 TABLET (40 MG) BY MOUTH ONCE DAILY IN THE MORNING. 12/18/13   Marletta Lor, MD  PARoxetine (PAXIL) 10 MG tablet Take 1 tablet (10 mg total) by mouth at bedtime. 07/01/18   Rubie Maid, MD  polyethylene glycol powder (GLYCOLAX/MIRALAX) powder Take 17 g by mouth daily. 03/29/18   Harvest Dark, MD  Polyvinyl Alcohol-Povidone (REFRESH OP) Place 1 drop into both eyes 2 (two) times daily.    [provider]  predniSONE (DELTASONE) 10 MG tablet prednisone 10 mg tablet  1 daily    [provider]  Probiotic Product (PROBIOTIC PO) Probiotic 10 billion cell capsule  daily    [provider]  silver sulfADIAZINE (SILVADENE) 1 % cream silver sulfadiazine 1 % topical cream    [provider]  Sodium Chloride 3 % AERS Saline Nasal Mist 3 %  spray nose deeply every hour or two and blow nose    [provider]   solifenacin (VESICARE) 5 MG tablet Take 1 tablet (5 mg total) by mouth daily. 12/13/17   McLean-Scocuzza, Nino Glow, MD  sucralfate (CARAFATE) 1 g  tablet sucralfate 1 gram tablet    [provider]  topiramate (TOPAMAX) 100 MG tablet topiramate 100 mg tablet  TAKE 1 TABLET BY MOUTH 2 TIMES DAILY.    [provider]  valACYclovir (VALTREX) 500 MG tablet valacyclovir 500 mg tablet  1 daily    [provider]    Allergies Bee venom; Fish allergy; Latex; Prasterone; Shellfish allergy; Shellfish-derived products; Compazine [prochlorperazine edisylate]; Dhea [nutritional supplements]; Dilaudid [hydromorphone hcl]; Lactose; Lactose intolerance (gi); Other; Prochlorperazine; Promethazine; Promethazine hcl; Reclast [zoledronic acid]; Clarithromycin; Hydromorphone hcl; and Penicillins  Family History  Problem Relation Age of Onset  . Hypertension Mother   . Arthritis Mother   . Heart disease Mother        afib  . Asthma Sister   . Asthma Daughter   . Arthritis Daughter   . Heart disease Daughter        ?heart condition on BB  . ADD / ADHD Daughter   . Pancreatic cancer Father   . Cancer Father        pancreatitic   . Diabetes Maternal Grandmother   . Heart disease Maternal Grandmother   . Arthritis Maternal Grandmother   . Hypertension Maternal Grandmother   . Colon cancer Maternal Uncle 76  . Crohn's disease Maternal Aunt   . Diabetes Maternal Aunt   . Breast cancer Cousin 31       maternal  . Cancer Cousin        m cousin breat cancer s/p removal both breasts     Social History Social History   Tobacco Use  . Smoking status: Never Smoker  . Smokeless tobacco: Never Used  Substance Use Topics  . Alcohol use: No  . Drug use: No    Review of Systems Constitutional: No fever/chills Eyes: No visual changes. ENT: No sore throat. No stiff neck no neck pain Cardiovascular: Denies chest pain. Respiratory: Denies shortness of breath. Gastrointestinal:    no vomiting.  No diarrhea.  No constipation. Genitourinary: Negative for dysuria. Musculoskeletal: Negative lower extremity swelling Skin: Negative for rash. Neurological: Negative for severe headaches, focal weakness or numbness.   ____________________________________________   PHYSICAL EXAM:  VITAL SIGNS: ED Triage Vitals [07/19/18 1912]  Enc Vitals Group     BP 125/67     Pulse Rate 92     Resp 18     Temp 98 F (36.7 C)     Temp Source Oral     SpO2 97 %     Weight 227 lb (103 kg)     Height      Head Circumference      Peak Flow      Pain Score 9     Pain Loc      Pain Edu?      Excl. in [place]?     Constitutional: Alert and oriented. Well appearing and in no acute distress. Eyes: Conjunctivae are normal Head: Atraumatic HEENT: No congestion/rhinnorhea. Mucous membranes are moist.  Oropharynx non-erythematous Neck:   Nontender with no meningismus, no masses, no stridor Cardiovascular: Normal rate, regular rhythm. Grossly normal heart sounds.  Good peripheral circulation. Respiratory: Normal respiratory effort.  No retractions. Lungs CTAB. Abdominal: Soft and nontender. No distention. No guarding no rebound Back:  There is no focal tenderness or step off.  there is no midline tenderness there are no lesions noted. there is no CVA tenderness Musculoskeletal slight tenderness to palpation to the bilateral calves however they are not rather not swollen they  are not erythematous the compartments are soft she has 2+ bilateral dorsal pedal and posterior tibial pulses she has full painless range of motion there is no evidence of cellulitis there is no evidence of compartment syndrome there is no evidence of any pathology they were both normal in appearance., no upper extremity tenderness. No joint effusions, no DVT signs strong distal pulses no edema Neurologic:  Normal speech and language. No gross focal neurologic deficits are appreciated.  Skin:  Skin is warm, dry and intact.  No rash noted. Psychiatric: Mood and affect are normal. Speech and behavior are normal.  ____________________________________________   LABS (all labs ordered are listed, but only abnormal results are displayed)  Labs Reviewed - No data to display  Pertinent labs  results that were available during my care of the patient were reviewed by me and considered in my medical decision making (see chart for details). ____________________________________________  EKG  I personally interpreted any EKGs ordered by me or triage  ____________________________________________  RADIOLOGY  Pertinent labs & imaging results that were available during my care of the patient were reviewed by me and considered in my medical decision making (see chart for details). If possible, patient and/or family made aware of any abnormal findings.  US Venous Img Lower Bilateral  Result Date: 07/19/2018 CLINICAL DATA:  Sharp pain behind both knees for 24 hours EXAM: BILATERAL LOWER EXTREMITY VENOUS DOPPLER ULTRASOUND TECHNIQUE: Gray-scale sonography with graded compression, as well as color Doppler and duplex ultrasound were performed to evaluate the lower extremity deep venous systems from the level of the common femoral vein and including the common femoral, femoral, profunda femoral, popliteal and calf veins including the posterior tibial, peroneal and gastrocnemius veins when visible. The superficial great saphenous vein was also interrogated. Spectral Doppler was utilized to evaluate flow at rest and with distal augmentation maneuvers in the common femoral, femoral and popliteal veins. COMPARISON:  LEFT lower extremity venous ultrasound of 12/05/2012, RIGHT lower extremity venous ultrasound 06/26/2009 FINDINGS: RIGHT LOWER EXTREMITY Common Femoral Vein: No evidence of thrombus. Normal compressibility, respiratory phasicity and response to augmentation. Saphenofemoral Junction: No evidence of thrombus. Normal  compressibility and flow on color Doppler imaging. Profunda Femoral Vein: No evidence of thrombus. Normal compressibility and flow on color Doppler imaging. Femoral Vein: No evidence of thrombus. Normal compressibility, respiratory phasicity and response to augmentation. Popliteal Vein: No evidence of thrombus. Normal compressibility, respiratory phasicity and response to augmentation. Calf Veins: No evidence of thrombus. Normal compressibility and flow on color Doppler imaging. Superficial Great Saphenous Vein: No evidence of thrombus. Normal compressibility. Venous Reflux:  None. Other Findings:  None. LEFT LOWER EXTREMITY Common Femoral Vein: No evidence of thrombus. Normal compressibility, respiratory phasicity and response to augmentation. Saphenofemoral Junction: No evidence of thrombus. Normal compressibility and flow on color Doppler imaging. Profunda Femoral Vein: No evidence of thrombus. Normal compressibility and flow on color Doppler imaging. Femoral Vein: No evidence of thrombus. Normal compressibility, respiratory phasicity and response to augmentation. Popliteal Vein: No evidence of thrombus. Normal compressibility, respiratory phasicity and response to augmentation. Calf Veins: No evidence of thrombus. Normal compressibility and flow on color Doppler imaging. Superficial Great Saphenous Vein: No evidence of thrombus. Normal compressibility. Venous Reflux:  None. Other Findings:  None. IMPRESSION: No evidence of deep venous thrombosis in either lower extremity. Electronically Signed   By: Lavonia Dana M.D.   On: 07/19/2018 20:39   ____________________________________________    PROCEDURES  Procedure(s) performed: None  Procedures  Critical Care performed: None  ____________________________________________   INITIAL IMPRESSION / ASSESSMENT AND PLAN / ED COURSE  Pertinent labs & imaging results that were available during my care of the patient were reviewed by me and considered in my  medical decision making (see chart for details).  Patient here because she is having leg pain today, bilateral, no trauma no fall.  Seen here last night with normal electrolytes normal blood work I do not think repeating it is of utility.  We did however do a Doppler ultrasound of bilateral lower extremities which is negative.  There is no evidence of compartment syndrome there is no evidence of significant pathology noted.  Patient has multiple chronic recurrent visits for pain, and at this time I am very relieved to see that there is no acute pathology requiring narcotics fortunately.  Obviously there is a slight chance she could have DVTs that are deep in the calf which we would miss perhaps on an ultrasound and therefore we have advised that she follow-up closely with primary care for repeat in a week if this is still positively bothering her.  Given that there is no trauma and no evidence of trauma no stigmata of trauma I see no indication for imaging or x-ray.  She has no evidence of compartment syndrome.  No evidence of bilateral cellulitis or vascular insufficiency PVD, has excellent pulses and no evidence of infection she was seen to walk at her baseline.  We will discharge her, with close outpatient follow-up with PCP.  Very reassuring work-up and exam.  Return precautions given and understood.    ____________________________________________   FINAL CLINICAL IMPRESSION(S) / ED DIAGNOSES  Final diagnoses:  None      This chart was dictated using voice recognition software.  Despite best efforts to proofread,  errors can occur which can change meaning.      Schuyler Amor, MD 07/19/18 2123

## 2018-07-20 DIAGNOSIS — Z0189 Encounter for other specified special examinations: Secondary | ICD-10-CM | POA: Diagnosis not present

## 2018-07-26 DIAGNOSIS — F3181 Bipolar II disorder: Secondary | ICD-10-CM | POA: Diagnosis not present

## 2018-07-27 DIAGNOSIS — R0602 Shortness of breath: Secondary | ICD-10-CM | POA: Diagnosis not present

## 2018-07-27 DIAGNOSIS — M329 Systemic lupus erythematosus, unspecified: Secondary | ICD-10-CM | POA: Diagnosis not present

## 2018-07-27 DIAGNOSIS — R079 Chest pain, unspecified: Secondary | ICD-10-CM | POA: Diagnosis not present

## 2018-07-27 DIAGNOSIS — R9439 Abnormal result of other cardiovascular function study: Secondary | ICD-10-CM | POA: Diagnosis not present

## 2018-07-28 DIAGNOSIS — R0789 Other chest pain: Secondary | ICD-10-CM | POA: Diagnosis not present

## 2018-07-28 DIAGNOSIS — I2510 Atherosclerotic heart disease of native coronary artery without angina pectoris: Secondary | ICD-10-CM | POA: Diagnosis not present

## 2018-08-04 ENCOUNTER — Encounter: Payer: Self-pay | Admitting: Internal Medicine

## 2018-08-04 DIAGNOSIS — H409 Unspecified glaucoma: Secondary | ICD-10-CM | POA: Insufficient documentation

## 2018-08-04 DIAGNOSIS — G43909 Migraine, unspecified, not intractable, without status migrainosus: Secondary | ICD-10-CM | POA: Insufficient documentation

## 2018-08-04 DIAGNOSIS — F909 Attention-deficit hyperactivity disorder, unspecified type: Secondary | ICD-10-CM | POA: Insufficient documentation

## 2018-08-04 DIAGNOSIS — M8580 Other specified disorders of bone density and structure, unspecified site: Secondary | ICD-10-CM | POA: Insufficient documentation

## 2018-08-04 DIAGNOSIS — J45909 Unspecified asthma, uncomplicated: Secondary | ICD-10-CM | POA: Insufficient documentation

## 2018-08-04 DIAGNOSIS — G250 Essential tremor: Secondary | ICD-10-CM

## 2018-08-04 DIAGNOSIS — F429 Obsessive-compulsive disorder, unspecified: Secondary | ICD-10-CM | POA: Insufficient documentation

## 2018-08-04 DIAGNOSIS — I340 Nonrheumatic mitral (valve) insufficiency: Secondary | ICD-10-CM | POA: Insufficient documentation

## 2018-08-04 DIAGNOSIS — M329 Systemic lupus erythematosus, unspecified: Secondary | ICD-10-CM | POA: Insufficient documentation

## 2018-08-04 DIAGNOSIS — G629 Polyneuropathy, unspecified: Secondary | ICD-10-CM | POA: Insufficient documentation

## 2018-08-04 DIAGNOSIS — R32 Unspecified urinary incontinence: Secondary | ICD-10-CM | POA: Insufficient documentation

## 2018-08-11 NOTE — Telephone Encounter (Signed)
Patient calling about lab work

## 2018-08-11 NOTE — Telephone Encounter (Signed)
Pt is calling checking on status of paperwork. Pt needs forms before oct 2019

## 2018-08-15 ENCOUNTER — Telehealth: Payer: Self-pay

## 2018-08-15 NOTE — Telephone Encounter (Signed)
Patient stated she has no questions about lab work.

## 2018-08-15 NOTE — Telephone Encounter (Signed)
Spoken to patient. She has paperwork up front.

## 2018-08-15 NOTE — Telephone Encounter (Signed)
Copied from [place] [id number]. Topic: General - Other >> Aug 15, 2018  9:13 AM Yvette Rack wrote: Reason for CRM: pt calling stating that she brought some FMLA paperwork in a month ago from Dr Aundra Dubin -Jacklynn Lewis to fill out she states that she need paper work this month if you could give her a call back at [phone]

## 2018-08-19 ENCOUNTER — Ambulatory Visit (INDEPENDENT_AMBULATORY_CARE_PROVIDER_SITE_OTHER): Payer: Medicare Other | Admitting: *Deleted

## 2018-08-19 DIAGNOSIS — Z23 Encounter for immunization: Secondary | ICD-10-CM | POA: Diagnosis not present

## 2018-08-19 NOTE — Telephone Encounter (Signed)
mychart message has been sent to inform patient that I am refaxing paperwork.

## 2018-08-19 NOTE — Telephone Encounter (Signed)
Pt says that she spoke with disability and they advise to be sure to include all disabilities on ppwk. Pt says that her disabilities include, Lupus, neuropathy, chronic migraines, anxiety, depression, pt says that she walk with a cane and she has vasculitis.    Pt's deadline for ppwk is the beginning of Oct

## 2018-08-19 NOTE — Telephone Encounter (Signed)
Fransisco Beau call the patient [name] [name] already filled this out  What else does she need?  [place]

## 2018-08-19 NOTE — Telephone Encounter (Signed)
Patient calling about paper work

## 2018-08-25 DIAGNOSIS — M546 Pain in thoracic spine: Secondary | ICD-10-CM | POA: Diagnosis not present

## 2018-08-25 DIAGNOSIS — M8580 Other specified disorders of bone density and structure, unspecified site: Secondary | ICD-10-CM | POA: Diagnosis not present

## 2018-08-25 DIAGNOSIS — Z9189 Other specified personal risk factors, not elsewhere classified: Secondary | ICD-10-CM | POA: Diagnosis not present

## 2018-08-25 DIAGNOSIS — M329 Systemic lupus erythematosus, unspecified: Secondary | ICD-10-CM | POA: Diagnosis not present

## 2018-08-31 ENCOUNTER — Telehealth: Payer: Self-pay | Admitting: Internal Medicine

## 2018-08-31 ENCOUNTER — Other Ambulatory Visit: Payer: Self-pay | Admitting: Internal Medicine

## 2018-08-31 DIAGNOSIS — M130 Polyarthritis, unspecified: Secondary | ICD-10-CM

## 2018-08-31 DIAGNOSIS — M329 Systemic lupus erythematosus, unspecified: Secondary | ICD-10-CM | POA: Diagnosis not present

## 2018-08-31 DIAGNOSIS — R002 Palpitations: Secondary | ICD-10-CM | POA: Diagnosis not present

## 2018-08-31 DIAGNOSIS — R0602 Shortness of breath: Secondary | ICD-10-CM | POA: Diagnosis not present

## 2018-08-31 NOTE — Telephone Encounter (Signed)
Referred to Dr. Tomasa Blase in [place]   [place]

## 2018-08-31 NOTE — Telephone Encounter (Signed)
Pt would like to be referred to a new rheumatologist  She didn't like the doctor at [facility] an would like a New one placed ASAP because she will be  out of her meds by next month

## 2018-09-05 DIAGNOSIS — M23221 Derangement of posterior horn of medial meniscus due to old tear or injury, right knee: Secondary | ICD-10-CM | POA: Diagnosis not present

## 2018-09-09 ENCOUNTER — Other Ambulatory Visit: Payer: Self-pay | Admitting: Orthopedic Surgery

## 2018-09-09 DIAGNOSIS — R0602 Shortness of breath: Secondary | ICD-10-CM | POA: Diagnosis not present

## 2018-09-09 DIAGNOSIS — R079 Chest pain, unspecified: Secondary | ICD-10-CM | POA: Diagnosis not present

## 2018-09-09 DIAGNOSIS — I7300 Raynaud's syndrome without gangrene: Secondary | ICD-10-CM | POA: Diagnosis not present

## 2018-09-09 DIAGNOSIS — M25562 Pain in left knee: Secondary | ICD-10-CM

## 2018-09-09 DIAGNOSIS — G459 Transient cerebral ischemic attack, unspecified: Secondary | ICD-10-CM | POA: Diagnosis not present

## 2018-09-09 DIAGNOSIS — R002 Palpitations: Secondary | ICD-10-CM | POA: Diagnosis not present

## 2018-09-20 DIAGNOSIS — F3181 Bipolar II disorder: Secondary | ICD-10-CM | POA: Diagnosis not present

## 2018-09-24 ENCOUNTER — Ambulatory Visit
Admission: RE | Admit: 2018-09-24 | Discharge: 2018-09-24 | Disposition: A | Discharge status: Home / Self Care | Payer: Medicare Other | Source: Ambulatory Visit | Attending: Orthopedic Surgery | Admitting: Orthopedic Surgery

## 2018-09-24 DIAGNOSIS — M2242 Chondromalacia patellae, left knee: Secondary | ICD-10-CM | POA: Insufficient documentation

## 2018-09-24 DIAGNOSIS — M238X2 Other internal derangements of left knee: Secondary | ICD-10-CM | POA: Insufficient documentation

## 2018-09-24 DIAGNOSIS — M25562 Pain in left knee: Secondary | ICD-10-CM | POA: Diagnosis not present

## 2018-09-24 DIAGNOSIS — M7122 Synovial cyst of popliteal space [Baker], left knee: Secondary | ICD-10-CM | POA: Diagnosis not present

## 2018-09-24 DIAGNOSIS — M25462 Effusion, left knee: Secondary | ICD-10-CM | POA: Insufficient documentation

## 2018-09-26 DIAGNOSIS — F3181 Bipolar II disorder: Secondary | ICD-10-CM | POA: Diagnosis not present

## 2018-09-28 DIAGNOSIS — F419 Anxiety disorder, unspecified: Secondary | ICD-10-CM | POA: Diagnosis not present

## 2018-09-28 DIAGNOSIS — F3181 Bipolar II disorder: Secondary | ICD-10-CM | POA: Diagnosis not present

## 2018-09-29 DIAGNOSIS — K5909 Other constipation: Secondary | ICD-10-CM | POA: Diagnosis not present

## 2018-09-29 DIAGNOSIS — G8929 Other chronic pain: Secondary | ICD-10-CM | POA: Diagnosis not present

## 2018-09-29 DIAGNOSIS — R1013 Epigastric pain: Secondary | ICD-10-CM | POA: Diagnosis not present

## 2018-09-29 DIAGNOSIS — K760 Fatty (change of) liver, not elsewhere classified: Secondary | ICD-10-CM | POA: Diagnosis not present

## 2018-09-29 DIAGNOSIS — R1310 Dysphagia, unspecified: Secondary | ICD-10-CM | POA: Diagnosis not present

## 2018-09-29 DIAGNOSIS — K219 Gastro-esophageal reflux disease without esophagitis: Secondary | ICD-10-CM | POA: Diagnosis not present

## 2018-10-03 ENCOUNTER — Telehealth: Payer: Self-pay | Admitting: Internal Medicine

## 2018-10-03 NOTE — Telephone Encounter (Signed)
Attempted to call patient to schedule AWV. Patient did not answer; no voicemail set up, could not leave message. SF

## 2018-10-06 ENCOUNTER — Encounter: Payer: Self-pay | Admitting: Internal Medicine

## 2018-10-06 ENCOUNTER — Ambulatory Visit: Payer: Medicare Other | Admitting: Internal Medicine

## 2018-10-06 VITALS — BP 110/68 | HR 98 | Temp 98.8°F | Ht 70.0 in | Wt 234.8 lb

## 2018-10-06 DIAGNOSIS — R079 Chest pain, unspecified: Secondary | ICD-10-CM | POA: Diagnosis not present

## 2018-10-06 DIAGNOSIS — M329 Systemic lupus erythematosus, unspecified: Secondary | ICD-10-CM | POA: Diagnosis not present

## 2018-10-06 DIAGNOSIS — E039 Hypothyroidism, unspecified: Secondary | ICD-10-CM

## 2018-10-06 DIAGNOSIS — G8929 Other chronic pain: Secondary | ICD-10-CM

## 2018-10-06 DIAGNOSIS — M3500 Sicca syndrome, unspecified: Secondary | ICD-10-CM

## 2018-10-06 DIAGNOSIS — K449 Diaphragmatic hernia without obstruction or gangrene: Secondary | ICD-10-CM | POA: Diagnosis not present

## 2018-10-06 DIAGNOSIS — F411 Generalized anxiety disorder: Secondary | ICD-10-CM | POA: Diagnosis not present

## 2018-10-06 DIAGNOSIS — M797 Fibromyalgia: Secondary | ICD-10-CM

## 2018-10-06 MED ORDER — LEVOTHYROXINE SODIUM 25 MCG PO TABS: 25.0000 ug | ORAL_TABLET | Freq: Every day | ORAL | 3 refills | Status: DC

## 2018-10-06 MED ORDER — ALBUTEROL SULFATE HFA 108 (90 BASE) MCG/ACT IN AERS: 1.0000 | INHALATION_SPRAY | RESPIRATORY_TRACT | 12 refills | Status: DC | PRN

## 2018-10-06 NOTE — Patient Instructions (Signed)

## 2018-10-06 NOTE — Progress Notes (Signed)
Pre visit review using our clinic review tool, if applicable. No additional management support is needed unless otherwise documented below in the visit note. 

## 2018-10-06 NOTE — Progress Notes (Signed)
Chief Complaint  Patient presents with  . Follow-up   F/u with mom 1. Hypothyroidism needs refill of levo 25 mcg qd  2. Autoimmune d/o, chronic pain fibromyalgia pt requests a new rheumatologist and joint pain is not controlled and wants to consider being on MTX to help sx's. Sent referral to Dr. Lemar Lofty in [place] he request pt go to [facility]  3. Chest pain improved since on omeprazole 40 mg bid cardiac angiogram 07/28/18 negative no CAD will f/u with Dr. Lennie Hummer 01/10/2019 she does also report a lot of stress and anxiety due to social reasons but she recently saw psychiatry and psychology      Review of Systems  Constitutional: Negative for weight loss.  HENT: Negative for hearing loss.   Eyes: Negative for blurred vision.  Respiratory: Negative for shortness of breath.   Cardiovascular: Negative for chest pain.  Gastrointestinal: Negative for abdominal pain.  Musculoskeletal: Positive for joint pain.  Skin: Negative for rash.  Neurological: Negative for headaches.  Psychiatric/Behavioral: The patient is nervous/anxious.    Past Medical History:  Diagnosis Date  . ADHD   . Anemia   . ANXIETY 06/06/2007  . Arthritis    related to lupus  . Asthma   . BACK PAIN 02/22/2009  . Cervical cancer ([place])    LEEP 2005   . Chicken pox   . Chronic pain    goes to pain management provider  . Collagen vascular disease ([place])   . DEPRESSION 06/06/2007  . Essential tremor   . Fibromyalgia   . Fibromyalgia   . FREQUENCY, URINARY 07/07/2007  . Gastritis   . GERD 06/06/2007  . Glaucoma   . Headache    h/o migraines   . History of hiatal hernia   . History of kidney stones   . HYPOTHYROIDISM 06/06/2007  . INTERSTITIAL CYSTITIS 09/11/2010  . Lupus ([place]) 2006  . Lupus ([place])   . Menopause   . Migraine   . Mitral valve regurgitation   . NEPHROLITHIASIS, HX OF 11/05/2010  . Neuropathy   . Neuropathy   . OCD (obsessive compulsive disorder)   . Optic neuritis    2005  . OSTEOPENIA 10/14/2009  .  Osteoporosis   . OVERACTIVE BLADDER 10/14/2009  . PAIN, CHRONIC NEC 06/06/2007  . Panniculitis   . POLYARTHRITIS 08/19/2007  . Raynaud's disease   . Sicca syndrome ([place])   . 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 Vaginal prolapse    Dr. Marcelline Mates [facility]   . Vasculitis ([place])   . WEIGHT GAIN 02/22/2009   Past Surgical History:  Procedure Laterality Date  . APPENDECTOMY    . CERVICAL BIOPSY  W/ LOOP ELECTRODE EXCISION  2005  . CHOLECYSTECTOMY    . ESOPHAGOGASTRODUODENOSCOPY (EGD) WITH PROPOFOL N/A 08/27/2017   Procedure: ESOPHAGOGASTRODUODENOSCOPY (EGD) WITH PROPOFOL;  Surgeon: Manya Silvas, MD;  Location: [facility] ENDOSCOPY;  Service: Endoscopy;  Laterality: N/A;  . HAND SURGERY     repair of left metatarsal   . HARDWARE REMOVAL Right 02/16/2017   Procedure: HARDWARE REMOVAL FROM HIP;  Surgeon: Hessie Knows, MD;  Location: [facility] ORS;  Service: Orthopedics;  Laterality: Right;  . JOINT REPLACEMENT    . OTHER SURGICAL HISTORY     left 3rd metatarsal fracture repair 2011   . REVISION TOTAL HIP ARTHROPLASTY  06/2009   replacement 2010 then screw and plate removal 9147; right hip  . TONSILLECTOMY    . TONSILLECTOMY    . wrist  ligament repair bilateral    .  WRIST RECONSTRUCTION    . WRIST SURGERY     laceration of right wrist ligaments  . wrist surgery     laceration of left wrist surgery    Family History  Problem Relation Age of Onset  . Hypertension Mother   . Arthritis Mother   . Heart disease Mother        afib  . Asthma Sister   . Asthma Daughter   . Arthritis Daughter   . Heart disease Daughter        ?heart condition on BB  . ADD / ADHD Daughter   . Pancreatic cancer Father   . Cancer Father        pancreatitic   . Diabetes Maternal Grandmother   . Heart disease Maternal Grandmother   . Arthritis Maternal Grandmother   . Hypertension Maternal Grandmother   . Colon cancer Maternal Uncle 73  . Crohn's disease Maternal Aunt   . Diabetes Maternal Aunt   . Breast  cancer Cousin 55       maternal  . Cancer Cousin        m cousin breat cancer s/p removal both breasts    Social History   Socioeconomic History  . Marital status: Divorced    Spouse name: Not on file  . Number of children: 1  . Years of education: Not on file  . Highest education level: Not on file  Occupational History  . Occupation: DISABLED    Employer: UNEMPLOYED  Social Needs  . Financial resource strain: Not on file  . Food insecurity:    Worry: Not on file    Inability: Not on file  . Transportation needs:    Medical: Not on file    Non-medical: Not on file  Tobacco Use  . Smoking status: Never Smoker  . Smokeless tobacco: Never Used  Substance and Sexual Activity  . Alcohol use: No  . Drug use: No  . Sexual activity: Not Currently    Birth control/protection: None, Post-menopausal  Lifestyle  . Physical activity:    Days per week: Not on file    Minutes per session: Not on file  . Stress: Not on file  Relationships  . Social connections:    Talks on phone: Not on file    Gets together: Not on file    Attends religious service: Not on file    Active member of club or organization: Not on file    Attends meetings of clubs or organizations: Not on file    Relationship status: Not on file  . Intimate partner violence:    Fear of current or ex partner: Not on file    Emotionally abused: Not on file    Physically abused: Not on file    Forced sexual activity: Not on file  Other Topics Concern  . 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      No outpatient medications have been marked as taking for the 10/06/18 encounter (Office Visit) with McLean-Scocuzza, Nino Glow, MD.   Allergies  Allergen Reactions  . Bee Venom Itching and Swelling    Affected area Affected area Affected area Affected area   . Fish Allergy Hives,  Swelling and Rash    Facial swelling  Facial swelling  Facial swelling Facial swelling  Facial swelling   . Latex Anaphylaxis,  Rash and Shortness Of Breath    Rash  Rash    . Prasterone Other (See Comments) and Nausea And Vomiting    rash Other reaction(s): Headache Headaches.  . Shellfish Allergy Hives, Other (See Comments), Rash and Swelling    Facial swelling Uncoded Allergy. Allergen: seafood Uncoded Allergy. Allergen: CATS, Other Reaction: itch, wheezing Uncoded Allergy. Allergen: COMPAZINE, Other Reaction: tremors Facial swelling Facial swelling Uncoded Allergy. Allergen: seafood Uncoded Allergy. Allergen: CATS, Other Reaction: itch, wheezing Uncoded Allergy. Allergen: COMPAZINE, Other Reaction: tremors Facial swelling Uncoded Allergy. Allergen: seafood Uncoded Allergy. Allergen: CATS, Other Reaction: itch, wheezing Uncoded Allergy. Allergen: COMPAZINE, Other Reaction: tremors Facial swelling Facial swelling Uncoded Allergy. Allergen: seafood Uncoded Allergy. Allergen: CATS, Other Reaction: itch, wheezing Uncoded Allergy. Allergen: COMPAZINE, Other Reaction: tremors Facial swelling Facial swelling Uncoded Allergy. Allergen: seafood Uncoded Allergy. Allergen: CATS, Other Reaction: itch, wheezing Uncoded Allergy. Allergen: COMPAZINE, Other Reaction: tremors   . Shellfish-Derived Products Hives, Other (See Comments), Rash and Swelling    Facial swelling Uncoded Allergy. Allergen: seafood Uncoded Allergy. Allergen: CATS, Other Reaction: itch, wheezing Uncoded Allergy. Allergen: COMPAZINE, Other Reaction: tremors Facial swelling Facial swelling Uncoded Allergy. Allergen: seafood Uncoded Allergy. Allergen: CATS, Other Reaction: itch, wheezing Uncoded Allergy. Allergen: COMPAZINE, Other Reaction: tremors Facial swelling Uncoded Allergy. Allergen: seafood Uncoded Allergy. Allergen: CATS, Other Reaction: itch, wheezing Uncoded Allergy. Allergen: COMPAZINE, Other  Reaction: tremors Facial swelling  . Compazine [Prochlorperazine Edisylate] Other (See Comments)    tremors  . Dhea [Nutritional Supplements] Other (See Comments)    Headaches.  . Dilaudid [Hydromorphone Hcl]     ? reaction  . Lactose   . Lactose Intolerance (Gi)   . Other Other (See Comments)    Other reaction(s): Unknown   . Prochlorperazine     Other reaction(s): Other (See Comments) ticks  . Promethazine     Other reaction(s): Other (See Comments) Ticks  . Promethazine Hcl Other (See Comments)    CNS disorder  . Reclast [Zoledronic Acid]     Weakness could not move limbs, fatigue, increase sleep  . Clarithromycin Hives and Rash  . Hydromorphone Hcl Rash and Hives    "Rash all over"  . Penicillins Rash and Other (See Comments)    Has patient had a PCN reaction causing immediate rash, facial/tongue/throat swelling, SOB or lightheadedness with hypotension:No Has patient had a PCN reaction causing severe rash involving mucus membranes or skin necrosis:No Has patient had a PCN reaction that required hospitalization:No Has patient had a PCN reaction occurring within the last 10 years:No If all of the above answers are "NO", then may proceed with Cephalosporin use.   Recent Results (from the past 2160 hour(s))  Basic metabolic panel     Status: Abnormal   Collection Time: 07/18/18  1:11 PM  Result Value Ref Range   Sodium 141 135 - 145 mmol/L   Potassium 3.8 3.5 - 5.1 mmol/L   Chloride 109 98 - 111 mmol/L   CO2 25 22 - 32 mmol/L   Glucose, Bld 91 70 - 99 mg/dL   BUN 17 6 - 20 mg/dL   Creatinine, Ser 1.02 (H) 0.44 - 1.00 mg/dL   Calcium 8.8 (L) 8.9 - 10.3 mg/dL   GFR calc non Af Amer >60 >60 mL/min   GFR calc Af Amer >60 >60 mL/min    Comment: (NOTE) The eGFR has been calculated using the CKD EPI equation. This calculation has not been validated in all clinical situations. eGFR's persistently <60 mL/min signify possible Chronic Kidney  Disease.    Anion gap 7 5 - 15     Comment: Performed at [facility], [address] [place], [place] [address]  CBC     Status: Abnormal   Collection Time: 07/18/18  1:11 PM  Result Value Ref Range   WBC 5.7 3.6 - 11.0 K/uL   RBC 4.19 3.80 - 5.20 MIL/uL   Hemoglobin 12.4 12.0 - 16.0 g/dL   HCT 37.6 35.0 - 47.0 %   MCV 89.8 80.0 - 100.0 fL   MCH 29.7 26.0 - 34.0 pg   MCHC 33.1 32.0 - 36.0 g/dL   RDW 15.0 (H) 11.5 - 14.5 %   Platelets 156 150 - 440 K/uL    Comment: Performed at [facility], [address] [place], [place] [address]  Troponin I     Status: None   Collection Time: 07/18/18  1:11 PM  Result Value Ref Range   Troponin I <0.03 <0.03 ng/mL    Comment: Performed at [facility], [address] [place], [place] [address]   Objective  There is no height or weight on file to calculate BMI. Wt Readings from Last 3 Encounters:  07/19/18 227 lb (103 kg)  07/18/18 227 lb (103 kg)  07/06/18 229 lb (103.9 kg)   Temp Readings from Last 3 Encounters:  07/19/18 98 F (36.7 C) (Oral)  07/18/18 98.5 F (36.9 C) (Oral)  07/06/18 97.8 F (36.6 C)   BP Readings from Last 3 Encounters:  07/19/18 122/64  07/18/18 111/60  07/06/18 132/82   Pulse Readings from Last 3 Encounters:  07/19/18 88  07/18/18 70  07/06/18 87    Physical Exam  Constitutional: She is oriented to person, place, and time. Vital signs are normal. She appears well-developed and well-nourished. She is cooperative.  HENT:  Head: Normocephalic and atraumatic.  Mouth/Throat: Oropharynx is clear and moist and mucous membranes are normal.  Eyes: Pupils are equal, round, and reactive to light. Conjunctivae are normal.  Cardiovascular: Normal rate, regular rhythm and normal heart sounds.  Pulmonary/Chest: Effort normal and breath sounds normal.  Neurological: She is alert and oriented to person, place, and time. Gait normal.  BL walks with cane    Skin: Skin is warm, dry and intact.  Lower leg hyperpigmented  rash chronic    Psychiatric: She has a normal mood and affect. Her speech is normal and behavior is normal. Judgment and thought content normal. Cognition and memory are normal.  Nursing note and vitals reviewed.   Assessment   1. Hypothyroidism  2. Autoimmune d/o, chronic pain, fibromyalgia  3. Chest pain likely noncardiac cardiac angiogram neg and ppi bid is helping h/o small hiatal hernia. Anxiety uncontrolled  4. HM Plan   1. Refilled levo 25 mcg  Labs at f/u  2. Refer [facility] rheumatology Dr. Tomasa Blase declined and rec higher level of care  3. F/u cards 12/2018 Dr. Josefa Half F/u psych anxiety  Omeprazole bid 40 mg f/u [facility]  4.  Had flu shot had  Had pna 23, prevnar, Tdap  Think about shingrix vaccine disc today again on backorder  Immune MMR and hep B  See other HM 11/03/17  Colonoscopy 2015 h/o polys DEXA 06/29/17 osteopenia Mammogram 06/11/17 neg and 06/15/18 mammo neg  Pap 07/01/18 Dr. Marcelline Mates neg pap neg HPV   Of note CT 09/2017 with ground glass appearance will need repeat in 11/2019never smoker   Provider: Dr. Olivia Mackie McLean-Scocuzza-Internal Medicine

## 2018-12-25 ENCOUNTER — Other Ambulatory Visit: Payer: Self-pay

## 2018-12-25 DIAGNOSIS — R42 Dizziness and giddiness: Secondary | ICD-10-CM | POA: Insufficient documentation

## 2018-12-25 DIAGNOSIS — Z79899 Other long term (current) drug therapy: Secondary | ICD-10-CM | POA: Diagnosis not present

## 2018-12-25 DIAGNOSIS — Z96641 Presence of right artificial hip joint: Secondary | ICD-10-CM | POA: Diagnosis not present

## 2018-12-25 DIAGNOSIS — E039 Hypothyroidism, unspecified: Secondary | ICD-10-CM | POA: Diagnosis not present

## 2018-12-25 DIAGNOSIS — Z91040 Latex allergy status: Secondary | ICD-10-CM | POA: Diagnosis not present

## 2018-12-25 DIAGNOSIS — E119 Type 2 diabetes mellitus without complications: Secondary | ICD-10-CM | POA: Insufficient documentation

## 2018-12-25 DIAGNOSIS — Z8541 Personal history of malignant neoplasm of cervix uteri: Secondary | ICD-10-CM | POA: Insufficient documentation

## 2018-12-25 DIAGNOSIS — J45909 Unspecified asthma, uncomplicated: Secondary | ICD-10-CM | POA: Insufficient documentation

## 2018-12-25 LAB — CBC
HEMATOCRIT: 38.4 % (ref 36.0–46.0)
HEMOGLOBIN: 11.9 g/dL — AB (ref 12.0–15.0)
MCH: 29.3 pg (ref 26.0–34.0)
MCHC: 31 g/dL (ref 30.0–36.0)
MCV: 94.6 fL (ref 80.0–100.0)
Platelets: 137 10*3/uL — ABNORMAL LOW (ref 150–400)
RBC: 4.06 MIL/uL (ref 3.87–5.11)
RDW: 13.2 % (ref 11.5–15.5)
WBC: 4.6 10*3/uL (ref 4.0–10.5)
nRBC: 0 % (ref 0.0–0.2)

## 2018-12-25 LAB — BASIC METABOLIC PANEL
ANION GAP: 8 (ref 5–15)
BUN: 18 mg/dL (ref 6–20)
CHLORIDE: 108 mmol/L (ref 98–111)
CO2: 25 mmol/L (ref 22–32)
Calcium: 8.9 mg/dL (ref 8.9–10.3)
Creatinine, Ser: 0.9 mg/dL (ref 0.44–1.00)
GFR calc Af Amer: >60 mL/min (ref >60)
GFR calc non Af Amer: >60 mL/min (ref >60)
Glucose, Bld: 163 mg/dL — ABNORMAL HIGH (ref 70–99)
POTASSIUM: 3.7 mmol/L (ref 3.5–5.1)
Sodium: 141 mmol/L (ref 135–145)

## 2018-12-25 NOTE — ED Triage Notes (Signed)
Reports dizziness with nausea all day.

## 2018-12-26 ENCOUNTER — Emergency Department
Admission: EM | Admit: 2018-12-26 | Discharge: 2018-12-26 | Disposition: A | Discharge status: Home / Self Care | Payer: Medicare Other | Attending: Emergency Medicine | Admitting: Emergency Medicine

## 2018-12-26 ENCOUNTER — Emergency Department: Payer: Medicare Other

## 2018-12-26 DIAGNOSIS — R42 Dizziness and giddiness: Secondary | ICD-10-CM

## 2018-12-26 LAB — URINALYSIS, COMPLETE (UACMP) WITH MICROSCOPIC
BACTERIA UA: NONE SEEN
BILIRUBIN URINE: NEGATIVE
Glucose, UA: NEGATIVE mg/dL
HGB URINE DIPSTICK: NEGATIVE
Ketones, ur: NEGATIVE mg/dL
LEUKOCYTES UA: NEGATIVE
NITRITE: NEGATIVE
PROTEIN: NEGATIVE mg/dL
Specific Gravity, Urine: 1.008 (ref 1.005–1.030)
Squamous Epithelial / LPF: NONE SEEN (ref 0–5)
pH: 7 (ref 5.0–8.0)

## 2018-12-26 LAB — TROPONIN I

## 2018-12-26 MED ORDER — MECLIZINE HCL 25 MG PO TABS
25.0000 mg | ORAL_TABLET | Freq: Once | ORAL | Status: AC
  Administered 2018-12-26: 25 mg via ORAL
  Filled 2018-12-26: qty 1

## 2018-12-26 MED ORDER — ONDANSETRON HCL 4 MG/2ML IJ SOLN
4.0000 mg | Freq: Once | INTRAMUSCULAR | Status: AC
  Administered 2018-12-26: 4 mg via INTRAVENOUS
  Filled 2018-12-26: qty 2

## 2018-12-26 MED ORDER — SODIUM CHLORIDE 0.9 % IV BOLUS
500.0000 mL | Freq: Once | INTRAVENOUS | Status: AC
  Administered 2018-12-26: 500 mL via INTRAVENOUS

## 2018-12-26 MED ORDER — MECLIZINE HCL 25 MG PO TABS: 25.0000 mg | ORAL_TABLET | Freq: Three times a day (TID) | ORAL | 0 refills | Status: DC | PRN

## 2018-12-26 NOTE — ED Notes (Signed)
Attempt iv insertion x1 without success.

## 2018-12-26 NOTE — ED Notes (Signed)
Report to kala, rn.  

## 2018-12-26 NOTE — ED Notes (Signed)
Assessment: pt with a history of lupus. Pt states she has been intermittently dizzy. Pt states she feels most dizzy when she is standing up and currently when sitting up. Pt states when dizziness comes on she becomes suddenly nauseated and "breaks out in a sweat". Pt denies known fever, denies ringing in ears or a full sensation in ears.

## 2018-12-26 NOTE — ED Provider Notes (Signed)
[facility] Emergency Department Provider Note   ____________________________________________   First MD Initiated Contact with Patient 12/26/18 0023     (approximate)  I have reviewed the triage vital signs and the nursing notes.   HISTORY  Chief Complaint Dizziness    HPI [name] [name] is a 53 y.o. female who presents to the ED from home with a chief complaint of dizziness.  Patient reports dizziness since yesterday morning.  Reports spinning type sensation as well as feeling lightheaded.  Symptoms associated with nausea only.  Denies associated fever, chills, headache, vision changes, neck pain, chest pain, shortness of breath, abdominal pain, vomiting, dysuria, diarrhea.  Denies recent travel or trauma.    Past Medical History:  Diagnosis Date  . ADHD   . Anemia   . ANXIETY 06/06/2007  . Arthritis    related to lupus  . Asthma   . BACK PAIN 02/22/2009  . Cervical cancer ([place])    LEEP 2005   . Chicken pox   . Chronic pain    goes to pain management provider  . Collagen vascular disease ([place])   . DEPRESSION 06/06/2007  . Essential tremor   . Fibromyalgia   . Fibromyalgia   . FREQUENCY, URINARY 07/07/2007  . Gastritis   . GERD 06/06/2007  . Glaucoma   . Headache    h/o migraines   . History of hiatal hernia   . History of kidney stones   . HYPOTHYROIDISM 06/06/2007  . INTERSTITIAL CYSTITIS 09/11/2010  . Lupus ([place]) 2006  . Lupus ([place])   . Menopause   . Migraine   . Mitral valve regurgitation   . NEPHROLITHIASIS, HX OF 11/05/2010  . Neuropathy   . Neuropathy   . OCD (obsessive compulsive disorder)   . Optic neuritis    2005  . OSTEOPENIA 10/14/2009  . Osteoporosis   . OVERACTIVE BLADDER 10/14/2009  . PAIN, CHRONIC NEC 06/06/2007  . Panniculitis   . POLYARTHRITIS 08/19/2007  . Raynaud's disease   . Sicca syndrome ([place])   . 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 Vaginal prolapse    Dr. Marcelline Mates [place]   . Vasculitis ([place])     . WEIGHT GAIN 02/22/2009    Patient Active Problem List   Diagnosis Date Noted  . Hiatal hernia 10/06/2018  . ADHD 08/04/2018  . Lupus arthritis ([place]) 08/04/2018  . Asthma 08/04/2018  . Systemic lupus erythematosus ([place]) 08/04/2018  . Essential tremor 08/04/2018  . Urinary incontinence 08/04/2018  . Glaucoma 08/04/2018  . Migraines 08/04/2018  . Mitral valve regurgitation 08/04/2018  . Neuropathy 08/04/2018  . OCD (obsessive compulsive disorder) 08/04/2018  . Osteopenia 08/04/2018  . Thrombocytopenia ([place]) 07/06/2018  . Chest pain 07/04/2018  . Allergic rhinitis 12/16/2017  . Dizziness 12/16/2017  . Orthostatic hypotension 12/16/2017  . Generalized abdominal pain 12/16/2017  . Constipation 12/16/2017  . Acute nonintractable headache 12/13/2017  . Fibromyalgia 11/04/2017  . Status post hardware removal 02/16/2017  . Sicca syndrome ([place]) 06/04/2014  . Abdominal pain, epigastric 09/08/2013  . Type II or unspecified type diabetes mellitus without mention of complication, uncontrolled 03/15/2013  . UTI (urinary tract infection) 02/15/2013  . Chronic pain 03/31/2011  . NEPHROLITHIASIS, HX OF 11/05/2010  . INTERSTITIAL CYSTITIS 09/11/2010  . Overactive bladder 10/14/2009  . Disorder of bone and cartilage 10/14/2009  . Backache 02/22/2009  . WEIGHT GAIN 02/22/2009  . 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 Polyarthropathy or polyarthritis of multiple sites 08/19/2007  . FREQUENCY, URINARY 07/07/2007  .  Hypothyroidism 06/06/2007  . Anxiety state 06/06/2007  . Depression, recurrent ([place]) 06/06/2007  . PAIN, CHRONIC NEC 06/06/2007  . GERD 06/06/2007    Past Surgical History:  Procedure Laterality Date  . APPENDECTOMY    . CERVICAL BIOPSY  W/ LOOP ELECTRODE EXCISION  2005  . CHOLECYSTECTOMY    . ESOPHAGOGASTRODUODENOSCOPY (EGD) WITH PROPOFOL N/A 08/27/2017   Procedure: ESOPHAGOGASTRODUODENOSCOPY (EGD) WITH PROPOFOL;  Surgeon: Manya Silvas, MD;  Location: [facility]  ENDOSCOPY;  Service: Endoscopy;  Laterality: N/A;  . HAND SURGERY     repair of left metatarsal   . HARDWARE REMOVAL Right 02/16/2017   Procedure: HARDWARE REMOVAL FROM HIP;  Surgeon: Hessie Knows, MD;  Location: [facility] ORS;  Service: Orthopedics;  Laterality: Right;  . JOINT REPLACEMENT    . OTHER SURGICAL HISTORY     left 3rd metatarsal fracture repair 2011   . REVISION TOTAL HIP ARTHROPLASTY  06/2009   replacement 2010 then screw and plate removal 0272; right hip  . TONSILLECTOMY    . TONSILLECTOMY    . wrist  ligament repair bilateral    . WRIST RECONSTRUCTION    . WRIST SURGERY     laceration of right wrist ligaments  . wrist surgery     laceration of left wrist surgery     Prior to Admission medications   Medication Sig Start Date End Date Taking? Authorizing Provider  albuterol (VENTOLIN HFA) 108 (90 Base) MCG/ACT inhaler Inhale 1-2 puffs into the lungs every 4 (four) hours as needed for wheezing or shortness of breath. 10/06/18   McLean-Scocuzza, Nino Glow, MD  ARIPiprazole (ABILIFY) 20 MG tablet Take 20 mg by mouth daily.    [provider]  Ascorbic Acid (VITAMIN C) 500 MG CHEW Chew 2,500 mg by mouth daily.    [provider]  aspirin 325 MG tablet Take 1 tablet (325 mg total) by mouth daily. 02/18/17   Duanne Guess, PA-C  Biotin 10 MG CAPS Take by mouth.    [provider]  Biotin 5000 MCG SUBL Place 10,000 mcg under the tongue daily.    [provider]  budesonide-formoterol (SYMBICORT) 160-4.5 MCG/ACT inhaler Symbicort 160 mcg-4.5 mcg/actuation HFA aerosol inhaler    [provider]  butalbital-acetaminophen-caffeine (FIORICET, ESGIC) 50-325-40 MG tablet Take 1-2 tablets by mouth every 6 (six) hours as needed for headache. 02/25/18 02/25/19  Harvest Dark, MD  calcipotriene (DOVONOX) 0.005 % ointment calcipotriene 0.005 % topical ointment  APPLY A THIN LAYER TO THE AFFECTED AREA(S) BY TOPICAL ROUTE ONCE DAILY ; RUB IN GENTLY AND  COMPLETELY    [provider]  Cholecalciferol (VITAMIN D3) 2000 units capsule Vitamin D3 2,000 unit capsule    [provider]  dapsone 25 MG tablet Take by mouth daily.     [provider]  dexmethylphenidate (FOCALIN XR) 20 MG 24 hr capsule dexmethylphenidate ER 20 mg capsule,extended release biphasic50-50    [provider]  diclofenac sodium (VOLTAREN) 1 % GEL Apply 2 g topically 4 (four) times daily as needed (for knee & finger pain.).     [provider]  dicyclomine (BENTYL) 10 MG capsule dicyclomine 10 mg capsule  every 8hours as needed    [provider]  DULoxetine (CYMBALTA) 60 MG capsule Take 60 mg by mouth 2 (two) times daily.    [provider]  etodolac (LODINE) 500 MG tablet etodolac 500 mg tablet    [provider]  fentaNYL (DURAGESIC - DOSED MCG/HR) 75 MCG/HR Place 75  mcg onto the skin every 3 (three) days.    [provider]  fluocinonide ointment (LIDEX) 0.05 % fluocinonide 0.05 % topical ointment  APPLY TO THE AFFECTED AREA(S) BY TOPICAL ROUTE 2 TIMES PER DAY    [provider]  folic acid (FOLVITE) 1 MG tablet Take 1 tablet (1 mg total) by mouth daily. 07/06/18   McLean-Scocuzza, Nino Glow, MD  hydrocortisone (PROCTOZONE-HC) 2.5 % rectal cream Proctozone-HC 2.5 % topical cream perineal applicator    [provider]  hydroxychloroquine (PLAQUENIL) 200 MG tablet Plaquenil 200 mg tablet  Take 1 tablet twice a day by oral route after meals for 90 days.    [provider]  ibuprofen (ADVIL,MOTRIN) 800 MG tablet Take 1 tablet (800 mg total) by mouth every 8 (eight) hours as needed. 08/04/17   Laban Emperor, PA-C  imiquimod (ALDARA) 5 % cream imiquimod 5 % topical cream packet  APPLY TO THE AFFECTED AREA(S) BY TOPICAL ROUTE 3 TIMES PER WEEK at bedtime    [provider]  isosorbide mononitrate (IMDUR) 60 MG 24 hr tablet Take 1 tablet (60 mg total) by mouth daily. 05/26/18    Carrie Mew, MD  ketorolac (TORADOL) 10 MG tablet Take 1 tablet (10 mg total) every 8 (eight) hours as needed by mouth for moderate pain (with food). 10/11/17   Eula Listen, MD  levothyroxine (SYNTHROID, LEVOTHROID) 25 MCG tablet Take 1 tablet (25 mcg total) by mouth daily before breakfast. 10/06/18   McLean-Scocuzza, Nino Glow, MD  LORazepam (ATIVAN) 2 MG tablet Take 1-2 mg by mouth See admin instructions. 1 MG DAILY AS NEEDED FOR ANXIETY & SCHEDULED AT BEDTIME EACH NIGHT    [provider]  meclizine (ANTIVERT) 25 MG tablet Take 1 tablet (25 mg total) by mouth 3 (three) times daily as needed for dizziness or nausea. 12/26/18   [name], MD  methylphenidate 18 MG PO CR tablet Take by mouth.     [provider]  montelukast (SINGULAIR) 10 MG tablet montelukast 10 mg tablet 1 pill qhs 12/13/17   McLean-Scocuzza, Nino Glow, MD  Multiple Vitamins-Minerals (MULTI-VITAMIN GUMMIES PO) Multi Vitamin  GUMMIES    [provider]  omeprazole (PRILOSEC) 40 MG capsule Take 40 mg by mouth 2 (two) times daily.    [provider]  ondansetron (ZOFRAN) 4 MG tablet Zofran 8 mg tablet  Take 1 tablet every 8 hours by oral route as needed.    [provider]  pantoprazole (PROTONIX) 40 MG tablet Take one tablet by mouth twice daily Patient taking differently: TAKE 1 TABLET (40 MG) BY MOUTH ONCE DAILY IN THE MORNING. 12/18/13   Marletta Lor, MD  PARoxetine (PAXIL) 10 MG tablet Take 1 tablet (10 mg total) by mouth at bedtime. 07/01/18   Rubie Maid, MD  polyethylene glycol powder (GLYCOLAX/MIRALAX) powder Take 17 g by mouth daily. 03/29/18   Harvest Dark, MD  Polyvinyl Alcohol-Povidone (REFRESH OP) Place 1 drop into both eyes 2 (two) times daily.    [provider]  predniSONE (DELTASONE) 10 MG tablet prednisone 10 mg tablet  1 daily    [provider]  Probiotic Product (PROBIOTIC PO) Probiotic 10 billion cell capsule  daily     [provider]  silver sulfADIAZINE (SILVADENE) 1 % cream silver sulfadiazine 1 % topical cream    [provider]  Sodium Chloride 3 % AERS Saline Nasal Mist 3 %  spray nose deeply every hour or two and blow nose  [provider]  sucralfate (CARAFATE) 1 g tablet sucralfate 1 gram tablet    [provider]  topiramate (TOPAMAX) 100 MG tablet topiramate 100 mg tablet  TAKE 1 TABLET BY MOUTH 2 TIMES DAILY.    [provider]  valACYclovir (VALTREX) 500 MG tablet valacyclovir 500 mg tablet  1 daily    [provider]    Allergies Bee venom; Fish allergy; Latex; Prasterone; Shellfish allergy; Shellfish-derived products; Compazine [prochlorperazine edisylate]; Dhea [nutritional supplements]; Dilaudid [hydromorphone hcl]; Lactose; Lactose intolerance (gi); Other; Prochlorperazine; Promethazine; Promethazine hcl; Reclast [zoledronic acid]; Clarithromycin; Hydromorphone hcl; and Penicillins  Family History  Problem Relation Age of Onset  . Hypertension Mother   . Arthritis Mother   . Heart disease Mother        afib  . Asthma Sister   . Asthma Daughter   . Arthritis Daughter   . Heart disease Daughter        ?heart condition on BB  . ADD / ADHD Daughter   . Pancreatic cancer Father   . Cancer Father        pancreatitic   . Diabetes Maternal Grandmother   . Heart disease Maternal Grandmother   . Arthritis Maternal Grandmother   . Hypertension Maternal Grandmother   . Colon cancer Maternal Uncle 19  . Crohn's disease Maternal Aunt   . Diabetes Maternal Aunt   . Breast cancer Cousin 61       maternal  . Cancer Cousin        m cousin breat cancer s/p removal both breasts     Social History Social History   Tobacco Use  . Smoking status: Never Smoker  . Smokeless tobacco: Never Used  Substance Use Topics  . Alcohol use: No  . Drug use: No    Review of Systems  Constitutional: No fever/chills Eyes: No visual  changes. ENT: No sore throat. Cardiovascular: Denies chest pain. Respiratory: Denies shortness of breath. Gastrointestinal: No abdominal pain.  No nausea, no vomiting.  No diarrhea.  No constipation. Genitourinary: Negative for dysuria. Musculoskeletal: Negative for back pain. Skin: Negative for rash. Neurological: Positive for dizziness.  Negative for headaches, focal weakness or numbness.   ____________________________________________   PHYSICAL EXAM:  VITAL SIGNS: ED Triage Vitals  Enc Vitals Group     BP 12/25/18 2115 (!) 130/99     Pulse Rate 12/25/18 2115 96     Resp 12/25/18 2115 18     Temp 12/25/18 2115 98.5 F (36.9 C)     Temp Source 12/25/18 2115 Oral     SpO2 12/25/18 2115 96 %     Weight 12/25/18 2111 230 lb (104.3 kg)     Height 12/25/18 2111 5\' 10"  (1.778 m)     Head Circumference --      Peak Flow --      Pain Score 12/25/18 2111 0     Pain Loc --      Pain Edu? --      Excl. in [place]? --     Constitutional: Alert and oriented. Well appearing and in no acute distress. Eyes: Conjunctivae are normal. PERRL. EOMI. Head: Atraumatic. Nose: No congestion/rhinnorhea. Mouth/Throat: Mucous membranes are moist.  Oropharynx non-erythematous. Neck: No stridor.  No carotid bruits.  Supple neck without meningismus. Cardiovascular: Normal rate, regular rhythm. Grossly normal heart sounds.  Good peripheral circulation. Respiratory: Normal respiratory effort.  No retractions. Lungs CTAB. Gastrointestinal: Soft and nontender. No distention. No abdominal bruits. No CVA tenderness. Musculoskeletal: No  lower extremity tenderness nor edema.  No joint effusions. Neurologic: Alert and oriented x3.  CN II-XII grossly intact.  Normal speech and language. No gross focal neurologic deficits are appreciated. MAEx4.  Essential tremors. Skin:  Skin is warm, dry and intact. No rash noted. Psychiatric: Mood and affect are normal. Speech and behavior are  normal.  ____________________________________________   LABS (all labs ordered are listed, but only abnormal results are displayed)  Labs Reviewed  BASIC METABOLIC PANEL - Abnormal; Notable for the following components:      Result Value   Glucose, Bld 163 (*)    All other components within normal limits  CBC - Abnormal; Notable for the following components:   Hemoglobin 11.9 (*)    Platelets 137 (*)    All other components within normal limits  URINALYSIS, COMPLETE (UACMP) WITH MICROSCOPIC - Abnormal; Notable for the following components:   Color, Urine YELLOW (*)    APPearance CLEAR (*)    All other components within normal limits  TROPONIN I   ____________________________________________  EKG  ED ECG REPORT I, Michalina Calbert J, the attending physician, personally viewed and interpreted this ECG.   Date: 12/26/2018  EKG Time: 2110  Rate: 97  Rhythm: normal EKG, normal sinus rhythm  Axis: Normal  Intervals:none  ST&T Change: Nonspecific  ____________________________________________  RADIOLOGY  ED MD interpretation: No ICH  Official radiology report(s): Ct Head Wo Contrast  Result Date: 12/26/2018 CLINICAL DATA:  Dizziness and nausea EXAM: CT HEAD WITHOUT CONTRAST TECHNIQUE: Contiguous axial images were obtained from the base of the skull through the vertex without intravenous contrast. COMPARISON:  Head 07/18/2018 FINDINGS: Brain: There is no mass, hemorrhage or extra-axial collection. The size and configuration of the ventricles and extra-axial CSF spaces are normal. The brain parenchyma is normal, without acute or chronic infarction. Vascular: No abnormal hyperdensity of the major intracranial arteries or dural venous sinuses. No intracranial atherosclerosis. Skull: The visualized skull base, calvarium and extracranial soft tissues are normal. Sinuses/Orbits: No fluid levels or advanced mucosal thickening of the visualized paranasal sinuses. No mastoid or middle ear  effusion. The orbits are normal. IMPRESSION: Normal brain. Electronically Signed   By: Ulyses Jarred M.D.   On: 12/26/2018 01:01    ____________________________________________   PROCEDURES  Procedure(s) performed: None  Procedures  Critical Care performed: No  ____________________________________________   INITIAL IMPRESSION / ASSESSMENT AND PLAN / ED COURSE  As part of my medical decision making, I reviewed the following data within the [facility] notes reviewed and incorporated, Labs reviewed, EKG interpreted, Old chart reviewed, Radiograph reviewed and Notes from prior ED visits    53 year old female who presents with dizziness.  Differential diagnosis includes but is not limited to CVA, TIA, infectious, metabolic etiologies, etc.  We will check screening lab work including troponin, CT head to evaluate for intracranial hemorrhage.  Obtain orthostatic vital signs, administer meclizine, Zofran and reassess.   Clinical Course as of Dec 26 417  Mon Dec 26, 2018  0258 Patient feeling better.  Updated her on all test results.  Has Zofran at home.  Will prescribe meclizine to use as needed.  Strict return precautions given.  Patient and family member verbalized understanding agree with plan of care.   [JS]    Clinical Course User Index [JS] [name], MD     ____________________________________________   FINAL CLINICAL IMPRESSION(S) / ED DIAGNOSES  Final diagnoses:  Dizziness     ED Discharge Orders  Ordered    meclizine (ANTIVERT) 25 MG tablet  3 times daily PRN     12/26/18 0259           Note:  This document was prepared using Dragon voice recognition software and may include unintentional dictation errors.    [name], MD 12/26/18 [phone]

## 2018-12-26 NOTE — Discharge Instructions (Addendum)
1.  You may take Meclizine as needed for dizziness. 2.  You may take Zofran which you already have at home as needed for nausea. 3.  Return to the ER for worsening symptoms, persistent vomiting, lethargy, difficulty breathing or other concerns.

## 2018-12-30 ENCOUNTER — Other Ambulatory Visit: Payer: Self-pay | Admitting: Internal Medicine

## 2018-12-30 DIAGNOSIS — J309 Allergic rhinitis, unspecified: Secondary | ICD-10-CM

## 2018-12-30 MED ORDER — MONTELUKAST SODIUM 10 MG PO TABS: ORAL_TABLET | ORAL | 3 refills | Status: DC

## 2019-02-09 ENCOUNTER — Emergency Department
Admission: EM | Admit: 2019-02-09 | Discharge: 2019-02-09 | Disposition: A | Discharge status: Home / Self Care | Payer: Medicare Other | Attending: Emergency Medicine | Admitting: Emergency Medicine

## 2019-02-09 ENCOUNTER — Other Ambulatory Visit: Payer: Self-pay | Admitting: Emergency Medicine

## 2019-02-09 ENCOUNTER — Emergency Department: Payer: Medicare Other

## 2019-02-09 ENCOUNTER — Other Ambulatory Visit: Payer: Self-pay

## 2019-02-09 DIAGNOSIS — R079 Chest pain, unspecified: Secondary | ICD-10-CM | POA: Insufficient documentation

## 2019-02-09 DIAGNOSIS — R609 Edema, unspecified: Secondary | ICD-10-CM

## 2019-02-09 DIAGNOSIS — J45909 Unspecified asthma, uncomplicated: Secondary | ICD-10-CM | POA: Diagnosis not present

## 2019-02-09 DIAGNOSIS — E119 Type 2 diabetes mellitus without complications: Secondary | ICD-10-CM | POA: Insufficient documentation

## 2019-02-09 DIAGNOSIS — R600 Localized edema: Secondary | ICD-10-CM | POA: Diagnosis not present

## 2019-02-09 DIAGNOSIS — Z91040 Latex allergy status: Secondary | ICD-10-CM | POA: Diagnosis not present

## 2019-02-09 DIAGNOSIS — M79606 Pain in leg, unspecified: Secondary | ICD-10-CM | POA: Diagnosis present

## 2019-02-09 LAB — URINALYSIS, COMPLETE (UACMP) WITH MICROSCOPIC
BILIRUBIN URINE: NEGATIVE
Bacteria, UA: NONE SEEN
Glucose, UA: NEGATIVE mg/dL
Hgb urine dipstick: NEGATIVE
Ketones, ur: NEGATIVE mg/dL
Nitrite: NEGATIVE
Protein, ur: NEGATIVE mg/dL
Specific Gravity, Urine: 1.015 (ref 1.005–1.030)
pH: 6 (ref 5.0–8.0)

## 2019-02-09 LAB — CBC WITH DIFFERENTIAL/PLATELET
ABS IMMATURE GRANULOCYTES: 0.01 10*3/uL (ref 0.00–0.07)
BASOS ABS: 0 10*3/uL (ref 0.0–0.1)
BASOS PCT: 0 %
EOS ABS: 0.1 10*3/uL (ref 0.0–0.5)
Eosinophils Relative: 4 %
HCT: 37.5 % (ref 36.0–46.0)
Hemoglobin: 12 g/dL (ref 12.0–15.0)
IMMATURE GRANULOCYTES: 0 %
Lymphocytes Relative: 44 %
Lymphs Abs: 1.6 10*3/uL (ref 0.7–4.0)
MCH: 29.3 pg (ref 26.0–34.0)
MCHC: 32 g/dL (ref 30.0–36.0)
MCV: 91.5 fL (ref 80.0–100.0)
Monocytes Absolute: 0.4 10*3/uL (ref 0.1–1.0)
Monocytes Relative: 10 %
NEUTROS ABS: 1.5 10*3/uL — AB (ref 1.7–7.7)
NRBC: 0 % (ref 0.0–0.2)
Neutrophils Relative %: 42 %
PLATELETS: 144 10*3/uL — AB (ref 150–400)
RBC: 4.1 MIL/uL (ref 3.87–5.11)
RDW: 13.4 % (ref 11.5–15.5)
WBC: 3.6 10*3/uL — AB (ref 4.0–10.5)

## 2019-02-09 LAB — COMPREHENSIVE METABOLIC PANEL
ALBUMIN: 4.1 g/dL (ref 3.5–5.0)
ALK PHOS: 69 U/L (ref 38–126)
ALT: 30 U/L (ref 0–44)
ANION GAP: 8 (ref 5–15)
AST: 26 U/L (ref 15–41)
BUN: 13 mg/dL (ref 6–20)
CALCIUM: 8.7 mg/dL — AB (ref 8.9–10.3)
CHLORIDE: 111 mmol/L (ref 98–111)
CO2: 23 mmol/L (ref 22–32)
Creatinine, Ser: 0.9 mg/dL (ref 0.44–1.00)
GFR calc Af Amer: >60 mL/min (ref >60)
GFR calc non Af Amer: >60 mL/min (ref >60)
GLUCOSE: 106 mg/dL — AB (ref 70–99)
Potassium: 3.6 mmol/L (ref 3.5–5.1)
Sodium: 142 mmol/L (ref 135–145)
Total Bilirubin: 0.7 mg/dL (ref 0.3–1.2)
Total Protein: 6.3 g/dL — ABNORMAL LOW (ref 6.5–8.1)

## 2019-02-09 LAB — T4, FREE: Free T4: 0.63 ng/dL — ABNORMAL LOW (ref 0.82–1.77)

## 2019-02-09 LAB — TSH: TSH: 3.824 u[IU]/mL (ref 0.350–4.500)

## 2019-02-09 LAB — TROPONIN I: Troponin I: <0.03 ng/mL (ref <0.03)

## 2019-02-09 LAB — INFLUENZA PANEL BY PCR (TYPE A & B)
Influenza A By PCR: NEGATIVE
Influenza B By PCR: NEGATIVE

## 2019-02-09 LAB — BRAIN NATRIURETIC PEPTIDE: B Natriuretic Peptide: 11 pg/mL (ref 0.0–100.0)

## 2019-02-09 LAB — LIPASE, BLOOD: Lipase: 21 U/L (ref 11–51)

## 2019-02-09 MED ORDER — MEDICAL COMPRESSION STOCKINGS MISC: 0 refills | Status: DC

## 2019-02-09 MED ORDER — ONDANSETRON HCL 4 MG/2ML IJ SOLN: 4.0000 mg | Freq: Once | INTRAMUSCULAR | Status: DC

## 2019-02-09 MED ORDER — ONDANSETRON 4 MG PO TBDP
4.0000 mg | ORAL_TABLET | Freq: Once | ORAL | Status: AC
  Administered 2019-02-09: 4 mg via ORAL
  Filled 2019-02-09: qty 1

## 2019-02-09 NOTE — ED Provider Notes (Signed)
[facility] Emergency Department Provider Note  ____________________________________________  Time seen: Approximately 3:15 PM  I have reviewed the triage vital signs and the nursing notes.   HISTORY  Chief Complaint Bilateral leg swelling   HPI [name] [name] is a 53 y.o. female with a history of collagen vascular disease/lupus, chronically on steroids, who complains of bilateral leg pain and leg swelling since this morning.  Gradual onset, no aggravating or alleviating factors.  No vomiting or diarrhea, eating normally.  No chest pain or shortness of breath.  No fevers chills or cough or body aches      Past Medical History:  Diagnosis Date  . ADHD   . Anemia   . ANXIETY 06/06/2007  . Arthritis    related to lupus  . Asthma   . BACK PAIN 02/22/2009  . Cervical cancer ([place])    LEEP 2005   . Chicken pox   . Chronic pain    goes to pain management provider  . Collagen vascular disease ([place])   . DEPRESSION 06/06/2007  . Essential tremor   . Fibromyalgia   . Fibromyalgia   . FREQUENCY, URINARY 07/07/2007  . Gastritis   . GERD 06/06/2007  . Glaucoma   . Headache    h/o migraines   . History of hiatal hernia   . History of kidney stones   . HYPOTHYROIDISM 06/06/2007  . INTERSTITIAL CYSTITIS 09/11/2010  . Lupus ([place]) 2006  . Lupus ([place])   . Menopause   . Migraine   . Mitral valve regurgitation   . NEPHROLITHIASIS, HX OF 11/05/2010  . Neuropathy   . Neuropathy   . OCD (obsessive compulsive disorder)   . Optic neuritis    2005  . OSTEOPENIA 10/14/2009  . Osteoporosis   . OVERACTIVE BLADDER 10/14/2009  . PAIN, CHRONIC NEC 06/06/2007  . Panniculitis   . POLYARTHRITIS 08/19/2007  . Raynaud's disease   . Sicca syndrome ([place])   . 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 Vaginal prolapse    Dr. Marcelline Mates [place]   . Vasculitis ([place])   . WEIGHT GAIN 02/22/2009     Patient Active Problem List   Diagnosis Date Noted  . Hiatal hernia 10/06/2018   . ADHD 08/04/2018  . Lupus arthritis ([place]) 08/04/2018  . Asthma 08/04/2018  . Systemic lupus erythematosus ([place]) 08/04/2018  . Essential tremor 08/04/2018  . Urinary incontinence 08/04/2018  . Glaucoma 08/04/2018  . Migraines 08/04/2018  . Mitral valve regurgitation 08/04/2018  . Neuropathy 08/04/2018  . OCD (obsessive compulsive disorder) 08/04/2018  . Osteopenia 08/04/2018  . Thrombocytopenia ([place]) 07/06/2018  . Chest pain 07/04/2018  . Allergic rhinitis 12/16/2017  . Dizziness 12/16/2017  . Orthostatic hypotension 12/16/2017  . Generalized abdominal pain 12/16/2017  . Constipation 12/16/2017  . Acute nonintractable headache 12/13/2017  . Fibromyalgia 11/04/2017  . Status post hardware removal 02/16/2017  . Sicca syndrome ([place]) 06/04/2014  . Abdominal pain, epigastric 09/08/2013  . Type II or unspecified type diabetes mellitus without mention of complication, uncontrolled 03/15/2013  . UTI (urinary tract infection) 02/15/2013  . Chronic pain 03/31/2011  . NEPHROLITHIASIS, HX OF 11/05/2010  . INTERSTITIAL CYSTITIS 09/11/2010  . Overactive bladder 10/14/2009  . Disorder of bone and cartilage 10/14/2009  . Backache 02/22/2009  . WEIGHT GAIN 02/22/2009  . 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 Polyarthropathy or polyarthritis of multiple sites 08/19/2007  . FREQUENCY, URINARY 07/07/2007  . Hypothyroidism 06/06/2007  . Anxiety state 06/06/2007  . Depression, recurrent ([place]) 06/06/2007  .  PAIN, CHRONIC NEC 06/06/2007  . GERD 06/06/2007     Past Surgical History:  Procedure Laterality Date  . APPENDECTOMY    . CERVICAL BIOPSY  W/ LOOP ELECTRODE EXCISION  2005  . CHOLECYSTECTOMY    . ESOPHAGOGASTRODUODENOSCOPY (EGD) WITH PROPOFOL N/A 08/27/2017   Procedure: ESOPHAGOGASTRODUODENOSCOPY (EGD) WITH PROPOFOL;  Surgeon: Manya Silvas, MD;  Location: [facility] ENDOSCOPY;  Service: Endoscopy;  Laterality: N/A;  . HAND SURGERY     repair of left metatarsal   . HARDWARE REMOVAL  Right 02/16/2017   Procedure: HARDWARE REMOVAL FROM HIP;  Surgeon: Hessie Knows, MD;  Location: [facility] ORS;  Service: Orthopedics;  Laterality: Right;  . JOINT REPLACEMENT    . OTHER SURGICAL HISTORY     left 3rd metatarsal fracture repair 2011   . REVISION TOTAL HIP ARTHROPLASTY  06/2009   replacement 2010 then screw and plate removal 8101; right hip  . TONSILLECTOMY    . TONSILLECTOMY    . wrist  ligament repair bilateral    . WRIST RECONSTRUCTION    . WRIST SURGERY     laceration of right wrist ligaments  . wrist surgery     laceration of left wrist surgery      Prior to Admission medications   Medication Sig Start Date End Date Taking? Authorizing Provider  albuterol (VENTOLIN HFA) 108 (90 Base) MCG/ACT inhaler Inhale 1-2 puffs into the lungs every 4 (four) hours as needed for wheezing or shortness of breath. 10/06/18   McLean-Scocuzza, Nino Glow, MD  ARIPiprazole (ABILIFY) 20 MG tablet Take 20 mg by mouth daily.    [provider]  Ascorbic Acid (VITAMIN C) 500 MG CHEW Chew 2,500 mg by mouth daily.    [provider]  aspirin 325 MG tablet Take 1 tablet (325 mg total) by mouth daily. 02/18/17   Duanne Guess, PA-C  Biotin 10 MG CAPS Take by mouth.    [provider]  Biotin 5000 MCG SUBL Place 10,000 mcg under the tongue daily.    [provider]  budesonide-formoterol (SYMBICORT) 160-4.5 MCG/ACT inhaler Symbicort 160 mcg-4.5 mcg/actuation HFA aerosol inhaler    [provider]  butalbital-acetaminophen-caffeine (FIORICET, ESGIC) 50-325-40 MG tablet Take 1-2 tablets by mouth every 6 (six) hours as needed for headache. 02/25/18 02/25/19  Harvest Dark, MD  calcipotriene (DOVONOX) 0.005 % ointment calcipotriene 0.005 % topical ointment  APPLY A THIN LAYER TO THE AFFECTED AREA(S) BY TOPICAL ROUTE ONCE DAILY ; RUB IN GENTLY AND COMPLETELY    [provider]  Cholecalciferol (VITAMIN D3) 2000 units capsule Vitamin D3 2,000 unit  capsule    [provider]  dapsone 25 MG tablet Take by mouth daily.     [provider]  dexmethylphenidate (FOCALIN XR) 20 MG 24 hr capsule dexmethylphenidate ER 20 mg capsule,extended release biphasic50-50    [provider]  diclofenac sodium (VOLTAREN) 1 % GEL Apply 2 g topically 4 (four) times daily as needed (for knee & finger pain.).     [provider]  dicyclomine (BENTYL) 10 MG capsule dicyclomine 10 mg capsule  every 8hours as needed    [provider]  DULoxetine (CYMBALTA) 60 MG capsule Take 60 mg by mouth 2 (two) times daily.    [provider]  etodolac (LODINE) 500 MG tablet etodolac 500 mg tablet    [provider]  fentaNYL (DURAGESIC - DOSED MCG/HR) 75 MCG/HR Place 75 mcg onto the skin every 3 (three) days.    [provider]  fluocinonide ointment (LIDEX) 0.05 % fluocinonide 0.05 % topical ointment  APPLY TO THE AFFECTED AREA(S) BY TOPICAL ROUTE 2 TIMES PER DAY    [provider]  folic acid (FOLVITE) 1 MG tablet Take 1 tablet (1 mg total) by mouth daily. 07/06/18   McLean-Scocuzza, Nino Glow, MD  hydrocortisone (PROCTOZONE-HC) 2.5 % rectal cream Proctozone-HC 2.5 % topical cream perineal applicator    [provider]  hydroxychloroquine (PLAQUENIL) 200 MG tablet Plaquenil 200 mg tablet  Take 1 tablet twice a day by oral route after meals for 90 days.    [provider]  ibuprofen (ADVIL,MOTRIN) 800 MG tablet Take 1 tablet (800 mg total) by mouth every 8 (eight) hours as needed. 08/04/17   Laban Emperor, PA-C  imiquimod (ALDARA) 5 % cream imiquimod 5 % topical cream packet  APPLY TO THE AFFECTED AREA(S) BY TOPICAL ROUTE 3 TIMES PER WEEK at bedtime    [provider]  isosorbide mononitrate (IMDUR) 60 MG 24 hr tablet Take 1 tablet (60 mg total) by mouth daily. 05/26/18   Carrie Mew, MD  ketorolac (TORADOL) 10 MG tablet Take 1 tablet (10 mg total) every 8 (eight) hours  as needed by mouth for moderate pain (with food). 10/11/17   Eula Listen, MD  levothyroxine (SYNTHROID, LEVOTHROID) 25 MCG tablet Take 1 tablet (25 mcg total) by mouth daily before breakfast. 10/06/18   McLean-Scocuzza, Nino Glow, MD  LORazepam (ATIVAN) 2 MG tablet Take 1-2 mg by mouth See admin instructions. 1 MG DAILY AS NEEDED FOR ANXIETY & SCHEDULED AT BEDTIME EACH NIGHT    [provider]  meclizine (ANTIVERT) 25 MG tablet Take 1 tablet (25 mg total) by mouth 3 (three) times daily as needed for dizziness or nausea. 12/26/18   [name], MD  methylphenidate 18 MG PO CR tablet Take by mouth.     [provider]  montelukast (SINGULAIR) 10 MG tablet montelukast 10 mg tablet 1 pill qhs 12/30/18   McLean-Scocuzza, Nino Glow, MD  Multiple Vitamins-Minerals (MULTI-VITAMIN GUMMIES PO) Multi Vitamin  GUMMIES    [provider]  omeprazole (PRILOSEC) 40 MG capsule Take 40 mg by mouth 2 (two) times daily.    [provider]  ondansetron (ZOFRAN) 4 MG tablet Zofran 8 mg tablet  Take 1 tablet every 8 hours by oral route as needed.    [provider]  pantoprazole (PROTONIX) 40 MG tablet Take one tablet by mouth twice daily Patient taking differently: TAKE 1 TABLET (40 MG) BY MOUTH ONCE DAILY IN THE MORNING. 12/18/13   Marletta Lor, MD  PARoxetine (PAXIL) 10 MG tablet Take 1 tablet (10 mg total) by mouth at bedtime. 07/01/18   Rubie Maid, MD  polyethylene glycol powder (GLYCOLAX/MIRALAX) powder Take 17 g by mouth daily. 03/29/18   Harvest Dark, MD  Polyvinyl Alcohol-Povidone (REFRESH OP) Place 1 drop into both eyes 2 (two) times daily.    [provider]  predniSONE (DELTASONE) 10 MG tablet prednisone 10 mg tablet  1 daily    [provider]  Probiotic Product (PROBIOTIC PO) Probiotic 10 billion cell capsule  daily    [provider]  silver sulfADIAZINE (SILVADENE) 1 % cream silver sulfadiazine 1 % topical cream     [provider]  Sodium Chloride 3 % AERS Saline Nasal Mist 3 %  spray nose deeply every hour or two and blow nose    [provider]  sucralfate (CARAFATE) 1 g tablet sucralfate 1  gram tablet    [provider]  topiramate (TOPAMAX) 100 MG tablet topiramate 100 mg tablet  TAKE 1 TABLET BY MOUTH 2 TIMES DAILY.    [provider]  valACYclovir (VALTREX) 500 MG tablet valacyclovir 500 mg tablet  1 daily    [provider]     Allergies Bee venom; Fish allergy; Latex; Prasterone; Shellfish allergy; Shellfish-derived products; Compazine [prochlorperazine edisylate]; Dhea [nutritional supplements]; Dilaudid [hydromorphone hcl]; Lactose; Lactose intolerance (gi); Other; Prochlorperazine; Promethazine; Promethazine hcl; Reclast [zoledronic acid]; Clarithromycin; Hydromorphone hcl; and Penicillins   Family History  Problem Relation Age of Onset  . Hypertension Mother   . Arthritis Mother   . Heart disease Mother        afib  . Asthma Sister   . Asthma Daughter   . Arthritis Daughter   . Heart disease Daughter        ?heart condition on BB  . ADD / ADHD Daughter   . Pancreatic cancer Father   . Cancer Father        pancreatitic   . Diabetes Maternal Grandmother   . Heart disease Maternal Grandmother   . Arthritis Maternal Grandmother   . Hypertension Maternal Grandmother   . Colon cancer Maternal Uncle 77  . Crohn's disease Maternal Aunt   . Diabetes Maternal Aunt   . Breast cancer Cousin 15       maternal  . Cancer Cousin        m cousin breat cancer s/p removal both breasts     Social History Social History   Tobacco Use  . Smoking status: Never Smoker  . Smokeless tobacco: Never Used  Substance Use Topics  . Alcohol use: No  . Drug use: No    Review of Systems  Constitutional:   No fever or chills.  ENT:   No sore throat. No rhinorrhea. Cardiovascular:   No chest pain or syncope. Respiratory:   No dyspnea or  cough. Gastrointestinal:   Negative for abdominal pain, vomiting and diarrhea.  Musculoskeletal:   Bilateral leg pain and swelling All other systems reviewed and are negative except as documented above in ROS and HPI.  ____________________________________________   PHYSICAL EXAM:  VITAL SIGNS: ED Triage Vitals [02/09/19 1300]  Enc Vitals Group     BP 110/68     Pulse Rate 73     Resp 18     Temp      Temp src      SpO2 95 %     Weight      Height      Head Circumference      Peak Flow      Pain Score      Pain Loc      Pain Edu?      Excl. in [place]?     Vital signs reviewed, nursing assessments reviewed.   Constitutional:   Alert and oriented. Non-toxic appearance. Eyes:   Conjunctivae are normal. EOMI. PERRL. ENT      Head:   Normocephalic and atraumatic.      Nose:   No congestion/rhinnorhea.       Mouth/Throat:   MMM, no pharyngeal erythema. No peritonsillar mass.       Neck:   No meningismus. Full ROM.  Thyroid nonpalpable Hematological/Lymphatic/Immunilogical:   No cervical lymphadenopathy. Cardiovascular:   RRR. Symmetric bilateral radial and DP pulses.  No murmurs. Cap refill less than 2 seconds. Respiratory:   Normal respiratory effort without tachypnea/retractions. Breath sounds are  clear and equal bilaterally. No wheezes/rales/rhonchi. Gastrointestinal:   Soft and nontender. Non distended. There is no CVA tenderness.  No rebound, rigidity, or guarding. Genitourinary:   deferred Musculoskeletal:   Normal range of motion in all extremities. No joint effusions.  No lower extremity tenderness.  1+ pitting edema bilateral lower extremities.  Symmetric calf circumference, negative Homans sign.  Mild bilateral calf tenderness.  No inflammatory changes Neurologic:   Normal speech and language.  Motor grossly intact. No acute focal neurologic deficits are appreciated.  Skin:    Skin is warm, dry and intact. No rash noted.  No petechiae, purpura, or  bullae.  ____________________________________________    LABS (pertinent positives/negatives) (all labs ordered are listed, but only abnormal results are displayed) Labs Reviewed  URINALYSIS, COMPLETE (UACMP) WITH MICROSCOPIC - Abnormal; Notable for the following components:      Result Value   Color, Urine YELLOW (*)    APPearance CLEAR (*)    Leukocytes,Ua TRACE (*)    All other components within normal limits  T4, FREE - Abnormal; Notable for the following components:   Free T4 0.63 (*)    All other components within normal limits  URINE CULTURE  INFLUENZA PANEL BY PCR (TYPE A & B)  LIPASE, BLOOD  TSH  TROPONIN I  BRAIN NATRIURETIC PEPTIDE   ____________________________________________   EKG  Interpreted by me Normal sinus rhythm rate of 91, normal axis intervals QRS ST segments and T waves  ____________________________________________    RADIOLOGY  Dg Chest Portable 1 View  Result Date: 02/09/2019 CLINICAL DATA:  Patient states intermittent chest pain and bilateral leg swelling, history of lupus, GERD, gastritis EXAM: PORTABLE CHEST 1 VIEW COMPARISON:  07/18/2018 FINDINGS: The heart size and mediastinal contours are within normal limits. Both lungs are clear. The visualized skeletal structures are unremarkable. IMPRESSION: No active disease. Electronically Signed   By: Nolon Nations M.D.   On: 02/09/2019 13:34    ____________________________________________   PROCEDURES Procedures  ____________________________________________  DIFFERENTIAL DIAGNOSIS   Peripheral edema, pleural effusion, pulmonary edema, heart failure, DVT, hypothyroidism, prednisone side effect  CLINICAL IMPRESSION / ASSESSMENT AND PLAN / ED COURSE  Medications ordered in the ED: Medications  ondansetron (ZOFRAN-ODT) disintegrating tablet 4 mg (4 mg Oral Given 02/09/19 1320)    Pertinent labs & imaging results that were available during my care of the patient were reviewed by me and  considered in my medical decision making (see chart for details).    Patient presents with bilateral leg pain and swelling.  Vital signs are unremarkable, exam shows some edema without other acute findings.  Doubt soft tissue infection, no evidence of fracture or trauma.  Chest x-ray and labs are all unremarkable, EKG unremarkable, no evidence of ACS as a cause of the symptoms.  Not with other cardiopulmonary disease.  Awaiting ultrasound bilateral lower extremities to evaluate for DVT, if negative can discharge home and follow-up with primary care.  Case signed out to Dr. Kerman Passey.      ____________________________________________   FINAL CLINICAL IMPRESSION(S) / ED DIAGNOSES    Final diagnoses:  Peripheral edema     ED Discharge Orders    None      Portions of this note were generated with dragon dictation software. Dictation errors may occur despite best attempts at proofreading.   Carrie Mew, MD 02/09/19 1525

## 2019-02-09 NOTE — ED Provider Notes (Signed)
-----------------------------------------   4:51 PM on 02/09/2019 -----------------------------------------  Patient care assumed from Dr. Joni Fears.  Patient's ultrasound has resulted negative for DVT.  Largely negative work-up.  We will discharge with PCP follow-up.   Harvest Dark, MD 02/09/19 1651

## 2019-02-09 NOTE — ED Notes (Signed)
Triage completed during downtime and documented on downtime forms

## 2019-02-12 LAB — URINE CULTURE: Culture: NO GROWTH

## 2019-02-14 ENCOUNTER — Encounter: Payer: Self-pay | Admitting: Internal Medicine

## 2019-02-14 ENCOUNTER — Ambulatory Visit (INDEPENDENT_AMBULATORY_CARE_PROVIDER_SITE_OTHER): Payer: Medicare Other | Admitting: Internal Medicine

## 2019-02-14 ENCOUNTER — Other Ambulatory Visit: Payer: Self-pay

## 2019-02-14 VITALS — BP 128/78 | HR 87 | Temp 98.0°F | Ht 70.0 in | Wt 229.0 lb

## 2019-02-14 DIAGNOSIS — R600 Localized edema: Secondary | ICD-10-CM

## 2019-02-14 DIAGNOSIS — G250 Essential tremor: Secondary | ICD-10-CM | POA: Diagnosis not present

## 2019-02-14 DIAGNOSIS — M359 Systemic involvement of connective tissue, unspecified: Secondary | ICD-10-CM | POA: Insufficient documentation

## 2019-02-14 DIAGNOSIS — G43909 Migraine, unspecified, not intractable, without status migrainosus: Secondary | ICD-10-CM | POA: Diagnosis not present

## 2019-02-14 DIAGNOSIS — R1011 Right upper quadrant pain: Secondary | ICD-10-CM | POA: Diagnosis not present

## 2019-02-14 NOTE — Patient Instructions (Signed)
Component Value Ref Range Performed At Pathologist Signature  Thyroid Stimulating Hormone (TSH) 2.82 0.34 - 5.66 IU/mL [facility]   Thyroxine, Free (FT4) 0.70 0.52 - 1.21 ng/dL [facility]    Thyroid Profile (TSH and T4 Free) (02/13/2019 3:28 PM EDT)  Specimen    Lasix 20 mg in am x 3 days then if not better take 2 pills in am =40 mg daily in am  Call back in 1 week and let me know how doing    Edema  Edema is an abnormal buildup of fluids in the body tissues and under the skin. Swelling of the legs, feet, and ankles is a common symptom that becomes more likely as you get older. Swelling is also common in looser tissues, like around the eyes. When the affected area is squeezed, the fluid may move out of that spot and leave a dent for a few moments. This dent is called pitting edema. There are many possible causes of edema. Eating too much salt (sodium) and being on your feet or sitting for a long time can cause edema in your legs, feet, and ankles. Hot weather may make edema worse. Common causes of edema include:  Heart failure.  Liver or kidney disease.  Weak leg blood vessels.  Cancer.  An injury.  Pregnancy.  Medicines.  Being obese.  Low protein levels in the blood. Edema is usually painless. Your skin may look swollen or shiny. Follow these instructions at home:  Keep the affected body part raised (elevated) above the level of your heart when you are sitting or lying down.  Do not sit still or stand for long periods of time.  Do not wear tight clothing. Do not wear garters on your upper legs.  Exercise your legs to get your circulation going. This helps to move the fluid back into your blood vessels, and it may help the swelling go down.  Wear elastic bandages or support stockings to reduce swelling as told by your health care provider.  Eat a low-salt (low-sodium) diet to reduce fluid as told by your health care  provider.  Depending on the cause of your swelling, you may need to limit how much fluid you drink (fluid restriction).  Take over-the-counter and prescription medicines only as told by your health care provider. Contact a health care provider if:  Your edema does not get better with treatment.  You have heart, liver, or kidney disease and have symptoms of edema.  You have sudden and unexplained weight gain. Get help right away if:  You develop shortness of breath or chest pain.  You cannot breathe when you lie down.  You develop pain, redness, or warmth in the swollen areas.  You have heart, liver, or kidney disease and suddenly get edema.  You have a fever and your symptoms suddenly get worse. Summary  Edema is an abnormal buildup of fluids in the body tissues and under the skin.  Eating too much salt (sodium) and being on your feet or sitting for a long time can cause edema in your legs, feet, and ankles.  Keep the affected body part raised (elevated) above the level of your heart when you are sitting or lying down. This information is not intended to replace advice given to you by your health care provider. Make sure you discuss any questions you have with your health care provider. Document Released: 11/16/2005 Document Revised: 12/19/2016 Document Reviewed: 12/19/2016 Elsevier Interactive Patient Education  2019  [employer].

## 2019-02-14 NOTE — Progress Notes (Signed)
Chief Complaint  Patient presents with  . Follow-up  . Leg Swelling  . Abdominal Pain   ED f/u  1. Leg pain and swelling [place] negative for DVT 02/09/2019 [facility] ED, legs feel tight she was given lasix 20 mg but did not start taking it yet wanted to disc with PCP  2. RUQ abdominal pain s/p GB removal. Nothing tried pain new w/in the last 1-2 weeks. She denies being constipated though she is on chronic narcotics for chronic pain  3. 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 She brings in disability paperwork to be filled out this is every 6 months to yearly this needs to be done will fill out and call pt when ready due date is 02/28/2019  5. Tremor per pt another specialists mentioned to her they were c/w parkinsons will CC Dr. Manuella Ghazi appt 05/09/2019   Review of Systems  Constitutional: Negative for weight loss.  HENT: Negative for hearing loss.   Eyes: Negative for blurred vision.  Respiratory: Negative for shortness of breath.   Cardiovascular: Positive for leg swelling.  Gastrointestinal: Positive for abdominal pain. Negative for nausea and vomiting.  Musculoskeletal:       +chronic pain    Skin: Negative for rash.  Psychiatric/Behavioral: Negative for depression.   Past Medical History:  Diagnosis Date  . ADHD   . Anemia   . ANXIETY 06/06/2007  . Arthritis    related to lupus  . Asthma   . BACK PAIN 02/22/2009  . Cervical cancer ([place])    LEEP 2005   . Chicken pox   . Chronic pain    goes to pain management provider  . Collagen vascular disease ([place])   . DEPRESSION 06/06/2007  . Essential tremor   . Fibromyalgia   . Fibromyalgia   . FREQUENCY, URINARY 07/07/2007  . Gastritis   . GERD 06/06/2007  . Glaucoma   . Headache    h/o migraines   . History of hiatal hernia   . History of kidney stones   . HYPOTHYROIDISM 06/06/2007  . INTERSTITIAL CYSTITIS 09/11/2010  . Lupus ([place]) 2006  . Lupus ([place])   . Menopause   . Migraine   . Mitral valve  regurgitation   . NEPHROLITHIASIS, HX OF 11/05/2010  . Neuropathy   . Neuropathy   . OCD (obsessive compulsive disorder)   . Optic neuritis    2005  . OSTEOPENIA 10/14/2009  . Osteoporosis   . OVERACTIVE BLADDER 10/14/2009  . PAIN, CHRONIC NEC 06/06/2007  . Panniculitis   . POLYARTHRITIS 08/19/2007  . Raynaud's disease   . Sicca syndrome ([place])   . 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 Vaginal prolapse    Dr. Marcelline Mates [facility]   . Vasculitis ([place])   . WEIGHT GAIN 02/22/2009   Past Surgical History:  Procedure Laterality Date  . APPENDECTOMY    . CERVICAL BIOPSY  W/ LOOP ELECTRODE EXCISION  2005  . CHOLECYSTECTOMY    . ESOPHAGOGASTRODUODENOSCOPY (EGD) WITH PROPOFOL N/A 08/27/2017   Procedure: ESOPHAGOGASTRODUODENOSCOPY (EGD) WITH PROPOFOL;  Surgeon: Manya Silvas, MD;  Location: [facility] ENDOSCOPY;  Service: Endoscopy;  Laterality: N/A;  . HAND SURGERY     repair of left metatarsal   . HARDWARE REMOVAL Right 02/16/2017   Procedure: HARDWARE REMOVAL FROM HIP;  Surgeon: Hessie Knows, MD;  Location: [facility] ORS;  Service: Orthopedics;  Laterality: Right;  . JOINT REPLACEMENT    . OTHER SURGICAL HISTORY     left  3rd metatarsal fracture repair 2011   . REVISION TOTAL HIP ARTHROPLASTY  06/2009   replacement 2010 then screw and plate removal 0932; right hip  . TONSILLECTOMY    . TONSILLECTOMY    . wrist  ligament repair bilateral    . WRIST RECONSTRUCTION    . WRIST SURGERY     laceration of right wrist ligaments  . wrist surgery     laceration of left wrist surgery    Family History  Problem Relation Age of Onset  . Hypertension Mother   . Arthritis Mother   . Heart disease Mother        afib  . Asthma Sister   . Asthma Daughter   . Arthritis Daughter   . Heart disease Daughter        ?heart condition on BB  . ADD / ADHD Daughter   . Pancreatic cancer Father   . Cancer Father        pancreatitic   . Diabetes Maternal Grandmother   . Heart disease Maternal Grandmother   .  Arthritis Maternal Grandmother   . Hypertension Maternal Grandmother   . Colon cancer Maternal Uncle 15  . Crohn's disease Maternal Aunt   . Diabetes Maternal Aunt   . Breast cancer Cousin 72       maternal  . Cancer Cousin        m cousin breat cancer s/p removal both breasts    Social History   Socioeconomic History  . Marital status: Divorced    Spouse name: Not on file  . Number of children: 1  . Years of education: Not on file  . Highest education level: Not on file  Occupational History  . Occupation: DISABLED    Employer: UNEMPLOYED  Social Needs  . Financial resource strain: Not on file  . Food insecurity:    Worry: Not on file    Inability: Not on file  . Transportation needs:    Medical: Not on file    Non-medical: Not on file  Tobacco Use  . Smoking status: Never Smoker  . Smokeless tobacco: Never Used  Substance and Sexual Activity  . Alcohol use: No  . Drug use: No  . Sexual activity: Not Currently    Birth control/protection: None, Post-menopausal  Lifestyle  . Physical activity:    Days per week: Not on file    Minutes per session: Not on file  . Stress: Not on file  Relationships  . Social connections:    Talks on phone: Not on file    Gets together: Not on file    Attends religious service: Not on file    Active member of club or organization: Not on file    Attends meetings of clubs or organizations: Not on file    Relationship status: Not on file  . Intimate partner violence:    Fear of current or ex partner: Not on file    Emotionally abused: Not on file    Physically abused: Not on file    Forced sexual activity: Not on file  Other Topics Concern  . 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      Current Meds  Medication Sig  . albuterol (VENTOLIN HFA) 108 (90 Base) MCG/ACT inhaler Inhale 1-2 puffs  into the lungs every 4 (four) hours as  needed for wheezing or shortness of breath.  . ARIPiprazole (ABILIFY) 20 MG tablet Take 20 mg by mouth daily.  . Ascorbic Acid (VITAMIN C) 500 MG CHEW Chew 2,500 mg by mouth daily.  Marland Kitchen aspirin 325 MG tablet Take 1 tablet (325 mg total) by mouth daily.  . Biotin 10 MG CAPS Take by mouth.  . Biotin 5000 MCG SUBL Place 10,000 mcg under the tongue daily.  . budesonide-formoterol (SYMBICORT) 160-4.5 MCG/ACT inhaler Symbicort 160 mcg-4.5 mcg/actuation HFA aerosol inhaler  . butalbital-acetaminophen-caffeine (FIORICET, ESGIC) 50-325-40 MG tablet Take 1-2 tablets by mouth every 6 (six) hours as needed for headache.  . calcipotriene (DOVONOX) 0.005 % ointment calcipotriene 0.005 % topical ointment  APPLY A THIN LAYER TO THE AFFECTED AREA(S) BY TOPICAL ROUTE ONCE DAILY ; RUB IN GENTLY AND COMPLETELY  . Cholecalciferol (VITAMIN D3) 2000 units capsule Vitamin D3 2,000 unit capsule  . dapsone 25 MG tablet Take by mouth daily.   Marland Kitchen dexmethylphenidate (FOCALIN XR) 20 MG 24 hr capsule dexmethylphenidate ER 20 mg capsule,extended release biphasic50-50  . diclofenac sodium (VOLTAREN) 1 % GEL Apply 2 g topically 4 (four) times daily as needed (for knee & finger pain.).   Marland Kitchen dicyclomine (BENTYL) 10 MG capsule dicyclomine 10 mg capsule  every 8hours as needed  . DULoxetine (CYMBALTA) 60 MG capsule Take 60 mg by mouth 2 (two) times daily.  Regino Schultze Bandages & Supports (MEDICAL COMPRESSION STOCKINGS) MISC Please provide 67mHg compression stockings  . etodolac (LODINE) 500 MG tablet etodolac 500 mg tablet  . fentaNYL (DURAGESIC - DOSED MCG/HR) 75 MCG/HR Place 75 mcg onto the skin every 3 (three) days.  . fluocinonide ointment (LIDEX) 0.05 % fluocinonide 0.05 % topical ointment  APPLY TO THE AFFECTED AREA(S) BY TOPICAL ROUTE 2 TIMES PER DAY  . folic acid (FOLVITE) 1 MG tablet Take 1 tablet (1 mg total) by mouth daily.  . hydrocortisone (PROCTOZONE-HC) 2.5 % rectal cream  Proctozone-HC 2.5 % topical cream perineal applicator  . hydroxychloroquine (PLAQUENIL) 200 MG tablet Plaquenil 200 mg tablet  Take 1 tablet twice a day by oral route after meals for 90 days.  .Marland Kitchenibuprofen (ADVIL,MOTRIN) 800 MG tablet Take 1 tablet (800 mg total) by mouth every 8 (eight) hours as needed.  . imiquimod (ALDARA) 5 % cream imiquimod 5 % topical cream packet  APPLY TO THE AFFECTED AREA(S) BY TOPICAL ROUTE 3 TIMES PER WEEK at bedtime  . isosorbide mononitrate (IMDUR) 60 MG 24 hr tablet Take 1 tablet (60 mg total) by mouth daily.  .Marland Kitchenketorolac (TORADOL) 10 MG tablet Take 1 tablet (10 mg total) every 8 (eight) hours as needed by mouth for moderate pain (with food).  .Marland Kitchenlevothyroxine (SYNTHROID, LEVOTHROID) 25 MCG tablet Take 1 tablet (25 mcg total) by mouth daily before breakfast.  . LORazepam (ATIVAN) 2 MG tablet Take 1-2 mg by mouth See admin instructions. 1 MG DAILY AS NEEDED FOR ANXIETY & SCHEDULED AT BEDTIME EACH NIGHT  . meclizine (ANTIVERT) 25 MG tablet Take 1 tablet (25 mg total) by mouth 3 (three) times daily as needed for dizziness or nausea.  . methylphenidate 18 MG PO CR tablet Take by mouth.   . montelukast (SINGULAIR) 10 MG tablet montelukast 10 mg tablet 1 pill qhs  . Multiple Vitamins-Minerals (MULTI-VITAMIN GUMMIES PO) Multi Vitamin  GUMMIES  . omeprazole (PRILOSEC) 40 MG capsule Take 40 mg by mouth 2 (two) times daily.  . ondansetron (ZOFRAN) 4 MG tablet Zofran 8 mg tablet  Take 1 tablet every  8 hours by oral route as needed.  . pantoprazole (PROTONIX) 40 MG tablet Take one tablet by mouth twice daily (Patient taking differently: TAKE 1 TABLET (40 MG) BY MOUTH ONCE DAILY IN THE MORNING.)  . PARoxetine (PAXIL) 10 MG tablet Take 1 tablet (10 mg total) by mouth at bedtime.  . polyethylene glycol powder (GLYCOLAX/MIRALAX) powder Take 17 g by mouth daily.  . Polyvinyl Alcohol-Povidone (REFRESH OP) Place 1 drop into both eyes 2 (two) times daily.  . predniSONE (DELTASONE) 10  MG tablet prednisone 10 mg tablet  1 daily  . Probiotic Product (PROBIOTIC PO) Probiotic 10 billion cell capsule  daily  . silver sulfADIAZINE (SILVADENE) 1 % cream silver sulfadiazine 1 % topical cream  . Sodium Chloride 3 % AERS Saline Nasal Mist 3 %  spray nose deeply every hour or two and blow nose  . sucralfate (CARAFATE) 1 g tablet sucralfate 1 gram tablet  . topiramate (TOPAMAX) 100 MG tablet topiramate 100 mg tablet  TAKE 1 TABLET BY MOUTH 2 TIMES DAILY.  . valACYclovir (VALTREX) 500 MG tablet valacyclovir 500 mg tablet  1 daily   Allergies  Allergen Reactions  . Bee Venom Itching and Swelling    Affected area Affected area Affected area Affected area   . Fish Allergy Hives, Swelling and Rash    Facial swelling  Facial swelling  Facial swelling Facial swelling  Facial swelling   . Latex Anaphylaxis, Rash and Shortness Of Breath    Rash  Rash    . Prasterone Other (See Comments) and Nausea And Vomiting    rash Other reaction(s): Headache Headaches.  . Shellfish Allergy Hives, Other (See Comments), Rash and Swelling    Facial swelling Uncoded Allergy. Allergen: seafood Uncoded Allergy. Allergen: CATS, Other Reaction: itch, wheezing Uncoded Allergy. Allergen: COMPAZINE, Other Reaction: tremors Facial swelling Facial swelling Uncoded Allergy. Allergen: seafood Uncoded Allergy. Allergen: CATS, Other Reaction: itch, wheezing Uncoded Allergy. Allergen: COMPAZINE, Other Reaction: tremors Facial swelling Uncoded Allergy. Allergen: seafood Uncoded Allergy. Allergen: CATS, Other Reaction: itch, wheezing Uncoded Allergy. Allergen: COMPAZINE, Other Reaction: tremors Facial swelling Facial swelling Uncoded Allergy. Allergen: seafood Uncoded Allergy. Allergen: CATS, Other Reaction: itch, wheezing Uncoded Allergy. Allergen: COMPAZINE, Other Reaction: tremors Facial swelling Facial swelling Uncoded Allergy. Allergen: seafood Uncoded Allergy. Allergen: CATS, Other  Reaction: itch, wheezing Uncoded Allergy. Allergen: COMPAZINE, Other Reaction: tremors   . Shellfish-Derived Products Hives, Other (See Comments), Rash and Swelling    Facial swelling Uncoded Allergy. Allergen: seafood Uncoded Allergy. Allergen: CATS, Other Reaction: itch, wheezing Uncoded Allergy. Allergen: COMPAZINE, Other Reaction: tremors Facial swelling Facial swelling Uncoded Allergy. Allergen: seafood Uncoded Allergy. Allergen: CATS, Other Reaction: itch, wheezing Uncoded Allergy. Allergen: COMPAZINE, Other Reaction: tremors Facial swelling Uncoded Allergy. Allergen: seafood Uncoded Allergy. Allergen: CATS, Other Reaction: itch, wheezing Uncoded Allergy. Allergen: COMPAZINE, Other Reaction: tremors Facial swelling  . Compazine [Prochlorperazine Edisylate] Other (See Comments)    tremors  . Dhea [Nutritional Supplements] Other (See Comments)    Headaches.  . Dilaudid [Hydromorphone Hcl]     ? reaction  . Lactose   . Lactose Intolerance (Gi)   . Other Other (See Comments)    Other reaction(s): Unknown   . Prochlorperazine     Other reaction(s): Other (See Comments) ticks  . Promethazine     Other reaction(s): Other (See Comments) Ticks  . Promethazine Hcl Other (See Comments)    CNS disorder  . Reclast [Zoledronic Acid]     Weakness could not move limbs, fatigue, increase sleep  .  Clarithromycin Hives and Rash  . Hydromorphone Hcl Rash and Hives    "Rash all over"  . Penicillins Rash and Other (See Comments)    Has patient had a PCN reaction causing immediate rash, facial/tongue/throat swelling, SOB or lightheadedness with hypotension:No Has patient had a PCN reaction causing severe rash involving mucus membranes or skin necrosis:No Has patient had a PCN reaction that required hospitalization:No Has patient had a PCN reaction occurring within the last 10 years:No If all of the above answers are "NO", then may proceed with Cephalosporin use.   Recent Results (from  the past 2160 hour(s))  Basic metabolic panel     Status: Abnormal   Collection Time: 12/25/18  9:14 PM  Result Value Ref Range   Sodium 141 135 - 145 mmol/L   Potassium 3.7 3.5 - 5.1 mmol/L   Chloride 108 98 - 111 mmol/L   CO2 25 22 - 32 mmol/L   Glucose, Bld 163 (H) 70 - 99 mg/dL   BUN 18 6 - 20 mg/dL   Creatinine, Ser 0.90 0.44 - 1.00 mg/dL   Calcium 8.9 8.9 - 10.3 mg/dL   GFR calc non Af Amer >60 >60 mL/min   GFR calc Af Amer >60 >60 mL/min   Anion gap 8 5 - 15    Comment: Performed at [facility], [address] [place], [place] [address]  CBC     Status: Abnormal   Collection Time: 12/25/18  9:14 PM  Result Value Ref Range   WBC 4.6 4.0 - 10.5 K/uL   RBC 4.06 3.87 - 5.11 MIL/uL   Hemoglobin 11.9 (L) 12.0 - 15.0 g/dL   HCT 38.4 36.0 - 46.0 %   MCV 94.6 80.0 - 100.0 fL   MCH 29.3 26.0 - 34.0 pg   MCHC 31.0 30.0 - 36.0 g/dL   RDW 13.2 11.5 - 15.5 %   Platelets 137 (L) 150 - 400 K/uL   nRBC 0.0 0.0 - 0.2 %    Comment: Performed at [facility], [address] [place], [place] [address]  Troponin I - Add-On to previous collection     Status: None   Collection Time: 12/25/18  9:14 PM  Result Value Ref Range   Troponin I <0.03 <0.03 ng/mL    Comment: Performed at [facility], [address] [place], [place] [address]  Urinalysis, Complete w Microscopic     Status: Abnormal   Collection Time: 12/26/18  2:36 AM  Result Value Ref Range   Color, Urine YELLOW (A) YELLOW   APPearance CLEAR (A) CLEAR   Specific Gravity, Urine 1.008 1.005 - 1.030   pH 7.0 5.0 - 8.0   Glucose, UA NEGATIVE NEGATIVE mg/dL   Hgb urine dipstick NEGATIVE NEGATIVE   Bilirubin Urine NEGATIVE NEGATIVE   Ketones, ur NEGATIVE NEGATIVE mg/dL   Protein, ur NEGATIVE NEGATIVE mg/dL   Nitrite NEGATIVE NEGATIVE   Leukocytes, UA NEGATIVE NEGATIVE   RBC / HPF 0-5 0 - 5 RBC/hpf   WBC, UA 0-5 0 - 5 WBC/hpf   Bacteria, UA NONE SEEN NONE SEEN   Squamous Epithelial / LPF NONE  SEEN 0 - 5   Mucus PRESENT     Comment: Performed at [facility], [address] [place], [place] [address]  CBC with Differential/Platelet     Status: Abnormal   Collection Time: 02/09/19  9:52 AM  Result Value Ref Range   WBC 3.6 (L) 4.0 - 10.5 K/uL   RBC 4.10  3.87 - 5.11 MIL/uL   Hemoglobin 12.0 12.0 - 15.0 g/dL   HCT 37.5 36.0 - 46.0 %   MCV 91.5 80.0 - 100.0 fL   MCH 29.3 26.0 - 34.0 pg   MCHC 32.0 30.0 - 36.0 g/dL   RDW 13.4 11.5 - 15.5 %   Platelets 144 (L) 150 - 400 K/uL   nRBC 0.0 0.0 - 0.2 %   Neutrophils Relative % 42 %   Neutro Abs 1.5 (L) 1.7 - 7.7 K/uL   Lymphocytes Relative 44 %   Lymphs Abs 1.6 0.7 - 4.0 K/uL   Monocytes Relative 10 %   Monocytes Absolute 0.4 0.1 - 1.0 K/uL   Eosinophils Relative 4 %   Eosinophils Absolute 0.1 0.0 - 0.5 K/uL   Basophils Relative 0 %   Basophils Absolute 0.0 0.0 - 0.1 K/uL   Immature Granulocytes 0 %   Abs Immature Granulocytes 0.01 0.00 - 0.07 K/uL    Comment: Performed at [facility], [address] [place], [place] [address]  Comprehensive metabolic panel     Status: Abnormal   Collection Time: 02/09/19  9:52 AM  Result Value Ref Range   Sodium 142 135 - 145 mmol/L   Potassium 3.6 3.5 - 5.1 mmol/L   Chloride 111 98 - 111 mmol/L   CO2 23 22 - 32 mmol/L   Glucose, Bld 106 (H) 70 - 99 mg/dL   BUN 13 6 - 20 mg/dL   Creatinine, Ser 0.90 0.44 - 1.00 mg/dL   Calcium 8.7 (L) 8.9 - 10.3 mg/dL   Total Protein 6.3 (L) 6.5 - 8.1 g/dL   Albumin 4.1 3.5 - 5.0 g/dL   AST 26 15 - 41 U/L   ALT 30 0 - 44 U/L   Alkaline Phosphatase 69 38 - 126 U/L   Total Bilirubin 0.7 0.3 - 1.2 mg/dL   GFR calc non Af Amer >60 >60 mL/min   GFR calc Af Amer >60 >60 mL/min   Anion gap 8 5 - 15    Comment: Performed at [facility], [address] [place], [place] [address]  Lipase, blood     Status: None   Collection Time: 02/09/19  9:52 AM  Result Value Ref Range   Lipase 21 11 - 51 U/L    Comment: Performed at  [facility], [address] [place], [place] [address]  TSH     Status: None   Collection Time: 02/09/19  9:52 AM  Result Value Ref Range   TSH 3.824 0.350 - 4.500 uIU/mL    Comment: Performed by a 3rd Generation assay with a functional sensitivity of <=0.01 uIU/mL. Performed at [facility], [address] [place], [place] [address]   Troponin I - Add-On to previous collection     Status: None   Collection Time: 02/09/19  9:52 AM  Result Value Ref Range   Troponin I <0.03 <0.03 ng/mL    Comment: Performed at [facility], [address] [place], [place] [address]  T4, free     Status: Abnormal   Collection Time: 02/09/19  9:52 AM  Result Value Ref Range   Free T4 0.63 (L) 0.82 - 1.77 ng/dL    Comment: (NOTE) Biotin ingestion may interfere with free T4 tests. If the results are inconsistent with the TSH level, previous test results, or the clinical presentation, then consider biotin interference. If needed, order repeat testing after stopping biotin. Performed at [facility], [place]  [address], [place], [place] [address]   Brain natriuretic peptide     Status: None   Collection Time: 02/09/19  9:52 AM  Result Value Ref Range   B Natriuretic Peptide 11.0 0.0 - 100.0 pg/mL    Comment: Performed at [facility], [address] [place], [place] [address]  Influenza panel by PCR (type A & B)     Status: None   Collection Time: 02/09/19  1:21 PM  Result Value Ref Range   Influenza A By PCR NEGATIVE NEGATIVE   Influenza B By PCR NEGATIVE NEGATIVE    Comment: (NOTE) The Xpert Xpress Flu assay is intended as an aid in the diagnosis of  influenza and should not be used as a sole basis for treatment.  This  assay is FDA approved for nasopharyngeal swab specimens only. Nasal  washings and aspirates are unacceptable for Xpert Xpress Flu testing. Performed at [facility], [address] [place], [place] [address]    Urinalysis, Complete w Microscopic     Status: Abnormal   Collection Time: 02/09/19  1:22 PM  Result Value Ref Range   Color, Urine YELLOW (A) YELLOW   APPearance CLEAR (A) CLEAR   Specific Gravity, Urine 1.015 1.005 - 1.030   pH 6.0 5.0 - 8.0   Glucose, UA NEGATIVE NEGATIVE mg/dL   Hgb urine dipstick NEGATIVE NEGATIVE   Bilirubin Urine NEGATIVE NEGATIVE   Ketones, ur NEGATIVE NEGATIVE mg/dL   Protein, ur NEGATIVE NEGATIVE mg/dL   Nitrite NEGATIVE NEGATIVE   Leukocytes,Ua TRACE (A) NEGATIVE   RBC / HPF 0-5 0 - 5 RBC/hpf   WBC, UA 0-5 0 - 5 WBC/hpf   Bacteria, UA NONE SEEN NONE SEEN   Squamous Epithelial / LPF 0-5 0 - 5   Mucus PRESENT     Comment: Performed at [facility], [address] [place], [place] [address]  Urine culture     Status: None   Collection Time: 02/09/19  1:22 PM  Result Value Ref Range   Specimen Description      URINE, RANDOM Performed at [facility], [address], [place], [place] [address]    Special Requests      NONE Performed at [facility], [address], [place], [place] [address]    Culture      NO GROWTH Performed at [facility], [place] [address] [place], [place] [address]    Report Status 02/12/2019 FINAL    Objective  Body mass index is 32.86 kg/m. Wt Readings from Last 3 Encounters:  02/14/19 229 lb (103.9 kg)  12/25/18 230 lb (104.3 kg)  10/06/18 234 lb 12.8 oz (106.5 kg)   Temp Readings from Last 3 Encounters:  02/14/19 98 F (36.7 C) (Oral)  12/25/18 98.5 F (36.9 C) (Oral)  10/06/18 98.8 F (37.1 C)   BP Readings from Last 3 Encounters:  02/14/19 128/78  02/09/19 108/62  12/26/18 (!) 112/57   Pulse Readings from Last 3 Encounters:  02/14/19 87  02/09/19 76  12/26/18 72    Physical Exam Vitals signs and nursing note reviewed.  Constitutional:      Appearance: Normal appearance. She is well-developed and well-groomed.  HENT:     Head: Normocephalic and atraumatic.      Mouth/Throat:     Mouth: Mucous membranes are moist.     Pharynx: Oropharynx is clear.  Cardiovascular:     Rate and Rhythm: Normal rate and regular rhythm.  Heart sounds: Normal heart sounds.     Comments: 1 to 2+ leg edema b/l   Pulmonary:     Effort: Pulmonary effort is normal.     Breath sounds: Normal breath sounds.  Neurological:     General: No focal deficit present.     Mental Status: She is alert and oriented to person, place, and time.     Gait: Gait normal.  Psychiatric:        Attention and Perception: Attention and perception normal.        Mood and Affect: Mood and affect normal.        Speech: Speech normal.        Behavior: Behavior normal. Behavior is cooperative.        Thought Content: Thought content normal.        Cognition and Memory: Cognition and memory normal.        Judgment: Judgment normal.   tremor on exam   Assessment   1 leg pain and edema b/l legs etiology could be due to chronic prednisone she has tapered down to 4 mg qd, she denies salt intake. She called her cardiologist and they do not think related to her heart. [place] 02/09/2019 negative DVT b/l  2. Connective tissue d/o, undifferentiated though she has been dx'ed with SLE in the past by 2-3 different other rheumatologists  3. RUQ ab pain s/p GB removal r/o budd chiari due to h/o #2 and risk for clot/DVT. She also has chronic constipation due to chronic pain medications though denies she is currently constipated  4. HM  5. Disability paperwork for [place] [employer]  6. Tremor on exam prev noted as ET pt c/w PD, h/o migraines  Plan   1.  Reviewed US DVT b/l legs 02/09/2019 negative  Lasix 20 mg in am x 3 days if not better increase to 40 mg qam  Call back in 1 week with progress  2.  F/u rheumatology 05/16/19 [facility]  Ask about eye exam at f/u  3. US doppler liver negative  rec prn laxatives otc  Consider trial of linzess  4.  Had flu shot had  Had pna 23, prevnar, Tdap  Think about  shingrix vaccine disc todayagain on backorder  ImmuneMMR and hep B  See other HM 11/03/17  Colonoscopy 2015 h/o polys DEXA 06/29/17 osteopenia h/o osteoporosis endocrine will see 08/21/19 Dr. Julious Oka and he was to try Reclast again though listed on her allergy list he does not think true allergy  Mammogram 06/15/18 mammo negative  Pap8/2/19 Dr. Marcelline Mates neg pap neg HPV  Of note CT 09/2017 with ground glass appearance will need repeat in 11/2019never smoker  5.  Filled out disability paperwork  6. Sent Dr. Manuella Ghazi a message appt with him 05/09/2019   Upcoming appts  Of note neurology appt Dr. Joneen Boers 05/09/2019 [facility] 05/16/19  Endocrine 08/21/2019 [facility] Dr. Prudencio Burly  Provider: Dr. Olivia Mackie McLean-Scocuzza-Internal Medicine

## 2019-02-14 NOTE — Progress Notes (Signed)
Pre visit review using our clinic review tool, if applicable. No additional management support is needed unless otherwise documented below in the visit note. 

## 2019-02-15 ENCOUNTER — Ambulatory Visit
Admission: RE | Admit: 2019-02-15 | Discharge: 2019-02-15 | Disposition: A | Discharge status: Home / Self Care | Payer: Medicare Other | Source: Ambulatory Visit | Attending: Internal Medicine | Admitting: Internal Medicine

## 2019-02-15 DIAGNOSIS — R1011 Right upper quadrant pain: Secondary | ICD-10-CM

## 2019-02-16 ENCOUNTER — Telehealth: Payer: Self-pay

## 2019-02-16 NOTE — Telephone Encounter (Signed)
We have not contacted patient as of yet.

## 2019-02-16 NOTE — Telephone Encounter (Signed)
Copied from [place] [id number]. Topic: General - Call Back - No Documentation >> Feb 16, 2019 11:53 AM Ahmed Prima L wrote: Reason for CRM: patient said she missed a call from office. Is it for her ultra sound results?

## 2019-02-20 ENCOUNTER — Encounter: Payer: Self-pay | Admitting: Internal Medicine

## 2019-02-24 ENCOUNTER — Ambulatory Visit: Payer: Medicare Other | Admitting: Internal Medicine

## 2019-02-28 ENCOUNTER — Encounter: Payer: Self-pay | Admitting: Internal Medicine

## 2019-03-15 ENCOUNTER — Ambulatory Visit: Payer: Self-pay

## 2019-03-15 ENCOUNTER — Ambulatory Visit (INDEPENDENT_AMBULATORY_CARE_PROVIDER_SITE_OTHER): Payer: Medicare Other | Admitting: Internal Medicine

## 2019-03-15 DIAGNOSIS — R609 Edema, unspecified: Secondary | ICD-10-CM | POA: Diagnosis not present

## 2019-03-15 DIAGNOSIS — L03115 Cellulitis of right lower limb: Secondary | ICD-10-CM

## 2019-03-15 MED ORDER — DOXYCYCLINE HYCLATE 100 MG PO TABS: 100.0000 mg | ORAL_TABLET | Freq: Two times a day (BID) | ORAL | 0 refills | Status: DC

## 2019-03-15 NOTE — Telephone Encounter (Signed)
Is this appropriate for a virtual visit or Urgent Care?  Please advise.

## 2019-03-15 NOTE — Patient Instructions (Signed)
Vasculitis  Vasculitis is inflammation of the blood vessels. With vasculitis, the blood vessels can become thick, narrow, scarred, or weak. Enough blood may not be able to flow through them. This can cause damage to the muscles, kidneys, lungs, brain, and other parts of the body. There are many types of vasculitis. The different types may affect different kinds of blood vessels or different areas of the body. Some types last only a short time, while others last a long time. What are the causes? The exact cause of this condition is not known. However, vasculitis can develop when the body's defense system (immune system) attacks its own blood vessels. This attack can be caused by:  An infection.  An immune system disease, such as lupus, rheumatoid arthritis, or scleroderma.  An allergic reaction to a medicine.  A cancer that affects blood cells, such as leukemia or lymphoma. What increases the risk? The following factors may make you more likely to develop this condition:  Being a smoker.  Being under stress.  Having a physical injury. What are the signs or symptoms? Symptoms of this condition depend on the type of vasculitis that you have. Symptoms that are common to all types of vasculitis include:  Fever.  Poor appetite.  Weight loss.  Feeling very tired (fatigue).  Having aches and pains.  Weakness.  Numbness in an area of your body. Symptoms for specific types of vasculitis include:  Skin problems, such as sores, spots, or rashes.  Trouble seeing.  Trouble breathing.  Coughing up blood.  Blood in your urine.  Headaches.  Stomach pain.  Stuffy or bloody nose. How is this diagnosed? This condition may be diagnosed based on:  Your symptoms.  A physical exam. You may also have tests, including:  Blood tests.  A urine test.  A biopsy of a blood vessel.  A test to measure the electrical signals moving through nerves (nerve conduction  study).  Imaging tests, such as: ? X-rays. ? CT scan. ? Ultrasound. ? MRI. ? Angiogram. How is this treated? Treatment for this condition will depend on the type of vasculitis that you have and how severe the symptoms are. Sometimes treatment is not needed. Treatment often includes:  Medicines.  Physical therapy or occupational therapy. This helps strengthen muscles that were weakened by the disease. You will need to see your health care provider while you are being treated. During follow-up visits, your health care provider may:  Perform blood tests and bone density tests.  Check your blood pressure and blood sugar.  Check for side effects of any medicines you are taking. Vasculitis cannot always be cured. Sometimes symptoms go away but the disease does not (the disease goes into remission). If symptoms return, increased treatment may be needed. Follow these instructions at home:  Take over-the-counter and prescription medicines only as told by your health care provider.  Exercise as directed. Talk with your health care provider about what exercises are okay for you to do. Exercises that increase your heart rate (aerobic exercise), such as walking, are usually recommended. Aerobic exercise helps control your blood pressure and prevent bone loss.  Follow a healthy diet. Make sure your diet includes fruits, vegetables, whole grains, and healthy sources of protein.  Learn as much as you can about vasculitis, and consider joining a support group. ? Talk to other people who have your condition. This may help you cope with the illness. ? Talk with your health care provider if you feel stressed, anxious, or   depressed.  Keep all follow-up visits as told by your health care provider. This is important. Contact a health care provider if:  Your symptoms return or you have new symptoms.  Your fever, fatigue, headache, or weight loss gets worse.  You have signs of infection, such as redness,  swelling, tenderness, warmth, or a new fever.  Your pain does not go away, even after you take pain medicine.  Your nose bleeds. Get help right away if:  Your vision gets worse.  You have chest pain or stomach pain.  You have trouble breathing.  One side of your face or body suddenly becomes weak or numb.  There is blood in your urine. Summary  Vasculitis is inflammation of the blood vessels that may cause them to become thick, narrow, scarred, or weak. Enough blood may not be able to flow through them. This can cause damage throughout your body.  The exact cause of this condition is not known. However, vasculitis can develop when the body's immune system attacks its own blood vessels. This attack may be caused by an infection, an immune system disease, an allergic reaction to a medicine, or a cancer that affects blood cells, such as leukemia or lymphoma.  Vasculitis cannot always be cured. Sometimes symptoms go away but the disease does not (the disease goes into remission). If symptoms return, increased treatment may be needed. This information is not intended to replace advice given to you by your health care provider. Make sure you discuss any questions you have with your health care provider. Document Released: 09/12/2009 Document Revised: 12/07/2017 Document Reviewed: 12/07/2017 Elsevier Interactive Patient Education  2019 [employer]  Cellulitis, Adult  Cellulitis is a skin infection. The infected area is usually warm, red, swollen, and tender. This condition occurs most often in the arms and lower legs. The infection can travel to the muscles, blood, and underlying tissue and become serious. It is very important to get treated for this condition. What are the causes? Cellulitis is caused by bacteria. The bacteria enter through a break in the skin, such as a cut, burn, insect bite, open sore, or crack. What increases the risk? This condition is more likely to occur in  people who:  Have a weak body defense system (immune system).  Have open wounds on the skin, such as cuts, burns, bites, and scrapes. Bacteria can enter the body through these open wounds.  Are older than 55 years of age.  Have diabetes.  Have a type of long-lasting (chronic) liver disease (cirrhosis) or kidney disease.  Are obese.  Have a skin condition such as: ? Itchy rash (eczema). ? Slow movement of blood in the veins (venous stasis). ? Fluid buildup below the skin (edema).  Have had radiation therapy.  Use IV drugs. What are the signs or symptoms? Symptoms of this condition include:  Redness, streaking, or spotting on the skin.  Swollen area of the skin.  Tenderness or pain when an area of the skin is touched.  Warm skin.  A fever.  Chills.  Blisters. How is this diagnosed? This condition is diagnosed based on a medical history and physical exam. You may also have tests, including:  Blood tests.  Imaging tests. How is this treated? Treatment for this condition may include:  Medicines, such as antibiotic medicines or medicines to treat allergies (antihistamines).  Supportive care, such as rest and application of cold or warm cloths (compresses) to the skin.  Hospital care, if the condition is severe. The  infection usually starts to get better within 1-2 days of treatment. Follow these instructions at home:  Medicines  Take over-the-counter and prescription medicines only as told by your health care provider.  If you were prescribed an antibiotic medicine, take it as told by your health care provider. Do not stop taking the antibiotic even if you start to feel better. General instructions  Drink enough fluid to keep your urine pale yellow.  Do not touch or rub the infected area.  Raise (elevate) the infected area above the level of your heart while you are sitting or lying down.  Apply warm or cold compresses to the affected area as told by your  health care provider.  Keep all follow-up visits as told by your health care provider. This is important. These visits let your health care provider make sure a more serious infection is not developing. Contact a health care provider if:  You have a fever.  Your symptoms do not begin to improve within 1-2 days of starting treatment.  Your bone or joint underneath the infected area becomes painful after the skin has healed.  Your infection returns in the same area or another area.  You notice a swollen bump in the infected area.  You develop new symptoms.  You have a general ill feeling (malaise) with muscle aches and pains. Get help right away if:  Your symptoms get worse.  You feel very sleepy.  You develop vomiting or diarrhea that persists.  You notice red streaks coming from the infected area.  Your red area gets larger or turns dark in color. These symptoms may represent a serious problem that is an emergency. Do not wait to see if the symptoms will go away. Get medical help right away. Call your local emergency services (911 in the [place] Do not drive yourself to the hospital. Summary  Cellulitis is a skin infection. This condition occurs most often in the arms and lower legs.  Treatment for this condition may include medicines, such as antibiotic medicines or antihistamines.  Take over-the-counter and prescription medicines only as told by your health care provider. If you were prescribed an antibiotic medicine, do not stop taking the antibiotic even if you start to feel better.  Contact a health care provider if your symptoms do not begin to improve within 1-2 days of starting treatment or your symptoms get worse.  Keep all follow-up visits as told by your health care provider. This is important. These visits let your health care provider make sure that a more serious infection is not developing. This information is not intended to replace advice given to you by your  health care provider. Make sure you discuss any questions you have with your health care provider. Document Released: 08/26/2005 Document Revised: 04/07/2018 Document Reviewed: 04/07/2018 Elsevier Interactive Patient Education  2019 [employer].

## 2019-03-15 NOTE — Progress Notes (Signed)
Virtual Visit via Video Note [facility]  I connected with [name] [name]  on 03/15/19 at 10:00 AM EDT by a video enabled telemedicine application and verified that I am speaking with the correct person using two identifiers.  Location patient: home Location provider:work  Persons participating in the virtual visit: patient, provider, pts daughter and mother   I discussed the limitations of evaluation and management by telemedicine and the availability of in person appointments. The patient expressed understanding and agreed to proceed.   HPI: Today c/o leg swelling R>L and red streak on right food and ankle warm and tender to touch painful and when presses it it blanches. No bites or cuts [place] 02/09/2019 neg DVT b/l legs she is taking Lasix 20 mg daily which helped initially with leg swelling    ROS: See pertinent positives and negatives per HPI.  Past Medical History:  Diagnosis Date  . ADHD   . Anemia   . ANXIETY 06/06/2007  . Arthritis    related to lupus  . Asthma   . BACK PAIN 02/22/2009  . Cervical cancer ([place])    LEEP 2005   . Chicken pox   . Chronic pain    goes to pain management provider  . Collagen vascular disease ([place])   . Connective tissue disease ([place])   . DEPRESSION 06/06/2007  . Essential tremor   . Fibromyalgia   . Fibromyalgia   . FREQUENCY, URINARY 07/07/2007  . Gastritis   . GERD 06/06/2007  . Glaucoma   . Headache    h/o migraines   . History of hiatal hernia   . History of kidney stones   . HYPOTHYROIDISM 06/06/2007  . INTERSTITIAL CYSTITIS 09/11/2010  . Lupus ([place]) 2006  . Lupus ([place])   . Menopause   . Migraine   . Mitral valve regurgitation   . NEPHROLITHIASIS, HX OF 11/05/2010  . Neuropathy   . Neuropathy   . OCD (obsessive compulsive disorder)   . Optic neuritis    2005  . OSTEOPENIA 10/14/2009  . Osteoporosis   . OVERACTIVE BLADDER 10/14/2009  . PAIN, CHRONIC NEC 06/06/2007  . Panniculitis   . POLYARTHRITIS 08/19/2007  . Raynaud's disease    . Sicca syndrome ([place])   . 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 Vaginal prolapse    Dr. Marcelline Mates [facility]   . Vasculitis ([place])   . WEIGHT GAIN 02/22/2009    Past Surgical History:  Procedure Laterality Date  . APPENDECTOMY    . CERVICAL BIOPSY  W/ LOOP ELECTRODE EXCISION  2005  . CHOLECYSTECTOMY    . ESOPHAGOGASTRODUODENOSCOPY (EGD) WITH PROPOFOL N/A 08/27/2017   Procedure: ESOPHAGOGASTRODUODENOSCOPY (EGD) WITH PROPOFOL;  Surgeon: Manya Silvas, MD;  Location: [facility] ENDOSCOPY;  Service: Endoscopy;  Laterality: N/A;  . HAND SURGERY     repair of left metatarsal   . HARDWARE REMOVAL Right 02/16/2017   Procedure: HARDWARE REMOVAL FROM HIP;  Surgeon: Hessie Knows, MD;  Location: [facility] ORS;  Service: Orthopedics;  Laterality: Right;  . JOINT REPLACEMENT    . OTHER SURGICAL HISTORY     left 3rd metatarsal fracture repair 2011   . REVISION TOTAL HIP ARTHROPLASTY  06/2009   replacement 2010 then screw and plate removal 2536; right hip  . TONSILLECTOMY    . TONSILLECTOMY    . wrist  ligament repair bilateral    . WRIST RECONSTRUCTION    . WRIST SURGERY     laceration of right wrist ligaments  . wrist surgery  laceration of left wrist surgery     Family History  Problem Relation Age of Onset  . Hypertension Mother   . Arthritis Mother   . Heart disease Mother        afib  . Asthma Sister   . Asthma Daughter   . Arthritis Daughter   . Heart disease Daughter        ?heart condition on BB  . ADD / ADHD Daughter   . Pancreatic cancer Father   . Cancer Father        pancreatitic   . Diabetes Maternal Grandmother   . Heart disease Maternal Grandmother   . Arthritis Maternal Grandmother   . Hypertension Maternal Grandmother   . Colon cancer Maternal Uncle 72  . Crohn's disease Maternal Aunt   . Diabetes Maternal Aunt   . Breast cancer Cousin 26       maternal  . Cancer Cousin        m cousin breat cancer s/p removal both breasts     SOCIAL HX: disability lives with  daughter and mother    Current Outpatient Medications:  .  albuterol (VENTOLIN HFA) 108 (90 Base) MCG/ACT inhaler, Inhale 1-2 puffs into the lungs every 4 (four) hours as needed for wheezing or shortness of breath., Disp: 1 Inhaler, Rfl: 12 .  ARIPiprazole (ABILIFY) 20 MG tablet, Take 20 mg by mouth daily., Disp: , Rfl:  .  Ascorbic Acid (VITAMIN C) 500 MG CHEW, Chew 2,500 mg by mouth daily., Disp: , Rfl:  .  aspirin 325 MG tablet, Take 1 tablet (325 mg total) by mouth daily., Disp: 30 tablet, Rfl: 0 .  Biotin 10 MG CAPS, Take by mouth., Disp: , Rfl:  .  Biotin 5000 MCG SUBL, Place 10,000 mcg under the tongue daily., Disp: , Rfl:  .  budesonide-formoterol (SYMBICORT) 160-4.5 MCG/ACT inhaler, Symbicort 160 mcg-4.5 mcg/actuation HFA aerosol inhaler, Disp: , Rfl:  .  calcipotriene (DOVONOX) 0.005 % ointment, calcipotriene 0.005 % topical ointment  APPLY A THIN LAYER TO THE AFFECTED AREA(S) BY TOPICAL ROUTE ONCE DAILY ; RUB IN GENTLY AND COMPLETELY, Disp: , Rfl:  .  Cholecalciferol (VITAMIN D3) 2000 units capsule, Vitamin D3 2,000 unit capsule, Disp: , Rfl:  .  dapsone 25 MG tablet, Take by mouth daily. , Disp: , Rfl:  .  dexmethylphenidate (FOCALIN XR) 20 MG 24 hr capsule, dexmethylphenidate ER 20 mg capsule,extended release biphasic50-50, Disp: , Rfl:  .  diclofenac sodium (VOLTAREN) 1 % GEL, Apply 2 g topically 4 (four) times daily as needed (for knee & finger pain.). , Disp: , Rfl:  .  dicyclomine (BENTYL) 10 MG capsule, dicyclomine 10 mg capsule  every 8hours as needed, Disp: , Rfl:  .  doxycycline (VIBRA-TABS) 100 MG tablet, Take 1 tablet (100 mg total) by mouth 2 (two) times daily. With food, Disp: 14 tablet, Rfl: 0 .  DULoxetine (CYMBALTA) 60 MG capsule, Take 60 mg by mouth 2 (two) times daily., Disp: , Rfl:  .  Elastic Bandages & Supports (MEDICAL COMPRESSION STOCKINGS) MISC, Please provide 80mmHg compression stockings, Disp: 1 each, Rfl: 0 .  etodolac (LODINE) 500 MG tablet, etodolac 500 mg  tablet, Disp: , Rfl:  .  fentaNYL (DURAGESIC - DOSED MCG/HR) 75 MCG/HR, Place 75 mcg onto the skin every 3 (three) days., Disp: , Rfl:  .  fluocinonide ointment (LIDEX) 0.05 %, fluocinonide 0.05 % topical ointment  APPLY TO THE AFFECTED AREA(S) BY TOPICAL ROUTE 2 TIMES PER DAY, Disp: ,  Rfl:  .  folic acid (FOLVITE) 1 MG tablet, Take 1 tablet (1 mg total) by mouth daily., Disp: 90 tablet, Rfl: 3 .  furosemide (LASIX) 20 MG tablet, Take 20 mg by mouth daily as needed. , Disp: , Rfl:  .  hydrocortisone (PROCTOZONE-HC) 2.5 % rectal cream, Proctozone-HC 2.5 % topical cream perineal applicator, Disp: , Rfl:  .  hydroxychloroquine (PLAQUENIL) 200 MG tablet, Plaquenil 200 mg tablet  Take 1 tablet twice a day by oral route after meals for 90 days., Disp: , Rfl:  .  ibuprofen (ADVIL,MOTRIN) 800 MG tablet, Take 1 tablet (800 mg total) by mouth every 8 (eight) hours as needed., Disp: 30 tablet, Rfl: 0 .  imiquimod (ALDARA) 5 % cream, imiquimod 5 % topical cream packet  APPLY TO THE AFFECTED AREA(S) BY TOPICAL ROUTE 3 TIMES PER WEEK at bedtime, Disp: , Rfl:  .  isosorbide mononitrate (IMDUR) 60 MG 24 hr tablet, Take 1 tablet (60 mg total) by mouth daily., Disp: 20 tablet, Rfl: 0 .  ketorolac (TORADOL) 10 MG tablet, Take 1 tablet (10 mg total) every 8 (eight) hours as needed by mouth for moderate pain (with food)., Disp: 10 tablet, Rfl: 0 .  levothyroxine (SYNTHROID, LEVOTHROID) 25 MCG tablet, Take 1 tablet (25 mcg total) by mouth daily before breakfast., Disp: 90 tablet, Rfl: 3 .  LORazepam (ATIVAN) 2 MG tablet, Take 1-2 mg by mouth See admin instructions. 1 MG DAILY AS NEEDED FOR ANXIETY & SCHEDULED AT BEDTIME EACH NIGHT, Disp: , Rfl:  .  meclizine (ANTIVERT) 25 MG tablet, Take 1 tablet (25 mg total) by mouth 3 (three) times daily as needed for dizziness or nausea., Disp: 20 tablet, Rfl: 0 .  methylphenidate 18 MG PO CR tablet, Take by mouth. , Disp: , Rfl:  .  montelukast (SINGULAIR) 10 MG tablet, montelukast 10  mg tablet 1 pill qhs, Disp: 90 tablet, Rfl: 3 .  Multiple Vitamins-Minerals (MULTI-VITAMIN GUMMIES PO), Multi Vitamin  GUMMIES, Disp: , Rfl:  .  omeprazole (PRILOSEC) 40 MG capsule, Take 40 mg by mouth 2 (two) times daily., Disp: , Rfl:  .  ondansetron (ZOFRAN) 4 MG tablet, Zofran 8 mg tablet  Take 1 tablet every 8 hours by oral route as needed., Disp: , Rfl:  .  pantoprazole (PROTONIX) 40 MG tablet, Take one tablet by mouth twice daily (Patient taking differently: TAKE 1 TABLET (40 MG) BY MOUTH ONCE DAILY IN THE MORNING.), Disp: 180 tablet, Rfl: 3 .  PARoxetine (PAXIL) 10 MG tablet, Take 1 tablet (10 mg total) by mouth at bedtime., Disp: 30 tablet, Rfl: 11 .  polyethylene glycol powder (GLYCOLAX/MIRALAX) powder, Take 17 g by mouth daily., Disp: 255 g, Rfl: 0 .  Polyvinyl Alcohol-Povidone (REFRESH OP), Place 1 drop into both eyes 2 (two) times daily., Disp: , Rfl:  .  predniSONE (DELTASONE) 10 MG tablet, prednisone 10 mg tablet  1 daily, Disp: , Rfl:  .  Probiotic Product (PROBIOTIC PO), Probiotic 10 billion cell capsule  daily, Disp: , Rfl:  .  silver sulfADIAZINE (SILVADENE) 1 % cream, silver sulfadiazine 1 % topical cream, Disp: , Rfl:  .  Sodium Chloride 3 % AERS, Saline Nasal Mist 3 %  spray nose deeply every hour or two and blow nose, Disp: , Rfl:  .  sucralfate (CARAFATE) 1 g tablet, sucralfate 1 gram tablet, Disp: , Rfl:  .  topiramate (TOPAMAX) 100 MG tablet, topiramate 100 mg tablet  TAKE 1 TABLET BY MOUTH 2 TIMES DAILY., Disp: ,  Rfl:  .  valACYclovir (VALTREX) 500 MG tablet, valacyclovir 500 mg tablet  1 daily, Disp: , Rfl:   EXAM:  VITALS per patient if applicable:  GENERAL: alert, oriented, appears well and in no acute distress  HEENT: atraumatic, conjunttiva clear, no obvious abnormalities on inspection of external nose and ears  NECK: normal movements of the head and neck  LUNGS: on inspection no signs of respiratory distress, breathing rate appears normal, no obvious gross  SOB, gasping or wheezing  CV: no obvious cyanosis  MS: moves all visible extremities without noticeable abnormality  PSYCH/NEURO: pleasant and cooperative, no obvious depression or anxiety, speech and thought processing grossly intact  Skin: leg edema on visual inspection with top of foot with horizontal red streak radiating to right ankle upward leg   ASSESSMENT AND PLAN:  Discussed the following assessment and plan:  Cellulitis of right lower extremity - Plan: doxycycline (VIBRA-TABS) 100 MG tablet Bid x 1 week   Other ddx vasculitis ? -rec pt reach out to [facility] with [facility] to see if they deem this could be vasculitis and send my chart photo   Edema leg  -trial of lasix 40 mg qam x 3 days and call or mychart in 3 days   I discussed the assessment and treatment plan with the patient. The patient was provided an opportunity to ask questions and all were answered. The patient agreed with the plan and demonstrated an understanding of the instructions.   The patient was advised to call back or seek an in-person evaluation if the symptoms worsen or if the condition fails to improve as anticipated.  Time spent 15 minutes  Delorise Jackson, MD

## 2019-03-15 NOTE — Telephone Encounter (Signed)
Patient has aoppointment today

## 2019-03-15 NOTE — Telephone Encounter (Signed)
Pt. called to report she woke up with severe swelling in right foot/ ankle with a "bright red streak on center top of the foot, heading toward the ankle."  Estimated the red streak is approx. 1 " wide and 2/5 " long. Stated the area is very tender to touch and painful to walk on.  Rated the pain at 8.5/10.  Stated she does not see any signs of an insect or spider bite.  Reported she has had swelling in both feet for about one month; was given Furosemide, without improvement.  Stated left foot is moderately swollen, and right foot is severely swollen today.  Denied calf pain or shortness of breath.  Denied chest pain.  Does not have thermometer; c/o feeling sweaty.    Called [facility]; spoke with Butch Penny.  Will send Triage note for PCP to review and make further recommendations for eval.  Pt. Advised of the plan.  Agreed.     Reason for Disposition . [1] Red area or streak [2] large (> 2 in. or 5 cm)  Answer Assessment - Initial Assessment Questions 1. APPEARANCE of RASH: "Describe the rash."      Bright red streak on top center of foot ; heading towards ankle 2. LOCATION: "Where is the rash located?"      *No Answer* 3. NUMBER: "How many spots are there?"      *No Answer* 4. SIZE: "How big are the spots?" (Inches, centimeters or compare to size of a coin)      Swelling of right foot and ankle  5. ONSET: "When did the rash start?"      *No Answer* 6. ITCHING: "Does the rash itch?" If so, ask: "How bad is the itch?"  (Scale 1-10; or mild, moderate, severe)     *No Answer* 7. PAIN: "Does the rash hurt?" If so, ask: "How bad is the pain?"  (Scale 1-10; or mild, moderate, severe)     *No Answer* 8. OTHER SYMPTOMS: "Do you have any other symptoms?" (e.g., fever)     *No Answer* 9. PREGNANCY: "Is there any chance you are pregnant?" "When was your last menstrual period?"     *No Answer*  Answer Assessment - Initial Assessment Questions 1. ONSET: "When did the swelling start?" (e.g., minutes, hours,  days)     One month with swelling in bilateral feet   2. LOCATION: "What part of the leg is swollen?"  "Are both legs swollen or just one leg?"     See above  3. SEVERITY: "How bad is the swelling?" (e.g., localized; mild, moderate, severe)  - Localized - small area of swelling localized to one leg  - MILD pedal edema - swelling limited to foot and ankle, pitting edema < 1/4 inch (6 mm) deep, rest and elevation eliminate most or all swelling  - MODERATE edema - swelling of lower leg to knee, pitting edema > 1/4 inch (6 mm) deep, rest and elevation only partially reduce swelling  - SEVERE edema - swelling extends above knee, facial or hand swelling present      Left foot moderate swelling and right foot is severe 4. REDNESS: "Does the swelling look red or infected?"     Bright red streak on top center of foot ; heading towards ankle  5. PAIN: "Is the swelling painful to touch?" If so, ask: "How painful is it?"   (Scale 1-10; mild, moderate or severe)    8.5/10 6. FEVER: "Do you have a fever?" If so, ask: "What  is it, how was it measured, and when did it start?"      Hot/ sweaty 7. CAUSE: "What do you think is causing the leg swelling?"     Unsure; was told it is not related to her heart  8. MEDICAL HISTORY: "Do you have a history of heart failure, kidney disease, liver failure, or cancer?"     Lupus, Hypothyroidism, Gastritis 9. RECURRENT SYMPTOM: "Have you had leg swelling before?" If so, ask: "When was the last time?" "What happened that time?"     Has had bilat foot swelling x one month 10. OTHER SYMPTOMS: "Do you have any other symptoms?" (e.g., chest pain, difficulty breathing)       Painful, tender area on top of right swollen foot 11. PREGNANCY: "Is there any chance you are pregnant?" "When was your last menstrual period?"       Not having cycles; menopausal  Protocols used: LEG SWELLING AND EDEMA-A-AH, RASH OR REDNESS - LOCALIZED-A-AH

## 2019-03-15 NOTE — Telephone Encounter (Signed)
Schedule telephone visit   [name] [name]

## 2019-03-30 ENCOUNTER — Other Ambulatory Visit: Payer: Self-pay | Admitting: Student

## 2019-03-30 DIAGNOSIS — K760 Fatty (change of) liver, not elsewhere classified: Secondary | ICD-10-CM

## 2019-03-30 DIAGNOSIS — R1011 Right upper quadrant pain: Secondary | ICD-10-CM

## 2019-03-30 DIAGNOSIS — R1013 Epigastric pain: Secondary | ICD-10-CM

## 2019-04-03 DIAGNOSIS — G20A1 Parkinson's disease without dyskinesia, without mention of fluctuations: Secondary | ICD-10-CM | POA: Insufficient documentation

## 2019-04-03 DIAGNOSIS — G20 Parkinson's disease: Secondary | ICD-10-CM | POA: Insufficient documentation

## 2019-04-03 HISTORY — DX: Parkinson's disease: G20

## 2019-04-07 ENCOUNTER — Emergency Department: Payer: Medicare Other

## 2019-04-07 ENCOUNTER — Other Ambulatory Visit: Payer: Self-pay

## 2019-04-07 ENCOUNTER — Emergency Department
Admission: EM | Admit: 2019-04-07 | Discharge: 2019-04-07 | Disposition: A | Discharge status: Home / Self Care | Payer: Medicare Other | Attending: Student in an Organized Health Care Education/Training Program | Admitting: Student in an Organized Health Care Education/Training Program

## 2019-04-07 ENCOUNTER — Encounter: Payer: Self-pay | Admitting: Emergency Medicine

## 2019-04-07 DIAGNOSIS — J45909 Unspecified asthma, uncomplicated: Secondary | ICD-10-CM | POA: Insufficient documentation

## 2019-04-07 DIAGNOSIS — Z96641 Presence of right artificial hip joint: Secondary | ICD-10-CM | POA: Diagnosis not present

## 2019-04-07 DIAGNOSIS — Z79899 Other long term (current) drug therapy: Secondary | ICD-10-CM | POA: Diagnosis not present

## 2019-04-07 DIAGNOSIS — R600 Localized edema: Secondary | ICD-10-CM

## 2019-04-07 DIAGNOSIS — E039 Hypothyroidism, unspecified: Secondary | ICD-10-CM | POA: Insufficient documentation

## 2019-04-07 DIAGNOSIS — Z8541 Personal history of malignant neoplasm of cervix uteri: Secondary | ICD-10-CM | POA: Insufficient documentation

## 2019-04-07 LAB — URINALYSIS, COMPLETE (UACMP) WITH MICROSCOPIC
Bacteria, UA: NONE SEEN
Bilirubin Urine: NEGATIVE
Glucose, UA: NEGATIVE mg/dL
Hgb urine dipstick: NEGATIVE
Ketones, ur: NEGATIVE mg/dL
Leukocytes,Ua: NEGATIVE
Nitrite: NEGATIVE
Protein, ur: NEGATIVE mg/dL
Specific Gravity, Urine: 1.003 — ABNORMAL LOW (ref 1.005–1.030)
Squamous Epithelial / HPF: NONE SEEN (ref 0–5)
pH: 7 (ref 5.0–8.0)

## 2019-04-07 LAB — COMPREHENSIVE METABOLIC PANEL
ALT: 15 U/L (ref 0–44)
AST: 28 U/L (ref 15–41)
Albumin: 4 g/dL (ref 3.5–5.0)
Alkaline Phosphatase: 78 U/L (ref 38–126)
Anion gap: 8 (ref 5–15)
BUN: 17 mg/dL (ref 6–20)
CO2: 24 mmol/L (ref 22–32)
Calcium: 8.6 mg/dL — ABNORMAL LOW (ref 8.9–10.3)
Chloride: 109 mmol/L (ref 98–111)
Creatinine, Ser: 0.87 mg/dL (ref 0.44–1.00)
GFR calc Af Amer: >60 mL/min (ref >60)
GFR calc non Af Amer: >60 mL/min (ref >60)
Glucose, Bld: 113 mg/dL — ABNORMAL HIGH (ref 70–99)
Potassium: 4 mmol/L (ref 3.5–5.1)
Sodium: 141 mmol/L (ref 135–145)
Total Bilirubin: 0.6 mg/dL (ref 0.3–1.2)
Total Protein: 6.5 g/dL (ref 6.5–8.1)

## 2019-04-07 LAB — CBC
HCT: 34.5 % — ABNORMAL LOW (ref 36.0–46.0)
Hemoglobin: 10.7 g/dL — ABNORMAL LOW (ref 12.0–15.0)
MCH: 28.5 pg (ref 26.0–34.0)
MCHC: 31 g/dL (ref 30.0–36.0)
MCV: 92 fL (ref 80.0–100.0)
Platelets: 144 10*3/uL — ABNORMAL LOW (ref 150–400)
RBC: 3.75 MIL/uL — ABNORMAL LOW (ref 3.87–5.11)
RDW: 13.5 % (ref 11.5–15.5)
WBC: 4.2 10*3/uL (ref 4.0–10.5)
nRBC: 0 % (ref 0.0–0.2)

## 2019-04-07 LAB — BRAIN NATRIURETIC PEPTIDE: B Natriuretic Peptide: 31 pg/mL (ref 0.0–100.0)

## 2019-04-07 LAB — LIPASE, BLOOD: Lipase: 21 U/L (ref 11–51)

## 2019-04-07 MED ORDER — FUROSEMIDE 10 MG/ML IJ SOLN
40.0000 mg | Freq: Once | INTRAMUSCULAR | Status: AC
  Administered 2019-04-07: 14:00:00 40 mg via INTRAVENOUS
  Filled 2019-04-07: qty 4

## 2019-04-07 NOTE — Discharge Instructions (Addendum)
Your exam, CXR, and labs are essentially normal at this time. Follow-up with your provider for further management. Discuss with your provider or specialist for additional medicine adjustments. Return to the ED as needed.

## 2019-04-07 NOTE — ED Notes (Signed)
Pt reports ambulates without assistance with her cane. Cane at bedside and within reach. One rail left down for pt to be able to get to toilet after lasix given. Aware to call if needs assistance. Hat in toilet.

## 2019-04-07 NOTE — ED Provider Notes (Signed)
[facility] Emergency Department Provider Note ____________________________________________  Time seen: 1300  I have reviewed the triage vital signs and the nursing notes.  HISTORY  Chief Complaint  Leg Swelling  HPI [name] [name] is a 53 y.o. female with a history of collagen vascular disorder, connective tissue disease, tremors and lupus.  She presents to the ED for evaluation of chronic persistent bilateral lower extremity edema.  Patient has been evaluated in the past by her primary provider, neurologist, and rheumatologist.  Most recently, she is been treated with doxycycline for presumed vasculitis.  She also started on Lasix 20 mg, but only took the medicine for 3 days.  Past Medical History:  Diagnosis Date  . ADHD   . Anemia   . ANXIETY 06/06/2007  . Arthritis    related to lupus  . Asthma   . BACK PAIN 02/22/2009  . Cervical cancer ([place])    LEEP 2005   . Chicken pox   . Chronic pain    goes to pain management provider  . Collagen vascular disease ([place])   . Connective tissue disease ([place])   . DEPRESSION 06/06/2007  . Essential tremor   . Fibromyalgia   . Fibromyalgia   . FREQUENCY, URINARY 07/07/2007  . Gastritis   . GERD 06/06/2007  . Glaucoma   . Headache    h/o migraines   . History of hiatal hernia   . History of kidney stones   . HYPOTHYROIDISM 06/06/2007  . INTERSTITIAL CYSTITIS 09/11/2010  . Lupus ([place]) 2006  . Lupus ([place])   . Menopause   . Migraine   . Mitral valve regurgitation   . NEPHROLITHIASIS, HX OF 11/05/2010  . Neuropathy   . Neuropathy   . OCD (obsessive compulsive disorder)   . Optic neuritis    2005  . OSTEOPENIA 10/14/2009  . Osteoporosis   . OVERACTIVE BLADDER 10/14/2009  . PAIN, CHRONIC NEC 06/06/2007  . Panniculitis   . POLYARTHRITIS 08/19/2007  . Raynaud's disease   . Sicca syndrome ([place])   . 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 Vaginal prolapse    Dr. Marcelline Mates [place]   . Vasculitis ([place])   .  WEIGHT GAIN 02/22/2009    Patient Active Problem List   Diagnosis Date Noted  . Connective tissue disorder ([place]) 02/14/2019  . RUQ abdominal pain 02/14/2019  . Bilateral leg edema 02/09/2019  . Hiatal hernia 10/06/2018  . ADHD 08/04/2018  . Lupus arthritis ([place]) 08/04/2018  . Asthma 08/04/2018  . Systemic lupus erythematosus ([place]) 08/04/2018  . Essential tremor 08/04/2018  . Urinary incontinence 08/04/2018  . Glaucoma 08/04/2018  . Migraines 08/04/2018  . Mitral valve regurgitation 08/04/2018  . Neuropathy 08/04/2018  . OCD (obsessive compulsive disorder) 08/04/2018  . Osteopenia 08/04/2018  . Thrombocytopenia ([place]) 07/06/2018  . Chest pain 07/04/2018  . Allergic rhinitis 12/16/2017  . Dizziness 12/16/2017  . Orthostatic hypotension 12/16/2017  . Generalized abdominal pain 12/16/2017  . Constipation 12/16/2017  . Acute nonintractable headache 12/13/2017  . Fibromyalgia 11/04/2017  . Status post hardware removal 02/16/2017  . Sicca syndrome ([place]) 06/04/2014  . Abdominal pain, epigastric 09/08/2013  . Chronic pain 03/31/2011  . NEPHROLITHIASIS, HX OF 11/05/2010  . INTERSTITIAL CYSTITIS 09/11/2010  . Overactive bladder 10/14/2009  . Disorder of bone and cartilage 10/14/2009  . Backache 02/22/2009  . WEIGHT GAIN 02/22/2009  . 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 Polyarthropathy or polyarthritis of multiple sites 08/19/2007  . FREQUENCY, URINARY 07/07/2007  . Hypothyroidism 06/06/2007  .  Anxiety state 06/06/2007  . Depression, recurrent ([place]) 06/06/2007  . PAIN, CHRONIC NEC 06/06/2007  . GERD 06/06/2007    Past Surgical History:  Procedure Laterality Date  . APPENDECTOMY    . CERVICAL BIOPSY  W/ LOOP ELECTRODE EXCISION  2005  . CHOLECYSTECTOMY    . ESOPHAGOGASTRODUODENOSCOPY (EGD) WITH PROPOFOL N/A 08/27/2017   Procedure: ESOPHAGOGASTRODUODENOSCOPY (EGD) WITH PROPOFOL;  Surgeon: Manya Silvas, MD;  Location: [facility] ENDOSCOPY;  Service: Endoscopy;  Laterality:  N/A;  . HAND SURGERY     repair of left metatarsal   . HARDWARE REMOVAL Right 02/16/2017   Procedure: HARDWARE REMOVAL FROM HIP;  Surgeon: Hessie Knows, MD;  Location: [facility] ORS;  Service: Orthopedics;  Laterality: Right;  . JOINT REPLACEMENT    . OTHER SURGICAL HISTORY     left 3rd metatarsal fracture repair 2011   . REVISION TOTAL HIP ARTHROPLASTY  06/2009   replacement 2010 then screw and plate removal 8182; right hip  . TONSILLECTOMY    . TONSILLECTOMY    . wrist  ligament repair bilateral    . WRIST RECONSTRUCTION    . WRIST SURGERY     laceration of right wrist ligaments  . wrist surgery     laceration of left wrist surgery     Prior to Admission medications   Medication Sig Start Date End Date Taking? Authorizing Provider  albuterol (VENTOLIN HFA) 108 (90 Base) MCG/ACT inhaler Inhale 1-2 puffs into the lungs every 4 (four) hours as needed for wheezing or shortness of breath. 10/06/18   McLean-Scocuzza, Nino Glow, MD  ARIPiprazole (ABILIFY) 20 MG tablet Take 20 mg by mouth daily.    [provider]  Ascorbic Acid (VITAMIN C) 500 MG CHEW Chew 2,500 mg by mouth daily.    [provider]  aspirin 325 MG tablet Take 1 tablet (325 mg total) by mouth daily. 02/18/17   Duanne Guess, PA-C  Biotin 10 MG CAPS Take by mouth.    [provider]  Biotin 5000 MCG SUBL Place 10,000 mcg under the tongue daily.    [provider]  budesonide-formoterol (SYMBICORT) 160-4.5 MCG/ACT inhaler Symbicort 160 mcg-4.5 mcg/actuation HFA aerosol inhaler    [provider]  calcipotriene (DOVONOX) 0.005 % ointment calcipotriene 0.005 % topical ointment  APPLY A THIN LAYER TO THE AFFECTED AREA(S) BY TOPICAL ROUTE ONCE DAILY ; RUB IN GENTLY AND COMPLETELY    [provider]  Cholecalciferol (VITAMIN D3) 2000 units capsule Vitamin D3 2,000 unit capsule    [provider]  dapsone 25 MG tablet Take by mouth daily.     [provider]   dexmethylphenidate (FOCALIN XR) 20 MG 24 hr capsule dexmethylphenidate ER 20 mg capsule,extended release biphasic50-50    [provider]  diclofenac sodium (VOLTAREN) 1 % GEL Apply 2 g topically 4 (four) times daily as needed (for knee & finger pain.).     [provider]  dicyclomine (BENTYL) 10 MG capsule dicyclomine 10 mg capsule  every 8hours as needed    [provider]  doxycycline (VIBRA-TABS) 100 MG tablet Take 1 tablet (100 mg total) by mouth 2 (two) times daily. With food 03/15/19   McLean-Scocuzza, Nino Glow, MD  DULoxetine (CYMBALTA) 60 MG capsule Take 60 mg by mouth 2 (two) times daily.    [provider]  Elastic Bandages & Supports (MEDICAL COMPRESSION STOCKINGS) MISC Please provide 64mmHg compression stockings 02/09/19   Harvest Dark, MD  etodolac (LODINE) 500 MG tablet etodolac 500 mg  tablet    [provider]  fentaNYL (DURAGESIC - DOSED MCG/HR) 75 MCG/HR Place 75 mcg onto the skin every 3 (three) days.    [provider]  fluocinonide ointment (LIDEX) 0.05 % fluocinonide 0.05 % topical ointment  APPLY TO THE AFFECTED AREA(S) BY TOPICAL ROUTE 2 TIMES PER DAY    [provider]  folic acid (FOLVITE) 1 MG tablet Take 1 tablet (1 mg total) by mouth daily. 07/06/18   McLean-Scocuzza, Nino Glow, MD  furosemide (LASIX) 20 MG tablet Take 20 mg by mouth daily as needed.  02/08/19 02/08/20  [provider]  hydrocortisone (PROCTOZONE-HC) 2.5 % rectal cream Proctozone-HC 2.5 % topical cream perineal applicator    [provider]  hydroxychloroquine (PLAQUENIL) 200 MG tablet Plaquenil 200 mg tablet  Take 1 tablet twice a day by oral route after meals for 90 days.    [provider]  ibuprofen (ADVIL,MOTRIN) 800 MG tablet Take 1 tablet (800 mg total) by mouth every 8 (eight) hours as needed. 08/04/17   Laban Emperor, PA-C  imiquimod (ALDARA) 5 % cream imiquimod 5 % topical cream packet  APPLY TO THE  AFFECTED AREA(S) BY TOPICAL ROUTE 3 TIMES PER WEEK at bedtime    [provider]  isosorbide mononitrate (IMDUR) 60 MG 24 hr tablet Take 1 tablet (60 mg total) by mouth daily. 05/26/18   Carrie Mew, MD  levothyroxine (SYNTHROID, LEVOTHROID) 25 MCG tablet Take 1 tablet (25 mcg total) by mouth daily before breakfast. 10/06/18   McLean-Scocuzza, Nino Glow, MD  LORazepam (ATIVAN) 2 MG tablet Take 1-2 mg by mouth See admin instructions. 1 MG DAILY AS NEEDED FOR ANXIETY & SCHEDULED AT BEDTIME EACH NIGHT    [provider]  meclizine (ANTIVERT) 25 MG tablet Take 1 tablet (25 mg total) by mouth 3 (three) times daily as needed for dizziness or nausea. 12/26/18   [name], MD  methylphenidate 18 MG PO CR tablet Take by mouth.     [provider]  montelukast (SINGULAIR) 10 MG tablet montelukast 10 mg tablet 1 pill qhs 12/30/18   McLean-Scocuzza, Nino Glow, MD  Multiple Vitamins-Minerals (MULTI-VITAMIN GUMMIES PO) Multi Vitamin  GUMMIES    [provider]  omeprazole (PRILOSEC) 40 MG capsule Take 40 mg by mouth 2 (two) times daily.    [provider]  ondansetron (ZOFRAN) 4 MG tablet Zofran 8 mg tablet  Take 1 tablet every 8 hours by oral route as needed.    [provider]  pantoprazole (PROTONIX) 40 MG tablet Take one tablet by mouth twice daily Patient taking differently: TAKE 1 TABLET (40 MG) BY MOUTH ONCE DAILY IN THE MORNING. 12/18/13   Marletta Lor, MD  PARoxetine (PAXIL) 10 MG tablet Take 1 tablet (10 mg total) by mouth at bedtime. 07/01/18   Rubie Maid, MD  polyethylene glycol powder (GLYCOLAX/MIRALAX) powder Take 17 g by mouth daily. 03/29/18   Harvest Dark, MD  Polyvinyl Alcohol-Povidone (REFRESH OP) Place 1 drop into both eyes 2 (two) times daily.    [provider]  predniSONE (DELTASONE) 10 MG tablet prednisone 10 mg tablet  1 daily    [provider]  Probiotic Product (PROBIOTIC PO) Probiotic 10 billion  cell capsule  daily    [provider]  silver sulfADIAZINE (SILVADENE) 1 % cream silver sulfadiazine 1 % topical cream    [provider]  Sodium Chloride 3 % AERS Saline Nasal Mist 3 %  spray nose deeply every  hour or two and blow nose    [provider]  sucralfate (CARAFATE) 1 g tablet sucralfate 1 gram tablet    [provider]  topiramate (TOPAMAX) 100 MG tablet topiramate 100 mg tablet  TAKE 1 TABLET BY MOUTH 2 TIMES DAILY.    [provider]  valACYclovir (VALTREX) 500 MG tablet valacyclovir 500 mg tablet  1 daily    [provider]    Allergies Bee venom; Fish allergy; Latex; Prasterone; Shellfish allergy; Shellfish-derived products; Compazine [prochlorperazine edisylate]; Dhea [nutritional supplements]; Dilaudid [hydromorphone hcl]; Lactose; Lactose intolerance (gi); Other; Prochlorperazine; Promethazine; Promethazine hcl; Reclast [zoledronic acid]; Clarithromycin; Hydromorphone hcl; and Penicillins  Family History  Problem Relation Age of Onset  . Hypertension Mother   . Arthritis Mother   . Heart disease Mother        afib  . Asthma Sister   . Asthma Daughter   . Arthritis Daughter   . Heart disease Daughter        ?heart condition on BB  . ADD / ADHD Daughter   . Pancreatic cancer Father   . Cancer Father        pancreatitic   . Diabetes Maternal Grandmother   . Heart disease Maternal Grandmother   . Arthritis Maternal Grandmother   . Hypertension Maternal Grandmother   . Colon cancer Maternal Uncle 62  . Crohn's disease Maternal Aunt   . Diabetes Maternal Aunt   . Breast cancer Cousin 9       maternal  . Cancer Cousin        m cousin breat cancer s/p removal both breasts     Social History Social History   Tobacco Use  . Smoking status: Never Smoker  . Smokeless tobacco: Never Used  Substance Use Topics  . Alcohol use: No  . Drug use: No    Review of Systems  Constitutional: Negative for  fever. Eyes: Negative for visual changes. ENT: Negative for sore throat. Cardiovascular: Negative for chest pain. Respiratory: Negative for shortness of breath. Gastrointestinal: Negative for abdominal pain, vomiting and diarrhea. Genitourinary: Negative for dysuria. Musculoskeletal: Negative for back pain. Skin: Negative for rash. Neurological: Negative for headaches, focal weakness or numbness. ____________________________________________  PHYSICAL EXAM:  VITAL SIGNS: ED Triage Vitals  Enc Vitals Group     BP 04/07/19 1146 (!) 126/48     Pulse Rate 04/07/19 1146 90     Resp 04/07/19 1146 18     Temp 04/07/19 1146 99.1 F (37.3 C)     Temp Source 04/07/19 1146 Oral     SpO2 04/07/19 1146 95 %     Weight 04/07/19 1154 227 lb (103 kg)     Height 04/07/19 1154 5\' 10"  (1.778 m)     Head Circumference --      Peak Flow --      Pain Score 04/07/19 1153 6     Pain Loc --      Pain Edu? --      Excl. in [place]? --     Constitutional: Alert and oriented. Well appearing and in no distress. Head: Normocephalic and atraumatic. Eyes: Conjunctivae are normal. Normal extraocular movements Cardiovascular: Normal rate, regular rhythm. Normal distal pulses. Bilateral LE 2+ pitting edema from the feet to the knees.  Respiratory: Normal respiratory effort. No wheezes/rales/rhonchi. Gastrointestinal: Soft and nontender. No distention. Musculoskeletal: Nontender with normal range of motion in all extremities.  Neurologic:  Essential tremor noted. Normal speech and language. No gross focal neurologic deficits  are appreciated. Skin:  Skin is warm, dry and intact. No rash noted. No wounds, scars, lymphangitis, or skin weeping.  Psychiatric: Mood and affect are normal. Patient exhibits appropriate insight and judgment. ____________________________________________   LABS (pertinent positives/negatives)  Labs Reviewed  COMPREHENSIVE METABOLIC PANEL - Abnormal; Notable for the following components:       Result Value   Glucose, Bld 113 (*)    Calcium 8.6 (*)    All other components within normal limits  CBC - Abnormal; Notable for the following components:   RBC 3.75 (*)    Hemoglobin 10.7 (*)    HCT 34.5 (*)    Platelets 144 (*)    All other components within normal limits  URINALYSIS, COMPLETE (UACMP) WITH MICROSCOPIC - Abnormal; Notable for the following components:   Color, Urine STRAW (*)    APPearance CLEAR (*)    Specific Gravity, Urine 1.003 (*)    All other components within normal limits  LIPASE, BLOOD  BRAIN NATRIURETIC PEPTIDE  ____________________________________________   RADIOLOGY  CXR  IMPRESSION: Irregular biapical peripheral opacities, potentially reflecting pleuroparenchymal scarring, but new since 10/11/2017. apical infection cannot be excluded. CT imaging could be used to further evaluate as clinically warranted. ____________________________________________  PROCEDURES  Procedures Furosemide 40 mg IVP ____________________________________________  INITIAL IMPRESSION / ASSESSMENT AND PLAN / ED COURSE  [name] [name] was evaluated in Emergency Department on 04/07/2019 for the symptoms described in the history of present illness. She was evaluated in the context of the global COVID-19 pandemic, which necessitated consideration that the patient might be at risk for infection with the SARS-CoV-2 virus that causes COVID-19. Institutional protocols and algorithms that pertain to the evaluation of patients at risk for COVID-19 are in a state of rapid change based on information released by regulatory bodies including the CDC and federal and state organizations. These policies and algorithms were followed during the patient's care in the ED.  Patient with ED evaluation of chronic bilateral lower extremity edema.  She has been evaluated in the past by her specialist and her primary provider.  She has been on Lasix at 40 mg daily, but took medicine for 3  days.  Her exam, labs, test are overall benign and reassuring at this time.  Patient was made aware of need for probable outpatient nonemergent CT to evaluate some possible scarring.  She was evaluated here in the ED and given an IV dose of furosemide.  She is encouraged to double the dose of her Lasix over the next 2 days, and follow with primary provider.  Return precautions have been reviewed. ____________________________________________  FINAL CLINICAL IMPRESSION(S) / ED DIAGNOSES  Final diagnoses:  Bilateral lower extremity edema      Mcgwire Dasaro, Dannielle Karvonen, PA-C 04/07/19 1456    Merlyn Lot, MD 04/07/19 [phone]

## 2019-04-07 NOTE — ED Triage Notes (Addendum)
Patient presents to the ED with bilateral leg swelling.  Patient states swelling is particularly in her knees today.  Patient states her knees are sore.  Patient states leg swelling began a few weeks ago.  Patient was seen in the ED and compression stockings were recommended.  Patient states her PCP prescribed furosemide.  Patient states the furosemide did not help and she stopped taking it after 3 days per MD recommendation.  Patient denies high salt diet.  Patient also reports right upper abdominal pain for the same amount of time as the leg swelling.

## 2019-04-08 ENCOUNTER — Encounter: Payer: Self-pay | Admitting: Emergency Medicine

## 2019-04-08 ENCOUNTER — Other Ambulatory Visit: Payer: Self-pay

## 2019-04-08 ENCOUNTER — Emergency Department
Admission: EM | Admit: 2019-04-08 | Discharge: 2019-04-08 | Disposition: A | Discharge status: Home / Self Care | Payer: Medicare Other | Attending: Emergency Medicine | Admitting: Emergency Medicine

## 2019-04-08 DIAGNOSIS — F909 Attention-deficit hyperactivity disorder, unspecified type: Secondary | ICD-10-CM | POA: Diagnosis not present

## 2019-04-08 DIAGNOSIS — E860 Dehydration: Secondary | ICD-10-CM | POA: Insufficient documentation

## 2019-04-08 DIAGNOSIS — R531 Weakness: Secondary | ICD-10-CM | POA: Diagnosis present

## 2019-04-08 DIAGNOSIS — Z7982 Long term (current) use of aspirin: Secondary | ICD-10-CM | POA: Diagnosis not present

## 2019-04-08 DIAGNOSIS — Z91040 Latex allergy status: Secondary | ICD-10-CM | POA: Diagnosis not present

## 2019-04-08 DIAGNOSIS — Z96641 Presence of right artificial hip joint: Secondary | ICD-10-CM | POA: Diagnosis not present

## 2019-04-08 DIAGNOSIS — F329 Major depressive disorder, single episode, unspecified: Secondary | ICD-10-CM | POA: Insufficient documentation

## 2019-04-08 DIAGNOSIS — J45909 Unspecified asthma, uncomplicated: Secondary | ICD-10-CM | POA: Insufficient documentation

## 2019-04-08 DIAGNOSIS — Z9049 Acquired absence of other specified parts of digestive tract: Secondary | ICD-10-CM | POA: Insufficient documentation

## 2019-04-08 DIAGNOSIS — F419 Anxiety disorder, unspecified: Secondary | ICD-10-CM | POA: Insufficient documentation

## 2019-04-08 DIAGNOSIS — Z79899 Other long term (current) drug therapy: Secondary | ICD-10-CM | POA: Diagnosis not present

## 2019-04-08 DIAGNOSIS — E039 Hypothyroidism, unspecified: Secondary | ICD-10-CM | POA: Diagnosis not present

## 2019-04-08 LAB — BASIC METABOLIC PANEL
Anion gap: 10 (ref 5–15)
BUN: 15 mg/dL (ref 6–20)
CO2: 28 mmol/L (ref 22–32)
Calcium: 9.3 mg/dL (ref 8.9–10.3)
Chloride: 105 mmol/L (ref 98–111)
Creatinine, Ser: 1.01 mg/dL — ABNORMAL HIGH (ref 0.44–1.00)
GFR calc Af Amer: >60 mL/min (ref >60)
GFR calc non Af Amer: >60 mL/min (ref >60)
Glucose, Bld: 110 mg/dL — ABNORMAL HIGH (ref 70–99)
Potassium: 3.7 mmol/L (ref 3.5–5.1)
Sodium: 143 mmol/L (ref 135–145)

## 2019-04-08 LAB — CBC
HCT: 41.6 % (ref 36.0–46.0)
Hemoglobin: 13.1 g/dL (ref 12.0–15.0)
MCH: 28.3 pg (ref 26.0–34.0)
MCHC: 31.5 g/dL (ref 30.0–36.0)
MCV: 89.8 fL (ref 80.0–100.0)
Platelets: 178 10*3/uL (ref 150–400)
RBC: 4.63 MIL/uL (ref 3.87–5.11)
RDW: 13.6 % (ref 11.5–15.5)
WBC: 4.1 10*3/uL (ref 4.0–10.5)
nRBC: 0 % (ref 0.0–0.2)

## 2019-04-08 LAB — URINALYSIS, COMPLETE (UACMP) WITH MICROSCOPIC
Bacteria, UA: NONE SEEN
Bilirubin Urine: NEGATIVE
Glucose, UA: NEGATIVE mg/dL
Hgb urine dipstick: NEGATIVE
Ketones, ur: NEGATIVE mg/dL
Leukocytes,Ua: NEGATIVE
Nitrite: NEGATIVE
Protein, ur: NEGATIVE mg/dL
Specific Gravity, Urine: 1.006 (ref 1.005–1.030)
pH: 6 (ref 5.0–8.0)

## 2019-04-08 LAB — GLUCOSE, CAPILLARY: Glucose-Capillary: 132 mg/dL — ABNORMAL HIGH (ref 70–99)

## 2019-04-08 LAB — TROPONIN I: Troponin I: <0.03 ng/mL (ref <0.03)

## 2019-04-08 MED ORDER — SODIUM CHLORIDE 0.9 % IV BOLUS
500.0000 mL | Freq: Once | INTRAVENOUS | Status: AC
  Administered 2019-04-08: 500 mL via INTRAVENOUS

## 2019-04-08 MED ORDER — SODIUM CHLORIDE 0.9% FLUSH: 3.0000 mL | Freq: Once | INTRAVENOUS | Status: DC

## 2019-04-08 NOTE — ED Notes (Signed)
Attempted for IV x2. Will have 2nd RN try.

## 2019-04-08 NOTE — ED Provider Notes (Signed)
[facility] Emergency Department Provider Note  ____________________________________________   I have reviewed the triage vital signs and the nursing notes.   HISTORY  Chief Complaint Weakness and Dizziness   History limited by: Not Limited   HPI [name] [name] is a 53 y.o. female who presents to the emergency department today because of concern for weakness and lightheadedness. The patient was seen in the emergency department yesterday for peripheral edema. Was given lasix. She states that today she started feeling like she was going to pass out. The patient denies any associated chest pain or palpitations. She denies any fevers.    Records reviewed. Per medical record review patient has a history of ER visit for dizziness in the past.   Past Medical History:  Diagnosis Date  . ADHD   . Anemia   . ANXIETY 06/06/2007  . Arthritis    related to lupus  . Asthma   . BACK PAIN 02/22/2009  . Cervical cancer ([place])    LEEP 2005   . Chicken pox   . Chronic pain    goes to pain management provider  . Collagen vascular disease ([place])   . Connective tissue disease ([place])   . DEPRESSION 06/06/2007  . Essential tremor   . Fibromyalgia   . Fibromyalgia   . FREQUENCY, URINARY 07/07/2007  . Gastritis   . GERD 06/06/2007  . Glaucoma   . Headache    h/o migraines   . History of hiatal hernia   . History of kidney stones   . HYPOTHYROIDISM 06/06/2007  . INTERSTITIAL CYSTITIS 09/11/2010  . Lupus ([place]) 2006  . Lupus ([place])   . Menopause   . Migraine   . Mitral valve regurgitation   . NEPHROLITHIASIS, HX OF 11/05/2010  . Neuropathy   . Neuropathy   . OCD (obsessive compulsive disorder)   . Optic neuritis    2005  . OSTEOPENIA 10/14/2009  . Osteoporosis   . OVERACTIVE BLADDER 10/14/2009  . PAIN, CHRONIC NEC 06/06/2007  . Panniculitis   . POLYARTHRITIS 08/19/2007  . Raynaud's disease   . Sicca syndrome ([place])   . 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  Vaginal prolapse    Dr. Marcelline Mates [facility]   . Vasculitis ([place])   . WEIGHT GAIN 02/22/2009    Patient Active Problem List   Diagnosis Date Noted  . Connective tissue disorder ([place]) 02/14/2019  . RUQ abdominal pain 02/14/2019  . Bilateral leg edema 02/09/2019  . Hiatal hernia 10/06/2018  . ADHD 08/04/2018  . Lupus arthritis ([place]) 08/04/2018  . Asthma 08/04/2018  . Systemic lupus erythematosus ([place]) 08/04/2018  . Essential tremor 08/04/2018  . Urinary incontinence 08/04/2018  . Glaucoma 08/04/2018  . Migraines 08/04/2018  . Mitral valve regurgitation 08/04/2018  . Neuropathy 08/04/2018  . OCD (obsessive compulsive disorder) 08/04/2018  . Osteopenia 08/04/2018  . Thrombocytopenia ([place]) 07/06/2018  . Chest pain 07/04/2018  . Allergic rhinitis 12/16/2017  . Dizziness 12/16/2017  . Orthostatic hypotension 12/16/2017  . Generalized abdominal pain 12/16/2017  . Constipation 12/16/2017  . Acute nonintractable headache 12/13/2017  . Fibromyalgia 11/04/2017  . Status post hardware removal 02/16/2017  . Sicca syndrome ([place]) 06/04/2014  . Abdominal pain, epigastric 09/08/2013  . Chronic pain 03/31/2011  . NEPHROLITHIASIS, HX OF 11/05/2010  . INTERSTITIAL CYSTITIS 09/11/2010  . Overactive bladder 10/14/2009  . Disorder of bone and cartilage 10/14/2009  . Backache 02/22/2009  . WEIGHT GAIN 02/22/2009  . 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 Polyarthropathy or polyarthritis of  multiple sites 08/19/2007  . FREQUENCY, URINARY 07/07/2007  . Hypothyroidism 06/06/2007  . Anxiety state 06/06/2007  . Depression, recurrent ([place]) 06/06/2007  . PAIN, CHRONIC NEC 06/06/2007  . GERD 06/06/2007    Past Surgical History:  Procedure Laterality Date  . APPENDECTOMY    . CERVICAL BIOPSY  W/ LOOP ELECTRODE EXCISION  2005  . CHOLECYSTECTOMY    . ESOPHAGOGASTRODUODENOSCOPY (EGD) WITH PROPOFOL N/A 08/27/2017   Procedure: ESOPHAGOGASTRODUODENOSCOPY (EGD) WITH PROPOFOL;  Surgeon: Manya Silvas, MD;  Location: [facility] ENDOSCOPY;  Service: Endoscopy;  Laterality: N/A;  . HAND SURGERY     repair of left metatarsal   . HARDWARE REMOVAL Right 02/16/2017   Procedure: HARDWARE REMOVAL FROM HIP;  Surgeon: Hessie Knows, MD;  Location: [facility] ORS;  Service: Orthopedics;  Laterality: Right;  . JOINT REPLACEMENT    . OTHER SURGICAL HISTORY     left 3rd metatarsal fracture repair 2011   . REVISION TOTAL HIP ARTHROPLASTY  06/2009   replacement 2010 then screw and plate removal 5053; right hip  . TONSILLECTOMY    . TONSILLECTOMY    . wrist  ligament repair bilateral    . WRIST RECONSTRUCTION    . WRIST SURGERY     laceration of right wrist ligaments  . wrist surgery     laceration of left wrist surgery     Prior to Admission medications   Medication Sig Start Date End Date Taking? Authorizing Provider  albuterol (VENTOLIN HFA) 108 (90 Base) MCG/ACT inhaler Inhale 1-2 puffs into the lungs every 4 (four) hours as needed for wheezing or shortness of breath. 10/06/18   McLean-Scocuzza, Nino Glow, MD  ARIPiprazole (ABILIFY) 20 MG tablet Take 20 mg by mouth daily.    [provider]  Ascorbic Acid (VITAMIN C) 500 MG CHEW Chew 2,500 mg by mouth daily.    [provider]  aspirin 325 MG tablet Take 1 tablet (325 mg total) by mouth daily. 02/18/17   Duanne Guess, PA-C  Biotin 10 MG CAPS Take by mouth.    [provider]  Biotin 5000 MCG SUBL Place 10,000 mcg under the tongue daily.    [provider]  budesonide-formoterol (SYMBICORT) 160-4.5 MCG/ACT inhaler Symbicort 160 mcg-4.5 mcg/actuation HFA aerosol inhaler    [provider]  calcipotriene (DOVONOX) 0.005 % ointment calcipotriene 0.005 % topical ointment  APPLY A THIN LAYER TO THE AFFECTED AREA(S) BY TOPICAL ROUTE ONCE DAILY ; RUB IN GENTLY AND COMPLETELY    [provider]  Cholecalciferol (VITAMIN D3) 2000 units capsule Vitamin D3 2,000 unit capsule    [provider]  dapsone 25  MG tablet Take by mouth daily.     [provider]  dexmethylphenidate (FOCALIN XR) 20 MG 24 hr capsule dexmethylphenidate ER 20 mg capsule,extended release biphasic50-50    [provider]  diclofenac sodium (VOLTAREN) 1 % GEL Apply 2 g topically 4 (four) times daily as needed (for knee & finger pain.).     [provider]  dicyclomine (BENTYL) 10 MG capsule dicyclomine 10 mg capsule  every 8hours as needed    [provider]  doxycycline (VIBRA-TABS) 100 MG tablet Take 1 tablet (100 mg total) by mouth 2 (two) times daily. With food 03/15/19   McLean-Scocuzza, Nino Glow, MD  DULoxetine (CYMBALTA) 60 MG capsule Take 60 mg by mouth 2 (two) times daily.    [provider]  Elastic Bandages & Supports (MEDICAL COMPRESSION STOCKINGS) MISC Please provide 85mmHg compression stockings 02/09/19  Harvest Dark, MD  etodolac (LODINE) 500 MG tablet etodolac 500 mg tablet    [provider]  fentaNYL (DURAGESIC - DOSED MCG/HR) 75 MCG/HR Place 75 mcg onto the skin every 3 (three) days.    [provider]  fluocinonide ointment (LIDEX) 0.05 % fluocinonide 0.05 % topical ointment  APPLY TO THE AFFECTED AREA(S) BY TOPICAL ROUTE 2 TIMES PER DAY    [provider]  folic acid (FOLVITE) 1 MG tablet Take 1 tablet (1 mg total) by mouth daily. 07/06/18   McLean-Scocuzza, Nino Glow, MD  furosemide (LASIX) 20 MG tablet Take 20 mg by mouth daily as needed.  02/08/19 02/08/20  [provider]  hydrocortisone (PROCTOZONE-HC) 2.5 % rectal cream Proctozone-HC 2.5 % topical cream perineal applicator    [provider]  hydroxychloroquine (PLAQUENIL) 200 MG tablet Plaquenil 200 mg tablet  Take 1 tablet twice a day by oral route after meals for 90 days.    [provider]  ibuprofen (ADVIL,MOTRIN) 800 MG tablet Take 1 tablet (800 mg total) by mouth every 8 (eight) hours as needed. 08/04/17   Laban Emperor, PA-C  imiquimod (ALDARA) 5 %  cream imiquimod 5 % topical cream packet  APPLY TO THE AFFECTED AREA(S) BY TOPICAL ROUTE 3 TIMES PER WEEK at bedtime    [provider]  isosorbide mononitrate (IMDUR) 60 MG 24 hr tablet Take 1 tablet (60 mg total) by mouth daily. 05/26/18   Carrie Mew, MD  levothyroxine (SYNTHROID, LEVOTHROID) 25 MCG tablet Take 1 tablet (25 mcg total) by mouth daily before breakfast. 10/06/18   McLean-Scocuzza, Nino Glow, MD  LORazepam (ATIVAN) 2 MG tablet Take 1-2 mg by mouth See admin instructions. 1 MG DAILY AS NEEDED FOR ANXIETY & SCHEDULED AT BEDTIME EACH NIGHT    [provider]  meclizine (ANTIVERT) 25 MG tablet Take 1 tablet (25 mg total) by mouth 3 (three) times daily as needed for dizziness or nausea. 12/26/18   [name], MD  methylphenidate 18 MG PO CR tablet Take by mouth.     [provider]  montelukast (SINGULAIR) 10 MG tablet montelukast 10 mg tablet 1 pill qhs 12/30/18   McLean-Scocuzza, Nino Glow, MD  Multiple Vitamins-Minerals (MULTI-VITAMIN GUMMIES PO) Multi Vitamin  GUMMIES    [provider]  omeprazole (PRILOSEC) 40 MG capsule Take 40 mg by mouth 2 (two) times daily.    [provider]  ondansetron (ZOFRAN) 4 MG tablet Zofran 8 mg tablet  Take 1 tablet every 8 hours by oral route as needed.    [provider]  pantoprazole (PROTONIX) 40 MG tablet Take one tablet by mouth twice daily Patient taking differently: TAKE 1 TABLET (40 MG) BY MOUTH ONCE DAILY IN THE MORNING. 12/18/13   Marletta Lor, MD  PARoxetine (PAXIL) 10 MG tablet Take 1 tablet (10 mg total) by mouth at bedtime. 07/01/18   Rubie Maid, MD  polyethylene glycol powder (GLYCOLAX/MIRALAX) powder Take 17 g by mouth daily. 03/29/18   Harvest Dark, MD  Polyvinyl Alcohol-Povidone (REFRESH OP) Place 1 drop into both eyes 2 (two) times daily.    [provider]  predniSONE (DELTASONE) 10 MG tablet prednisone 10 mg tablet  1 daily    [provider]   Probiotic Product (PROBIOTIC PO) Probiotic 10 billion cell capsule  daily    [provider]  silver sulfADIAZINE (SILVADENE) 1 % cream silver sulfadiazine 1 % topical cream    [provider]  Sodium Chloride 3 %  AERS Saline Nasal Mist 3 %  spray nose deeply every hour or two and blow nose    [provider]  sucralfate (CARAFATE) 1 g tablet sucralfate 1 gram tablet    [provider]  topiramate (TOPAMAX) 100 MG tablet topiramate 100 mg tablet  TAKE 1 TABLET BY MOUTH 2 TIMES DAILY.    [provider]  valACYclovir (VALTREX) 500 MG tablet valacyclovir 500 mg tablet  1 daily    [provider]    Allergies Bee venom; Fish allergy; Latex; Prasterone; Shellfish allergy; Shellfish-derived products; Compazine [prochlorperazine edisylate]; Dhea [nutritional supplements]; Dilaudid [hydromorphone hcl]; Lactose; Lactose intolerance (gi); Other; Prochlorperazine; Promethazine; Promethazine hcl; Reclast [zoledronic acid]; Clarithromycin; Hydromorphone hcl; and Penicillins  Family History  Problem Relation Age of Onset  . Hypertension Mother   . Arthritis Mother   . Heart disease Mother        afib  . Asthma Sister   . Asthma Daughter   . Arthritis Daughter   . Heart disease Daughter        ?heart condition on BB  . ADD / ADHD Daughter   . Pancreatic cancer Father   . Cancer Father        pancreatitic   . Diabetes Maternal Grandmother   . Heart disease Maternal Grandmother   . Arthritis Maternal Grandmother   . Hypertension Maternal Grandmother   . Colon cancer Maternal Uncle 16  . Crohn's disease Maternal Aunt   . Diabetes Maternal Aunt   . Breast cancer Cousin 36       maternal  . Cancer Cousin        m cousin breat cancer s/p removal both breasts     Social History Social History   Tobacco Use  . Smoking status: Never Smoker  . Smokeless tobacco: Never Used  Substance Use Topics  . Alcohol use: No  . Drug use: No     Review of Systems Constitutional: No fever/chills Eyes: No visual changes. ENT: No sore throat. Cardiovascular: Denies chest pain. Respiratory: Denies shortness of breath. Gastrointestinal: No abdominal pain.  No nausea, no vomiting.  No diarrhea.   Genitourinary: Negative for dysuria. Musculoskeletal: Negative for back pain. Skin: Negative for rash. Neurological: Negative for headaches, focal weakness or numbness. Positive for lightheadedness.   ____________________________________________   PHYSICAL EXAM:  VITAL SIGNS: ED Triage Vitals  Enc Vitals Group     BP 04/08/19 1445 140/75     Pulse Rate 04/08/19 1445 (!) 104     Resp 04/08/19 1445 16     Temp 04/08/19 1445 98.9 F (37.2 C)     Temp Source 04/08/19 1445 Oral     SpO2 04/08/19 1445 94 %     Weight 04/08/19 1443 224 lb 13.9 oz (102 kg)     Height 04/08/19 1443 5\' 10"  (1.778 m)     Head Circumference --      Peak Flow --      Pain Score 04/08/19 1443 6    Constitutional: Alert and oriented.  Eyes: Conjunctivae are normal.  ENT      Head: Normocephalic and atraumatic.      Nose: No congestion/rhinnorhea.      Mouth/Throat: Mucous membranes are moist.      Neck: No stridor. Hematological/Lymphatic/Immunilogical: No cervical lymphadenopathy. Cardiovascular: Normal rate, regular rhythm.  No murmurs, rubs, or gallops.  Respiratory: Normal respiratory effort without tachypnea nor retractions. Breath sounds are clear and equal bilaterally. No wheezes/rales/rhonchi. Gastrointestinal: Soft and non tender.  No rebound. No guarding.  Genitourinary: Deferred Musculoskeletal: Normal range of motion in all extremities. No lower extremity edema. Neurologic:  Normal speech and language. No gross focal neurologic deficits are appreciated.  Skin:  Skin is warm, dry and intact. No rash noted. Psychiatric: Mood and affect are normal. Speech and behavior are normal. Patient exhibits appropriate insight and  judgment.  ____________________________________________    LABS (pertinent positives/negatives)  UA clear, unremarkable BMP wnl except glu 110, cr 1.01 CBC wbc 4.1, hgb 13.1, plt 178 Trop <0.03  ____________________________________________   EKG  I, Nance Pear, attending physician, personally viewed and interpreted this EKG  EKG Time: 1452 Rate: 94 Rhythm: normal sinus rhythm Axis: normal Intervals: qtc 475 QRS: narrow ST changes: no st elevation Impression: normal ekg  ____________________________________________    RADIOLOGY  None  ____________________________________________   PROCEDURES  Procedures  ____________________________________________   INITIAL IMPRESSION / ASSESSMENT AND PLAN / ED COURSE  Pertinent labs & imaging results that were available during my care of the patient were reviewed by me and considered in my medical decision making (see chart for details).   Patient presented to the emergency department today because of concerns for weakness and lightheadedness.  She was seen in the emergency department yesterday and given Lasix for peripheral edema.  Blood sugar showed a very minimally elevated creatinine.  Patient did feel better after IV fluids.  This point do wonder if some of the weakness was due to dehydration.  Will plan on discharging.  ____________________________________________   FINAL CLINICAL IMPRESSION(S) / ED DIAGNOSES  Final diagnoses:  Weakness  Dehydration     Note: This dictation was prepared with Dragon dictation. Any transcriptional errors that result from this process are unintentional     Nance Pear, MD 04/08/19 2251

## 2019-04-08 NOTE — Discharge Instructions (Addendum)
Please seek medical attention for any high fevers, chest pain, shortness of breath, change in behavior, persistent vomiting, bloody stool or any other new or concerning symptoms.  

## 2019-04-08 NOTE — ED Notes (Signed)
Pt reports getting lasix yesterday and being told to take 80mg  when getting home. Pt reports that she feels very dry and has not been able to urinate except she has urinated x3 today. Edema in both legs resent 1+. Pt has good pedal pulses. Pt says she thinks "my brain got rid of the fluid instead of my body" and says that she feels dizy eeven just sitting still.

## 2019-04-08 NOTE — ED Triage Notes (Signed)
Pt to ED via POV c/o dizziness and weakness x 1 hour. Pt states that she was seen yesterday and given Lasix IV. Pt is in NAD at this time.

## 2019-04-08 NOTE — ED Notes (Signed)
Pt updated. Bed in lowest position. Rails up. Pt has blankets.

## 2019-04-08 NOTE — ED Notes (Signed)
Pt given phone to call ride 

## 2019-04-10 ENCOUNTER — Telehealth: Payer: Self-pay | Admitting: Cardiovascular Disease

## 2019-04-10 NOTE — Telephone Encounter (Signed)

## 2019-04-11 ENCOUNTER — Encounter: Payer: Self-pay | Admitting: Internal Medicine

## 2019-04-11 NOTE — Progress Notes (Signed)
Virtual Visit via Video Note   This visit type was conducted due to national recommendations for restrictions regarding the COVID-19 Pandemic (e.g. social distancing) in an effort to limit this patient's exposure and mitigate transmission in our community.  Due to her co-morbid illnesses, this patient is at least at moderate risk for complications without adequate follow up.  This format is felt to be most appropriate for this patient at this time.  All issues noted in this document were discussed and addressed.  A limited physical exam was performed with this format.  Please refer to the patient's chart for her consent to telehealth for [facility].   I connected with  [name] [name] on 04/12/19 by a video enabled telemedicine application and verified that I am speaking with the correct person using two identifiers. I discussed the limitations of evaluation and management by telemedicine. The patient expressed understanding and agreed to proceed.   Evaluation Performed:  Follow-up visit  Date:  04/12/2019   ID:  [name] [name], DOB Mar 05, 1966, MRN [id number]  Patient Location:  [place] [address]   Provider location:   [facility], [place] office  PCP:  McLean-Scocuzza, Nino Glow, MD  Cardiologist:  Patsy Baltimore   Chief Complaint: Lower extremity edema, SOB   History of Present Illness:    [name] [name] is a 53 y.o. female who presents via audio/video conferencing for a telehealth visit today.   The patient does not symptoms concerning for COVID-19 infection (fever, chills, cough, or new SHORTNESS OF BREATH).   Patient has a past medical history of collagen vascular disorder, Undifferentiated connective tissue disease, tremors and lupus. Followed by rheumatology On chronic prednisone Mitral valve disorder   Chronic pain Anemia Chronic tremor Who presents for new patient evaluation for lower extremity edema   Previously [facility] cardiology Phone call February 08, 2019 for peripheral edema, dull chest pain  BNP 63 Was started on Lasix 20 mg daily  Weight going up 228  She presented to the emergency room February 09, 2019 with leg swelling Work-up negative Lower extremity Doppler negative for DVT Started Lasix 20 mg daily  In follow-up she was given doxycycline for 1 week for possible cellulitis Lasix increased up to 40 mg daily for 3 days  Several weeks later seen back in the emergency room Apr 07, 2019 for leg swelling Emergency room notes reported she had not been taking her Lasix on a regular basis She was given Lasix IV then discharged on 80 mg Lasix for 2 days  She returned back to the emergency room the next day with weakness, lightheadedness Given fluids by IV and felt better  Lab work reviewed Hematocrit 10.7, lab error? 13.1 next day Low BNP  Prior CV studies:   The following studies were reviewed today:  Cardiac CTA October 2019 1. Normal origin of the coronary arteries.  2. No evidence for atherosclerotic disease (CAD RADS 0).  3. 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   Nuclear stress test Normal LV function, mild lateral wall ischemia, May 03, 2018   Echocardiogram 2018 NORMAL LEFT VENTRICULAR SYSTOLIC FUNCTION WITH AN ESTIMATED EF = 55 % NORMAL RIGHT VENTRICULAR SYSTOLIC FUNCTION MILD MITRAL VALVE INSUFFICIENCY TRACE TRICUSPID VALVE INSUFFICIENCY NO VALVULAR STENOSIS  Past Medical History:  Diagnosis Date  . ADHD   . Anemia   . ANXIETY 06/06/2007  .  Arthritis    related to lupus  . Asthma   . BACK PAIN 02/22/2009  . Cervical cancer ([place])    LEEP 2005   . Chicken pox   . Chronic pain    goes to pain management provider  . Collagen vascular disease ([place])   . Connective tissue disease ([place])   . DEPRESSION 06/06/2007  . Essential tremor   . Fibromyalgia   . Fibromyalgia   . FREQUENCY,  URINARY 07/07/2007  . Gastritis   . GERD 06/06/2007  . Glaucoma   . Headache    h/o migraines   . History of hiatal hernia   . History of kidney stones   . HYPOTHYROIDISM 06/06/2007  . INTERSTITIAL CYSTITIS 09/11/2010  . Lupus ([place]) 2006  . Lupus ([place])   . Menopause   . Migraine   . Mitral valve regurgitation   . NEPHROLITHIASIS, HX OF 11/05/2010  . Neuropathy   . Neuropathy   . OCD (obsessive compulsive disorder)   . Optic neuritis    2005  . OSTEOPENIA 10/14/2009  . Osteoporosis   . OVERACTIVE BLADDER 10/14/2009  . PAIN, CHRONIC NEC 06/06/2007  . Panniculitis   . POLYARTHRITIS 08/19/2007  . Raynaud's disease   . Sicca syndrome ([place])   . 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 Vaginal prolapse    Dr. Marcelline Mates [facility]   . Vasculitis ([place])   . WEIGHT GAIN 02/22/2009   Past Surgical History:  Procedure Laterality Date  . APPENDECTOMY    . CERVICAL BIOPSY  W/ LOOP ELECTRODE EXCISION  2005  . CHOLECYSTECTOMY    . ESOPHAGOGASTRODUODENOSCOPY (EGD) WITH PROPOFOL N/A 08/27/2017   Procedure: ESOPHAGOGASTRODUODENOSCOPY (EGD) WITH PROPOFOL;  Surgeon: Manya Silvas, MD;  Location: [facility] ENDOSCOPY;  Service: Endoscopy;  Laterality: N/A;  . HAND SURGERY     repair of left metatarsal   . HARDWARE REMOVAL Right 02/16/2017   Procedure: HARDWARE REMOVAL FROM HIP;  Surgeon: Hessie Knows, MD;  Location: [facility] ORS;  Service: Orthopedics;  Laterality: Right;  . JOINT REPLACEMENT    . OTHER SURGICAL HISTORY     left 3rd metatarsal fracture repair 2011   . REVISION TOTAL HIP ARTHROPLASTY  06/2009   replacement 2010 then screw and plate removal 7209; right hip  . TONSILLECTOMY    . TONSILLECTOMY    . wrist  ligament repair bilateral    . WRIST RECONSTRUCTION    . WRIST SURGERY     laceration of right wrist ligaments  . wrist surgery     laceration of left wrist surgery      No outpatient medications have been marked as taking for the 04/12/19 encounter (Telemedicine) with Minna Merritts,  MD.     Allergies:   Bee venom; Fish allergy; Latex; Prasterone; Shellfish allergy; Shellfish-derived products; Compazine [prochlorperazine edisylate]; Dhea [nutritional supplements]; Dilaudid [hydromorphone hcl]; Lactose; Lactose intolerance (gi); Other; Prochlorperazine; Promethazine; Promethazine hcl; Reclast [zoledronic acid]; Clarithromycin; Hydromorphone hcl; and Penicillins   Social History   Tobacco Use  . Smoking status: Never Smoker  . Smokeless tobacco: Never Used  Substance Use Topics  . Alcohol use: No  . Drug use: No     Current Outpatient Medications on File Prior to Visit  Medication Sig Dispense Refill  . albuterol (VENTOLIN HFA) 108 (90 Base) MCG/ACT inhaler Inhale 1-2 puffs into the lungs every 4 (four) hours as needed for wheezing or shortness of breath. 1 Inhaler 12  . ARIPiprazole (ABILIFY) 20 MG tablet Take 20 mg by mouth daily.    Marland Kitchen  Ascorbic Acid (VITAMIN C) 500 MG CHEW Chew 2,500 mg by mouth daily.    Marland Kitchen aspirin 325 MG tablet Take 1 tablet (325 mg total) by mouth daily. 30 tablet 0  . Biotin 10 MG CAPS Take by mouth.    . Biotin 5000 MCG SUBL Place 10,000 mcg under the tongue daily.    . budesonide-formoterol (SYMBICORT) 160-4.5 MCG/ACT inhaler Symbicort 160 mcg-4.5 mcg/actuation HFA aerosol inhaler    . calcipotriene (DOVONOX) 0.005 % ointment calcipotriene 0.005 % topical ointment  APPLY A THIN LAYER TO THE AFFECTED AREA(S) BY TOPICAL ROUTE ONCE DAILY ; RUB IN GENTLY AND COMPLETELY    . Cholecalciferol (VITAMIN D3) 2000 units capsule Vitamin D3 2,000 unit capsule    . dapsone 25 MG tablet Take by mouth daily.     Marland Kitchen dexmethylphenidate (FOCALIN XR) 20 MG 24 hr capsule dexmethylphenidate ER 20 mg capsule,extended release biphasic50-50    . diclofenac sodium (VOLTAREN) 1 % GEL Apply 2 g topically 4 (four) times daily as needed (for knee & finger pain.).     Marland Kitchen dicyclomine (BENTYL) 10 MG capsule dicyclomine 10 mg capsule  every 8hours as needed    . doxycycline  (VIBRA-TABS) 100 MG tablet Take 1 tablet (100 mg total) by mouth 2 (two) times daily. With food 14 tablet 0  . DULoxetine (CYMBALTA) 60 MG capsule Take 60 mg by mouth 2 (two) times daily.    Water engineer Bandages & Supports (MEDICAL COMPRESSION STOCKINGS) MISC Please provide 11mmHg compression stockings 1 each 0  . etodolac (LODINE) 500 MG tablet etodolac 500 mg tablet    . fentaNYL (DURAGESIC - DOSED MCG/HR) 75 MCG/HR Place 75 mcg onto the skin every 3 (three) days.    . fluocinonide ointment (LIDEX) 0.05 % fluocinonide 0.05 % topical ointment  APPLY TO THE AFFECTED AREA(S) BY TOPICAL ROUTE 2 TIMES PER DAY    . folic acid (FOLVITE) 1 MG tablet Take 1 tablet (1 mg total) by mouth daily. 90 tablet 3  . furosemide (LASIX) 20 MG tablet Take 20 mg by mouth daily as needed.     . hydrocortisone (PROCTOZONE-HC) 2.5 % rectal cream Proctozone-HC 2.5 % topical cream perineal applicator    . hydroxychloroquine (PLAQUENIL) 200 MG tablet Plaquenil 200 mg tablet  Take 1 tablet twice a day by oral route after meals for 90 days.    Marland Kitchen ibuprofen (ADVIL,MOTRIN) 800 MG tablet Take 1 tablet (800 mg total) by mouth every 8 (eight) hours as needed. 30 tablet 0  . imiquimod (ALDARA) 5 % cream imiquimod 5 % topical cream packet  APPLY TO THE AFFECTED AREA(S) BY TOPICAL ROUTE 3 TIMES PER WEEK at bedtime    . isosorbide mononitrate (IMDUR) 60 MG 24 hr tablet Take 1 tablet (60 mg total) by mouth daily. 20 tablet 0  . levothyroxine (SYNTHROID, LEVOTHROID) 25 MCG tablet Take 1 tablet (25 mcg total) by mouth daily before breakfast. 90 tablet 3  . LORazepam (ATIVAN) 2 MG tablet Take 1-2 mg by mouth See admin instructions. 1 MG DAILY AS NEEDED FOR ANXIETY & SCHEDULED AT BEDTIME EACH NIGHT    . meclizine (ANTIVERT) 25 MG tablet Take 1 tablet (25 mg total) by mouth 3 (three) times daily as needed for dizziness or nausea. 20 tablet 0  . methylphenidate 18 MG PO CR tablet Take by mouth.     . montelukast (SINGULAIR) 10 MG tablet  montelukast 10 mg tablet 1 pill qhs 90 tablet 3  . Multiple Vitamins-Minerals (MULTI-VITAMIN GUMMIES PO)  Multi Vitamin  GUMMIES    . omeprazole (PRILOSEC) 40 MG capsule Take 40 mg by mouth 2 (two) times daily.    . ondansetron (ZOFRAN) 4 MG tablet Zofran 8 mg tablet  Take 1 tablet every 8 hours by oral route as needed.    . pantoprazole (PROTONIX) 40 MG tablet Take one tablet by mouth twice daily (Patient taking differently: TAKE 1 TABLET (40 MG) BY MOUTH ONCE DAILY IN THE MORNING.) 180 tablet 3  . PARoxetine (PAXIL) 10 MG tablet Take 1 tablet (10 mg total) by mouth at bedtime. 30 tablet 11  . polyethylene glycol powder (GLYCOLAX/MIRALAX) powder Take 17 g by mouth daily. 255 g 0  . Polyvinyl Alcohol-Povidone (REFRESH OP) Place 1 drop into both eyes 2 (two) times daily.    . predniSONE (DELTASONE) 10 MG tablet prednisone 10 mg tablet  1 daily    . Probiotic Product (PROBIOTIC PO) Probiotic 10 billion cell capsule  daily    . silver sulfADIAZINE (SILVADENE) 1 % cream silver sulfadiazine 1 % topical cream    . Sodium Chloride 3 % AERS Saline Nasal Mist 3 %  spray nose deeply every hour or two and blow nose    . sucralfate (CARAFATE) 1 g tablet sucralfate 1 gram tablet    . topiramate (TOPAMAX) 100 MG tablet topiramate 100 mg tablet  TAKE 1 TABLET BY MOUTH 2 TIMES DAILY.    . valACYclovir (VALTREX) 500 MG tablet valacyclovir 500 mg tablet  1 daily     No current facility-administered medications on file prior to visit.      Family Hx: The patient's family history includes ADD / ADHD in her daughter; Arthritis in her daughter, maternal grandmother, and mother; Asthma in her daughter and sister; Breast cancer (age of onset: 2) in her cousin; Cancer in her cousin and father; Colon cancer (age of onset: 89) in her maternal uncle; Crohn's disease in her maternal aunt; Diabetes in her maternal aunt and maternal grandmother; Heart disease in her daughter, maternal grandmother, and mother;  Hypertension in her maternal grandmother and mother; Pancreatic cancer in her father.  ROS:   Please see the history of present illness.    Review of Systems  Constitutional: Negative.        Weight gain  Respiratory: Positive for shortness of breath.   Cardiovascular: Positive for leg swelling.  Gastrointestinal: Negative.   Musculoskeletal: Negative.   Neurological: Negative.   Psychiatric/Behavioral: Negative.   All other systems reviewed and are negative.     Labs/Other Tests and Data Reviewed:    Recent Labs: 02/09/2019: TSH 3.824 04/07/2019: ALT 15; B Natriuretic Peptide 31.0 04/08/2019: BUN 15; Creatinine, Ser 1.01; Hemoglobin 13.1; Platelets 178; Potassium 3.7; Sodium 143   Recent Lipid Panel Lab Results  Component Value Date/Time   CHOL 176 07/06/2018 10:43 AM   TRIG 68.0 07/06/2018 10:43 AM   HDL 74.80 07/06/2018 10:43 AM   CHOLHDL 2 07/06/2018 10:43 AM   LDLCALC 88 07/06/2018 10:43 AM    Wt Readings from Last 3 Encounters:  04/08/19 224 lb 13.9 oz (102 kg)  04/07/19 227 lb (103 kg)  02/14/19 229 lb (103.9 kg)     Exam:    Vital Signs: Vital signs may also be detailed in the HPI LMP 01/02/2016 (Exact Date)   Wt Readings from Last 3 Encounters:  04/08/19 224 lb 13.9 oz (102 kg)  04/07/19 227 lb (103 kg)  02/14/19 229 lb (103.9 kg)   Temp Readings from Last 3  Encounters:  04/08/19 98.3 F (36.8 C) (Axillary)  04/07/19 98.2 F (36.8 C) (Oral)  02/14/19 98 F (36.7 C) (Oral)   BP Readings from Last 3 Encounters:  04/08/19 116/67  04/07/19 115/74  02/14/19 128/78   Pulse Readings from Last 3 Encounters:  04/08/19 77  04/07/19 86  02/14/19 87     Blood pressure 128/71 Pulse 98 resp 16  Well nourished, well developed female in no acute distress. Constitutional:  oriented to person, place, and time. No distress.  Head: Normocephalic and atraumatic.  Eyes:  no discharge. No scleral icterus.  Neck: Normal range of motion. Neck supple.   Pulmonary/Chest: No audible wheezing, no distress, appears comfortable Minimal to trace edema, discolored/Brawny color Musculoskeletal: Normal range of motion.  no  tenderness or deformity.  Neurological:   Coordination normal. Full exam not performed Skin:  No rash Psychiatric:  normal mood and affect. behavior is normal. Thought content normal.    ASSESSMENT & PLAN:    Connective tissue disorder ([place]) Managed by rheumatology Systemic lupus erythematosus, unspecified SLE type, unspecified organ involvement status ([place]) Managed by rheumatology, on chronic prednisone  PAIN, CHRONIC NEC Followed by pain clinic  Bilateral leg edema Less likely fluid retention, less likely cardiac issue Though unable to exclude a very small component of diastolic CHF exacerbated by her weight of 230 pounds Most likely vein/venous issue,  unable to exclude mild lymph issue Recommended compression hose up to the knees Avoid overuse of Lasix (she has stopped it for now, prior to that she had been placed on 80 daily for several days which washed her out, was back in the emergency room for fluids) -Referral to vein and vascular for consideration of venous reflux work-up -Prior cardiac work-up reviewed including echocardiogram stress test cardiac CTA, benign findings  COVID-19 Education: The signs and symptoms of COVID-19 were discussed with the patient and how to seek care for testing (follow up with PCP or arrange E-visit).  The importance of social distancing was discussed today.  Patient Risk:   After full review of this patients clinical status, I feel that they are at least moderate risk at this time.  Time:   Today, I have spent 45 minutes with the patient with telehealth technology discussing the cardiac and medical problems/diagnoses detailed above   10 min spent reviewing the chart prior to patient visit today   Medication Adjustments/Labs and Tests Ordered: Current medicines are reviewed at  length with the patient today.  Concerns regarding medicines are outlined above.   Tests Ordered: No tests ordered   Medication Changes: No changes made   Disposition: Follow-up as needed   Signed, Ida Rogue, MD  04/12/2019 9:00 AM    [place] Office [address] [place] [address], [place], [place] [address]

## 2019-04-11 NOTE — Telephone Encounter (Signed)
Called patient to confirm e visit for 04/12/19. We reviewed e visit procedure. Pt verbalized understanding. All questions were answered.   Pt will be ready at appt time with all vitals possible.   Advised pt to call for any further questions or concerns.

## 2019-04-12 ENCOUNTER — Telehealth (INDEPENDENT_AMBULATORY_CARE_PROVIDER_SITE_OTHER): Payer: Medicare Other | Admitting: Cardiovascular Disease

## 2019-04-12 ENCOUNTER — Other Ambulatory Visit: Payer: Self-pay

## 2019-04-12 DIAGNOSIS — M329 Systemic lupus erythematosus, unspecified: Secondary | ICD-10-CM | POA: Diagnosis not present

## 2019-04-12 DIAGNOSIS — M359 Systemic involvement of connective tissue, unspecified: Secondary | ICD-10-CM

## 2019-04-12 DIAGNOSIS — G8929 Other chronic pain: Secondary | ICD-10-CM | POA: Diagnosis not present

## 2019-04-12 DIAGNOSIS — R600 Localized edema: Secondary | ICD-10-CM

## 2019-04-12 NOTE — Patient Instructions (Addendum)
Needs appt with Vein and Vascular, Dr. Dew/Schnier Leg discoloration below the knees, chronic leg swelling, painful concerning for venous reflux  Referral placed for [facility] Located at [address] [place] [address] Phone number is [phone] They should reach out to you once they review referral to get appointment set up. Please let us know if you have any further questions.   Try compression hose  Medication Instructions:  No changes  If you need a refill on your cardiac medications before your next appointment, please call your pharmacy.    Lab work: No new labs needed   If you have labs (blood work) drawn today and your tests are completely normal, you will receive your results only by: Marland Kitchen MyChart Message (if you have MyChart) OR . A paper copy in the mail If you have any lab test that is abnormal or we need to change your treatment, we will call you to review the results.   Testing/Procedures: No new testing needed   Follow-Up: At [facility], you and your health needs are our priority.  As part of our continuing mission to provide you with exceptional heart care, we have created designated Provider Care Teams.  These Care Teams include your primary Cardiologist (physician) and Advanced Practice Providers (APPs -  Physician Assistants and Nurse Practitioners) who all work together to provide you with the care you need, when you need it.  . You will need a follow up appointment as needed  . Providers on your designated Care Team:   . Murray Hodgkins, NP . Christell Faith, PA-C . Marrianne Mood, PA-C  Any Other Special Instructions Will Be Listed Below (If Applicable).  For educational health videos Log in to : [email or web address] Or : [email or web address], password : [email or web address]

## 2019-04-16 ENCOUNTER — Encounter: Payer: Self-pay | Admitting: Internal Medicine

## 2019-04-17 ENCOUNTER — Other Ambulatory Visit: Payer: Self-pay

## 2019-04-17 ENCOUNTER — Ambulatory Visit
Admission: RE | Admit: 2019-04-17 | Discharge: 2019-04-17 | Disposition: A | Discharge status: Home / Self Care | Payer: Medicare Other | Source: Ambulatory Visit | Attending: Student | Admitting: Student

## 2019-04-17 ENCOUNTER — Ambulatory Visit (INDEPENDENT_AMBULATORY_CARE_PROVIDER_SITE_OTHER): Payer: Medicare Other | Admitting: Vascular Surgery

## 2019-04-17 ENCOUNTER — Encounter (INDEPENDENT_AMBULATORY_CARE_PROVIDER_SITE_OTHER): Payer: Self-pay | Admitting: Vascular Surgery

## 2019-04-17 VITALS — BP 133/76 | HR 90 | Resp 16 | Ht 70.0 in | Wt 231.6 lb

## 2019-04-17 DIAGNOSIS — R1013 Epigastric pain: Secondary | ICD-10-CM | POA: Diagnosis present

## 2019-04-17 DIAGNOSIS — K760 Fatty (change of) liver, not elsewhere classified: Secondary | ICD-10-CM | POA: Insufficient documentation

## 2019-04-17 DIAGNOSIS — Z79899 Other long term (current) drug therapy: Secondary | ICD-10-CM

## 2019-04-17 DIAGNOSIS — I890 Lymphedema, not elsewhere classified: Secondary | ICD-10-CM

## 2019-04-17 DIAGNOSIS — I872 Venous insufficiency (chronic) (peripheral): Secondary | ICD-10-CM

## 2019-04-17 DIAGNOSIS — J45909 Unspecified asthma, uncomplicated: Secondary | ICD-10-CM

## 2019-04-17 DIAGNOSIS — R1011 Right upper quadrant pain: Secondary | ICD-10-CM | POA: Diagnosis present

## 2019-04-17 DIAGNOSIS — K219 Gastro-esophageal reflux disease without esophagitis: Secondary | ICD-10-CM

## 2019-04-18 ENCOUNTER — Encounter (INDEPENDENT_AMBULATORY_CARE_PROVIDER_SITE_OTHER): Payer: Self-pay | Admitting: Vascular Surgery

## 2019-04-18 DIAGNOSIS — I890 Lymphedema, not elsewhere classified: Secondary | ICD-10-CM | POA: Insufficient documentation

## 2019-04-18 DIAGNOSIS — I872 Venous insufficiency (chronic) (peripheral): Secondary | ICD-10-CM | POA: Insufficient documentation

## 2019-04-18 NOTE — Progress Notes (Signed)
MRN : [id number]  [name] [name] is a 53 y.o. (1966/01/28) female who presents with chief complaint of  Chief Complaint  Patient presents with   New Patient (Initial Visit)    ref Rockey Situ for le edema  .  History of Present Illness:   Patient is seen for evaluation of leg pain and leg swelling. The patient first noticed the swelling remotely. The swelling is associated with pain and discoloration. The pain and swelling worsens with prolonged dependency and improves with elevation. The pain is unrelated to activity.  The patient notes that in the morning the legs are significantly improved but they steadily worsened throughout the course of the day. The patient also notes a steady worsening of the discoloration in the ankle and shin area.   The patient denies claudication symptoms.  The patient denies symptoms consistent with rest pain.  The patient denies and extensive history of DJD and LS spine disease.  The patient has no had any past angiography, interventions or vascular surgery.  Elevation makes the leg symptoms better, dependency makes them much worse. There is no history of ulcerations. The patient denies any recent changes in medications.  The patient has not been wearing graduated compression.  The patient denies a history of DVT or PE. There is no prior history of phlebitis. There is no history of primary lymphedema.  No history of malignancies. No history of trauma or groin or pelvic surgery. There is no history of radiation treatment to the groin or pelvis  The patient denies amaurosis fugax or recent TIA symptoms. There are no recent neurological changes noted. The patient denies recent episodes of angina or shortness of breath  Current Meds  Medication Sig   albuterol (VENTOLIN HFA) 108 (90 Base) MCG/ACT inhaler Inhale 1-2 puffs into the lungs every 4 (four) hours as needed for wheezing or shortness of breath.   ARIPiprazole (ABILIFY) 20 MG tablet Take 20  mg by mouth daily.   Ascorbic Acid (VITAMIN C) 500 MG CHEW Chew 2,500 mg by mouth daily.   aspirin 325 MG tablet Take 1 tablet (325 mg total) by mouth daily.   Biotin 10 MG CAPS Take by mouth.   Biotin 5000 MCG SUBL Place 10,000 mcg under the tongue daily.   budesonide-formoterol (SYMBICORT) 160-4.5 MCG/ACT inhaler Symbicort 160 mcg-4.5 mcg/actuation HFA aerosol inhaler   calcipotriene (DOVONOX) 0.005 % ointment calcipotriene 0.005 % topical ointment  APPLY A THIN LAYER TO THE AFFECTED AREA(S) BY TOPICAL ROUTE ONCE DAILY ; RUB IN GENTLY AND COMPLETELY   Cholecalciferol (VITAMIN D3) 2000 units capsule Vitamin D3 2,000 unit capsule   dapsone 25 MG tablet Take by mouth daily.    dexmethylphenidate (FOCALIN XR) 20 MG 24 hr capsule dexmethylphenidate ER 20 mg capsule,extended release biphasic50-50   diclofenac sodium (VOLTAREN) 1 % GEL Apply 2 g topically 4 (four) times daily as needed (for knee & finger pain.).    dicyclomine (BENTYL) 10 MG capsule dicyclomine 10 mg capsule  every 8hours as needed   doxycycline (VIBRA-TABS) 100 MG tablet Take 1 tablet (100 mg total) by mouth 2 (two) times daily. With food   DULoxetine (CYMBALTA) 60 MG capsule Take 60 mg by mouth 2 (two) times daily.   Elastic Bandages & Supports (MEDICAL COMPRESSION STOCKINGS) MISC Please provide 32mmHg compression stockings   etodolac (LODINE) 500 MG tablet etodolac 500 mg tablet   fentaNYL (DURAGESIC - DOSED MCG/HR) 75 MCG/HR Place 75 mcg onto the skin every 3 (three) days.   fluocinonide  ointment (LIDEX) 0.05 % fluocinonide 0.05 % topical ointment  APPLY TO THE AFFECTED AREA(S) BY TOPICAL ROUTE 2 TIMES PER DAY   folic acid (FOLVITE) 1 MG tablet Take 1 tablet (1 mg total) by mouth daily.   furosemide (LASIX) 20 MG tablet Take 20 mg by mouth daily as needed.    hydrocortisone (PROCTOZONE-HC) 2.5 % rectal cream Proctozone-HC 2.5 % topical cream perineal applicator   hydroxychloroquine (PLAQUENIL) 200 MG  tablet Plaquenil 200 mg tablet  Take 1 tablet twice a day by oral route after meals for 90 days.   ibuprofen (ADVIL,MOTRIN) 800 MG tablet Take 1 tablet (800 mg total) by mouth every 8 (eight) hours as needed.   imiquimod (ALDARA) 5 % cream imiquimod 5 % topical cream packet  APPLY TO THE AFFECTED AREA(S) BY TOPICAL ROUTE 3 TIMES PER WEEK at bedtime   isosorbide mononitrate (IMDUR) 60 MG 24 hr tablet Take 1 tablet (60 mg total) by mouth daily.   levothyroxine (SYNTHROID, LEVOTHROID) 25 MCG tablet Take 1 tablet (25 mcg total) by mouth daily before breakfast.   LORazepam (ATIVAN) 2 MG tablet Take 1-2 mg by mouth See admin instructions. 1 MG DAILY AS NEEDED FOR ANXIETY & SCHEDULED AT BEDTIME EACH NIGHT   meclizine (ANTIVERT) 25 MG tablet Take 1 tablet (25 mg total) by mouth 3 (three) times daily as needed for dizziness or nausea.   methylphenidate 18 MG PO CR tablet Take by mouth.    montelukast (SINGULAIR) 10 MG tablet montelukast 10 mg tablet 1 pill qhs   Multiple Vitamins-Minerals (MULTI-VITAMIN GUMMIES PO) Multi Vitamin  GUMMIES   omeprazole (PRILOSEC) 40 MG capsule Take 40 mg by mouth 2 (two) times daily.   ondansetron (ZOFRAN) 4 MG tablet Zofran 8 mg tablet  Take 1 tablet every 8 hours by oral route as needed.   pantoprazole (PROTONIX) 40 MG tablet Take one tablet by mouth twice daily (Patient taking differently: TAKE 1 TABLET (40 MG) BY MOUTH ONCE DAILY IN THE MORNING.)   PARoxetine (PAXIL) 10 MG tablet Take 1 tablet (10 mg total) by mouth at bedtime.   polyethylene glycol powder (GLYCOLAX/MIRALAX) powder Take 17 g by mouth daily.   Polyvinyl Alcohol-Povidone (REFRESH OP) Place 1 drop into both eyes 2 (two) times daily.   predniSONE (DELTASONE) 10 MG tablet prednisone 10 mg tablet  1 daily   Probiotic Product (PROBIOTIC PO) Probiotic 10 billion cell capsule  daily   silver sulfADIAZINE (SILVADENE) 1 % cream silver sulfadiazine 1 % topical cream   Sodium Chloride 3 %  AERS Saline Nasal Mist 3 %  spray nose deeply every hour or two and blow nose   sucralfate (CARAFATE) 1 g tablet sucralfate 1 gram tablet   topiramate (TOPAMAX) 100 MG tablet topiramate 100 mg tablet  TAKE 1 TABLET BY MOUTH 2 TIMES DAILY.   valACYclovir (VALTREX) 500 MG tablet valacyclovir 500 mg tablet  1 daily    Past Medical History:  Diagnosis Date   ADHD    Anemia    ANXIETY 06/06/2007   Arthritis    related to lupus   Asthma    BACK PAIN 02/22/2009   Cervical cancer ([place])    LEEP 2005    Chicken pox    Chronic pain    goes to pain management provider   Collagen vascular disease ([place])    Connective tissue disease ([place])    DEPRESSION 06/06/2007   Essential tremor    Fibromyalgia    Fibromyalgia    FREQUENCY, URINARY  07/07/2007   Gastritis    GERD 06/06/2007   Glaucoma    Headache    h/o migraines    History of hiatal hernia    History of kidney stones    HYPOTHYROIDISM 06/06/2007   INTERSTITIAL CYSTITIS 09/11/2010   Lupus ([place]) 2006   Lupus ([place])    Menopause    Migraine    Mitral valve regurgitation    NEPHROLITHIASIS, HX OF 11/05/2010   Neuropathy    Neuropathy    OCD (obsessive compulsive disorder)    Optic neuritis    2005   OSTEOPENIA 10/14/2009   Osteoporosis    OVERACTIVE BLADDER 10/14/2009   PAIN, CHRONIC NEC 06/06/2007   Panniculitis    POLYARTHRITIS 08/19/2007   Raynaud's disease    Sicca syndrome ([place])    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 Marcelline Mates [facility]    Vasculitis [facility])    WEIGHT GAIN 02/22/2009    Past Surgical History:  Procedure Laterality Date   APPENDECTOMY     CERVICAL BIOPSY  W/ LOOP ELECTRODE EXCISION  2005   CHOLECYSTECTOMY     ESOPHAGOGASTRODUODENOSCOPY (EGD) WITH PROPOFOL N/A 08/27/2017   Procedure: ESOPHAGOGASTRODUODENOSCOPY (EGD) WITH PROPOFOL;  Surgeon: Manya Silvas, MD;  Location: [facility] ENDOSCOPY;  Service: Endoscopy;  Laterality: N/A;   HAND  SURGERY     repair of left metatarsal    HARDWARE REMOVAL Right 02/16/2017   Procedure: HARDWARE REMOVAL FROM HIP;  Surgeon: Hessie Knows, MD;  Location: [facility] ORS;  Service: Orthopedics;  Laterality: Right;   JOINT REPLACEMENT     OTHER SURGICAL HISTORY     left 3rd metatarsal fracture repair 2011    REVISION TOTAL HIP ARTHROPLASTY  06/2009   replacement 2010 then screw and plate removal 3825; right hip   TONSILLECTOMY     TONSILLECTOMY     wrist  ligament repair bilateral     WRIST RECONSTRUCTION     WRIST SURGERY     laceration of right wrist ligaments   wrist surgery     laceration of left wrist surgery     Social History Social History   Tobacco Use   Smoking status: Never Smoker   Smokeless tobacco: Never Used  Substance Use Topics   Alcohol use: No   Drug use: No    Family History Family History  Problem Relation Age of Onset   Hypertension Mother    Arthritis Mother    Heart disease Mother        afib   Asthma Sister    Asthma Daughter    Arthritis Daughter    Heart disease Daughter        ?heart condition on BB   ADD / ADHD Daughter    Pancreatic cancer Father    Cancer Father        pancreatitic    Diabetes Maternal Grandmother    Heart disease Maternal Grandmother    Arthritis Maternal Grandmother    Hypertension Maternal Grandmother    Colon cancer Maternal Uncle 44   Crohn's disease Maternal Aunt    Diabetes Maternal Aunt    Breast cancer Cousin 49       maternal   Cancer Cousin        m cousin breat cancer s/p removal both breasts   No family history of bleeding/clotting disorders, porphyria or autoimmune disease   Allergies  Allergen Reactions   Bee Venom Itching and Swelling    Affected area Affected area  Affected area Affected area    Fish Allergy Hives, Swelling and Rash    Facial swelling  Facial swelling  Facial swelling Facial swelling  Facial swelling    Latex Anaphylaxis, Rash and  Shortness Of Breath    Rash  Rash     Prasterone Other (See Comments) and Nausea And Vomiting    rash Other reaction(s): Headache Headaches.   Shellfish Allergy Hives, Other (See Comments), Rash and Swelling    Facial swelling Uncoded Allergy. Allergen: seafood Uncoded Allergy. Allergen: CATS, Other Reaction: itch, wheezing Uncoded Allergy. Allergen: COMPAZINE, Other Reaction: tremors Facial swelling Facial swelling Uncoded Allergy. Allergen: seafood Uncoded Allergy. Allergen: CATS, Other Reaction: itch, wheezing Uncoded Allergy. Allergen: COMPAZINE, Other Reaction: tremors Facial swelling Uncoded Allergy. Allergen: seafood Uncoded Allergy. Allergen: CATS, Other Reaction: itch, wheezing Uncoded Allergy. Allergen: COMPAZINE, Other Reaction: tremors Facial swelling Facial swelling Uncoded Allergy. Allergen: seafood Uncoded Allergy. Allergen: CATS, Other Reaction: itch, wheezing Uncoded Allergy. Allergen: COMPAZINE, Other Reaction: tremors Facial swelling Facial swelling Uncoded Allergy. Allergen: seafood Uncoded Allergy. Allergen: CATS, Other Reaction: itch, wheezing Uncoded Allergy. Allergen: COMPAZINE, Other Reaction: tremors    Shellfish-Derived Products Hives, Other (See Comments), Rash and Swelling    Facial swelling Uncoded Allergy. Allergen: seafood Uncoded Allergy. Allergen: CATS, Other Reaction: itch, wheezing Uncoded Allergy. Allergen: COMPAZINE, Other Reaction: tremors Facial swelling Facial swelling Uncoded Allergy. Allergen: seafood Uncoded Allergy. Allergen: CATS, Other Reaction: itch, wheezing Uncoded Allergy. Allergen: COMPAZINE, Other Reaction: tremors Facial swelling Uncoded Allergy. Allergen: seafood Uncoded Allergy. Allergen: CATS, Other Reaction: itch, wheezing Uncoded Allergy. Allergen: COMPAZINE, Other Reaction: tremors Facial swelling   Compazine [Prochlorperazine Edisylate] Other (See Comments)    tremors   Dhea [Nutritional Supplements]  Other (See Comments)    Headaches.   Dilaudid [Hydromorphone Hcl]     ? reaction   Lactose    Lactose Intolerance (Gi)    Other Other (See Comments)    Other reaction(s): Unknown    Prochlorperazine     Other reaction(s): Other (See Comments) ticks   Promethazine     Other reaction(s): Other (See Comments) Ticks   Promethazine Hcl Other (See Comments)    CNS disorder   Reclast [Zoledronic Acid]     Weakness could not move limbs, fatigue, increase sleep   Clarithromycin Hives and Rash   Hydromorphone Hcl Rash and Hives    "Rash all over"   Penicillins Rash and Other (See Comments)    Has patient had a PCN reaction causing immediate rash, facial/tongue/throat swelling, SOB or lightheadedness with hypotension:No Has patient had a PCN reaction causing severe rash involving mucus membranes or skin necrosis:No Has patient had a PCN reaction that required hospitalization:No Has patient had a PCN reaction occurring within the last 10 years:No If all of the above answers are "NO", then may proceed with Cephalosporin use.     REVIEW OF SYSTEMS (Negative unless checked)  Constitutional: [] Weight loss  [] Fever  [] Chills Cardiac: [] Chest pain   [] Chest pressure   [] Palpitations   [] Shortness of breath when laying flat   [] Shortness of breath with exertion. Vascular:  [] Pain in legs with walking   [x] Pain in legs at rest  [] History of DVT   [] Phlebitis   [x] Swelling in legs   [] Varicose veins   [] Non-healing ulcers Pulmonary:   [] Uses home oxygen   [] Productive cough   [] Hemoptysis   [] Wheeze  [] COPD   [] Asthma Neurologic:  [] Dizziness   [] Seizures   [] History of stroke   [] History of TIA  [] Aphasia   []   Vissual changes   [] Weakness or numbness in arm   [] Weakness or numbness in leg Musculoskeletal:   [x] Joint swelling   [x] Joint pain   [] Low back pain Hematologic:  [] Easy bruising  [] Easy bleeding   [] Hypercoagulable state   [] Anemic Gastrointestinal:  [] Diarrhea   [] Vomiting   [x] Gastroesophageal reflux/heartburn   [] Difficulty swallowing. Genitourinary:  [] Chronic kidney disease   [] Difficult urination  [] Frequent urination   [] Blood in urine Skin:  [] Rashes   [] Ulcers  Psychological:  [] History of anxiety   []  History of major depression.  Physical Examination  Vitals:   04/17/19 1308  BP: 133/76  Pulse: 90  Resp: 16  Weight: 231 lb 9.6 oz (105.1 kg)  Height: 5\' 10"  (1.778 m)   Body mass index is 33.23 kg/m. Gen: WD/WN, NAD Head: [place]/AT, No temporalis wasting.  Ear/Nose/Throat: Hearing grossly intact, nares w/o erythema or drainage, poor dentition Eyes: PER, EOMI, sclera nonicteric.  Neck: Supple, no masses.  No bruit or JVD.  Pulmonary:  Good air movement, clear to auscultation bilaterally, no use of accessory muscles.  Cardiac: RRR, normal S1, S2, no Murmurs. Vascular: scattered varicosities present bilaterally.  severe venous stasis changes to the legs bilaterally.  3+ hard edema Vessel Right Left  Radial Palpable Palpable  PT Trace Palpable Trace Palpable  DP Palpable Palpable  Gastrointestinal: soft, non-distended. No guarding/no peritoneal signs.  Musculoskeletal: M/S 5/5 throughout.  No deformity or atrophy.  Neurologic: CN 2-12 intact. Pain and light touch intact in extremities.  Symmetrical.  Speech is fluent. Motor exam as listed above. Psychiatric: Judgment intact, Mood & affect appropriate for pt's clinical situation. Dermatologic: severe rashes no ulcers noted.  No changes consistent with cellulitis. Lymph : No Cervical lymphadenopathy, no lichenification or skin changes of chronic lymphedema.  CBC Lab Results  Component Value Date   WBC 4.1 04/08/2019   HGB 13.1 04/08/2019   HCT 41.6 04/08/2019   MCV 89.8 04/08/2019   PLT 178 04/08/2019    BMET    Component Value Date/Time   NA 143 04/08/2019 1456   NA 141 09/25/2014 0015   K 3.7 04/08/2019 1456   K 4.2 09/25/2014 0015   CL 105 04/08/2019 1456   CL 110 (H) 09/25/2014  0015   CO2 28 04/08/2019 1456   CO2 24 09/25/2014 0015   GLUCOSE 110 (H) 04/08/2019 1456   GLUCOSE 121 (H) 09/25/2014 0015   BUN 15 04/08/2019 1456   BUN 21 (H) 09/25/2014 0015   CREATININE 1.01 (H) 04/08/2019 1456   CREATININE 0.93 09/25/2014 0015   CALCIUM 9.3 04/08/2019 1456   CALCIUM 8.4 (L) 09/25/2014 0015   GFRNONAA >60 04/08/2019 1456   GFRNONAA >60 09/25/2014 0015   GFRNONAA >60 07/03/2014 0122   GFRAA >60 04/08/2019 1456   GFRAA >60 09/25/2014 0015   GFRAA >60 07/03/2014 0122   Estimated Creatinine Clearance: 83.5 mL/min (A) (by C-G formula based on SCr of 1.01 mg/dL (H)).  COAG Lab Results  Component Value Date   INR 0.98 10/11/2017   INR 1.7 (H) 07/08/2009   INR 1.8 (H) 07/07/2009    Radiology US Abdomen Complete  Result Date: 04/17/2019 CLINICAL DATA:  Epigastric, right upper quadrant pain. Hepatic steatosis. EXAM: ABDOMEN ULTRASOUND COMPLETE COMPARISON:  MRI 04/09/2018 FINDINGS: Gallbladder: Prior cholecystectomy Common bile duct: Diameter: Normal caliber, 4 mm Liver: Increased echotexture compatible with fatty infiltration. No focal abnormality or biliary ductal dilatation. Portal vein is patent on color Doppler imaging with normal direction of blood flow towards the  liver. IVC: No abnormality visualized. Pancreas: Not well seen due to overlying bowel gas. Spleen: Size and appearance within normal limits. Right Kidney: Length: 11.2 cm. 1.9 cm cyst in the lower pole. No mass or hydronephrosis. Normal echotexture. Left Kidney: Length: 9.1 cm. Echogenicity within normal limits. No mass or hydronephrosis visualized. Abdominal aorta: No aneurysm visualized. Other findings: None. IMPRESSION: Prior cholecystectomy. Fatty infiltration of the liver. No acute findings. Electronically Signed   By: Rolm Baptise M.D.   On: 04/17/2019 13:03   Dg Chest Portable 1 View  Result Date: 04/07/2019 CLINICAL DATA:  Worsening bilateral lower extremity edema. EXAM: PORTABLE CHEST 1 VIEW  COMPARISON:  02/09/2019.  CT 10/11/2017. FINDINGS: Irregular a pickle opacities noted bilaterally similar to prior chest x-ray. No pleuroparenchymal scarring was evident on the CT scan from 1.5 years ago. The cardio pericardial silhouette is enlarged. There is pulmonary vascular congestion without overt pulmonary edema. No pleural effusion. The visualized bony structures of the thorax are intact. IMPRESSION: Irregular biapical peripheral opacities, potentially reflecting pleuroparenchymal scarring, but new since 10/11/2017. apical infection cannot be excluded. CT imaging could be used to further evaluate as clinically warranted. Electronically Signed   By: Misty Stanley M.D.   On: 04/07/2019 13:45     Assessment/Plan 1. Chronic venous insufficiency No surgery or intervention at this point in time.    I have had a long discussion with the patient regarding venous insufficiency and why it  causes symptoms. I have discussed with the patient the chronic skin changes that accompany venous insufficiency and the long term sequela such as infection and ulceration.  Patient will begin wearing graduated compression stockings class 1 (20-30 mmHg) or compression wraps on a daily basis a prescription was given. The patient will put the stockings on first thing in the morning and removing them in the evening. The patient is instructed specifically not to sleep in the stockings.    In addition, behavioral modification including several periods of elevation of the lower extremities during the day will be continued. I have demonstrated that proper elevation is a position with the ankles at heart level.  The patient is instructed to begin routine exercise, especially walking on a daily basis  Patient's duplex ultrasound of the venous system is negative for DVT and reflux is not present.  Following the review of the ultrasound the patient will follow up in 2-3 months to reassess the degree of swelling and the control  that graduated compression stockings or compression wraps  is offering.   The patient can be assessed for a Lymph Pump at that time  2. Lymphedema I have had a long discussion with the patient regarding swelling and why it  causes symptoms.  Patient will begin wearing graduated compression stockings class 1 (20-30 mmHg) on a daily basis a prescription was given. The patient will  beginning wearing the stockings first thing in the morning and removing them in the evening. The patient is instructed specifically not to sleep in the stockings.   In addition, behavioral modification will be initiated.  This will include frequent elevation, use of over the counter pain medications and exercise such as walking.  I have reviewed systemic causes for chronic edema such as liver, kidney and cardiac etiologies.  The patient denies problems with these organ systems.    Consideration for a lymph pump will also be made based upon the effectiveness of conservative therapy.  This would help to improve the edema control and prevent sequela such as  ulcers and infections   Patient's duplex ultrasound of the venous system is negative for DVT and reflux is not present.  The patient will follow-up with me after the ultrasound.    3. Moderate asthma, unspecified whether complicated, unspecified whether persistent Continue pulmonary medications and aerosols as already ordered, these medications have been reviewed and there are no changes at this time.  4. Gastroesophageal reflux disease without esophagitis Continue PPI as already ordered, this medication has been reviewed and there are no changes at this time.  Avoidence of caffeine and alcohol  Moderate elevation of the head of the bed     Hortencia Pilar, MD  04/18/2019 1:04 PM

## 2019-04-19 ENCOUNTER — Other Ambulatory Visit: Payer: Self-pay | Admitting: Internal Medicine

## 2019-04-19 DIAGNOSIS — Z87898 Personal history of other specified conditions: Secondary | ICD-10-CM

## 2019-04-19 DIAGNOSIS — K760 Fatty (change of) liver, not elsewhere classified: Secondary | ICD-10-CM

## 2019-04-19 DIAGNOSIS — E669 Obesity, unspecified: Secondary | ICD-10-CM

## 2019-04-26 ENCOUNTER — Ambulatory Visit (INDEPENDENT_AMBULATORY_CARE_PROVIDER_SITE_OTHER): Payer: Medicare Other | Admitting: Internal Medicine

## 2019-04-26 ENCOUNTER — Other Ambulatory Visit: Payer: Self-pay

## 2019-04-26 DIAGNOSIS — B353 Tinea pedis: Secondary | ICD-10-CM

## 2019-04-26 DIAGNOSIS — M25562 Pain in left knee: Secondary | ICD-10-CM

## 2019-04-26 DIAGNOSIS — G8929 Other chronic pain: Secondary | ICD-10-CM | POA: Diagnosis not present

## 2019-04-26 DIAGNOSIS — M7050 Other bursitis of knee, unspecified knee: Secondary | ICD-10-CM | POA: Diagnosis not present

## 2019-04-26 MED ORDER — FLUCONAZOLE 150 MG PO TABS: 150.0000 mg | ORAL_TABLET | ORAL | 0 refills | Status: DC

## 2019-04-26 MED ORDER — TERBINAFINE HCL 1 % EX CREA: 1.0000 "application " | TOPICAL_CREAM | Freq: Two times a day (BID) | CUTANEOUS | 2 refills | Status: DC

## 2019-04-26 NOTE — Patient Instructions (Signed)
Pes Anserine Bursitis    The pes anserine is an area on the inside of your knee, just below the joint, that is cushioned by a fluid-filled sac (bursa). Pes anserine bursitis is a condition that happens when the bursa gets swollen and irritated. The condition causes knee pain.  What are the causes?  This condition may be caused by:  · Making the same movement over and over.  · A direct hit (trauma) to the inside of the leg.  What increases the risk?  You are more likely to develop this condition if you:  · Are a runner.  · Play sports that involve a lot of running and quick side-to-side movements (cutting).  · Are an athlete who plays contact sports.  · Swim using an inward angle of the knee, such as with the breaststroke.  · Have tight hamstring muscles.  · Are a woman.  · Are overweight.  · Have flat feet.  · Have diabetes or osteoarthritis.  What are the signs or symptoms?  Symptoms of this condition include:  · Knee pain that gets better with rest and worse with activities like climbing stairs, walking, running, or getting in and out of a chair.  · Swelling.  · Warmth.  · Tenderness when pressing at the inside of the lower leg, just below the knee.  How is this diagnosed?  This condition may be diagnosed based on:  · Your symptoms.  · Your medical history.  · A physical exam.  ? During your physical exam, your health care provider will press on the tendon attachment to see if you feel pain.  ? Your health care provider may also check your hip and knee motion and strength.  · Tests to check for swelling and fluid buildup in the bursa and to look at muscles, bones, and tendons. These tests might include:  ? X-rays.  ? MRI.  ? Ultrasound.  How is this treated?  This condition may be treated by:  · Resting your knee. You may be told to raise (elevate) your knee while resting.  · Avoiding activities that cause pain.  · Icing the inside of your knee.  · Sleeping with a pillow between your knees. This will cushion your  injured knee.  · Taking medicine by mouth (orally) to reduce pain and swelling or having medicine injected into your knee.  · Doing strengthening and stretching exercises (physical therapy).  If these treatments do not work or if the condition keeps coming back, you may need to have surgery to remove the bursa.  Follow these instructions at home:  Managing pain, stiffness, and swelling    · If directed, put ice on the injured area.  ? Put ice in a plastic bag.  ? Place a towel between your skin and the bag.  ? Leave the ice on for 20 minutes, 2-3 times a day.  · Elevate the injured area above the level of your heart while you are sitting or lying down.  Activity  · Return to your normal activities as told by your health care provider. Ask your health care provider what activities are safe for you.  · Do exercises as told by your health care provider.  General instructions  · Take over-the-counter and prescription medicines only as told by your health care provider.  · Sleep with a pillow between your knees.  · Do not use any products that contain nicotine or tobacco, such as cigarettes, e-cigarettes, and chewing tobacco. These   can delay healing. If you need help quitting, ask your health care provider.  · If you are overweight, work with your health care provider and a dietitian to set a weight-loss goal that is healthy and reasonable for you.  · Keep all follow-up visits as told by your health care provider. This is important.  How is this prevented?  · When exercising, make sure that you:  ? Warm up and stretch before being active.  ? Cool down and stretch after being active.  ? Give your body time to rest between periods of activity.  ? Use equipment that fits you.  ? Are safe and responsible while being active to avoid falls.  ? Do at least 150 minutes of moderate-intensity exercise each week, such as brisk walking or water aerobics.  ? Maintain physical fitness,  including:  § Strength.  § Flexibility.  § Cardiovascular fitness.  § Endurance.  ? Maintain a healthy weight.  Contact a health care provider if:  · Your symptoms do not improve.  · Your symptoms get worse.  Summary  · Pes anserine bursitis is a condition that happens when the fluid-filled sac (bursa) at the inside of your knee gets swollen and irritated. The condition causes knee pain.  · Treatment for pes anserine bursitis may include resting your knee, icing the inside of your knee, sleeping with a pillow between your knees, taking medicine by mouth or by injection, and doing strengthening and stretching exercises (physical therapy).  · Follow instructions for managing pain, stiffness, and swelling.  · Take over-the-counter and prescription medicines only as told by your health care provider.  This information is not intended to replace advice given to you by your health care provider. Make sure you discuss any questions you have with your health care provider.  Document Released: 11/16/2005 Document Revised: 04/27/2018 Document Reviewed: 04/27/2018  Elsevier Interactive Patient Education © 2019 [employer]

## 2019-04-26 NOTE — Progress Notes (Addendum)
Virtual Visit via Video Note  I connected with [name] [name]  on 04/26/19 at 11:42 AM EDT by a video enabled telemedicine application and verified that I am speaking with the correct person using two identifiers.  Location patient: home Location provider:work  Persons participating in the virtual visit: patient, provider  I discussed the limitations of evaluation and management by telemedicine and the availability of in person appointments. The patient expressed understanding and agreed to proceed.   HPI: 1. C/o right heel peeling, burning and itching in between her toes and cracks in the feet she is c/w foot fungus. 3 weeks ago she used clotrimazole otc cram which caused her skin to turn red and swell and she stopped and skin return back to normal so she thought she was allergic  2. Swelling in right anterior lower leg and pain and c/o left knee pain she will call dr. Rudene Christians to f/u    ROS: See pertinent positives and negatives per HPI.  Past Medical History:  Diagnosis Date  . ADHD   . Anemia   . ANXIETY 06/06/2007  . Arthritis    related to lupus  . Asthma   . BACK PAIN 02/22/2009  . Cervical cancer ([place])    LEEP 2005   . Chicken pox   . Chronic pain    goes to pain management provider  . Collagen vascular disease ([place])   . Connective tissue disease ([place])   . DEPRESSION 06/06/2007  . Essential tremor   . Fibromyalgia   . Fibromyalgia   . FREQUENCY, URINARY 07/07/2007  . Gastritis   . GERD 06/06/2007  . Glaucoma   . Headache    h/o migraines   . History of hiatal hernia   . History of kidney stones   . HYPOTHYROIDISM 06/06/2007  . INTERSTITIAL CYSTITIS 09/11/2010  . Lupus ([place]) 2006  . Lupus ([place])   . Menopause   . Migraine   . Mitral valve regurgitation   . NEPHROLITHIASIS, HX OF 11/05/2010  . Neuropathy   . Neuropathy   . OCD (obsessive compulsive disorder)   . Optic neuritis    2005  . OSTEOPENIA 10/14/2009  . Osteoporosis   . OVERACTIVE BLADDER 10/14/2009   . PAIN, CHRONIC NEC 06/06/2007  . Panniculitis   . POLYARTHRITIS 08/19/2007  . Raynaud's disease   . Sicca syndrome ([place])   . 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 Vaginal prolapse    Dr. Marcelline Mates [facility]   . Vasculitis ([place])   . WEIGHT GAIN 02/22/2009    Past Surgical History:  Procedure Laterality Date  . APPENDECTOMY    . CERVICAL BIOPSY  W/ LOOP ELECTRODE EXCISION  2005  . CHOLECYSTECTOMY    . ESOPHAGOGASTRODUODENOSCOPY (EGD) WITH PROPOFOL N/A 08/27/2017   Procedure: ESOPHAGOGASTRODUODENOSCOPY (EGD) WITH PROPOFOL;  Surgeon: Manya Silvas, MD;  Location: [facility] ENDOSCOPY;  Service: Endoscopy;  Laterality: N/A;  . HAND SURGERY     repair of left metatarsal   . HARDWARE REMOVAL Right 02/16/2017   Procedure: HARDWARE REMOVAL FROM HIP;  Surgeon: Hessie Knows, MD;  Location: [facility] ORS;  Service: Orthopedics;  Laterality: Right;  . JOINT REPLACEMENT    . OTHER SURGICAL HISTORY     left 3rd metatarsal fracture repair 2011   . REVISION TOTAL HIP ARTHROPLASTY  06/2009   replacement 2010 then screw and plate removal 5993; right hip  . TONSILLECTOMY    . TONSILLECTOMY    . wrist  ligament repair bilateral    . WRIST RECONSTRUCTION    .  WRIST SURGERY     laceration of right wrist ligaments  . wrist surgery     laceration of left wrist surgery     Family History  Problem Relation Age of Onset  . Hypertension Mother   . Arthritis Mother   . Heart disease Mother        afib  . Asthma Sister   . Asthma Daughter   . Arthritis Daughter   . Heart disease Daughter        ?heart condition on BB  . ADD / ADHD Daughter   . Pancreatic cancer Father   . Cancer Father        pancreatitic   . Diabetes Maternal Grandmother   . Heart disease Maternal Grandmother   . Arthritis Maternal Grandmother   . Hypertension Maternal Grandmother   . Colon cancer Maternal Uncle 81  . Crohn's disease Maternal Aunt   . Diabetes Maternal Aunt   . Breast cancer Cousin 66       maternal  . Cancer  Cousin        m cousin breat cancer s/p removal both breasts     SOCIAL HX: lives with daughter and mother    Current Outpatient Medications:  .  albuterol (VENTOLIN HFA) 108 (90 Base) MCG/ACT inhaler, Inhale 1-2 puffs into the lungs every 4 (four) hours as needed for wheezing or shortness of breath., Disp: 1 Inhaler, Rfl: 12 .  ARIPiprazole (ABILIFY) 20 MG tablet, Take 20 mg by mouth daily., Disp: , Rfl:  .  Ascorbic Acid (VITAMIN C) 500 MG CHEW, Chew 2,500 mg by mouth daily., Disp: , Rfl:  .  aspirin 325 MG tablet, Take 1 tablet (325 mg total) by mouth daily., Disp: 30 tablet, Rfl: 0 .  Biotin 10 MG CAPS, Take by mouth., Disp: , Rfl:  .  Biotin 5000 MCG SUBL, Place 10,000 mcg under the tongue daily., Disp: , Rfl:  .  budesonide-formoterol (SYMBICORT) 160-4.5 MCG/ACT inhaler, Symbicort 160 mcg-4.5 mcg/actuation HFA aerosol inhaler, Disp: , Rfl:  .  calcipotriene (DOVONOX) 0.005 % ointment, calcipotriene 0.005 % topical ointment  APPLY A THIN LAYER TO THE AFFECTED AREA(S) BY TOPICAL ROUTE ONCE DAILY ; RUB IN GENTLY AND COMPLETELY, Disp: , Rfl:  .  Cholecalciferol (VITAMIN D3) 2000 units capsule, Vitamin D3 2,000 unit capsule, Disp: , Rfl:  .  dapsone 25 MG tablet, Take by mouth daily. , Disp: , Rfl:  .  dexmethylphenidate (FOCALIN XR) 20 MG 24 hr capsule, dexmethylphenidate ER 20 mg capsule,extended release biphasic50-50, Disp: , Rfl:  .  diclofenac sodium (VOLTAREN) 1 % GEL, Apply 2 g topically 4 (four) times daily as needed (for knee & finger pain.). , Disp: , Rfl:  .  dicyclomine (BENTYL) 10 MG capsule, dicyclomine 10 mg capsule  every 8hours as  needed, Disp: , Rfl:  .  doxycycline (VIBRA-TABS) 100 MG tablet, Take 1 tablet (100 mg total) by mouth 2 (two) times daily. With food, Disp: 14 tablet, Rfl: 0 .  DULoxetine (CYMBALTA) 60 MG capsule, Take 60 mg by mouth 2 (two) times daily., Disp: , Rfl:  .  Elastic Bandages & Supports (MEDICAL COMPRESSION STOCKINGS) MISC, Please provide 84mmHg  compression stockings, Disp: 1 each, Rfl: 0 .  etodolac (LODINE) 500 MG tablet, etodolac 500 mg tablet, Disp: , Rfl:  .  fentaNYL (DURAGESIC - DOSED MCG/HR) 75 MCG/HR, Place 75 mcg onto the skin every 3 (three) days., Disp: , Rfl:  .  fluocinonide ointment (LIDEX) 0.05 %, fluocinonide  0.05 % topical ointment  APPLY TO THE AFFECTED AREA(S) BY TOPICAL ROUTE 2 TIMES PER DAY, Disp: , Rfl:  .  folic acid (FOLVITE) 1 MG tablet, Take 1 tablet (1 mg total) by mouth daily., Disp: 90 tablet, Rfl: 3 .  furosemide (LASIX) 20 MG tablet, Take 20 mg by mouth daily as needed. , Disp: , Rfl:  .  hydrocortisone (PROCTOZONE-HC) 2.5 % rectal cream, Proctozone-HC 2.5 % topical cream perineal applicator, Disp: , Rfl:  .  hydroxychloroquine (PLAQUENIL) 200 MG tablet, Plaquenil 200 mg tablet  Take 1 tablet twice a day by oral route after meals for 90 days., Disp: , Rfl:  .  ibuprofen (ADVIL,MOTRIN) 800 MG tablet, Take 1 tablet (800 mg total) by mouth every 8 (eight) hours as needed., Disp: 30 tablet, Rfl: 0 .  imiquimod (ALDARA) 5 % cream, imiquimod 5 % topical cream packet  APPLY TO THE AFFECTED AREA(S) BY TOPICAL ROUTE 3 TIMES PER WEEK at bedtime, Disp: , Rfl:  .  isosorbide mononitrate (IMDUR) 60 MG 24 hr tablet, Take 1 tablet (60 mg total) by mouth daily., Disp: 20 tablet, Rfl: 0 .  levothyroxine (SYNTHROID, LEVOTHROID) 25 MCG tablet, Take 1 tablet (25 mcg total) by mouth daily before breakfast., Disp: 90 tablet, Rfl: 3 .  LORazepam (ATIVAN) 2 MG tablet, Take 1-2 mg by mouth See admin instructions. 1 MG DAILY AS NEEDED FOR ANXIETY & SCHEDULED AT BEDTIME EACH NIGHT, Disp: , Rfl:  .  meclizine (ANTIVERT) 25 MG tablet, Take 1 tablet (25 mg total) by mouth 3 (three) times daily as needed for dizziness or nausea., Disp: 20 tablet, Rfl: 0 .  methylphenidate 18 MG PO CR tablet, Take by mouth. , Disp: , Rfl:  .  montelukast (SINGULAIR) 10 MG tablet, montelukast 10 mg tablet 1 pill qhs, Disp: 90 tablet, Rfl: 3 .  Multiple  Vitamins-Minerals (MULTI-VITAMIN GUMMIES PO), Multi Vitamin  GUMMIES, Disp: , Rfl:  .  omeprazole (PRILOSEC) 40 MG capsule, Take 40 mg by mouth 2 (two) times daily., Disp: , Rfl:  .  ondansetron (ZOFRAN) 4 MG tablet, Zofran 8 mg tablet  Take 1 tablet every 8 hours by oral route as needed., Disp: , Rfl:  .  pantoprazole (PROTONIX) 40 MG tablet, Take one tablet by mouth twice daily (Patient taking differently: TAKE 1 TABLET (40 MG) BY MOUTH ONCE DAILY IN THE MORNING.), Disp: 180 tablet, Rfl: 3 .  PARoxetine (PAXIL) 10 MG tablet, Take 1 tablet (10 mg total) by mouth at bedtime., Disp: 30 tablet, Rfl: 11 .  polyethylene glycol powder (GLYCOLAX/MIRALAX) powder, Take 17 g by mouth daily., Disp: 255 g, Rfl: 0 .  Polyvinyl Alcohol-Povidone (REFRESH OP), Place 1 drop into both eyes 2 (two) times daily., Disp: , Rfl:  .  predniSONE (DELTASONE) 10 MG tablet, prednisone 10 mg tablet  1 daily, Disp: , Rfl:  .  Probiotic Product (PROBIOTIC PO), Probiotic 10 billion cell capsule  daily, Disp: , Rfl:  .  silver sulfADIAZINE (SILVADENE) 1 % cream, silver sulfadiazine 1 % topical cream, Disp: , Rfl:  .  Sodium Chloride 3 % AERS, Saline Nasal Mist 3 %  spray nose deeply every hour or two and blow nose, Disp: , Rfl:  .  sucralfate (CARAFATE) 1 g tablet, sucralfate 1 gram tablet, Disp: , Rfl:  .  topiramate (TOPAMAX) 100 MG tablet, topiramate 100 mg tablet  TAKE 1 TABLET BY MOUTH 2 TIMES DAILY., Disp: , Rfl:  .  valACYclovir (VALTREX) 500 MG tablet, valacyclovir 500  mg tablet  1 daily, Disp: , Rfl:  .  fluconazole (DIFLUCAN) 150 MG tablet, Take 1 tablet (150 mg total) by mouth once a week. Repeat another dose in another week x 1 pill (over 2 weeks), Disp: 2 tablet, Rfl: 0 .  terbinafine (LAMISIL AT) 1 % cream, Apply 1 application topically 2 (two) times daily., Disp: 42 g, Rfl: 2  EXAM:  VITALS per patient if applicable:  GENERAL: alert, oriented, appears well and in no acute distress  HEENT: atraumatic,  conjunttiva clear, no obvious abnormalities on inspection of external nose and ears  NECK: normal movements of the head and neck  LUNGS: on inspection no signs of respiratory distress, breathing rate appears normal, no obvious gross SOB, gasping or wheezing  CV: no obvious cyanosis  MS: moves all visible extremities without noticeable abnormality. C/w anserine bursitis right lower leg   PSYCH/NEURO: pleasant and cooperative, no obvious depression or anxiety, speech and thought processing grossly intact  SKIN: tinea pedis right foot and hyperkeratosis right heel  ASSESSMENT AND PLAN:  Discussed the following assessment and plan:  Tinea pedis of right foot - Plan: fluconazole (DIFLUCAN) 150 MG tablet 1 pill weekly x 2 weeks, terbinafine (LAMISIL AT) 1 % cream bid prn stop clotrimazole. Can use otc hc cream if burning to feet and vasoline and try warm soaks with pommus stone for right heel for increased peeling to right heel  Chronic pain of left knee Anserine bursitis concerned with this on the right lower leg  -f/u with Dr. Rudene Christians pt will call and schedule    Rib rib Xray 04/26/19 pleuroparenchymal thickening apices likely chronic and right nondisplaced 7th rib fracture   I discussed the assessment and treatment plan with the patient. The patient was provided an opportunity to ask questions and all were answered. The patient agreed with the plan and demonstrated an understanding of the instructions.   The patient was advised to call back or seek an in-person evaluation if the symptoms worsen or if the condition fails to improve as anticipated.  15 minutes  Delorise Jackson, MD

## 2019-04-26 NOTE — Progress Notes (Signed)
Pre visit review using our clinic review tool, if applicable. No additional management support is needed unless otherwise documented below in the visit note. 

## 2019-05-03 ENCOUNTER — Encounter: Payer: Self-pay | Admitting: Internal Medicine

## 2019-05-08 ENCOUNTER — Encounter: Payer: Medicare Other | Attending: Internal Medicine | Admitting: Dietician

## 2019-05-08 ENCOUNTER — Encounter: Payer: Self-pay | Admitting: Dietician

## 2019-05-08 ENCOUNTER — Other Ambulatory Visit: Payer: Self-pay

## 2019-05-08 VITALS — Ht 70.0 in | Wt 229.0 lb

## 2019-05-08 DIAGNOSIS — Z6832 Body mass index (BMI) 32.0-32.9, adult: Secondary | ICD-10-CM | POA: Insufficient documentation

## 2019-05-08 DIAGNOSIS — E669 Obesity, unspecified: Secondary | ICD-10-CM | POA: Diagnosis not present

## 2019-05-08 DIAGNOSIS — E66811 Obesity, class 1: Secondary | ICD-10-CM

## 2019-05-08 DIAGNOSIS — Z713 Dietary counseling and surveillance: Secondary | ICD-10-CM | POA: Insufficient documentation

## 2019-05-08 DIAGNOSIS — E6609 Other obesity due to excess calories: Secondary | ICD-10-CM

## 2019-05-08 DIAGNOSIS — K760 Fatty (change of) liver, not elsewhere classified: Secondary | ICD-10-CM | POA: Diagnosis not present

## 2019-05-08 DIAGNOSIS — R7303 Prediabetes: Secondary | ICD-10-CM | POA: Diagnosis not present

## 2019-05-08 DIAGNOSIS — E739 Lactose intolerance, unspecified: Secondary | ICD-10-CM | POA: Insufficient documentation

## 2019-05-08 NOTE — Progress Notes (Signed)
Medical Nutrition Therapy: Visit start time: 1320   end time: 1425 Assessment:  Diagnosis: fatty liver, obesity, pre-diabetes Past medical history: lupus, hypothyroidism, GERD, migraines Psychosocial issues/ stress concerns:  Patient rates her stress as "high" and indicates "not so well" as to how she is handling her stress. She meets regularly with a therapist. Today, denies feeling depressed or hopeless  Preferred learning method:  . Auditory . Visual . Hands-on Current weight:  229 lbs Height: 70 in BMI 32.9 Medications, supplements: see list  Progress and evaluation:  Patient in for initial medical nutrition therapy visit. She has numerous medical diseases/conditions. She states that she wants to know "what foods will help my liver and lupus". She gave a typical recall of eating Frosted Flakes/almond milk for breakfas, 3 [place] waffles for lunch with orange juice or bagel with marg/butter combination and a 4:00 dinner with examples given of homemade pizza or or rice and vienna sausages or a Spanish soup of rice, corn and beef. Her mother prepares most of her dinner meals. She states that recently she has not eaten as much meat because she "sees in her mind the animal it comes from". Her diet is significantly low in fruits, vegetables and whole grains. Based on information given, she has a low protein intake on some days. She has lactose intolerance but includes almond milk which is fortified with calcium in addition to calcium supplement. She frequently includes sweets especially candy and sugar coated cereals and has a high intake of refined carbohydrates Her main beverage is water; rarely drinks sodas or other sugar sweetened beverages.   Physical activity: no structured exercise; has balance issues; walks with a cane   Nutrition Care Education: Weight management/pre-diabetes: Instructed on a meal plan based on 1600 calories including carbohydrate counting and how to better  balance carbohydrate, protein and non-starchy vegetables. Encouraged to use meal plan as a guide to become more mindful of food choices and portions verses a restrictive diet. Placed emphasis also on increasing anti-inflammatory foods such as fruits, vegetables and whole grains and decreasing pro-inflammatory foods such as those high in saturated fat and sugar. Fatty Liver: Discussed how meal plan instructed on for weight management and pre-diabetes also will be effective in helping to protect the liver. Vegetarian meal plan: In response to patient stating she has considered trying a vegetarian meal plan, discussed importance of adequate protein substitutes if omitting meat from the diet. Gave information on protein in the Vegan diet.  Nutritional Diagnosis:  NI-5.11.1 Predicted suboptimal nutrient intake As related to significantly low intake of fruits, vegetables, whole grains and high intake of refined carbohydrates.  As evidenced by diet history..  Intervention:  Balance meals with 2-3 oz protein (14-21 gms), 2-3 servings of carbohydrate (30-45 gms) and at lunch and dinner, add a non-starchy vegetable. Work in fruit as part of the carbohydrate. Read labels for carbohydrate as well as saturated fat. Limit saturated fat to no more than 12 gms per day. Increase overall intake of fruits and vegetables with a goal of at least 5 servings daily.  Education Materials given:  . Plate Planner . Food lists/ Planning A Balanced Meal . Combination Foods . Sample meal pattern/ menus . Protein in the Vegan diet . Goals/ instructions  Learner/ who was taught:  . Patient  Level of understanding: . Partial understanding; needs review/ practice  Demonstrated degree of understanding via:   Teach back Learning barriers: . None Willingness to learn/ readiness for change: . Acceptance, ready for  change  Monitoring and Evaluation:  Dietary intake, exercise, , and body weight      follow up: 06/12/19  at 1:30pm

## 2019-05-08 NOTE — Patient Instructions (Addendum)
Balance meals with 2-3 oz protein (14-21 gms), 2-3 servings of carbohydrate (30-45 gms) and at lunch and dinner, add a non-starchy vegetable. Work in fruit as part of the carbohydrate. Read labels for carbohydrate as well as saturated fat. Limit saturated fat to no more than 12 gms per day. Increase overall intake of fruits and vegetables with a goal of at least 5 servings daily.

## 2019-05-12 ENCOUNTER — Other Ambulatory Visit: Payer: Self-pay | Admitting: Neurology

## 2019-05-12 DIAGNOSIS — M4802 Spinal stenosis, cervical region: Secondary | ICD-10-CM

## 2019-05-22 ENCOUNTER — Other Ambulatory Visit
Admission: RE | Admit: 2019-05-22 | Discharge: 2019-05-22 | Disposition: A | Discharge status: Home / Self Care | Payer: Medicare Other | Source: Ambulatory Visit | Attending: Student | Admitting: Student

## 2019-05-22 DIAGNOSIS — R748 Abnormal levels of other serum enzymes: Secondary | ICD-10-CM | POA: Diagnosis present

## 2019-05-25 ENCOUNTER — Telehealth (INDEPENDENT_AMBULATORY_CARE_PROVIDER_SITE_OTHER): Payer: Self-pay | Admitting: Vascular Surgery

## 2019-05-25 LAB — PANCREATIC ELASTASE, FECAL: Pancreatic Elastase-1, Stool: >500 ug Elast./g (ref >200)

## 2019-05-25 NOTE — Telephone Encounter (Signed)
She would need bilateral LE reflux studies.  She can keep or 08/20 appt or we can move her up for whenever there is space available if she wants to be seen sooner.

## 2019-05-25 NOTE — Telephone Encounter (Signed)
Patient was last seen 04/17/2019, at that time she was to do conservative therapy.

## 2019-05-25 NOTE — Telephone Encounter (Signed)
Patient seen recently for edema. She states her swelling has worsened and was instructed to call back to office and report if this happens. She states it has now moved to her thigh. Please call to advise

## 2019-05-25 NOTE — Telephone Encounter (Signed)
See note below, per Eulogio Ditch NP.

## 2019-05-29 ENCOUNTER — Ambulatory Visit
Admission: RE | Admit: 2019-05-29 | Discharge: 2019-05-29 | Disposition: A | Discharge status: Home / Self Care | Payer: Medicare Other | Source: Ambulatory Visit | Attending: Neurology | Admitting: Neurology

## 2019-05-29 ENCOUNTER — Other Ambulatory Visit (INDEPENDENT_AMBULATORY_CARE_PROVIDER_SITE_OTHER): Payer: Self-pay | Admitting: Nurse Practitioner

## 2019-05-29 ENCOUNTER — Other Ambulatory Visit: Payer: Self-pay

## 2019-05-29 DIAGNOSIS — M4802 Spinal stenosis, cervical region: Secondary | ICD-10-CM | POA: Insufficient documentation

## 2019-05-29 DIAGNOSIS — R609 Edema, unspecified: Secondary | ICD-10-CM

## 2019-05-31 ENCOUNTER — Ambulatory Visit (INDEPENDENT_AMBULATORY_CARE_PROVIDER_SITE_OTHER): Payer: Medicare Other | Admitting: Nurse Practitioner

## 2019-05-31 ENCOUNTER — Other Ambulatory Visit: Payer: Self-pay

## 2019-05-31 ENCOUNTER — Encounter (INDEPENDENT_AMBULATORY_CARE_PROVIDER_SITE_OTHER): Payer: Self-pay | Admitting: Nurse Practitioner

## 2019-05-31 ENCOUNTER — Ambulatory Visit (INDEPENDENT_AMBULATORY_CARE_PROVIDER_SITE_OTHER): Payer: Medicare Other

## 2019-05-31 VITALS — BP 132/70 | HR 89 | Resp 18 | Ht 70.0 in | Wt 232.0 lb

## 2019-05-31 DIAGNOSIS — Z79899 Other long term (current) drug therapy: Secondary | ICD-10-CM | POA: Diagnosis not present

## 2019-05-31 DIAGNOSIS — I890 Lymphedema, not elsewhere classified: Secondary | ICD-10-CM

## 2019-05-31 DIAGNOSIS — K219 Gastro-esophageal reflux disease without esophagitis: Secondary | ICD-10-CM | POA: Diagnosis not present

## 2019-05-31 DIAGNOSIS — R609 Edema, unspecified: Secondary | ICD-10-CM | POA: Diagnosis not present

## 2019-05-31 DIAGNOSIS — J45909 Unspecified asthma, uncomplicated: Secondary | ICD-10-CM | POA: Diagnosis not present

## 2019-05-31 NOTE — Progress Notes (Signed)
SUBJECTIVE:  [name], female    DOB: 1966/02/25, 53 y.o.   MRN: [id number] Chief Complaint  Patient presents with  . Follow-up    Bilateral LE reflux study    HPI  [name] [name] is a 54 y.o. female Patient is seen for evaluation of leg pain and leg swelling. The patient first noticed the swelling remotely. The swelling is associated with pain and discoloration. The pain and swelling worsens with prolonged dependency and improves with elevation. The pain is unrelated to activity.  The patient notes that in the morning the legs are significantly improved but they steadily worsened throughout the course of the day. The patient also notes a steady worsening of the discoloration in the ankle and shin area.   The patient denies claudication symptoms.  The patient denies symptoms consistent with rest pain.  The patient denies and extensive history of DJD and LS spine disease.  The patient has no had any past angiography, interventions or vascular surgery.  Elevation makes the leg symptoms better, dependency makes them much worse. There is no history of ulcerations. The patient denies any recent changes in medications.  The patient has been wearing graduated compression.  The patient denies a history of DVT or PE. There is no prior history of phlebitis.  There is a history of malignancies  The patient denies amaurosis fugax or recent TIA symptoms. There are no recent neurological changes noted. The patient denies recent episodes of angina or shortness of breath   Today the patient underwent noninvasive studies which revealed reflux within the right small saphenous vein.  Otherwise there is no evidence of DVT in the bilateral lower extremities.  No evidence of superficial venous thrombosis bilaterally. Past Medical History:  Diagnosis Date  . ADHD   . Anemia   . ANXIETY 06/06/2007  . Arthritis    related to lupus  . Asthma   . BACK PAIN 02/22/2009  .  Cervical cancer ([place])    LEEP 2005   . Chicken pox   . Chronic pain    goes to pain management provider  . Collagen vascular disease ([place])   . Connective tissue disease ([place])   . DEPRESSION 06/06/2007  . Essential tremor   . Fibromyalgia   . Fibromyalgia   . FREQUENCY, URINARY 07/07/2007  . Gastritis   . GERD 06/06/2007  . Glaucoma   . Headache    h/o migraines   . History of hiatal hernia   . History of kidney stones   . HYPOTHYROIDISM 06/06/2007  . INTERSTITIAL CYSTITIS 09/11/2010  . Lupus ([place]) 2006  . Lupus ([place])   . Menopause   . Migraine   . Mitral valve regurgitation   . NEPHROLITHIASIS, HX OF 11/05/2010  . Neuropathy   . Neuropathy   . OCD (obsessive compulsive disorder)   . Optic neuritis    2005  . OSTEOPENIA 10/14/2009  . Osteoporosis   . OVERACTIVE BLADDER 10/14/2009  . PAIN, CHRONIC NEC 06/06/2007  . Panniculitis   . POLYARTHRITIS 08/19/2007  . Raynaud's disease   . Sicca syndrome ([place])   . 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 Vaginal prolapse    Dr. Marcelline Mates [facility]   . Vasculitis ([place])   . WEIGHT GAIN 02/22/2009    Past Surgical History:  Procedure Laterality Date  . APPENDECTOMY    . CERVICAL BIOPSY  W/ LOOP ELECTRODE EXCISION  2005  . CHOLECYSTECTOMY    . ESOPHAGOGASTRODUODENOSCOPY (EGD) WITH PROPOFOL N/A 08/27/2017  Procedure: ESOPHAGOGASTRODUODENOSCOPY (EGD) WITH PROPOFOL;  Surgeon: Manya Silvas, MD;  Location: [facility] ENDOSCOPY;  Service: Endoscopy;  Laterality: N/A;  . HAND SURGERY     repair of left metatarsal   . HARDWARE REMOVAL Right 02/16/2017   Procedure: HARDWARE REMOVAL FROM HIP;  Surgeon: Hessie Knows, MD;  Location: [facility] ORS;  Service: Orthopedics;  Laterality: Right;  . JOINT REPLACEMENT    . OTHER SURGICAL HISTORY     left 3rd metatarsal fracture repair 2011   . REVISION TOTAL HIP ARTHROPLASTY  06/2009   replacement 2010 then screw and plate removal 9326; right hip  . TONSILLECTOMY    . TONSILLECTOMY    . wrist  ligament repair  bilateral    . WRIST RECONSTRUCTION    . WRIST SURGERY     laceration of right wrist ligaments  . wrist surgery     laceration of left wrist surgery     Social History   Socioeconomic History  . Marital status: Single    Spouse name: Not on file  . Number of children: 1  . Years of education: Not on file  . Highest education level: Not on file  Occupational History  . Occupation: DISABLED    Employer: UNEMPLOYED  Social Needs  . Financial resource strain: Not on file  . Food insecurity    Worry: Not on file    Inability: Not on file  . Transportation needs    Medical: Not on file    Non-medical: Not on file  Tobacco Use  . Smoking status: Never Smoker  . Smokeless tobacco: Never Used  Substance and Sexual Activity  . Alcohol use: No  . Drug use: No  . Sexual activity: Not Currently    Birth control/protection: None, Post-menopausal  Lifestyle  . Physical activity    Days per week: Not on file    Minutes per session: Not on file  . Stress: Not on file  Relationships  . Social Herbalist on phone: Not on file    Gets together: Not on file    Attends religious service: Not on file    Active member of club or organization: Not on file    Attends meetings of clubs or organizations: Not on file    Relationship status: Not on file  . Intimate partner violence    Fear of current or ex partner: Not on file    Emotionally abused: Not on file    Physically abused: Not on file    Forced sexual activity: Not on file  Other Topics Concern  . 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       Family History  Problem Relation Age of Onset  . Hypertension Mother   . Arthritis Mother   . Heart disease Mother        afib  . Asthma Sister   . Asthma Daughter   . Arthritis Daughter   . Heart disease Daughter        ?heart  condition on BB  . ADD / ADHD Daughter   . Pancreatic cancer Father   . Cancer Father        pancreatitic   . Diabetes Maternal Grandmother   . Heart disease Maternal Grandmother   . Arthritis Maternal Grandmother   . Hypertension Maternal Grandmother   .  Colon cancer Maternal Uncle 15  . Crohn's disease Maternal Aunt   . Diabetes Maternal Aunt   . Breast cancer Cousin 56       maternal  . Cancer Cousin        m cousin breat cancer s/p removal both breasts     Allergies  Allergen Reactions  . Bee Venom Itching and Swelling    Affected area Affected area Affected area Affected area   . Fish Allergy Hives, Swelling and Rash    Facial swelling  Facial swelling  Facial swelling Facial swelling  Facial swelling   . Latex Anaphylaxis, Rash and Shortness Of Breath    Rash  Rash    . Prasterone Other (See Comments) and Nausea And Vomiting    rash Other reaction(s): Headache Headaches.  . Shellfish Allergy Hives, Other (See Comments), Rash and Swelling    Facial swelling Uncoded Allergy. Allergen: seafood Uncoded Allergy. Allergen: CATS, Other Reaction: itch, wheezing Uncoded Allergy. Allergen: COMPAZINE, Other Reaction: tremors Facial swelling Facial swelling Uncoded Allergy. Allergen: seafood Uncoded Allergy. Allergen: CATS, Other Reaction: itch, wheezing Uncoded Allergy. Allergen: COMPAZINE, Other Reaction: tremors Facial swelling Uncoded Allergy. Allergen: seafood Uncoded Allergy. Allergen: CATS, Other Reaction: itch, wheezing Uncoded Allergy. Allergen: COMPAZINE, Other Reaction: tremors Facial swelling Facial swelling Uncoded Allergy. Allergen: seafood Uncoded Allergy. Allergen: CATS, Other Reaction: itch, wheezing Uncoded Allergy. Allergen: COMPAZINE, Other Reaction: tremors Facial swelling Facial swelling Uncoded Allergy. Allergen: seafood Uncoded Allergy. Allergen: CATS, Other Reaction: itch, wheezing Uncoded Allergy. Allergen: COMPAZINE, Other  Reaction: tremors   . Shellfish-Derived Products Hives, Other (See Comments), Rash and Swelling    Facial swelling Uncoded Allergy. Allergen: seafood Uncoded Allergy. Allergen: CATS, Other Reaction: itch, wheezing Uncoded Allergy. Allergen: COMPAZINE, Other Reaction: tremors Facial swelling Facial swelling Uncoded Allergy. Allergen: seafood Uncoded Allergy. Allergen: CATS, Other Reaction: itch, wheezing Uncoded Allergy. Allergen: COMPAZINE, Other Reaction: tremors Facial swelling Uncoded Allergy. Allergen: seafood Uncoded Allergy. Allergen: CATS, Other Reaction: itch, wheezing Uncoded Allergy. Allergen: COMPAZINE, Other Reaction: tremors Facial swelling  . Compazine [Prochlorperazine Edisylate] Other (See Comments)    tremors  . Dhea [Nutritional Supplements] Other (See Comments)    Headaches.  . Dilaudid [Hydromorphone Hcl]     ? reaction  . Lactose   . Lactose Intolerance (Gi)   . Other Other (See Comments)    Other reaction(s): Unknown   . Prochlorperazine     Other reaction(s): Other (See Comments) ticks  . Promethazine     Other reaction(s): Other (See Comments) Ticks  . Promethazine Hcl Other (See Comments)    CNS disorder  . Reclast [Zoledronic Acid]     Weakness could not move limbs, fatigue, increase sleep  . Clarithromycin Hives and Rash  . Clotrimazole Swelling and Rash  . Hydromorphone Hcl Rash and Hives    "Rash all over"  . Penicillins Rash and Other (See Comments)    Has patient had a PCN reaction causing immediate rash, facial/tongue/throat swelling, SOB or lightheadedness with hypotension:No Has patient had a PCN reaction causing severe rash involving mucus membranes or skin necrosis:No Has patient had a PCN reaction that required hospitalization:No Has patient had a PCN reaction occurring within the last 10 years:No If all of the above answers are "NO", then may proceed with Cephalosporin use.     Review of Systems   Review of Systems: Negative  Unless Checked Constitutional: [] Weight loss  [] Fever  [] Chills Cardiac: [] Chest pain   []  Atrial Fibrillation  [] Palpitations   [] Shortness of breath when laying  flat   [] Shortness of breath with exertion. [] Shortness of breath at rest Vascular:  [] Pain in legs with walking   [] Pain in legs with standing [] Pain in legs when laying flat   [] Claudication    [] Pain in feet when laying flat    [] History of DVT   [] Phlebitis   [x] Swelling in legs   [] Varicose veins   [] Non-healing ulcers Pulmonary:   [] Uses home oxygen   [] Productive cough   [] Hemoptysis   [] Wheeze  [] COPD   [x] Asthma Neurologic:  [] Dizziness   [] Seizures  [] Blackouts [] History of stroke   [] History of TIA  [] Aphasia   [] Temporary Blindness   [] Weakness or numbness in arm   [] Weakness or numbness in leg Musculoskeletal:   [] Joint swelling   [] Joint pain   [] Low back pain  []  History of Knee Replacement [] Arthritis [] back Surgeries  []  Spinal Stenosis    Hematologic:  [] Easy bruising  [] Easy bleeding   [] Hypercoagulable state   [] Anemic Gastrointestinal:  [] Diarrhea   [] Vomiting  [] Gastroesophageal reflux/heartburn   [] Difficulty swallowing. [] Abdominal pain Genitourinary:  [] Chronic kidney disease   [] Difficult urination  [] Anuric   [] Blood in urine [] Frequent urination  [] Burning with urination   [] Hematuria Skin:  [] Rashes   [] Ulcers [] Wounds Psychological:  [x] History of anxiety   [x]  History of major depression  []  Memory Difficulties      OBJECTIVE:   Physical Exam  BP 132/70 (BP Location: Right Arm)   Pulse 89   Resp 18   Ht 5\' 10"  (1.778 m)   Wt 232 lb (105.2 kg)   LMP 01/02/2016 (Exact Date)   BMI 33.29 kg/m   Gen: WD/WN, NAD Head: [place]/AT, No temporalis wasting.  Ear/Nose/Throat: Hearing grossly intact, nares w/o erythema or drainage Eyes: PER, EOMI, sclera nonicteric.  Neck: Supple, no masses.  No JVD.  Pulmonary:  Good air movement, no use of accessory muscles.  Cardiac: RRR Vascular:  3+ bilateral hard edema.   Bilateral stasis dermatitis Vessel Right Left  Radial Palpable Palpable  Dorsalis Pedis Palpable Palpable  Posterior Tibial Palpable Palpable   Gastrointestinal: soft, non-distended. No guarding/no peritoneal signs.  Musculoskeletal: M/S 5/5 throughout.  No deformity or atrophy.  Neurologic: Pain and light touch intact in extremities.  Symmetrical.  Speech is fluent. Motor exam as listed above. Psychiatric: Judgment intact, Mood & affect appropriate for pt's clinical situation. Dermatologic: No Ulcers Noted.  No changes consistent with cellulitis. Lymph : No Cervical lymphadenopathy, no lichenification or dermal thickening bilaterally     ASSESSMENT AND PLAN:  1. Lymphedema Recommend:  No surgery or intervention at this point in time.    I have reviewed my previous discussion with the patient regarding swelling and why it causes symptoms.  Patient will continue wearing graduated compression stockings class 1 (20-30 mmHg) on a daily basis. The patient will  beginning wearing the stockings first thing in the morning and removing them in the evening. The patient is instructed specifically not to sleep in the stockings.    In addition, behavioral modification including several periods of elevation of the lower extremities during the day will be continued.  This was reviewed with the patient during the initial visit.  The patient will also continue routine exercise, especially walking on a daily basis as was discussed during the initial visit.    Despite conservative treatments including graduated compression therapy class 1 and behavioral modification including exercise and elevation the patient  has not obtained adequate control of the lymphedema.  The patient  still has stage 3 lymphedema and therefore, I believe that a lymph pump should be added to improve the control of the patient's lymphedema.  Additionally, a lymph pump is warranted because it will reduce the risk of cellulitis and  ulceration in the future.  In the meantime we will have the patient placed in bilateral Unna wraps in order to try to gain control of her swelling.  The patient has been wearing compression stockings on a daily basis however they are still not providing adequate control.  Patient will follow-up in 4 weeks for wound wrap evaluation   2. Moderate asthma, unspecified whether complicated, unspecified whether persistent Continue pulmonary medications and aerosols as already ordered, these medications have been reviewed and there are no changes at this time.    3. Gastroesophageal reflux disease without esophagitis Continue PPI as already ordered, this medication has been reviewed and there are no changes at this time.  Avoidence of caffeine and alcohol  Moderate elevation of the head of the bed    Current Outpatient Medications on File Prior to Visit  Medication Sig Dispense Refill  . albuterol (VENTOLIN HFA) 108 (90 Base) MCG/ACT inhaler Inhale 1-2 puffs into the lungs every 4 (four) hours as needed for wheezing or shortness of breath. 1 Inhaler 12  . ARIPiprazole (ABILIFY) 20 MG tablet Take 20 mg by mouth daily.    . Ascorbic Acid (VITAMIN C) 500 MG CHEW Chew 2,500 mg by mouth daily.    Marland Kitchen aspirin 325 MG tablet Take 1 tablet (325 mg total) by mouth daily. 30 tablet 0  . Biotin 10 MG CAPS Take by mouth.    . budesonide-formoterol (SYMBICORT) 160-4.5 MCG/ACT inhaler Symbicort 160 mcg-4.5 mcg/actuation HFA aerosol inhaler    . calcipotriene (DOVONOX) 0.005 % ointment calcipotriene 0.005 % topical ointment  APPLY A THIN LAYER TO THE AFFECTED AREA(S) BY TOPICAL ROUTE ONCE DAILY ; RUB IN GENTLY AND COMPLETELY    . Cholecalciferol (VITAMIN D3) 2000 units capsule Vitamin D3 2,000 unit capsule    . dapsone 25 MG tablet Take by mouth daily.     . diclofenac sodium (VOLTAREN) 1 % GEL Apply 2 g topically 4 (four) times daily as needed (for knee & finger pain.).     Marland Kitchen dicyclomine (BENTYL) 10 MG  capsule dicyclomine 10 mg capsule  every 8hours as needed    . doxycycline (VIBRA-TABS) 100 MG tablet Take 1 tablet (100 mg total) by mouth 2 (two) times daily. With food 14 tablet 0  . DULoxetine (CYMBALTA) 60 MG capsule Take 60 mg by mouth 2 (two) times daily.    Water engineer Bandages & Supports (MEDICAL COMPRESSION STOCKINGS) MISC Please provide 21mmHg compression stockings 1 each 0  . etodolac (LODINE) 500 MG tablet etodolac 500 mg tablet    . fentaNYL (DURAGESIC - DOSED MCG/HR) 75 MCG/HR Place 75 mcg onto the skin every 3 (three) days.    . Ferrous Sulfate (IRON PO) Take by mouth.    . fluocinonide ointment (LIDEX) 0.05 % fluocinonide 0.05 % topical ointment  APPLY TO THE AFFECTED AREA(S) BY TOPICAL ROUTE 2 TIMES PER DAY    . folic acid (FOLVITE) 1 MG tablet Take 1 tablet (1 mg total) by mouth daily. 90 tablet 3  . gabapentin (NEURONTIN) 100 MG capsule Take by mouth.    . hydrocortisone (PROCTOZONE-HC) 2.5 % rectal cream Proctozone-HC 2.5 % topical cream perineal applicator    . hydroxychloroquine (PLAQUENIL) 200 MG tablet Plaquenil 200 mg tablet  Take 1  tablet twice a day by oral route after meals for 90 days.    Marland Kitchen ibuprofen (ADVIL,MOTRIN) 800 MG tablet Take 1 tablet (800 mg total) by mouth every 8 (eight) hours as needed. 30 tablet 0  . levothyroxine (SYNTHROID, LEVOTHROID) 25 MCG tablet Take 1 tablet (25 mcg total) by mouth daily before breakfast. 90 tablet 3  . LORazepam (ATIVAN) 2 MG tablet Take 1-2 mg by mouth See admin instructions. 1 MG DAILY AS NEEDED FOR ANXIETY & SCHEDULED AT BEDTIME EACH NIGHT    . meclizine (ANTIVERT) 25 MG tablet Take 1 tablet (25 mg total) by mouth 3 (three) times daily as needed for dizziness or nausea. 20 tablet 0  . methylphenidate 18 MG PO CR tablet Take by mouth.     . montelukast (SINGULAIR) 10 MG tablet montelukast 10 mg tablet 1 pill qhs 90 tablet 3  . Multiple Vitamins-Minerals (MULTI-VITAMIN GUMMIES PO) Multi Vitamin  GUMMIES    . omeprazole  (PRILOSEC) 40 MG capsule Take 40 mg by mouth 2 (two) times daily.    . ondansetron (ZOFRAN) 4 MG tablet Zofran 8 mg tablet  Take 1 tablet every 8 hours by oral route as needed.    . pantoprazole (PROTONIX) 40 MG tablet Take one tablet by mouth twice daily (Patient taking differently: TAKE 1 TABLET (40 MG) BY MOUTH ONCE DAILY IN THE MORNING.) 180 tablet 3  . PARoxetine (PAXIL) 10 MG tablet Take 1 tablet (10 mg total) by mouth at bedtime. 30 tablet 11  . Polyvinyl Alcohol-Povidone (REFRESH OP) Place 1 drop into both eyes 2 (two) times daily.    . silver sulfADIAZINE (SILVADENE) 1 % cream silver sulfadiazine 1 % topical cream    . sucralfate (CARAFATE) 1 g tablet sucralfate 1 gram tablet    . terbinafine (LAMISIL AT) 1 % cream Apply 1 application topically 2 (two) times daily. 42 g 2  . topiramate (TOPAMAX) 100 MG tablet topiramate 100 mg tablet  TAKE 1 TABLET BY MOUTH 2 TIMES DAILY.    Marland Kitchen Biotin 5000 MCG SUBL Place 10,000 mcg under the tongue daily.    Marland Kitchen dexmethylphenidate (FOCALIN XR) 20 MG 24 hr capsule dexmethylphenidate ER 20 mg capsule,extended release biphasic50-50    . fluconazole (DIFLUCAN) 150 MG tablet Take 1 tablet (150 mg total) by mouth once a week. Repeat another dose in another week x 1 pill (over 2 weeks) (Patient not taking: Reported on 05/31/2019) 2 tablet 0  . furosemide (LASIX) 20 MG tablet Take 20 mg by mouth daily as needed.     . imiquimod (ALDARA) 5 % cream imiquimod 5 % topical cream packet  APPLY TO THE AFFECTED AREA(S) BY TOPICAL ROUTE 3 TIMES PER WEEK at bedtime    . isosorbide mononitrate (IMDUR) 60 MG 24 hr tablet Take 1 tablet (60 mg total) by mouth daily. (Patient not taking: Reported on 05/31/2019) 20 tablet 0  . polyethylene glycol powder (GLYCOLAX/MIRALAX) powder Take 17 g by mouth daily. (Patient not taking: Reported on 05/31/2019) 255 g 0  . predniSONE (DELTASONE) 10 MG tablet prednisone 10 mg tablet  1 daily    . Probiotic Product (PROBIOTIC PO) Probiotic 10 billion  cell capsule  daily    . Sodium Chloride 3 % AERS Saline Nasal Mist 3 %  spray nose deeply every hour or two and blow nose    . valACYclovir (VALTREX) 500 MG tablet valacyclovir 500 mg tablet  1 daily     No current facility-administered medications on file prior to  visit.     There are no Patient Instructions on file for this visit. Return in about 1 week (around 06/07/2019).   Kris Hartmann, NP  This note was completed with Sales executive.  Any errors are purely unintentional.

## 2019-06-05 ENCOUNTER — Ambulatory Visit (INDEPENDENT_AMBULATORY_CARE_PROVIDER_SITE_OTHER): Payer: Medicare Other | Admitting: Nurse Practitioner

## 2019-06-05 ENCOUNTER — Telehealth (INDEPENDENT_AMBULATORY_CARE_PROVIDER_SITE_OTHER): Payer: Self-pay

## 2019-06-05 VITALS — BP 154/81 | HR 98 | Resp 12 | Ht 70.0 in | Wt 231.0 lb

## 2019-06-05 DIAGNOSIS — L97919 Non-pressure chronic ulcer of unspecified part of right lower leg with unspecified severity: Secondary | ICD-10-CM | POA: Diagnosis not present

## 2019-06-05 DIAGNOSIS — I890 Lymphedema, not elsewhere classified: Secondary | ICD-10-CM

## 2019-06-05 DIAGNOSIS — L97929 Non-pressure chronic ulcer of unspecified part of left lower leg with unspecified severity: Secondary | ICD-10-CM

## 2019-06-05 NOTE — Telephone Encounter (Addendum)
Patient left a message stating that since her unna boots were placed from last visit that it has been wet around the heel area,squishy,and odor.I spoke with Eulogio Ditch NP and she advise for the patient to come in today for unna boot change.Patient has been schedule to come in for a nurse visit @1 :45

## 2019-06-05 NOTE — Progress Notes (Signed)
History of Present Illness  There is no documented history at this time  Assessments & Plan   There are no diagnoses linked to this encounter.    Additional instructions  Subjective:  Patient presents with venous ulcer of the Bilateral lower extremity.    Procedure:  3 layer unna wrap was placed Bilateral lower extremity.   Plan:   Follow up in one week.  

## 2019-06-07 ENCOUNTER — Encounter (INDEPENDENT_AMBULATORY_CARE_PROVIDER_SITE_OTHER): Payer: Medicare Other

## 2019-06-12 ENCOUNTER — Ambulatory Visit: Payer: Medicare Other | Admitting: Dietician

## 2019-06-14 ENCOUNTER — Other Ambulatory Visit: Payer: Self-pay

## 2019-06-14 ENCOUNTER — Ambulatory Visit (INDEPENDENT_AMBULATORY_CARE_PROVIDER_SITE_OTHER): Payer: Medicare Other | Admitting: Nurse Practitioner

## 2019-06-14 VITALS — BP 131/72 | HR 92 | Resp 18 | Ht 70.0 in | Wt 227.0 lb

## 2019-06-14 DIAGNOSIS — L97919 Non-pressure chronic ulcer of unspecified part of right lower leg with unspecified severity: Secondary | ICD-10-CM

## 2019-06-14 DIAGNOSIS — I890 Lymphedema, not elsewhere classified: Secondary | ICD-10-CM | POA: Diagnosis not present

## 2019-06-14 DIAGNOSIS — L97929 Non-pressure chronic ulcer of unspecified part of left lower leg with unspecified severity: Secondary | ICD-10-CM

## 2019-06-14 NOTE — Progress Notes (Signed)
History of Present Illness  There is no documented history at this time  Assessments & Plan   There are no diagnoses linked to this encounter.    Additional instructions  Subjective:  Patient presents with venous ulcer of the Bilateral lower extremity.    Procedure:  3 layer unna wrap was placed Bilateral lower extremity.   Plan:   Follow up in one week.  

## 2019-06-15 ENCOUNTER — Ambulatory Visit: Payer: Medicare Other | Admitting: Internal Medicine

## 2019-06-19 ENCOUNTER — Encounter (INDEPENDENT_AMBULATORY_CARE_PROVIDER_SITE_OTHER): Payer: Medicare Other

## 2019-06-21 ENCOUNTER — Other Ambulatory Visit: Payer: Self-pay

## 2019-06-21 ENCOUNTER — Encounter (INDEPENDENT_AMBULATORY_CARE_PROVIDER_SITE_OTHER): Payer: Self-pay

## 2019-06-21 ENCOUNTER — Ambulatory Visit (INDEPENDENT_AMBULATORY_CARE_PROVIDER_SITE_OTHER): Payer: Medicare Other | Admitting: Vascular Surgery

## 2019-06-21 VITALS — BP 125/73 | HR 97 | Resp 17 | Ht 70.0 in | Wt 226.0 lb

## 2019-06-21 DIAGNOSIS — L97919 Non-pressure chronic ulcer of unspecified part of right lower leg with unspecified severity: Secondary | ICD-10-CM

## 2019-06-21 DIAGNOSIS — I872 Venous insufficiency (chronic) (peripheral): Secondary | ICD-10-CM | POA: Diagnosis not present

## 2019-06-21 DIAGNOSIS — L97929 Non-pressure chronic ulcer of unspecified part of left lower leg with unspecified severity: Secondary | ICD-10-CM | POA: Diagnosis not present

## 2019-06-21 DIAGNOSIS — I890 Lymphedema, not elsewhere classified: Secondary | ICD-10-CM | POA: Diagnosis not present

## 2019-06-21 NOTE — Progress Notes (Signed)
History of Present Illness  There is no documented history at this time  Assessments & Plan   There are no diagnoses linked to this encounter.    Additional instructions  Subjective:  Patient presents with venous ulcer of the Bilateral lower extremity.    Procedure:  3 layer unna wrap was placed Bilateral lower extremity.   Plan:   Follow up in one week.  

## 2019-06-26 ENCOUNTER — Telehealth (INDEPENDENT_AMBULATORY_CARE_PROVIDER_SITE_OTHER): Payer: Self-pay

## 2019-06-26 ENCOUNTER — Encounter (INDEPENDENT_AMBULATORY_CARE_PROVIDER_SITE_OTHER): Payer: Self-pay | Admitting: Nurse Practitioner

## 2019-06-26 ENCOUNTER — Other Ambulatory Visit: Payer: Self-pay

## 2019-06-26 ENCOUNTER — Ambulatory Visit (INDEPENDENT_AMBULATORY_CARE_PROVIDER_SITE_OTHER): Payer: Medicare Other | Admitting: Nurse Practitioner

## 2019-06-26 VITALS — BP 118/68 | HR 87 | Resp 16 | Wt 228.8 lb

## 2019-06-26 DIAGNOSIS — J45909 Unspecified asthma, uncomplicated: Secondary | ICD-10-CM | POA: Diagnosis not present

## 2019-06-26 DIAGNOSIS — I890 Lymphedema, not elsewhere classified: Secondary | ICD-10-CM

## 2019-06-26 NOTE — Progress Notes (Signed)
SUBJECTIVE:  [name], female    DOB: 07/28/1966, 53 y.o.   MRN: [id number] Chief Complaint  Patient presents with  . Follow-up    unna check    HPI  [name] [name] is a 53 y.o. female The patient returns to the office for followup evaluation regarding leg swelling.  The swelling has improved quite a bit and the pain associated with swelling has decreased substantially. There have not been any interval development of a ulcerations or wounds.  Since the previous visit the patient has underwent 4 weeks of unna wrap therapy and has noted improvement in the lymphedema, however swelling is still persistent. She will transition to medical grade compression stockings.    The patient also states elevation during the day and exercise is being done too.     Past Medical History:  Diagnosis Date  . ADHD   . Anemia   . ANXIETY 06/06/2007  . Arthritis    related to lupus  . Asthma   . BACK PAIN 02/22/2009  . Cervical cancer ([place])    LEEP 2005   . Chicken pox   . Chronic pain    goes to pain management provider  . Collagen vascular disease ([place])   . Connective tissue disease ([place])   . DEPRESSION 06/06/2007  . Essential tremor   . Fibromyalgia   . Fibromyalgia   . FREQUENCY, URINARY 07/07/2007  . Gastritis   . GERD 06/06/2007  . Glaucoma   . Headache    h/o migraines   . History of hiatal hernia   . History of kidney stones   . HYPOTHYROIDISM 06/06/2007  . INTERSTITIAL CYSTITIS 09/11/2010  . Lupus ([place]) 2006  . Lupus ([place])   . Menopause   . Migraine   . Mitral valve regurgitation   . NEPHROLITHIASIS, HX OF 11/05/2010  . Neuropathy   . Neuropathy   . OCD (obsessive compulsive disorder)   . Optic neuritis    2005  . OSTEOPENIA 10/14/2009  . Osteoporosis   . OVERACTIVE BLADDER 10/14/2009  . PAIN, CHRONIC NEC 06/06/2007  . Panniculitis   . POLYARTHRITIS 08/19/2007  . Raynaud's disease   . Sicca syndrome ([place])   . UNSPECIFIED OPTIC NEURITIS  11/08/2007  . Vaginal prolapse    Dr. Marcelline Mates [facility]   . Vasculitis ([place])   . WEIGHT GAIN 02/22/2009    Past Surgical History:  Procedure Laterality Date  . APPENDECTOMY    . CERVICAL BIOPSY  W/ LOOP ELECTRODE EXCISION  2005  . CHOLECYSTECTOMY    . ESOPHAGOGASTRODUODENOSCOPY (EGD) WITH PROPOFOL N/A 08/27/2017   Procedure: ESOPHAGOGASTRODUODENOSCOPY (EGD) WITH PROPOFOL;  Surgeon: Manya Silvas, MD;  Location: [facility] ENDOSCOPY;  Service: Endoscopy;  Laterality: N/A;  . HAND SURGERY     repair of left metatarsal   . HARDWARE REMOVAL Right 02/16/2017   Procedure: HARDWARE REMOVAL FROM HIP;  Surgeon: Hessie Knows, MD;  Location: [facility] ORS;  Service: Orthopedics;  Laterality: Right;  . JOINT REPLACEMENT    . OTHER SURGICAL HISTORY     left 3rd metatarsal fracture repair 2011   . REVISION TOTAL HIP ARTHROPLASTY  06/2009   replacement 2010 then screw and plate removal 4496; right hip  . TONSILLECTOMY    . TONSILLECTOMY    . wrist  ligament repair bilateral    . WRIST RECONSTRUCTION    . WRIST SURGERY     laceration of right wrist ligaments  . wrist surgery     laceration of left wrist  surgery     Social History   Socioeconomic History  . Marital status: Single    Spouse name: Not on file  . Number of children: 1  . Years of education: Not on file  . Highest education level: Not on file  Occupational History  . Occupation: DISABLED    Employer: UNEMPLOYED  Social Needs  . Financial resource strain: Not on file  . Food insecurity    Worry: Not on file    Inability: Not on file  . Transportation needs    Medical: Not on file    Non-medical: Not on file  Tobacco Use  . Smoking status: Never Smoker  . Smokeless tobacco: Never Used  Substance and Sexual Activity  . Alcohol use: No  . Drug use: No  . Sexual activity: Not Currently    Birth control/protection: None, Post-menopausal  Lifestyle  . Physical activity    Days per week: Not on file    Minutes per session: Not  on file  . Stress: Not on file  Relationships  . Social Herbalist on phone: Not on file    Gets together: Not on file    Attends religious service: Not on file    Active member of club or organization: Not on file    Attends meetings of clubs or organizations: Not on file    Relationship status: Not on file  . Intimate partner violence    Fear of current or ex partner: Not on file    Emotionally abused: Not on file    Physically abused: Not on file    Forced sexual activity: Not on file  Other Topics Concern  . 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       Family History  Problem Relation Age of Onset  . Hypertension Mother   . Arthritis Mother   . Heart disease Mother        afib  . Asthma Sister   . Asthma Daughter   . Arthritis Daughter   . Heart disease Daughter        ?heart condition on BB  . ADD / ADHD Daughter   . Pancreatic cancer Father   . Cancer Father        pancreatitic   . Diabetes Maternal Grandmother   . Heart disease Maternal Grandmother   . Arthritis Maternal Grandmother   . Hypertension Maternal Grandmother   . Colon cancer Maternal Uncle 46  . Crohn's disease Maternal Aunt   . Diabetes Maternal Aunt   . Breast cancer Cousin 21       maternal  . Cancer Cousin        m cousin breat cancer s/p removal both breasts     Allergies  Allergen Reactions  . Bee Venom Itching and Swelling    Affected area Affected area Affected area Affected area   . Fish Allergy Hives, Swelling and Rash    Facial swelling  Facial swelling  Facial swelling Facial swelling  Facial swelling   . Latex Anaphylaxis, Rash and Shortness Of Breath    Rash  Rash    . Prasterone Other (See Comments) and Nausea And Vomiting    rash Other reaction(s): Headache Headaches.  . Shellfish Allergy Hives, Other (See  Comments), Rash and Swelling    Facial swelling  Uncoded Allergy. Allergen: seafood Uncoded Allergy. Allergen: CATS, Other Reaction: itch, wheezing Uncoded Allergy. Allergen: COMPAZINE, Other Reaction: tremors Facial swelling Facial swelling Uncoded Allergy. Allergen: seafood Uncoded Allergy. Allergen: CATS, Other Reaction: itch, wheezing Uncoded Allergy. Allergen: COMPAZINE, Other Reaction: tremors Facial swelling Uncoded Allergy. Allergen: seafood Uncoded Allergy. Allergen: CATS, Other Reaction: itch, wheezing Uncoded Allergy. Allergen: COMPAZINE, Other Reaction: tremors Facial swelling Facial swelling Uncoded Allergy. Allergen: seafood Uncoded Allergy. Allergen: CATS, Other Reaction: itch, wheezing Uncoded Allergy. Allergen: COMPAZINE, Other Reaction: tremors Facial swelling Facial swelling Uncoded Allergy. Allergen: seafood Uncoded Allergy. Allergen: CATS, Other Reaction: itch, wheezing Uncoded Allergy. Allergen: COMPAZINE, Other Reaction: tremors   . Shellfish-Derived Products Hives, Other (See Comments), Rash and Swelling    Facial swelling Uncoded Allergy. Allergen: seafood Uncoded Allergy. Allergen: CATS, Other Reaction: itch, wheezing Uncoded Allergy. Allergen: COMPAZINE, Other Reaction: tremors Facial swelling Facial swelling Uncoded Allergy. Allergen: seafood Uncoded Allergy. Allergen: CATS, Other Reaction: itch, wheezing Uncoded Allergy. Allergen: COMPAZINE, Other Reaction: tremors Facial swelling Uncoded Allergy. Allergen: seafood Uncoded Allergy. Allergen: CATS, Other Reaction: itch, wheezing Uncoded Allergy. Allergen: COMPAZINE, Other Reaction: tremors Facial swelling  . Compazine [Prochlorperazine Edisylate] Other (See Comments)    tremors  . Dhea [Nutritional Supplements] Other (See Comments)    Headaches.  . Dilaudid [Hydromorphone Hcl]     ? reaction  . Lactose   . Lactose Intolerance ([place])   . Other Other (See Comments)    Other reaction(s): Unknown    . Prochlorperazine     Other reaction(s): Other (See Comments) ticks  . Promethazine     Other reaction(s): Other (See Comments) Ticks  . Promethazine Hcl Other (See Comments)    CNS disorder  . Reclast [Zoledronic Acid]     Weakness could not move limbs, fatigue, increase sleep  . Clarithromycin Hives and Rash  . Clotrimazole Swelling and Rash  . Hydromorphone Hcl Rash and Hives    "Rash all over"  . Penicillins Rash and Other (See Comments)    Has patient had a PCN reaction causing immediate rash, facial/tongue/throat swelling, SOB or lightheadedness with hypotension:No Has patient had a PCN reaction causing severe rash involving mucus membranes or skin necrosis:No Has patient had a PCN reaction that required hospitalization:No Has patient had a PCN reaction occurring within the last 10 years:No If all of the above answers are "NO", then may proceed with Cephalosporin use.     Review of Systems   Review of Systems: Negative Unless Checked Constitutional: [] Weight loss  [] Fever  [] Chills Cardiac: [] Chest pain   []  Atrial Fibrillation  [] Palpitations   [] Shortness of breath when laying flat   [] Shortness of breath with exertion. [] Shortness of breath at rest Vascular:  [] Pain in legs with walking   [] Pain in legs with standing [] Pain in legs when laying flat   [] Claudication    [] Pain in feet when laying flat    [] History of DVT   [] Phlebitis   [x] Swelling in legs   [] Varicose veins   [] Non-healing ulcers Pulmonary:   [] Uses home oxygen   [] Productive cough   [] Hemoptysis   [] Wheeze  [] COPD   [x] Asthma Neurologic:  [] Dizziness   [] Seizures  [] Blackouts [] History of stroke   [] History of TIA  [] Aphasia   [] Temporary Blindness   [] Weakness or numbness in arm   [x] Weakness or numbness in leg Musculoskeletal:   [] Joint swelling   [] Joint pain   [] Low back pain  []  History of Knee Replacement [] Arthritis [] back Surgeries  []  Spinal Stenosis  Hematologic:  [] Easy bruising  [] Easy  bleeding   [] Hypercoagulable state   [] Anemic Gastrointestinal:  [] Diarrhea   [] Vomiting  [x] Gastroesophageal reflux/heartburn   [] Difficulty swallowing. [] Abdominal pain Genitourinary:  [] Chronic kidney disease   [] Difficult urination  [] Anuric   [] Blood in urine [] Frequent urination  [] Burning with urination   [] Hematuria Skin:  [x] Rashes   [] Ulcers [] Wounds Psychological:  [] History of anxiety   [x]  History of major depression  []  Memory Difficulties      OBJECTIVE:   Physical Exam  BP 118/68 (BP Location: Right Arm)   Pulse 87   Resp 16   Wt 228 lb 12.8 oz (103.8 kg)   LMP 01/02/2016 (Exact Date)   BMI 32.83 kg/m   Gen: WD/WN, NAD Head: [place]/AT, No temporalis wasting.  Ear/Nose/Throat: Hearing grossly intact, nares w/o erythema or drainage Eyes: PER, EOMI, sclera nonicteric.  Neck: Supple, no masses.  No JVD.  Pulmonary:  Good air movement, no use of accessory muscles.  Cardiac: RRR Vascular: 2+ non pitting edema  Vessel Right Left  Radial Palpable Palpable  Dorsalis Pedis Palpable Palpable  Posterior Tibial Palpable Palpable   Gastrointestinal: soft, non-distended. No guarding/no peritoneal signs.  Musculoskeletal: M/S 5/5 throughout.  No deformity or atrophy. Uses cane for ambulation  Neurologic: Pain and light touch intact in extremities.  Symmetrical.  Speech is fluent. Motor exam as listed above.Tremors Psychiatric: Judgment intact, Mood & affect appropriate for pt's clinical situation. Dermatologic: bilateral stasis dermatitis No Ulcers Noted.  No changes consistent with cellulitis. Lymph : No Cervical lymphadenopathy, no lichenification or skin changes of chronic lymphedema.       ASSESSMENT AND PLAN:  1. Lymphedema Recommend:  No surgery or intervention at this point in time.    I have reviewed my previous discussion with the patient regarding swelling and why it causes symptoms.  Patient will continue wearing graduated compression stockings class 1 (20-30  mmHg) on a daily basis. The patient will  beginning wearing the stockings first thing in the morning and removing them in the evening. The patient is instructed specifically not to sleep in the stockings.    In addition, behavioral modification including several periods of elevation of the lower extremities during the day will be continued.  This was reviewed with the patient during the initial visit.  The patient will also continue routine exercise, especially walking on a daily basis as was discussed during the initial visit.    Despite conservative treatments including graduated compression therapy class 1 and behavioral modification including exercise and elevation the patient  has not obtained adequate control of the lymphedema.  The patient still has stage 3 lymphedema and therefore, I believe that a lymph pump should be added to improve the control of the patient's lymphedema.  Additionally, a lymph pump is warranted because it will reduce the risk of cellulitis and ulceration in the future.  Patient should follow-up in six months    2. Moderate asthma, unspecified whether complicated, unspecified whether persistent Continue pulmonary medications and aerosols as already ordered, these medications have been reviewed and there are no changes at this time.     Current Outpatient Medications on File Prior to Visit  Medication Sig Dispense Refill  . albuterol (VENTOLIN HFA) 108 (90 Base) MCG/ACT inhaler Inhale 1-2 puffs into the lungs every 4 (four) hours as needed for wheezing or shortness of breath. 1 Inhaler 12  . ARIPiprazole (ABILIFY) 20 MG tablet Take 20 mg by mouth daily.    . Ascorbic Acid (  VITAMIN C) 500 MG CHEW Chew 2,500 mg by mouth daily.    Marland Kitchen aspirin 325 MG tablet Take 1 tablet (325 mg total) by mouth daily. 30 tablet 0  . Biotin 10 MG CAPS Take by mouth.    . Biotin 5000 MCG SUBL Place 10,000 mcg under the tongue daily.    . budesonide-formoterol (SYMBICORT) 160-4.5 MCG/ACT  inhaler Symbicort 160 mcg-4.5 mcg/actuation HFA aerosol inhaler    . calcipotriene (DOVONOX) 0.005 % ointment calcipotriene 0.005 % topical ointment  APPLY A THIN LAYER TO THE AFFECTED AREA(S) BY TOPICAL ROUTE ONCE DAILY ; RUB IN GENTLY AND COMPLETELY    . Cholecalciferol (VITAMIN D3) 2000 units capsule Vitamin D3 2,000 unit capsule    . dapsone 25 MG tablet Take by mouth daily.     Marland Kitchen dexmethylphenidate (FOCALIN XR) 20 MG 24 hr capsule dexmethylphenidate ER 20 mg capsule,extended release biphasic50-50    . diclofenac sodium (VOLTAREN) 1 % GEL Apply 2 g topically 4 (four) times daily as needed (for knee & finger pain.).     Marland Kitchen dicyclomine (BENTYL) 10 MG capsule dicyclomine 10 mg capsule  every 8hours as needed    . doxycycline (VIBRA-TABS) 100 MG tablet Take 1 tablet (100 mg total) by mouth 2 (two) times daily. With food 14 tablet 0  . DULoxetine (CYMBALTA) 60 MG capsule Take 60 mg by mouth 2 (two) times daily.    Water engineer Bandages & Supports (MEDICAL COMPRESSION STOCKINGS) MISC Please provide 42mmHg compression stockings 1 each 0  . etodolac (LODINE) 500 MG tablet etodolac 500 mg tablet    . fentaNYL (DURAGESIC - DOSED MCG/HR) 75 MCG/HR Place 75 mcg onto the skin every 3 (three) days.    . Ferrous Sulfate (IRON PO) Take by mouth.    . fluconazole (DIFLUCAN) 150 MG tablet Take 1 tablet (150 mg total) by mouth once a week. Repeat another dose in another week x 1 pill (over 2 weeks) 2 tablet 0  . fluocinonide ointment (LIDEX) 0.05 % fluocinonide 0.05 % topical ointment  APPLY TO THE AFFECTED AREA(S) BY TOPICAL ROUTE 2 TIMES PER DAY    . folic acid (FOLVITE) 1 MG tablet Take 1 tablet (1 mg total) by mouth daily. 90 tablet 3  . furosemide (LASIX) 20 MG tablet Take 20 mg by mouth daily as needed.     . gabapentin (NEURONTIN) 100 MG capsule Take by mouth.    . hydrocortisone (PROCTOZONE-HC) 2.5 % rectal cream Proctozone-HC 2.5 % topical cream perineal applicator    . hydroxychloroquine (PLAQUENIL) 200  MG tablet Plaquenil 200 mg tablet  Take 1 tablet twice a day by oral route after meals for 90 days.    Marland Kitchen ibuprofen (ADVIL,MOTRIN) 800 MG tablet Take 1 tablet (800 mg total) by mouth every 8 (eight) hours as needed. 30 tablet 0  . imiquimod (ALDARA) 5 % cream imiquimod 5 % topical cream packet  APPLY TO THE AFFECTED AREA(S) BY TOPICAL ROUTE 3 TIMES PER WEEK at bedtime    . isosorbide mononitrate (IMDUR) 60 MG 24 hr tablet Take 1 tablet (60 mg total) by mouth daily. 20 tablet 0  . levothyroxine (SYNTHROID, LEVOTHROID) 25 MCG tablet Take 1 tablet (25 mcg total) by mouth daily before breakfast. 90 tablet 3  . LORazepam (ATIVAN) 2 MG tablet Take 1-2 mg by mouth See admin instructions. 1 MG DAILY AS NEEDED FOR ANXIETY & SCHEDULED AT BEDTIME EACH NIGHT    . meclizine (ANTIVERT) 25 MG tablet Take 1 tablet (25 mg  total) by mouth 3 (three) times daily as needed for dizziness or nausea. 20 tablet 0  . methylphenidate 18 MG PO CR tablet Take by mouth.     . montelukast (SINGULAIR) 10 MG tablet montelukast 10 mg tablet 1 pill qhs 90 tablet 3  . Multiple Vitamins-Minerals (MULTI-VITAMIN GUMMIES PO) Multi Vitamin  GUMMIES    . omeprazole (PRILOSEC) 40 MG capsule Take 40 mg by mouth 2 (two) times daily.    . ondansetron (ZOFRAN) 4 MG tablet Zofran 8 mg tablet  Take 1 tablet every 8 hours by oral route as needed.    . pantoprazole (PROTONIX) 40 MG tablet Take one tablet by mouth twice daily (Patient taking differently: TAKE 1 TABLET (40 MG) BY MOUTH ONCE DAILY IN THE MORNING.) 180 tablet 3  . PARoxetine (PAXIL) 10 MG tablet Take 1 tablet (10 mg total) by mouth at bedtime. 30 tablet 11  . polyethylene glycol powder (GLYCOLAX/MIRALAX) powder Take 17 g by mouth daily. 255 g 0  . Polyvinyl Alcohol-Povidone (REFRESH OP) Place 1 drop into both eyes 2 (two) times daily.    . predniSONE (DELTASONE) 10 MG tablet prednisone 10 mg tablet  1 daily    . Probiotic Product (PROBIOTIC PO) Probiotic 10 billion cell capsule   daily    . silver sulfADIAZINE (SILVADENE) 1 % cream silver sulfadiazine 1 % topical cream    . Sodium Chloride 3 % AERS Saline Nasal Mist 3 %  spray nose deeply every hour or two and blow nose    . sucralfate (CARAFATE) 1 g tablet sucralfate 1 gram tablet    . terbinafine (LAMISIL AT) 1 % cream Apply 1 application topically 2 (two) times daily. 42 g 2  . topiramate (TOPAMAX) 100 MG tablet topiramate 100 mg tablet  TAKE 1 TABLET BY MOUTH 2 TIMES DAILY.    . valACYclovir (VALTREX) 500 MG tablet valacyclovir 500 mg tablet  1 daily     No current facility-administered medications on file prior to visit.     There are no Patient Instructions on file for this visit. Return in about 6 months (around 12/27/2019) for Lymphedema.   Kris Hartmann, NP  This note was completed with Sales executive.  Any errors are purely unintentional.

## 2019-06-26 NOTE — Telephone Encounter (Signed)
Patient left a voicemail stating that she was not able to get her compression stockings on because her feet are swollen. I spoke with Eulogio Ditch NP and she advise for the patient to retry in the morning and also to elevated to decrease swelling,also if the patient start having pain,swelling and not able to tolerate the compression stockings she will have to go back into unna wraps.Patient has been informed with information and verbalize understanding.

## 2019-07-04 ENCOUNTER — Encounter: Payer: Self-pay | Admitting: Obstetrics and Gynecology

## 2019-07-04 ENCOUNTER — Ambulatory Visit (INDEPENDENT_AMBULATORY_CARE_PROVIDER_SITE_OTHER): Payer: Medicare Other | Admitting: Obstetrics and Gynecology

## 2019-07-04 ENCOUNTER — Other Ambulatory Visit: Payer: Self-pay

## 2019-07-04 VITALS — BP 127/67 | HR 90 | Ht 70.0 in | Wt 223.6 lb

## 2019-07-04 DIAGNOSIS — M328 Other forms of systemic lupus erythematosus: Secondary | ICD-10-CM

## 2019-07-04 DIAGNOSIS — G20A1 Parkinson's disease without dyskinesia, without mention of fluctuations: Secondary | ICD-10-CM

## 2019-07-04 DIAGNOSIS — M8580 Other specified disorders of bone density and structure, unspecified site: Secondary | ICD-10-CM

## 2019-07-04 DIAGNOSIS — D649 Anemia, unspecified: Secondary | ICD-10-CM

## 2019-07-04 DIAGNOSIS — N951 Menopausal and female climacteric states: Secondary | ICD-10-CM | POA: Diagnosis not present

## 2019-07-04 DIAGNOSIS — G20 Parkinson's disease: Secondary | ICD-10-CM

## 2019-07-04 DIAGNOSIS — E559 Vitamin D deficiency, unspecified: Secondary | ICD-10-CM

## 2019-07-04 DIAGNOSIS — Z01419 Encounter for gynecological examination (general) (routine) without abnormal findings: Secondary | ICD-10-CM

## 2019-07-04 DIAGNOSIS — Z1231 Encounter for screening mammogram for malignant neoplasm of breast: Secondary | ICD-10-CM

## 2019-07-04 DIAGNOSIS — G219 Secondary parkinsonism, unspecified: Secondary | ICD-10-CM | POA: Insufficient documentation

## 2019-07-04 MED ORDER — PAROXETINE HCL 10 MG PO TABS: 10.0000 mg | ORAL_TABLET | Freq: Every day | ORAL | 3 refills | Status: DC

## 2019-07-04 NOTE — Patient Instructions (Signed)

## 2019-07-04 NOTE — Progress Notes (Signed)
ANNUAL PREVENTATIVE CARE GYNECOLOGY  ENCOUNTER NOTE  Subjective:       [name] [name] is a 53 y.o. G1P1 female with h/o lupus erythematous here for a routine annual gynecologic exam. The patient is not sexually active. The patient has never been taking hormone replacement therapy. Patient denies post-menopausal vaginal bleeding. The patient wears seatbelts: yes. The patient participates in regular exercise: no. Has the patient ever been transfused or tattooed?: no. The patient reports that there is not currently domestic violence in her life. Does have h/o in the past with ex-husband.   Current complaints: 1.  She notes she is currently having a lupus flare. Notes swelling in legs, joint pain. Is being managed by her Rheumatologist. 2. Patient reports a cousin who recently passed from a brain tumor, and a maternal aunt and another cousin who both have been diagnosed with breast cancer. Is now concerned regarding her cancer risk.  3. She reports that she was recently diagnosed with Parkinson's disease.     Gynecologic History Patient's last menstrual period was 01/02/2016 (exact date). Contraception: menopausal Last Pap: 06/2018. Results were normal:  Last mammogram: 05/2018. Results were: normal Last Colonoscopy: 2018.  Results were: benign polyps. Needs repeat in 5 years.  Last Dexa Scan: Never had one   Obstetric History OB History  Gravida Para Term Preterm AB Living  1 1       1   SAB TAB Ectopic Multiple Live Births          1    # Outcome Date GA Lbr Len/2nd Weight Sex Delivery Anes PTL Lv  1 Para 06/19/98    F Vag-Spont   LIV    Past Medical History:  Diagnosis Date  . ADHD   . Anemia   . ANXIETY 06/06/2007  . Arthritis    related to lupus  . Asthma   . BACK PAIN 02/22/2009  . Cervical cancer ([place])    LEEP 2005   . Chicken pox   . Chronic pain    goes to pain management provider  . Collagen vascular disease ([place])   . Connective tissue disease ([place])   .  DEPRESSION 06/06/2007  . Essential tremor   . Fibromyalgia   . Fibromyalgia   . FREQUENCY, URINARY 07/07/2007  . Gastritis   . GERD 06/06/2007  . Glaucoma   . Headache    h/o migraines   . History of hiatal hernia   . History of kidney stones   . HYPOTHYROIDISM 06/06/2007  . INTERSTITIAL CYSTITIS 09/11/2010  . Lupus ([place]) 2006  . Lupus ([place])   . Menopause   . Migraine   . Mitral valve regurgitation   . NEPHROLITHIASIS, HX OF 11/05/2010  . Neuropathy   . Neuropathy   . OCD (obsessive compulsive disorder)   . Optic neuritis    2005  . OSTEOPENIA 10/14/2009  . Osteoporosis   . OVERACTIVE BLADDER 10/14/2009  . PAIN, CHRONIC NEC 06/06/2007  . Panniculitis   . Parkinson disease ([place]) 04/03/2019  . POLYARTHRITIS 08/19/2007  . Raynaud's disease   . Sicca syndrome ([place])   . 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 Vaginal prolapse    Dr. Marcelline Mates [facility]   . Vasculitis ([place])   . WEIGHT GAIN 02/22/2009    Family History  Problem Relation Age of Onset  . Hypertension Mother   . Arthritis Mother   . Heart disease Mother        afib  . Asthma Sister   . Asthma  Daughter   . Arthritis Daughter   . Heart disease Daughter        ?heart condition on BB  . ADD / ADHD Daughter   . Pancreatic cancer Father   . Cancer Father        pancreatitic   . Diabetes Maternal Grandmother   . Heart disease Maternal Grandmother   . Arthritis Maternal Grandmother   . Hypertension Maternal Grandmother   . Colon cancer Maternal Uncle 91  . Crohn's disease Maternal Aunt   . Diabetes Maternal Aunt   . Breast cancer Cousin 59       maternal  . Cancer Cousin        m cousin breat cancer s/p removal both breasts     Past Surgical History:  Procedure Laterality Date  . APPENDECTOMY    . CERVICAL BIOPSY  W/ LOOP ELECTRODE EXCISION  2005  . CHOLECYSTECTOMY    . ESOPHAGOGASTRODUODENOSCOPY (EGD) WITH PROPOFOL N/A 08/27/2017   Procedure: ESOPHAGOGASTRODUODENOSCOPY (EGD) WITH PROPOFOL;  Surgeon: Manya Silvas, MD;  Location[facility] ENDOSCOPY;  Service: Endoscopy;  Laterality: N/A;  . HAND SURGERY     repair of left metatarsal   . HARDWARE REMOVAL Right 02/16/2017   Procedure: HARDWARE REMOVAL FROM HIP;  Surgeon: Hessie Knows, MD;  Location: [facility] ORS;  Service: Orthopedics;  Laterality: Right;  . JOINT REPLACEMENT    . OTHER SURGICAL HISTORY     left 3rd metatarsal fracture repair 2011   . REVISION TOTAL HIP ARTHROPLASTY  06/2009   replacement 2010 then screw and plate removal 1610; right hip  . TONSILLECTOMY    . TONSILLECTOMY    . wrist  ligament repair bilateral    . WRIST RECONSTRUCTION    . WRIST SURGERY     laceration of right wrist ligaments  . wrist surgery     laceration of left wrist surgery     Social History   Socioeconomic History  . Marital status: Single    Spouse name: Not on file  . Number of children: 1  . Years of education: Not on file  . Highest education level: Not on file  Occupational History  . Occupation: DISABLED    Employer: UNEMPLOYED  Social Needs  . Financial resource strain: Not on file  . Food insecurity    Worry: Not on file    Inability: Not on file  . Transportation needs    Medical: Not on file    Non-medical: Not on file  Tobacco Use  . Smoking status: Never Smoker  . Smokeless tobacco: Never Used  Substance and Sexual Activity  . Alcohol use: No  . Drug use: No  . Sexual activity: Not Currently    Birth control/protection: None, Post-menopausal  Lifestyle  . Physical activity    Days per week: Not on file    Minutes per session: Not on file  . Stress: Not on file  Relationships  . Social Herbalist on phone: Not on file    Gets together: Not on file    Attends religious service: Not on file    Active member of club or organization: Not on file    Attends meetings of clubs or organizations: Not on file    Relationship status: Not on file  . Intimate partner violence    Fear of current or ex partner: Not on  file    Emotionally abused: Not on file    Physically abused: Not on file  Forced sexual activity: Not on file  Other Topics Concern  . 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       Current Outpatient Medications on File Prior to Visit  Medication Sig Dispense Refill  . albuterol (VENTOLIN HFA) 108 (90 Base) MCG/ACT inhaler Inhale 1-2 puffs into the lungs every 4 (four) hours as needed for wheezing or shortness of breath. 1 Inhaler 12  . ARIPiprazole (ABILIFY) 20 MG tablet Take 20 mg by mouth daily.    . Ascorbic Acid (VITAMIN C) 500 MG CHEW Chew 2,500 mg by mouth daily.    Marland Kitchen aspirin 325 MG tablet Take 1 tablet (325 mg total) by mouth daily. 30 tablet 0  . Biotin 10 MG CAPS Take by mouth.    . Biotin 5000 MCG SUBL Place 10,000 mcg under the tongue daily.    . budesonide-formoterol (SYMBICORT) 160-4.5 MCG/ACT inhaler Symbicort 160 mcg-4.5 mcg/actuation HFA aerosol inhaler    . calcipotriene (DOVONOX) 0.005 % ointment calcipotriene 0.005 % topical ointment  APPLY A THIN LAYER TO THE AFFECTED AREA(S) BY TOPICAL ROUTE ONCE DAILY ; RUB IN GENTLY AND COMPLETELY    . Cholecalciferol (VITAMIN D3) 2000 units capsule Vitamin D3 2,000 unit capsule    . dapsone 25 MG tablet Take by mouth daily.     Marland Kitchen dexmethylphenidate (FOCALIN XR) 20 MG 24 hr capsule dexmethylphenidate ER 20 mg capsule,extended release biphasic50-50    . diclofenac sodium (VOLTAREN) 1 % GEL Apply 2 g topically 4 (four) times daily as needed (for knee & finger pain.).     Marland Kitchen dicyclomine (BENTYL) 10 MG capsule dicyclomine 10 mg capsule  every 8hours as needed    . doxycycline (VIBRA-TABS) 100 MG tablet Take 1 tablet (100 mg total) by mouth 2 (two) times daily. With food 14 tablet 0  . DULoxetine (CYMBALTA) 60 MG capsule Take 60 mg by mouth 2 (two) times daily.    Water engineer Bandages & Supports  (MEDICAL COMPRESSION STOCKINGS) MISC Please provide 56mmHg compression stockings 1 each 0  . etodolac (LODINE) 500 MG tablet etodolac 500 mg tablet    . fentaNYL (DURAGESIC - DOSED MCG/HR) 75 MCG/HR Place 75 mcg onto the skin every 3 (three) days.    . Ferrous Sulfate (IRON PO) Take by mouth.    . fluconazole (DIFLUCAN) 150 MG tablet Take 1 tablet (150 mg total) by mouth once a week. Repeat another dose in another week x 1 pill (over 2 weeks) 2 tablet 0  . fluocinonide ointment (LIDEX) 0.05 % fluocinonide 0.05 % topical ointment  APPLY TO THE AFFECTED AREA(S) BY TOPICAL ROUTE 2 TIMES PER DAY    . folic acid (FOLVITE) 1 MG tablet Take 1 tablet (1 mg total) by mouth daily. 90 tablet 3  . furosemide (LASIX) 20 MG tablet Take 20 mg by mouth daily as needed.     . gabapentin (NEURONTIN) 100 MG capsule Take by mouth.    . hydrocortisone (PROCTOZONE-HC) 2.5 % rectal cream Proctozone-HC 2.5 % topical cream perineal applicator    . hydroxychloroquine (PLAQUENIL) 200 MG tablet Plaquenil 200 mg tablet  Take 1 tablet twice a day by oral route after meals for 90 days.    Marland Kitchen ibuprofen (ADVIL,MOTRIN) 800 MG tablet Take 1 tablet (800 mg total) by mouth every 8 (eight) hours as needed. 30 tablet 0  .  imiquimod (ALDARA) 5 % cream imiquimod 5 % topical cream packet  APPLY TO THE AFFECTED AREA(S) BY TOPICAL ROUTE 3 TIMES PER WEEK at bedtime    . isosorbide mononitrate (IMDUR) 60 MG 24 hr tablet Take 1 tablet (60 mg total) by mouth daily. 20 tablet 0  . levothyroxine (SYNTHROID, LEVOTHROID) 25 MCG tablet Take 1 tablet (25 mcg total) by mouth daily before breakfast. 90 tablet 3  . LORazepam (ATIVAN) 2 MG tablet Take 1-2 mg by mouth See admin instructions. 1 MG DAILY AS NEEDED FOR ANXIETY & SCHEDULED AT BEDTIME EACH NIGHT    . meclizine (ANTIVERT) 25 MG tablet Take 1 tablet (25 mg total) by mouth 3 (three) times daily as needed for dizziness or nausea. 20 tablet 0  . methylphenidate 18 MG PO CR tablet Take by mouth.      . montelukast (SINGULAIR) 10 MG tablet montelukast 10 mg tablet 1 pill qhs 90 tablet 3  . Multiple Vitamins-Minerals (MULTI-VITAMIN GUMMIES PO) Multi Vitamin  GUMMIES    . omeprazole (PRILOSEC) 40 MG capsule Take 40 mg by mouth 2 (two) times daily.    . ondansetron (ZOFRAN) 4 MG tablet Zofran 8 mg tablet  Take 1 tablet every 8 hours by oral route as needed.    . pantoprazole (PROTONIX) 40 MG tablet Take one tablet by mouth twice daily (Patient taking differently: TAKE 1 TABLET (40 MG) BY MOUTH ONCE DAILY IN THE MORNING.) 180 tablet 3  . PARoxetine (PAXIL) 10 MG tablet Take 1 tablet (10 mg total) by mouth at bedtime. 30 tablet 11  . polyethylene glycol powder (GLYCOLAX/MIRALAX) powder Take 17 g by mouth daily. 255 g 0  . Polyvinyl Alcohol-Povidone (REFRESH OP) Place 1 drop into both eyes 2 (two) times daily.    . predniSONE (DELTASONE) 10 MG tablet prednisone 10 mg tablet  1 daily    . Probiotic Product (PROBIOTIC PO) Probiotic 10 billion cell capsule  daily    . silver sulfADIAZINE (SILVADENE) 1 % cream silver sulfadiazine 1 % topical cream    . Sodium Chloride 3 % AERS Saline Nasal Mist 3 %  spray nose deeply every hour or two and blow nose    . sucralfate (CARAFATE) 1 g tablet sucralfate 1 gram tablet    . terbinafine (LAMISIL AT) 1 % cream Apply 1 application topically 2 (two) times daily. 42 g 2  . topiramate (TOPAMAX) 100 MG tablet topiramate 100 mg tablet  TAKE 1 TABLET BY MOUTH 2 TIMES DAILY.    . valACYclovir (VALTREX) 500 MG tablet valacyclovir 500 mg tablet  1 daily     No current facility-administered medications on file prior to visit.     Allergies  Allergen Reactions  . Bee Venom Itching and Swelling    Affected area Affected area Affected area Affected area   . Fish Allergy Hives, Swelling and Rash    Facial swelling  Facial swelling  Facial swelling Facial swelling  Facial swelling   . Latex Anaphylaxis, Rash and Shortness Of Breath    Rash  Rash     . Prasterone Other (See Comments) and Nausea And Vomiting    rash Other reaction(s): Headache Headaches.  . Shellfish Allergy Hives, Other (See Comments), Rash and Swelling    Facial swelling Uncoded Allergy. Allergen: seafood Uncoded Allergy. Allergen: CATS, Other Reaction: itch, wheezing Uncoded Allergy. Allergen: COMPAZINE, Other Reaction: tremors Facial swelling Facial swelling Uncoded Allergy. Allergen: seafood Uncoded Allergy. Allergen: CATS, Other Reaction: itch, wheezing Uncoded Allergy. Allergen: COMPAZINE,  Other Reaction: tremors Facial swelling Uncoded Allergy. Allergen: seafood Uncoded Allergy. Allergen: CATS, Other Reaction: itch, wheezing Uncoded Allergy. Allergen: COMPAZINE, Other Reaction: tremors Facial swelling Facial swelling Uncoded Allergy. Allergen: seafood Uncoded Allergy. Allergen: CATS, Other Reaction: itch, wheezing Uncoded Allergy. Allergen: COMPAZINE, Other Reaction: tremors Facial swelling Facial swelling Uncoded Allergy. Allergen: seafood Uncoded Allergy. Allergen: CATS, Other Reaction: itch, wheezing Uncoded Allergy. Allergen: COMPAZINE, Other Reaction: tremors   . Shellfish-Derived Products Hives, Other (See Comments), Rash and Swelling    Facial swelling Uncoded Allergy. Allergen: seafood Uncoded Allergy. Allergen: CATS, Other Reaction: itch, wheezing Uncoded Allergy. Allergen: COMPAZINE, Other Reaction: tremors Facial swelling Facial swelling Uncoded Allergy. Allergen: seafood Uncoded Allergy. Allergen: CATS, Other Reaction: itch, wheezing Uncoded Allergy. Allergen: COMPAZINE, Other Reaction: tremors Facial swelling Uncoded Allergy. Allergen: seafood Uncoded Allergy. Allergen: CATS, Other Reaction: itch, wheezing Uncoded Allergy. Allergen: COMPAZINE, Other Reaction: tremors Facial swelling  . Compazine [Prochlorperazine Edisylate] Other (See Comments)    tremors  . Dhea [Nutritional Supplements] Other (See Comments)    Headaches.  .  Dilaudid [Hydromorphone Hcl]     ? reaction  . Lactose   . Lactose Intolerance ([place])   . Other Other (See Comments)    Other reaction(s): Unknown   . Prochlorperazine     Other reaction(s): Other (See Comments) ticks  . Promethazine     Other reaction(s): Other (See Comments) Ticks  . Promethazine Hcl Other (See Comments)    CNS disorder  . Reclast [Zoledronic Acid]     Weakness could not move limbs, fatigue, increase sleep  . Clarithromycin Hives and Rash  . Clotrimazole Swelling and Rash  . Hydromorphone Hcl Rash and Hives    "Rash all over"  . Penicillins Rash and Other (See Comments)    Has patient had a PCN reaction causing immediate rash, facial/tongue/throat swelling, SOB or lightheadedness with hypotension:No Has patient had a PCN reaction causing severe rash involving mucus membranes or skin necrosis:No Has patient had a PCN reaction that required hospitalization:No Has patient had a PCN reaction occurring within the last 10 years:No If all of the above answers are "NO", then may proceed with Cephalosporin use.      Review of Systems ROS Review of Systems - General ROS: negative for - chills, fatigue, fever, night sweats, weight gain or weight loss. Hot flashes managed with Paroxetine.  Psychological ROS: negative for - anxiety, decreased libido, depression, mood swings, physical abuse or sexual abuse Ophthalmic ROS: negative for - blurry vision, eye pain or loss of vision ENT ROS: negative for - headaches, hearing change, visual changes or vocal changes Allergy and Immunology ROS: negative for - hives, itchy/watery eyes or seasonal allergies Hematological and Lymphatic ROS: negative for - bleeding problems, bruising, swollen lymph nodes or weight loss Endocrine ROS: negative for - galactorrhea, hot flashes, malaise/lethargy, mood swings, palpitations, polydipsia/polyuria, skin changes, temperature intolerance or unexpected weight changes.  Breast ROS: negative for -  new or changing breast lumps or nipple discharge Respiratory ROS: negative for - cough or shortness of breath Cardiovascular ROS: negative for - chest pain, irregular heartbeat, palpitations or shortness of breath Gastrointestinal ROS: no abdominal pain, change in bowel habits, or black or bloody stools Genito-Urinary ROS: no dysuria, trouble voiding, or hematuria Musculoskeletal ROS: negative for - joint pain or joint stiffness Neurological ROS: negative for - bowel and bladder control changes Dermatological ROS: negative for rash and skin lesion changes   Objective:   BP 127/67   Pulse 90   Ht 5\' 10"  (  1.778 m)   Wt 223 lb 9.6 oz (101.4 kg)   LMP 01/02/2016 (Exact Date)   BMI 32.08 kg/m  CONSTITUTIONAL: Well-developed, well-nourished female in no acute distress. Mild obesity PSYCHIATRIC: Normal mood and affect. Normal behavior. Normal judgment and thought content. [place]: Alert and oriented to person, place, and time. No cranial nerve deficit noted.  Mild body tremor present.  HENT:  Normocephalic, atraumatic, External right and left ear normal. Oropharynx is clear and moist EYES: Conjunctivae and EOM are normal. Pupils are equal, round, and reactive to light. No scleral icterus.  NECK: Normal range of motion, supple, no masses.  Normal thyroid.  SKIN: Skin is warm and dry. No rash noted. Not diaphoretic. No erythema. No pallor. CARDIOVASCULAR: Normal heart rate noted, regular rhythm, no murmur. RESPIRATORY: Clear to auscultation bilaterally. Effort and breath sounds normal, no problems with respiration noted. BREASTS: Symmetric in size. No masses, skin changes, nipple drainage, or lymphadenopathy. ABDOMEN: Soft, normal bowel sounds, no distention noted.  No tenderness, rebound or guarding.  BLADDER: Normal PELVIC:  Bladder no bladder distension noted  Urethra: normal appearing urethra with no masses, tenderness or lesions  Vulva: normal appearing vulva with no masses, tenderness  or lesions  Vagina: atrophic and Pelvic Floor Exam no cystocele, rectocele or prolapse noted, cystocele mild, present  Cervix: normal appearing cervix without discharge or lesions  Uterus: uterus is normal size, shape, consistency and nontender  Adnexa: normal adnexa in size, nontender and no masses  RV: External Exam NormaI, No Rectal Masses and Normal Sphincter tone  MUSCULOSKELETAL: Normal range of motion. No tenderness.  No cyanosis, clubbing, or edema.  2+ distal pulses. LYMPHATIC: No Axillary, Supraclavicular, or Inguinal Adenopathy.   Labs: Lab Results  Component Value Date   WBC 4.1 04/08/2019   HGB 13.1 04/08/2019   HCT 41.6 04/08/2019   MCV 89.8 04/08/2019   PLT 178 04/08/2019    Lab Results  Component Value Date   CREATININE 1.01 (H) 04/08/2019   BUN 15 04/08/2019   NA 143 04/08/2019   K 3.7 04/08/2019   CL 105 04/08/2019   CO2 28 04/08/2019    Lab Results  Component Value Date   ALT 15 04/07/2019   AST 28 04/07/2019   ALKPHOS 78 04/07/2019   BILITOT 0.6 04/07/2019    Lab Results  Component Value Date   CHOL 176 07/06/2018   HDL 74.80 07/06/2018   LDLCALC 88 07/06/2018   TRIG 68.0 07/06/2018   CHOLHDL 2 07/06/2018    Lab Results  Component Value Date   TSH 3.824 02/09/2019    Lab Results  Component Value Date   HGBA1C 5.3 07/06/2018     Assessment:   Encounter for well woman exam with routine gynecological exam.  Breast cancer screening by mammogram Parkinson disease ([place]) Vasomotor symptoms due to menopause Other forms of systemic lupus erythematosus, unspecified organ involvement status ([place]) Osteopenia, unspecified location Vitamin D deficiency Anemia, unspecified type  Plan:  - Pap: Pap Co Test  Up to date.  - Mammogram: Up to date.  Continue routine screening - Stool Guaiac Testing:  Not Indicated. Patient has had recent colonoscopy last year, repeat due in another 3 years.  - Labs: No new labs. Labs performed with PCP.   -  Routine preventative health maintenance measures emphasized: Exercise/Diet/Weight control and Stress Management - Given refill on Paxil for management of vasomotor symptoms. Cautioned on concomitant use of NSAIDs due to increased risk of GI bleed.  - Anemia, unspecified type.  Patient currently on iron supplements.  - Vitamin D deficiency, patient currently on supplementation.  - Parkinson's and Lupus managed by respective specialists.  - Osteopenia, patient recently received Reclast several months ago. Receives dosing annually.  - Discussion had with patient regarding concerns for family history of cancer.  - Discussed option of hereditary testing to assess potential risk. Advised on self-papy cost in case not covered by insurance. Patient thinks she may have this performed. Given handout on information.  Return to [place], MD [facility] Care

## 2019-07-04 NOTE — Progress Notes (Signed)
Pt is present today for annual exam. Pt stated that she was doing well other than having joint pain.

## 2019-07-10 ENCOUNTER — Other Ambulatory Visit: Payer: Self-pay

## 2019-07-10 ENCOUNTER — Encounter: Payer: Medicare Other | Attending: Internal Medicine | Admitting: Dietician

## 2019-07-10 ENCOUNTER — Encounter: Payer: Self-pay | Admitting: Dietician

## 2019-07-10 VITALS — Wt 226.9 lb

## 2019-07-10 DIAGNOSIS — R7303 Prediabetes: Secondary | ICD-10-CM | POA: Diagnosis not present

## 2019-07-10 DIAGNOSIS — E669 Obesity, unspecified: Secondary | ICD-10-CM | POA: Diagnosis not present

## 2019-07-10 DIAGNOSIS — K760 Fatty (change of) liver, not elsewhere classified: Secondary | ICD-10-CM | POA: Insufficient documentation

## 2019-07-10 DIAGNOSIS — Z6832 Body mass index (BMI) 32.0-32.9, adult: Secondary | ICD-10-CM | POA: Insufficient documentation

## 2019-07-10 DIAGNOSIS — E739 Lactose intolerance, unspecified: Secondary | ICD-10-CM | POA: Insufficient documentation

## 2019-07-10 DIAGNOSIS — Z713 Dietary counseling and surveillance: Secondary | ICD-10-CM | POA: Insufficient documentation

## 2019-07-10 DIAGNOSIS — E6609 Other obesity due to excess calories: Secondary | ICD-10-CM

## 2019-07-10 NOTE — Progress Notes (Signed)
Medical Nutrition Therapy Follow-up visit:  Time with patient: 1330-1415 Visit #: 2 ASSESSMENT:  Diagnosis: fatty liver, obesity, pre-diabetes  Current weight: 226.9 lbs   Height: 70 in Medications: See list Medical History: lupus, hypothyroidism, GERD, migraines Progress and evaluation: Patient in for medical nutrition follow-up appointment. She reports that she has made some diet changes since her initial visit. She states she has talked with her mother about need for her (patient) to control portions as her mother usually cooks and portions out her plate and she states her portions are smaller. She reports that she tried to eat a granola bar at breakfast but felt sick so she continues to eat Frosted flakes and almond milk. She is eating a peanut butter/jelly sandwich at lunch rather than waffles. She states that she continues to include some meat at her dinner meal (she had considered a vegetarian diet) but it must be chopped up and on a sandwich or small pieces such as popcorn chicken or vienna sausages. She will also eat "rolled up" ham on an entree salad along with [place] and salami.  She states she has eaten more vegetables and usually includes a vegetable at her dinner meal.  She does not eat fruit daily but has tried mandarin oranges and has eaten some pineapple. Her main beverage is water and she drinks orange juice at least once daily. Her weight today is 2 lbs less than her weight at initial visit 2 months ago.  Physical activity:She states she is walking her dog 3 x daily for 20 minutes. She tries to walk further than when she first started. (She does this while walking with a cane.)   NUTRITION CARE EDUCATION: Weight management/prediabetes:  Commended on positive changes made with her diet and exercise. Used food guide plate to review meal pattern that balances protein, starch portions, fruits and non-starchy vegetables. Discussed ways to better balance her typical meals and  also gave and reviewed "Quick and Healthy Meals" with suggestions for balanced but simple meals. Stressed again need for adequate protein and importance of fruits and vegetables for nutrients but also as anti-inflammatory foods. Fatty Liver: Discussed ways to decrease fat in her diet in addition to the meal plan guidelines given for weight management and prediabetes.  INTERVENTION:  Continue with previous goals: Balance meals with 2-3 oz protein (14-21 gms), 2-3 servings of carbohydrate (30-45 gms) and at lunch and dinner, add a non-starchy vegetable. Work in fruit as part of the carbohydrate. Read labels for carbohydrate as well as saturated fat. Limit saturated fat to no more than 12 gms per day. Increase overall intake of fruits and vegetables with a goal of at least 5 servings daily.  In addition: Use food guide plate as a guide Add a fruit or vegetable or both with sandwich at lunch. Mix plain cheerios with Frosted Flakes to decrease sugar content. Limit juice to 4 oz portion. Include 1/4 cup nuts as a protein source. Continue to include a protein source at lunch and dinner. Refer to "Quick and Healthy Meals" hand out for more ideas for meals that are balanced.  EDUCATION MATERIALS GIVEN:  . Plate Planner . "Quick and Healthy Meals" . Goals/ instructions LEARNER/ who was taught:  . Patient  LEVEL OF UNDERSTANDING: . Verbalized understanding Demonstrated degree of understanding via teach back.  LEARNING BARRIERS: . None  WILLINGNESS TO LEARN/READINESS FOR CHANGE: . Change in progress  MONITORING AND EVALUATION:  Diet, exercise, weight Follow-up: 08/01/19 at 1:30pm

## 2019-07-10 NOTE — Patient Instructions (Addendum)
Continue with previous goals: Balance meals with 2-3 oz protein (14-21 gms), 2-3 servings of carbohydrate (30-45 gms) and at lunch and dinner, add a non-starchy vegetable. Work in fruit as part of the carbohydrate. Read labels for carbohydrate as well as saturated fat. Limit saturated fat to no more than 12 gms per day. Increase overall intake of fruits and vegetables with a goal of at least 5 servings daily.  In addition: Use food guide plate as a guide Add a fruit or vegetable or both with sandwich at lunch. Mix plain cheerios with Frosted Flakes to decrease sugar content. Limit juice to 4 oz portion. Include 1/4 cup nuts as a protein source. Continue to include a protein source at lunch and dinner. Refer to "Quick and Healthy Meals" hand out for more ideas for meals that are balanced.

## 2019-07-20 ENCOUNTER — Ambulatory Visit
Admission: RE | Admit: 2019-07-20 | Discharge: 2019-07-20 | Disposition: A | Discharge status: Home / Self Care | Payer: Medicare Other | Source: Ambulatory Visit | Attending: Internal Medicine | Admitting: Internal Medicine

## 2019-07-20 ENCOUNTER — Other Ambulatory Visit: Payer: Self-pay

## 2019-07-20 ENCOUNTER — Ambulatory Visit (INDEPENDENT_AMBULATORY_CARE_PROVIDER_SITE_OTHER): Payer: Medicare Other | Admitting: Internal Medicine

## 2019-07-20 ENCOUNTER — Encounter: Payer: Self-pay | Admitting: Internal Medicine

## 2019-07-20 ENCOUNTER — Ambulatory Visit (INDEPENDENT_AMBULATORY_CARE_PROVIDER_SITE_OTHER): Payer: Medicare Other | Admitting: Vascular Surgery

## 2019-07-20 ENCOUNTER — Telehealth: Payer: Self-pay

## 2019-07-20 VITALS — Ht 70.0 in | Wt 223.0 lb

## 2019-07-20 DIAGNOSIS — Z1283 Encounter for screening for malignant neoplasm of skin: Secondary | ICD-10-CM

## 2019-07-20 DIAGNOSIS — R0781 Pleurodynia: Secondary | ICD-10-CM

## 2019-07-20 DIAGNOSIS — B353 Tinea pedis: Secondary | ICD-10-CM | POA: Diagnosis not present

## 2019-07-20 DIAGNOSIS — M25521 Pain in right elbow: Secondary | ICD-10-CM

## 2019-07-20 DIAGNOSIS — J4540 Moderate persistent asthma, uncomplicated: Secondary | ICD-10-CM

## 2019-07-20 DIAGNOSIS — R208 Other disturbances of skin sensation: Secondary | ICD-10-CM

## 2019-07-20 NOTE — Telephone Encounter (Signed)
Tiffany w/ [facility] called in report from patient Xray results:  RIGHT RIBS:    FINDINGS: No fracture or other bone lesions are seen involving the ribs.  IMPRESSION: Negative.   RIGHT ELBOW:  FINDINGS: There is no evidence of fracture, dislocation, or joint effusion. There is no evidence of arthropathy or other focal bone abnormality. Mild soft tissue swelling overlying the olecranon.  IMPRESSION: 1. No acute osseous abnormality. 2. Mild soft tissue swelling of the posterior elbow, nonspecific but can be seen in the setting of bursitis.  Results are in [place].

## 2019-07-20 NOTE — Progress Notes (Signed)
Virtual Visit via Video Note  I connected with [name] [name]  on 07/20/19 at 10:00 AM EDT by a video enabled telemedicine application and verified that I am speaking with the correct person using two identifiers.  Location patient: home Location provider:work or home office Persons participating in the virtual visit: patient, provider  I discussed the limitations of evaluation and management by telemedicine and the availability of in person appointments. The patient expressed understanding and agreed to proceed.   HPI: 1. C/o right rib pain 7th rib pain chronic since fall and wants to know if rib is still fractured. She is on chronic pain medications and still having pain  2. C/o right elbow pain 1 month ago her moms elbow hit her elbow and having pain with ROM 7-8/10 no swelling. Nothing tried  3. Asthma not controlled having sob after walking up 3 flights of stairs to get to her home by 2nd floor has sob and wheezing and only using singulair and prn Albuterol she has been on flovent in the past but does not want to try this wants to try something else  4. C/o burning/tingling in her feet she is not sure if residual tinea pedis or if related to neuropathy  Neurology is Dr. Manuella Ghazi and disc with pt may need NCS/EMS of lower ext    ROS: See pertinent positives and negatives per HPI.  Past Medical History:  Diagnosis Date  . ADHD   . Anemia   . ANXIETY 06/06/2007  . Arthritis    related to lupus  . Asthma   . BACK PAIN 02/22/2009  . Cervical cancer ([place])    LEEP 2005   . Chicken pox   . Chronic pain    goes to pain management provider  . Collagen vascular disease ([place])   . Connective tissue disease ([place])   . DEPRESSION 06/06/2007  . Essential tremor   . Fibromyalgia   . Fibromyalgia   . FREQUENCY, URINARY 07/07/2007  . Gastritis   . GERD 06/06/2007  . Glaucoma   . Headache    h/o migraines   . History of hiatal hernia   . History of kidney stones   . HYPOTHYROIDISM 06/06/2007   . INTERSTITIAL CYSTITIS 09/11/2010  . Lupus ([place]) 2006  . Lupus ([place])   . Menopause   . Migraine   . Mitral valve regurgitation   . NEPHROLITHIASIS, HX OF 11/05/2010  . Neuropathy   . Neuropathy   . OCD (obsessive compulsive disorder)   . Optic neuritis    2005  . OSTEOPENIA 10/14/2009  . Osteoporosis   . OVERACTIVE BLADDER 10/14/2009  . PAIN, CHRONIC NEC 06/06/2007  . Panniculitis   . Parkinson disease ([place]) 04/03/2019  . POLYARTHRITIS 08/19/2007  . Raynaud's disease   . Sicca syndrome ([place])   . 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 Vaginal prolapse    Dr. Marcelline Mates [facility]   . Vasculitis ([place])   . WEIGHT GAIN 02/22/2009    Past Surgical History:  Procedure Laterality Date  . APPENDECTOMY    . CERVICAL BIOPSY  W/ LOOP ELECTRODE EXCISION  2005  . CHOLECYSTECTOMY    . ESOPHAGOGASTRODUODENOSCOPY (EGD) WITH PROPOFOL N/A 08/27/2017   Procedure: ESOPHAGOGASTRODUODENOSCOPY (EGD) WITH PROPOFOL;  Surgeon: Manya Silvas, MD;  Location: [facility];  Service: Endoscopy;  Laterality: N/A;  . HAND SURGERY     repair of left metatarsal   . HARDWARE REMOVAL Right 02/16/2017   Procedure: HARDWARE REMOVAL FROM HIP;  Surgeon: Hessie Knows, MD;  Location: [facility];  Service: Orthopedics;  Laterality: Right;  . JOINT REPLACEMENT    . OTHER SURGICAL HISTORY     left 3rd metatarsal fracture repair 2011   . REVISION TOTAL HIP ARTHROPLASTY  06/2009   replacement 2010 then screw and plate removal 9147; right hip  . TONSILLECTOMY    . TONSILLECTOMY    . wrist  ligament repair bilateral    . WRIST RECONSTRUCTION    . WRIST SURGERY     laceration of right wrist ligaments  . wrist surgery     laceration of left wrist surgery     Family History  Problem Relation Age of Onset  . Hypertension Mother   . Arthritis Mother   . Heart disease Mother        afib  . Asthma Sister   . Asthma Daughter   . Arthritis Daughter   . Heart disease Daughter        ?heart condition on BB  . ADD /  ADHD Daughter   . Pancreatic cancer Father   . Cancer Father        pancreatitic   . Diabetes Maternal Grandmother   . Heart disease Maternal Grandmother   . Arthritis Maternal Grandmother   . Hypertension Maternal Grandmother   . Colon cancer Maternal Uncle 14  . Crohn's disease Maternal Aunt   . Diabetes Maternal Aunt   . Breast cancer Cousin 8       maternal  . Cancer Cousin        m cousin breat cancer s/p removal both breasts     SOCIAL HX: lives with mom and daughter    Current Outpatient Medications:  .  albuterol (VENTOLIN HFA) 108 (90 Base) MCG/ACT inhaler, Inhale 1-2 puffs into the lungs every 4 (four) hours as needed for wheezing or shortness of breath., Disp: 1 Inhaler, Rfl: 12 .  ARIPiprazole (ABILIFY) 20 MG tablet, Take 20 mg by mouth daily., Disp: , Rfl:  .  Ascorbic Acid (VITAMIN C) 500 MG CHEW, Chew 2,500 mg by mouth daily., Disp: , Rfl:  .  aspirin 325 MG tablet, Take 1 tablet (325 mg total) by mouth daily., Disp: 30 tablet, Rfl: 0 .  Biotin 10 MG CAPS, Take by mouth., Disp: , Rfl:  .  Biotin 5000 MCG SUBL, Place 10,000 mcg under the tongue daily., Disp: , Rfl:  .  budesonide-formoterol (SYMBICORT) 160-4.5 MCG/ACT inhaler, Symbicort 160 mcg-4.5 mcg/actuation HFA aerosol inhaler, Disp: , Rfl:  .  calcipotriene (DOVONOX) 0.005 % ointment, calcipotriene 0.005 % topical ointment  APPLY A THIN LAYER TO THE AFFECTED AREA(S) BY TOPICAL ROUTE ONCE DAILY ; RUB IN GENTLY AND COMPLETELY, Disp: , Rfl:  .  Cholecalciferol (VITAMIN D3) 2000 units capsule, Vitamin D3 2,000 unit capsule, Disp: , Rfl:  .  dapsone 25 MG tablet, Take by mouth daily. , Disp: , Rfl:  .  dexmethylphenidate (FOCALIN XR) 20 MG 24 hr capsule, dexmethylphenidate ER 20 mg capsule,extended release biphasic50-50, Disp: , Rfl:  .  diclofenac sodium (VOLTAREN) 1 % GEL, Apply 2 g topically 4 (four) times daily as needed (for knee & finger pain.). , Disp: , Rfl:  .  dicyclomine (BENTYL) 10 MG capsule, dicyclomine  10 mg capsule  every 8hours as  needed, Disp: , Rfl:  .  doxycycline (VIBRA-TABS) 100 MG tablet, Take 1 tablet (100 mg total) by mouth 2 (two) times daily. With food, Disp: 14 tablet, Rfl: 0 .  DULoxetine (CYMBALTA) 60 MG capsule, Take  60 mg by mouth 2 (two) times daily., Disp: , Rfl:  .  Elastic Bandages & Supports (MEDICAL COMPRESSION STOCKINGS) MISC, Please provide 60mHg compression stockings, Disp: 1 each, Rfl: 0 .  fentaNYL (DURAGESIC - DOSED MCG/HR) 75 MCG/HR, Place 75 mcg onto the skin every 3 (three) days., Disp: , Rfl:  .  Ferrous Sulfate (IRON PO), Take by mouth., Disp: , Rfl:  .  fluconazole (DIFLUCAN) 150 MG tablet, Take 1 tablet (150 mg total) by mouth once a week. Repeat another dose in another week x 1 pill (over 2 weeks), Disp: 2 tablet, Rfl: 0 .  fluocinonide ointment (LIDEX) 0.05 %, fluocinonide 0.05 % topical ointment  APPLY TO THE AFFECTED AREA(S) BY TOPICAL ROUTE 2 TIMES PER DAY, Disp: , Rfl:  .  folic acid (FOLVITE) 1 MG tablet, Take 1 tablet (1 mg total) by mouth daily., Disp: 90 tablet, Rfl: 3 .  furosemide (LASIX) 20 MG tablet, Take 20 mg by mouth daily as needed. , Disp: , Rfl:  .  gabapentin (NEURONTIN) 100 MG capsule, Take by mouth., Disp: , Rfl:  .  hydrocortisone (PROCTOZONE-HC) 2.5 % rectal cream, Proctozone-HC 2.5 % topical cream perineal applicator, Disp: , Rfl:  .  hydroxychloroquine (PLAQUENIL) 200 MG tablet, Plaquenil 200 mg tablet  Take 1 tablet twice a day by oral route after meals for 90 days., Disp: , Rfl:  .  ibuprofen (ADVIL,MOTRIN) 800 MG tablet, Take 1 tablet (800 mg total) by mouth every 8 (eight) hours as needed., Disp: 30 tablet, Rfl: 0 .  imiquimod (ALDARA) 5 % cream, imiquimod 5 % topical cream packet  APPLY TO THE AFFECTED AREA(S) BY TOPICAL ROUTE 3 TIMES PER WEEK at bedtime, Disp: , Rfl:  .  isosorbide mononitrate (IMDUR) 60 MG 24 hr tablet, Take 1 tablet (60 mg total) by mouth daily., Disp: 20 tablet, Rfl: 0 .  levothyroxine (SYNTHROID,  LEVOTHROID) 25 MCG tablet, Take 1 tablet (25 mcg total) by mouth daily before breakfast., Disp: 90 tablet, Rfl: 3 .  LORazepam (ATIVAN) 2 MG tablet, Take 1-2 mg by mouth See admin instructions. 1 MG DAILY AS NEEDED FOR ANXIETY & SCHEDULED AT BEDTIME EACH NIGHT, Disp: , Rfl:  .  meclizine (ANTIVERT) 25 MG tablet, Take 1 tablet (25 mg total) by mouth 3 (three) times daily as needed for dizziness or nausea., Disp: 20 tablet, Rfl: 0 .  methylphenidate 18 MG PO CR tablet, Take by mouth. , Disp: , Rfl:  .  montelukast (SINGULAIR) 10 MG tablet, montelukast 10 mg tablet 1 pill qhs, Disp: 90 tablet, Rfl: 3 .  Multiple Vitamins-Minerals (MULTI-VITAMIN GUMMIES PO), Multi Vitamin  GUMMIES, Disp: , Rfl:  .  omeprazole (PRILOSEC) 40 MG capsule, Take 40 mg by mouth 2 (two) times daily., Disp: , Rfl:  .  ondansetron (ZOFRAN) 4 MG tablet, Zofran 8 mg tablet  Take 1 tablet every 8 hours by oral route as needed., Disp: , Rfl:  .  pantoprazole (PROTONIX) 40 MG tablet, Take one tablet by mouth twice daily (Patient taking differently: TAKE 1 TABLET (40 MG) BY MOUTH ONCE DAILY IN THE MORNING.), Disp: 180 tablet, Rfl: 3 .  PARoxetine (PAXIL) 10 MG tablet, Take 1 tablet (10 mg total) by mouth at bedtime., Disp: 90 tablet, Rfl: 3 .  polyethylene glycol powder (GLYCOLAX/MIRALAX) powder, Take 17 g by mouth daily., Disp: 255 g, Rfl: 0 .  Polyvinyl Alcohol-Povidone (REFRESH OP), Place 1 drop into both eyes 2 (two) times daily., Disp: , Rfl:  .  predniSONE (  DELTASONE) 10 MG tablet, prednisone 10 mg tablet  1 daily, Disp: , Rfl:  .  Probiotic Product (PROBIOTIC PO), Probiotic 10 billion cell capsule  daily, Disp: , Rfl:  .  silver sulfADIAZINE (SILVADENE) 1 % cream, silver sulfadiazine 1 % topical cream, Disp: , Rfl:  .  Sodium Chloride 3 % AERS, Saline Nasal Mist 3 %  spray nose deeply every hour or two and blow nose, Disp: , Rfl:  .  sucralfate (CARAFATE) 1 g tablet, sucralfate 1 gram tablet, Disp: , Rfl:  .  terbinafine  (LAMISIL AT) 1 % cream, Apply 1 application topically 2 (two) times daily., Disp: 42 g, Rfl: 2 .  topiramate (TOPAMAX) 100 MG tablet, topiramate 100 mg tablet  TAKE 1 TABLET BY MOUTH 2 TIMES DAILY., Disp: , Rfl:  .  valACYclovir (VALTREX) 500 MG tablet, valacyclovir 500 mg tablet  1 daily, Disp: , Rfl:   EXAM:  VITALS per patient if applicable:  GENERAL: alert, oriented, appears well and in no acute distress  HEENT: atraumatic, conjunttiva clear, no obvious abnormalities on inspection of external nose and ears  NECK: normal movements of the head and neck  LUNGS: on inspection no signs of respiratory distress, breathing rate appears normal, no obvious gross SOB, gasping or wheezing  CV: no obvious cyanosis  MS: moves all visible extremities without noticeable abnormality  PSYCH/NEURO: pleasant and cooperative, no obvious depression or anxiety, speech and thought processing grossly intact  ASSESSMENT AND PLAN:  Discussed the following assessment and plan:  Right elbow pain - Plan: DG Elbow Complete Right f/u with Dr. Rudene Christians if not resolving rec f/u  Consider ACE wrap and ice   Rib pain on right side - Plan: DG Ribs Unilateral Right -consider otc lidocaine patch   Tinea pedis, unspecified laterality - Plan: Ambulatory referral to DermatologyDr. Kellie Moor Skin cancer screening   Burning in feet  -if not fungal rec f/u with Dr. Manuella Ghazi neurology NCS/EMG   Asthma persistent and uncontrolled  -given sample of breo if she does not like disc symnicort low dose as well  Cont prn albuterol and singulair    HM  Had flu shothad Had pna 23, prevnar, Tdap  Think about shingrix vaccine disc todayagain on backorder ImmuneMMR and hep B  See other HM 11/03/17  Colonoscopy 2015 h/o polys  DEXA 06/29/17 osteopenia h/o osteoporosis endocrine will see 08/21/19 Dr. Julious Oka and he was to try Reclast again though listed on her allergy list he does not think true allergy   Mammogram  06/15/18 mammo negative  -please call to schedule   Pap8/2/19 Dr. Marcelline Mates neg pap neg HPV  Of note CT 09/2017 with ground glass appearance will need repeat in 11/2019never smoker  I discussed the assessment and treatment plan with the patient. The patient was provided an opportunity to ask questions and all were answered. The patient agreed with the plan and demonstrated an understanding of the instructions.   The patient was advised to call back or seek an in-person evaluation if the symptoms worsen or if the condition fails to improve as anticipated.  Time spent 15 minutes  Delorise Jackson, MD

## 2019-07-27 ENCOUNTER — Other Ambulatory Visit: Payer: Self-pay | Admitting: Internal Medicine

## 2019-07-27 DIAGNOSIS — J4530 Mild persistent asthma, uncomplicated: Secondary | ICD-10-CM

## 2019-07-27 MED ORDER — FLUTICASONE-SALMETEROL 250-50 MCG/DOSE IN AEPB: 1.0000 | INHALATION_SPRAY | Freq: Two times a day (BID) | RESPIRATORY_TRACT | 12 refills | Status: DC

## 2019-07-28 ENCOUNTER — Ambulatory Visit (INDEPENDENT_AMBULATORY_CARE_PROVIDER_SITE_OTHER): Payer: Medicare Other

## 2019-07-28 ENCOUNTER — Other Ambulatory Visit: Payer: Self-pay

## 2019-07-28 DIAGNOSIS — Z23 Encounter for immunization: Secondary | ICD-10-CM | POA: Diagnosis not present

## 2019-08-01 ENCOUNTER — Encounter: Payer: Self-pay | Admitting: Dietician

## 2019-08-01 ENCOUNTER — Other Ambulatory Visit: Payer: Self-pay

## 2019-08-01 ENCOUNTER — Encounter: Payer: Medicare Other | Attending: Internal Medicine | Admitting: Dietician

## 2019-08-01 VITALS — Ht 70.0 in | Wt 224.2 lb

## 2019-08-01 DIAGNOSIS — K760 Fatty (change of) liver, not elsewhere classified: Secondary | ICD-10-CM | POA: Diagnosis not present

## 2019-08-01 DIAGNOSIS — E739 Lactose intolerance, unspecified: Secondary | ICD-10-CM | POA: Insufficient documentation

## 2019-08-01 DIAGNOSIS — E669 Obesity, unspecified: Secondary | ICD-10-CM | POA: Insufficient documentation

## 2019-08-01 DIAGNOSIS — E6609 Other obesity due to excess calories: Secondary | ICD-10-CM

## 2019-08-01 DIAGNOSIS — Z713 Dietary counseling and surveillance: Secondary | ICD-10-CM | POA: Insufficient documentation

## 2019-08-01 DIAGNOSIS — Z6832 Body mass index (BMI) 32.0-32.9, adult: Secondary | ICD-10-CM | POA: Diagnosis not present

## 2019-08-01 DIAGNOSIS — R7303 Prediabetes: Secondary | ICD-10-CM | POA: Diagnosis not present

## 2019-08-01 NOTE — Progress Notes (Signed)
Medical Nutrition Therapy Follow-up visit:  Time: 1330-1415 Visit #: 3 ASSESSMENT:  Diagnosis: fatty liver, obesity, pre-diabetes  Current weight: 224.2 lbs    Height:70 in Medications: See list Medical History: lupus, hypothyroidism, migraines,  Progress and evaluation: Patient in for medical nutrition therapy follow-up. She reports that she followed through to mix a lower sugar cereal (cheerios) with frosted flakes. She continues to frequently eat a peanut butter/jelly sandwich for lunch and her mother prepares her dinner meal. She continues to eat meat cut in very small pieces and includes 2 oz portions using food models to show me the amount. Water is her main beverage; does not drink sugar sweetened foods. She is trying to add more fruit in her diet. She states she is lactose intolerant and some of her calcium needs are being met through a supplement. She does tolerate cheese and ice cream;does not like yogurt. She was recently evaluated by GI MD for dysphagia. Her weight today is 2.7 lbs less than 3 weeks ago and she states she is pleased with her weight loss progress.   Physical activity: walks her dog 3 x/day and has increased the time from 20 minutes to 30 minutes.   NUTRITION CARE EDUCATION:  Weight Management/Prediabetes: Commended on continued positive diet changes and especially on her consistent exercise, stressing again importance of safety when walking her dog. Reviewed food groups needed to meet basic nutrient needs. Gave and reviewed "Protein Finder" to show more comprehensive list of foods and protein content. Gave and reviewed "Soft, bite size foods" handout to offer suggestions for easier to swallow foods and ways to prepare.  INTERVENTION:  Food group recommendations: 6 oz of protein food: Count 1 egg as 1 oz, meat the size of deck of cards- 3 oz (cut in small pieces), 1/2 cup beans=1 oz, 2 Tbsp peanut butter=1 oz, 1/4 cup of nuts = 1oz,  1 oz cheese= 1 oz  protein. Refer to protein finder handout to become more familiar with alternative protein sources. Try Fairlife milk to increase protein as well as calcium.  5 servings of fruits/vegetables: Count small cups of fruit such as peaches, applesauce, cooked apples as 1 serving. Count 1/2 cup of cooked vegetables as 1 serving.  3 servings of whole grain daily- 1/2 cup oatmeal = 1 whole grain serving, 1 cup of cheerios= 1 whole grain, 1 slice of 622% whole grain bread = 1 whole grain serving and 1/2 cup of brown rice = 1 whole grain  Refer to recommendations for soft, bite-size foods to help with swallowing. Be sure meats are moist. Include cooked or canned fruits and cooked vegetables verses raw. EDUCATION MATERIALS GIVEN:  . Protein Finder . "Soft, bite size foods" . Goals/ instructions LEARNER/ who was taught:  . Patient   LEVEL OF UNDERSTANDING: . Verbalizes/ demonstrates competency Demonstrated degree of understanding via teach back.  LEARNING BARRIERS: . None  WILLINGNESS TO LEARN/READINESS FOR CHANGE:  Change in progress MONITORING AND EVALUATION:   No follow-up scheduled. Patient was encouraged to call if desires further help with her diet/nutrition.

## 2019-08-01 NOTE — Patient Instructions (Signed)
Food group recommendations: 6 oz of protein food: Count 1 egg as 1 oz, meat the size of deck of cards- 3 oz (cut in small pieces), 1/2 cup beans=1 oz, 2 Tbsp peanut butter=1 oz, 1/4 cup of nuts = 1oz,  1 oz cheese= 1 oz protein. Refer to protein finder handout to become more familiar with alternative protein sources. Try Fairlife milk to increase protein as well as calcium.  5 servings of fruits/vegetables: Count small cups of fruit such as peaches, applesauce, cooked apples as 1 serving. Count 1/2 cup of cooked vegetables as 1 serving.  3 servings of whole grain daily- 1/2 cup oatmeal = 1 whole grain serving, 1 cup of cheerios= 1 whole grain, 1 slice of [id number] whole grain bread = 1 whole grain serving and 1/2 cup of brown rice = 1 whole grain  Refer to recommendations for soft, bite-size foods to help with swallowing. Be sure meats are moist. Include cooked or canned fruits and cooked vegetables verses raw.

## 2019-08-04 ENCOUNTER — Ambulatory Visit: Payer: Medicare Other | Admitting: Internal Medicine

## 2019-08-15 ENCOUNTER — Other Ambulatory Visit: Payer: Self-pay

## 2019-08-17 ENCOUNTER — Other Ambulatory Visit: Payer: Self-pay

## 2019-08-17 ENCOUNTER — Encounter: Payer: Self-pay | Admitting: Internal Medicine

## 2019-08-17 ENCOUNTER — Ambulatory Visit (INDEPENDENT_AMBULATORY_CARE_PROVIDER_SITE_OTHER): Payer: Medicare Other | Admitting: Internal Medicine

## 2019-08-17 VITALS — BP 114/61 | HR 97 | Temp 98.2°F | Ht 70.0 in | Wt 223.0 lb

## 2019-08-17 DIAGNOSIS — K5900 Constipation, unspecified: Secondary | ICD-10-CM | POA: Diagnosis not present

## 2019-08-17 DIAGNOSIS — R9389 Abnormal findings on diagnostic imaging of other specified body structures: Secondary | ICD-10-CM

## 2019-08-17 DIAGNOSIS — G8929 Other chronic pain: Secondary | ICD-10-CM

## 2019-08-17 DIAGNOSIS — M797 Fibromyalgia: Secondary | ICD-10-CM | POA: Diagnosis not present

## 2019-08-17 DIAGNOSIS — R0602 Shortness of breath: Secondary | ICD-10-CM

## 2019-08-17 DIAGNOSIS — R1012 Left upper quadrant pain: Secondary | ICD-10-CM

## 2019-08-17 DIAGNOSIS — M359 Systemic involvement of connective tissue, unspecified: Secondary | ICD-10-CM

## 2019-08-17 DIAGNOSIS — J4530 Mild persistent asthma, uncomplicated: Secondary | ICD-10-CM | POA: Diagnosis not present

## 2019-08-17 DIAGNOSIS — I872 Venous insufficiency (chronic) (peripheral): Secondary | ICD-10-CM

## 2019-08-17 DIAGNOSIS — G20 Parkinson's disease: Secondary | ICD-10-CM

## 2019-08-17 DIAGNOSIS — I890 Lymphedema, not elsewhere classified: Secondary | ICD-10-CM

## 2019-08-17 DIAGNOSIS — M329 Systemic lupus erythematosus, unspecified: Secondary | ICD-10-CM

## 2019-08-17 MED ORDER — FLUTICASONE-SALMETEROL 250-50 MCG/DOSE IN AEPB: 1.0000 | INHALATION_SPRAY | Freq: Two times a day (BID) | RESPIRATORY_TRACT | 12 refills | Status: DC

## 2019-08-17 NOTE — Patient Instructions (Addendum)
Consider miralax 3x per week   Costochondritis Costochondritis is swelling and irritation (inflammation) of the tissue (cartilage) that connects your ribs to your breastbone (sternum). This causes pain in the front of your chest. Usually, the pain:  Starts gradually.  Is in more than one rib. This condition usually goes away on its own over time. Follow these instructions at home:  Do not do anything that makes your pain worse.  If directed, put ice on the painful area: ? Put ice in a plastic bag. ? Place a towel between your skin and the bag. ? Leave the ice on for 20 minutes, 2-3 times a day.  If directed, put heat on the affected area as often as told by your doctor. Use the heat source that your doctor tells you to use, such as a moist heat pack or a heating pad. ? Place a towel between your skin and the heat source. ? Leave the heat on for 20-30 minutes. ? Take off the heat if your skin turns bright red. This is very important if you cannot feel pain, heat, or cold. You may have a greater risk of getting burned.  Take over-the-counter and prescription medicines only as told by your doctor.  Return to your normal activities as told by your doctor. Ask your doctor what activities are safe for you.  Keep all follow-up visits as told by your doctor. This is important. Contact a doctor if:  You have chills or a fever.  Your pain does not go away or it gets worse.  You have a cough that does not go away. Get help right away if:  You are short of breath. This information is not intended to replace advice given to you by your health care provider. Make sure you discuss any questions you have with your health care provider. Document Released: 05/04/2008 Document Revised: 12/01/2017 Document Reviewed: 03/11/2016 Elsevier Patient Education  2020 [employer].  Constipation, Adult Constipation is when a person has fewer bowel movements in a week than normal, has difficulty having  a bowel movement, or has stools that are dry, hard, or larger than normal. Constipation may be caused by an underlying condition. It may become worse with age if a person takes certain medicines and does not take in enough fluids. Follow these instructions at home: Eating and drinking   Eat foods that have a lot of fiber, such as fresh fruits and vegetables, whole grains, and beans.  Limit foods that are high in fat, low in fiber, or overly processed, such as french fries, hamburgers, cookies, candies, and soda.  Drink enough fluid to keep your urine clear or pale yellow. General instructions  Exercise regularly or as told by your health care provider.  Go to the restroom when you have the urge to go. Do not hold it in.  Take over-the-counter and prescription medicines only as told by your health care provider. These include any fiber supplements.  Practice pelvic floor retraining exercises, such as deep breathing while relaxing the lower abdomen and pelvic floor relaxation during bowel movements.  Watch your condition for any changes.  Keep all follow-up visits as told by your health care provider. This is important. Contact a health care provider if:  You have pain that gets worse.  You have a fever.  You do not have a bowel movement after 4 days.  You vomit.  You are not hungry.  You lose weight.  You are bleeding from the anus.  You  have thin, pencil-like stools. Get help right away if:  You have a fever and your symptoms suddenly get worse.  You leak stool or have blood in your stool.  Your abdomen is bloated.  You have severe pain in your abdomen.  You feel dizzy or you faint. This information is not intended to replace advice given to you by your health care provider. Make sure you discuss any questions you have with your health care provider. Document Released: 08/14/2004 Document Revised: 10/29/2017 Document Reviewed: 05/06/2016 Elsevier Patient Education   2020 [employer].

## 2019-08-17 NOTE — Progress Notes (Signed)
Chief Complaint  Patient presents with   Follow-up   F/u  1. On long term disability due to chronic medical conditions (lupus, chronic pain, mental health d/o, asthma, ortho issues, osteoporosis (f/u endocrine appt upcoming [facility], lymphedema and other)) new dx of [place] by neurology on Sinemet 25/100 0.5 tid and will f/u 08/2019  She needs paperwork filled out for [employer] which was filled out and scanned in today and copies given to patient She has not been able to work since 2005 and has trouble with lifting 10+ lbs, lifting overhead. She is able to sit at a desk with computer, She has trouble getting in bed and in and out of cars and off the toilet set and walks with a cane. She has a stool by the bed and has to use grab to get off the toilet door handles and counters which are close. She does not have bowel or bladder incontinence but has trouble dressing I.e putting compression stocking on which her mother has to help. She baseline walks with cane and has tremor on exam  2.  Asthma given Wixela and does not work for her and wants brand advair 250/50 she is still having sob with asthma and feels [place] [employer] is not effective  3. C/o LUQ ab pain/rib pain also reviewed CT scan 03/29/18 unclear if this is MSK I.e costochondritis vs due to constipation vs other. Advised pt she can try otc lidocaine patch and another doctor gave her voltaren gel to try      Review of Systems  Constitutional: Negative for weight loss.  HENT: Negative for hearing loss.   Eyes: Negative for blurred vision.  Respiratory: Positive for shortness of breath.   Cardiovascular: Negative for chest pain.  Musculoskeletal: Positive for joint pain and myalgias. Negative for falls.  Skin: Negative for rash.  Neurological: Positive for tremors.   Past Medical History:  Diagnosis Date   ADHD    Anemia    ANXIETY 06/06/2007   Arthritis    related to lupus   Asthma    BACK PAIN 02/22/2009   Cervical  cancer ([place])    LEEP 2005    Chicken pox    Chronic pain    goes to pain management provider   Collagen vascular disease ([place])    Connective tissue disease ([place])    DEPRESSION 06/06/2007   Essential tremor    Fibromyalgia    Fibromyalgia    FREQUENCY, URINARY 07/07/2007   Gastritis    GERD 06/06/2007   Glaucoma    Headache    h/o migraines    History of hiatal hernia    History of kidney stones    HYPOTHYROIDISM 06/06/2007   INTERSTITIAL CYSTITIS 09/11/2010   Lupus ([place]) 2006   Lupus ([place])    Menopause    Migraine    Mitral valve regurgitation    NEPHROLITHIASIS, HX OF 11/05/2010   Neuropathy    Neuropathy    OCD (obsessive compulsive disorder)    Optic neuritis    2005   OSTEOPENIA 10/14/2009   Osteoporosis    OVERACTIVE BLADDER 10/14/2009   PAIN, CHRONIC NEC 06/06/2007   Panniculitis    Parkinson disease ([place]) 04/03/2019   POLYARTHRITIS 08/19/2007   Raynaud's disease    Sicca syndrome ([place])    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 Marcelline Mates [facility]    Vasculitis [facility])    WEIGHT GAIN 02/22/2009   Past Surgical History:  Procedure Laterality Date  APPENDECTOMY     CERVICAL BIOPSY  W/ LOOP ELECTRODE EXCISION  2005   CHOLECYSTECTOMY     ESOPHAGOGASTRODUODENOSCOPY (EGD) WITH PROPOFOL N/A 08/27/2017   Procedure: ESOPHAGOGASTRODUODENOSCOPY (EGD) WITH PROPOFOL;  Surgeon: Manya Silvas, MD;  Location: [facility] ENDOSCOPY;  Service: Endoscopy;  Laterality: N/A;   HAND SURGERY     repair of left metatarsal    HARDWARE REMOVAL Right 02/16/2017   Procedure: HARDWARE REMOVAL FROM HIP;  Surgeon: Hessie Knows, MD;  Location: [facility] ORS;  Service: Orthopedics;  Laterality: Right;   JOINT REPLACEMENT     OTHER SURGICAL HISTORY     left 3rd metatarsal fracture repair 2011    REVISION TOTAL HIP ARTHROPLASTY  06/2009   replacement 2010 then screw and plate removal 2248; right hip   TONSILLECTOMY     TONSILLECTOMY       wrist  ligament repair bilateral     WRIST RECONSTRUCTION     WRIST SURGERY     laceration of right wrist ligaments   wrist surgery     laceration of left wrist surgery    Family History  Problem Relation Age of Onset   Hypertension Mother    Arthritis Mother    Heart disease Mother        afib   Asthma Sister    Asthma Daughter    Arthritis Daughter    Heart disease Daughter        ?heart condition on BB   ADD / ADHD Daughter    Pancreatic cancer Father    Cancer Father        pancreatitic    Diabetes Maternal Grandmother    Heart disease Maternal Grandmother    Arthritis Maternal Grandmother    Hypertension Maternal Grandmother    Colon cancer Maternal Uncle 39   Crohn's disease Maternal Aunt    Diabetes Maternal Aunt    Breast cancer Cousin 74       maternal   Cancer Cousin        m cousin breat cancer s/p removal both breasts    Social History   Socioeconomic History   Marital status: Single    Spouse name: Not on file   Number of children: 1   Years of education: Not on file   Highest education level: Not on file  Occupational History   Occupation: Tour manager: UNEMPLOYED  Social Designer, fashion/clothing strain: Not on file   Food insecurity    Worry: Not on file    Inability: Not on file   Transportation needs    Medical: Not on file    Non-medical: Not on file  Tobacco Use   Smoking status: Never Smoker   Smokeless tobacco: Never Used  Substance and Sexual Activity   Alcohol use: No   Drug use: No   Sexual activity: Not Currently    Birth control/protection: None, Post-menopausal  Lifestyle   Physical activity    Days per week: Not on file    Minutes per session: Not on file   Stress: Not on file  Relationships   Social connections    Talks on phone: Not on file    Gets together: Not on file    Attends religious service: Not on file    Active member of club or organization: Not on file     Attends meetings of clubs or organizations: Not on file    Relationship status: Not on file   Intimate partner violence  Fear of current or ex partner: Not on file    Emotionally abused: Not on file    Physically abused: Not on file    Forced sexual activity: Not on file  Other Topics Concern   Not on file  Social History Narrative   She was previously a pediatrician, 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      Current Meds  Medication Sig   albuterol (VENTOLIN HFA) 108 (90 Base) MCG/ACT inhaler Inhale 1-2 puffs into the lungs every 4 (four) hours as needed for wheezing or shortness of breath.   ARIPiprazole (ABILIFY) 20 MG tablet Take 20 mg by mouth daily.   Ascorbic Acid (VITAMIN C) 500 MG CHEW Chew 2,500 mg by mouth daily.   aspirin 325 MG tablet Take 1 tablet (325 mg total) by mouth daily.   Biotin 10 MG CAPS Take by mouth.   Biotin 5000 MCG SUBL Place 10,000 mcg under the tongue daily.   calcipotriene (DOVONOX) 0.005 % ointment calcipotriene 0.005 % topical ointment  APPLY A THIN LAYER TO THE AFFECTED AREA(S) BY TOPICAL ROUTE ONCE DAILY ; RUB IN GENTLY AND COMPLETELY   carbidopa-levodopa (SINEMET IR) 10-100 MG tablet Take 0.5 tablets by mouth 3 (three) times daily.   Cholecalciferol (VITAMIN D3) 2000 units capsule Vitamin D3 2,000 unit capsule   dapsone 25 MG tablet Take by mouth daily.    dexmethylphenidate (FOCALIN XR) 20 MG 24 hr capsule dexmethylphenidate ER 20 mg capsule,extended release biphasic50-50   diclofenac sodium (VOLTAREN) 1 % GEL Apply 2 g topically 4 (four) times daily as needed (for knee & finger pain.).    dicyclomine (BENTYL) 10 MG capsule dicyclomine 10 mg capsule  every 8hours as needed   doxycycline (VIBRA-TABS) 100 MG tablet Take 1 tablet (100 mg total) by mouth 2 (two) times daily. With food   DULoxetine (CYMBALTA) 60 MG capsule Take 60 mg by mouth 2 (two) times daily.    Elastic Bandages & Supports (MEDICAL COMPRESSION STOCKINGS) MISC Please provide 58mHg compression stockings   fentaNYL (DURAGESIC - DOSED MCG/HR) 75 MCG/HR Place 75 mcg onto the skin every 3 (three) days.   Ferrous Sulfate (IRON PO) Take by mouth.   fluconazole (DIFLUCAN) 150 MG tablet Take 1 tablet (150 mg total) by mouth once a week. Repeat another dose in another week x 1 pill (over 2 weeks)   fluocinonide ointment (LIDEX) 0.05 % fluocinonide 0.05 % topical ointment  APPLY TO THE AFFECTED AREA(S) BY TOPICAL ROUTE 2 TIMES PER DAY   folic acid (FOLVITE) 1 MG tablet Take 1 tablet (1 mg total) by mouth daily.   furosemide (LASIX) 20 MG tablet Take 20 mg by mouth daily as needed.    gabapentin (NEURONTIN) 100 MG capsule Take by mouth.   hydrocortisone (PROCTOZONE-HC) 2.5 % rectal cream Proctozone-HC 2.5 % topical cream perineal applicator   hydroxychloroquine (PLAQUENIL) 200 MG tablet Plaquenil 200 mg tablet  Take 1 tablet twice a day by oral route after meals for 90 days.   ibuprofen (ADVIL,MOTRIN) 800 MG tablet Take 1 tablet (800 mg total) by mouth every 8 (eight) hours as needed.   imiquimod (ALDARA) 5 % cream imiquimod 5 % topical cream packet  APPLY TO THE AFFECTED AREA(S) BY TOPICAL ROUTE 3 TIMES PER WEEK at bedtime   isosorbide mononitrate (IMDUR) 60 MG 24 hr tablet Take 1 tablet (60 mg total) by mouth daily.   levothyroxine (SYNTHROID, LEVOTHROID)  25 MCG tablet Take 1 tablet (25 mcg total) by mouth daily before breakfast.   LORazepam (ATIVAN) 2 MG tablet Take 1-2 mg by mouth See admin instructions. 1 MG DAILY AS NEEDED FOR ANXIETY & SCHEDULED AT BEDTIME EACH NIGHT   meclizine (ANTIVERT) 25 MG tablet Take 1 tablet (25 mg total) by mouth 3 (three) times daily as needed for dizziness or nausea.   methylphenidate 18 MG PO CR tablet Take by mouth.    montelukast (SINGULAIR) 10 MG tablet montelukast 10 mg tablet 1 pill qhs   Multiple Vitamins-Minerals (MULTI-VITAMIN GUMMIES  PO) Multi Vitamin  GUMMIES   omeprazole (PRILOSEC) 40 MG capsule Take 40 mg by mouth 2 (two) times daily.   ondansetron (ZOFRAN) 4 MG tablet Zofran 8 mg tablet  Take 1 tablet every 8 hours by oral route as needed.   pantoprazole (PROTONIX) 40 MG tablet Take one tablet by mouth twice daily (Patient taking differently: TAKE 1 TABLET (40 MG) BY MOUTH ONCE DAILY IN THE MORNING.)   PARoxetine (PAXIL) 10 MG tablet Take 1 tablet (10 mg total) by mouth at bedtime.   polyethylene glycol powder (GLYCOLAX/MIRALAX) powder Take 17 g by mouth daily.   Polyvinyl Alcohol-Povidone (REFRESH OP) Place 1 drop into both eyes 2 (two) times daily.   predniSONE (DELTASONE) 10 MG tablet prednisone 10 mg tablet  1 daily   Probiotic Product (PROBIOTIC PO) Probiotic 10 billion cell capsule  daily   silver sulfADIAZINE (SILVADENE) 1 % cream silver sulfadiazine 1 % topical cream   Sodium Chloride 3 % AERS Saline Nasal Mist 3 %  spray nose deeply every hour or two and blow nose   sucralfate (CARAFATE) 1 g tablet sucralfate 1 gram tablet   terbinafine (LAMISIL AT) 1 % cream Apply 1 application topically 2 (two) times daily.   topiramate (TOPAMAX) 100 MG tablet 100-150 mg. 100 in am and 150 qhs   valACYclovir (VALTREX) 500 MG tablet valacyclovir 500 mg tablet  1 daily   [DISCONTINUED] budesonide-formoterol (SYMBICORT) 160-4.5 MCG/ACT inhaler Symbicort 160 mcg-4.5 mcg/actuation HFA aerosol inhaler   [DISCONTINUED] Fluticasone-Salmeterol (ADVAIR DISKUS) 250-50 MCG/DOSE AEPB Inhale 1 puff into the lungs 2 (two) times daily. Rinse mouth   Allergies  Allergen Reactions   Bee Venom Itching and Swelling    Affected area Affected area Affected area Affected area    Fish Allergy Hives, Swelling and Rash    Facial swelling  Facial swelling  Facial swelling Facial swelling  Facial swelling    Latex Anaphylaxis, Rash and Shortness Of Breath    Rash  Rash     Prasterone Other (See Comments) and  Nausea And Vomiting    rash Other reaction(s): Headache Headaches.   Shellfish Allergy Hives, Other (See Comments), Rash and Swelling    Facial swelling Uncoded Allergy. Allergen: seafood Uncoded Allergy. Allergen: CATS, Other Reaction: itch, wheezing Uncoded Allergy. Allergen: COMPAZINE, Other Reaction: tremors Facial swelling Facial swelling Uncoded Allergy. Allergen: seafood Uncoded Allergy. Allergen: CATS, Other Reaction: itch, wheezing Uncoded Allergy. Allergen: COMPAZINE, Other Reaction: tremors Facial swelling Uncoded Allergy. Allergen: seafood Uncoded Allergy. Allergen: CATS, Other Reaction: itch, wheezing Uncoded Allergy. Allergen: COMPAZINE, Other Reaction: tremors Facial swelling Facial swelling Uncoded Allergy. Allergen: seafood Uncoded Allergy. Allergen: CATS, Other Reaction: itch, wheezing Uncoded Allergy. Allergen: COMPAZINE, Other Reaction: tremors Facial swelling Facial swelling Uncoded Allergy. Allergen: seafood Uncoded Allergy. Allergen: CATS, Other Reaction: itch, wheezing Uncoded Allergy. Allergen: COMPAZINE, Other Reaction: tremors    Shellfish-Derived Products Hives, Other (See Comments), Rash and Swelling  Facial swelling Uncoded Allergy. Allergen: seafood Uncoded Allergy. Allergen: CATS, Other Reaction: itch, wheezing Uncoded Allergy. Allergen: COMPAZINE, Other Reaction: tremors Facial swelling Facial swelling Uncoded Allergy. Allergen: seafood Uncoded Allergy. Allergen: CATS, Other Reaction: itch, wheezing Uncoded Allergy. Allergen: COMPAZINE, Other Reaction: tremors Facial swelling Uncoded Allergy. Allergen: seafood Uncoded Allergy. Allergen: CATS, Other Reaction: itch, wheezing Uncoded Allergy. Allergen: COMPAZINE, Other Reaction: tremors Facial swelling   Compazine [Prochlorperazine Edisylate] Other (See Comments)    tremors   Dhea [Nutritional Supplements] Other (See Comments)    Headaches.   Dilaudid [Hydromorphone Hcl]     ?  reaction   Lactose    Lactose Intolerance (Gi)    Other Other (See Comments)    Other reaction(s): Unknown    Prochlorperazine     Other reaction(s): Other (See Comments) ticks   Promethazine     Other reaction(s): Other (See Comments) Ticks   Promethazine Hcl Other (See Comments)    CNS disorder   Reclast [Zoledronic Acid]     Weakness could not move limbs, fatigue, increase sleep   Clarithromycin Hives and Rash   Clotrimazole Swelling and Rash   Hydromorphone Hcl Rash and Hives    "Rash all over"   Penicillins Rash and Other (See Comments)    Has patient had a PCN reaction causing immediate rash, facial/tongue/throat swelling, SOB or lightheadedness with hypotension:No Has patient had a PCN reaction causing severe rash involving mucus membranes or skin necrosis:No Has patient had a PCN reaction that required hospitalization:No Has patient had a PCN reaction occurring within the last 10 years:No If all of the above answers are "NO", then may proceed with Cephalosporin use.   No results found for this or any previous visit (from the past 2160 hour(s)). Objective  Body mass index is 32 kg/m. Wt Readings from Last 3 Encounters:  08/17/19 223 lb (101.2 kg)  08/01/19 224 lb 3.2 oz (101.7 kg)  07/20/19 223 lb (101.2 kg)   Temp Readings from Last 3 Encounters:  08/17/19 98.2 F (36.8 C) (Oral)  04/08/19 98.3 F (36.8 C) (Axillary)  04/07/19 98.2 F (36.8 C) (Oral)   BP Readings from Last 3 Encounters:  08/17/19 114/61  07/04/19 127/67  06/26/19 118/68   Pulse Readings from Last 3 Encounters:  08/17/19 97  07/04/19 90  06/26/19 87    Physical Exam Vitals signs and nursing note reviewed.  Constitutional:      Appearance: Normal appearance. She is well-developed.  HENT:     Head: Normocephalic and atraumatic.     Comments: +mask on   Cardiovascular:     Rate and Rhythm: Normal rate and regular rhythm.     Heart sounds: Normal heart sounds.      Comments: Chronic lymphedema with compression stockings on   Pulmonary:     Effort: Pulmonary effort is normal.     Breath sounds: Normal breath sounds. No wheezing.  Chest:    Musculoskeletal:     Right lower leg: Edema present.     Left lower leg: Edema present.  Skin:    General: Skin is warm and dry.  Neurological:     General: No focal deficit present.     Mental Status: She is alert and oriented to person, place, and time. Mental status is at baseline.     Motor: Tremor present.     Gait: Gait abnormal.     Comments: +tremor on exam  BL walks with cane   Psychiatric:  Attention and Perception: Attention and perception normal.        Mood and Affect: Mood and affect normal.        Speech: Speech normal.        Behavior: Behavior normal. Behavior is cooperative.        Thought Content: Thought content normal.        Cognition and Memory: Cognition and memory normal.        Judgment: Judgment normal.     Assessment  Plan  Mild persistent asthma, unspecified whether complicated - Plan: Fluticasone-Salmeterol (ADVAIR DISKUS) 250-50 MCG/DOSE brand only Wixela ineffective   Multiple chronic illness on disability  PAIN, CHRONIC NEC Other chronic pain Fibromyalgia Lupus arthritis vs CTD undifferentiated-f/u [facility] rheum cont meds working per pt I.e plaquineil and dapsone -ask about eye exam in future  -filled out paperwork for [place] which took 25 + minutes or more   Constipation, unspecified constipation type -with being on chronic pain meds as sinemet also can cause constipation  -rec she take miralax 3x per week   Chronic venous insufficiency Lymphedema -has not started using lymph pump just received but has on compression stockings today  F/u [facility]  Parkinson disease (H[facility])-f/u neurology 08/2019 cont meds   Abdominal pain, acute, left upper quadrant ? MSK vs GI I.e constipation  -rec use lidocaine patch, voltaren gel and Miralax 3x per week     Abnormal CXR 04/07/2019  IMPRESSION: Irregular biapical peripheral opacities, potentially reflecting pleuroparenchymal scarring, but new since 10/11/2017. apical infection cannot be excluded. CT imaging could be used to further evaluate as clinically warranted.  -will order CT chest as suggested to f/u  08/14/2019 Creatinine 0.9 and GFR 72  Of note CT chest  01/03/16  IMPRESSION: 1. No pulmonary embolus. 2. Dependent opacities at the lung bases suggestive of hypoventilatory change.  10/11/17  Lungs/Pleura: Dependent ground-glass attenuation is symmetric and similar appearance to prior study likely related to atelectasis. No evidence for pulmonary edema. No pleural effusion.  Upper Abdomen: Unremarkable.  Musculoskeletal: Bone windows reveal no worrisome lytic or sclerotic osseous lesions.   IMPRESSION: 1. No CT evidence for acute pulmonary embolus. 2. Symmetric dependent ground-glass attenuation in the lungs, similar to prior study. Imaging features likely related to hypo expansion/ compressive atelectasis.  HM Had flu [id number]  Had pna 23 in 2013, prevnar 2015, Tdap 10/19/16  Think about shingrix vaccine in future prev discussed  ImmuneMMR and hep B  See other HM 11/03/17  Colonoscopy 2015 h/o polys  DEXA 06/29/17 osteopeniah/o osteoporosis -f/u endocrine [facility] Dr. Julious Oka and he was to try Reclast again though listed on her allergy list he does not think true allergy  Mammogram 06/15/18 mammo negative - scheduled 08/23/19   Pap8/2/19 Dr. Marcelline Mates neg pap neg HPV  See CT chest above notes    Time spent for chronic medical conditions and filling out paperwork and further w/u combined 40+ minutes  Provider: Dr. Olivia Mackie McLean-Scocuzza-Internal Medicine

## 2019-08-23 ENCOUNTER — Ambulatory Visit
Admission: RE | Admit: 2019-08-23 | Discharge: 2019-08-23 | Disposition: A | Discharge status: Home / Self Care | Payer: Medicare Other | Source: Ambulatory Visit | Attending: Obstetrics and Gynecology | Admitting: Obstetrics and Gynecology

## 2019-08-23 DIAGNOSIS — Z1231 Encounter for screening mammogram for malignant neoplasm of breast: Secondary | ICD-10-CM | POA: Insufficient documentation

## 2019-08-23 NOTE — Progress Notes (Signed)
Patient is ok with CT. Also she is wondering about her paperwork. Please advise.

## 2019-08-23 NOTE — Progress Notes (Signed)
Patient was informed. She didn't realized we gave it back.

## 2019-08-23 NOTE — Progress Notes (Signed)
Paperwork was done on day of visit what is she talking about  Should have been given to her   [place]

## 2019-09-04 ENCOUNTER — Ambulatory Visit
Admission: RE | Admit: 2019-09-04 | Discharge: 2019-09-04 | Disposition: A | Discharge status: Home / Self Care | Payer: Medicare Other | Source: Ambulatory Visit | Attending: Internal Medicine | Admitting: Internal Medicine

## 2019-09-04 ENCOUNTER — Other Ambulatory Visit: Payer: Self-pay

## 2019-09-04 DIAGNOSIS — R0602 Shortness of breath: Secondary | ICD-10-CM

## 2019-09-04 DIAGNOSIS — R9389 Abnormal findings on diagnostic imaging of other specified body structures: Secondary | ICD-10-CM | POA: Diagnosis present

## 2019-09-04 DIAGNOSIS — J4530 Mild persistent asthma, uncomplicated: Secondary | ICD-10-CM

## 2019-09-04 MED ORDER — IOHEXOL 300 MG/ML  SOLN
75.0000 mL | Freq: Once | INTRAMUSCULAR | Status: AC | PRN
  Administered 2019-09-04: 15:00:00 75 mL via INTRAVENOUS

## 2019-09-27 ENCOUNTER — Other Ambulatory Visit: Payer: Self-pay

## 2019-09-27 ENCOUNTER — Ambulatory Visit (INDEPENDENT_AMBULATORY_CARE_PROVIDER_SITE_OTHER): Payer: Medicare Other

## 2019-09-27 DIAGNOSIS — Z0000 Encounter for general adult medical examination without abnormal findings: Secondary | ICD-10-CM

## 2019-09-27 NOTE — Patient Instructions (Addendum)
  [name] , Thank you for taking time to come for your Medicare Wellness Visit. I appreciate your ongoing commitment to your health goals. Please review the following plan we discussed and let me know if I can assist you in the future.   These are the goals we discussed: Goals    . Follow up with Primary Care Provider     As needed       This is a list of the screening recommended for you and due dates:  Health Maintenance  Topic Date Due  . Eye exam for diabetics  08/22/2014  . Pap Smear  07/01/2021  . Mammogram  08/22/2021  . Colon Cancer Screening  12/17/2024  . Tetanus Vaccine  10/19/2026  . Flu Shot  Completed  . Pneumococcal vaccine  Completed  .  Hepatitis C: One time screening is recommended by Center for Disease Control  (CDC) for  adults born from [birth year] through 1967.   Completed  . HIV Screening  Completed  . Complete foot exam   Discontinued  . Hemoglobin A1C  Discontinued  . Urine Protein Check  Discontinued

## 2019-09-27 NOTE — Progress Notes (Addendum)
Subjective:   [name] [name] is a 53 y.o. female who presents for an Initial Medicare Annual Wellness Visit.  Review of Systems    No ROS.  Medicare Wellness Virtual Visit.  Visual/audio telehealth visit, UTA vital signs.   See social history for additional risk factors.    Cardiac Risk Factors include: advanced age (>27men, >81 women)     Objective:    Today's Vitals   There is no height or weight on file to calculate BMI.  Advanced Directives 09/27/2019 05/08/2019 04/08/2019 04/07/2019 12/25/2018 07/18/2018 07/04/2018  Does Patient Have a Medical Advance Directive? No No No No No No No  Would patient like information on creating a medical advance directive? No - Patient declined No - Patient declined No - Patient declined No - Patient declined - No - Patient declined No - Patient declined    Current Medications (verified) Outpatient Encounter Medications as of 09/27/2019  Medication Sig  . albuterol (VENTOLIN HFA) 108 (90 Base) MCG/ACT inhaler Inhale 1-2 puffs into the lungs every 4 (four) hours as needed for wheezing or shortness of breath.  . ARIPiprazole (ABILIFY) 20 MG tablet Take 20 mg by mouth daily.  . Ascorbic Acid (VITAMIN C) 500 MG CHEW Chew 2,500 mg by mouth daily.  Marland Kitchen aspirin 325 MG tablet Take 1 tablet (325 mg total) by mouth daily.  . Biotin 10 MG CAPS Take by mouth.  . Biotin 5000 MCG SUBL Place 10,000 mcg under the tongue daily.  . calcipotriene (DOVONOX) 0.005 % ointment calcipotriene 0.005 % topical ointment  APPLY A THIN LAYER TO THE AFFECTED AREA(S) BY TOPICAL ROUTE ONCE DAILY ; RUB IN GENTLY AND COMPLETELY  . carbidopa-levodopa (SINEMET IR) 10-100 MG tablet Take 0.5 tablets by mouth 3 (three) times daily.  . Cholecalciferol (VITAMIN D3) 2000 units capsule Vitamin D3 2,000 unit capsule  . dapsone 25 MG tablet Take by mouth daily.   Marland Kitchen dexmethylphenidate (FOCALIN XR) 20 MG 24 hr capsule dexmethylphenidate ER 20 mg capsule,extended release biphasic50-50  .  diclofenac sodium (VOLTAREN) 1 % GEL Apply 2 g topically 4 (four) times daily as needed (for knee & finger pain.).   Marland Kitchen dicyclomine (BENTYL) 10 MG capsule dicyclomine 10 mg capsule  every 8hours as needed  . doxycycline (VIBRA-TABS) 100 MG tablet Take 1 tablet (100 mg total) by mouth 2 (two) times daily. With food  . DULoxetine (CYMBALTA) 60 MG capsule Take 60 mg by mouth 2 (two) times daily.  Regino Schultze Bandages & Supports (MEDICAL COMPRESSION STOCKINGS) MISC Please provide 88mmHg compression stockings  . fentaNYL (DURAGESIC - DOSED MCG/HR) 75 MCG/HR Place 75 mcg onto the skin every 3 (three) days.  . Ferrous Sulfate (IRON PO) Take by mouth.  . fluconazole (DIFLUCAN) 150 MG tablet Take 1 tablet (150 mg total) by mouth once a week. Repeat another dose in another week x 1 pill (over 2 weeks)  . fluocinonide ointment (LIDEX) 0.05 % fluocinonide 0.05 % topical ointment  APPLY TO THE AFFECTED AREA(S) BY TOPICAL ROUTE 2 TIMES PER DAY  . Fluticasone-Salmeterol (ADVAIR DISKUS) 250-50 MCG/DOSE AEPB Inhale 1 puff into the lungs 2 (two) times daily. Rinse mouth  . folic acid (FOLVITE) 1 MG tablet Take 1 tablet (1 mg total) by mouth daily.  . furosemide (LASIX) 20 MG tablet Take 20 mg by mouth daily as needed.   . gabapentin (NEURONTIN) 100 MG capsule Take by mouth.  . hydrocortisone (PROCTOZONE-HC) 2.5 % rectal cream Proctozone-HC 2.5 % topical cream perineal applicator  .  hydroxychloroquine (PLAQUENIL) 200 MG tablet Plaquenil 200 mg tablet  Take 1 tablet twice a day by oral route after meals for 90 days.  Marland Kitchen ibuprofen (ADVIL,MOTRIN) 800 MG tablet Take 1 tablet (800 mg total) by mouth every 8 (eight) hours as needed.  . imiquimod (ALDARA) 5 % cream imiquimod 5 % topical cream packet  APPLY TO THE AFFECTED AREA(S) BY TOPICAL ROUTE 3 TIMES PER WEEK at bedtime  . isosorbide mononitrate (IMDUR) 60 MG 24 hr tablet Take 1 tablet (60 mg total) by mouth daily.  Marland Kitchen levothyroxine (SYNTHROID, LEVOTHROID) 25 MCG tablet  Take 1 tablet (25 mcg total) by mouth daily before breakfast.  . LORazepam (ATIVAN) 2 MG tablet Take 1-2 mg by mouth See admin instructions. 1 MG DAILY AS NEEDED FOR ANXIETY & SCHEDULED AT BEDTIME EACH NIGHT  . meclizine (ANTIVERT) 25 MG tablet Take 1 tablet (25 mg total) by mouth 3 (three) times daily as needed for dizziness or nausea.  . methylphenidate 18 MG PO CR tablet Take by mouth.   . montelukast (SINGULAIR) 10 MG tablet montelukast 10 mg tablet 1 pill qhs  . Multiple Vitamins-Minerals (MULTI-VITAMIN GUMMIES PO) Multi Vitamin  GUMMIES  . omeprazole (PRILOSEC) 40 MG capsule Take 40 mg by mouth 2 (two) times daily.  . ondansetron (ZOFRAN) 4 MG tablet Zofran 8 mg tablet  Take 1 tablet every 8 hours by oral route as needed.  . pantoprazole (PROTONIX) 40 MG tablet Take one tablet by mouth twice daily (Patient taking differently: TAKE 1 TABLET (40 MG) BY MOUTH ONCE DAILY IN THE MORNING.)  . PARoxetine (PAXIL) 10 MG tablet Take 1 tablet (10 mg total) by mouth at bedtime.  . polyethylene glycol powder (GLYCOLAX/MIRALAX) powder Take 17 g by mouth daily.  . Polyvinyl Alcohol-Povidone (REFRESH OP) Place 1 drop into both eyes 2 (two) times daily.  . predniSONE (DELTASONE) 10 MG tablet prednisone 10 mg tablet  1 daily  . Probiotic Product (PROBIOTIC PO) Probiotic 10 billion cell capsule  daily  . silver sulfADIAZINE (SILVADENE) 1 % cream silver sulfadiazine 1 % topical cream  . Sodium Chloride 3 % AERS Saline Nasal Mist 3 %  spray nose deeply every hour or two and blow nose  . sucralfate (CARAFATE) 1 g tablet sucralfate 1 gram tablet  . terbinafine (LAMISIL AT) 1 % cream Apply 1 application topically 2 (two) times daily.  Marland Kitchen topiramate (TOPAMAX) 100 MG tablet 100-150 mg. 100 in am and 150 qhs  . valACYclovir (VALTREX) 500 MG tablet valacyclovir 500 mg tablet  1 daily   No facility-administered encounter medications on file as of 09/27/2019.     Allergies (verified) Bee venom, Fish allergy,  Latex, Prasterone, Shellfish allergy, Shellfish-derived products, Compazine [prochlorperazine edisylate], Dhea [nutritional supplements], Dilaudid [hydromorphone hcl], Lactose, Lactose intolerance (gi), Other, Prochlorperazine, Promethazine, Promethazine hcl, Reclast [zoledronic acid], Clarithromycin, Clotrimazole, Hydromorphone hcl, and Penicillins   History: Past Medical History:  Diagnosis Date  . ADHD   . Anemia   . ANXIETY 06/06/2007  . Arthritis    related to lupus  . Asthma   . BACK PAIN 02/22/2009  . Cervical cancer ([place])    LEEP 2005   . Chicken pox   . Chronic pain    goes to pain management provider  . Collagen vascular disease ([place])   . Connective tissue disease ([place])   . DEPRESSION 06/06/2007  . Essential tremor   . Fibromyalgia   . Fibromyalgia   . FREQUENCY, URINARY 07/07/2007  . Gastritis   .  GERD 06/06/2007  . Glaucoma   . Headache    h/o migraines   . History of hiatal hernia   . History of kidney stones   . HYPOTHYROIDISM 06/06/2007  . INTERSTITIAL CYSTITIS 09/11/2010  . Lupus ([place]) 2006  . Lupus ([place])   . Menopause   . Migraine   . Mitral valve regurgitation   . NEPHROLITHIASIS, HX OF 11/05/2010  . Neuropathy   . Neuropathy   . OCD (obsessive compulsive disorder)   . Optic neuritis    2005  . OSTEOPENIA 10/14/2009  . Osteoporosis   . OVERACTIVE BLADDER 10/14/2009  . PAIN, CHRONIC NEC 06/06/2007  . Panniculitis   . Parkinson disease ([place]) 04/03/2019  . POLYARTHRITIS 08/19/2007  . Raynaud's disease   . 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 Vaginal prolapse    Dr. Marcelline Mates [facility]   . Vasculitis ([place])   . WEIGHT GAIN 02/22/2009   Past Surgical History:  Procedure Laterality Date  . APPENDECTOMY    . CERVICAL BIOPSY  W/ LOOP ELECTRODE EXCISION  2005  . CHOLECYSTECTOMY    . ESOPHAGOGASTRODUODENOSCOPY (EGD) WITH PROPOFOL N/A 08/27/2017   Procedure: ESOPHAGOGASTRODUODENOSCOPY (EGD) WITH PROPOFOL;  Surgeon: Manya Silvas, MD;   Location: [facility] ENDOSCOPY;  Service: Endoscopy;  Laterality: N/A;  . HAND SURGERY     repair of left metatarsal   . HARDWARE REMOVAL Right 02/16/2017   Procedure: HARDWARE REMOVAL FROM HIP;  Surgeon: Hessie Knows, MD;  Location: [facility] ORS;  Service: Orthopedics;  Laterality: Right;  . JOINT REPLACEMENT    . OTHER SURGICAL HISTORY     left 3rd metatarsal fracture repair 2011   . REVISION TOTAL HIP ARTHROPLASTY  06/2009   replacement 2010 then screw and plate removal [id number]; right hip  . TONSILLECTOMY    . TONSILLECTOMY    . wrist  ligament repair bilateral    . WRIST RECONSTRUCTION    . WRIST SURGERY     laceration of right wrist ligaments  . wrist surgery     laceration of left wrist surgery    Family History  Problem Relation Age of Onset  . Hypertension Mother   . Arthritis Mother   . Heart disease Mother        afib  . Asthma Sister   . Asthma Daughter   . Arthritis Daughter   . Heart disease Daughter        ?heart condition on BB  . ADD / ADHD Daughter   . Pancreatic cancer Father   . Cancer Father        pancreatitic   . Diabetes Maternal Grandmother   . Heart disease Maternal Grandmother   . Arthritis Maternal Grandmother   . Hypertension Maternal Grandmother   . Colon cancer Maternal Uncle 8  . Crohn's disease Maternal Aunt   . Diabetes Maternal Aunt   . Breast cancer Cousin 81       maternal  . Cancer Cousin        m cousin breat cancer s/p removal both breasts    Social History   Socioeconomic History  . Marital status: Single    Spouse name: Not on file  . Number of children: 1  . Years of education: Not on file  . Highest education level: Not on file  Occupational History  . Occupation: DISABLED    Employer: UNEMPLOYED  Social Needs  . Financial resource strain: Not hard at all  . Food insecurity  Worry: Never true    Inability: Never true  . Transportation needs    Medical: No    Non-medical: No  Tobacco Use  . Smoking status: Never Smoker   . Smokeless tobacco: Never Used  Substance and Sexual Activity  . Alcohol use: No  . Drug use: No  . Sexual activity: Not Currently    Birth control/protection: None, Post-menopausal  Lifestyle  . Physical activity    Days per week: 7 days    Minutes per session: 20 min  . Stress: Only a little  Relationships  . Social Herbalist on phone: Not on file    Gets together: Not on file    Attends religious service: Not on file    Active member of club or organization: Not on file    Attends meetings of clubs or organizations: Not on file    Relationship status: Not on file  Other Topics Concern  . 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       Tobacco Counseling Counseling given: Not Answered   Clinical Intake:  Pre-visit preparation completed: Yes        Diabetes: No  How often do you need to have someone help you when you read instructions, pamphlets, or other written materials from your doctor or pharmacy?: 1 - Never  Interpreter Needed?: No      Activities of Daily Living In your present state of health, do you have any difficulty performing the following activities: 09/27/2019  Hearing? N  Vision? N  Difficulty concentrating or making decisions? Y  Comment Followed by neurology.  Walking or climbing stairs? Y  Comment Unsteady gait. Cane in use.  Dressing or bathing? Y  Comment Assisted by mother  Doing errands, shopping? Y  Comment She does not drive alone; mother to accompany and assist  Preparing Food and eating ? Y  Comment Assisted by mother  Using the Toilet? N  In the past six months, have you accidently leaked urine? N  Do you have problems with loss of bowel control? Y  Comment Followed by GI, Tammi Klippel  Managing your Medications? N  Managing your Finances? N  Housekeeping or managing your  Housekeeping? Y  Some recent data might be hidden     Immunizations and Health Maintenance Immunization History  Administered Date(s) Administered  . Influenza Inj Mdck Quad Pf 08/30/2017  . Influenza Split 08/13/2011, 08/19/2012, 09/18/2013  . Influenza Whole 09/13/2007, 09/12/2008, 08/06/2010  . Influenza,inj,Quad PF,6+ Mos 08/13/2011, 08/19/2012, 09/18/2013, 08/21/2014, 09/18/2014, 09/02/2015, 09/01/2016, 08/30/2017, 08/19/2018, 07/28/2019  . Influenza-Unspecified 09/02/2015, 08/30/2017  . Pneumococcal Conjugate-13 11/30/2004, 06/04/2014, 09/03/2014  . Pneumococcal Polysaccharide-23 11/30/2004, 09/05/2012  . Td 11/30/2004  . Tdap 09/11/2014, 11/05/2014, 10/19/2016   Health Maintenance Due  Topic Date Due  . OPHTHALMOLOGY EXAM  08/22/2014    Patient Care Team: McLean-Scocuzza, Nino Glow, MD as PCP - General (Internal Medicine)  Indicate any recent Medical Services you may have received from other than [facility] providers in the past year (date may be approximate).     Assessment:   This is a routine wellness examination for Lotta.  Nurse connected with patient 09/27/19 at 12:00 PM EDT by a telephone enabled telemedicine application and verified that I am speaking with the correct person using two identifiers. Patient stated full name and DOB. Patient  gave permission to continue with virtual visit. Patient's location was at home and Nurse's location was at [place] office.   Health Maintenance Due: -Eye- scheduled 10/2019 Update all pending maintenance due as appropriate.   See completed HM at the end of note.   Eye: Visual acuity not assessed. Virtual visit. Wears corrective lenses. Followed by their ophthalmologist every 6 months. Glaucoma; drops in use.   Dental: Visits every 6 months.    Hearing: Demonstrates normal hearing during visit.  Safety:  Patient feels safe at home- yes Patient does have smoke detectors at home- yes Patient does wear sunscreen or protective  clothing when in direct sunlight - yes Patient does wear seat belt when in a moving vehicle - yes Patient drives- limited; never alone. Mother to drive her most of the time. Adequate lighting in walkways free from debris- yes Grab bars and handrails used as appropriate- yes Ambulates with cane assistive device Cell phone on person when ambulating outside of the home- yes  Social: Alcohol intake - no     Smoking history- never   Smokers in home? none Illicit drug use? none  Depression: Followed by psychiatry and psychology.   Falls: See screening below.    Medication: Taking as directed and without issues. She uses her beauty bag to assist with managing.   Covid-19: Precautions and sickness symptoms discussed. Wears mask, social distancing, hand hygiene as appropriate.   Activities of Daily Living Patient denies needing assistance with: feeding themselves, getting from bed to chair, getting to the toilet, bathing/showering, dressing, managing money. Assisted by mother with household chores, meal prep, bathing, dressing as needed.   Memory: Patient is alert. States she is followed by neurology with memory exams completed every 3 months. MMSE declined today.   BMI- discussed the importance of a healthy diet, water intake and the benefits of aerobic exercise.  Limited. Educational material provided.  Physical activity- walking her dog daily, 20 minutes  Diet:  Low salt; lactose free Water: 64 ounces  Other Providers Patient Care Team: McLean-Scocuzza, Nino Glow, MD as PCP - General (Internal Medicine)  Hearing/Vision screen No exam data present  Dietary issues and exercise activities discussed: Current Exercise Habits: Home exercise routine, Type of exercise: walking, Time (Minutes): 20, Frequency (Times/Week): 7, Weekly Exercise (Minutes/Week): 140, Intensity: Mild  Goals    . Follow up with Primary Care Provider     As needed      Depression Screen PHQ 2/9 Scores  09/27/2019 07/20/2019 05/08/2019 04/26/2019 10/06/2018 07/06/2018 11/03/2017  PHQ - 2 Score - 0 0 0 0 0 1  PHQ- 9 Score - - - - - - 14  Exception Documentation Other- indicate reason in comment box - - - - - -    Fall Risk Fall Risk  09/27/2019 08/01/2019 07/20/2019 07/10/2019 05/08/2019  Falls in the past year? 0 (No Data) 0 (No Data) 1  Comment - no falls since previous visit - no falls since previous visit -  Number falls in past yr: - - 0 - 1  Injury with Fall? - - - - 1  Comment - - - - head injury  Risk for fall due to : - Impaired balance/gait - - History of fall(s);Impaired balance/gait  Follow up - Falls prevention discussed Falls evaluation completed - Falls prevention discussed   Timed Get Up and Go Performed no, virtual visit  Cognitive Function:        Screening Tests Health Maintenance  Topic Date Due  .  OPHTHALMOLOGY EXAM  08/22/2014  . PAP SMEAR-Modifier  07/01/2021  . MAMMOGRAM  08/22/2021  . COLONOSCOPY  12/17/2024  . TETANUS/TDAP  10/19/2026  . INFLUENZA VACCINE  Completed  . PNEUMOCOCCAL POLYSACCHARIDE VACCINE AGE 84-64 HIGH RISK  Completed  . Hepatitis C Screening  Completed  . HIV Screening  Completed  . FOOT EXAM  Discontinued  . HEMOGLOBIN A1C  Discontinued  . URINE MICROALBUMIN  Discontinued     Plan:   Keep all routine maintenance appointments.   Follow up 11/17/19@ 230  Medicare Attestation I have personally reviewed: The patient's medical and social history Their use of alcohol, tobacco or illicit drugs Their current medications and supplements The patient's functional ability including ADLs,fall risks, home safety risks, cognitive, and hearing and visual impairment Diet and physical activities Evidence for depression   In addition, I have reviewed and discussed with patient certain preventive protocols, quality metrics, and best practice recommendations. A written personalized care plan for preventive services as well as general preventive health  recommendations were provided to patient via mail.     Varney Biles, LPN   [id number]     Agree  [place]

## 2019-10-03 ENCOUNTER — Other Ambulatory Visit: Payer: Self-pay | Admitting: Student

## 2019-10-03 DIAGNOSIS — K760 Fatty (change of) liver, not elsewhere classified: Secondary | ICD-10-CM

## 2019-10-04 ENCOUNTER — Telehealth: Payer: Self-pay

## 2019-10-04 NOTE — Telephone Encounter (Signed)
Copied from [place] [id number] [id number]. Topic: Appointment Scheduling - Scheduling Inquiry for Clinic >> Oct 04, 2019  9:53 AM Oneta Rack wrote: Reason for CRM:  Patient would like her CPE prior to the new year due to her insurance, patient would like to be work in due to PCP not having any physical availability prior to January

## 2019-10-09 NOTE — Telephone Encounter (Signed)
Patient calling back to see if she can be worked in due to her insurance is requesting her to have a CPE by the end of this year. Please call patient back, thanks.

## 2019-10-10 ENCOUNTER — Encounter: Payer: Self-pay | Admitting: Emergency Medicine

## 2019-10-10 ENCOUNTER — Emergency Department: Payer: Medicare Other

## 2019-10-10 ENCOUNTER — Emergency Department
Admission: EM | Admit: 2019-10-10 | Discharge: 2019-10-10 | Disposition: A | Discharge status: Home / Self Care | Payer: Medicare Other | Attending: Student in an Organized Health Care Education/Training Program | Admitting: Student in an Organized Health Care Education/Training Program

## 2019-10-10 ENCOUNTER — Other Ambulatory Visit: Payer: Self-pay

## 2019-10-10 DIAGNOSIS — G20 Parkinson's disease: Secondary | ICD-10-CM | POA: Diagnosis not present

## 2019-10-10 DIAGNOSIS — Z79899 Other long term (current) drug therapy: Secondary | ICD-10-CM | POA: Insufficient documentation

## 2019-10-10 DIAGNOSIS — R0789 Other chest pain: Secondary | ICD-10-CM | POA: Diagnosis present

## 2019-10-10 LAB — BASIC METABOLIC PANEL
Anion gap: 9 (ref 5–15)
BUN: 17 mg/dL (ref 6–20)
CO2: 23 mmol/L (ref 22–32)
Calcium: 8.7 mg/dL — ABNORMAL LOW (ref 8.9–10.3)
Chloride: 108 mmol/L (ref 98–111)
Creatinine, Ser: 0.87 mg/dL (ref 0.44–1.00)
GFR calc Af Amer: >60 mL/min (ref >60)
GFR calc non Af Amer: >60 mL/min (ref >60)
Glucose, Bld: 112 mg/dL — ABNORMAL HIGH (ref 70–99)
Potassium: 3.7 mmol/L (ref 3.5–5.1)
Sodium: 140 mmol/L (ref 135–145)

## 2019-10-10 LAB — TROPONIN I (HIGH SENSITIVITY)
Troponin I (High Sensitivity): 2 ng/L (ref <18)
Troponin I (High Sensitivity): 3 ng/L (ref <18)

## 2019-10-10 LAB — CBC
HCT: 36.7 % (ref 36.0–46.0)
Hemoglobin: 11.8 g/dL — ABNORMAL LOW (ref 12.0–15.0)
MCH: 28.2 pg (ref 26.0–34.0)
MCHC: 32.2 g/dL (ref 30.0–36.0)
MCV: 87.6 fL (ref 80.0–100.0)
Platelets: 146 10*3/uL — ABNORMAL LOW (ref 150–400)
RBC: 4.19 MIL/uL (ref 3.87–5.11)
RDW: 13.7 % (ref 11.5–15.5)
WBC: 4 10*3/uL (ref 4.0–10.5)
nRBC: 0 % (ref 0.0–0.2)

## 2019-10-10 LAB — FIBRIN DERIVATIVES D-DIMER (ARMC ONLY): Fibrin derivatives D-dimer (ARMC): 505.5 ng/mL (FEU) — ABNORMAL HIGH (ref 0.00–499.00)

## 2019-10-10 MED ORDER — IOHEXOL 350 MG/ML SOLN
75.0000 mL | Freq: Once | INTRAVENOUS | Status: AC | PRN
  Administered 2019-10-10: 75 mL via INTRAVENOUS

## 2019-10-10 MED ORDER — LORAZEPAM 0.5 MG PO TABS
0.5000 mg | ORAL_TABLET | Freq: Once | ORAL | Status: AC
  Administered 2019-10-10: 0.5 mg via ORAL
  Filled 2019-10-10: qty 1

## 2019-10-10 NOTE — ED Notes (Signed)

## 2019-10-10 NOTE — ED Triage Notes (Signed)
Pt c/o CP that started x1hr ago. PT states pain radiating to LFT shoulder. VS

## 2019-10-10 NOTE — ED Provider Notes (Signed)
[facility] Emergency Department Provider Note    First MD Initiated Contact with Patient 10/10/19 1759     (approximate)  I have reviewed the triage vital signs and the nursing notes.   HISTORY  Chief Complaint Chest Pain    HPI [name] [name] is a 53 y.o. female the below listed past medical history presents the ER for left-sided anterior chest wall pain radiating to her shoulder that started roughly an hour and a half ago.  States is intermittent waxing waning and pressure and achy.  States that she was feeling anxious.  Denies any shortness of breath.  Denies any recent cough or fever.  States the pain is slightly improving but still present.    Past Medical History:  Diagnosis Date  . ADHD   . Anemia   . ANXIETY 06/06/2007  . Arthritis    related to lupus  . Asthma   . BACK PAIN 02/22/2009  . Cervical cancer ([place])    LEEP 2005   . Chicken pox   . Chronic pain    goes to pain management provider  . Collagen vascular disease ([place])   . Connective tissue disease ([place])   . DEPRESSION 06/06/2007  . Essential tremor   . Fibromyalgia   . Fibromyalgia   . FREQUENCY, URINARY 07/07/2007  . Gastritis   . GERD 06/06/2007  . Glaucoma   . Headache    h/o migraines   . History of hiatal hernia   . History of kidney stones   . HYPOTHYROIDISM 06/06/2007  . INTERSTITIAL CYSTITIS 09/11/2010  . Lupus ([place]) 2006  . Lupus ([place])   . Menopause   . Migraine   . Mitral valve regurgitation   . NEPHROLITHIASIS, HX OF 11/05/2010  . Neuropathy   . Neuropathy   . OCD (obsessive compulsive disorder)   . Optic neuritis    2005  . OSTEOPENIA 10/14/2009  . Osteoporosis   . OVERACTIVE BLADDER 10/14/2009  . PAIN, CHRONIC NEC 06/06/2007  . Panniculitis   . Parkinson disease ([place]) 04/03/2019  . POLYARTHRITIS 08/19/2007  . Raynaud's disease   . Sicca syndrome ([place])   . 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 Vaginal prolapse    Dr. Marcelline Mates [facility]   . Vasculitis  ([place])   . WEIGHT GAIN 02/22/2009   Family History  Problem Relation Age of Onset  . Hypertension Mother   . Arthritis Mother   . Heart disease Mother        afib  . Asthma Sister   . Asthma Daughter   . Arthritis Daughter   . Heart disease Daughter        ?heart condition on BB  . ADD / ADHD Daughter   . Pancreatic cancer Father   . Cancer Father        pancreatitic   . Diabetes Maternal Grandmother   . Heart disease Maternal Grandmother   . Arthritis Maternal Grandmother   . Hypertension Maternal Grandmother   . Colon cancer Maternal Uncle 25  . Crohn's disease Maternal Aunt   . Diabetes Maternal Aunt   . Breast cancer Cousin 51       maternal  . Cancer Cousin        m cousin breat cancer s/p removal both breasts    Past Surgical History:  Procedure Laterality Date  . APPENDECTOMY    . CERVICAL BIOPSY  W/ LOOP ELECTRODE EXCISION  2005  . CHOLECYSTECTOMY    . ESOPHAGOGASTRODUODENOSCOPY (EGD) WITH PROPOFOL  N/A 08/27/2017   Procedure: ESOPHAGOGASTRODUODENOSCOPY (EGD) WITH PROPOFOL;  Surgeon: Manya Silvas, MD;  Location: [facility] ENDOSCOPY;  Service: Endoscopy;  Laterality: N/A;  . HAND SURGERY     repair of left metatarsal   . HARDWARE REMOVAL Right 02/16/2017   Procedure: HARDWARE REMOVAL FROM HIP;  Surgeon: Hessie Knows, MD;  Location: [facility] ORS;  Service: Orthopedics;  Laterality: Right;  . JOINT REPLACEMENT    . OTHER SURGICAL HISTORY     left 3rd metatarsal fracture repair 2011   . REVISION TOTAL HIP ARTHROPLASTY  06/2009   replacement 2010 then screw and plate removal [id number]; right hip  . TONSILLECTOMY    . TONSILLECTOMY    . wrist  ligament repair bilateral    . WRIST RECONSTRUCTION    . WRIST SURGERY     laceration of right wrist ligaments  . wrist surgery     laceration of left wrist surgery    Patient Active Problem List   Diagnosis Date Noted  . Vitamin D deficiency 07/04/2019  . Anemia 07/04/2019  . Chronic venous insufficiency 04/18/2019  . Lymphedema  04/18/2019  . Parkinson disease ([place]) 04/03/2019  . Connective tissue disorder ([place]) 02/14/2019  . RUQ abdominal pain 02/14/2019  . Hiatal hernia 10/06/2018  . ADHD 08/04/2018  . Lupus arthritis ([place]) 08/04/2018  . Asthma 08/04/2018  . Systemic lupus erythematosus ([place]) 08/04/2018  . Urinary incontinence 08/04/2018  . Glaucoma 08/04/2018  . Migraines 08/04/2018  . Mitral valve regurgitation 08/04/2018  . Neuropathy 08/04/2018  . OCD (obsessive compulsive disorder) 08/04/2018  . Osteopenia 08/04/2018  . Thrombocytopenia ([place]) 07/06/2018  . Chest pain 07/04/2018  . Allergic rhinitis 12/16/2017  . Dizziness 12/16/2017  . Orthostatic hypotension 12/16/2017  . Generalized abdominal pain 12/16/2017  . Constipation 12/16/2017  . Acute nonintractable headache 12/13/2017  . Fibromyalgia 11/04/2017  . Status post hardware removal 02/16/2017  . Sicca syndrome ([place]) 06/04/2014  . Abdominal pain, epigastric 09/08/2013  . Chronic pain 03/31/2011  . NEPHROLITHIASIS, HX OF 11/05/2010  . INTERSTITIAL CYSTITIS 09/11/2010  . Overactive bladder 10/14/2009  . Disorder of bone and cartilage 10/14/2009  . Backache 02/22/2009  . WEIGHT GAIN 02/22/2009  . 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 Polyarthropathy or polyarthritis of multiple sites 08/19/2007  . FREQUENCY, URINARY 07/07/2007  . Hypothyroidism 06/06/2007  . Anxiety state 06/06/2007  . Depression, recurrent ([place]) 06/06/2007  . PAIN, CHRONIC NEC 06/06/2007  . GERD 06/06/2007      Prior to Admission medications   Medication Sig Start Date End Date Taking? Authorizing Provider  albuterol (VENTOLIN HFA) 108 (90 Base) MCG/ACT inhaler Inhale 1-2 puffs into the lungs every 4 (four) hours as needed for wheezing or shortness of breath. 10/06/18   McLean-Scocuzza, Nino Glow, MD  ARIPiprazole (ABILIFY) 20 MG tablet Take 20 mg by mouth daily.    [provider]  Ascorbic Acid (VITAMIN C) 500 MG CHEW Chew 2,500 mg by mouth daily.     [provider]  aspirin 325 MG tablet Take 1 tablet (325 mg total) by mouth daily. 02/18/17   Duanne Guess, PA-C  Biotin 10 MG CAPS Take by mouth.    [provider]  Biotin 5000 MCG SUBL Place 10,000 mcg under the tongue daily.    [provider]  calcipotriene (DOVONOX) 0.005 % ointment calcipotriene 0.005 % topical ointment  APPLY A THIN LAYER TO THE AFFECTED AREA(S) BY TOPICAL ROUTE ONCE DAILY ; RUB IN GENTLY AND COMPLETELY    [provider]  carbidopa-levodopa (SINEMET IR) 10-100 MG tablet Take 0.5 tablets by mouth 3 (three) times daily.    [provider]  Cholecalciferol (VITAMIN D3) 2000 units capsule Vitamin D3 2,000 unit capsule    [provider]  dapsone 25 MG tablet Take by mouth daily.     [provider]  dexmethylphenidate (FOCALIN XR) 20 MG 24 hr capsule dexmethylphenidate ER 20 mg capsule,extended release biphasic50-50    [provider]  diclofenac sodium (VOLTAREN) 1 % GEL Apply 2 g topically 4 (four) times daily as needed (for knee & finger pain.).     [provider]  dicyclomine (BENTYL) 10 MG capsule dicyclomine 10 mg capsule  every 8hours as needed    [provider]  doxycycline (VIBRA-TABS) 100 MG tablet Take 1 tablet (100 mg total) by mouth 2 (two) times daily. With food 03/15/19   McLean-Scocuzza, Nino Glow, MD  DULoxetine (CYMBALTA) 60 MG capsule Take 60 mg by mouth 2 (two) times daily.    [provider]  Elastic Bandages & Supports (MEDICAL COMPRESSION STOCKINGS) MISC Please provide 28mmHg compression stockings 02/09/19   Harvest Dark, MD  fentaNYL (DURAGESIC - DOSED MCG/HR) 75 MCG/HR Place 75 mcg onto the skin every 3 (three) days.    [provider]  Ferrous Sulfate (IRON PO) Take by mouth.    [provider]  fluconazole (DIFLUCAN) 150 MG tablet Take 1 tablet (150 mg total) by mouth once a week. Repeat another dose in another week x 1 pill  (over 2 weeks) 04/26/19   McLean-Scocuzza, Nino Glow, MD  fluocinonide ointment (LIDEX) 0.05 % fluocinonide 0.05 % topical ointment  APPLY TO THE AFFECTED AREA(S) BY TOPICAL ROUTE 2 TIMES PER DAY    [provider]  Fluticasone-Salmeterol (ADVAIR DISKUS) 250-50 MCG/DOSE AEPB Inhale 1 puff into the lungs 2 (two) times daily. Rinse mouth 08/17/19   McLean-Scocuzza, Nino Glow, MD  folic acid (FOLVITE) 1 MG tablet Take 1 tablet (1 mg total) by mouth daily. 07/06/18   McLean-Scocuzza, Nino Glow, MD  furosemide (LASIX) 20 MG tablet Take 20 mg by mouth daily as needed.  02/08/19 02/08/20  [provider]  gabapentin (NEURONTIN) 100 MG capsule Take by mouth. 05/11/19 05/10/20  [provider]  hydrocortisone (PROCTOZONE-HC) 2.5 % rectal cream Proctozone-HC 2.5 % topical cream perineal applicator    [provider]  hydroxychloroquine (PLAQUENIL) 200 MG tablet Plaquenil 200 mg tablet  Take 1 tablet twice a day by oral route after meals for 90 days.    [provider]  ibuprofen (ADVIL,MOTRIN) 800 MG tablet Take 1 tablet (800 mg total) by mouth every 8 (eight) hours as needed. 08/04/17   Laban Emperor, PA-C  imiquimod (ALDARA) 5 % cream imiquimod 5 % topical cream packet  APPLY TO THE AFFECTED AREA(S) BY TOPICAL ROUTE 3 TIMES PER WEEK at bedtime    [provider]  isosorbide mononitrate (IMDUR) 60 MG 24 hr tablet Take 1 tablet (60 mg total) by mouth daily. 05/26/18   Carrie Mew, MD  levothyroxine (SYNTHROID, LEVOTHROID) 25 MCG tablet Take 1 tablet (25 mcg total) by mouth daily before breakfast. 10/06/18   McLean-Scocuzza, Nino Glow, MD  LORazepam (ATIVAN) 2 MG tablet Take 1-2 mg by mouth See admin instructions. 1 MG DAILY AS NEEDED FOR ANXIETY & SCHEDULED AT BEDTIME EACH NIGHT    [provider]  meclizine (ANTIVERT) 25 MG tablet Take 1 tablet (25 mg total) by mouth 3 (three) times daily as needed for dizziness or  nausea. 12/26/18   [name], MD   methylphenidate 18 MG PO CR tablet Take by mouth.     [provider]  montelukast (SINGULAIR) 10 MG tablet montelukast 10 mg tablet 1 pill qhs 12/30/18   McLean-Scocuzza, Nino Glow, MD  Multiple Vitamins-Minerals (MULTI-VITAMIN GUMMIES PO) Multi Vitamin  GUMMIES    [provider]  omeprazole (PRILOSEC) 40 MG capsule Take 40 mg by mouth 2 (two) times daily.    [provider]  ondansetron (ZOFRAN) 4 MG tablet Zofran 8 mg tablet  Take 1 tablet every 8 hours by oral route as needed.    [provider]  pantoprazole (PROTONIX) 40 MG tablet Take one tablet by mouth twice daily Patient taking differently: TAKE 1 TABLET (40 MG) BY MOUTH ONCE DAILY IN THE MORNING. 12/18/13   Marletta Lor, MD  PARoxetine (PAXIL) 10 MG tablet Take 1 tablet (10 mg total) by mouth at bedtime. 07/04/19   Rubie Maid, MD  polyethylene glycol powder (GLYCOLAX/MIRALAX) powder Take 17 g by mouth daily. 03/29/18   Harvest Dark, MD  Polyvinyl Alcohol-Povidone (REFRESH OP) Place 1 drop into both eyes 2 (two) times daily.    [provider]  predniSONE (DELTASONE) 10 MG tablet prednisone 10 mg tablet  1 daily    [provider]  Probiotic Product (PROBIOTIC PO) Probiotic 10 billion cell capsule  daily    [provider]  silver sulfADIAZINE (SILVADENE) 1 % cream silver sulfadiazine 1 % topical cream    [provider]  Sodium Chloride 3 % AERS Saline Nasal Mist 3 %  spray nose deeply every hour or two and blow nose    [provider]  sucralfate (CARAFATE) 1 g tablet sucralfate 1 gram tablet    [provider]  terbinafine (LAMISIL AT) 1 % cream Apply 1 application topically 2 (two) times daily. 04/26/19   McLean-Scocuzza, Nino Glow, MD  topiramate (TOPAMAX) 100 MG tablet 100-150 mg. 100 in am and 150 qhs    [provider]  valACYclovir (VALTREX) 500 MG tablet valacyclovir 500 mg tablet  1 daily    [provider]     Allergies Bee venom, Fish allergy, Latex, Prasterone, Shellfish allergy, Shellfish-derived products, Compazine [prochlorperazine edisylate], Dhea [nutritional supplements], Dilaudid [hydromorphone hcl], Lactose, Lactose intolerance (gi), Other, Prochlorperazine, Promethazine, Promethazine hcl, Reclast [zoledronic acid], Clarithromycin, Clotrimazole, Hydromorphone hcl, and Penicillins    Social History Social History   Tobacco Use  . Smoking status: Never Smoker  . Smokeless tobacco: Never Used  Substance Use Topics  . Alcohol use: No  . Drug use: No    Review of Systems Patient denies headaches, rhinorrhea, blurry vision, numbness, shortness of breath, chest pain, edema, cough, abdominal pain, nausea, vomiting, diarrhea, dysuria, fevers, rashes or hallucinations unless otherwise stated above in HPI. ____________________________________________   PHYSICAL EXAM:  VITAL SIGNS: Vitals:   10/10/19 1940 10/10/19 2002  BP: (!) 120/96 94/61  Pulse: 85 91  Resp: 18 16  Temp:    SpO2: 99% 100%    Constitutional: Alert and oriented.  Eyes: Conjunctivae are normal.  Head: Atraumatic. Nose: No congestion/rhinnorhea. Mouth/Throat: Mucous membranes are moist.   Neck: No stridor. Painless ROM.  Cardiovascular: Normal rate, regular rhythm. Grossly normal heart sounds.  Good peripheral circulation. Respiratory: Normal respiratory effort.  No retractions. Lungs CTAB. Gastrointestinal: Soft and nontender. No distention. No abdominal bruits. No CVA tenderness. Genitourinary:  Musculoskeletal: No lower extremity tenderness nor edema.  No joint effusions. Neurologic:  Normal  speech and language. No gross focal neurologic deficits are appreciated. No facial droop Skin:  Skin is warm, dry and intact. No rash noted. Psychiatric: Mood and affect are normal. Speech and behavior are normal.  ____________________________________________   LABS (all labs ordered are listed, but only abnormal  results are displayed)  Results for orders placed or performed during the hospital encounter of 10/10/19 (from the past 24 hour(s))  Basic metabolic panel     Status: Abnormal   Collection Time: 10/10/19  5:18 PM  Result Value Ref Range   Sodium 140 135 - 145 mmol/L   Potassium 3.7 3.5 - 5.1 mmol/L   Chloride 108 98 - 111 mmol/L   CO2 23 22 - 32 mmol/L   Glucose, Bld 112 (H) 70 - 99 mg/dL   BUN 17 6 - 20 mg/dL   Creatinine, Ser 0.87 0.44 - 1.00 mg/dL   Calcium 8.7 (L) 8.9 - 10.3 mg/dL   GFR calc non Af Amer >60 >60 mL/min   GFR calc Af Amer >60 >60 mL/min   Anion gap 9 5 - 15  CBC     Status: Abnormal   Collection Time: 10/10/19  5:18 PM  Result Value Ref Range   WBC 4.0 4.0 - 10.5 K/uL   RBC 4.19 3.87 - 5.11 MIL/uL   Hemoglobin 11.8 (L) 12.0 - 15.0 g/dL   HCT 36.7 36.0 - 46.0 %   MCV 87.6 80.0 - 100.0 fL   MCH 28.2 26.0 - 34.0 pg   MCHC 32.2 30.0 - 36.0 g/dL   RDW 13.7 11.5 - 15.5 %   Platelets 146 (L) 150 - 400 K/uL   nRBC 0.0 0.0 - 0.2 %  Troponin I (High Sensitivity)     Status: None   Collection Time: 10/10/19  5:18 PM  Result Value Ref Range   Troponin I (High Sensitivity) 2 <18 ng/L  Fibrin derivatives D-Dimer ([facility] only)     Status: Abnormal   Collection Time: 10/10/19  6:11 PM  Result Value Ref Range   Fibrin derivatives D-dimer ([facility]) 505.50 (H) 0.00 - 499.00 ng/mL (FEU)  Troponin I (High Sensitivity)     Status: None   Collection Time: 10/10/19  7:44 PM  Result Value Ref Range   Troponin I (High Sensitivity) 3 <18 ng/L   ____________________________________________  EKG My review and personal interpretation at Time: 17:19   Indication: chest pain  Rate: 100  Rhythm: sinus Axis: normal Other: normal intervals, no stemi ____________________________________________  RADIOLOGY  I personally reviewed all radiographic images ordered to evaluate for the above acute complaints and reviewed radiology reports and findings.  These findings were personally  discussed with the patient.  Please see medical record for radiology report.  ____________________________________________   PROCEDURES  Procedure(s) performed:  Procedures    Critical Care performed: no ____________________________________________   INITIAL IMPRESSION / ASSESSMENT AND PLAN / ED COURSE  Pertinent labs & imaging results that were available during my care of the patient were reviewed by me and considered in my medical decision making (see chart for details).   DDX: ACS, pericarditis, esophagitis, boerhaaves, pe, dissection, pna, bronchitis, costochondritis   [name] [name] is a 53 y.o. who presents to the ED with chest discomfort as described above.  Patient well-appearing somewhat anxious appearing no acute distress.  Protecting her airway.  Pain is not reproduced with palpation.  EKG is nonischemic.  Initial troponin is negative but will order serial enzymes to further restratify.  Have a lower suspicion  for ACS.  Will order D-dimer to further stratify for PE as she is otherwise low risk by Wells criteria.  Her abdominal exam is soft and benign.  Clinical Course as of Oct 10 2019  Tue Oct 10, 2019  2017 Patient reassessed.  Work-up thus far has been reassuring.  She denies any pain at this time.  Does not seem consistent with ACS.  No evidence of PE.  No evidence of pneumonia.  No lower extremity swelling to suggest DVT.  D-dimer is only borderline elevated.  At this point do not feel that hospitalization or further diagnostic testing clinically indicated and that she is appropriate and stable for outpatient work-up.   [PR]    Clinical Course User Index [PR] Merlyn Lot, MD    The patient was evaluated in Emergency Department today for the symptoms described in the history of present illness. He/she was evaluated in the context of the global COVID-19 pandemic, which necessitated consideration that the patient might be at risk for infection with the  SARS-CoV-2 virus that causes COVID-19. Institutional protocols and algorithms that pertain to the evaluation of patients at risk for COVID-19 are in a state of rapid change based on information released by regulatory bodies including the CDC and federal and state organizations. These policies and algorithms were followed during the patient's care in the ED.  As part of my medical decision making, I reviewed the following data within the [facility] notes reviewed and incorporated, Labs reviewed, notes from prior ED visits and  Controlled Substance Database   ____________________________________________   FINAL CLINICAL IMPRESSION(S) / ED DIAGNOSES  Final diagnoses:  Atypical chest pain      NEW MEDICATIONS STARTED DURING THIS VISIT:  New Prescriptions   No medications on file     Note:  This document was prepared using Dragon voice recognition software and may include unintentional dictation errors.    Merlyn Lot, MD 10/10/19 2020

## 2019-10-10 NOTE — ED Notes (Signed)
Report off to [place]

## 2019-10-16 ENCOUNTER — Ambulatory Visit
Admission: RE | Admit: 2019-10-16 | Discharge: 2019-10-16 | Disposition: A | Discharge status: Home / Self Care | Payer: Medicare Other | Source: Ambulatory Visit | Attending: Student | Admitting: Student

## 2019-10-16 ENCOUNTER — Other Ambulatory Visit: Payer: Self-pay

## 2019-10-16 DIAGNOSIS — K760 Fatty (change of) liver, not elsewhere classified: Secondary | ICD-10-CM | POA: Insufficient documentation

## 2019-11-17 ENCOUNTER — Ambulatory Visit (INDEPENDENT_AMBULATORY_CARE_PROVIDER_SITE_OTHER): Payer: Medicare Other | Admitting: Internal Medicine

## 2019-11-17 ENCOUNTER — Other Ambulatory Visit: Payer: Self-pay

## 2019-11-17 ENCOUNTER — Encounter: Payer: Self-pay | Admitting: Internal Medicine

## 2019-11-17 VITALS — Ht 68.0 in | Wt 208.0 lb

## 2019-11-17 DIAGNOSIS — R109 Unspecified abdominal pain: Secondary | ICD-10-CM

## 2019-11-17 DIAGNOSIS — Z0001 Encounter for general adult medical examination with abnormal findings: Secondary | ICD-10-CM

## 2019-11-17 DIAGNOSIS — Z13220 Encounter for screening for lipoid disorders: Secondary | ICD-10-CM

## 2019-11-17 DIAGNOSIS — G629 Polyneuropathy, unspecified: Secondary | ICD-10-CM

## 2019-11-17 DIAGNOSIS — J309 Allergic rhinitis, unspecified: Secondary | ICD-10-CM

## 2019-11-17 DIAGNOSIS — G5600 Carpal tunnel syndrome, unspecified upper limb: Secondary | ICD-10-CM | POA: Insufficient documentation

## 2019-11-17 DIAGNOSIS — Z0000 Encounter for general adult medical examination without abnormal findings: Secondary | ICD-10-CM | POA: Insufficient documentation

## 2019-11-17 DIAGNOSIS — D696 Thrombocytopenia, unspecified: Secondary | ICD-10-CM

## 2019-11-17 DIAGNOSIS — R140 Abdominal distension (gaseous): Secondary | ICD-10-CM

## 2019-11-17 DIAGNOSIS — E039 Hypothyroidism, unspecified: Secondary | ICD-10-CM

## 2019-11-17 DIAGNOSIS — G43909 Migraine, unspecified, not intractable, without status migrainosus: Secondary | ICD-10-CM

## 2019-11-17 DIAGNOSIS — R194 Change in bowel habit: Secondary | ICD-10-CM

## 2019-11-17 DIAGNOSIS — E559 Vitamin D deficiency, unspecified: Secondary | ICD-10-CM

## 2019-11-17 DIAGNOSIS — G20 Parkinson's disease: Secondary | ICD-10-CM

## 2019-11-17 NOTE — Progress Notes (Signed)
Virtual Visit via Video Note  I connected with [name] [name]  on 11/17/19 at  2:37 PM EST by a video enabled telemedicine application and verified that I am speaking with the correct person using two identifiers.  Location patient:car Location provider:work or home office Persons participating in the virtual visit: patient, provider  I discussed the limitations of evaluation and management by telemedicine and the availability of in person appointments. The patient expressed understanding and agreed to proceed.   HPI: Annual with chronic medication conditions   1. C/o worsening burning/numbness like pins in needles legs and feet nothing tried  She also has h/o migraines which have reduced in frequency being on topamax 100 mg in am and 150 mg qhs and she f/u with Dr. Manuella Ghazi   2. Dermatitis feet 2 weeks ago given topical urea by Dermatology on [address]  3. UCTD f/u [facility] will f/u 01/2020 the rec continue dapson 25 mg qd and HCQ 400 mg qd and freq eye exam she had eye exam 10/2019 and will go back 01/28/20 for more specific testing on HCQ. She has joint pain and chronic pain   4. She c/o abdominal bloating and urgency to stool x 2 months and abdominal pain f/u with [facility] will have her see them for this the MD on protonix 40 mg bid and bentyl 10 mg qd    ROS: See pertinent positives and negatives per HPI. General: wt stable  HEENT: nl hearing  CV: no chest pain  Lungs: asthma controlled no issues, no sob GI: abdominal pain and bloating x 2 months stool urgency +  GU: no issues  MSK: chronic pain  Psych: +anxiety Neuro: denies dizziness, reduced freq migraines, h/o PD f/u neurology  Skin: skin on feet doing better    Past Medical History:  Diagnosis Date  . ADHD   . Anemia   . ANXIETY 06/06/2007  . Arthritis    related to lupus  . Asthma   . BACK PAIN 02/22/2009  . Cervical cancer ([place])    LEEP 2005   . Chicken pox   . Chronic pain    goes to pain management  provider  . Collagen vascular disease ([place])   . Connective tissue disease ([place])   . DEPRESSION 06/06/2007  . Essential tremor   . Fibromyalgia   . Fibromyalgia   . FREQUENCY, URINARY 07/07/2007  . Gastritis   . GERD 06/06/2007  . Glaucoma   . Headache    h/o migraines   . History of hiatal hernia   . History of kidney stones   . HYPOTHYROIDISM 06/06/2007  . INTERSTITIAL CYSTITIS 09/11/2010  . Lupus ([place]) 2006  . Lupus ([place])   . Menopause   . Migraine   . Mitral valve regurgitation   . NEPHROLITHIASIS, HX OF 11/05/2010  . Neuropathy   . Neuropathy   . OCD (obsessive compulsive disorder)   . Optic neuritis    2005  . OSTEOPENIA 10/14/2009  . Osteoporosis   . OVERACTIVE BLADDER 10/14/2009  . PAIN, CHRONIC NEC 06/06/2007  . Panniculitis   . Parkinson disease ([place]) 04/03/2019  . POLYARTHRITIS 08/19/2007  . Raynaud's disease   . Sicca syndrome ([place])   . 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 Vaginal prolapse    Dr. Marcelline Mates [facility]   . Vasculitis ([place])   . WEIGHT GAIN 02/22/2009    Past Surgical History:  Procedure Laterality Date  . APPENDECTOMY    . CERVICAL BIOPSY  W/ LOOP ELECTRODE  EXCISION  2005  . CHOLECYSTECTOMY    . ESOPHAGOGASTRODUODENOSCOPY (EGD) WITH PROPOFOL N/A 08/27/2017   Procedure: ESOPHAGOGASTRODUODENOSCOPY (EGD) WITH PROPOFOL;  Surgeon: Manya Silvas, MD;  Location: [facility] ENDOSCOPY;  Service: Endoscopy;  Laterality: N/A;  . HAND SURGERY     repair of left metatarsal   . HARDWARE REMOVAL Right 02/16/2017   Procedure: HARDWARE REMOVAL FROM HIP;  Surgeon: Hessie Knows, MD;  Location: [facility] ORS;  Service: Orthopedics;  Laterality: Right;  . JOINT REPLACEMENT    . OTHER SURGICAL HISTORY     left 3rd metatarsal fracture repair 2011   . REVISION TOTAL HIP ARTHROPLASTY  06/2009   replacement 2010 then screw and plate removal 9735; right hip  . TONSILLECTOMY    . TONSILLECTOMY    . wrist  ligament repair bilateral    . WRIST RECONSTRUCTION    . WRIST SURGERY      laceration of right wrist ligaments  . wrist surgery     laceration of left wrist surgery     Family History  Problem Relation Age of Onset  . Hypertension Mother   . Arthritis Mother   . Heart disease Mother        afib  . Asthma Sister   . Asthma Daughter   . Arthritis Daughter   . Heart disease Daughter        ?heart condition on BB  . ADD / ADHD Daughter   . Pancreatic cancer Father   . Cancer Father        pancreatitic   . Diabetes Maternal Grandmother   . Heart disease Maternal Grandmother   . Arthritis Maternal Grandmother   . Hypertension Maternal Grandmother   . Colon cancer Maternal Uncle 42  . Crohn's disease Maternal Aunt   . Diabetes Maternal Aunt   . Breast cancer Cousin 3       maternal  . 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  albuterol (VENTOLIN HFA) 108 (90 Base) MCG/ACT inhaler, Inhale 1-2 puffs into the lungs every 4 (four) hours as needed for wheezing or shortness of breath., Disp: 1 Inhaler, Rfl: 12 .  ARIPiprazole (ABILIFY) 20 MG tablet, Take 20 mg by mouth daily., Disp: , Rfl:  .  Ascorbic Acid (VITAMIN C) 500 MG CHEW, Chew 2,500 mg by mouth daily., Disp: , Rfl:  .  aspirin 325 MG tablet, Take 1 tablet (325 mg total) by mouth daily., Disp: 30 tablet, Rfl: 0 .  Biotin 10 MG CAPS, Take by mouth., Disp: , Rfl:  .  Biotin 5000 MCG SUBL, Place 10,000 mcg under the tongue daily., Disp: , Rfl:  .  calcipotriene (DOVONOX) 0.005 % ointment, calcipotriene 0.005 % topical ointment  APPLY A THIN LAYER TO THE AFFECTED AREA(S) BY TOPICAL ROUTE ONCE DAILY ; RUB IN GENTLY AND COMPLETELY, Disp: , Rfl:  .  carbidopa-levodopa (SINEMET IR) 10-100 MG tablet, Take 0.5 tablets by mouth 3 (three) times daily., Disp: , Rfl:  .  Cholecalciferol (VITAMIN D3) 2000 units  capsule, Vitamin D3 2,000 unit capsule, Disp: , Rfl:  .  dapsone 25 MG tablet, Take by mouth daily. , Disp: , Rfl:  .  dexmethylphenidate (FOCALIN XR) 20 MG 24 hr capsule, dexmethylphenidate ER 20  mg capsule,extended release biphasic50-50, Disp: , Rfl:  .  diclofenac sodium (VOLTAREN) 1 % GEL, Apply 2 g topically 4 (four) times daily as needed (for knee & finger pain.). , Disp: , Rfl:  .  dicyclomine (BENTYL) 10 MG capsule, dicyclomine 10 mg capsule  every 8hours as  needed, Disp: , Rfl:  .  doxycycline (VIBRA-TABS) 100 MG tablet, Take 1 tablet (100 mg total) by mouth 2 (two) times daily. With food, Disp: 14 tablet, Rfl: 0 .  DULoxetine (CYMBALTA) 60 MG capsule, Take 60 mg by mouth 2 (two) times daily., Disp: , Rfl:  .  Elastic Bandages & Supports (MEDICAL COMPRESSION STOCKINGS) MISC, Please provide 54mHg compression stockings, Disp: 1 each, Rfl: 0 .  fentaNYL (DURAGESIC - DOSED MCG/HR) 75 MCG/HR, Place 75 mcg onto the skin every 3 (three) days., Disp: , Rfl:  .  Ferrous Sulfate (IRON PO), Take by mouth., Disp: , Rfl:  .  fluconazole (DIFLUCAN) 150 MG tablet, Take 1 tablet (150 mg total) by mouth once a week. Repeat another dose in another week x 1 pill (over 2 weeks), Disp: 2 tablet, Rfl: 0 .  fluocinonide ointment (LIDEX) 0.05 %, fluocinonide 0.05 % topical ointment  APPLY TO THE AFFECTED AREA(S) BY TOPICAL ROUTE 2 TIMES PER DAY, Disp: , Rfl:  .  Fluticasone-Salmeterol (ADVAIR DISKUS) 250-50 MCG/DOSE AEPB, Inhale 1 puff into the lungs 2 (two) times daily. Rinse mouth, Disp: 1 each, Rfl: 12 .  folic acid (FOLVITE) 1 MG tablet, Take 1 tablet (1 mg total) by mouth daily., Disp: 90 tablet, Rfl: 3 .  furosemide (LASIX) 20 MG tablet, Take 20 mg by mouth daily as needed. , Disp: , Rfl:  .  gabapentin (NEURONTIN) 100 MG capsule, Take 100 mg by mouth 3 (three) times daily. , Disp: , Rfl:  .  hydrocortisone (PROCTOZONE-HC) 2.5 % rectal cream, Proctozone-HC 2.5 % topical cream perineal applicator, Disp: ,  Rfl:  .  hydroxychloroquine (PLAQUENIL) 200 MG tablet, Plaquenil 200 mg tablet  Take 1 tablet twice a day by oral route after meals for 90 days., Disp: , Rfl:  .  ibuprofen (ADVIL,MOTRIN) 800 MG tablet, Take 1 tablet (800 mg total) by mouth every 8 (eight) hours as needed., Disp: 30 tablet, Rfl: 0 .  imiquimod (ALDARA) 5 % cream, imiquimod 5 % topical cream packet  APPLY TO THE AFFECTED AREA(S) BY TOPICAL ROUTE 3 TIMES PER WEEK at bedtime, Disp: , Rfl:  .  isosorbide mononitrate (IMDUR) 60 MG 24 hr tablet, Take 1 tablet (60 mg total) by mouth daily., Disp: 20 tablet, Rfl: 0 .  levothyroxine (SYNTHROID, LEVOTHROID) 25 MCG tablet, Take 1 tablet (25 mcg total) by mouth daily before breakfast., Disp: 90 tablet, Rfl: 3 .  LORazepam (ATIVAN) 2 MG tablet, Take 1-2 mg by mouth See admin instructions. 1 MG DAILY AS NEEDED FOR ANXIETY & SCHEDULED AT BEDTIME EACH NIGHT, Disp: , Rfl:  .  meclizine (ANTIVERT) 25 MG tablet, Take 1 tablet (25 mg total) by mouth 3 (three) times daily as needed for dizziness or nausea., Disp: 20 tablet, Rfl: 0 .  methylphenidate 18 MG PO CR tablet, Take by mouth. , Disp: , Rfl:  .  montelukast (SINGULAIR) 10 MG tablet, montelukast 10 mg tablet 1 pill qhs, Disp: 90 tablet, Rfl: 3 .  Multiple Vitamins-Minerals (MULTI-VITAMIN GUMMIES PO), Multi Vitamin  GUMMIES, Disp: , Rfl:  .  omeprazole (PRILOSEC) 40 MG capsule, Take 40 mg by mouth 2 (two) times daily., Disp: , Rfl:  .  ondansetron (ZOFRAN) 4 MG tablet, Zofran 8 mg tablet  Take 1 tablet every 8 hours by oral route as needed., Disp: , Rfl:  .  pantoprazole (PROTONIX) 40 MG tablet, Take one tablet by mouth twice daily (Patient taking differently: TAKE 1 TABLET (40 MG) BY MOUTH ONCE DAILY IN THE MORNING.), Disp: 180 tablet, Rfl: 3 .  PARoxetine (PAXIL) 10 MG tablet, Take 1 tablet (10 mg total) by mouth at bedtime., Disp: 90 tablet, Rfl: 3 .  polyethylene glycol powder (GLYCOLAX/MIRALAX) powder, Take 17 g by mouth daily., Disp: 255 g,  Rfl: 0 .  Polyvinyl Alcohol-Povidone (REFRESH OP), Place 1 drop into both eyes 2 (two) times daily., Disp: , Rfl:  .  predniSONE (DELTASONE) 10 MG tablet, prednisone 10 mg tablet  1 daily, Disp: , Rfl:  .  Probiotic Product (PROBIOTIC PO), Probiotic 10 billion cell capsule  daily, Disp: , Rfl:  .  silver sulfADIAZINE (SILVADENE) 1 % cream, silver sulfadiazine 1 % topical cream, Disp: , Rfl:  .  Sodium Chloride 3 % AERS, Saline Nasal Mist 3 %  spray nose deeply every hour or two and blow nose, Disp: , Rfl:  .  sucralfate (CARAFATE) 1 g tablet, sucralfate 1 gram tablet, Disp: , Rfl:  .  terbinafine (LAMISIL AT) 1 % cream, Apply 1 application topically 2 (two) times daily., Disp: 42 g, Rfl: 2 .  topiramate (TOPAMAX) 100 MG tablet, 100-150 mg. 100 in am and 150 qhs, Disp: , Rfl:  .  valACYclovir (VALTREX) 500 MG tablet, valacyclovir 500 mg tablet  1 daily, Disp: , Rfl:   EXAM:  VITALS per patient if applicable:  GENERAL: alert, oriented, appears well and in no acute distress  HEENT: atraumatic, conjunttiva clear, no obvious abnormalities on inspection of external nose and ears  NECK: normal movements of the head and neck  LUNGS: on inspection no signs of respiratory distress, breathing rate appears normal, no obvious gross SOB, gasping or wheezing  CV: no obvious cyanosis  MS: moves all visible extremities without noticeable abnormality  PSYCH/NEURO: pleasant and cooperative, no obvious depression or anxiety, speech and thought processing grossly intact  ASSESSMENT AND PLAN:  Discussed the following assessment and plan:  Annual physical exam Had flu shot utd Had pna 23 in 2013, prevnar 09/03/2014 Tdap 10/19/16  Think about shingrix vaccine in future prev discussed  Consider repeat pna 23 vaccine in the future  ImmuneMMR and hep B  Colonoscopy 12/17/14 h/o polyps Dr. Rayann Heman IH/EH polyp hyperplastic consider repeat in 5-10 years FH m uncle colon cancer  -sent referral back to GI Dr.  Alice Reichert due to ab pain/bloating and stool urgency worse x 2 months  DEXA 06/29/17 osteopeniah/o osteoporosis  -f/u endocrine [facility] Dr. Julious Oka and he was to try Reclast again though listed on her allergy list he does not think true allergy  Mammogram 08/23/19 negative   Pap8/2/19 Dr. Marcelline Mates neg pap neg HPV  See CT chest   IMPRESSION: 1. No CT evidence for acute pulmonary embolus. 2. Symmetric dependent ground-glass attenuation in the lungs, similar to prior study. Imaging features likely related to hypo expansion/ compressive atelectasis.  Dermatology [facility] saw early 10/2019    rec vitamin D3 4000 Iu daily vitamin D 06/08/19 was 24   Vitamin H85 277 07/23/22  Folic acid >53 05/13/42   Eye exam had 10/2019 will return 01/28/20   Of note [facility] Dr. Nancy Fetter f/u sch in 01/2020, eye exam 01/28/20 to f/u HCQ monitoring  rec healthy  diet and exercise   Neuropathy lower legs with h/o migraines and parkinsons - Plan: Comprehensive metabolic panel, CBC with Differential/Platelet, TSH V74 normal and folic acid normal in the past will sch 01/14/20  -refer back to Neurology try to get sooner appt than 01/2020 for EMG/NCS lower legs and consider scrambler therapy  Disc alpha lipoic acid otc with pt 600 mg bid   Migraines improved on topamax and less frequent   parkinsons it appears thy are trying to approve Ryt[place] ER carbidopa/levodopa instead of IR due to pt was not tolerating Sinemet   Thrombocytopenia (HCC)likely 2/2 meds  - Plan: CBC with Differential/Platelet monitor   -we discussed possible serious and likely etiologies, options for evaluation and workup, limitations of telemedicine visit vs in person visit, treatment, treatment risks and precautions. Pt prefers to treat via telemedicine empirically rather then risking or undertaking an in person visit at this moment. Patient agrees to seek prompt in person care if worsening, new symptoms arise, or if is not improving  with treatment.   I discussed the assessment and treatment plan with the patient. The patient was provided an opportunity to ask questions and all were answered. The patient agreed with the plan and demonstrated an understanding of the instructions.   The patient was advised to call back or seek an in-person evaluation if the symptoms worsen or if the condition fails to improve as anticipated.  Time spent 20-25 minutes Delorise Jackson, MD

## 2019-11-30 ENCOUNTER — Encounter: Payer: Self-pay | Admitting: Internal Medicine

## 2019-11-30 MED ORDER — ALBUTEROL SULFATE HFA 108 (90 BASE) MCG/ACT IN AERS: 1.0000 | INHALATION_SPRAY | RESPIRATORY_TRACT | 11 refills | Status: DC | PRN

## 2019-11-30 MED ORDER — MONTELUKAST SODIUM 10 MG PO TABS: ORAL_TABLET | ORAL | 3 refills | Status: DC

## 2019-11-30 MED ORDER — LEVOTHYROXINE SODIUM 25 MCG PO TABS: 25.0000 ug | ORAL_TABLET | Freq: Every day | ORAL | 3 refills | Status: DC

## 2019-11-30 NOTE — Patient Instructions (Addendum)
Consider shingrix vaccine x 2 doses 2nd dose 2 to <6 months from the 1st  -let me know if you want me to send this to your pharmacy   Consider repeat pneumonia 23 vaccine in our office   Rec. Vitamin D3 4000 IU daily  Try over the counter alpha lipoic acid 600 mg 2x per day for neuropathy pain in legs  Follow up with Dr. Mercy Moore reached out to him to work up up for this and consider the scrambler machine as treatment after work up   Please follow up with [facility] about your stomach issues Dr. Alice Reichert referral placed and consider Align probiotics or Renew both over the counter    Irritable Bowel Syndrome, Adult  Irritable bowel syndrome (IBS) is a group of symptoms that affects the organs responsible for digestion (gastrointestinal or GI tract). IBS is not one specific disease. To regulate how the GI tract works, the body sends signals back and forth between the intestines and the brain. If you have IBS, there may be a problem with these signals. As a result, the GI tract does not function normally. The intestines may become more sensitive and overreact to certain things. This may be especially true when you eat certain foods or when you are under stress. There are four types of IBS. These may be determined based on the consistency of your stool (feces):  IBS with diarrhea.  IBS with constipation.  Mixed IBS.  Unsubtyped IBS. It is important to know which type of IBS you have. Certain treatments are more likely to be helpful for certain types of IBS. What are the causes? The exact cause of IBS is not known. What increases the risk? You may have a higher risk for IBS if you:  Are female.  Are younger than 31.  Have a family history of IBS.  Have a mental health condition, such as depression, anxiety, or post-traumatic stress disorder.  Have had a bacterial infection of your GI tract. What are the signs or symptoms? Symptoms of IBS vary from person to person. The main  symptom is abdominal pain or discomfort. Other symptoms usually include one or more of the following:  Diarrhea, constipation, or both.  Abdominal swelling or bloating.  Feeling full after eating a small or regular-sized meal.  Frequent gas.  Mucus in the stool.  A feeling of having more stool left after a bowel movement. Symptoms tend to come and go. They may be triggered by stress, mental health conditions, or certain foods. How is this diagnosed? This condition may be diagnosed based on a physical exam, your medical history, and your symptoms. You may have tests, such as:  Blood tests.  Stool test.  X-rays.  CT scan.  Colonoscopy. This is a procedure in which your GI tract is viewed with a long, thin, flexible tube. How is this treated? There is no cure for IBS, but treatment can help relieve symptoms. Treatment depends on the type of IBS you have, and may include:  Changes to your diet, such as: ? Avoiding foods that cause symptoms. ? Drinking more water. ? Following a low-FODMAP (fermentable oligosaccharides, disaccharides, monosaccharides, and polyols) diet for up to 6 weeks, or as told by your health care provider. FODMAPs are sugars that are hard for some people to digest. ? Eating more fiber. ? Eating medium-sized meals at the same times every day.  Medicines. These may include: ? Fiber supplements, if you have constipation. ? Medicine to control  diarrhea (antidiarrheal medicines). ? Medicine to help control muscle tightening (spasms) in your GI tract (antispasmodic medicines). ? Medicines to help with mental health conditions, such as antidepressants or tranquilizers.  Talk therapy or counseling.  Working with a diet and nutrition specialist (dietitian) to help create a food plan that is right for you.  Managing your stress. Follow these instructions at home: Eating and drinking  Eat a healthy diet.  Eat medium-sized meals at about the same time every  day. Do not eat large meals.  Gradually eat more fiber-rich foods. These include whole grains, fruits, and vegetables. This may be especially helpful if you have IBS with constipation.  Eat a diet low in FODMAPs.  Drink enough fluid to keep your urine pale yellow.  Keep a journal of foods that seem to trigger symptoms.  Avoid foods and drinks that: ? Contain added sugar. ? Make your symptoms worse. Dairy products, caffeinated drinks, and carbonated drinks can make symptoms worse for some people. General instructions  Take over-the-counter and prescription medicines and supplements only as told by your health care provider.  Get enough exercise. Do at least 150 minutes of moderate-intensity exercise each week.  Manage your stress. Getting enough sleep and exercise can help you manage stress.  Keep all follow-up visits as told by your health care provider and therapist. This is important. Alcohol Use  Do not drink alcohol if: ? Your health care provider tells you not to drink. ? You are pregnant, may be pregnant, or are planning to become pregnant.  If you drink alcohol, limit how much you have: ? 0-1 drink a day for women. ? 0-2 drinks a day for men.  Be aware of how much alcohol is in your drink. In the [place] one drink equals one typical bottle of beer (12 oz), one-half glass of wine (5 oz), or one shot of hard liquor (1 oz). Contact a health care provider if you have:  Constant pain.  Weight loss.  Difficulty or pain when swallowing.  Diarrhea that gets worse. Get help right away if you have:  Severe abdominal pain.  Fever.  Diarrhea with symptoms of dehydration, such as dizziness or dry mouth.  Bright red blood in your stool.  Stool that is black and tarry.  Abdominal swelling.  Vomiting that does not stop.  Blood in your vomit. Summary  Irritable bowel syndrome (IBS) is not one specific disease. It is a group of symptoms that affects digestion.  Your  intestines may become more sensitive and overreact to certain things. This may be especially true when you eat certain foods or when you are under stress.  There is no cure for IBS, but treatment can help relieve symptoms. This information is not intended to replace advice given to you by your health care provider. Make sure you discuss any questions you have with your health care provider. Document Revised: 11/09/2017 Document Reviewed: 11/09/2017 Elsevier Patient Education  2020 [place].  Diet for Irritable Bowel Syndrome When you have irritable bowel syndrome (IBS), it is very important to eat the foods and follow the eating habits that are best for your condition. IBS may cause various symptoms such as pain in the abdomen, constipation, or diarrhea. Choosing the right foods can help to ease the discomfort from these symptoms. Work with your health care provider and diet and nutrition specialist (dietitian) to find the eating plan that will help to control your symptoms. What are tips for following this plan?  Keep a food diary. This will help you identify foods that cause symptoms. Write down: ? What you eat and when you eat it. ? What symptoms you have. ? When symptoms occur in relation to your meals, such as "pain in abdomen 2 hours after dinner."  Eat your meals slowly and in a relaxed setting.  Aim to eat 5-6 small meals per day. Do not skip meals.  Drink enough fluid to keep your urine pale yellow.  Ask your health care provider if you should take an over-the-counter probiotic to help restore healthy bacteria in your gut (digestive tract). ? Probiotics are foods that contain good bacteria and yeasts.  Your dietitian may have specific dietary recommendations for you based on your symptoms. He or she may recommend that you: ? Avoid foods that cause symptoms. Talk with your dietitian about other ways to get the same nutrients that are in those problem foods. ? Avoid  foods with gluten. Gluten is a protein that is found in rye, wheat, and barley. ? Eat more foods that contain soluble fiber. Examples of foods with high soluble fiber include oats, seeds, and certain fruits and vegetables. Take a fiber supplement if directed by your dietitian. ? Reduce or avoid certain foods called FODMAPs. These are foods that contain carbohydrates that are hard to digest. Ask your doctor which foods contain these carbohydrates. What foods are not recommended? The following are some foods and drinks that may make your symptoms worse:  Fatty foods, such as french fries.  Foods that contain gluten, such as pasta and cereal.  Dairy products, such as milk, cheese, and ice cream.  Chocolate.  Alcohol.  Products with caffeine, such as coffee.  Carbonated drinks, such as soda.  Foods that are high in FODMAPs. These include certain fruits and vegetables.  Products with sweeteners such as honey, high fructose corn syrup, sorbitol, and mannitol. The items listed above may not be a complete list of foods and beverages you should avoid. Contact a dietitian for more information. What foods are good sources of fiber? Your health care provider or dietitian may recommend that you eat more foods that contain fiber. Fiber can help to reduce constipation and other IBS symptoms. Add foods with fiber to your diet a little at a time so your body can get used to them. Too much fiber at one time might cause gas and swelling of your abdomen. The following are some foods that are good sources of fiber:  Berries, such as raspberries, strawberries, and blueberries.  Tomatoes.  Carrots.  Brown rice.  Oats.  Seeds, such as chia and pumpkin seeds. The items listed above may not be a complete list of recommended sources of fiber. Contact your dietitian for more options. Where to find more information  International Foundation for Functional Gastrointestinal Disorders:  www.iffgd.[employer] and Digestive and Kidney Diseases: [employer] Summary  When you have irritable bowel syndrome (IBS), it is very important to eat the foods and follow the eating habits that are best for your condition.  IBS may cause various symptoms such as pain in the abdomen, constipation, or diarrhea.  Choosing the right foods can help to ease the discomfort that comes from symptoms.  Keep a food diary. This will help you identify foods that cause symptoms.  Your health care provider or diet and nutrition specialist (dietitian) may recommend that you eat more foods that contain fiber. This information is not intended to replace advice given to you  by your health care provider. Make sure you discuss any questions you have with your health care provider. Document Revised: 03/08/2019 Document Reviewed: 07/20/2017 Elsevier Patient Education  [place]  FODMAPs (fermentable oligosaccharides, disaccharides, monosaccharides, and polyols) are sugars that are hard for some people to digest. A low-FODMAP eating plan may help some people who have bowel (intestinal) diseases to manage their symptoms. This meal plan can be complicated to follow. Work with a diet and nutrition specialist (dietitian) to make a low-FODMAP eating plan that is right for you. A dietitian can make sure that you get enough nutrition from this diet. What are tips for following this plan? Reading food labels  Check labels for hidden FODMAPs such as: ? High-fructose syrup. ? Honey. ? Agave. ? Natural fruit flavors. ? Onion or garlic powder.  Choose low-FODMAP foods that contain 3-4 grams of fiber per serving.  Check food labels for serving sizes. Eat only one serving at a time to make sure FODMAP levels stay low. Meal planning  Follow a low-FODMAP eating plan for up to 6 weeks, or as told by your health care provider or dietitian.  To follow the  eating plan: 1. Eliminate high-FODMAP foods from your diet completely. 2. Gradually reintroduce high-FODMAP foods into your diet one at a time. Most people should wait a few days after introducing one high-FODMAP food before they introduce the next high-FODMAP food. Your dietitian can recommend how quickly you may reintroduce foods. 3. Keep a daily record of what you eat and drink, and make note of any symptoms that you have after eating. 4. Review your daily record with a dietitian regularly. Your dietitian can help you identify which foods you can eat and which foods you should avoid. General tips  Drink enough fluid each day to keep your urine pale yellow.  Avoid processed foods. These often have added sugar and may be high in FODMAPs.  Avoid most dairy products, whole grains, and sweeteners.  Work with a dietitian to make sure you get enough fiber in your diet. Recommended foods Grains  Gluten-free grains, such as rice, oats, buckwheat, quinoa, corn, polenta, and millet. Gluten-free pasta, bread, or cereal. Rice noodles. Corn tortillas. Vegetables  Eggplant, zucchini, cucumber, peppers, green beans, Brussels sprouts, bean sprouts, lettuce, arugula, kale, Swiss chard, spinach, collard greens, bok choy, summer squash, potato, and tomato. Limited amounts of corn, carrot, and sweet potato. Green parts of scallions. Fruits  Bananas, oranges, lemons, limes, blueberries, raspberries, strawberries, grapes, cantaloupe, honeydew melon, kiwi, papaya, passion fruit, and pineapple. Limited amounts of dried cranberries, banana chips, and shredded coconut. Dairy  Lactose-free milk, yogurt, and kefir. Lactose-free cottage cheese and ice cream. Non-dairy milks, such as almond, coconut, hemp, and rice milk. Yogurts made of non-dairy milks. Limited amounts of goat cheese, brie, mozzarella, parmesan, swiss, and other hard cheeses. Meats and other protein foods  Unseasoned beef, pork, poultry, or fish.  Eggs. Berniece Salines. Tofu (firm) and tempeh. Limited amounts of nuts and seeds, such as almonds, walnuts, [place] nuts, pecans, peanuts, pumpkin seeds, chia seeds, and sunflower seeds. Fats and oils  Butter-free spreads. Vegetable oils, such as olive, canola, and sunflower oil. Seasoning and other foods  Artificial sweeteners with names that do not end in "ol" such as aspartame, saccharine, and stevia. Maple syrup, white table sugar, raw sugar, brown sugar, and molasses. Fresh basil, coriander, parsley, rosemary, and thyme. Beverages  Water and mineral water. Sugar-sweetened soft drinks. Small amounts of orange juice or  cranberry juice. Black and green tea. Most dry wines. Coffee. This may not be a complete list of low-FODMAP foods. Talk with your dietitian for more information. Foods to avoid Grains  Wheat, including kamut, durum, and semolina. Barley and bulgur. Couscous. Wheat-based cereals. Wheat noodles, bread, crackers, and pastries. Vegetables  Chicory root, artichoke, asparagus, cabbage, snow peas, sugar snap peas, mushrooms, and cauliflower. Onions, garlic, leeks, and the white part of scallions. Fruits  Fresh, dried, and juiced forms of apple, pear, watermelon, peach, plum, cherries, apricots, blackberries, boysenberries, figs, nectarines, and mango. Avocado. Dairy  Milk, yogurt, ice cream, and soft cheese. Cream and sour cream. Milk-based sauces. Custard. Meats and other protein foods  Fried or fatty meat. Sausage. Cashews and pistachios. Soybeans, baked beans, black beans, chickpeas, kidney beans, fava beans, navy beans, lentils, and split peas. Seasoning and other foods  Any sugar-free gum or candy. Foods that contain artificial sweeteners such as sorbitol, mannitol, isomalt, or xylitol. Foods that contain honey, high-fructose corn syrup, or agave. Bouillon, vegetable stock, beef stock, and chicken stock. Garlic and onion powder. Condiments made with onion, such as hummus, chutney,  pickles, relish, salad dressing, and salsa. Tomato paste. Beverages  Chicory-based drinks. Coffee substitutes. Chamomile tea. Fennel tea. Sweet or fortified wines such as port or sherry. Diet soft drinks made with isomalt, mannitol, maltitol, sorbitol, or xylitol. Apple, pear, and mango juice. Juices with high-fructose corn syrup. This may not be a complete list of high-FODMAP foods. Talk with your dietitian to discuss what dietary choices are best for you.  Summary  A low-FODMAP eating plan is a short-term diet that eliminates FODMAPs from your diet to help ease symptoms of certain bowel diseases.  The eating plan usually lasts up to 6 weeks. After that, high-FODMAP foods are restarted gradually, one at a time, so you can find out which may be causing symptoms.  A low-FODMAP eating plan can be complicated. It is best to work with a dietitian who has experience with this type of plan. This information is not intended to replace advice given to you by your health care provider. Make sure you discuss any questions you have with your health care provider. Document Revised: 10/29/2017 Document Reviewed: 07/13/2017 Elsevier Patient Education  [place].  Neuropathic Pain Neuropathic pain is pain caused by damage to the nerves that are responsible for certain sensations in your body (sensory nerves). The pain can be caused by:  Damage to the sensory nerves that send signals to your spinal cord and brain (peripheral nervous system).  Damage to the sensory nerves in your brain or spinal cord (central nervous system). Neuropathic pain can make you more sensitive to pain. Even a minor sensation can feel very painful. This is usually a long-term condition that can be difficult to treat. The type of pain differs from person to person. It may:  Start suddenly (acute), or it may develop slowly and last for a long time (chronic).  Come and go as damaged nerves heal, or it may stay at the same  level for years.  Cause emotional distress, loss of sleep, and a lower quality of life. What are the causes? The most common cause of this condition is diabetes. Many other diseases and conditions can also cause neuropathic pain. Causes of neuropathic pain can be classified as:  Toxic. This is caused by medicines and chemicals. The most common cause of toxic neuropathic pain is damage from cancer treatments (chemotherapy).  Metabolic. This can be caused by: ?  Diabetes. This is the most common disease that damages the nerves. ? Lack of vitamin B from long-term alcohol abuse.  Traumatic. Any injury that cuts, crushes, or stretches a nerve can cause damage and pain. A common example is feeling pain after losing an arm or leg (phantom limb pain).  Compression-related. If a sensory nerve gets trapped or compressed for a long period of time, the blood supply to the nerve can be cut off.  Vascular. Many blood vessel diseases can cause neuropathic pain by decreasing blood supply and oxygen to nerves.  Autoimmune. This type of pain results from diseases in which the body's defense system (immune system) mistakenly attacks sensory nerves. Examples of autoimmune diseases that can cause neuropathic pain include lupus and multiple sclerosis.  Infectious. Many types of viral infections can damage sensory nerves and cause pain. Shingles infection is a common cause of this type of pain.  Inherited. Neuropathic pain can be a symptom of many diseases that are passed down through families (genetic). What increases the risk? You are more likely to develop this condition if:  You have diabetes.  You smoke.  You drink too much alcohol.  You are taking certain medicines, including medicines that kill cancer cells (chemotherapy) or that treat immune system disorders. What are the signs or symptoms? The main symptom is pain. Neuropathic pain is often described  as:  Burning.  Shock-like.  Stinging.  Hot or cold.  Itching. How is this diagnosed? No single test can diagnose neuropathic pain. It is diagnosed based on:  Physical exam and your symptoms. Your health care provider will ask you about your pain. You may be asked to use a pain scale to describe how bad your pain is.  Tests. These may be done to see if you have a high sensitivity to pain and to help find the cause and location of any sensory nerve damage. They include: ? Nerve conduction studies to test how well nerve signals travel through your sensory nerves (electrodiagnostic testing). ? Stimulating your sensory nerves through electrodes on your skin and measuring the response in your spinal cord and brain (somatosensory evoked potential).  Imaging studies, such as: ? X-rays. ? CT scan. ? MRI. How is this treated? Treatment for neuropathic pain may change over time. You may need to try different treatment options or a combination of treatments. Some options include:  Treating the underlying cause of the neuropathy, such as diabetes, kidney disease, or vitamin deficiencies.  Stopping medicines that can cause neuropathy, such as chemotherapy.  Medicine to relieve pain. Medicines may include: ? Prescription or over-the-counter pain medicine. ? Anti-seizure medicine. ? Antidepressant medicines. ? Pain-relieving patches that are applied to painful areas of skin. ? A medicine to numb the area (local anesthetic), which can be injected as a nerve block.  Transcutaneous nerve stimulation. This uses electrical currents to block painful nerve signals. The treatment is painless.  Alternative treatments, such as: ? Acupuncture. ? Meditation. ? Massage. ? Physical therapy. ? Pain management programs. ? Counseling. Follow these instructions at home: Medicines   Take over-the-counter and prescription medicines only as told by your health care provider.  Do not drive or use  heavy machinery while taking prescription pain medicine.  If you are taking prescription pain medicine, take actions to prevent or treat constipation. Your health care provider may recommend that you: ? Drink enough fluid to keep your urine pale yellow. ? Eat foods that are high in fiber, such as fresh fruits and vegetables,  whole grains, and beans. ? Limit foods that are high in fat and processed sugars, such as fried or sweet foods. ? Take an over-the-counter or prescription medicine for constipation. Lifestyle   Have a good support system at home.  Consider joining a chronic pain support group.  Do not use any products that contain nicotine or tobacco, such as cigarettes and e-cigarettes. If you need help quitting, ask your health care provider.  Do not drink alcohol. General instructions  Learn as much as you can about your condition.  Work closely with all your health care providers to find the treatment plan that works best for you.  Ask your health care provider what activities are safe for you.  Keep all follow-up visits as told by your health care provider. This is important. Contact a health care provider if:  Your pain treatments are not working.  You are having side effects from your medicines.  You are struggling with tiredness (fatigue), mood changes, depression, or anxiety. Summary  Neuropathic pain is pain caused by damage to the nerves that are responsible for certain sensations in your body (sensory nerves).  Neuropathic pain may come and go as damaged nerves heal, or it may stay at the same level for years.  Neuropathic pain is usually a long-term condition that can be difficult to treat. Consider joining a chronic pain support group. This information is not intended to replace advice given to you by your health care provider. Make sure you discuss any questions you have with your health care provider. Document Revised: 03/09/2019 Document Reviewed:  12/03/2017 Elsevier Patient Education  [place].  Zoster Vaccine, Recombinant injection What is this medicine? ZOSTER VACCINE (ZOS ter vak SEEN) is used to prevent shingles in adults 55 years old and over. This vaccine is not used to treat shingles or nerve pain from shingles. This medicine may be used for other purposes; ask your health care provider or pharmacist if you have questions. COMMON BRAND NAME(S): [facility] What should I tell my health care provider before I take this medicine? They need to know if you have any of these conditions:  blood disorders or disease  cancer like leukemia or lymphoma  immune system problems or therapy  an unusual or allergic reaction to vaccines, other medications, foods, dyes, or preservatives  pregnant or trying to get pregnant  breast-feeding How should I use this medicine? This vaccine is for injection in a muscle. It is given by a health care professional. Talk to your pediatrician regarding the use of this medicine in children. This medicine is not approved for use in children. Overdosage: If you think you have taken too much of this medicine contact a poison control center or emergency room at once. NOTE: This medicine is only for you. Do not share this medicine with others. What if I miss a dose? Keep appointments for follow-up (booster) doses as directed. It is important not to miss your dose. Call your doctor or health care professional if you are unable to keep an appointment. What may interact with this medicine?  medicines that suppress your immune system  medicines to treat cancer  steroid medicines like prednisone or cortisone This list may not describe all possible interactions. Give your health care provider a list of all the medicines, herbs, non-prescription drugs, or dietary supplements you use. Also tell them if you smoke, drink alcohol, or use illegal drugs. Some items may interact with your medicine. What  should I watch for while  using this medicine? Visit your doctor for regular check ups. This vaccine, like all vaccines, may not fully protect everyone. What side effects may I notice from receiving this medicine? Side effects that you should report to your doctor or health care professional as soon as possible:  allergic reactions like skin rash, itching or hives, swelling of the face, lips, or tongue  breathing problems Side effects that usually do not require medical attention (report these to your doctor or health care professional if they continue or are bothersome):  chills  headache  fever  nausea, vomiting  redness, warmth, pain, swelling or itching at site where injected  tiredness This list may not describe all possible side effects. Call your doctor for medical advice about side effects. You may report side effects to FDA at 1-800-FDA-1088. Where should I keep my medicine? This vaccine is only given in a clinic, pharmacy, doctor's office, or other health care setting and will not be stored at home. NOTE: This sheet is a summary. It may not cover all possible information. If you have questions about this medicine, talk to your doctor, pharmacist, or health care provider.  2020 Elsevier/Gold Standard (2017-06-28 13:20:30)

## 2019-12-28 ENCOUNTER — Encounter (INDEPENDENT_AMBULATORY_CARE_PROVIDER_SITE_OTHER): Payer: Self-pay | Admitting: Nurse Practitioner

## 2019-12-28 ENCOUNTER — Other Ambulatory Visit: Payer: Self-pay

## 2019-12-28 ENCOUNTER — Ambulatory Visit (INDEPENDENT_AMBULATORY_CARE_PROVIDER_SITE_OTHER): Payer: Medicare Other | Admitting: Nurse Practitioner

## 2019-12-28 VITALS — BP 132/84 | HR 84 | Resp 16 | Wt 213.0 lb

## 2019-12-28 DIAGNOSIS — I890 Lymphedema, not elsewhere classified: Secondary | ICD-10-CM

## 2019-12-28 DIAGNOSIS — I872 Venous insufficiency (chronic) (peripheral): Secondary | ICD-10-CM | POA: Diagnosis not present

## 2019-12-28 MED ORDER — TRIAMCINOLONE ACETONIDE 0.5 % EX OINT: 1.0000 "application " | TOPICAL_OINTMENT | Freq: Every day | CUTANEOUS | 0 refills | Status: DC

## 2020-01-01 ENCOUNTER — Encounter (INDEPENDENT_AMBULATORY_CARE_PROVIDER_SITE_OTHER): Payer: Self-pay | Admitting: Nurse Practitioner

## 2020-01-01 NOTE — Progress Notes (Signed)
SUBJECTIVE:  [name], female    DOB: Apr 23, 1966, 54 y.o.   MRN: [id number] Chief Complaint  Patient presents with  . Follow-up    58month follow up    HPI  [name] [name] is a 54 y.o. female  The patient returns to the office for followup evaluation regarding leg swelling.  The swelling has persisted but with the lymph pump the patient states the swelling is much better controlled. The pain associated with swelling is essentially eliminated. There have not been any interval development of a ulcerations or wounds.  No episodes of cellulitis or infection over the past 12 months  The patient denies problems with the pump, noting it is working well and the leggings are in good condition.  Since the previous visit the patient has been wearing graduated compression stockings and using the lymph pump on a routine basis and  has noted significant improvement in the lymphedema.   Patient stated the lymph pump has been a very positive factor in her care.        Past Medical History:  Diagnosis Date  . ADHD   . Anemia   . ANXIETY 06/06/2007  . Arthritis    related to lupus  . Asthma   . BACK PAIN 02/22/2009  . Cervical cancer ([place])    LEEP 2005   . Chicken pox   . Chronic pain    goes to pain management provider  . Collagen vascular disease ([place])   . Connective tissue disease ([place])   . DEPRESSION 06/06/2007  . Essential tremor   . Fibromyalgia   . Fibromyalgia   . FREQUENCY, URINARY 07/07/2007  . Gastritis   . GERD 06/06/2007  . Glaucoma   . Headache    h/o migraines   . History of hiatal hernia   . History of kidney stones   . HYPOTHYROIDISM 06/06/2007  . INTERSTITIAL CYSTITIS 09/11/2010  . Lupus ([place]) 2006  . Lupus ([place])   . Menopause   . Migraine   . Mitral valve regurgitation   . NEPHROLITHIASIS, HX OF 11/05/2010  . Neuropathy   . Neuropathy   . OCD (obsessive compulsive disorder)   . Optic neuritis    2005  . OSTEOPENIA 10/14/2009  .  Osteoporosis   . OVERACTIVE BLADDER 10/14/2009  . PAIN, CHRONIC NEC 06/06/2007  . Panniculitis   . Parkinson disease ([place]) 04/03/2019  . POLYARTHRITIS 08/19/2007  . Raynaud's disease   . Sicca syndrome ([place])   . 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 Vaginal prolapse    Dr. Marcelline Mates [facility]   . Vasculitis ([place])   . WEIGHT GAIN 02/22/2009    Past Surgical History:  Procedure Laterality Date  . APPENDECTOMY    . CERVICAL BIOPSY  W/ LOOP ELECTRODE EXCISION  2005  . CHOLECYSTECTOMY    . ESOPHAGOGASTRODUODENOSCOPY (EGD) WITH PROPOFOL N/A 08/27/2017   Procedure: ESOPHAGOGASTRODUODENOSCOPY (EGD) WITH PROPOFOL;  Surgeon: Manya Silvas, MD;  Location: [facility] ENDOSCOPY;  Service: Endoscopy;  Laterality: N/A;  . HAND SURGERY     repair of left metatarsal   . HARDWARE REMOVAL Right 02/16/2017   Procedure: HARDWARE REMOVAL FROM HIP;  Surgeon: Hessie Knows, MD;  Location: [facility] ORS;  Service: Orthopedics;  Laterality: Right;  . JOINT REPLACEMENT    . OTHER SURGICAL HISTORY     left 3rd metatarsal fracture repair 2011   . REVISION TOTAL HIP ARTHROPLASTY  06/2009   replacement 2010 then screw and plate removal [id number]; right hip  .  TONSILLECTOMY    . TONSILLECTOMY    . wrist  ligament repair bilateral    . WRIST RECONSTRUCTION    . WRIST SURGERY     laceration of right wrist ligaments  . wrist surgery     laceration of left wrist surgery     Social History   Socioeconomic History  . Marital status: Single    Spouse name: Not on file  . Number of children: 1  . Years of education: Not on file  . Highest education level: Not on file  Occupational History  . Occupation: DISABLED    Employer: UNEMPLOYED  Tobacco Use  . Smoking status: Never Smoker  . Smokeless tobacco: Never Used  Substance and Sexual Activity  . Alcohol use: No  . Drug use: No  . Sexual activity: Not Currently    Birth control/protection: None, Post-menopausal  Other Topics Concern  . 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 Difficulty of Paying Living Expenses: Not hard at all  Food Insecurity: No Food Insecurity  . Worried About Charity fundraiser in the Last Year: Never true  . Ran Out of Food in the Last Year: Never true  Transportation Needs: No Transportation Needs  . Lack of Transportation (Medical): No  . Lack of Transportation (Non-Medical): No  Physical Activity: Insufficiently Active  . Days of Exercise per Week: 7 days  . Minutes of Exercise per Session: 20 min  Stress: No Stress Concern Present  . Feeling of Stress : Only a little  Social Connections:   . Frequency of Communication with Friends and Family: Not on file  . Frequency of Social Gatherings with Friends and Family: Not on file  . Attends Religious Services: Not on file  . Active Member of Clubs or Organizations: Not on file  . Attends Archivist Meetings: Not on file  . Marital Status: Not on file  Intimate Partner Violence:   . Fear of Current or Ex-Partner: Not on file  . Emotionally Abused: Not on file  . Physically Abused: Not on file  . Sexually Abused: Not on file    Family History  Problem Relation Age of Onset  . Hypertension Mother   . Arthritis Mother   . Heart disease Mother        afib  . Asthma Sister   . Asthma Daughter   . Arthritis Daughter   . Heart disease Daughter        ?heart condition on BB  . ADD / ADHD Daughter   . Pancreatic cancer Father   . Cancer Father        pancreatitic   . Diabetes Maternal Grandmother   . Heart disease Maternal Grandmother   . Arthritis Maternal Grandmother   . Hypertension Maternal Grandmother   . Colon cancer Maternal Uncle 45  . Crohn's disease Maternal Aunt   . Diabetes Maternal Aunt   . Breast cancer Cousin 49       maternal  .  Cancer Cousin        m cousin breat cancer s/p removal both breasts     Allergies  Allergen Reactions  . Bee Venom Itching and Swelling    Affected area Affected area Affected area  Affected area   . Fish Allergy Hives, Swelling and Rash    Facial swelling  Facial swelling  Facial swelling Facial swelling  Facial swelling   . Latex Anaphylaxis, Rash and Shortness Of Breath    Rash  Rash    . Prasterone Other (See Comments) and Nausea And Vomiting    rash Other reaction(s): Headache Headaches.  . Shellfish Allergy Hives, Other (See Comments), Rash and Swelling    Facial swelling Uncoded Allergy. Allergen: seafood Uncoded Allergy. Allergen: CATS, Other Reaction: itch, wheezing Uncoded Allergy. Allergen: COMPAZINE, Other Reaction: tremors Facial swelling Facial swelling Uncoded Allergy. Allergen: seafood Uncoded Allergy. Allergen: CATS, Other Reaction: itch, wheezing Uncoded Allergy. Allergen: COMPAZINE, Other Reaction: tremors Facial swelling Uncoded Allergy. Allergen: seafood Uncoded Allergy. Allergen: CATS, Other Reaction: itch, wheezing Uncoded Allergy. Allergen: COMPAZINE, Other Reaction: tremors Facial swelling Facial swelling Uncoded Allergy. Allergen: seafood Uncoded Allergy. Allergen: CATS, Other Reaction: itch, wheezing Uncoded Allergy. Allergen: COMPAZINE, Other Reaction: tremors Facial swelling Facial swelling Uncoded Allergy. Allergen: seafood Uncoded Allergy. Allergen: CATS, Other Reaction: itch, wheezing Uncoded Allergy. Allergen: COMPAZINE, Other Reaction: tremors   . Shellfish-Derived Products Hives, Other (See Comments), Rash and Swelling    Facial swelling Uncoded Allergy. Allergen: seafood Uncoded Allergy. Allergen: CATS, Other Reaction: itch, wheezing Uncoded Allergy. Allergen: COMPAZINE, Other Reaction: tremors Facial swelling Facial swelling Uncoded Allergy. Allergen: seafood Uncoded Allergy. Allergen: CATS, Other Reaction: itch,  wheezing Uncoded Allergy. Allergen: COMPAZINE, Other Reaction: tremors Facial swelling Uncoded Allergy. Allergen: seafood Uncoded Allergy. Allergen: CATS, Other Reaction: itch, wheezing Uncoded Allergy. Allergen: COMPAZINE, Other Reaction: tremors Facial swelling  . Compazine [Prochlorperazine Edisylate] Other (See Comments)    tremors  . Dhea [Nutritional Supplements] Other (See Comments)    Headaches.  . Dilaudid [Hydromorphone Hcl]     ? reaction  . Lactose   . Lactose Intolerance ([place])   . Other Other (See Comments)    Other reaction(s): Unknown   . Prochlorperazine     Other reaction(s): Other (See Comments) ticks  . Promethazine     Other reaction(s): Other (See Comments) Ticks  . Promethazine Hcl Other (See Comments)    CNS disorder  . Reclast [Zoledronic Acid]     Weakness could not move limbs, fatigue, increase sleep  . Clarithromycin Hives and Rash  . Clotrimazole Swelling and Rash  . Hydromorphone Hcl Rash and Hives    "Rash all over"  . Penicillins Rash and Other (See Comments)    Has patient had a PCN reaction causing immediate rash, facial/tongue/throat swelling, SOB or lightheadedness with hypotension:No Has patient had a PCN reaction causing severe rash involving mucus membranes or skin necrosis:No Has patient had a PCN reaction that required hospitalization:No Has patient had a PCN reaction occurring within the last 10 years:No If all of the above answers are "NO", then may proceed with Cephalosporin use.     Review of Systems   Review of Systems: Negative Unless Checked Constitutional: [] Weight loss  [] Fever  [] Chills Cardiac: [] Chest pain   []  Atrial Fibrillation  [] Palpitations   [] Shortness of breath when laying flat   [] Shortness of breath with exertion. [] Shortness of breath at rest Vascular:  [] Pain in legs with walking   [] Pain in legs with standing [] Pain in legs when laying flat   [] Claudication    [] Pain in feet when laying flat    [] History of  DVT   [] Phlebitis   [x] Swelling in legs   [] Varicose veins   [] Non-healing ulcers Pulmonary:   [] Uses home oxygen   []   Productive cough   [] Hemoptysis   [] Wheeze  [] COPD   [x] Asthma Neurologic:  [] Dizziness   [] Seizures  [] Blackouts [] History of stroke   [] History of TIA  [] Aphasia   [] Temporary Blindness   [] Weakness or numbness in arm   [] Weakness or numbness in leg  xtremors  musculoskeletal:   [] Joint swelling   [] Joint pain   [] Low back pain  []  History of Knee Replacement [x] Arthritis [] back Surgeries  []  Spinal Stenosis    Hematologic:  [] Easy bruising  [] Easy bleeding   [] Hypercoagulable state   [x] Anemic Gastrointestinal:  [] Diarrhea   [] Vomiting  [] Gastroesophageal reflux/heartburn   [] Difficulty swallowing. [] Abdominal pain Genitourinary:  [] Chronic kidney disease   [] Difficult urination  [] Anuric   [] Blood in urine [] Frequent urination  [] Burning with urination   [] Hematuria Skin:  [] Rashes   [] Ulcers [] Wounds Psychological:  [] History of anxiety   []  History of major depression  []  Memory Difficulties      OBJECTIVE:   Physical Exam  BP 132/84 (BP Location: Right Arm)   Pulse 84   Resp 16   Wt 213 lb (96.6 kg)   LMP 01/02/2016 (Exact Date)   BMI 32.39 kg/m   Gen: WD/WN, NAD Head: [place]/AT, No temporalis wasting.  Ear/Nose/Throat: Hearing grossly intact, nares w/o erythema or drainage Eyes: PER, EOMI, sclera nonicteric.  Neck: Supple, no masses.  No JVD.  Pulmonary:  Good air movement, no use of accessory muscles.  Cardiac: RRR Vascular:  Minimal edema bilaterally Vessel Right Left  Posterior Tibial Palpable Palpable   Gastrointestinal: soft, non-distended. No guarding/no peritoneal signs.  Musculoskeletal: M/S 5/5 throughout.  No deformity or atrophy.  Neurologic: Pain and light touch intact in extremities.  Symmetrical.  Speech is fluent. Motor exam as listed above. Psychiatric: Judgment intact, Mood & affect appropriate for pt's clinical situation. Dermatologic:   Moderate stasis dermatitis bilaterally.  No Ulcers Noted.  No changes consistent with cellulitis. Lymph : No Cervical lymphadenopathy, no lichenification or skin changes of chronic lymphedema.       ASSESSMENT AND PLAN:  1. Lymphedema  No surgery or intervention at this point in time.    I have reviewed my discussion with the patient regarding lymphedema and why it  causes symptoms.  Patient will continue wearing graduated compression stockings class 1 (20-30 mmHg) on a daily basis a prescription was given. The patient is reminded to put the stockings on first thing in the morning and removing them in the evening. The patient is instructed specifically not to sleep in the stockings.   In addition, behavioral modification throughout the day will be continued.  This will include frequent elevation (such as in a recliner), use of over the counter pain medications as needed and exercise such as walking.  I have reviewed systemic causes for chronic edema such as liver, kidney and cardiac etiologies and there does not appear to be any significant changes in these organ systems over the past year.  The patient is under the impression that these organ systems are all stable and unchanged.    The patient will continue aggressive use of the  lymph pump.  This will continue to improve the edema control and prevent sequela such as ulcers and infections.   The patient will follow-up with me on an annual basis.    2. Chronic venous insufficiency Patient still has some persistent stasis dermatitis on her bilateral lower extremities.  We will try a small treatment with some triamcinolone to see if that helps to lighten  the area some. - triamcinolone ointment (KENALOG) 0.5 %; Apply 1 application topically daily.  Dispense: 30 g; Refill: 0   Current Outpatient Medications on File Prior to Visit  Medication Sig Dispense Refill  . acetaminophen (ACETAMINOPHEN 8 HOUR) 650 MG CR tablet Take 650 mg by mouth  every 8 (eight) hours as needed for pain.    Marland Kitchen albuterol (VENTOLIN HFA) 108 (90 Base) MCG/ACT inhaler Inhale 1-2 puffs into the lungs every 4 (four) hours as needed for wheezing or shortness of breath. 18 g 11  . ARIPiprazole (ABILIFY) 20 MG tablet Take 20 mg by mouth daily.    . Ascorbic Acid (VITAMIN C) 500 MG CHEW Chew 2,500 mg by mouth daily.    Marland Kitchen aspirin 325 MG tablet Take 1 tablet (325 mg total) by mouth daily. 30 tablet 0  . Biotin 10 MG CAPS Take by mouth.    . Biotin 5000 MCG SUBL Place 10,000 mcg under the tongue daily.    . calcipotriene (DOVONOX) 0.005 % ointment calcipotriene 0.005 % topical ointment  APPLY A THIN LAYER TO THE AFFECTED AREA(S) BY TOPICAL ROUTE ONCE DAILY ; RUB IN GENTLY AND COMPLETELY    . carbidopa-levodopa (SINEMET IR) 10-100 MG tablet Take 0.5 tablets by mouth 3 (three) times daily.    . Cholecalciferol (VITAMIN D3) 2000 units capsule Vitamin D3 2,000 unit capsule    . dapsone 25 MG tablet Take by mouth daily.     Marland Kitchen dexmethylphenidate (FOCALIN XR) 20 MG 24 hr capsule dexmethylphenidate ER 20 mg capsule,extended release biphasic50-50    . diclofenac sodium (VOLTAREN) 1 % GEL Apply 2 g topically 4 (four) times daily as needed (for knee & finger pain.).     Marland Kitchen dicyclomine (BENTYL) 10 MG capsule dicyclomine 10 mg capsule  every 8hours as needed    . DULoxetine (CYMBALTA) 60 MG capsule Take 60 mg by mouth 2 (two) times daily.    Water engineer Bandages & Supports (MEDICAL COMPRESSION STOCKINGS) MISC Please provide 52mmHg compression stockings 1 each 0  . fentaNYL (DURAGESIC - DOSED MCG/HR) 75 MCG/HR Place 75 mcg onto the skin every 3 (three) days.    . Ferrous Sulfate (IRON PO) Take by mouth.    . fluocinonide ointment (LIDEX) 0.05 % fluocinonide 0.05 % topical ointment  APPLY TO THE AFFECTED AREA(S) BY TOPICAL ROUTE 2 TIMES PER DAY    . Fluticasone-Salmeterol (ADVAIR DISKUS) 250-50 MCG/DOSE AEPB Inhale 1 puff into the lungs 2 (two) times daily. Rinse mouth 1 each 12  .  folic acid (FOLVITE) 1 MG tablet Take 1 tablet (1 mg total) by mouth daily. 90 tablet 3  . furosemide (LASIX) 20 MG tablet Take 20 mg by mouth daily as needed.     . gabapentin (NEURONTIN) 100 MG capsule Take 100 mg by mouth 3 (three) times daily.     . hydrocortisone (PROCTOZONE-HC) 2.5 % rectal cream Proctozone-HC 2.5 % topical cream perineal applicator    . hydroxychloroquine (PLAQUENIL) 200 MG tablet Plaquenil 200 mg tablet  Take 1 tablet twice a day by oral route after meals for 90 days.    Marland Kitchen levothyroxine (SYNTHROID) 25 MCG tablet Take 1 tablet (25 mcg total) by mouth daily before breakfast. 90 tablet 3  . LORazepam (ATIVAN) 2 MG tablet Take 1-2 mg by mouth See admin instructions. 1 MG DAILY AS NEEDED FOR ANXIETY & SCHEDULED AT BEDTIME EACH NIGHT    . methylphenidate 18 MG PO CR tablet Take by mouth.     Marland Kitchen  montelukast (SINGULAIR) 10 MG tablet montelukast 10 mg tablet 1 pill qhs 90 tablet 3  . Multiple Vitamins-Minerals (MULTI-VITAMIN GUMMIES PO) Multi Vitamin  GUMMIES    . ondansetron (ZOFRAN) 4 MG tablet Zofran 8 mg tablet  Take 1 tablet every 8 hours by oral route as needed.    . pantoprazole (PROTONIX) 40 MG tablet Take one tablet by mouth twice daily (Patient taking differently: TAKE 1 TABLET (40 MG) BY MOUTH ONCE DAILY IN THE MORNING.) 180 tablet 3  . PARoxetine (PAXIL) 10 MG tablet Take 1 tablet (10 mg total) by mouth at bedtime. 90 tablet 3  . polyethylene glycol powder (GLYCOLAX/MIRALAX) powder Take 17 g by mouth daily. 255 g 0  . Polyvinyl Alcohol-Povidone (REFRESH OP) Place 1 drop into both eyes 2 (two) times daily.    . Probiotic Product (PROBIOTIC PO) Probiotic 10 billion cell capsule  daily    . Sodium Chloride 3 % AERS Saline Nasal Mist 3 %  spray nose deeply every hour or two and blow nose    . tiZANidine (ZANAFLEX) 4 MG capsule Take by mouth.    . topiramate (TOPAMAX) 100 MG tablet 100-150 mg. 100 in am and 150 qhs    . valACYclovir (VALTREX) 500 MG tablet valacyclovir  500 mg tablet  1 daily    . ibuprofen (ADVIL,MOTRIN) 800 MG tablet Take 1 tablet (800 mg total) by mouth every 8 (eight) hours as needed. (Patient not taking: Reported on 12/28/2019) 30 tablet 0  . imiquimod (ALDARA) 5 % cream imiquimod 5 % topical cream packet  APPLY TO THE AFFECTED AREA(S) BY TOPICAL ROUTE 3 TIMES PER WEEK at bedtime    . isosorbide mononitrate (IMDUR) 60 MG 24 hr tablet Take 1 tablet (60 mg total) by mouth daily. (Patient not taking: Reported on 12/28/2019) 20 tablet 0  . omeprazole (PRILOSEC) 40 MG capsule Take 40 mg by mouth 2 (two) times daily.    . predniSONE (DELTASONE) 10 MG tablet prednisone 10 mg tablet  1 daily    . silver sulfADIAZINE (SILVADENE) 1 % cream silver sulfadiazine 1 % topical cream    . sucralfate (CARAFATE) 1 g tablet sucralfate 1 gram tablet    . terbinafine (LAMISIL AT) 1 % cream Apply 1 application topically 2 (two) times daily. (Patient not taking: Reported on 12/28/2019) 42 g 2   No current facility-administered medications on file prior to visit.    There are no Patient Instructions on file for this visit. No follow-ups on file.   Kris Hartmann, NP  This note was completed with Sales executive.  Any errors are purely unintentional.

## 2020-01-11 ENCOUNTER — Other Ambulatory Visit: Payer: Self-pay

## 2020-01-16 ENCOUNTER — Other Ambulatory Visit (INDEPENDENT_AMBULATORY_CARE_PROVIDER_SITE_OTHER): Payer: Medicare Other

## 2020-01-16 ENCOUNTER — Other Ambulatory Visit: Payer: Self-pay

## 2020-01-16 DIAGNOSIS — Z13220 Encounter for screening for lipoid disorders: Secondary | ICD-10-CM | POA: Diagnosis not present

## 2020-01-16 DIAGNOSIS — G629 Polyneuropathy, unspecified: Secondary | ICD-10-CM

## 2020-01-16 DIAGNOSIS — E559 Vitamin D deficiency, unspecified: Secondary | ICD-10-CM

## 2020-01-16 DIAGNOSIS — D696 Thrombocytopenia, unspecified: Secondary | ICD-10-CM | POA: Diagnosis not present

## 2020-01-16 DIAGNOSIS — E039 Hypothyroidism, unspecified: Secondary | ICD-10-CM

## 2020-01-16 LAB — COMPREHENSIVE METABOLIC PANEL
ALT: 18 U/L (ref 0–35)
AST: 18 U/L (ref 0–37)
Albumin: 4.3 g/dL (ref 3.5–5.2)
Alkaline Phosphatase: 75 U/L (ref 39–117)
BUN: 17 mg/dL (ref 6–23)
CO2: 27 mEq/L (ref 19–32)
Calcium: 8.9 mg/dL (ref 8.4–10.5)
Chloride: 105 mEq/L (ref 96–112)
Creatinine, Ser: 1.01 mg/dL (ref 0.40–1.20)
GFR: 56.86 mL/min — ABNORMAL LOW (ref >60.00)
Glucose, Bld: 94 mg/dL (ref 70–99)
Potassium: 3.7 mEq/L (ref 3.5–5.1)
Sodium: 139 mEq/L (ref 135–145)
Total Bilirubin: 0.5 mg/dL (ref 0.2–1.2)
Total Protein: 6.7 g/dL (ref 6.0–8.3)

## 2020-01-16 LAB — CBC WITH DIFFERENTIAL/PLATELET
Basophils Absolute: 0 10*3/uL (ref 0.0–0.1)
Basophils Relative: 0.7 % (ref 0.0–3.0)
Eosinophils Absolute: 0.2 10*3/uL (ref 0.0–0.7)
Eosinophils Relative: 4 % (ref 0.0–5.0)
HCT: 41 % (ref 36.0–46.0)
Hemoglobin: 13 g/dL (ref 12.0–15.0)
Lymphocytes Relative: 42.2 % (ref 12.0–46.0)
Lymphs Abs: 1.7 10*3/uL (ref 0.7–4.0)
MCHC: 31.8 g/dL (ref 30.0–36.0)
MCV: 88.3 fl (ref 78.0–100.0)
Monocytes Absolute: 0.3 10*3/uL (ref 0.1–1.0)
Monocytes Relative: 7.9 % (ref 3.0–12.0)
Neutro Abs: 1.8 10*3/uL (ref 1.4–7.7)
Neutrophils Relative %: 45.2 % (ref 43.0–77.0)
Platelets: 144 10*3/uL — ABNORMAL LOW (ref 150.0–400.0)
RBC: 4.64 Mil/uL (ref 3.87–5.11)
RDW: 13.9 % (ref 11.5–15.5)
WBC: 4 10*3/uL (ref 4.0–10.5)

## 2020-01-16 LAB — LIPID PANEL
Cholesterol: 196 mg/dL (ref 0–200)
HDL: 65.9 mg/dL (ref >39.00)
LDL Cholesterol: 111 mg/dL — ABNORMAL HIGH (ref 0–99)
NonHDL: 129.63
Total CHOL/HDL Ratio: 3
Triglycerides: 91 mg/dL (ref 0.0–149.0)
VLDL: 18.2 mg/dL (ref 0.0–40.0)

## 2020-01-16 LAB — TSH: TSH: 8.62 u[IU]/mL — ABNORMAL HIGH (ref 0.35–4.50)

## 2020-01-16 LAB — VITAMIN D 25 HYDROXY (VIT D DEFICIENCY, FRACTURES): VITD: 25.72 ng/mL — ABNORMAL LOW (ref 30.00–100.00)

## 2020-01-19 ENCOUNTER — Encounter: Payer: Self-pay | Admitting: Internal Medicine

## 2020-01-22 ENCOUNTER — Other Ambulatory Visit: Payer: Self-pay | Admitting: Internal Medicine

## 2020-01-22 ENCOUNTER — Other Ambulatory Visit: Payer: Self-pay

## 2020-01-22 ENCOUNTER — Other Ambulatory Visit: Payer: Medicare Other

## 2020-01-22 DIAGNOSIS — R350 Frequency of micturition: Secondary | ICD-10-CM

## 2020-01-22 DIAGNOSIS — N3000 Acute cystitis without hematuria: Secondary | ICD-10-CM

## 2020-01-22 DIAGNOSIS — E039 Hypothyroidism, unspecified: Secondary | ICD-10-CM

## 2020-01-22 MED ORDER — LEVOTHYROXINE SODIUM 50 MCG PO TABS: 50.0000 ug | ORAL_TABLET | Freq: Every day | ORAL | 3 refills | Status: DC

## 2020-01-23 LAB — URINALYSIS, ROUTINE W REFLEX MICROSCOPIC
Bilirubin Urine: NEGATIVE
Glucose, UA: NEGATIVE
Hgb urine dipstick: NEGATIVE
Ketones, ur: NEGATIVE
Leukocytes,Ua: NEGATIVE
Nitrite: NEGATIVE
Protein, ur: NEGATIVE
Specific Gravity, Urine: 1.013 (ref 1.001–1.03)
pH: 5.5 (ref 5.0–8.0)

## 2020-01-23 LAB — URINE CULTURE
MICRO NUMBER:: 10172896
SPECIMEN QUALITY:: ADEQUATE

## 2020-01-25 ENCOUNTER — Encounter: Payer: Self-pay | Admitting: Internal Medicine

## 2020-01-31 ENCOUNTER — Ambulatory Visit: Payer: Medicare Other | Attending: Internal Medicine

## 2020-01-31 DIAGNOSIS — Z20822 Contact with and (suspected) exposure to covid-19: Secondary | ICD-10-CM

## 2020-02-01 LAB — NOVEL CORONAVIRUS, NAA: SARS-CoV-2, NAA: NOT DETECTED

## 2020-03-29 ENCOUNTER — Other Ambulatory Visit: Payer: Self-pay

## 2020-04-02 ENCOUNTER — Encounter: Payer: Self-pay | Admitting: Internal Medicine

## 2020-04-02 ENCOUNTER — Ambulatory Visit (INDEPENDENT_AMBULATORY_CARE_PROVIDER_SITE_OTHER): Payer: Medicare Other | Admitting: Internal Medicine

## 2020-04-02 ENCOUNTER — Other Ambulatory Visit: Payer: Self-pay

## 2020-04-02 VITALS — BP 124/78 | HR 94 | Temp 96.3°F | Ht 68.0 in | Wt 208.8 lb

## 2020-04-02 DIAGNOSIS — R7989 Other specified abnormal findings of blood chemistry: Secondary | ICD-10-CM

## 2020-04-02 DIAGNOSIS — R1084 Generalized abdominal pain: Secondary | ICD-10-CM

## 2020-04-02 DIAGNOSIS — K449 Diaphragmatic hernia without obstruction or gangrene: Secondary | ICD-10-CM

## 2020-04-02 DIAGNOSIS — R109 Unspecified abdominal pain: Secondary | ICD-10-CM

## 2020-04-02 DIAGNOSIS — E039 Hypothyroidism, unspecified: Secondary | ICD-10-CM | POA: Diagnosis not present

## 2020-04-02 DIAGNOSIS — R195 Other fecal abnormalities: Secondary | ICD-10-CM

## 2020-04-02 DIAGNOSIS — R634 Abnormal weight loss: Secondary | ICD-10-CM

## 2020-04-02 DIAGNOSIS — Z1231 Encounter for screening mammogram for malignant neoplasm of breast: Secondary | ICD-10-CM

## 2020-04-02 NOTE — Patient Instructions (Addendum)
For gas and bloating, try Phazyme according to the package directions.   ? If will do gastric emptying study  Vitamin D3 4000 IU daily over the counter daily    04/24/2020 Office Visit Neurology Ray Church, [place]  [facility]  [place], [place] [address]  [phone]  [phone])    [facility] [place], [place], [place] [address]  [phone]  [phone] (Fax)    05/06/2020 Telemedicine-Phone Rheumatology Evette Cristal, MD  [place], [place] [address]  [phone]  [phone] (Fax)    08/19/2020 Office Visit Endocrinology Kathreen Devoid, MD  [place], [place] [address]  [id number]      Criss Rosales Diet A bland diet consists of foods that are often soft and do not have a lot of fat, fiber, or extra seasonings. Foods without fat, fiber, or seasoning are easier for the body to digest. They are also less likely to irritate your mouth, throat, stomach, and other parts of your digestive system. A bland diet is sometimes called a BRAT diet. What is my plan? Your health care provider or food and nutrition specialist (dietitian) may recommend specific changes to your diet to prevent symptoms or to treat your symptoms. These changes may include:  Eating small meals often.  Cooking food until it is soft enough to chew easily.  Chewing your food well.  Drinking fluids slowly.  Not eating foods that are very spicy, sour, or fatty.  Not eating citrus fruits, such as oranges and grapefruit. What do I need to know about this diet?  Eat a variety of foods from the bland diet food list.  Do not follow a bland diet longer than needed.  Ask your health care provider whether you should take vitamins or supplements. What foods can I eat? Grains  Hot cereals, such as cream of wheat. Rice. Bread, crackers, or tortillas made  from refined white flour. Vegetables Canned or cooked vegetables. Mashed or boiled potatoes. Fruits  Bananas. Applesauce. Other types of cooked or canned fruit with the skin and seeds removed, such as canned peaches or pears. Meats and other proteins  Scrambled eggs. Creamy peanut butter or other nut butters. Lean, well-cooked meats, such as chicken or fish. Tofu. Soups or broths. Dairy Low-fat dairy products, such as milk, cottage cheese, or yogurt. Beverages  Water. Herbal tea. Apple juice. Fats and oils Mild salad dressings. Canola or olive oil. Sweets and desserts Pudding. Custard. Fruit gelatin. Ice cream. The items listed above may not be a complete list of recommended foods and beverages. Contact a dietitian for more options. What foods are not recommended? Grains Whole grain breads and cereals. Vegetables Raw vegetables. Fruits Raw fruits, especially citrus, berries, or dried fruits. Dairy Whole fat dairy foods. Beverages Caffeinated drinks. Alcohol. Seasonings and condiments Strongly flavored seasonings or condiments. Hot sauce. Salsa. Other foods Spicy foods. Fried foods. Sour foods, such as pickled or fermented foods. Foods with high sugar content. Foods high in fiber. The items listed above may not be a complete list of foods and beverages to avoid. Contact a dietitian for more information. Summary  A bland diet consists of foods that are often soft and do not have a lot of fat, fiber, or extra seasonings.  Foods without fat, fiber, or seasoning are easier for the body to digest.  Check with your health care provider to  see how long you should follow this diet plan. It is not meant to be followed for long periods. This information is not intended to replace advice given to you by your health care provider. Make sure you discuss any questions you have with your health care provider. Document Revised: 12/15/2017 Document Reviewed: 12/15/2017 Elsevier Patient  Education  2020 [employer].

## 2020-04-02 NOTE — Progress Notes (Signed)
Chief Complaint  Patient presents with  . Follow-up   F/u with mom  1. C/o multiple loose stools per day each time she eats since staring pepcid 40 mg qd and also on omeprazole 40 mg bid instead of protonix 40 mg qd she has lost weight stool is pasty and loose and like sludge.  She has tried peptobismol otc to help but still no help. She c/o abdominal pain middle abdomen, upper abdomen and left lower quadrant and she is on bentyl tid to qid still having abdominal cramps and is unable to tolerate milk products lactaid does not help, now unable to tolerate coconut/almond/soy milk for her cereal. She also has some associated nausea at times but has as needed zofran Agreeable to referral back to GI   2. Hypothyroidism on levo 50 mcg last TSH >8 she feels better with increased dose and did not miss prior doses will repeat today  Review of Systems  Constitutional: Positive for weight loss.  HENT: Negative for hearing loss.   Eyes: Negative for blurred vision.  Respiratory: Negative for shortness of breath.   Cardiovascular: Negative for chest pain.  Gastrointestinal: Positive for abdominal pain, diarrhea and nausea.  Musculoskeletal: Positive for falls.       Falls x 2 in the past  Skin: Negative for rash.  Neurological:       +tremor    Psychiatric/Behavioral: Negative for depression.   Past Medical History:  Diagnosis Date  . ADHD   . Anemia   . ANXIETY 06/06/2007  . Arthritis    related to lupus  . Asthma   . BACK PAIN 02/22/2009  . Cervical cancer ([place])    LEEP 2005   . Chicken pox   . Chronic pain    goes to pain management provider  . Collagen vascular disease ([place])   . Connective tissue disease ([place])   . DEPRESSION 06/06/2007  . Essential tremor   . Fibromyalgia   . Fibromyalgia   . FREQUENCY, URINARY 07/07/2007  . Gastritis   . GERD 06/06/2007  . Glaucoma   . Headache    h/o migraines   . History of hiatal hernia   . History of kidney stones   . HYPOTHYROIDISM 06/06/2007   . INTERSTITIAL CYSTITIS 09/11/2010  . Lupus ([place]) 2006  . Lupus ([place])   . Menopause   . Migraine   . Mitral valve regurgitation   . NEPHROLITHIASIS, HX OF 11/05/2010  . Neuropathy   . Neuropathy   . OCD (obsessive compulsive disorder)   . Optic neuritis    2005  . OSTEOPENIA 10/14/2009  . Osteoporosis   . OVERACTIVE BLADDER 10/14/2009  . PAIN, CHRONIC NEC 06/06/2007  . Panniculitis   . Parkinson disease ([place]) 04/03/2019  . POLYARTHRITIS 08/19/2007  . Raynaud's disease   . Sicca syndrome ([place])   . 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 Vaginal prolapse    Dr. Marcelline Mates [facility]   . Vasculitis ([place])   . WEIGHT GAIN 02/22/2009   Past Surgical History:  Procedure Laterality Date  . APPENDECTOMY    . CERVICAL BIOPSY  W/ LOOP ELECTRODE EXCISION  2005  . CHOLECYSTECTOMY    . ESOPHAGOGASTRODUODENOSCOPY (EGD) WITH PROPOFOL N/A 08/27/2017   Procedure: ESOPHAGOGASTRODUODENOSCOPY (EGD) WITH PROPOFOL;  Surgeon: Manya Silvas, MD;  Location: [facility] ENDOSCOPY;  Service: Endoscopy;  Laterality: N/A;  . HAND SURGERY     repair of left metatarsal   . HARDWARE REMOVAL Right 02/16/2017   Procedure: HARDWARE REMOVAL FROM HIP;  Surgeon: Hessie Knows, MD;  Location: [facility] ORS;  Service: Orthopedics;  Laterality: Right;  . JOINT REPLACEMENT    . OTHER SURGICAL HISTORY     left 3rd metatarsal fracture repair 2011   . REVISION TOTAL HIP ARTHROPLASTY  06/2009   replacement 2010 then screw and plate removal 4010; right hip  . TONSILLECTOMY    . TONSILLECTOMY    . wrist  ligament repair bilateral    . WRIST RECONSTRUCTION    . WRIST SURGERY     laceration of right wrist ligaments  . wrist surgery     laceration of left wrist surgery    Family History  Problem Relation Age of Onset  . Hypertension Mother   . Arthritis Mother   . Heart disease Mother        afib  . Asthma Sister   . Asthma Daughter   . Arthritis Daughter   . Heart disease Daughter        ?heart condition on BB  . ADD / ADHD  Daughter   . Pancreatic cancer Father   . Cancer Father        pancreatitic   . Diabetes Maternal Grandmother   . Heart disease Maternal Grandmother   . Arthritis Maternal Grandmother   . Hypertension Maternal Grandmother   . Colon cancer Maternal Uncle 16  . Crohn's disease Maternal Aunt   . Diabetes Maternal Aunt   . Breast cancer Cousin 55       maternal  . Cancer Cousin        m cousin breat cancer s/p removal both breasts    Social History   Socioeconomic History  . Marital status: Single    Spouse name: Not on file  . Number of children: 1  . Years of education: Not on file  . Highest education level: Not on file  Occupational History  . Occupation: DISABLED    Employer: UNEMPLOYED  Tobacco Use  . Smoking status: Never Smoker  . Smokeless tobacco: Never Used  Substance and Sexual Activity  . Alcohol use: No  . Drug use: No  . Sexual activity: Not Currently    Birth control/protection: None, Post-menopausal  Other Topics Concern  . 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 Difficulty of Paying Living Expenses: Not hard at all  Food Insecurity: No Food Insecurity  . Worried About Charity fundraiser in the Last Year: Never true  . Ran Out of Food in the Last Year: Never true  Transportation Needs: No Transportation Needs  . Lack of Transportation (Medical): No  . Lack of Transportation (Non-Medical): No  Physical Activity: Insufficiently Active  . Days of Exercise per Week: 7 days  . Minutes of Exercise per Session: 20 min  Stress: No Stress Concern Present  . Feeling of Stress : Only a little  Social Connections:   . Frequency of Communication with Friends and Family:   . Frequency of Social Gatherings with Friends and Family:   . Attends Religious  Services:   . Active Member of Clubs or Organizations:   . Attends Archivist Meetings:   Marland Kitchen Marital Status:   Intimate Partner Violence:   . Fear of Current or Ex-Partner:   .  Emotionally Abused:   Marland Kitchen Physically Abused:   . Sexually Abused:    Current Meds  Medication Sig  . acetaminophen (ACETAMINOPHEN 8 HOUR) 650 MG CR tablet Take 650 mg by mouth every 8 (eight) hours as needed for pain.  Marland Kitchen albuterol (VENTOLIN HFA) 108 (90 Base) MCG/ACT inhaler Inhale 1-2 puffs into the lungs every 4 (four) hours as needed for wheezing or shortness of breath.  . ARIPiprazole (ABILIFY) 20 MG tablet Take 20 mg by mouth daily.  . Ascorbic Acid (VITAMIN C) 500 MG CHEW Chew 2,500 mg by mouth daily.  Marland Kitchen aspirin 325 MG tablet Take 1 tablet (325 mg total) by mouth daily.  . Biotin 10 MG CAPS Take by mouth.  . Biotin 5000 MCG SUBL Place 10,000 mcg under the tongue daily.  . calcipotriene (DOVONOX) 0.005 % ointment calcipotriene 0.005 % topical ointment  APPLY A THIN LAYER TO THE AFFECTED AREA(S) BY TOPICAL ROUTE ONCE DAILY ; RUB IN GENTLY AND COMPLETELY  . carbidopa-levodopa (SINEMET IR) 10-100 MG tablet Take 0.5 tablets by mouth 3 (three) times daily.  . Cholecalciferol (VITAMIN D3) 2000 units capsule Vitamin D3 2,000 unit capsule  . dapsone 25 MG tablet Take by mouth daily.   Marland Kitchen dexmethylphenidate (FOCALIN XR) 20 MG 24 hr capsule dexmethylphenidate ER 20 mg capsule,extended release biphasic50-50  . diclofenac sodium (VOLTAREN) 1 % GEL Apply 2 g topically 4 (four) times daily as needed (for knee & finger pain.).   Marland Kitchen dicyclomine (BENTYL) 10 MG capsule dicyclomine 10 mg capsule  every 8hours as needed  . DULoxetine (CYMBALTA) 60 MG capsule Take 60 mg by mouth 2 (two) times daily.  Regino Schultze Bandages & Supports (MEDICAL COMPRESSION STOCKINGS) MISC Please provide 5mHg compression stockings  . fentaNYL (DURAGESIC - DOSED MCG/HR) 75 MCG/HR Place 75 mcg onto the skin every 3 (three) days.  . Ferrous  Sulfate (IRON PO) Take by mouth.  . fluocinonide ointment (LIDEX) 0.05 % fluocinonide 0.05 % topical ointment  APPLY TO THE AFFECTED AREA(S) BY TOPICAL ROUTE 2 TIMES PER DAY  . Fluticasone-Salmeterol (ADVAIR DISKUS) 250-50 MCG/DOSE AEPB Inhale 1 puff into the lungs 2 (two) times daily. Rinse mouth  . folic acid (FOLVITE) 1 MG tablet Take 1 tablet (1 mg total) by mouth daily.  .Marland Kitchengabapentin (NEURONTIN) 100 MG capsule Take 100 mg by mouth 3 (three) times daily.   . hydrocortisone (PROCTOZONE-HC) 2.5 % rectal cream Proctozone-HC 2.5 % topical cream perineal applicator  . hydroxychloroquine (PLAQUENIL) 200 MG tablet Plaquenil 200 mg tablet  Take 1 tablet twice a day by oral route after meals for 90 days.  .Marland Kitchenibuprofen (ADVIL,MOTRIN) 800 MG tablet Take 1 tablet (800 mg total) by mouth every 8 (eight) hours as needed.  . imiquimod (ALDARA) 5 % cream imiquimod 5 % topical cream packet  APPLY TO THE AFFECTED AREA(S) BY TOPICAL ROUTE 3 TIMES PER WEEK at bedtime  . isosorbide mononitrate (IMDUR) 60 MG 24 hr tablet Take 1 tablet (60 mg total) by mouth daily.  .Marland Kitchenlevothyroxine (SYNTHROID) 50 MCG tablet Take 1 tablet (50 mcg total) by mouth daily before breakfast. 30 minutes before food  . LORazepam (ATIVAN) 2 MG tablet Take 1-2 mg by mouth See admin instructions. 1 MG DAILY AS NEEDED FOR ANXIETY & SCHEDULED AT BEDTIME EACH NIGHT  . methotrexate 250 MG/10ML injection Inject into the skin once a week.   . methylphenidate 18 MG PO CR tablet Take by mouth.   . montelukast (SINGULAIR) 10 MG tablet montelukast 10 mg  tablet 1 pill qhs  . Multiple Vitamins-Minerals (MULTI-VITAMIN GUMMIES PO) Multi Vitamin  GUMMIES  . omeprazole (PRILOSEC) 40 MG capsule Take 40 mg by mouth 2 (two) times daily.  . ondansetron (ZOFRAN) 4 MG tablet Zofran 8 mg tablet  Take 1 tablet every 8 hours by oral route as needed.  Marland Kitchen PARoxetine (PAXIL) 10 MG tablet Take 1 tablet (10 mg total) by mouth at bedtime.  . polyethylene glycol powder  (GLYCOLAX/MIRALAX) powder Take 17 g by mouth daily.  . Polyvinyl Alcohol-Povidone (REFRESH OP) Place 1 drop into both eyes 2 (two) times daily.  . predniSONE (DELTASONE) 10 MG tablet prednisone 10 mg tablet  1 daily  . Probiotic Product (PROBIOTIC PO) Probiotic 10 billion cell capsule  daily  . silver sulfADIAZINE (SILVADENE) 1 % cream silver sulfadiazine 1 % topical cream  . Sodium Chloride 3 % AERS Saline Nasal Mist 3 %  spray nose deeply every hour or two and blow nose  . sucralfate (CARAFATE) 1 g tablet sucralfate 1 gram tablet  . terbinafine (LAMISIL AT) 1 % cream Apply 1 application topically 2 (two) times daily.  Marland Kitchen tiZANidine (ZANAFLEX) 4 MG capsule Take by mouth.  . topiramate (TOPAMAX) 100 MG tablet 100-150 mg. 100 in am and 150 qhs  . triamcinolone ointment (KENALOG) 0.5 % Apply 1 application topically daily.  . valACYclovir (VALTREX) 500 MG tablet valacyclovir 500 mg tablet  1 daily  . [DISCONTINUED] famotidine (PEPCID) 40 MG tablet Take 40 mg by mouth daily.  . [DISCONTINUED] pantoprazole (PROTONIX) 40 MG tablet Take one tablet by mouth twice daily (Patient taking differently: TAKE 1 TABLET (40 MG) BY MOUTH ONCE DAILY IN THE MORNING.)   Allergies  Allergen Reactions  . Bee Venom Itching and Swelling    Affected area Affected area Affected area Affected area   . Fish Allergy Hives, Swelling and Rash    Facial swelling  Facial swelling  Facial swelling Facial swelling  Facial swelling   . Latex Anaphylaxis, Rash and Shortness Of Breath    Rash  Rash    . Prasterone Other (See Comments) and Nausea And Vomiting    rash Other reaction(s): Headache Headaches.  . Shellfish Allergy Hives, Other (See Comments), Rash and Swelling    Facial swelling Uncoded Allergy. Allergen: seafood Uncoded Allergy. Allergen: CATS, Other Reaction: itch, wheezing Uncoded Allergy. Allergen: COMPAZINE, Other Reaction: tremors Facial swelling Facial swelling Uncoded Allergy.  Allergen: seafood Uncoded Allergy. Allergen: CATS, Other Reaction: itch, wheezing Uncoded Allergy. Allergen: COMPAZINE, Other Reaction: tremors Facial swelling Uncoded Allergy. Allergen: seafood Uncoded Allergy. Allergen: CATS, Other Reaction: itch, wheezing Uncoded Allergy. Allergen: COMPAZINE, Other Reaction: tremors Facial swelling Facial swelling Uncoded Allergy. Allergen: seafood Uncoded Allergy. Allergen: CATS, Other Reaction: itch, wheezing Uncoded Allergy. Allergen: COMPAZINE, Other Reaction: tremors Facial swelling Facial swelling Uncoded Allergy. Allergen: seafood Uncoded Allergy. Allergen: CATS, Other Reaction: itch, wheezing Uncoded Allergy. Allergen: COMPAZINE, Other Reaction: tremors   . Shellfish-Derived Products Hives, Other (See Comments), Rash and Swelling    Facial swelling Uncoded Allergy. Allergen: seafood Uncoded Allergy. Allergen: CATS, Other Reaction: itch, wheezing Uncoded Allergy. Allergen: COMPAZINE, Other Reaction: tremors Facial swelling Facial swelling Uncoded Allergy. Allergen: seafood Uncoded Allergy. Allergen: CATS, Other Reaction: itch, wheezing Uncoded Allergy. Allergen: COMPAZINE, Other Reaction: tremors Facial swelling Uncoded Allergy. Allergen: seafood Uncoded Allergy. Allergen: CATS, Other Reaction: itch, wheezing Uncoded Allergy. Allergen: COMPAZINE, Other Reaction: tremors Facial swelling  . Compazine [Prochlorperazine Edisylate] Other (See Comments)    tremors  . Dhea [Nutritional Supplements] Other (See Comments)  Headaches.  . Dilaudid [Hydromorphone Hcl]     ? reaction  . Lactose   . Lactose Intolerance (Gi)   . Other Other (See Comments)    Other reaction(s): Unknown   . Prochlorperazine     Other reaction(s): Other (See Comments) ticks  . Promethazine     Other reaction(s): Other (See Comments) Ticks  . Promethazine Hcl Other (See Comments)    CNS disorder  . Reclast [Zoledronic Acid]     Weakness could not move  limbs, fatigue, increase sleep  . Clarithromycin Hives and Rash  . Clotrimazole Swelling and Rash  . Hydromorphone Hcl Rash and Hives    "Rash all over"  . Penicillins Rash and Other (See Comments)    Has patient had a PCN reaction causing immediate rash, facial/tongue/throat swelling, SOB or lightheadedness with hypotension:No Has patient had a PCN reaction causing severe rash involving mucus membranes or skin necrosis:No Has patient had a PCN reaction that required hospitalization:No Has patient had a PCN reaction occurring within the last 10 years:No If all of the above answers are "NO", then may proceed with Cephalosporin use.   Recent Results (from the past 2160 hour(s))  Vitamin D (25 hydroxy)     Status: Abnormal   Collection Time: 01/16/20  8:54 AM  Result Value Ref Range   VITD 25.72 (L) 30.00 - 100.00 ng/mL  TSH     Status: Abnormal   Collection Time: 01/16/20  8:54 AM  Result Value Ref Range   TSH 8.62 (H) 0.35 - 4.50 uIU/mL  Lipid panel     Status: Abnormal   Collection Time: 01/16/20  8:54 AM  Result Value Ref Range   Cholesterol 196 0 - 200 mg/dL    Comment: ATP III Classification       Desirable:  < 200 mg/dL               Borderline High:  200 - 239 mg/dL          High:  > = 240 mg/dL   Triglycerides 91.0 0.0 - 149.0 mg/dL    Comment: Normal:  <150 mg/dLBorderline High:  150 - 199 mg/dL   HDL 65.90 >39.00 mg/dL   VLDL 18.2 0.0 - 40.0 mg/dL   LDL Cholesterol 111 (H) 0 - 99 mg/dL   Total CHOL/HDL Ratio 3     Comment:                Men          Women1/2 Average Risk     3.4          3.3Average Risk          5.0          4.42X Average Risk          9.6          7.13X Average Risk          15.0          11.0                       NonHDL 129.63     Comment: NOTE:  Non-HDL goal should be 30 mg/dL higher than patient's LDL goal (i.e. LDL goal of < 70 mg/dL, would have non-HDL goal of < 100 mg/dL)  CBC with Differential/Platelet     Status: Abnormal   Collection Time:  01/16/20  8:54 AM  Result Value Ref Range   WBC 4.0 4.0 -  10.5 K/uL   RBC 4.64 3.87 - 5.11 Mil/uL   Hemoglobin 13.0 12.0 - 15.0 g/dL   HCT 41.0 36.0 - 46.0 %   MCV 88.3 78.0 - 100.0 fl   MCHC 31.8 30.0 - 36.0 g/dL   RDW 13.9 11.5 - 15.5 %   Platelets 144.0 (L) 150.0 - 400.0 K/uL   Neutrophils Relative % 45.2 43.0 - 77.0 %   Lymphocytes Relative 42.2 12.0 - 46.0 %   Monocytes Relative 7.9 3.0 - 12.0 %   Eosinophils Relative 4.0 0.0 - 5.0 %   Basophils Relative 0.7 0.0 - 3.0 %   Neutro Abs 1.8 1.4 - 7.7 K/uL   Lymphs Abs 1.7 0.7 - 4.0 K/uL   Monocytes Absolute 0.3 0.1 - 1.0 K/uL   Eosinophils Absolute 0.2 0.0 - 0.7 K/uL   Basophils Absolute 0.0 0.0 - 0.1 K/uL  Comprehensive metabolic panel     Status: Abnormal   Collection Time: 01/16/20  8:54 AM  Result Value Ref Range   Sodium 139 135 - 145 mEq/L   Potassium 3.7 3.5 - 5.1 mEq/L   Chloride 105 96 - 112 mEq/L   CO2 27 19 - 32 mEq/L   Glucose, Bld 94 70 - 99 mg/dL   BUN 17 6 - 23 mg/dL   Creatinine, Ser 1.01 0.40 - 1.20 mg/dL   Total Bilirubin 0.5 0.2 - 1.2 mg/dL   Alkaline Phosphatase 75 39 - 117 U/L   AST 18 0 - 37 U/L   ALT 18 0 - 35 U/L   Total Protein 6.7 6.0 - 8.3 g/dL   Albumin 4.3 3.5 - 5.2 g/dL   GFR 56.86 (L) >60.00 mL/min   Calcium 8.9 8.4 - 10.5 mg/dL  Urinalysis, Routine w reflex microscopic     Status: None   Collection Time: 01/22/20 10:04 AM  Result Value Ref Range   Color, Urine YELLOW YELLOW   APPearance CLEAR CLEAR   Specific Gravity, Urine 1.013 1.001 - 1.03   pH 5.5 5.0 - 8.0   Glucose, UA NEGATIVE NEGATIVE   Bilirubin Urine NEGATIVE NEGATIVE   Ketones, ur NEGATIVE NEGATIVE   Hgb urine dipstick NEGATIVE NEGATIVE   Protein, ur NEGATIVE NEGATIVE   Nitrite NEGATIVE NEGATIVE   Leukocytes,Ua NEGATIVE NEGATIVE  Urine Culture     Status: None   Collection Time: 01/22/20 10:05 AM   Specimen: Urine  Result Value Ref Range   MICRO NUMBER: [id number]    SPECIMEN QUALITY: Adequate    Sample Source NOT  GIVEN    STATUS: FINAL    ISOLATE 1:      Growth of mixed flora was isolated, suggesting probable contamination. No further testing will be performed. If clinically indicated, recollection using a method to minimize contamination, with prompt transfer to Urine Culture Transport Tube, is  recommended.   Novel Coronavirus, NAA ([facility])     Status: None   Collection Time: 01/31/20  2:35 PM   Specimen: Nasopharyngeal(NP) swabs in vial transport medium   NASOPHARYNGE  TESTING  Result Value Ref Range   SARS-CoV-2, NAA Not Detected Not Detected    Comment: This nucleic acid amplification test was developed and its performance characteristics determined by [employer]. Nucleic acid amplification tests include RT-PCR and TMA. This test has not been FDA cleared or approved. This test has been authorized by FDA under an Emergency Use Authorization (EUA). This test is only authorized for the duration of time the declaration that circumstances exist justifying the authorization of the  emergency use of in vitro diagnostic tests for detection of SARS-CoV-2 virus and/or diagnosis of COVID-19 infection under section 564(b)(1) of the Act, 21 U.S.C. 563SLH-7(D) (1), unless the authorization is terminated or revoked sooner. When diagnostic testing is negative, the possibility of a false negative result should be considered in the context of a patient's recent exposures and the presence of clinical signs and symptoms consistent with COVID-19. An individual without symptoms of COVID-19 and who is not shedding SARS-CoV-2 virus wo uld expect to have a negative (not detected) result in this assay.    Objective  Body mass index is 31.75 kg/m. Wt Readings from Last 3 Encounters:  04/02/20 208 lb 12.8 oz (94.7 kg)  12/28/19 213 lb (96.6 kg)  11/17/19 208 lb (94.3 kg)   Temp Readings from Last 3 Encounters:  04/02/20 (!) 96.3 F (35.7 C) (Temporal)  10/10/19 99.2 F (37.3 C) (Oral)  08/17/19  98.2 F (36.8 C) (Oral)   BP Readings from Last 3 Encounters:  04/02/20 124/78  12/28/19 132/84  10/10/19 (!) 115/59   Pulse Readings from Last 3 Encounters:  04/02/20 94  12/28/19 84  10/10/19 85    Physical Exam Vitals and nursing note reviewed.  Constitutional:      Appearance: Normal appearance. She is well-developed and well-groomed. She is obese.  HENT:     Head: Normocephalic and atraumatic.  Eyes:     Conjunctiva/sclera: Conjunctivae normal.     Pupils: Pupils are equal, round, and reactive to light.  Cardiovascular:     Rate and Rhythm: Normal rate and regular rhythm.     Heart sounds: Normal heart sounds. No murmur.  Pulmonary:     Effort: Pulmonary effort is normal.     Breath sounds: Normal breath sounds.  Abdominal:     General: Abdomen is flat. Bowel sounds are normal.     Tenderness: There is abdominal tenderness in the epigastric area, periumbilical area and left lower quadrant.     Comments: Mild ttp  Skin:    General: Skin is warm and moist.  Neurological:     General: No focal deficit present.     Mental Status: She is alert and oriented to person, place, and time. Mental status is at baseline.     Gait: Gait normal.     Comments: BL walks with cane  Psychiatric:        Attention and Perception: Attention and perception normal.        Mood and Affect: Mood and affect normal.        Speech: Speech normal.        Behavior: Behavior normal. Behavior is cooperative.        Thought Content: Thought content normal.        Cognition and Memory: Cognition and memory normal.        Judgment: Judgment normal.     Assessment  Plan  Abdominal pain, generalized Loose stools Abdominal cramps Weight loss  Small hiatal hernia on EGD 07/2017 with neg bx STOP Pepcid still on omeprazole 40 mg bid  Refer back to [facility] last in 01/2020 they were considering gastric emptying study 01/2020 but pt never had this done for some reason   Hypothyroidism, unspecified  type - Plan: TSH Cont levo 50 mcg qd  Elevated serum creatinine - Plan: Basic Metabolic Panel (BMET)  HM Had flu shot utd Had pna 23in 2013 consider repeat in future  prevnar10/03/2014 Tdap11/20/17 covid vx had 1/2 03/19/20   Think  about shingrix vaccinein future prev discussed Consider repeat pna 23 vaccine in the future  ImmuneMMR and hep B  Colonoscopy 12/17/14 h/o polyps Dr. Rayann Heman I[facility]/EH polyp hyperplastic consider repeat in 5-10 years FH m uncle colon cancer  EGD 08/27/2017 negative bxs +hiatal hernia   DEXA 06/29/17 osteopeniah/o osteoporosis [facility] and he was to try Reclast again though listed on her allergy list he does not think true allergy  Mammogram 08/23/19 negative ordered  Pap8/2/19 Dr. Marcelline Mates neg pap neg HPV  See CT chest  IMPRESSION: 1. No CT evidence for acute pulmonary embolus. 2. Symmetric dependent ground-glass attenuation in the lungs, similar to prior study. Imaging features likely related to hypo expansion/ compressive atelectasis.  Dermatology Barnetta Chapel saw early 10/2019    rec vitamin D3 4000 Iu daily vitamin D 06/08/19 was 24   Vitamin C58 527 7/82/42  Folic acid >35 3/61/44   Eye exam had 10/2019 will return 01/28/20   Of note [facility] rheumatology Dr. Nancy Fetter f/u sch in 01/2020, eye exam 01/28/20 to f/u HCQ monitoring  rec healthy diet and exercise    [facility] neuro Dr. Angela Nevin rheum Dr. Evette Cristal [facility] GI Dr. Arlis Porta  Cards Dr. Moses Manners Dr. Mendel Ryder endocrine Dr. Prudencio Burly   Provider: Dr. Olivia Mackie McLean-Scocuzza-Internal Medicine

## 2020-04-03 ENCOUNTER — Other Ambulatory Visit: Payer: Self-pay

## 2020-04-03 ENCOUNTER — Other Ambulatory Visit: Payer: Self-pay | Admitting: Internal Medicine

## 2020-04-03 DIAGNOSIS — E039 Hypothyroidism, unspecified: Secondary | ICD-10-CM

## 2020-04-03 LAB — BASIC METABOLIC PANEL WITH GFR
BUN: 13 mg/dL (ref 6–23)
CO2: 28 meq/L (ref 19–32)
Calcium: 8.7 mg/dL (ref 8.4–10.5)
Chloride: 107 meq/L (ref 96–112)
Creatinine, Ser: 1.01 mg/dL (ref 0.40–1.20)
GFR: 56.81 mL/min — ABNORMAL LOW
Glucose, Bld: 80 mg/dL (ref 70–99)
Potassium: 3.6 meq/L (ref 3.5–5.1)
Sodium: 141 meq/L (ref 135–145)

## 2020-04-03 LAB — TSH: TSH: 6.67 u[IU]/mL — ABNORMAL HIGH (ref 0.35–4.50)

## 2020-04-03 MED ORDER — LEVOTHYROXINE SODIUM 75 MCG PO TABS: 75.0000 ug | ORAL_TABLET | Freq: Every day | ORAL | 3 refills | Status: DC

## 2020-04-04 ENCOUNTER — Telehealth: Payer: Self-pay | Admitting: Internal Medicine

## 2020-04-04 NOTE — Telephone Encounter (Signed)
Note:

## 2020-04-04 NOTE — Telephone Encounter (Signed)
Rejection Reason - Other - Per provider, patient is already an established patient with her last appt on 01/28/20. Recommendations regarding referral will be addressed with patient by phone." [facility] said about 15 hours ago

## 2020-04-04 NOTE — Telephone Encounter (Signed)
Noted  TMS 

## 2020-04-17 ENCOUNTER — Emergency Department: Payer: Medicare Other

## 2020-04-17 ENCOUNTER — Other Ambulatory Visit: Payer: Self-pay

## 2020-04-17 ENCOUNTER — Encounter: Payer: Self-pay | Admitting: Emergency Medicine

## 2020-04-17 ENCOUNTER — Emergency Department
Admission: EM | Admit: 2020-04-17 | Discharge: 2020-04-17 | Disposition: A | Discharge status: Home / Self Care | Payer: Medicare Other | Attending: Emergency Medicine | Admitting: Emergency Medicine

## 2020-04-17 DIAGNOSIS — Z96641 Presence of right artificial hip joint: Secondary | ICD-10-CM | POA: Diagnosis not present

## 2020-04-17 DIAGNOSIS — R200 Anesthesia of skin: Secondary | ICD-10-CM | POA: Insufficient documentation

## 2020-04-17 DIAGNOSIS — Z8541 Personal history of malignant neoplasm of cervix uteri: Secondary | ICD-10-CM | POA: Insufficient documentation

## 2020-04-17 DIAGNOSIS — R202 Paresthesia of skin: Secondary | ICD-10-CM | POA: Diagnosis not present

## 2020-04-17 DIAGNOSIS — G20 Parkinson's disease: Secondary | ICD-10-CM | POA: Diagnosis not present

## 2020-04-17 DIAGNOSIS — Z79899 Other long term (current) drug therapy: Secondary | ICD-10-CM | POA: Diagnosis not present

## 2020-04-17 DIAGNOSIS — Z91040 Latex allergy status: Secondary | ICD-10-CM | POA: Diagnosis not present

## 2020-04-17 DIAGNOSIS — R519 Headache, unspecified: Secondary | ICD-10-CM | POA: Insufficient documentation

## 2020-04-17 DIAGNOSIS — E039 Hypothyroidism, unspecified: Secondary | ICD-10-CM | POA: Diagnosis not present

## 2020-04-17 DIAGNOSIS — J45909 Unspecified asthma, uncomplicated: Secondary | ICD-10-CM | POA: Diagnosis not present

## 2020-04-17 LAB — CBC WITH DIFFERENTIAL/PLATELET
Abs Immature Granulocytes: 0 10*3/uL (ref 0.00–0.07)
Basophils Absolute: 0 10*3/uL (ref 0.0–0.1)
Basophils Relative: 1 %
Eosinophils Absolute: 0.1 10*3/uL (ref 0.0–0.5)
Eosinophils Relative: 4 %
HCT: 40.8 % (ref 36.0–46.0)
Hemoglobin: 12.7 g/dL (ref 12.0–15.0)
Immature Granulocytes: 0 %
Lymphocytes Relative: 56 %
Lymphs Abs: 1.9 10*3/uL (ref 0.7–4.0)
MCH: 28 pg (ref 26.0–34.0)
MCHC: 31.1 g/dL (ref 30.0–36.0)
MCV: 90.1 fL (ref 80.0–100.0)
Monocytes Absolute: 0.2 10*3/uL (ref 0.1–1.0)
Monocytes Relative: 6 %
Neutro Abs: 1.1 10*3/uL — ABNORMAL LOW (ref 1.7–7.7)
Neutrophils Relative %: 33 %
Platelets: 153 10*3/uL (ref 150–400)
RBC: 4.53 MIL/uL (ref 3.87–5.11)
RDW: 12.9 % (ref 11.5–15.5)
WBC: 3.3 10*3/uL — ABNORMAL LOW (ref 4.0–10.5)
nRBC: 0 % (ref 0.0–0.2)

## 2020-04-17 LAB — COMPREHENSIVE METABOLIC PANEL
ALT: 19 U/L (ref 0–44)
AST: 20 U/L (ref 15–41)
Albumin: 4.5 g/dL (ref 3.5–5.0)
Alkaline Phosphatase: 87 U/L (ref 38–126)
Anion gap: 8 (ref 5–15)
BUN: 17 mg/dL (ref 6–20)
CO2: 27 mmol/L (ref 22–32)
Calcium: 8.8 mg/dL — ABNORMAL LOW (ref 8.9–10.3)
Chloride: 105 mmol/L (ref 98–111)
Creatinine, Ser: 0.68 mg/dL (ref 0.44–1.00)
GFR calc Af Amer: >60 mL/min (ref >60)
GFR calc non Af Amer: >60 mL/min (ref >60)
Glucose, Bld: 102 mg/dL — ABNORMAL HIGH (ref 70–99)
Potassium: 3.5 mmol/L (ref 3.5–5.1)
Sodium: 140 mmol/L (ref 135–145)
Total Bilirubin: 0.6 mg/dL (ref 0.3–1.2)
Total Protein: 7.2 g/dL (ref 6.5–8.1)

## 2020-04-17 LAB — C-REACTIVE PROTEIN: CRP: 0.6 mg/dL (ref <1.0)

## 2020-04-17 LAB — SEDIMENTATION RATE: Sed Rate: 5 mm/hr (ref 0–30)

## 2020-04-17 LAB — GLUCOSE, CAPILLARY: Glucose-Capillary: 86 mg/dL (ref 70–99)

## 2020-04-17 MED ORDER — FENTANYL CITRATE (PF) 100 MCG/2ML IJ SOLN
50.0000 ug | Freq: Once | INTRAMUSCULAR | Status: AC
  Administered 2020-04-17: 50 ug via INTRAVENOUS
  Filled 2020-04-17: qty 2

## 2020-04-17 MED ORDER — DIPHENHYDRAMINE HCL 50 MG/ML IJ SOLN
25.0000 mg | Freq: Once | INTRAMUSCULAR | Status: AC
  Administered 2020-04-17: 25 mg via INTRAVENOUS
  Filled 2020-04-17: qty 1

## 2020-04-17 MED ORDER — SODIUM CHLORIDE 0.9 % IV BOLUS
1000.0000 mL | Freq: Once | INTRAVENOUS | Status: AC
  Administered 2020-04-17: 1000 mL via INTRAVENOUS

## 2020-04-17 MED ORDER — KETOROLAC TROMETHAMINE 30 MG/ML IJ SOLN
15.0000 mg | Freq: Once | INTRAMUSCULAR | Status: AC
  Administered 2020-04-17: 15 mg via INTRAVENOUS
  Filled 2020-04-17: qty 1

## 2020-04-17 MED ORDER — METOCLOPRAMIDE HCL 5 MG/ML IJ SOLN
10.0000 mg | Freq: Once | INTRAMUSCULAR | Status: AC
  Administered 2020-04-17: 10 mg via INTRAVENOUS
  Filled 2020-04-17: qty 2

## 2020-04-17 MED ORDER — GADOBUTROL 1 MMOL/ML IV SOLN
9.0000 mL | Freq: Once | INTRAVENOUS | Status: AC | PRN
  Administered 2020-04-17: 9 mL via INTRAVENOUS

## 2020-04-17 NOTE — Discharge Instructions (Addendum)
As we discussed, considerations for your headache would include:  - Atypical migraine - Atypical cluster headache given the facial symptoms - Cervicogenic headache from your neck degenerative disease - Paresthesias related to possible neuropathy from COVID vaccine/other autoimmune process  Call Dr. Manuella Ghazi to discuss

## 2020-04-17 NOTE — ED Triage Notes (Addendum)
Pt started with headache at 1000 pm last night.  Headache in different location than normal migraines.  No photophobia. Has had nausea.  Numbness to tongue and lips present when pt woke.  LKW 1000 pm 04/16/20.  No blood thinners. Tingling sensation also present to both hands. VAN negative. No facial droop.

## 2020-04-17 NOTE — ED Provider Notes (Signed)
[facility] Emergency Department Provider Note  ____________________________________________   First MD Initiated Contact with Patient 04/17/20 1027     (approximate)  I have reviewed the triage vital signs and the nursing notes.   HISTORY  Chief Complaint Headache    HPI [name] [name] is a 54 y.o. female with past medical history as below including lupus, chronic pain, here with multiple complaints.  The patient's primary complaint is a sharp, stabbing, upper and posterior headache, along with numbness and paresthesias in the bilateral upper extremities.  The patient states that the symptoms have been present for the last 24 hours.  She has a history of chronic migraines and follows with Dr. Brigitte Pulse, but states her current episodes are different.  She also endorses tingling and paresthesias along her bilateral upper cheeks, as well as possible new difficulty urinating.  These are all new for her.  She does have history of lupus involvement in the brain, but does not recall specifically what.  Denies any fevers or chills.  Nursing medication changes.  No neck pain or neck stiffness.        Past Medical History:  Diagnosis Date  . ADHD   . Anemia   . ANXIETY 06/06/2007  . Arthritis    related to lupus  . Asthma   . BACK PAIN 02/22/2009  . Cervical cancer ([place])    LEEP 2005   . Chicken pox   . Chronic pain    goes to pain management provider  . Collagen vascular disease ([place])   . Connective tissue disease ([place])   . DEPRESSION 06/06/2007  . Essential tremor   . Fibromyalgia   . Fibromyalgia   . FREQUENCY, URINARY 07/07/2007  . Gastritis   . GERD 06/06/2007  . Glaucoma   . Headache    h/o migraines   . History of hiatal hernia   . History of kidney stones   . HYPOTHYROIDISM 06/06/2007  . INTERSTITIAL CYSTITIS 09/11/2010  . Lupus ([place]) 2006  . Lupus ([place])   . Menopause   . Migraine   . Mitral valve regurgitation   . NEPHROLITHIASIS, HX OF  11/05/2010  . Neuropathy   . Neuropathy   . OCD (obsessive compulsive disorder)   . Optic neuritis    2005  . OSTEOPENIA 10/14/2009  . Osteoporosis   . OVERACTIVE BLADDER 10/14/2009  . PAIN, CHRONIC NEC 06/06/2007  . Panniculitis   . Parkinson disease ([place]) 04/03/2019  . POLYARTHRITIS 08/19/2007  . Raynaud's disease   . Sicca syndrome ([place])   . 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 Vaginal prolapse    Dr. Marcelline Mates [place]   . Vasculitis ([place])   . WEIGHT GAIN 02/22/2009    Patient Active Problem List   Diagnosis Date Noted  . Annual physical exam 11/17/2019  . CTS (carpal tunnel syndrome) 11/17/2019  . Vitamin D deficiency 07/04/2019  . Anemia 07/04/2019  . Chronic venous insufficiency 04/18/2019  . Lymphedema 04/18/2019  . Parkinson disease ([place]) 04/03/2019  . Connective tissue disorder ([place]) 02/14/2019  . RUQ abdominal pain 02/14/2019  . Hiatal hernia 10/06/2018  . ADHD 08/04/2018  . Lupus arthritis ([place]) 08/04/2018  . Asthma 08/04/2018  . Systemic lupus erythematosus ([place]) 08/04/2018  . Urinary incontinence 08/04/2018  . Glaucoma 08/04/2018  . Migraines 08/04/2018  . Mitral valve regurgitation 08/04/2018  . Neuropathy 08/04/2018  . OCD (obsessive compulsive disorder) 08/04/2018  . Osteopenia 08/04/2018  . Thrombocytopenia ([place]) 07/06/2018  . Chest pain 07/04/2018  .  Allergic rhinitis 12/16/2017  . Dizziness 12/16/2017  . Orthostatic hypotension 12/16/2017  . Generalized abdominal pain 12/16/2017  . Constipation 12/16/2017  . Acute nonintractable headache 12/13/2017  . Fibromyalgia 11/04/2017  . Status post hardware removal 02/16/2017  . Sicca syndrome ([place]) 06/04/2014  . Abdominal pain, epigastric 09/08/2013  . Chronic pain 03/31/2011  . NEPHROLITHIASIS, HX OF 11/05/2010  . INTERSTITIAL CYSTITIS 09/11/2010  . Overactive bladder 10/14/2009  . Disorder of bone and cartilage 10/14/2009  . Backache 02/22/2009  . WEIGHT GAIN 02/22/2009  . UNSPECIFIED OPTIC  NEURITIS 11/08/2007  . Polyarthropathy or polyarthritis of multiple sites 08/19/2007  . FREQUENCY, URINARY 07/07/2007  . Hypothyroidism 06/06/2007  . Anxiety state 06/06/2007  . Depression, recurrent ([place]) 06/06/2007  . PAIN, CHRONIC NEC 06/06/2007  . GERD 06/06/2007    Past Surgical History:  Procedure Laterality Date  . APPENDECTOMY    . CERVICAL BIOPSY  W/ LOOP ELECTRODE EXCISION  2005  . CHOLECYSTECTOMY    . ESOPHAGOGASTRODUODENOSCOPY (EGD) WITH PROPOFOL N/A 08/27/2017   Procedure: ESOPHAGOGASTRODUODENOSCOPY (EGD) WITH PROPOFOL;  Surgeon: Manya Silvas, MD;  Location: [facility] ENDOSCOPY;  Service: Endoscopy;  Laterality: N/A;  . HAND SURGERY     repair of left metatarsal   . HARDWARE REMOVAL Right 02/16/2017   Procedure: HARDWARE REMOVAL FROM HIP;  Surgeon: Hessie Knows, MD;  Location: [facility] ORS;  Service: Orthopedics;  Laterality: Right;  . JOINT REPLACEMENT    . OTHER SURGICAL HISTORY     left 3rd metatarsal fracture repair 2011   . REVISION TOTAL HIP ARTHROPLASTY  06/2009   replacement 2010 then screw and plate removal [id number]; right hip  . TONSILLECTOMY    . TONSILLECTOMY    . wrist  ligament repair bilateral    . WRIST RECONSTRUCTION    . WRIST SURGERY     laceration of right wrist ligaments  . wrist surgery     laceration of left wrist surgery     Prior to Admission medications   Medication Sig Start Date End Date Taking? Authorizing Provider  acetaminophen (ACETAMINOPHEN 8 HOUR) 650 MG CR tablet Take 650 mg by mouth every 8 (eight) hours as needed for pain.    [provider]  albuterol (VENTOLIN HFA) 108 (90 Base) MCG/ACT inhaler Inhale 1-2 puffs into the lungs every 4 (four) hours as needed for wheezing or shortness of breath. 11/30/19   McLean-Scocuzza, Nino Glow, MD  ARIPiprazole (ABILIFY) 20 MG tablet Take 20 mg by mouth daily.    [provider]  Ascorbic Acid (VITAMIN C) 500 MG CHEW Chew 2,500 mg by mouth daily.    [provider]    aspirin 325 MG tablet Take 1 tablet (325 mg total) by mouth daily. 02/18/17   Duanne Guess, PA-C  Biotin 10 MG CAPS Take by mouth.    [provider]  Biotin 5000 MCG SUBL Place 10,000 mcg under the tongue daily.    [provider]  calcipotriene (DOVONOX) 0.005 % ointment calcipotriene 0.005 % topical ointment  APPLY A THIN LAYER TO THE AFFECTED AREA(S) BY TOPICAL ROUTE ONCE DAILY ; RUB IN GENTLY AND COMPLETELY    [provider]  carbidopa-levodopa (SINEMET IR) 10-100 MG tablet Take 0.5 tablets by mouth 3 (three) times daily.    [provider]  Cholecalciferol (VITAMIN D3) 2000 units capsule Vitamin D3 2,000 unit capsule    [provider]  dapsone 25 MG tablet Take by mouth daily.     [provider]  dexmethylphenidate (  FOCALIN XR) 20 MG 24 hr capsule dexmethylphenidate ER 20 mg capsule,extended release biphasic50-50    [provider]  diclofenac sodium (VOLTAREN) 1 % GEL Apply 2 g topically 4 (four) times daily as needed (for knee & finger pain.).     [provider]  dicyclomine (BENTYL) 10 MG capsule dicyclomine 10 mg capsule  every 8hours as needed    [provider]  DULoxetine (CYMBALTA) 60 MG capsule Take 60 mg by mouth 2 (two) times daily.    [provider]  Elastic Bandages & Supports (MEDICAL COMPRESSION STOCKINGS) MISC Please provide 50mmHg compression stockings 02/09/19   Harvest Dark, MD  fentaNYL (DURAGESIC - DOSED MCG/HR) 75 MCG/HR Place 75 mcg onto the skin every 3 (three) days.    [provider]  Ferrous Sulfate (IRON PO) Take by mouth.    [provider]  fluocinonide ointment (LIDEX) 0.05 % fluocinonide 0.05 % topical ointment  APPLY TO THE AFFECTED AREA(S) BY TOPICAL ROUTE 2 TIMES PER DAY    [provider]  Fluticasone-Salmeterol (ADVAIR DISKUS) 250-50 MCG/DOSE AEPB Inhale 1 puff into the lungs 2 (two) times daily. Rinse mouth 08/17/19    McLean-Scocuzza, Nino Glow, MD  folic acid (FOLVITE) 1 MG tablet Take 1 tablet (1 mg total) by mouth daily. 07/06/18   McLean-Scocuzza, Nino Glow, MD  furosemide (LASIX) 20 MG tablet Take 20 mg by mouth daily as needed.  02/08/19 02/08/20  [provider]  gabapentin (NEURONTIN) 100 MG capsule Take 100 mg by mouth 3 (three) times daily.  05/11/19 05/10/20  [provider]  hydrocortisone (PROCTOZONE-HC) 2.5 % rectal cream Proctozone-HC 2.5 % topical cream perineal applicator    [provider]  hydroxychloroquine (PLAQUENIL) 200 MG tablet Plaquenil 200 mg tablet  Take 1 tablet twice a day by oral route after meals for 90 days.    [provider]  ibuprofen (ADVIL,MOTRIN) 800 MG tablet Take 1 tablet (800 mg total) by mouth every 8 (eight) hours as needed. 08/04/17   Laban Emperor, PA-C  imiquimod (ALDARA) 5 % cream imiquimod 5 % topical cream packet  APPLY TO THE AFFECTED AREA(S) BY TOPICAL ROUTE 3 TIMES PER WEEK at bedtime    [provider]  isosorbide mononitrate (IMDUR) 60 MG 24 hr tablet Take 1 tablet (60 mg total) by mouth daily. 05/26/18   Carrie Mew, MD  levothyroxine (SYNTHROID) 75 MCG tablet Take 1 tablet (75 mcg total) by mouth daily before breakfast. 30 minutes before food 04/03/20   McLean-Scocuzza, Nino Glow, MD  LORazepam (ATIVAN) 2 MG tablet Take 1-2 mg by mouth See admin instructions. 1 MG DAILY AS NEEDED FOR ANXIETY & SCHEDULED AT BEDTIME [facility] NIGHT    [provider]  methotrexate 250 MG/10ML injection Inject into the skin once a week.  02/20/20   [provider]  methylphenidate 18 MG PO CR tablet Take by mouth.     [provider]  montelukast (SINGULAIR) 10 MG tablet montelukast 10 mg tablet 1 pill qhs 11/30/19   McLean-Scocuzza, Nino Glow, MD  Multiple Vitamins-Minerals (MULTI-VITAMIN GUMMIES PO) Multi Vitamin  GUMMIES    [provider]  omeprazole (PRILOSEC) 40 MG capsule Take 40 mg by mouth 2 (two) times  daily.    [provider]  ondansetron (ZOFRAN) 4 MG tablet Zofran 8 mg tablet  Take 1 tablet every 8 hours by oral route as needed.    [provider]  PARoxetine (PAXIL) 10 MG tablet Take 1 tablet (10 mg  total) by mouth at bedtime. 07/04/19   Rubie Maid, MD  polyethylene glycol powder (GLYCOLAX/MIRALAX) powder Take 17 g by mouth daily. 03/29/18   Harvest Dark, MD  Polyvinyl Alcohol-Povidone (REFRESH OP) Place 1 drop into both eyes 2 (two) times daily.    [provider]  predniSONE (DELTASONE) 10 MG tablet prednisone 10 mg tablet  1 daily    [provider]  Probiotic Product (PROBIOTIC PO) Probiotic 10 billion cell capsule  daily    [provider]  silver sulfADIAZINE (SILVADENE) 1 % cream silver sulfadiazine 1 % topical cream    [provider]  Sodium Chloride 3 % AERS Saline Nasal Mist 3 %  spray nose deeply every hour or two and blow nose    [provider]  sucralfate (CARAFATE) 1 g tablet sucralfate 1 gram tablet    [provider]  terbinafine (LAMISIL AT) 1 % cream Apply 1 application topically 2 (two) times daily. 04/26/19   McLean-Scocuzza, Nino Glow, MD  tiZANidine (ZANAFLEX) 4 MG capsule Take by mouth.    [provider]  topiramate (TOPAMAX) 100 MG tablet 100-150 mg. 100 in am and 150 qhs    [provider]  triamcinolone ointment (KENALOG) 0.5 % Apply 1 application topically daily. 12/28/19   Kris Hartmann, NP  valACYclovir (VALTREX) 500 MG tablet valacyclovir 500 mg tablet  1 daily    [provider]    Allergies Bee venom, Fish allergy, Latex, Prasterone, Shellfish allergy, Shellfish-derived products, Compazine [prochlorperazine edisylate], Dhea [nutritional supplements], Dilaudid [hydromorphone hcl], Lactose, Lactose intolerance (gi), Other, Prochlorperazine, Promethazine, Promethazine hcl, Reclast [zoledronic acid], Clarithromycin, Clotrimazole, Hydromorphone hcl, and  Penicillins  Family History  Problem Relation Age of Onset  . Hypertension Mother   . Arthritis Mother   . Heart disease Mother        afib  . Asthma Sister   . Asthma Daughter   . Arthritis Daughter   . Heart disease Daughter        ?heart condition on BB  . ADD / ADHD Daughter   . Pancreatic cancer Father   . Cancer Father        pancreatitic   . Diabetes Maternal Grandmother   . Heart disease Maternal Grandmother   . Arthritis Maternal Grandmother   . Hypertension Maternal Grandmother   . Colon cancer Maternal Uncle 21  . Crohn's disease Maternal Aunt   . Diabetes Maternal Aunt   . Breast cancer Cousin 24       maternal  . Cancer Cousin        m cousin breat cancer s/p removal both breasts     Social History Social History   Tobacco Use  . Smoking status: Never Smoker  . Smokeless tobacco: Never Used  Substance Use Topics  . Alcohol use: No  . Drug use: No    Review of Systems  Review of Systems  Constitutional: Positive for fatigue. Negative for fever.  HENT: Negative for congestion and sore throat.   Eyes: Negative for visual disturbance.  Respiratory: Negative for cough and shortness of breath.   Cardiovascular: Negative for chest pain.  Gastrointestinal: Negative for abdominal pain, diarrhea and vomiting.  Genitourinary: Positive for decreased urine volume. Negative for flank pain.  Musculoskeletal: Negative for back pain and neck pain.  Skin: Negative for rash and wound.  Neurological: Positive for weakness, numbness and headaches.  All other systems reviewed and are negative.    ____________________________________________  PHYSICAL EXAM:  VITAL SIGNS: ED Triage Vitals [04/17/20 0715]  Enc Vitals Group     BP 137/75     Pulse Rate 98     Resp 18     Temp 98.2 F (36.8 C)     Temp Source Oral     SpO2 96 %     Weight 206 lb (93.4 kg)     Height 5\' 10"  (1.778 m)     Head Circumference      Peak Flow      Pain Score 10     Pain Loc       Pain Edu?      Excl. in [place]?      Physical Exam Vitals and nursing note reviewed.  Constitutional:      General: She is not in acute distress.    Appearance: She is well-developed.  HENT:     Head: Normocephalic and atraumatic.     Comments: Mild allodynia and scalp tenderness along superior parietal scalp. No skin lesions or redness. Eyes:     Conjunctiva/sclera: Conjunctivae normal.  Cardiovascular:     Rate and Rhythm: Normal rate and regular rhythm.     Heart sounds: Normal heart sounds. No murmur. No friction rub.  Pulmonary:     Effort: Pulmonary effort is normal. No respiratory distress.     Breath sounds: Normal breath sounds. No wheezing or rales.  Abdominal:     General: There is no distension.     Palpations: Abdomen is soft.     Tenderness: There is no abdominal tenderness.  Musculoskeletal:     Cervical back: Neck supple.  Skin:    General: Skin is warm.     Capillary Refill: Capillary refill takes less than 2 seconds.  Neurological:     Mental Status: She is alert.     Motor: No abnormal muscle tone.     Comments: Oriented x 3. CNII-XII intact. Strength 5/5 b/l UE and LE. Moderate resting tremor noted RUE>LUE, improves with intention. Able to ambulate at baseline with cane. Normal cerebellar testing.       ____________________________________________   LABS (all labs ordered are listed, but only abnormal results are displayed)  Labs Reviewed  CBC WITH DIFFERENTIAL/PLATELET - Abnormal; Notable for the following components:      Result Value   WBC 3.3 (*)    Neutro Abs 1.1 (*)    All other components within normal limits  COMPREHENSIVE METABOLIC PANEL - Abnormal; Notable for the following components:   Glucose, Bld 102 (*)    Calcium 8.8 (*)    All other components within normal limits  GLUCOSE, CAPILLARY  SEDIMENTATION RATE  C-REACTIVE PROTEIN  URINALYSIS, COMPLETE (UACMP) WITH MICROSCOPIC     ____________________________________________  EKG: None ________________________________________  RADIOLOGY All imaging, including plain films, CT scans, and ultrasounds, independently reviewed by me, and interpretations confirmed via formal radiology reads.  ED MD interpretation:   MR Brain/C-Spine: [place] changes of C-Spine but no acute abnormality, normal MR brain for age CT Head: [place]  Official radiology report(s): CT Head Wo Contrast  Result Date: 04/17/2020 CLINICAL DATA:  Headache, acute, normal neuro exam. Additional history provided: History of Parkinson's and lupus. EXAM: CT HEAD WITHOUT CONTRAST TECHNIQUE: Contiguous axial images were obtained from the base of the skull through the vertex without intravenous contrast. COMPARISON:  Head CT 12/26/2018 FINDINGS: Brain: Cerebral volume is normal. There is no acute intracranial hemorrhage. No demarcated cortical infarct. No extra-axial fluid collection. No evidence of intracranial mass.  No midline shift. Vascular: No hyperdense vessel. Skull: Normal. Negative for fracture or focal lesion. Sinuses/Orbits: Visualized orbits show no acute finding. No significant paranasal sinus disease or mastoid effusion at the imaged levels. IMPRESSION: Unremarkable CT appearance of the brain. No evidence of acute intracranial abnormality. Electronically Signed   By: Kellie Simmering DO   On: 04/17/2020 07:51   MR Brain W and Wo Contrast  Result Date: 04/17/2020 CLINICAL DATA:  Headache. History of lupus an optic neuritis. Paresthesias. Upper extremity paresthesias. EXAM: MRI HEAD WITHOUT AND WITH CONTRAST TECHNIQUE: Multiplanar, multiecho pulse sequences of the brain and surrounding structures were obtained without and with intravenous contrast. CONTRAST:  63mL GADAVIST GADOBUTROL 1 MMOL/ML IV SOLN COMPARISON:  Head CT 12/26/2018.  Head CT same day.  MRI 03/02/2018 FINDINGS: Brain: The brain has a normal appearance without evidence of malformation, atrophy,  old or acute small or large vessel infarction, mass lesion, hemorrhage, hydrocephalus or extra-axial collection. After contrast administration, no abnormal enhancement occurs. Vascular: Major vessels at the base of the brain show flow. Venous sinuses appear patent. Skull and upper cervical spine: Normal. Sinuses/Orbits: Clear/normal. Other: None significant. IMPRESSION: Normal examination. Electronically Signed   By: Nelson Chimes M.D.   On: 04/17/2020 13:53   MR Cervical Spine W or Wo Contrast  Result Date: 04/17/2020 CLINICAL DATA:  Headache.  Numbness and tingling of the arm. EXAM: MRI CERVICAL SPINE WITHOUT AND WITH CONTRAST TECHNIQUE: Multiplanar and multiecho pulse sequences of the cervical spine, to include the craniocervical junction and cervicothoracic junction, were obtained without and with intravenous contrast. CONTRAST:  52mL GADAVIST GADOBUTROL 1 MMOL/ML IV SOLN COMPARISON:  05/29/2019 FINDINGS: Alignment: Exaggerated upper cervical lordosis. Kyphotic curvature in the region from C4-C7. Degenerative anterolisthesis at C4-5 of 2 mm. Vertebrae: No fracture or primary bone lesion. Cord: No cord compression or primary cord lesion. Posterior Fossa, vertebral arteries, paraspinal tissues: Negative Disc levels: Foramen magnum is widely patent.  C1-2 is unremarkable. C2-3: Mild bulging of the disc. Facet osteoarthritis on the left. No canal stenosis. Mild left foraminal stenosis. C3-4: Bulging of the disc more towards the left. Facet osteoarthritis on the left. No canal stenosis. Moderate left foraminal stenosis. C4-5: Facet osteoarthritis on the left with 2 mm of anterolisthesis. No canal stenosis. Mild foraminal narrowing on the left. C5-6: Endplate osteophytes and bulging of the disc. No compressive canal stenosis. Mild bilateral foraminal narrowing, right more than left. C6-7: Endplate osteophytes and bulging of the disc. No canal stenosis. Mild bilateral foraminal narrowing, right more than left, not  likely compressive. C7-T1: Normal interspace. IMPRESSION: C2-3: Facet osteoarthritis on the left. Mild foraminal narrowing on the left. C3-4: Facet osteoarthritis on the left. Moderate foraminal narrowing on the left. C4-5: Facet osteoarthritis on the left. 2 mm of degenerative anterolisthesis. Mild foraminal narrowing on the left. C5-6: Spondylosis with mild foraminal narrowing right more than left. C6-7: Spondylosis with mild foraminal narrowing right more than left. No change appreciated since the study of 05/29/2019. Electronically Signed   By: Nelson Chimes M.D.   On: 04/17/2020 13:51    ____________________________________________  PROCEDURES   Procedure(s) performed (including Critical Care):  Procedures  ____________________________________________  INITIAL IMPRESSION / MDM / [place] / ED COURSE  As part of my medical decision making, I reviewed the following data within the [place] notes reviewed and incorporated, Old chart reviewed, Notes from prior ED visits, and [place] Controlled Substance Database       *[name] [name] was evaluated  in Emergency Department on 04/17/2020 for the symptoms described in the history of present illness. She was evaluated in the context of the global COVID-19 pandemic, which necessitated consideration that the patient might be at risk for infection with the SARS-CoV-2 virus that causes COVID-19. Institutional protocols and algorithms that pertain to the evaluation of patients at risk for COVID-19 are in a state of rapid change based on information released by regulatory bodies including the CDC and federal and state organizations. These policies and algorithms were followed during the patient's care in the ED.  Some ED evaluations and interventions may be delayed as a result of limited staffing during the pandemic.*  Clinical Course as of Apr 18 1655  Wed Apr 17, 5756  4038 54 year old female with extensive past  medical history here with headache and multiple neurological symptoms. Her symptoms are actually more consistent with possible new paresthesias and neuropathy, which could be related to atypical migraine, versus underlying neuropathy from her lupus with CNS involvement. However, given her underlying disease burden as well as history of lupus involvement in the brain, will obtain MRI to evaluate for cerebritis or other complication. She is afebrile with no neck pain, neck stiffness, or symptoms to suggest an infectious, particularly bacterial, meningitis or encephalitis. The headache began gradually and is not consistent with subarachnoid clinically, and this can also be assessed on MRI.   [CI]    Clinical Course User Index [CI] Duffy Bruce, MD    Medical Decision Making:  As above. MRI negative for acute abnormality. DDx includes paresthesias in setting of possible autoimmune reaction to vaccine vs her underlying SLE, atypical cluster or migraine headache. No signs to suggest infection or bleed. Will have her f/u with neurologist.  ____________________________________________  FINAL CLINICAL IMPRESSION(S) / ED DIAGNOSES  Final diagnoses:  Acute nonintractable headache, unspecified headache type     MEDICATIONS GIVEN DURING THIS VISIT:  Medications  sodium chloride 0.9 % bolus 1,000 mL (0 mLs Intravenous Stopped 04/17/20 1341)  diphenhydrAMINE (BENADRYL) injection 25 mg (25 mg Intravenous Given 04/17/20 1109)  metoCLOPramide (REGLAN) injection 10 mg (10 mg Intravenous Given 04/17/20 1109)  ketorolac (TORADOL) 30 MG/ML injection 15 mg (15 mg Intravenous Given 04/17/20 1149)  fentaNYL (SUBLIMAZE) injection 50 mcg (50 mcg Intravenous Given 04/17/20 1149)  gadobutrol (GADAVIST) 1 MMOL/ML injection 9 mL (9 mLs Intravenous Contrast Given 04/17/20 1343)  fentaNYL (SUBLIMAZE) injection 50 mcg (50 mcg Intravenous Given 04/17/20 1410)     ED Discharge Orders    None       Note:  This document  was prepared using Dragon voice recognition software and may include unintentional dictation errors.   Duffy Bruce, MD 04/17/20 [phone]

## 2020-04-17 NOTE — ED Notes (Signed)
Discussed with dr Jimmye Norman, orders placed

## 2020-06-05 ENCOUNTER — Other Ambulatory Visit: Payer: Self-pay

## 2020-06-05 ENCOUNTER — Ambulatory Visit (INDEPENDENT_AMBULATORY_CARE_PROVIDER_SITE_OTHER): Payer: Medicare Other | Admitting: Internal Medicine

## 2020-06-05 ENCOUNTER — Encounter: Payer: Self-pay | Admitting: Internal Medicine

## 2020-06-05 VITALS — BP 124/70 | HR 88 | Temp 98.4°F | Ht 70.0 in | Wt 206.6 lb

## 2020-06-05 DIAGNOSIS — M21961 Unspecified acquired deformity of right lower leg: Secondary | ICD-10-CM | POA: Insufficient documentation

## 2020-06-05 DIAGNOSIS — M79672 Pain in left foot: Secondary | ICD-10-CM | POA: Diagnosis not present

## 2020-06-05 DIAGNOSIS — M79671 Pain in right foot: Secondary | ICD-10-CM | POA: Diagnosis not present

## 2020-06-05 DIAGNOSIS — M21962 Unspecified acquired deformity of left lower leg: Secondary | ICD-10-CM | POA: Diagnosis not present

## 2020-06-05 NOTE — Patient Instructions (Addendum)
Dr. Maryjean Morn clinic will call for appt   Left foot:  No acute fracture or dislocation.  Chronic posttraumatic deformity of the 3rd metatarsal shaft.  Claw toe deformities of the lesser toes.  Joint spaces are preserved.  No focal soft tissue swelling.   Right foot:  No acute fracture or dislocation.  Claw toe deformities of the second, 3rd and 4th toes.  Joint spaces are preserved.  No focal soft tissue swelling.

## 2020-06-05 NOTE — Progress Notes (Signed)
Chief Complaint  Patient presents with  . Foot Pain  . Referral   F/u with mom  B/l foot pain worse x 1 month 7/10 tried voltaren gel saw rheumatology and neurology and Xrays ordered with claw formation left 2-5th toes and right 3-5 toes and pt states this is source of pain and she has to wear size 12 show but still in pain wants referral to podiatry  She thinks she falls due to foot deformities   Review of Systems  Respiratory: Negative for shortness of breath.   Cardiovascular: Negative for chest pain.  Musculoskeletal: Positive for falls and joint pain.   Past Medical History:  Diagnosis Date  . ADHD   . Anemia   . ANXIETY 06/06/2007  . Arthritis    related to lupus  . Asthma   . BACK PAIN 02/22/2009  . Cervical cancer ([place])    LEEP 2005   . Chicken pox   . Chronic pain    goes to pain management provider  . Collagen vascular disease ([place])   . Connective tissue disease ([place])   . DEPRESSION 06/06/2007  . Essential tremor   . Fibromyalgia   . Fibromyalgia   . FREQUENCY, URINARY 07/07/2007  . Gastritis   . GERD 06/06/2007  . Glaucoma   . Headache    h/o migraines   . History of hiatal hernia   . History of kidney stones   . HYPOTHYROIDISM 06/06/2007  . INTERSTITIAL CYSTITIS 09/11/2010  . Lupus ([place]) 2006  . Lupus ([place])   . Menopause   . Migraine   . Mitral valve regurgitation   . NEPHROLITHIASIS, HX OF 11/05/2010  . Neuropathy   . Neuropathy   . OCD (obsessive compulsive disorder)   . Optic neuritis    2005  . OSTEOPENIA 10/14/2009  . Osteoporosis   . OVERACTIVE BLADDER 10/14/2009  . PAIN, CHRONIC NEC 06/06/2007  . Panniculitis   . Parkinson disease ([place]) 04/03/2019  . POLYARTHRITIS 08/19/2007  . Raynaud's disease   . Sicca syndrome ([place])   . 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 Vaginal prolapse    Dr. Marcelline Mates [facility]   . Vasculitis ([place])   . WEIGHT GAIN 02/22/2009   Past Surgical History:  Procedure Laterality Date  . APPENDECTOMY    . CERVICAL BIOPSY  W/  LOOP ELECTRODE EXCISION  2005  . CHOLECYSTECTOMY    . ESOPHAGOGASTRODUODENOSCOPY (EGD) WITH PROPOFOL N/A 08/27/2017   Procedure: ESOPHAGOGASTRODUODENOSCOPY (EGD) WITH PROPOFOL;  Surgeon: Manya Silvas, MD;  Location: [facility] ENDOSCOPY;  Service: Endoscopy;  Laterality: N/A;  . HAND SURGERY     repair of left metatarsal   . HARDWARE REMOVAL Right 02/16/2017   Procedure: HARDWARE REMOVAL FROM HIP;  Surgeon: Hessie Knows, MD;  Location: [facility] ORS;  Service: Orthopedics;  Laterality: Right;  . JOINT REPLACEMENT    . OTHER SURGICAL HISTORY     left 3rd metatarsal fracture repair 2011   . REVISION TOTAL HIP ARTHROPLASTY  06/2009   replacement 2010 then screw and plate removal 6301; right hip  . TONSILLECTOMY    . TONSILLECTOMY    . wrist  ligament repair bilateral    . WRIST RECONSTRUCTION    . WRIST SURGERY     laceration of right wrist ligaments  . wrist surgery     laceration of left wrist surgery    Family History  Problem Relation Age of Onset  . Hypertension Mother   . Arthritis Mother   . Heart disease Mother  afib  . Asthma Sister   . Asthma Daughter   . Arthritis Daughter   . Heart disease Daughter        ?heart condition on BB  . ADD / ADHD Daughter   . Pancreatic cancer Father   . Cancer Father        pancreatitic   . Diabetes Maternal Grandmother   . Heart disease Maternal Grandmother   . Arthritis Maternal Grandmother   . Hypertension Maternal Grandmother   . Colon cancer Maternal Uncle 33  . Crohn's disease Maternal Aunt   . Diabetes Maternal Aunt   . Breast cancer Cousin 90       maternal  . Cancer Cousin        m cousin breat cancer s/p removal both breasts    Social History   Socioeconomic History  . Marital status: Single    Spouse name: Not on file  . Number of children: 1  . Years of education: Not on file  . Highest education level: Not on file  Occupational History  . Occupation: DISABLED    Employer: UNEMPLOYED  Tobacco Use  . Smoking  status: Never Smoker  . Smokeless tobacco: Never Used  Vaping Use  . Vaping Use: Never used  Substance and Sexual Activity  . Alcohol use: No  . Drug use: No  . Sexual activity: Not Currently    Birth control/protection: None, Post-menopausal  Other Topics Concern  . 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 Difficulty of Paying Living Expenses: Not hard at all  Food Insecurity: No Food Insecurity  . Worried About Charity fundraiser in the Last Year: Never true  . Ran Out of Food in the Last Year: Never true  Transportation Needs: No Transportation Needs  . Lack of Transportation (Medical): No  . Lack of Transportation (Non-Medical): No  Physical Activity: Insufficiently Active  . Days of Exercise per Week: 7 days  . Minutes of Exercise per Session: 20 min  Stress: No Stress Concern Present  . Feeling of Stress : Only a little  Social Connections:   . Frequency of Communication with Friends and Family:   . Frequency of Social Gatherings with Friends and Family:   . Attends Religious Services:   . Active Member of Clubs or Organizations:   . Attends Archivist Meetings:   Marland Kitchen Marital Status:   Intimate Partner Violence:   . Fear of Current or Ex-Partner:   . Emotionally Abused:   Marland Kitchen Physically Abused:   . Sexually Abused:    Current Meds  Medication Sig  . acetaminophen (ACETAMINOPHEN 8 HOUR) 650 MG CR tablet Take 650 mg by mouth every 8 (eight) hours as needed for pain.  Marland Kitchen albuterol (VENTOLIN HFA) 108 (90 Base) MCG/ACT inhaler Inhale 1-2 puffs into the lungs every 4 (four) hours as needed for wheezing or shortness of breath.  . ARIPiprazole (ABILIFY) 20 MG tablet Take 20 mg by mouth daily.  . Ascorbic Acid (VITAMIN C) 500 MG CHEW Chew 2,500 mg by mouth daily.  Marland Kitchen  aspirin 325 MG tablet Take 1 tablet (325 mg total) by mouth daily.  . Biotin 10 MG CAPS Take by mouth.  . Biotin 5000 MCG SUBL Place 10,000  mcg under the tongue daily.  . calcipotriene (DOVONOX) 0.005 % ointment calcipotriene 0.005 % topical ointment  APPLY A THIN LAYER TO THE AFFECTED AREA(S) BY TOPICAL ROUTE ONCE DAILY ; RUB IN GENTLY AND COMPLETELY  . carbidopa-levodopa (SINEMET IR) 10-100 MG tablet Take 0.5 tablets by mouth 3 (three) times daily.  . Cholecalciferol (VITAMIN D3) 2000 units capsule Vitamin D3 2,000 unit capsule  . dapsone 25 MG tablet Take by mouth daily.   Marland Kitchen dexmethylphenidate (FOCALIN XR) 20 MG 24 hr capsule dexmethylphenidate ER 20 mg capsule,extended release biphasic50-50  . diclofenac sodium (VOLTAREN) 1 % GEL Apply 2 g topically 4 (four) times daily as needed (for knee & finger pain.).   Marland Kitchen dicyclomine (BENTYL) 10 MG capsule dicyclomine 10 mg capsule  every 8hours as needed  . DULoxetine (CYMBALTA) 60 MG capsule Take 60 mg by mouth 2 (two) times daily.  Regino Schultze Bandages & Supports (MEDICAL COMPRESSION STOCKINGS) MISC Please provide 19mmHg compression stockings  . fentaNYL (DURAGESIC - DOSED MCG/HR) 75 MCG/HR Place 75 mcg onto the skin every 3 (three) days.  . Ferrous Sulfate (IRON PO) Take by mouth.  . fluocinonide ointment (LIDEX) 0.05 % fluocinonide 0.05 % topical ointment  APPLY TO THE AFFECTED AREA(S) BY TOPICAL ROUTE 2 TIMES PER DAY  . Fluticasone-Salmeterol (ADVAIR DISKUS) 250-50 MCG/DOSE AEPB Inhale 1 puff into the lungs 2 (two) times daily. Rinse mouth  . folic acid (FOLVITE) 1 MG tablet Take 1 tablet (1 mg total) by mouth daily.  . hydrocortisone (PROCTOZONE-HC) 2.5 % rectal cream Proctozone-HC 2.5 % topical cream perineal applicator  . hydroxychloroquine (PLAQUENIL) 200 MG tablet Plaquenil 200 mg tablet  Take 1 tablet twice a day by oral route after meals for 90 days.  Marland Kitchen ibuprofen (ADVIL,MOTRIN) 800 MG tablet Take 1 tablet (800 mg total) by mouth every 8  (eight) hours as needed.  . imiquimod (ALDARA) 5 % cream imiquimod 5 % topical cream packet  APPLY TO THE AFFECTED AREA(S) BY TOPICAL ROUTE 3 TIMES PER WEEK at bedtime  . isosorbide mononitrate (IMDUR) 60 MG 24 hr tablet Take 1 tablet (60 mg total) by mouth daily.  Marland Kitchen levothyroxine (SYNTHROID) 75 MCG tablet Take 1 tablet (75 mcg total) by mouth daily before breakfast. 30 minutes before food  . LORazepam (ATIVAN) 2 MG tablet Take 1-2 mg by mouth See admin instructions. 1 MG DAILY AS NEEDED FOR ANXIETY & SCHEDULED AT BEDTIME EACH NIGHT  . methotrexate 250 MG/10ML injection Inject into the skin once a week.   . methylphenidate 18 MG PO CR tablet Take by mouth.   . montelukast (SINGULAIR) 10 MG tablet montelukast 10 mg tablet 1 pill qhs  . Multiple Vitamins-Minerals (MULTI-VITAMIN GUMMIES PO) Multi Vitamin  GUMMIES  . omeprazole (PRILOSEC) 40 MG capsule Take 40 mg by mouth 2 (two) times daily.  . ondansetron (ZOFRAN) 4 MG tablet Zofran 8 mg tablet  Take 1 tablet every 8 hours by oral route as needed.  Marland Kitchen PARoxetine (PAXIL) 10 MG tablet Take 1 tablet (10 mg total) by mouth at bedtime.  . polyethylene glycol powder (GLYCOLAX/MIRALAX) powder Take 17 g by mouth daily.  . Polyvinyl Alcohol-Povidone (REFRESH OP) Place 1 drop into both eyes 2 (two) times daily.  . predniSONE (DELTASONE) 10 MG tablet prednisone 10 mg tablet  1 daily  . Probiotic Product (PROBIOTIC PO) Probiotic 10 billion cell capsule  daily  . silver sulfADIAZINE (SILVADENE) 1 % cream silver sulfadiazine 1 % topical cream  . Sodium Chloride 3 % AERS  Saline Nasal Mist 3 %  spray nose deeply every hour or two and blow nose  . sucralfate (CARAFATE) 1 g tablet sucralfate 1 gram tablet  . terbinafine (LAMISIL AT) 1 % cream Apply 1 application topically 2 (two) times daily.  Marland Kitchen tiZANidine (ZANAFLEX) 4 MG capsule Take by mouth.  . topiramate (TOPAMAX) 100 MG tablet 100-150 mg. 100 in am and 150 qhs  . triamcinolone ointment (KENALOG) 0.5 %  Apply 1 application topically daily.  . valACYclovir (VALTREX) 500 MG tablet valacyclovir 500 mg tablet  1 daily   Allergies  Allergen Reactions  . Bee Venom Itching and Swelling    Affected area Affected area Affected area Affected area   . Fish Allergy Hives, Swelling and Rash    Facial swelling  Facial swelling  Facial swelling Facial swelling  Facial swelling   . Latex Anaphylaxis, Rash and Shortness Of Breath    Rash  Rash    . Prasterone Other (See Comments) and Nausea And Vomiting    rash Other reaction(s): Headache Headaches.  . Shellfish Allergy Hives, Other (See Comments), Rash and Swelling    Facial swelling Uncoded Allergy. Allergen: seafood Uncoded Allergy. Allergen: CATS, Other Reaction: itch, wheezing Uncoded Allergy. Allergen: COMPAZINE, Other Reaction: tremors Facial swelling Facial swelling Uncoded Allergy. Allergen: seafood Uncoded Allergy. Allergen: CATS, Other Reaction: itch, wheezing Uncoded Allergy. Allergen: COMPAZINE, Other Reaction: tremors Facial swelling Uncoded Allergy. Allergen: seafood Uncoded Allergy. Allergen: CATS, Other Reaction: itch, wheezing Uncoded Allergy. Allergen: COMPAZINE, Other Reaction: tremors Facial swelling Facial swelling Uncoded Allergy. Allergen: seafood Uncoded Allergy. Allergen: CATS, Other Reaction: itch, wheezing Uncoded Allergy. Allergen: COMPAZINE, Other Reaction: tremors Facial swelling Facial swelling Uncoded Allergy. Allergen: seafood Uncoded Allergy. Allergen: CATS, Other Reaction: itch, wheezing Uncoded Allergy. Allergen: COMPAZINE, Other Reaction: tremors   . Shellfish-Derived Products Hives, Other (See Comments), Rash and Swelling    Facial swelling Uncoded Allergy. Allergen: seafood Uncoded Allergy. Allergen: CATS, Other Reaction: itch, wheezing Uncoded Allergy. Allergen: COMPAZINE, Other Reaction: tremors Facial swelling Facial swelling Uncoded Allergy. Allergen: seafood Uncoded Allergy.  Allergen: CATS, Other Reaction: itch, wheezing Uncoded Allergy. Allergen: COMPAZINE, Other Reaction: tremors Facial swelling Uncoded Allergy. Allergen: seafood Uncoded Allergy. Allergen: CATS, Other Reaction: itch, wheezing Uncoded Allergy. Allergen: COMPAZINE, Other Reaction: tremors Facial swelling  . Compazine [Prochlorperazine Edisylate] Other (See Comments)    tremors  . Dhea [Nutritional Supplements] Other (See Comments)    Headaches.  . Dilaudid [Hydromorphone Hcl]     ? reaction  . Famotidine Other (See Comments) and Diarrhea    Other reaction(s): Headache Gas  . Lactose   . Lactose Intolerance (Gi)   . Other Other (See Comments)    Other reaction(s): Unknown   . Prochlorperazine     Other reaction(s): Other (See Comments) ticks  . Promethazine     Other reaction(s): Other (See Comments) Ticks  . Promethazine Hcl Other (See Comments)    CNS disorder  . Reclast [Zoledronic Acid]     Weakness could not move limbs, fatigue, increase sleep  . Clarithromycin Hives and Rash  . Clotrimazole Swelling and Rash  . Hydromorphone Hcl Rash and Hives    "Rash all over"  . Penicillins Rash and Other (See Comments)    Has patient had a PCN reaction causing immediate rash, facial/tongue/throat swelling, SOB or lightheadedness with hypotension:No Has patient had a PCN reaction causing severe rash involving mucus membranes or skin necrosis:No Has patient had a PCN reaction that required hospitalization:No Has patient had a PCN reaction occurring within  the last 10 years:No If all of the above answers are "NO", then may proceed with Cephalosporin use.   Recent Results (from the past 2160 hour(s))  TSH     Status: Abnormal   Collection Time: 04/02/20  3:20 PM  Result Value Ref Range   TSH 6.67 (H) 0.35 - 4.50 uIU/mL  Basic Metabolic Panel (BMET)     Status: Abnormal   Collection Time: 04/02/20  3:20 PM  Result Value Ref Range   Sodium 141 135 - 145 mEq/L   Potassium 3.6 3.5 -  5.1 mEq/L   Chloride 107 96 - 112 mEq/L   CO2 28 19 - 32 mEq/L   Glucose, Bld 80 70 - 99 mg/dL   BUN 13 6 - 23 mg/dL   Creatinine, Ser 1.01 0.40 - 1.20 mg/dL   GFR 56.81 (L) >60.00 mL/min   Calcium 8.7 8.4 - 10.5 mg/dL  Glucose, capillary     Status: None   Collection Time: 04/17/20  7:21 AM  Result Value Ref Range   Glucose-Capillary 86 70 - 99 mg/dL    Comment: Glucose reference range applies only to samples taken after fasting for at least 8 hours.   Comment 1 Notify RN   CBC with Differential     Status: Abnormal   Collection Time: 04/17/20  7:24 AM  Result Value Ref Range   WBC 3.3 (L) 4.0 - 10.5 K/uL   RBC 4.53 3.87 - 5.11 MIL/uL   Hemoglobin 12.7 12.0 - 15.0 g/dL   HCT 40.8 36 - 46 %   MCV 90.1 80.0 - 100.0 fL   MCH 28.0 26.0 - 34.0 pg   MCHC 31.1 30.0 - 36.0 g/dL   RDW 12.9 11.5 - 15.5 %   Platelets 153 150 - 400 K/uL   nRBC 0.0 0.0 - 0.2 %   Neutrophils Relative % 33 %   Neutro Abs 1.1 (L) 1.7 - 7.7 K/uL   Lymphocytes Relative 56 %   Lymphs Abs 1.9 0.7 - 4.0 K/uL   Monocytes Relative 6 %   Monocytes Absolute 0.2 0 - 1 K/uL   Eosinophils Relative 4 %   Eosinophils Absolute 0.1 0 - 0 K/uL   Basophils Relative 1 %   Basophils Absolute 0.0 0 - 0 K/uL   Immature Granulocytes 0 %   Abs Immature Granulocytes 0.00 0.00 - 0.07 K/uL    Comment: Performed at [facility], [address] [place], [place] [address]  Comprehensive metabolic panel     Status: Abnormal   Collection Time: 04/17/20  7:24 AM  Result Value Ref Range   Sodium 140 135 - 145 mmol/L   Potassium 3.5 3.5 - 5.1 mmol/L   Chloride 105 98 - 111 mmol/L   CO2 27 22 - 32 mmol/L   Glucose, Bld 102 (H) 70 - 99 mg/dL    Comment: Glucose reference range applies only to samples taken after fasting for at least 8 hours.   BUN 17 6 - 20 mg/dL   Creatinine, Ser 0.68 0.44 - 1.00 mg/dL   Calcium 8.8 (L) 8.9 - 10.3 mg/dL   Total Protein 7.2 6.5 - 8.1 g/dL   Albumin 4.5 3.5 - 5.0 g/dL   AST 20 15 - 41  U/L   ALT 19 0 - 44 U/L   Alkaline Phosphatase 87 38 - 126 U/L   Total Bilirubin 0.6 0.3 - 1.2 mg/dL   GFR calc non Af Amer >60 >60 mL/min   GFR calc  Af Amer >60 >60 mL/min   Anion gap 8 5 - 15    Comment: Performed at [facility], [address] [place], [place] [address]  Sedimentation rate     Status: None   Collection Time: 04/17/20 11:01 AM  Result Value Ref Range   Sed Rate 5 0 - 30 mm/hr    Comment: Performed at [facility], [address] [place], [place] [address]  C-reactive protein     Status: None   Collection Time: 04/17/20 11:01 AM  Result Value Ref Range   CRP 0.6 <1.0 mg/dL    Comment: Performed at [facility], [place] [address] [place], [place] [address]   Objective  Body mass index is 29.64 kg/m. Wt Readings from Last 3 Encounters:  06/05/20 206 lb 9.6 oz (93.7 kg)  04/17/20 206 lb (93.4 kg)  04/02/20 208 lb 12.8 oz (94.7 kg)   Temp Readings from Last 3 Encounters:  06/05/20 98.4 F (36.9 C) (Oral)  04/17/20 98.2 F (36.8 C) (Oral)  04/02/20 (!) 96.3 F (35.7 C) (Temporal)   BP Readings from Last 3 Encounters:  06/05/20 124/70  04/17/20 124/80  04/02/20 124/78   Pulse Readings from Last 3 Encounters:  06/05/20 88  04/17/20 86  04/02/20 94    Physical Exam Vitals and nursing note reviewed.  Constitutional:      Appearance: Normal appearance. She is well-developed and well-groomed. She is obese.  HENT:     Head: Normocephalic and atraumatic.  Eyes:     Conjunctiva/sclera: Conjunctivae normal.     Pupils: Pupils are equal, round, and reactive to light.  Cardiovascular:     Rate and Rhythm: Normal rate and regular rhythm.     Heart sounds: Normal heart sounds. No murmur heard.   Pulmonary:     Effort: Pulmonary effort is normal.     Breath sounds: Normal breath sounds.  Feet:     Comments: Claw deformity b/l feet L>R discoloration to feet bl Skin:    General: Skin is warm and dry.  Neurological:     General:  No focal deficit present.     Mental Status: She is alert and oriented to person, place, and time. Mental status is at baseline.     Gait: Gait normal.     Comments: BL walks with cane  Psychiatric:        Attention and Perception: Attention and perception normal.        Mood and Affect: Mood and affect normal.        Speech: Speech normal.        Behavior: Behavior normal. Behavior is cooperative.        Thought Content: Thought content normal.        Cognition and Memory: Cognition and memory normal.        Judgment: Judgment normal.     Assessment  Plan  Bilateral foot pain - Plan: Ambulatory referral to Podiatry  Dr. Cleda Mccreedy [facility] 05/06/20 Xrays Left foot:  No acute fracture or dislocation.  Chronic posttraumatic deformity of the 3rd metatarsal shaft.  Claw toe deformities of the lesser toes.  Joint spaces are preserved.  No focal soft tissue swelling.   Right foot:  No acute fracture or dislocation.  Claw toe deformities of the second, 3rd and 4th toes.  Joint spaces are preserved.  No focal soft tissue swelling.      Provider: Dr. Olivia Mackie McLean-Scocuzza-Internal Medicine

## 2020-06-20 ENCOUNTER — Telehealth: Payer: Self-pay | Admitting: Internal Medicine

## 2020-06-20 NOTE — Telephone Encounter (Signed)
"  CALLED AGAIN, NO VOICEMAIL SET UP 7/22" [facility] said about 2 hours ago  "Rejection Reason - Patient did not respond - CALLED TWICE, NO VOICEMAIL SET UP" [facility] said about 2 hours ago

## 2020-06-20 NOTE — Telephone Encounter (Signed)
Call pt podiatry referral rejected b/c they could not get her on the phone  Inform  If needed she will need to let us know when ready for another referral  MTS

## 2020-07-01 ENCOUNTER — Other Ambulatory Visit: Payer: Self-pay | Admitting: Orthopedic Surgery

## 2020-07-01 DIAGNOSIS — M87051 Idiopathic aseptic necrosis of right femur: Secondary | ICD-10-CM

## 2020-07-04 ENCOUNTER — Encounter: Payer: Medicare Other | Admitting: Obstetrics and Gynecology

## 2020-07-14 ENCOUNTER — Ambulatory Visit: Admission: RE | Admit: 2020-07-14 | Payer: Medicare Other | Source: Ambulatory Visit

## 2020-07-16 ENCOUNTER — Encounter: Payer: Self-pay | Admitting: Internal Medicine

## 2020-07-17 ENCOUNTER — Other Ambulatory Visit: Payer: Self-pay

## 2020-07-17 ENCOUNTER — Ambulatory Visit
Admission: RE | Admit: 2020-07-17 | Discharge: 2020-07-17 | Disposition: A | Discharge status: Home / Self Care | Payer: Medicare Other | Source: Ambulatory Visit | Attending: Orthopedic Surgery | Admitting: Orthopedic Surgery

## 2020-07-17 DIAGNOSIS — M87051 Idiopathic aseptic necrosis of right femur: Secondary | ICD-10-CM | POA: Diagnosis not present

## 2020-07-27 ENCOUNTER — Other Ambulatory Visit: Payer: Medicare Other

## 2020-07-29 ENCOUNTER — Other Ambulatory Visit: Payer: Self-pay | Admitting: Obstetrics and Gynecology

## 2020-07-29 DIAGNOSIS — N951 Menopausal and female climacteric states: Secondary | ICD-10-CM

## 2020-08-08 ENCOUNTER — Other Ambulatory Visit: Payer: Self-pay | Admitting: Student

## 2020-08-08 DIAGNOSIS — R1013 Epigastric pain: Secondary | ICD-10-CM

## 2020-08-08 DIAGNOSIS — G8929 Other chronic pain: Secondary | ICD-10-CM

## 2020-08-08 DIAGNOSIS — R110 Nausea: Secondary | ICD-10-CM

## 2020-08-08 DIAGNOSIS — R140 Abdominal distension (gaseous): Secondary | ICD-10-CM

## 2020-09-03 ENCOUNTER — Ambulatory Visit: Payer: Medicare Other | Admitting: Internal Medicine

## 2020-09-04 ENCOUNTER — Other Ambulatory Visit: Payer: Self-pay

## 2020-09-06 ENCOUNTER — Ambulatory Visit (INDEPENDENT_AMBULATORY_CARE_PROVIDER_SITE_OTHER): Payer: Medicare Other

## 2020-09-06 ENCOUNTER — Encounter: Payer: Self-pay | Admitting: Internal Medicine

## 2020-09-06 ENCOUNTER — Other Ambulatory Visit: Payer: Self-pay

## 2020-09-06 ENCOUNTER — Ambulatory Visit (INDEPENDENT_AMBULATORY_CARE_PROVIDER_SITE_OTHER): Payer: Medicare Other | Admitting: Internal Medicine

## 2020-09-06 VITALS — BP 130/80 | HR 91 | Temp 98.2°F | Ht 70.0 in | Wt 206.0 lb

## 2020-09-06 DIAGNOSIS — R1030 Lower abdominal pain, unspecified: Secondary | ICD-10-CM

## 2020-09-06 DIAGNOSIS — S76011D Strain of muscle, fascia and tendon of right hip, subsequent encounter: Secondary | ICD-10-CM | POA: Diagnosis not present

## 2020-09-06 DIAGNOSIS — R112 Nausea with vomiting, unspecified: Secondary | ICD-10-CM

## 2020-09-06 DIAGNOSIS — M359 Systemic involvement of connective tissue, unspecified: Secondary | ICD-10-CM

## 2020-09-06 DIAGNOSIS — E785 Hyperlipidemia, unspecified: Secondary | ICD-10-CM

## 2020-09-06 DIAGNOSIS — M797 Fibromyalgia: Secondary | ICD-10-CM

## 2020-09-06 DIAGNOSIS — G8929 Other chronic pain: Secondary | ICD-10-CM

## 2020-09-06 DIAGNOSIS — K219 Gastro-esophageal reflux disease without esophagitis: Secondary | ICD-10-CM | POA: Diagnosis not present

## 2020-09-06 DIAGNOSIS — M3500 Sicca syndrome, unspecified: Secondary | ICD-10-CM

## 2020-09-06 DIAGNOSIS — Z13220 Encounter for screening for lipoid disorders: Secondary | ICD-10-CM

## 2020-09-06 DIAGNOSIS — K5909 Other constipation: Secondary | ICD-10-CM

## 2020-09-06 DIAGNOSIS — J309 Allergic rhinitis, unspecified: Secondary | ICD-10-CM

## 2020-09-06 DIAGNOSIS — M329 Systemic lupus erythematosus, unspecified: Secondary | ICD-10-CM

## 2020-09-06 DIAGNOSIS — D696 Thrombocytopenia, unspecified: Secondary | ICD-10-CM

## 2020-09-06 LAB — URINALYSIS, ROUTINE W REFLEX MICROSCOPIC
Bilirubin Urine: NEGATIVE
Hgb urine dipstick: NEGATIVE
Leukocytes,Ua: NEGATIVE
Nitrite: NEGATIVE
RBC / HPF: NONE SEEN (ref 0–?)
Specific Gravity, Urine: >=1.03 — AB (ref 1.000–1.030)
Total Protein, Urine: NEGATIVE
Urine Glucose: NEGATIVE
Urobilinogen, UA: 0.2 (ref 0.0–1.0)
pH: 6 (ref 5.0–8.0)

## 2020-09-06 MED ORDER — ONDANSETRON HCL 4 MG PO TABS: 4.0000 mg | ORAL_TABLET | Freq: Three times a day (TID) | ORAL | 1 refills | Status: DC | PRN

## 2020-09-06 MED ORDER — SUCRALFATE 1 G PO TABS: 1.0000 g | ORAL_TABLET | Freq: Three times a day (TID) | ORAL | 3 refills | Status: DC

## 2020-09-06 NOTE — Patient Instructions (Addendum)
Consider pepcid at night over the counter   Hamstring Strain Rehab Ask your health care provider which exercises are safe for you. Do exercises exactly as told by your health care provider and adjust them as directed. It is normal to feel mild stretching, pulling, tightness, or discomfort as you do these exercises. Stop right away if you feel sudden pain or your pain gets worse. Do not begin these exercises until told by your health care provider. Stretching and range-of-motion exercises These exercises warm up your muscles and joints and improve the movement and flexibility of your thighs. These exercises also help to relieve pain, numbness, and tingling. Talk to your health care provider about these restrictions. Knee extension, seated  1. Sit with your left / right heel propped on a chair, a coffee table, or a footstool. Do not have anything under your knee to support it. 2. Allow your leg muscles to relax, letting gravity straighten out your knee (extension). You should feel a stretch behind your left / right knee. 3. If told by your health care provider, deepen the stretch by placing a __________ weight on your thigh, just above your kneecap. 4. Hold this position for __________ seconds. Repeat __________ times. Complete this exercise __________ times a day. Seated stretch This exercise is sometimes called hamstrings and adductors stretch. 1. Sit on the floor with your legs stretched wide. Keep your knees straight during this exercise. 2. Keeping your head and back in a straight line, bend at your waist to reach for your left foot (position A). You should feel a stretch in your right inner thigh (adductors). 3. Hold this position for __________ seconds. Then slowly return to the upright position. 4. Keeping your head and back in a straight line, bend at your waist to reach forward (position B). You should feel a stretch behind both of your thighs or knees (hamstrings). 5. Hold this position  for __________ seconds. Then slowly return to the upright position. 6. Keeping your head and back in a straight line, bend at your waist to reach for your right foot (position C). You should feel a stretch in your left inner thigh (adductors). 7. Hold this position for __________ seconds. Then slowly return to the upright position. Repeat __________ times. Complete this exercise __________ times a day. Hamstrings stretch, supine  1. Lie on your back (supine position). 2. Loop a belt or towel over the ball of your left / right foot. The ball of your foot is on the walking surface, right under your toes. 3. Straighten your left / right knee and slowly pull on the belt or towel to raise your leg. ? Do not let your left / right knee bend while you do this. ? Keep your other leg flat on the floor. ? Raise the left / right leg until you feel a gentle stretch behind your left / right knee or thigh (hamstrings). 4. Hold this position for __________ seconds. 5. Slowly return your leg to the starting position. Repeat __________ times. Complete this exercise __________ times a day. Strengthening exercises These exercises build strength and endurance in your thighs. Endurance is the ability to use your muscles for a long time, even after they get tired. Straight leg raises, prone This exercise strengthens the muscles that move the hips (hip extensors). 1. Lie on your abdomen on a firm surface (prone position). 2. Tense the muscles in your buttocks and lift your left / right leg about 4 inches (10 cm). Keep your  knee straight as you lift your leg. If you cannot lift your leg that high without arching your back, place a pillow under your hips. 3. Hold the position for __________ seconds. 4. Slowly lower your leg to the starting position. 5. Allow your muscles to relax completely before you start the next repetition. Repeat __________ times. Complete this exercise __________ times a day. Bridge This  exercise strengthens the muscles in your buttocks and the back of your thighs (hip extensors). 1. Lie on your back on a firm surface with your knees bent and your feet flat on the floor. 2. Tighten your buttocks muscles and lift your bottom off the floor until the trunk of your body is level with your thighs. ? You should feel the muscles working in your buttocks and the back of your thighs. ? Do not arch your back. 3. Hold this position for __________ seconds. 4. Slowly lower your hips to the starting position. 5. Let your buttocks muscles relax completely between repetitions. 6. If told by your health care provider, keep your bottom lifted off the floor while you slowly walk your feet away from you as far as you can control. Hold for __________ seconds, then slowly walk your feet back toward you. Repeat __________ times. Complete this exercise __________ times a day. Lateral walking with band This is an exercise in which you walk sideways (lateral), with tension provided by an exercise band. The exercise strengthens the muscles in your hip (hip abductors). 1. Stand in a long hallway. 2. Wrap a loop of exercise band around your legs, just above your knees. 3. Bend your knees gently and drop your hips down and back so your weight is over your heels. 4. Step to the side to move down the length of the hallway, keeping your toes pointed ahead of you and keeping tension in the band. 5. Repeat, leading with your other leg. Repeat __________ times. Complete this exercise __________ times a day. Single leg stand with reaching This exercise is also called eccentric hamstring stretch. 1. Stand on your left / right foot. Keep your big toe down on the floor and try to keep your arch lifted. 2. Slowly reach down toward the floor as far as you can while keeping your balance. Lowering your thigh under tension is called eccentric stretching. 3. Hold this position for __________ seconds. Repeat __________  times. Complete this exercise __________ times a day. Plank, prone This exercise strengthens muscles in your abdomen and core area. 1. Lie on your abdomen on the floor (prone position),and prop yourself up on your elbows. Your hands should be straight out in front of you, and your elbows should be below your shoulders. Position your feet similar to a push-up position so your toes are on the ground. 2. Tighten your abdominal muscles and lift your body off the floor. ? Do not arch your back. ? Do not hold your breath. 3. Hold this position for __________ seconds. Repeat __________ times. Complete this exercise __________ times a day. This information is not intended to replace advice given to you by your health care provider. Make sure you discuss any questions you have with your health care provider. Document Revised: 03/09/2019 Document Reviewed: 11/14/2018 Else[employer] Patient Education  [place].  Sciatica Rehab Ask your health care provider which exercises are safe for you. Do exercises exactly as told by your health care provider and adjust them as directed. It is normal to feel mild stretching, pulling, tightness, or discomfort  as you do these exercises. Stop right away if you feel sudden pain or your pain gets worse. Do not begin these exercises until told by your health care provider. Stretching and range-of-motion exercises These exercises warm up your muscles and joints and improve the movement and flexibility of your hips and back. These exercises also help to relieve pain, numbness, and tingling. Sciatic nerve glide 1. Sit in a chair with your head facing down toward your chest. Place your hands behind your back. Let your shoulders slump forward. 2. Slowly straighten one of your legs while you tilt your head back as if you are looking toward the ceiling. Only straighten your leg as far as you can without making your symptoms worse. 3. Hold this position for __________  seconds. 4. Slowly return to the starting position. 5. Repeat with your other leg. Repeat __________ times. Complete this exercise __________ times a day. Knee to chest with hip adduction and internal rotation  1. Lie on your back on a firm surface with both legs straight. 2. Bend one of your knees and move it up toward your chest until you feel a gentle stretch in your lower back and buttock. Then, move your knee toward the shoulder that is on the opposite side from your leg. This is hip adduction and internal rotation. ? Hold your leg in this position by holding on to the front of your knee. 3. Hold this position for __________ seconds. 4. Slowly return to the starting position. 5. Repeat with your other leg. Repeat __________ times. Complete this exercise __________ times a day. Prone extension on elbows  1. Lie on your abdomen on a firm surface. A bed may be too soft for this exercise. 2. Prop yourself up on your elbows. 3. Use your arms to help lift your chest up until you feel a gentle stretch in your abdomen and your lower back. ? This will place some of your body weight on your elbows. If this is uncomfortable, try stacking pillows under your chest. ? Your hips should stay down, against the surface that you are lying on. Keep your hip and back muscles relaxed. 4. Hold this position for __________ seconds. 5. Slowly relax your upper body and return to the starting position. Repeat __________ times. Complete this exercise __________ times a day. Strengthening exercises These exercises build strength and endurance in your back. Endurance is the ability to use your muscles for a long time, even after they get tired. Pelvic tilt This exercise strengthens the muscles that lie deep in the abdomen. 1. Lie on your back on a firm surface. Bend your knees and keep your feet flat on the floor. 2. Tense your abdominal muscles. Tip your pelvis up toward the ceiling and flatten your lower back  into the floor. ? To help with this exercise, you may place a small towel under your lower back and try to push your back into the towel. 3. Hold this position for __________ seconds. 4. Let your muscles relax completely before you repeat this exercise. Repeat __________ times. Complete this exercise __________ times a day. Alternating arm and leg raises  1. Get on your hands and knees on a firm surface. If you are on a hard floor, you may want to use padding, such as an exercise mat, to cushion your knees. 2. Line up your arms and legs. Your hands should be directly below your shoulders, and your knees should be directly below your hips. 3. Lift your left  leg behind you. At the same time, raise your right arm and straighten it in front of you. ? Do not lift your leg higher than your hip. ? Do not lift your arm higher than your shoulder. ? Keep your abdominal and back muscles tight. ? Keep your hips facing the ground. ? Do not arch your back. ? Keep your balance carefully, and do not hold your breath. 4. Hold this position for __________ seconds. 5. Slowly return to the starting position. 6. Repeat with your right leg and your left arm. Repeat __________ times. Complete this exercise __________ times a day. Posture and body mechanics Good posture and healthy body mechanics can help to relieve stress in your body's tissues and joints. Body mechanics refers to the movements and positions of your body while you do your daily activities. Posture is part of body mechanics. Good posture means:  Your spine is in its natural S-curve position (neutral).  Your shoulders are pulled back slightly.  Your head is not tipped forward. Follow these guidelines to improve your posture and body mechanics in your everyday activities. Standing   When standing, keep your spine neutral and your feet about hip width apart. Keep a slight bend in your knees. Your ears, shoulders, and hips should line  up.  When you do a task in which you stand in one place for a long time, place one foot up on a stable object that is 2-4 inches (5-10 cm) high, such as a footstool. This helps keep your spine neutral. Sitting   When sitting, keep your spine neutral and keep your feet flat on the floor. Use a footrest, if necessary, and keep your thighs parallel to the floor. Avoid rounding your shoulders, and avoid tilting your head forward.  When working at a desk or a computer, keep your desk at a height where your hands are slightly lower than your elbows. Slide your chair under your desk so you are close enough to maintain good posture.  When working at a computer, place your monitor at a height where you are looking straight ahead and you do not have to tilt your head forward or downward to look at the screen. Resting  When lying down and resting, avoid positions that are most painful for you.  If you have pain with activities such as sitting, bending, stooping, or squatting, lie in a position in which your body does not bend very much. For example, avoid curling up on your side with your arms and knees near your chest (fetal position).  If you have pain with activities such as standing for a long time or reaching with your arms, lie with your spine in a neutral position and bend your knees slightly. Try the following positions: ? Lying on your side with a pillow between your knees. ? Lying on your back with a pillow under your knees. Lifting   When lifting objects, keep your feet at least shoulder width apart and tighten your abdominal muscles.  Bend your knees and hips and keep your spine neutral. It is important to lift using the strength of your legs, not your back. Do not lock your knees straight out.  Always ask for help to lift heavy or awkward objects. This information is not intended to replace advice given to you by your health care provider. Make sure you discuss any questions you have  with your health care provider. Document Revised: 03/10/2019 Document Reviewed: 12/08/2018 Elsevier Patient Education  [place].  Gluteus Medius Syndrome Rehab Ask your health care provider which exercises are safe for you. Do exercises exactly as told by your health care provider and adjust them as directed. It is normal to feel mild stretching, pulling, tightness, or discomfort as you do these exercises. Stop right away if you feel sudden pain or your pain gets worse. Do not begin these exercises until told by your health care provider. Stretching and range-of-motion exercise This exercise warms up your muscles and joints and improves the movement and flexibility of your hip and pelvis. This exercise also helps to relieve muscle and joint pain and stiffness. Lunge This exercise is also called hip flexor stretch. 6. Kneel on the floor on your left / right knee. Bend your other knee so it is directly over your ankle. 7. Keep good posture with your head over your shoulders. Tuck your tailbone underneath you. This will prevent your back from arching too much. 8. You should feel a gentle stretch in the front of your back thigh or hip (hip flexors). If you do not feel a stretch, slowly lunge forward with your chest up. 9. Hold this position for __________ seconds. 10. Slowly return to the starting position. Repeat __________ times. Complete this exercise __________ times a day. Strengthening exercises These exercises build strength and endurance in your hip and pelvis. Endurance is the ability to use your muscles for a long time, even after they get tired. Bridge This exercise strengthens the muscles that move your thigh backward (hip extensors). 6. Lie on your back on a firm surface with your knees bent and your feet flat on the floor. 7. Tighten your buttocks muscles and lift your bottom off the floor until the trunk of your body is level with your thighs. ? You should feel the  muscles working in your buttocks and the back of your thighs. If this exercise is too easy, cross your arms over your chest or lift one leg while your bottom is up and off the floor. ? Do not arch your back. 8. Hold this position for __________ seconds. 9. Slowly lower your hips to the starting position. 10. Let your muscles relax completely after each repetition. Repeat __________ times. Complete this exercise __________ times a day. Straight leg raises, side-lying This exercise strengthens the muscles that rotate the leg at the hip and move it away from your body (hip abductors). 6. Lie on your side with your left / right leg in the top position. Lie so your head, shoulder, knee, and hip line up. Bend your bottom knee slightly to help you balance. 7. Lift your top leg 4-6 inches (10-15 cm) while keeping your toes pointed straight ahead. 8. Hold this position for __________ seconds. 9. Slowly lower your leg to the starting position. 10. Let your muscles relax completely after each repetition. Repeat __________ times. Complete this exercise __________ times a day. Hip abduction, quadruped This is an exercise in which your hands and knees are on the floor, and you lift one knee out to the side. 5. Get on your hands and knees on a firm, lightly padded surface. Your hands should be directly below your shoulders, and your knees should be directly below your hips. 6. Lift your left / right knee out to the side. Keep your knee bent. Do not twist your body. 7. Hold this position for __________ seconds. 8. Slowly lower your leg back to the starting position. Repeat __________ times. Complete this exercise __________ times a day. Single  leg stand 7. Stand near a counter or door frame. Hold on to it as needed. It is helpful to look in a mirror for this exercise so you can watch your hip. 8. Squeeze your left / right buttocks muscles, then lift up your other foot. Do not let your left / right hip push  out to the side. 9. Hold this position for __________ seconds. Repeat __________ times. Complete this exercise __________ times a day. This information is not intended to replace advice given to you by your health care provider. Make sure you discuss any questions you have with your health care provider. Document Revised: 03/09/2019 Document Reviewed: 09/14/2018 Elsevier Patient Education  [place].

## 2020-09-06 NOTE — Progress Notes (Addendum)
Chief Complaint  Patient presents with  . Follow-up  . Abdominal Pain  . Leg Problem   F/u with daughter  1. Ab pain generalized with n/v since last Saturday 9/10 pain. She called GI to have an appt but they deferred to PCP. She also feels like having abdominal spasms she on on several medications I.e chronic pain meds which could cause constipation or GI related sx's I.e sinemet, fentanyl.  2. Fall 06/2020 mechanical tripped over something at home with since Right leg pain 9/10 s/p fall with abnormal MRI saw ortho Dr. Rudene Christians cant afford PT so given stretches to do and she is also having lower back spasms right>left. Sitting down makes pain worse  07/17/20 MRI right hip  IMPRESSION: Right gluteus medius worse than gluteus minimus tendinosis. There is a 0.7 cm from front to back partial tear of the anterior right gluteus medius with 1-2 cm of retraction.  Healed right hip fracture in anatomic position and alignment. Negative for a vascular necrosis of the femoral head or secondary degenerative disease about the hip. 3. Refills of folic acid $RemoveBe'1mg'gqPjkIkII$  qd for SLE and singulair for allergic rhinitis  Review of Systems  Constitutional: Negative for weight loss.  HENT: Negative for hearing loss.   Eyes: Negative for blurred vision.  Respiratory: Negative for shortness of breath.   Cardiovascular: Negative for leg swelling.  Gastrointestinal: Positive for abdominal pain, nausea and vomiting. Negative for constipation.       Denies constipation per pt but several prior imaging scans +  Musculoskeletal: Positive for back pain, falls and joint pain.  Skin: Negative for rash.  Neurological: Positive for tremors.   Past Medical History:  Diagnosis Date  . ADHD   . Anemia   . ANXIETY 06/06/2007  . Arthritis    related to lupus  . Asthma   . BACK PAIN 02/22/2009  . Cervical cancer ([place])    LEEP 2005   . Chicken pox   . Chronic pain    goes to pain management provider  . Collagen vascular disease  ([place])   . Connective tissue disease ([place])   . DEPRESSION 06/06/2007  . Essential tremor   . Fibromyalgia   . Fibromyalgia   . FREQUENCY, URINARY 07/07/2007  . Gastritis   . GERD 06/06/2007  . Glaucoma   . Headache    h/o migraines   . History of hiatal hernia   . History of kidney stones   . HYPOTHYROIDISM 06/06/2007  . INTERSTITIAL CYSTITIS 09/11/2010  . Lupus ([place]) 2006  . Lupus ([place])   . Menopause   . Migraine   . Mitral valve regurgitation   . NEPHROLITHIASIS, HX OF 11/05/2010  . Neuropathy   . Neuropathy   . OCD (obsessive compulsive disorder)   . Optic neuritis    2005  . OSTEOPENIA 10/14/2009  . Osteoporosis   . OVERACTIVE BLADDER 10/14/2009  . PAIN, CHRONIC NEC 06/06/2007  . Panniculitis   . Parkinson disease ([place]) 04/03/2019  . POLYARTHRITIS 08/19/2007  . Raynaud's disease   . Sicca syndrome ([place])   . 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 Vaginal prolapse    Dr. Marcelline Mates [facility]   . Vasculitis ([place])   . WEIGHT GAIN 02/22/2009   Past Surgical History:  Procedure Laterality Date  . APPENDECTOMY    . CERVICAL BIOPSY  W/ LOOP ELECTRODE EXCISION  2005  . CHOLECYSTECTOMY    . ESOPHAGOGASTRODUODENOSCOPY (EGD) WITH PROPOFOL N/A 08/27/2017   Procedure: ESOPHAGOGASTRODUODENOSCOPY (EGD) WITH PROPOFOL;  Surgeon:  Manya Silvas, MD;  [facility] ENDOSCOPY;  Service: Endoscopy;  Laterality: N/A;  . HAND SURGERY     repair of left metatarsal   . HARDWARE REMOVAL Right 02/16/2017   Procedure: HARDWARE REMOVAL FROM HIP;  Surgeon: Hessie Knows, MD;  Location: [facility] ORS;  Service: Orthopedics;  Laterality: Right;  . JOINT REPLACEMENT    . OTHER SURGICAL HISTORY     left 3rd metatarsal fracture repair 2011   . REVISION TOTAL HIP ARTHROPLASTY  06/2009   replacement 2010 then screw and plate removal 4431; right hip  . TONSILLECTOMY    . TONSILLECTOMY    . wrist  ligament repair bilateral    . WRIST RECONSTRUCTION    . WRIST SURGERY     laceration of right wrist ligaments  .  wrist surgery     laceration of left wrist surgery    Family History  Problem Relation Age of Onset  . Hypertension Mother   . Arthritis Mother   . Heart disease Mother        afib  . Asthma Sister   . Asthma Daughter   . Arthritis Daughter   . Heart disease Daughter        ?heart condition on BB  . ADD / ADHD Daughter   . Pancreatic cancer Father   . Cancer Father        pancreatitic   . Diabetes Maternal Grandmother   . Heart disease Maternal Grandmother   . Arthritis Maternal Grandmother   . Hypertension Maternal Grandmother   . Colon cancer Maternal Uncle 6  . Crohn's disease Maternal Aunt   . Diabetes Maternal Aunt   . Breast cancer Cousin 58       maternal  . Cancer Cousin        m cousin breat cancer s/p removal both breasts    Social History   Socioeconomic History  . Marital status: Single    Spouse name: Not on file  . Number of children: 1  . Years of education: Not on file  . Highest education level: Not on file  Occupational History  . Occupation: DISABLED    Employer: UNEMPLOYED  Tobacco Use  . Smoking status: Never Smoker  . Smokeless tobacco: Never Used  Vaping Use  . Vaping Use: Never used  Substance and Sexual Activity  . Alcohol use: No  . Drug use: No  . Sexual activity: Not Currently    Birth control/protection: None, Post-menopausal  Other Topics Concern  . 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 Difficulty of Paying Living Expenses: Not hard at all  Food Insecurity: No Food Insecurity  . Worried About Charity fundraiser in the Last Year: Never true  . Ran Out of Food in the Last Year: Never true  Transportation Needs: No Transportation Needs  . Lack of Transportation (Medical): No  . Lack of Transportation (Non-Medical): No   Physical Activity: Insufficiently Active  . Days of Exercise per Week: 7 days  . Minutes of Exercise per Session: 20 min  Stress: No Stress Concern Present  . Feeling of Stress : Only a little  Social Connections:   . Frequency of Communication with Friends and Family: Not on file  .  Frequency of Social Gatherings with Friends and Family: Not on file  . Attends Religious Services: Not on file  . Active Member of Clubs or Organizations: Not on file  . Attends Archivist Meetings: Not on file  . Marital Status: Not on file  Intimate Partner Violence:   . Fear of Current or Ex-Partner: Not on file  . Emotionally Abused: Not on file  . Physically Abused: Not on file  . Sexually Abused: Not on file   Current Meds  Medication Sig  . acetaminophen (ACETAMINOPHEN 8 HOUR) 650 MG CR tablet Take 650 mg by mouth every 8 (eight) hours as needed for pain.  Marland Kitchen albuterol (VENTOLIN HFA) 108 (90 Base) MCG/ACT inhaler Inhale 1-2 puffs into the lungs every 4 (four) hours as needed for wheezing or shortness of breath.  . ARIPiprazole (ABILIFY) 20 MG tablet Take 20 mg by mouth daily.  . Ascorbic Acid (VITAMIN C) 500 MG CHEW Chew 2,500 mg by mouth daily.  Marland Kitchen aspirin 325 MG tablet Take 1 tablet (325 mg total) by mouth daily.  . Biotin 10 MG CAPS Take by mouth.  . Biotin 5000 MCG SUBL Place 10,000 mcg under the tongue daily.  . carbidopa-levodopa (SINEMET IR) 10-100 MG tablet Take 0.5 tablets by mouth 3 (three) times daily.  . Cholecalciferol (VITAMIN D3) 2000 units capsule Vitamin D3 2,000 unit capsule  . dapsone 25 MG tablet Take by mouth daily.   Marland Kitchen dexmethylphenidate (FOCALIN XR) 20 MG 24 hr capsule dexmethylphenidate ER 20 mg capsule,extended release biphasic50-50  . diclofenac sodium (VOLTAREN) 1 % GEL Apply 2 g topically 4 (four) times daily as needed (for knee & finger pain.).   Marland Kitchen dicyclomine (BENTYL) 10 MG capsule dicyclomine 10 mg capsule  every 8hours as needed  . DULoxetine (CYMBALTA)  60 MG capsule Take 60 mg by mouth 2 (two) times daily.  Regino Schultze Bandages & Supports (MEDICAL COMPRESSION STOCKINGS) MISC Please provide 39mmHg compression stockings  . fentaNYL (DURAGESIC - DOSED MCG/HR) 75 MCG/HR Place 75 mcg onto the skin every 3 (three) days.  . Ferrous Sulfate (IRON PO) Take by mouth.  . fluocinonide ointment (LIDEX) 0.05 % fluocinonide 0.05 % topical ointment  APPLY TO THE AFFECTED AREA(S) BY TOPICAL ROUTE 2 TIMES PER DAY  . Fluticasone-Salmeterol (ADVAIR DISKUS) 250-50 MCG/DOSE AEPB Inhale 1 puff into the lungs 2 (two) times daily. Rinse mouth  . folic acid (FOLVITE) 1 MG tablet Take 1 tablet (1 mg total) by mouth daily.  . hydrocortisone (PROCTOZONE-HC) 2.5 % rectal cream Proctozone-HC 2.5 % topical cream perineal applicator  . hydroxychloroquine (PLAQUENIL) 200 MG tablet Plaquenil 200 mg tablet  Take 1 tablet twice a day by oral route after meals for 90 days.  Marland Kitchen ibuprofen (ADVIL,MOTRIN) 800 MG tablet Take 1 tablet (800 mg total) by mouth every 8 (eight) hours as needed.  . isosorbide mononitrate (IMDUR) 60 MG 24 hr tablet Take 1 tablet (60 mg total) by mouth daily.  Marland Kitchen levothyroxine (SYNTHROID) 75 MCG tablet Take 1 tablet (75 mcg total) by mouth daily before breakfast. 30 minutes before food  . LORazepam (ATIVAN) 2 MG tablet Take 1-2 mg by mouth See admin instructions. 1 MG DAILY AS NEEDED FOR ANXIETY & SCHEDULED AT BEDTIME EACH NIGHT  . methotrexate 250 MG/10ML injection Inject into the skin once a week.   . methylphenidate 18 MG PO CR tablet Take by mouth.   . montelukast (SINGULAIR) 10 MG tablet montelukast 10 mg tablet 1 pill qhs  . Multiple  Vitamins-Minerals (MULTI-VITAMIN GUMMIES PO) Multi Vitamin  GUMMIES  . omeprazole (PRILOSEC) 40 MG capsule Take 40 mg by mouth 2 (two) times daily.  . ondansetron (ZOFRAN) 4 MG tablet Take 1 tablet (4 mg total) by mouth every 8 (eight) hours as needed for nausea or vomiting.  Marland Kitchen PARoxetine (PAXIL) 10 MG tablet TAKE 1 TABLET (10  MG TOTAL) BY MOUTH AT BEDTIME.  . polyethylene glycol powder (GLYCOLAX/MIRALAX) powder Take 17 g by mouth daily.  . Polyvinyl Alcohol-Povidone (REFRESH OP) Place 1 drop into both eyes 2 (two) times daily.  . predniSONE (DELTASONE) 10 MG tablet prednisone 10 mg tablet  1 daily  . Probiotic Product (PROBIOTIC PO) Probiotic 10 billion cell capsule  daily  . Sodium Chloride 3 % AERS Saline Nasal Mist 3 %  spray nose deeply every hour or two and blow nose  . sucralfate (CARAFATE) 1 g tablet Take 1 tablet (1 g total) by mouth 4 (four) times daily -  with meals and at bedtime. Dissolve in 8 ounces of liquid  . terbinafine (LAMISIL AT) 1 % cream Apply 1 application topically 2 (two) times daily.  Marland Kitchen tiZANidine (ZANAFLEX) 4 MG capsule Take by mouth.  . topiramate (TOPAMAX) 100 MG tablet 100-150 mg. 100 in am and 150 qhs  . triamcinolone ointment (KENALOG) 0.5 % Apply 1 application topically daily.  . valACYclovir (VALTREX) 500 MG tablet valacyclovir 500 mg tablet  1 daily  . [DISCONTINUED] folic acid (FOLVITE) 1 MG tablet Take 1 tablet (1 mg total) by mouth daily.  . [DISCONTINUED] montelukast (SINGULAIR) 10 MG tablet montelukast 10 mg tablet 1 pill qhs  . [DISCONTINUED] ondansetron (ZOFRAN) 4 MG tablet Zofran 8 mg tablet  Take 1 tablet every 8 hours by oral route as needed.  . [DISCONTINUED] sucralfate (CARAFATE) 1 g tablet sucralfate 1 gram tablet   Allergies  Allergen Reactions  . Bee Venom Itching and Swelling    Affected area Affected area Affected area Affected area   . Fish Allergy Hives, Swelling and Rash    Facial swelling  Facial swelling  Facial swelling Facial swelling  Facial swelling   . Latex Anaphylaxis, Rash and Shortness Of Breath    Rash  Rash    . Prasterone Other (See Comments) and Nausea And Vomiting    rash Other reaction(s): Headache Headaches.  . Shellfish Allergy Hives, Other (See Comments), Rash and Swelling    Facial swelling Uncoded Allergy.  Allergen: seafood Uncoded Allergy. Allergen: CATS, Other Reaction: itch, wheezing Uncoded Allergy. Allergen: COMPAZINE, Other Reaction: tremors Facial swelling Facial swelling Uncoded Allergy. Allergen: seafood Uncoded Allergy. Allergen: CATS, Other Reaction: itch, wheezing Uncoded Allergy. Allergen: COMPAZINE, Other Reaction: tremors Facial swelling Uncoded Allergy. Allergen: seafood Uncoded Allergy. Allergen: CATS, Other Reaction: itch, wheezing Uncoded Allergy. Allergen: COMPAZINE, Other Reaction: tremors Facial swelling Facial swelling Uncoded Allergy. Allergen: seafood Uncoded Allergy. Allergen: CATS, Other Reaction: itch, wheezing Uncoded Allergy. Allergen: COMPAZINE, Other Reaction: tremors Facial swelling Facial swelling Uncoded Allergy. Allergen: seafood Uncoded Allergy. Allergen: CATS, Other Reaction: itch, wheezing Uncoded Allergy. Allergen: COMPAZINE, Other Reaction: tremors   . Shellfish-Derived Products Hives, Other (See Comments), Rash and Swelling    Facial swelling Uncoded Allergy. Allergen: seafood Uncoded Allergy. Allergen: CATS, Other Reaction: itch, wheezing Uncoded Allergy. Allergen: COMPAZINE, Other Reaction: tremors Facial swelling Facial swelling Uncoded Allergy. Allergen: seafood Uncoded Allergy. Allergen: CATS, Other Reaction: itch, wheezing Uncoded Allergy. Allergen: COMPAZINE, Other Reaction: tremors Facial swelling Uncoded Allergy. Allergen: seafood Uncoded Allergy. Allergen: CATS, Other Reaction: itch, wheezing Uncoded Allergy.  Allergen: COMPAZINE, Other Reaction: tremors Facial swelling  . Compazine [Prochlorperazine Edisylate] Other (See Comments)    tremors  . Dhea [Nutritional Supplements] Other (See Comments)    Headaches.  . Dilaudid [Hydromorphone Hcl]     ? reaction  . Famotidine Other (See Comments) and Diarrhea    Other reaction(s): Headache Gas  . Lactose   . Lactose Intolerance (Gi)   . Other Other (See Comments)    Other  reaction(s): Unknown   . Prochlorperazine     Other reaction(s): Other (See Comments) ticks  . Promethazine     Other reaction(s): Other (See Comments) Ticks  . Promethazine Hcl Other (See Comments)    CNS disorder  . Reclast [Zoledronic Acid]     Weakness could not move limbs, fatigue, increase sleep  . Clarithromycin Hives and Rash  . Clotrimazole Swelling and Rash  . Hydromorphone Hcl Rash and Hives    "Rash all over"  . Penicillins Rash and Other (See Comments)    Has patient had a PCN reaction causing immediate rash, facial/tongue/throat swelling, SOB or lightheadedness with hypotension:No Has patient had a PCN reaction causing severe rash involving mucus membranes or skin necrosis:No Has patient had a PCN reaction that required hospitalization:No Has patient had a PCN reaction occurring within the last 10 years:No If all of the above answers are "NO", then may proceed with Cephalosporin use.   Recent Results (from the past 2160 hour(s))  Urinalysis, Routine w reflex microscopic     Status: Abnormal   Collection Time: 09/06/20  1:46 PM  Result Value Ref Range   Color, Urine YELLOW Yellow;Lt. Yellow;Straw;Dark Yellow;Amber;Green;Red;Brown   APPearance CLEAR Clear;Turbid;Slightly Cloudy;Cloudy   Specific Gravity, Urine >=1.030 (A) 1.000 - 1.030   pH 6.0 5.0 - 8.0   Total Protein, Urine NEGATIVE Negative   Urine Glucose NEGATIVE Negative   Ketones, ur TRACE (A) Negative   Bilirubin Urine NEGATIVE Negative   Hgb urine dipstick NEGATIVE Negative   Urobilinogen, UA 0.2 0.0 - 1.0   Leukocytes,Ua NEGATIVE Negative   Nitrite NEGATIVE Negative   WBC, UA 0-2/hpf 0-2/hpf   RBC / HPF none seen 0-2/hpf   Squamous Epithelial / LPF Rare(0-4/hpf) Rare(0-4/hpf)   Ca Oxalate Crys, UA Presence of (A) None  Urine Culture     Status: None   Collection Time: 09/06/20  1:46 PM   Specimen: Urine  Result Value Ref Range   MICRO NUMBER: [id number]    SPECIMEN QUALITY: Adequate    Sample  Source NOT GIVEN    STATUS: FINAL    Result: No Growth    Objective  Body mass index is 29.56 kg/m. Wt Readings from Last 3 Encounters:  09/06/20 206 lb (93.4 kg)  06/05/20 206 lb 9.6 oz (93.7 kg)  04/17/20 206 lb (93.4 kg)   Temp Readings from Last 3 Encounters:  09/06/20 98.2 F (36.8 C) (Oral)  06/05/20 98.4 F (36.9 C) (Oral)  04/17/20 98.2 F (36.8 C) (Oral)   BP Readings from Last 3 Encounters:  09/06/20 130/80  06/05/20 124/70  04/17/20 124/80   Pulse Readings from Last 3 Encounters:  09/06/20 91  06/05/20 88  04/17/20 86    Physical Exam Vitals and nursing note reviewed.  Constitutional:      Appearance: Normal appearance. She is well-developed, well-groomed and overweight.  HENT:     Head: Normocephalic and atraumatic.  Cardiovascular:     Rate and Rhythm: Normal rate and regular rhythm.     Heart sounds: Normal heart  sounds.  Abdominal:     General: Bowel sounds are normal. There is no distension.     Tenderness: There is generalized abdominal tenderness and tenderness in the right upper quadrant, right lower quadrant, epigastric area, periumbilical area, suprapubic area, left upper quadrant and left lower quadrant.  Neurological:     Mental Status: She is alert and oriented to person, place, and time.     Motor: Tremor present.     Gait: Gait normal.  Psychiatric:        Attention and Perception: Attention and perception normal.        Mood and Affect: Mood and affect normal.        Speech: Speech normal.        Behavior: Behavior normal. Behavior is cooperative.        Thought Content: Thought content normal.        Cognition and Memory: Cognition and memory normal.        Judgment: Judgment normal.     Assessment  Plan  Lower abdominal pain r/o GI (I.e chronic constipation) vs GU etiology r/o kidney stones - Plan: Urinalysis, Routine w reflex microscopic, Urine Culture, DG Abd 1 View rec bentyl qid prn ab spasms  F/u with [facility]   Nausea and  vomiting, intractability of vomiting not specified, unspecified vomiting type - Plan: Urinalysis, Routine w reflex microscopic, Urine Culture, DG Abd 1 View, ondansetron (ZOFRAN) 4 MG tablet, sucralfate (CARAFATE) 1 g tablet  Gastroesophageal reflux disease without esophagitis - Plan: sucralfate (CARAFATE) 1 g tablet  Tear of right gluteus medius tendon, subsequent encounter -given exercises to do due to not able to pay for PT -f/u Dr. Rudene Christians   Systemic lupus erythematosus, unspecified SLE type, unspecified organ involvement status ([place]) - Plan: folic acid (FOLVITE) 1 MG tablet  Allergic rhinitis, unspecified seasonality, unspecified trigger - Plan: montelukast (SINGULAIR) 10 MG tablet  HM She had labs 9/7&20/21 tsh 2.21, nl cr/ast/alt 9/7 08/19/20 cbc plts low 139, UA, vit D 59  Had flu shotutd Had pna 23in 2013 consider repeat in future  prevnar10/03/2014 Tdap11/20/17 covid vx had 2/2 pfizer consider booster 6-8 months post 04/09/20   Think about shingrix vaccinein future prev discussed  ImmuneMMR and hep B  Colonoscopy1/18/16h/o polypsDr. Rayann Heman IH/EH polyp hyperplastic consider repeat in 5-10 years FH m uncle colon cancer EGD 08/27/2017 negative bxs +hiatal hernia   DEXA 06/29/17 osteopeniah/o osteoporosis [facility] and [facility] was to try Reclast again though listed on her allergy list he does not think true allergy  Mammogram9/23/20 negativeordered pt needs to call and schedule  Pap8/2/19 Dr. Marcelline Mates neg pap neg HPV  See CT chest  IMPRESSION: 1. No CT evidence for acute pulmonary embolus. 2. Symmetric dependent ground-glass attenuation in the lungs, similar to prior study. Imaging features likely related to hypo expansion/ compressive atelectasis.  Dermatology Barnetta Chapel saw early 10/2019   rec vitamin D3 4000 Iu daily vitamin D 06/08/19 was 24   Vitamin H84 696 2/95/28  Folic acid >41 02/21/39   Eye exam had 10/2019 will  return 01/28/20   Of note [facility] rheumatology Dr. Nancy Fetter f/u sch in 01/2020, eye exam 01/28/20 to f/u HCQ monitoring  rec healthy diet and exercise   [facility] neuro Dr. Angela Nevin rheum Dr. Evette Cristal [facility] rheumatology [facility] as of 03/20/21 wants new referral [facility] Dr. Alice Reichert, Effie Berkshire  Cards Dr. Moses Manners Dr. Mendel Ryder endocrine Dr. Prudencio Burly   Provider: Dr. Olivia Mackie McLean-Scocuzza-Internal Medicine

## 2020-09-08 LAB — URINE CULTURE
MICRO NUMBER:: 11049840
Result:: NO GROWTH
SPECIMEN QUALITY:: ADEQUATE

## 2020-09-09 ENCOUNTER — Telehealth: Payer: Self-pay

## 2020-09-09 DIAGNOSIS — S76011A Strain of muscle, fascia and tendon of right hip, initial encounter: Secondary | ICD-10-CM | POA: Insufficient documentation

## 2020-09-09 MED ORDER — FOLIC ACID 1 MG PO TABS: 1.0000 mg | ORAL_TABLET | Freq: Every day | ORAL | 3 refills | Status: DC

## 2020-09-09 MED ORDER — MONTELUKAST SODIUM 10 MG PO TABS: ORAL_TABLET | ORAL | 3 refills | Status: DC

## 2020-09-09 NOTE — Telephone Encounter (Signed)
-----   Message from Delorise Jackson, MD sent at 09/08/2020  4:42 PM EDT ----- No UTI

## 2020-09-09 NOTE — Telephone Encounter (Signed)
Unable to leave voicemail to set up 3-4 month f/u with fasting labs one week prior

## 2020-09-09 NOTE — Telephone Encounter (Signed)
-----   Message from Delorise Jackson, MD sent at 09/08/2020  4:42 PM EDT ----- +constipated moderate to large  -rec take laxative senna/colace, milk of magnesium, miralax daily x 3-5 days  No kidney stones  Arthritis mild to moderate spine, hips, pelvis Some plaque build up in splenic and iliac arteries

## 2020-09-10 ENCOUNTER — Telehealth: Payer: Self-pay | Admitting: Internal Medicine

## 2020-09-10 NOTE — Telephone Encounter (Signed)
Sent via epic routing  

## 2020-09-10 NOTE — Telephone Encounter (Signed)
-----   Message from Delorise Jackson, MD sent at 09/10/2020  8:09 AM EDT ----- Fax note to [facility] NP or PA attnThanksTMS

## 2020-09-25 ENCOUNTER — Encounter: Payer: Medicare Other | Admitting: Obstetrics and Gynecology

## 2020-09-27 ENCOUNTER — Ambulatory Visit: Payer: Medicare Other

## 2020-10-02 ENCOUNTER — Telehealth: Payer: Self-pay | Admitting: Internal Medicine

## 2020-10-02 NOTE — Telephone Encounter (Signed)
Faxed non -formulary exception request form to [employer] for generic product for medication faxed on 10-02-2020

## 2020-10-04 ENCOUNTER — Telehealth: Payer: Self-pay | Admitting: Internal Medicine

## 2020-10-04 NOTE — Telephone Encounter (Signed)
Prior authorization has been submitted for patient's Zofran 4mg   Awaiting approval or denial.

## 2020-11-12 ENCOUNTER — Other Ambulatory Visit: Payer: Self-pay | Admitting: Obstetrics and Gynecology

## 2020-11-12 DIAGNOSIS — N951 Menopausal and female climacteric states: Secondary | ICD-10-CM

## 2020-11-12 NOTE — Telephone Encounter (Signed)
Please follow up with patient to see if the medication is helping her, or if she has any issues or side effects.  I will give her a refill until her appointment in February.

## 2020-11-13 NOTE — Telephone Encounter (Signed)
Called pt no answer unable to LM due to no VM has been set up. Will send pt a mychart message.

## 2020-11-13 NOTE — Telephone Encounter (Signed)
Please see phone encounter and mychart message. No answer when called pt unable to LM due to no VM had been set up sent pt a mychart message.

## 2020-11-15 ENCOUNTER — Ambulatory Visit
Admission: RE | Admit: 2020-11-15 | Discharge: 2020-11-15 | Disposition: A | Discharge status: Home / Self Care | Payer: Medicare Other | Source: Ambulatory Visit | Attending: Internal Medicine | Admitting: Internal Medicine

## 2020-11-15 ENCOUNTER — Other Ambulatory Visit: Payer: Self-pay

## 2020-11-15 DIAGNOSIS — Z1231 Encounter for screening mammogram for malignant neoplasm of breast: Secondary | ICD-10-CM | POA: Insufficient documentation

## 2020-11-21 ENCOUNTER — Other Ambulatory Visit: Payer: Self-pay

## 2020-11-21 ENCOUNTER — Encounter: Payer: Self-pay | Admitting: Intensive Care

## 2020-11-21 ENCOUNTER — Emergency Department
Admission: EM | Admit: 2020-11-21 | Discharge: 2020-11-21 | Disposition: A | Discharge status: Home / Self Care | Payer: Medicare Other | Attending: Emergency Medicine | Admitting: Emergency Medicine

## 2020-11-21 DIAGNOSIS — M549 Dorsalgia, unspecified: Secondary | ICD-10-CM | POA: Insufficient documentation

## 2020-11-21 DIAGNOSIS — Z5321 Procedure and treatment not carried out due to patient leaving prior to being seen by health care provider: Secondary | ICD-10-CM | POA: Diagnosis not present

## 2020-11-21 DIAGNOSIS — R110 Nausea: Secondary | ICD-10-CM | POA: Insufficient documentation

## 2020-11-21 DIAGNOSIS — R079 Chest pain, unspecified: Secondary | ICD-10-CM | POA: Insufficient documentation

## 2020-11-21 LAB — CBC
HCT: 37 % (ref 36.0–46.0)
Hemoglobin: 11.8 g/dL — ABNORMAL LOW (ref 12.0–15.0)
MCH: 29 pg (ref 26.0–34.0)
MCHC: 31.9 g/dL (ref 30.0–36.0)
MCV: 90.9 fL (ref 80.0–100.0)
Platelets: 143 10*3/uL — ABNORMAL LOW (ref 150–400)
RBC: 4.07 MIL/uL (ref 3.87–5.11)
RDW: 13 % (ref 11.5–15.5)
WBC: 4.4 10*3/uL (ref 4.0–10.5)
nRBC: 0 % (ref 0.0–0.2)

## 2020-11-21 LAB — BASIC METABOLIC PANEL
Anion gap: 8 (ref 5–15)
BUN: 12 mg/dL (ref 6–20)
CO2: 26 mmol/L (ref 22–32)
Calcium: 9 mg/dL (ref 8.9–10.3)
Chloride: 107 mmol/L (ref 98–111)
Creatinine, Ser: 0.84 mg/dL (ref 0.44–1.00)
GFR, Estimated: >60 mL/min (ref >60)
Glucose, Bld: 86 mg/dL (ref 70–99)
Potassium: 4 mmol/L (ref 3.5–5.1)
Sodium: 141 mmol/L (ref 135–145)

## 2020-11-21 LAB — TROPONIN I (HIGH SENSITIVITY): Troponin I (High Sensitivity): 3 ng/L (ref <18)

## 2020-11-21 NOTE — ED Triage Notes (Signed)
Patient c/o left sided chest pain with radiation to back. Reports breaking out in cold sweats and nausea.

## 2020-11-21 NOTE — ED Notes (Signed)
Pt reports is leaving. Encouraged pt to stay but pt is leaving. Encouraged pt to call her MD and advise them that she was in the ED but left before being seen.

## 2020-11-26 ENCOUNTER — Ambulatory Visit (INDEPENDENT_AMBULATORY_CARE_PROVIDER_SITE_OTHER): Payer: Medicare Other

## 2020-11-26 VITALS — Ht 70.0 in | Wt 202.0 lb

## 2020-11-26 DIAGNOSIS — Z0000 Encounter for general adult medical examination without abnormal findings: Secondary | ICD-10-CM | POA: Diagnosis not present

## 2020-11-26 NOTE — Patient Instructions (Addendum)
[name] , Thank you for taking time to come for your Medicare Wellness Visit. I appreciate your ongoing commitment to your health goals. Please review the following plan we discussed and let me know if I can assist you in the future.   These are the goals we discussed: Goals    . Follow up with Primary Care Provider     As needed       This is a list of the screening recommended for you and due dates:  Health Maintenance  Topic Date Due  . Pap Smear  07/01/2021  . Mammogram  11/15/2022  . Colon Cancer Screening  12/17/2024  . Tetanus Vaccine  10/19/2026  . Flu Shot  Completed  . Pneumococcal vaccine  Completed  . COVID-19 Vaccine  Completed  .  Hepatitis C: One time screening is recommended by Center for Disease Control  (CDC) for  adults born from [birth year] through 1967.   Completed  . HIV Screening  Completed  . Complete foot exam   Discontinued  . Hemoglobin A1C  Discontinued  . Eye exam for diabetics  Discontinued  . Urine Protein Check  Discontinued    Immunizations Immunization History  Administered Date(s) Administered  . Influenza Inj Mdck Quad Pf 08/30/2017  . Influenza Split 08/13/2011, 08/19/2012, 09/18/2013  . Influenza Whole 09/13/2007, 09/12/2008, 08/06/2010  . Influenza,inj,Quad PF,6+ Mos 08/13/2011, 08/19/2012, 09/18/2013, 08/21/2014, 09/18/2014, 09/02/2015, 09/01/2016, 08/30/2017, 08/19/2018, 07/28/2019, 08/06/2020  . Influenza-Unspecified 09/02/2015, 08/30/2017  . PFIZER SARS-COV-2 Vaccination 03/19/2020, 04/09/2020  . Pneumococcal Conjugate-13 11/30/2004, 06/04/2014, 09/03/2014  . Pneumococcal Polysaccharide-23 11/30/2004, 09/05/2012  . Td 11/30/2004  . Tdap 09/11/2014, 11/05/2014, 10/19/2016   Advanced directives:   Conditions/risks identified: none new.   Follow up in one year for your annual wellness visit.   Preventive Care 40-64 Years, Female Preventive care refers to lifestyle choices and visits with your health care provider that  can promote health and wellness. What does preventive care include?  A yearly physical exam. This is also called an annual well check.  Dental exams once or twice a year.  Routine eye exams. Ask your health care provider how often you should have your eyes checked.  Personal lifestyle choices, including:  Daily care of your teeth and gums.  Regular physical activity.  Eating a healthy diet.  Avoiding tobacco and drug use.  Limiting alcohol use.  Practicing safe sex.  Taking low-dose aspirin daily starting at age 37.  Taking vitamin and mineral supplements as recommended by your health care provider. What happens during an annual well check? The services and screenings done by your health care provider during your annual well check will depend on your age, overall health, lifestyle risk factors, and family history of disease. Counseling  Your health care provider may ask you questions about your:  Alcohol use.  Tobacco use.  Drug use.  Emotional well-being.  Home and relationship well-being.  Sexual activity.  Eating habits.  Work and work Statistician.  Method of birth control.  Menstrual cycle.  Pregnancy history. Screening  You may have the following tests or measurements:  Height, weight, and BMI.  Blood pressure.  Lipid and cholesterol levels. These may be checked every 5 years, or more frequently if you are over 46 years old.  Skin check.  Lung cancer screening. You may have this screening every year starting at age 81 if you have a 30-pack-year history of smoking and currently smoke or have quit within the past 15 years.  Fecal  occult blood test (FOBT) of the stool. You may have this test every year starting at age 19.  Flexible sigmoidoscopy or colonoscopy. You may have a sigmoidoscopy every 5 years or a colonoscopy every 10 years starting at age 75.  Hepatitis C blood test.  Hepatitis B blood test.  Sexually transmitted disease (STD)  testing.  Diabetes screening. This is done by checking your blood sugar (glucose) after you have not eaten for a while (fasting). You may have this done every 1-3 years.  Mammogram. This may be done every 1-2 years. Talk to your health care provider about when you should start having regular mammograms. This may depend on whether you have a family history of breast cancer.  BRCA-related cancer screening. This may be done if you have a family history of breast, ovarian, tubal, or peritoneal cancers.  Pelvic exam and Pap test. This may be done every 3 years starting at age 72. Starting at age 27, this may be done every 5 years if you have a Pap test in combination with an HPV test.  Bone density scan. This is done to screen for osteoporosis. You may have this scan if you are at high risk for osteoporosis. Discuss your test results, treatment options, and if necessary, the need for more tests with your health care provider. Vaccines  Your health care provider may recommend certain vaccines, such as:  Influenza vaccine. This is recommended every year.  Tetanus, diphtheria, and acellular pertussis (Tdap, Td) vaccine. You may need a Td booster every 10 years.  Zoster vaccine. You may need this after age 23.  Pneumococcal 13-valent conjugate (PCV13) vaccine. You may need this if you have certain conditions and were not previously vaccinated.  Pneumococcal polysaccharide (PPSV23) vaccine. You may need one or two doses if you smoke cigarettes or if you have certain conditions. Talk to your health care provider about which screenings and vaccines you need and how often you need them. This information is not intended to replace advice given to you by your health care provider. Make sure you discuss any questions you have with your health care provider. Document Released: 12/13/2015 Document Revised: 08/05/2016 Document Reviewed: 09/17/2015 Elsevier Interactive Patient Education  2017 [employer] Prevention in the Home Falls can cause injuries. They can happen to people of all ages. There are many things you can do to make your home safe and to help prevent falls. What can I do on the outside of my home?  Regularly fix the edges of walkways and driveways and fix any cracks.  Remove anything that might make you trip as you walk through a door, such as a raised step or threshold.  Trim any bushes or trees on the path to your home.  Use bright outdoor lighting.  Clear any walking paths of anything that might make someone trip, such as rocks or tools.  Regularly check to see if handrails are loose or broken. Make sure that both sides of any steps have handrails.  Any raised decks and porches should have guardrails on the edges.  Have any leaves, snow, or ice cleared regularly.  Use sand or salt on walking paths during winter.  Clean up any spills in your garage right away. This includes oil or grease spills. What can I do in the bathroom?  Use night lights.  Install grab bars by the toilet and in the tub and shower. Do not use towel bars as grab bars.  Use  non-skid mats or decals in the tub or shower.  If you need to sit down in the shower, use a plastic, non-slip stool.  Keep the floor dry. Clean up any water that spills on the floor as soon as it happens.  Remove soap buildup in the tub or shower regularly.  Attach bath mats securely with double-sided non-slip rug tape.  Do not have throw rugs and other things on the floor that can make you trip. What can I do in the bedroom?  Use night lights.  Make sure that you have a light by your bed that is easy to reach.  Do not use any sheets or blankets that are too big for your bed. They should not hang down onto the floor.  Have a firm chair that has side arms. You can use this for support while you get dressed.  Do not have throw rugs and other things on the floor that can make you trip. What can I  do in the kitchen?  Clean up any spills right away.  Avoid walking on wet floors.  Keep items that you use a lot in easy-to-reach places.  If you need to reach something above you, use a strong step stool that has a grab bar.  Keep electrical cords out of the way.  Do not use floor polish or wax that makes floors slippery. If you must use wax, use non-skid floor wax.  Do not have throw rugs and other things on the floor that can make you trip. What can I do with my stairs?  Do not leave any items on the stairs.  Make sure that there are handrails on both sides of the stairs and use them. Fix handrails that are broken or loose. Make sure that handrails are as long as the stairways.  Check any carpeting to make sure that it is firmly attached to the stairs. Fix any carpet that is loose or worn.  Avoid having throw rugs at the top or bottom of the stairs. If you do have throw rugs, attach them to the floor with carpet tape.  Make sure that you have a light switch at the top of the stairs and the bottom of the stairs. If you do not have them, ask someone to add them for you. What else can I do to help prevent falls?  Wear shoes that:  Do not have high heels.  Have rubber bottoms.  Are comfortable and fit you well.  Are closed at the toe. Do not wear sandals.  If you use a stepladder:  Make sure that it is fully opened. Do not climb a closed stepladder.  Make sure that both sides of the stepladder are locked into place.  Ask someone to hold it for you, if possible.  Clearly mark and make sure that you can see:  Any grab bars or handrails.  First and last steps.  Where the edge of each step is.  Use tools that help you move around (mobility aids) if they are needed. These include:  Canes.  Walkers.  Scooters.  Crutches.  Turn on the lights when you go into a dark area. Replace any light bulbs as soon as they burn out.  Set up your furniture so you have a  clear path. Avoid moving your furniture around.  If any of your floors are uneven, fix them.  If there are any pets around you, be aware of where they are.  Review your medicines with  your doctor. Some medicines can make you feel dizzy. This can increase your chance of falling. Ask your doctor what other things that you can do to help prevent falls. This information is not intended to replace advice given to you by your health care provider. Make sure you discuss any questions you have with your health care provider. Document Released: 09/12/2009 Document Revised: 04/23/2016 Document Reviewed: 12/21/2014 Elsevier Interactive Patient Education  2017 [employer].

## 2020-11-26 NOTE — Progress Notes (Signed)
Subjective:   [name] [name] is a 54 y.o. female who presents for Medicare Annual (Subsequent) preventive examination.  Review of Systems    No ROS.  Medicare Wellness Virtual Visit.   Cardiac Risk Factors include: advanced age (>41men, >22 women)     Objective:    Today's Vitals   11/26/20 1308  Weight: 202 lb (91.6 kg)  Height: 5\' 10"  (1.778 m)   Body mass index is 28.98 kg/m.  Advanced Directives 11/26/2020 11/21/2020 04/17/2020 10/10/2019 09/27/2019 05/08/2019 04/08/2019  Does Patient Have a Medical Advance Directive? No No No No No No No  Would patient like information on creating a medical advance directive? No - Patient declined No - Patient declined - - No - Patient declined No - Patient declined No - Patient declined    Current Medications (verified) Outpatient Encounter Medications as of 11/26/2020  Medication Sig  . acetaminophen (ACETAMINOPHEN 8 HOUR) 650 MG CR tablet Take 650 mg by mouth every 8 (eight) hours as needed for pain.  Marland Kitchen albuterol (VENTOLIN HFA) 108 (90 Base) MCG/ACT inhaler Inhale 1-2 puffs into the lungs every 4 (four) hours as needed for wheezing or shortness of breath.  . ARIPiprazole (ABILIFY) 20 MG tablet Take 20 mg by mouth daily.  . Ascorbic Acid (VITAMIN C) 500 MG CHEW Chew 2,500 mg by mouth daily.  Marland Kitchen aspirin 325 MG tablet Take 1 tablet (325 mg total) by mouth daily.  . Biotin 10 MG CAPS Take by mouth.  . Biotin 5000 MCG SUBL Place 10,000 mcg under the tongue daily.  . carbidopa-levodopa (SINEMET IR) 10-100 MG tablet Take 0.5 tablets by mouth 3 (three) times daily.  . Cholecalciferol (VITAMIN D3) 2000 units capsule Vitamin D3 2,000 unit capsule  . dapsone 25 MG tablet Take by mouth daily.   Marland Kitchen dexmethylphenidate (FOCALIN XR) 20 MG 24 hr capsule dexmethylphenidate ER 20 mg capsule,extended release biphasic50-50  . diclofenac sodium (VOLTAREN) 1 % GEL Apply 2 g topically 4 (four) times daily as needed (for knee & finger pain.).   Marland Kitchen  dicyclomine (BENTYL) 10 MG capsule dicyclomine 10 mg capsule  every 8hours as needed  . DULoxetine (CYMBALTA) 60 MG capsule Take 60 mg by mouth 2 (two) times daily.  Regino Schultze Bandages & Supports (MEDICAL COMPRESSION STOCKINGS) MISC Please provide 79mmHg compression stockings  . fentaNYL (DURAGESIC - DOSED MCG/HR) 75 MCG/HR Place 75 mcg onto the skin every 3 (three) days.  . Ferrous Sulfate (IRON PO) Take by mouth.  . fluocinonide ointment (LIDEX) 0.05 % fluocinonide 0.05 % topical ointment  APPLY TO THE AFFECTED AREA(S) BY TOPICAL ROUTE 2 TIMES PER DAY  . Fluticasone-Salmeterol (ADVAIR DISKUS) 250-50 MCG/DOSE AEPB Inhale 1 puff into the lungs 2 (two) times daily. Rinse mouth  . folic acid (FOLVITE) 1 MG tablet Take 1 tablet (1 mg total) by mouth daily.  . furosemide (LASIX) 20 MG tablet Take 20 mg by mouth daily as needed.   . hydrocortisone (PROCTOZONE-HC) 2.5 % rectal cream Proctozone-HC 2.5 % topical cream perineal applicator  . hydroxychloroquine (PLAQUENIL) 200 MG tablet Plaquenil 200 mg tablet  Take 1 tablet twice a day by oral route after meals for 90 days.  Marland Kitchen ibuprofen (ADVIL,MOTRIN) 800 MG tablet Take 1 tablet (800 mg total) by mouth every 8 (eight) hours as needed.  . imiquimod (ALDARA) 5 % cream imiquimod 5 % topical cream packet  APPLY TO THE AFFECTED AREA(S) BY TOPICAL ROUTE 3 TIMES PER WEEK at bedtime (Patient not taking: Reported on 09/06/2020)  .  isosorbide mononitrate (IMDUR) 60 MG 24 hr tablet Take 1 tablet (60 mg total) by mouth daily.  Marland Kitchen levothyroxine (SYNTHROID) 75 MCG tablet Take 1 tablet (75 mcg total) by mouth daily before breakfast. 30 minutes before food  . LORazepam (ATIVAN) 2 MG tablet Take 1-2 mg by mouth See admin instructions. 1 MG DAILY AS NEEDED FOR ANXIETY & SCHEDULED AT BEDTIME EACH NIGHT  . methotrexate 250 MG/10ML injection Inject into the skin once a week.   . methylphenidate 18 MG PO CR tablet Take by mouth.   . montelukast (SINGULAIR) 10 MG tablet  montelukast 10 mg tablet 1 pill qhs  . Multiple Vitamins-Minerals (MULTI-VITAMIN GUMMIES PO) Multi Vitamin  GUMMIES  . omeprazole (PRILOSEC) 40 MG capsule Take 40 mg by mouth 2 (two) times daily.  . ondansetron (ZOFRAN) 4 MG tablet Take 1 tablet (4 mg total) by mouth every 8 (eight) hours as needed for nausea or vomiting.  Marland Kitchen PARoxetine (PAXIL) 10 MG tablet TAKE 1 TABLET (10 MG TOTAL) BY MOUTH AT BEDTIME.  . polyethylene glycol powder (GLYCOLAX/MIRALAX) powder Take 17 g by mouth daily.  . Polyvinyl Alcohol-Povidone (REFRESH OP) Place 1 drop into both eyes 2 (two) times daily.  . predniSONE (DELTASONE) 10 MG tablet prednisone 10 mg tablet  1 daily  . Probiotic Product (PROBIOTIC PO) Probiotic 10 billion cell capsule  daily  . Sodium Chloride 3 % AERS Saline Nasal Mist 3 %  spray nose deeply every hour or two and blow nose  . sucralfate (CARAFATE) 1 g tablet Take 1 tablet (1 g total) by mouth 4 (four) times daily -  with meals and at bedtime. Dissolve in 8 ounces of liquid  . terbinafine (LAMISIL AT) 1 % cream Apply 1 application topically 2 (two) times daily.  Marland Kitchen tiZANidine (ZANAFLEX) 4 MG capsule Take by mouth.  . topiramate (TOPAMAX) 100 MG tablet 100-150 mg. 100 in am and 150 qhs  . triamcinolone ointment (KENALOG) 0.5 % Apply 1 application topically daily.  . valACYclovir (VALTREX) 500 MG tablet valacyclovir 500 mg tablet  1 daily   No facility-administered encounter medications on file as of 11/26/2020.    Allergies (verified) Bee venom, Fish allergy, Latex, Prasterone, Shellfish allergy, Shellfish-derived products, Compazine [prochlorperazine edisylate], Dhea [nutritional supplements], Dilaudid [hydromorphone hcl], Famotidine, Lactose, Lactose intolerance (gi), Other, Prochlorperazine, Promethazine, Promethazine hcl, Reclast [zoledronic acid], Clarithromycin, Clotrimazole, Hydromorphone hcl, and Penicillins   History: Past Medical History:  Diagnosis Date  . ADHD   . Anemia   .  ANXIETY 06/06/2007  . Arthritis    related to lupus  . Asthma   . BACK PAIN 02/22/2009  . Cervical cancer ([place])    LEEP 2005   . Chicken pox   . Chronic pain    goes to pain management provider  . Collagen vascular disease ([place])   . Connective tissue disease ([place])   . DEPRESSION 06/06/2007  . Essential tremor   . Fibromyalgia   . Fibromyalgia   . FREQUENCY, URINARY 07/07/2007  . Gastritis   . GERD 06/06/2007  . Glaucoma   . Headache    h/o migraines   . History of hiatal hernia   . History of kidney stones   . HYPOTHYROIDISM 06/06/2007  . INTERSTITIAL CYSTITIS 09/11/2010  . Lupus ([place]) 2006  . Lupus ([place])   . Menopause   . Migraine   . Mitral valve regurgitation   . NEPHROLITHIASIS, HX OF 11/05/2010  . Neuropathy   . Neuropathy   . OCD (obsessive compulsive  disorder)   . Optic neuritis    2005  . OSTEOPENIA 10/14/2009  . Osteoporosis   . OVERACTIVE BLADDER 10/14/2009  . PAIN, CHRONIC NEC 06/06/2007  . Panniculitis   . Parkinson disease ([place]) 04/03/2019  . POLYARTHRITIS 08/19/2007  . Raynaud's disease   . Sicca syndrome ([place])   . 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 Vaginal prolapse    Dr. Marcelline Mates [facility]   . Vasculitis ([place])   . WEIGHT GAIN 02/22/2009   Past Surgical History:  Procedure Laterality Date  . APPENDECTOMY    . CERVICAL BIOPSY  W/ LOOP ELECTRODE EXCISION  2005  . CHOLECYSTECTOMY    . ESOPHAGOGASTRODUODENOSCOPY (EGD) WITH PROPOFOL N/A 08/27/2017   Procedure: ESOPHAGOGASTRODUODENOSCOPY (EGD) WITH PROPOFOL;  Surgeon: Manya Silvas, MD;  Location: [facility] ENDOSCOPY;  Service: Endoscopy;  Laterality: N/A;  . HAND SURGERY     repair of left metatarsal   . HARDWARE REMOVAL Right 02/16/2017   Procedure: HARDWARE REMOVAL FROM HIP;  Surgeon: Hessie Knows, MD;  Location: [facility] ORS;  Service: Orthopedics;  Laterality: Right;  . JOINT REPLACEMENT    . OTHER SURGICAL HISTORY     left 3rd metatarsal fracture repair 2011   . REVISION TOTAL HIP ARTHROPLASTY  06/2009    replacement 2010 then screw and plate removal [id number]; right hip  . TONSILLECTOMY    . TONSILLECTOMY    . wrist  ligament repair bilateral    . WRIST RECONSTRUCTION    . WRIST SURGERY     laceration of right wrist ligaments  . wrist surgery     laceration of left wrist surgery    Family History  Problem Relation Age of Onset  . Hypertension Mother   . Arthritis Mother   . Heart disease Mother        afib  . Asthma Sister   . Asthma Daughter   . Arthritis Daughter   . Heart disease Daughter        ?heart condition on BB  . ADD / ADHD Daughter   . Pancreatic cancer Father   . Cancer Father        pancreatitic   . Diabetes Maternal Grandmother   . Heart disease Maternal Grandmother   . Arthritis Maternal Grandmother   . Hypertension Maternal Grandmother   . Dementia Maternal Grandmother   . Colon cancer Maternal Uncle 43  . Crohn's disease Maternal Aunt   . Breast cancer Maternal Aunt 65  . Diabetes Maternal Aunt   . Breast cancer Cousin 9       maternal  . Cancer Cousin        m cousin breat cancer s/p removal both breasts    Social History   Socioeconomic History  . Marital status: Single    Spouse name: Not on file  . Number of children: 1  . Years of education: Not on file  . Highest education level: Not on file  Occupational History  . Occupation: DISABLED    Employer: UNEMPLOYED  Tobacco Use  . Smoking status: Never Smoker  . Smokeless tobacco: Never Used  Vaping Use  . Vaping Use: Never used  Substance and Sexual Activity  . Alcohol use: No  . Drug use: No  . Sexual activity: Not Currently    Birth control/protection: None, Post-menopausal  Other Topics Concern  . 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 Difficulty of Paying Living Expenses: Not  hard at all  Food Insecurity: No Food Insecurity  . Worried About Programme researcher, broadcasting/film/video in the Last Year: Never true  . Ran Out of Food in the Last Year: Never true  Transportation Needs: No Transportation Needs  . Lack of Transportation (Medical): No  . Lack of Transportation (Non-Medical): No  Physical Activity: Not on file  Stress: No Stress Concern Present  . Feeling of Stress : Not at all  Social Connections: Unknown  . Frequency of Communication with Friends and Family: More than three times a week  . Frequency of Social Gatherings with Friends and Family: Not on file  . Attends Religious Services: Not on file  . Active Member of Clubs or Organizations: Not on file  . Attends Banker Meetings: Not on file  . Marital Status: Not on file    Tobacco Counseling Counseling given: Not Answered   Clinical Intake:  Pre-visit preparation completed: Yes        Diabetes: No             Activities of Daily Living In your present state of health, do you have any difficulty performing the following activities: 11/26/2020  Hearing? N  Vision? N  Difficulty concentrating or making decisions? N  Walking or climbing stairs? Y  Comment Unsteady gait. Walker in use.  Dressing or bathing? N  Doing errands, shopping? Y  Comment Usually accompanied by someone  Preparing Food and eating ? Y  Using the Toilet? N  In the past six months, have you accidently leaked urine? N  Do you have problems with loss of bowel control? N  Managing your Medications? N  Managing your Finances? N  Housekeeping or managing your Housekeeping? Y  Some recent data might be hidden    Patient Care Team: McLean-Scocuzza, Pasty Spillers, MD as PCP - General (Internal Medicine)  Indicate any recent Medical Services you may have received from other than [facility] providers in the past year (date may be approximate).     Assessment:   This is a routine wellness examination for Don.  I connected  with Erian today by telephone and verified that I am speaking with the correct person using two identifiers. Location patient: home Location provider: work Persons participating in the virtual visit: patient, Engineer, civil (consulting).    I discussed the limitations, risks, security and privacy concerns of performing an evaluation and management service by telephone and the availability of in person appointments. The patient expressed understanding and verbally consented to this telephonic visit.    Interactive audio and video telecommunications were attempted between this provider and patient, however failed, due to patient having technical difficulties OR patient did not have access to video capability.  We continued and completed visit with audio only.  Some vital signs may be absent or patient reported.   Hearing/Vision screen  Hearing Screening   125Hz  250Hz  500Hz  1000Hz  2000Hz  3000Hz  4000Hz  6000Hz  8000Hz   Right ear:           Left ear:           Comments: Patient is able to hear conversational tones without difficulty.  No issues reported.  Vision Screening Comments: Followed by  Wears corrective lenses Cataract extraction, bilateral Visual acuity not assessed, virtual visit.  They have seen their ophthalmologist in the last 12  months.    Dietary issues and exercise activities discussed:  Current Exercise Habits: Home exercise routine, Intensity: Mild Low salt Good water intake   Goals    . Follow up with Primary Care Provider     As needed      Depression Screen PHQ 2/9 Scores 11/26/2020 11/17/2019 09/27/2019 07/20/2019 05/08/2019 04/26/2019 10/06/2018  PHQ - 2 Score 0 0 - 0 0 0 0  PHQ- 9 Score - - - - - - -  Exception Documentation - - Other- indicate reason in comment box - - - -    Fall Risk Fall Risk  11/26/2020 09/06/2020 06/05/2020 04/02/2020 11/17/2019  Falls in the past year? 1 1 0 1 0  Comment - - - - -  Number falls in past yr: 1 1 0 1 -  Injury with Fall? 1 1 0 1 -  Comment - - -  - -  Risk for fall due to : Impaired balance/gait;History of fall(s) History of fall(s);Impaired balance/gait - History of fall(s);Impaired balance/gait -  Follow up Falls evaluation completed Falls evaluation completed Falls evaluation completed Falls evaluation completed -    FALL RISK PREVENTION PERTAINING TO THE HOME: Handrails in use when climbing stairs? Yes Home free of loose throw rugs in walkways, pet beds, electrical cords, etc? Yes  Adequate lighting in your home to reduce risk of falls? Yes   ASSISTIVE DEVICES UTILIZED TO PREVENT FALLS: Life alert? No  Use of a cane, walker or w/c? Yes  Grab bars in the bathroom? Yes  Shower chair or bench in shower? No   Elevated toilet seat or a handicapped toilet? Yes   TIMED UP AND GO: Was the test performed? No . Virtual visit.  Cognitive Function:     6CIT Screen 11/26/2020  What Year? 0 points  What month? 0 points  What time? 0 points  Count back from 20 0 points  Months in reverse 0 points  Repeat phrase 0 points  Total Score 0    Immunizations Immunization History  Administered Date(s) Administered  . Influenza Inj Mdck Quad Pf 08/30/2017  . Influenza Split 08/13/2011, 08/19/2012, 09/18/2013  . Influenza Whole 09/13/2007, 09/12/2008, 08/06/2010  . Influenza,inj,Quad PF,6+ Mos 08/13/2011, 08/19/2012, 09/18/2013, 08/21/2014, 09/18/2014, 09/02/2015, 09/01/2016, 08/30/2017, 08/19/2018, 07/28/2019, 08/06/2020  . Influenza-Unspecified 09/02/2015, 08/30/2017  . PFIZER SARS-COV-2 Vaccination 03/19/2020, 04/09/2020, 11/06/2020  . Pneumococcal Conjugate-13 11/30/2004, 06/04/2014, 09/03/2014  . Pneumococcal Polysaccharide-23 11/30/2004, 09/05/2012  . Td 11/30/2004  . Tdap 09/11/2014, 11/05/2014, 10/19/2016   Health Maintenance Health Maintenance  Topic Date Due  . PAP SMEAR-Modifier  07/01/2021  . MAMMOGRAM  11/15/2022  . COLONOSCOPY (Pts 45-34yrs Insurance coverage will need to be confirmed)  12/17/2024  . TETANUS/TDAP   10/19/2026  . INFLUENZA VACCINE  Completed  . PNEUMOCOCCAL POLYSACCHARIDE VACCINE AGE 52-64 HIGH RISK  Completed  . COVID-19 Vaccine  Completed  . Hepatitis C Screening  Completed  . HIV Screening  Completed  . FOOT EXAM  Discontinued  . HEMOGLOBIN A1C  Discontinued  . OPHTHALMOLOGY EXAM  Discontinued  . URINE MICROALBUMIN  Discontinued   Colorectal cancer screening: Type of screening: Colonoscopy. Completed 12/17/14. Repeat every 10 years  Mammogram status: Completed 11/15/20. Repeat every year. MM 3D SCREEN BREAST BILATERAL   Bone Density- 2016. Osteopenia. Reclast 2018. BMD every 2 years. ([facility]). See care everywhere.   Lung Cancer Screening: (Low Dose CT Chest recommended if Age 29-80 years, 30 pack-year currently smoking OR have quit w/in 15years.) does not qualify.  Hepatitis C Screening: Completed 11/05/17.  Vision Screening: Recommended annual ophthalmology exams for early detection of glaucoma and other disorders of the eye. Is the patient up to date with their annual eye exam?  Yes   Dental Screening: Recommended annual dental exams for proper oral hygiene.  Community Resource Referral / Chronic Care Management: CRR required this visit?  Yes   CCM required this visit?  Yes      Plan:   Keep all routine maintenance appointments.   I have personally reviewed and noted the following in the patient's chart:   . Medical and social history . Use of alcohol, tobacco or illicit drugs  . Current medications and supplements . Functional ability and status . Nutritional status . Physical activity . Advanced directives . List of other physicians . Hospitalizations, surgeries, and ER visits in previous 12 months . Vitals . Screenings to include cognitive, depression, and falls . Referrals and appointments  In addition, I have reviewed and discussed with patient certain preventive protocols, quality metrics, and best practice recommendations. A written personalized care  plan for preventive services as well as general preventive health recommendations were provided to patient via mychart.     Varney Biles, LPN   [id number]

## 2020-12-26 ENCOUNTER — Ambulatory Visit (INDEPENDENT_AMBULATORY_CARE_PROVIDER_SITE_OTHER): Payer: Medicare Other | Admitting: Vascular Surgery

## 2020-12-31 ENCOUNTER — Ambulatory Visit (INDEPENDENT_AMBULATORY_CARE_PROVIDER_SITE_OTHER): Payer: Medicare Other | Admitting: Obstetrics and Gynecology

## 2020-12-31 ENCOUNTER — Other Ambulatory Visit: Payer: Self-pay

## 2020-12-31 ENCOUNTER — Encounter: Payer: Self-pay | Admitting: Obstetrics and Gynecology

## 2020-12-31 VITALS — BP 138/83 | HR 89 | Ht 70.0 in | Wt 211.2 lb

## 2020-12-31 DIAGNOSIS — N952 Postmenopausal atrophic vaginitis: Secondary | ICD-10-CM

## 2020-12-31 DIAGNOSIS — M25551 Pain in right hip: Secondary | ICD-10-CM

## 2020-12-31 DIAGNOSIS — M328 Other forms of systemic lupus erythematosus: Secondary | ICD-10-CM

## 2020-12-31 DIAGNOSIS — Z01419 Encounter for gynecological examination (general) (routine) without abnormal findings: Secondary | ICD-10-CM | POA: Diagnosis not present

## 2020-12-31 DIAGNOSIS — M8580 Other specified disorders of bone density and structure, unspecified site: Secondary | ICD-10-CM

## 2020-12-31 DIAGNOSIS — Z803 Family history of malignant neoplasm of breast: Secondary | ICD-10-CM

## 2020-12-31 DIAGNOSIS — R399 Unspecified symptoms and signs involving the genitourinary system: Secondary | ICD-10-CM

## 2020-12-31 DIAGNOSIS — G20 Parkinson's disease: Secondary | ICD-10-CM

## 2020-12-31 LAB — POCT URINALYSIS DIPSTICK
Bilirubin, UA: NEGATIVE
Blood, UA: NEGATIVE
Glucose, UA: NEGATIVE
Ketones, UA: NEGATIVE
Leukocytes, UA: NEGATIVE
Nitrite, UA: NEGATIVE
Protein, UA: NEGATIVE
Spec Grav, UA: 1.025 (ref 1.010–1.025)
Urobilinogen, UA: 0.2 E.U./dL
pH, UA: 6.5 (ref 5.0–8.0)

## 2020-12-31 NOTE — Progress Notes (Signed)
ANNUAL PREVENTATIVE CARE GYNECOLOGY  ENCOUNTER NOTE  Subjective:       [name] [name] is a 55 y.o. G1P1 female with h/o lupus erythematous and Parkinson's disease here for a routine annual gynecologic exam. The patient is not sexually active. The patient has never been taking hormone replacement therapy. Patient denies post-menopausal vaginal bleeding. The patient wears seatbelts: yes. The patient participates in regular exercise: no. Has the patient ever been transfused or tattooed?: no. The patient reports that there is not currently domestic violence in her life. Does have h/o in the past with ex-husband.   Current complaints: 1.  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 Is working with pain management to decrease her medications.  2. Is noting some blood in urine occasionally.  Notices a pink tinge or a few drops in the commode.  3. Reports an increase in breast cancer in her family. Maternal aunt and maternal cousin with breast cancer. Notes cousin had bilateral mastectomy.  4. States that she has been experiencing right hip pain x 1.5 months.  Unsure if it is due to her lupus or something else. Has an appointment with her Rheumatologist tomorrow. If no findings, will see her Orthopedic doctor.    Gynecologic History Patient's last menstrual period was 01/02/2016 (exact date). Contraception: menopausal Last Pap: 06/2018. Results were: normal.  Last mammogram: 11/15/2020. Results were: normal Last Colonoscopy: 2018.  Results were: benign polyps. Needs repeat in 5 years.  Last Dexa Scan: Never had one   Obstetric History OB History  Gravida Para Term Preterm AB Living  1 1       1   SAB IAB Ectopic Multiple Live Births          1    # Outcome Date GA Lbr Len/2nd Weight Sex Delivery Anes PTL Lv  1 Para 06/19/98    F Vag-Spont   LIV    Past Medical History:  Diagnosis Date  . ADHD   . Anemia   . ANXIETY 06/06/2007  . Arthritis     related to lupus  . Asthma   . BACK PAIN 02/22/2009  . Cervical cancer ([place])    LEEP 2005   . Chicken pox   . Chronic pain    goes to pain management provider  . Collagen vascular disease ([place])   . Connective tissue disease ([place])   . DEPRESSION 06/06/2007  . Essential tremor   . Fibromyalgia   . Fibromyalgia   . FREQUENCY, URINARY 07/07/2007  . Gastritis   . GERD 06/06/2007  . Glaucoma   . Headache    h/o migraines   . History of hiatal hernia   . History of kidney stones   . HYPOTHYROIDISM 06/06/2007  . INTERSTITIAL CYSTITIS 09/11/2010  . Lupus ([place]) 2006  . Lupus ([place])   . Menopause   . Migraine   . Mitral valve regurgitation   . NEPHROLITHIASIS, HX OF 11/05/2010  . Neuropathy   . Neuropathy   . OCD (obsessive compulsive disorder)   . Optic neuritis    2005  . OSTEOPENIA 10/14/2009  . Osteoporosis   . OVERACTIVE BLADDER 10/14/2009  . PAIN, CHRONIC NEC 06/06/2007  . Panniculitis   . Parkinson disease ([place]) 04/03/2019  . POLYARTHRITIS 08/19/2007  . Raynaud's disease   . Sicca syndrome ([place])   . 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 Vaginal prolapse    Dr. Marcelline Mates [facility]   . Vasculitis ([place])   . WEIGHT  GAIN 02/22/2009    Family History  Problem Relation Age of Onset  . Hypertension Mother   . Arthritis Mother   . Heart disease Mother        afib  . Asthma Sister   . Asthma Daughter   . Arthritis Daughter   . Heart disease Daughter        ?heart condition on BB  . ADD / ADHD Daughter   . Pancreatic cancer Father   . Cancer Father        pancreatitic   . Diabetes Maternal Grandmother   . Heart disease Maternal Grandmother   . Arthritis Maternal Grandmother   . Hypertension Maternal Grandmother   . Dementia Maternal Grandmother   . Colon cancer Maternal Uncle 21  . Crohn's disease Maternal Aunt   . Breast cancer Maternal Aunt 65  . Diabetes Maternal Aunt   . Breast cancer Cousin 82       maternal  . Cancer Cousin        m cousin breat cancer s/p removal  both breasts     Past Surgical History:  Procedure Laterality Date  . APPENDECTOMY    . CERVICAL BIOPSY  W/ LOOP ELECTRODE EXCISION  2005  . CHOLECYSTECTOMY    . ESOPHAGOGASTRODUODENOSCOPY (EGD) WITH PROPOFOL N/A 08/27/2017   Procedure: ESOPHAGOGASTRODUODENOSCOPY (EGD) WITH PROPOFOL;  Surgeon: Manya Silvas, MD;  Location: [facility] ENDOSCOPY;  Service: Endoscopy;  Laterality: N/A;  . HAND SURGERY     repair of left metatarsal   . HARDWARE REMOVAL Right 02/16/2017   Procedure: HARDWARE REMOVAL FROM HIP;  Surgeon: Hessie Knows, MD;  Location: [facility] ORS;  Service: Orthopedics;  Laterality: Right;  . JOINT REPLACEMENT    . OTHER SURGICAL HISTORY     left 3rd metatarsal fracture repair 2011   . REVISION TOTAL HIP ARTHROPLASTY  06/2009   replacement 2010 then screw and plate removal [id number]; right hip  . TONSILLECTOMY    . TONSILLECTOMY    . wrist  ligament repair bilateral    . WRIST RECONSTRUCTION    . WRIST SURGERY     laceration of right wrist ligaments  . wrist surgery     laceration of left wrist surgery     Social History   Socioeconomic History  . Marital status: Single    Spouse name: Not on file  . Number of children: 1  . Years of education: Not on file  . Highest education level: Not on file  Occupational History  . Occupation: DISABLED    Employer: UNEMPLOYED  Tobacco Use  . Smoking status: Never Smoker  . Smokeless tobacco: Never Used  Vaping Use  . Vaping Use: Never used  Substance and Sexual Activity  . Alcohol use: No  . Drug use: No  . Sexual activity: Not Currently    Birth control/protection: None, Post-menopausal  Other Topics Concern  . 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 Difficulty of Paying Living Expenses: Not hard at all  Food Insecurity: No  Food Insecurity  . Worried About Charity fundraiser in the Last Year: Never true  . Ran Out of Food in the Last Year: Never true  Transportation Needs: No Transportation Needs  . Lack of Transportation (  Medical): No  . Lack of Transportation (Non-Medical): No  Physical Activity: Not on file  Stress: No Stress Concern Present  . Feeling of Stress : Not at all  Social Connections: Unknown  . Frequency of Communication with Friends and Family: More than three times a week  . Frequency of Social Gatherings with Friends and Family: Not on file  . Attends Religious Services: Not on file  . Active Member of Clubs or Organizations: Not on file  . Attends Archivist Meetings: Not on file  . Marital Status: Not on file  Intimate Partner Violence: Not At Risk  . Fear of Current or Ex-Partner: No  . Emotionally Abused: No  . Physically Abused: No  . Sexually Abused: No    Current Outpatient Medications on File Prior to Visit  Medication Sig Dispense Refill  . acetaminophen (TYLENOL) 650 MG CR tablet Take 650 mg by mouth every 8 (eight) hours as needed for pain.    Marland Kitchen albuterol (VENTOLIN HFA) 108 (90 Base) MCG/ACT inhaler Inhale 1-2 puffs into the lungs every 4 (four) hours as needed for wheezing or shortness of breath. 18 g 11  . ARIPiprazole (ABILIFY) 20 MG tablet Take 20 mg by mouth daily.    . Ascorbic Acid (VITAMIN C) 500 MG CHEW Chew 2,500 mg by mouth daily.    Marland Kitchen aspirin 325 MG tablet Take 1 tablet (325 mg total) by mouth daily. 30 tablet 0  . Biotin 10 MG CAPS Take by mouth.    . Biotin 5000 MCG SUBL Place 10,000 mcg under the tongue daily.    . carbidopa-levodopa (SINEMET IR) 10-100 MG tablet Take 0.5 tablets by mouth 3 (three) times daily.    . Cholecalciferol (VITAMIN D3) 2000 units capsule Vitamin D3 2,000 unit capsule    . dapsone 25 MG tablet Take by mouth daily.     Marland Kitchen dexmethylphenidate (FOCALIN XR) 20 MG 24 hr capsule dexmethylphenidate ER 20 mg capsule,extended release  [id number]    . diclofenac sodium (VOLTAREN) 1 % GEL Apply 2 g topically 4 (four) times daily as needed (for knee & finger pain.).     Marland Kitchen dicyclomine (BENTYL) 10 MG capsule dicyclomine 10 mg capsule  every 8hours as needed    . DULoxetine (CYMBALTA) 60 MG capsule Take 60 mg by mouth 2 (two) times daily.    Water engineer Bandages & Supports (MEDICAL COMPRESSION STOCKINGS) MISC Please provide 78mmHg compression stockings 1 each 0  . fentaNYL (DURAGESIC - DOSED MCG/HR) 75 MCG/HR Place 75 mcg onto the skin every 3 (three) days.    . Ferrous Sulfate (IRON PO) Take by mouth.    . fluocinonide ointment (LIDEX) 0.05 % fluocinonide 0.05 % topical ointment  APPLY TO THE AFFECTED AREA(S) BY TOPICAL ROUTE 2 TIMES PER DAY    . Fluticasone-Salmeterol (ADVAIR DISKUS) 250-50 MCG/DOSE AEPB Inhale 1 puff into the lungs 2 (two) times daily. Rinse mouth 1 each 12  . folic acid (FOLVITE) 1 MG tablet Take 1 tablet (1 mg total) by mouth daily. 90 tablet 3  . furosemide (LASIX) 20 MG tablet Take 20 mg by mouth daily as needed.     . hydrocortisone (ANUSOL-HC) 2.5 % rectal cream Proctozone-HC 2.5 % topical cream perineal applicator    . hydroxychloroquine (PLAQUENIL) 200 MG tablet Plaquenil 200 mg tablet  Take 1 tablet twice a day by oral route after meals for 90 days.    Marland Kitchen ibuprofen (ADVIL,MOTRIN) 800 MG tablet Take 1 tablet (800 mg total) by  mouth every 8 (eight) hours as needed. 30 tablet 0  . imiquimod (ALDARA) 5 % cream imiquimod 5 % topical cream packet  APPLY TO THE AFFECTED AREA(S) BY TOPICAL ROUTE 3 TIMES PER WEEK at bedtime    . isosorbide mononitrate (IMDUR) 60 MG 24 hr tablet Take 1 tablet (60 mg total) by mouth daily. 20 tablet 0  . levothyroxine (SYNTHROID) 75 MCG tablet Take 1 tablet (75 mcg total) by mouth daily before breakfast. 30 minutes before food 90 tablet 3  . LORazepam (ATIVAN) 2 MG tablet Take 1-2 mg by mouth See admin instructions. 1 MG DAILY AS NEEDED FOR ANXIETY & SCHEDULED AT BEDTIME EACH  NIGHT    . methotrexate 250 MG/10ML injection Inject into the skin once a week.     . methylphenidate 18 MG PO CR tablet Take by mouth.     . montelukast (SINGULAIR) 10 MG tablet montelukast 10 mg tablet 1 pill qhs 90 tablet 3  . Multiple Vitamins-Minerals (MULTI-VITAMIN GUMMIES PO) Multi Vitamin  GUMMIES    . omeprazole (PRILOSEC) 40 MG capsule Take 40 mg by mouth 2 (two) times daily.    . ondansetron (ZOFRAN) 4 MG tablet Take 1 tablet (4 mg total) by mouth every 8 (eight) hours as needed for nausea or vomiting. 40 tablet 1  . PARoxetine (PAXIL) 10 MG tablet TAKE 1 TABLET (10 MG TOTAL) BY MOUTH AT BEDTIME. 90 tablet 0  . polyethylene glycol powder (GLYCOLAX/MIRALAX) powder Take 17 g by mouth daily. 255 g 0  . Polyvinyl Alcohol-Povidone (REFRESH OP) Place 1 drop into both eyes 2 (two) times daily.    . predniSONE (DELTASONE) 10 MG tablet prednisone 10 mg tablet  1 daily    . Probiotic Product (PROBIOTIC PO) Probiotic 10 billion cell capsule  daily    . Sodium Chloride 3 % AERS Saline Nasal Mist 3 %  spray nose deeply every hour or two and blow nose    . sucralfate (CARAFATE) 1 g tablet Take 1 tablet (1 g total) by mouth 4 (four) times daily -  with meals and at bedtime. Dissolve in 8 ounces of liquid 360 tablet 3  . terbinafine (LAMISIL AT) 1 % cream Apply 1 application topically 2 (two) times daily. 42 g 2  . tiZANidine (ZANAFLEX) 4 MG capsule Take by mouth.    . topiramate (TOPAMAX) 100 MG tablet 100-150 mg. 100 in am and 150 qhs    . triamcinolone ointment (KENALOG) 0.5 % Apply 1 application topically daily. 30 g 0  . valACYclovir (VALTREX) 500 MG tablet valacyclovir 500 mg tablet  1 daily     No current facility-administered medications on file prior to visit.    Allergies  Allergen Reactions  . Bee Venom Itching and Swelling    Affected area Affected area Affected area Affected area   . Fish Allergy Hives, Swelling and Rash    Facial swelling  Facial swelling  Facial  swelling Facial swelling  Facial swelling   . Latex Anaphylaxis, Rash and Shortness Of Breath    Rash  Rash    . Prasterone Other (See Comments) and Nausea And Vomiting    rash Other reaction(s): Headache Headaches.  . Shellfish Allergy Hives, Other (See Comments), Rash and Swelling    Facial swelling Uncoded Allergy. Allergen: seafood Uncoded Allergy. Allergen: CATS, Other Reaction: itch, wheezing Uncoded Allergy. Allergen: COMPAZINE, Other Reaction: tremors Facial swelling Facial swelling Uncoded Allergy. Allergen: seafood Uncoded Allergy. Allergen: CATS, Other Reaction: itch, wheezing Uncoded Allergy. Allergen:  COMPAZINE, Other Reaction: tremors Facial swelling Uncoded Allergy. Allergen: seafood Uncoded Allergy. Allergen: CATS, Other Reaction: itch, wheezing Uncoded Allergy. Allergen: COMPAZINE, Other Reaction: tremors Facial swelling Facial swelling Uncoded Allergy. Allergen: seafood Uncoded Allergy. Allergen: CATS, Other Reaction: itch, wheezing Uncoded Allergy. Allergen: COMPAZINE, Other Reaction: tremors Facial swelling Facial swelling Uncoded Allergy. Allergen: seafood Uncoded Allergy. Allergen: CATS, Other Reaction: itch, wheezing Uncoded Allergy. Allergen: COMPAZINE, Other Reaction: tremors   . Shellfish-Derived Products Hives, Other (See Comments), Rash and Swelling    Facial swelling Uncoded Allergy. Allergen: seafood Uncoded Allergy. Allergen: CATS, Other Reaction: itch, wheezing Uncoded Allergy. Allergen: COMPAZINE, Other Reaction: tremors Facial swelling Facial swelling Uncoded Allergy. Allergen: seafood Uncoded Allergy. Allergen: CATS, Other Reaction: itch, wheezing Uncoded Allergy. Allergen: COMPAZINE, Other Reaction: tremors Facial swelling Uncoded Allergy. Allergen: seafood Uncoded Allergy. Allergen: CATS, Other Reaction: itch, wheezing Uncoded Allergy. Allergen: COMPAZINE, Other Reaction: tremors Facial swelling  . Compazine [Prochlorperazine  Edisylate] Other (See Comments)    tremors  . Dhea [Nutritional Supplements] Other (See Comments)    Headaches.  . Dilaudid [Hydromorphone Hcl]     ? reaction  . Famotidine Other (See Comments) and Diarrhea    Other reaction(s): Headache Gas  . Lactose   . Lactose Intolerance (Gi)   . Other Other (See Comments)    Other reaction(s): Unknown   . Prochlorperazine     Other reaction(s): Other (See Comments) ticks  . Promethazine     Other reaction(s): Other (See Comments) Ticks  . Promethazine Hcl Other (See Comments)    CNS disorder  . Reclast [Zoledronic Acid]     Weakness could not move limbs, fatigue, increase sleep  . Clarithromycin Hives and Rash  . Clotrimazole Swelling and Rash  . Hydromorphone Hcl Rash and Hives    "Rash all over"  . Penicillins Rash and Other (See Comments)    Has patient had a PCN reaction causing immediate rash, facial/tongue/throat swelling, SOB or lightheadedness with hypotension:No Has patient had a PCN reaction causing severe rash involving mucus membranes or skin necrosis:No Has patient had a PCN reaction that required hospitalization:No Has patient had a PCN reaction occurring within the last 10 years:No If all of the above answers are "NO", then may proceed with Cephalosporin use.      Review of Systems ROS Review of Systems - General ROS: negative for - chills, fatigue, fever, night sweats, weight gain or weight loss.  Psychological ROS: negative for - anxiety, decreased libido, depression, mood swings, physical abuse or sexual abuse Ophthalmic ROS: negative for - blurry vision, eye pain or loss of vision ENT ROS: negative for - headaches, hearing change, visual changes or vocal changes Allergy and Immunology ROS: negative for - hives, itchy/watery eyes or seasonal allergies Hematological and Lymphatic ROS: negative for - bleeding problems, bruising, swollen lymph nodes or weight loss Endocrine ROS: negative for - galactorrhea, hot  flashes, malaise/lethargy, mood swings, palpitations, polydipsia/polyuria, skin changes, temperature intolerance or unexpected weight changes.  Breast ROS: negative for - new or changing breast lumps or nipple discharge Respiratory ROS: negative for - cough or shortness of breath Cardiovascular ROS: negative for - chest pain, irregular heartbeat, palpitations or shortness of breath Gastrointestinal ROS: no abdominal pain, change in bowel habits, or black or bloody stools Genito-Urinary ROS: no dysuria, trouble voiding, or hematuria Musculoskeletal ROS: negative for - joint pain or joint stiffness Neurological ROS: negative for - bowel and bladder control changes Dermatological ROS: negative for rash and skin lesion changes   Objective:  BP 138/83   Pulse 89   Ht 5\' 10"  (1.778 m)   Wt 211 lb 3.2 oz (95.8 kg)   LMP 01/02/2016 (Exact Date)   BMI 30.30 kg/m  CONSTITUTIONAL: Well-developed, well-nourished female in no acute distress. Mild obesity PSYCHIATRIC: Normal mood and affect. Normal behavior. Normal judgment and thought content. [place]: Alert and oriented to person, place, and time. No cranial nerve deficit noted.  Mild body tremor present.  HENT:  Normocephalic, atraumatic, External right and left ear normal. Oropharynx is clear and moist EYES: Conjunctivae and EOM are normal. Pupils are equal, round, and reactive to light. No scleral icterus.  NECK: Normal range of motion, supple, no masses.  Normal thyroid.  SKIN: Skin is warm and dry. No rash noted. Not diaphoretic. No erythema. No pallor. CARDIOVASCULAR: Normal heart rate noted, regular rhythm, no murmur. RESPIRATORY: Clear to auscultation bilaterally. Effort and breath sounds normal, no problems with respiration noted. BREASTS: Symmetric in size. No masses, skin changes, nipple drainage, or lymphadenopathy. ABDOMEN: Soft, normal bowel sounds, no distention noted.  No tenderness, rebound or guarding.  BLADDER:  Normal PELVIC:  Bladder no bladder distension noted  Urethra: normal appearing urethra with no masses, tenderness or lesions  Vulva: normal appearing vulva with no masses, tenderness or lesions  Vagina: atrophic and Pelvic Floor Exam no cystocele, rectocele or prolapse noted, cystocele mild, present  Cervix: normal appearing cervix without discharge or lesions  Uterus: uterus is normal size, shape, consistency and nontender  Adnexa: normal adnexa in size, nontender and no masses  RV: External Exam NormaI, No Rectal Masses and Normal Sphincter tone  MUSCULOSKELETAL: Normal range of motion. No tenderness.  No cyanosis, clubbing, or edema.  2+ distal pulses. LYMPHATIC: No Axillary, Supraclavicular, or Inguinal Adenopathy.   Labs:  Results for orders placed or performed in visit on 12/31/20  POCT urinalysis dipstick  Result Value Ref Range   Color, UA [place]    Clarity, UA clear    Glucose, UA Negative Negative   Bilirubin, UA neg    Ketones, UA neg    Spec Grav, UA 1.025 1.010 - 1.025   Blood, UA neg    pH, UA 6.5 5.0 - 8.0   Protein, UA Negative Negative   Urobilinogen, UA 0.2 0.2 or 1.0 E.U./dL   Nitrite, UA neg    Leukocytes, UA Negative Negative   Appearance [place];clear    Odor      Lab Results  Component Value Date   WBC 4.4 11/21/2020   HGB 11.8 (L) 11/21/2020   HCT 37.0 11/21/2020   MCV 90.9 11/21/2020   PLT 143 (L) 11/21/2020    Lab Results  Component Value Date   CREATININE 0.84 11/21/2020   BUN 12 11/21/2020   NA 141 11/21/2020   K 4.0 11/21/2020   CL 107 11/21/2020   CO2 26 11/21/2020    Lab Results  Component Value Date   ALT 19 04/17/2020   AST 20 04/17/2020   ALKPHOS 87 04/17/2020   BILITOT 0.6 04/17/2020    Lab Results  Component Value Date   CHOL 196 01/16/2020   HDL 65.90 01/16/2020   LDLCALC 111 (H) 01/16/2020   TRIG 91.0 01/16/2020   CHOLHDL 3 01/16/2020    Lab Results  Component Value Date   TSH 6.67 (H) 04/02/2020    Lab  Results  Component Value Date   HGBA1C 5.3 07/06/2018     Assessment:   Encounter for well woman exam with routine gynecological exam.  Parkinson disease ([place]) Other forms of systemic lupus erythematosus, unspecified organ involvement status ([place]) Osteopenia, unspecified location Vaginal atrophy Family history of breast cancer UTI symptoms Right hip pain  Plan:  - Pap: Pap Co Test  Up to date.  - Mammogram: Up to date.  Continue routine screening - Stool Guaiac Testing:  Not Indicated. Patient has had recent colonoscopy last year, repeat due in another 2 years.  - Labs: No new labs. Labs performed with PCP.   - Routine preventative health maintenance measures emphasized: Exercise/Diet/Weight control and Stress Management - Vaginal atrophy noted, likely source of bleeding as UA today is normal. However will still f/u with urine culture. If negative, can discuss options for management.  - Parkinson's and Lupus managed by respective specialists.  - Osteopenia, patient eceives Reclast dosing annually.  - Discussion had with patient regarding concerns for family history of cancer. She desires hereditary genetic screening. Will order [facility].  - Right hip pain. Plans to see Rheumatologist (and possibly Orthopedic Surgery) for evaluation.  Return to [place], MD [facility]

## 2020-12-31 NOTE — Progress Notes (Signed)
Pt present for annual exam. Pt stated that she having blood in her urine, UA completed and documented. l and would like to get [facility] for genetic testing.

## 2020-12-31 NOTE — Patient Instructions (Signed)
Preventive Care 84-57 Years Old, Female Preventive care refers to lifestyle choices and visits with your health care provider that can promote health and wellness. This includes:  A yearly physical exam. This is also called an annual wellness visit.  Regular dental and eye exams.  Immunizations.  Screening for certain conditions.  Healthy lifestyle choices, such as: ? Eating a healthy diet. ? Getting regular exercise. ? Not using drugs or products that contain nicotine and tobacco. ? Limiting alcohol use. What can I expect for my preventive care visit? Physical exam Your health care provider will check your:  Height and weight. These may be used to calculate your BMI (body mass index). BMI is a measurement that tells if you are at a healthy weight.  Heart rate and blood pressure.  Body temperature.  Skin for abnormal spots. Counseling Your health care provider may ask you questions about your:  Past medical problems.  Family's medical history.  Alcohol, tobacco, and drug use.  Emotional well-being.  Home life and relationship well-being.  Sexual activity.  Diet, exercise, and sleep habits.  Work and work Statistician.  Access to firearms.  Method of birth control.  Menstrual cycle.  Pregnancy history. What immunizations do I need? Vaccines are usually given at various ages, according to a schedule. Your health care provider will recommend vaccines for you based on your age, medical history, and lifestyle or other factors, such as travel or where you work.   What tests do I need? Blood tests  Lipid and cholesterol levels. These may be checked every 5 years, or more often if you are over 3 years old.  Hepatitis C test.  Hepatitis B test. Screening  Lung cancer screening. You may have this screening every year starting at age 73 if you have a 30-pack-year history of smoking and currently smoke or have quit within the past 15 years.  Colorectal cancer  screening. ? All adults should have this screening starting at age 52 and continuing until age 17. ? Your health care provider may recommend screening at age 49 if you are at increased risk. ? You will have tests every 1-10 years, depending on your results and the type of screening test.  Diabetes screening. ? This is done by checking your blood sugar (glucose) after you have not eaten for a while (fasting). ? You may have this done every 1-3 years.  Mammogram. ? This may be done every 1-2 years. ? Talk with your health care provider about when you should start having regular mammograms. This may depend on whether you have a family history of breast cancer.  BRCA-related cancer screening. This may be done if you have a family history of breast, ovarian, tubal, or peritoneal cancers.  Pelvic exam and Pap test. ? This may be done every 3 years starting at age 10. ? Starting at age 11, this may be done every 5 years if you have a Pap test in combination with an HPV test. Other tests  STD (sexually transmitted disease) testing, if you are at risk.  Bone density scan. This is done to screen for osteoporosis. You may have this scan if you are at high risk for osteoporosis. Talk with your health care provider about your test results, treatment options, and if necessary, the need for more tests. Follow these instructions at home: Eating and drinking  Eat a diet that includes fresh fruits and vegetables, whole grains, lean protein, and low-fat dairy products.  Take vitamin and mineral supplements  as recommended by your health care provider.  Do not drink alcohol if: ? Your health care provider tells you not to drink. ? You are pregnant, may be pregnant, or are planning to become pregnant.  If you drink alcohol: ? Limit how much you have to 0-1 drink a day. ? Be aware of how much alcohol is in your drink. In the [place] one drink equals one 12 oz bottle of beer (355 mL), one 5 oz glass of  wine (148 mL), or one 1 oz glass of hard liquor (44 mL).   Lifestyle  Take daily care of your teeth and gums. Brush your teeth every morning and night with fluoride toothpaste. Floss one time each day.  Stay active. Exercise for at least 30 minutes 5 or more days each week.  Do not use any products that contain nicotine or tobacco, such as cigarettes, e-cigarettes, and chewing tobacco. If you need help quitting, ask your health care provider.  Do not use drugs.  If you are sexually active, practice safe sex. Use a condom or other form of protection to prevent STIs (sexually transmitted infections).  If you do not wish to become pregnant, use a form of birth control. If you plan to become pregnant, see your health care provider for a prepregnancy visit.  If told by your health care provider, take low-dose aspirin daily starting at age 44.  Find healthy ways to cope with stress, such as: ? Meditation, yoga, or listening to music. ? Journaling. ? Talking to a trusted person. ? Spending time with friends and family. Safety  Always wear your seat belt while driving or riding in a vehicle.  Do not drive: ? If you have been drinking alcohol. Do not ride with someone who has been drinking. ? When you are tired or distracted. ? While texting.  Wear a helmet and other protective equipment during sports activities.  If you have firearms in your house, make sure you follow all gun safety procedures. What's next?  Visit your health care provider once a year for an annual wellness visit.  Ask your health care provider how often you should have your eyes and teeth checked.  Stay up to date on all vaccines. This information is not intended to replace advice given to you by your health care provider. Make sure you discuss any questions you have with your health care provider. Document Revised: 08/20/2020 Document Reviewed: 07/28/2018 Elsevier Patient Education  2021 [place].     Atrophic Vaginitis  Atrophic vaginitis is when the lining of the vagina becomes dry and thin. This is most common in women who have stopped having their periods (are in menopause). It usually starts when a woman is 53 to 57 years old. What are the causes? This condition is caused by a drop in a female hormone (estrogen). What increases the risk? You are more likely to develop this condition if:  You take certain medicines.  You have had your ovaries taken out.  You are being treated for cancer.  You have given birth or are breastfeeding.  You are more than 57 years old.  You smoke. What are the signs or symptoms?  Pain during sex.  A feeling of pressure during sex.  Bleeding during sex.  Burning or itching in the vagina.  Burning pain when you pee (urinate).  Fluid coming from your vagina. Some people do not have symptoms. How is this treated?  Using a lubricant before sex.  Using  a moisturizer in the vagina.  Using estrogen in the vagina. In some cases, you may not need treatment. Follow these instructions at home: Medicines  Take all medicines only as told by your doctor. This includes medicines for dryness.  Do not use herbal medicines unless your doctor says it is okay. General instructions  Talk with your doctor about treatment.  Do not douche.  Do not use scented: ? Sprays. ? Tampons. ? Soaps.  If sex hurts, try using lubricants right before you have sex. Contact a doctor if:  You have fluid coming from the vagina that is not like normal.  You have a bad smell coming from your vagina.  You have new symptoms.  Your symptoms do not get better when treated.  Your symptoms get worse. Summary  This condition happens when the lining of the vagina becomes dry and thin.  It is most common in women who no longer have periods.  Treatment may include using medicines for dryness.  Call a doctor if your symptoms do not get  better. This information is not intended to replace advice given to you by your health care provider. Make sure you discuss any questions you have with your health care provider. Document Revised: 05/16/2020 Document Reviewed: 05/16/2020 Elsevier Patient Education  2021 [place] Breast self-awareness means being familiar with how your breasts look and feel. It involves checking your breasts regularly and reporting any changes to your health care provider. Practicing breast self-awareness is important. Sometimes changes may not be harmful (are benign), but sometimes a change in your breasts can be a sign of a serious medical problem. It is important to learn how to do this procedure correctly so that you can catch problems early, when treatment is more likely to be successful. All women should practice breast self-awareness, including women who have had breast implants. What you need:  A mirror.  A well-lit room. How to do a breast self-exam A breast self-exam is one way to learn what is normal for your breasts and whether your breasts are changing. To do a breast self-exam: Look for changes 1. Remove all the clothing above your waist. 2. Stand in front of a mirror in a room with good lighting. 3. Put your hands on your hips. 4. Push your hands firmly downward. 5. Compare your breasts in the mirror. Look for differences between them (asymmetry), such as: ? Differences in shape. ? Differences in size. ? Puckers, dips, and bumps in one breast and not the other. 6. Look at each breast for changes in the skin, such as: ? Redness. ? Scaly areas. 7. Look for changes in your nipples, such as: ? Discharge. ? Bleeding. ? Dimpling. ? Redness. ? A change in position.   Feel for changes Carefully feel your breasts for lumps and changes. It is best to do this while lying on your back on the floor, and again while sitting or standing in the tub or shower with soapy  water on your skin. Feel each breast in the following way: 1. Place the arm on the side of the breast you are examining above your head. 2. Feel your breast with the other hand. 3. Start in the nipple area and make -inch (2 cm) overlapping circles to feel your breast. Use the pads of your three middle fingers to do this. Apply light pressure, then medium pressure, then firm pressure. The light pressure will allow you to feel the tissue closest to the  skin. The medium pressure will allow you to feel the tissue that is a little deeper. The firm pressure will allow you to feel the tissue close to the ribs. 4. Continue the overlapping circles, moving downward over the breast until you feel your ribs below your breast. 5. Move one finger-width toward the center of the body. Continue to use the -inch (2 cm) overlapping circles to feel your breast as you move slowly up toward your collarbone. 6. Continue the up-and-down exam using all three pressures until you reach your armpit.   Write down what you find Writing down what you find can help you remember what to discuss with your health care provider. Write down:  What is normal for each breast.  Any changes that you find in each breast, including: ? The kind of changes you find. ? Any pain or tenderness. ? Size and location of any lumps.  Where you are in your menstrual cycle, if you are still menstruating. General tips and recommendations  Examine your breasts every month.  If you are breastfeeding, the best time to examine your breasts is after a feeding or after using a breast pump.  If you menstruate, the best time to examine your breasts is 5-7 days after your period. Breasts are generally lumpier during menstrual periods, and it may be more difficult to notice changes.  With time and practice, you will become more familiar with the variations in your breasts and more comfortable with the exam. Contact a health care provider if you:  See  a change in the shape or size of your breasts or nipples.  See a change in the skin of your breast or nipples, such as a reddened or scaly area.  Have unusual discharge from your nipples.  Find a lump or thick area that was not there before.  Have pain in your breasts.  Have any concerns related to your breast health. Summary  Breast self-awareness includes looking for physical changes in your breasts, as well as feeling for any changes within your breasts.  Breast self-awareness should be performed in front of a mirror in a well-lit room.  You should examine your breasts every month. If you menstruate, the best time to examine your breasts is 5-7 days after your menstrual period.  Let your health care provider know of any changes you notice in your breasts, including changes in size, changes on the skin, pain or tenderness, or unusual fluid from your nipples. This information is not intended to replace advice given to you by your health care provider. Make sure you discuss any questions you have with your health care provider. Document Revised: 07/05/2018 Document Reviewed: 07/05/2018 Elsevier Patient Education  [place].

## 2021-01-02 LAB — URINE CULTURE: Organism ID, Bacteria: NO GROWTH

## 2021-01-09 ENCOUNTER — Ambulatory Visit (INDEPENDENT_AMBULATORY_CARE_PROVIDER_SITE_OTHER): Payer: Medicare Other | Admitting: Vascular Surgery

## 2021-03-12 ENCOUNTER — Encounter: Payer: Self-pay | Admitting: Internal Medicine

## 2021-03-12 ENCOUNTER — Telehealth: Payer: Self-pay

## 2021-03-12 ENCOUNTER — Other Ambulatory Visit: Payer: Self-pay | Admitting: Obstetrics and Gynecology

## 2021-03-12 DIAGNOSIS — N951 Menopausal and female climacteric states: Secondary | ICD-10-CM

## 2021-03-13 NOTE — Telephone Encounter (Signed)
error 

## 2021-03-13 NOTE — Telephone Encounter (Signed)
Please advise. Thanks Braxdon Gappa 

## 2021-03-18 ENCOUNTER — Encounter: Payer: Self-pay | Admitting: Internal Medicine

## 2021-03-19 ENCOUNTER — Encounter: Payer: Self-pay | Admitting: Internal Medicine

## 2021-03-20 ENCOUNTER — Other Ambulatory Visit: Payer: Self-pay | Admitting: Internal Medicine

## 2021-03-20 DIAGNOSIS — J309 Allergic rhinitis, unspecified: Secondary | ICD-10-CM

## 2021-03-20 MED ORDER — MONTELUKAST SODIUM 10 MG PO TABS: ORAL_TABLET | ORAL | 3 refills | Status: DC

## 2021-03-20 MED ORDER — ALBUTEROL SULFATE HFA 108 (90 BASE) MCG/ACT IN AERS: 1.0000 | INHALATION_SPRAY | RESPIRATORY_TRACT | 11 refills | Status: DC | PRN

## 2021-03-21 ENCOUNTER — Other Ambulatory Visit: Payer: Self-pay | Admitting: Internal Medicine

## 2021-03-21 DIAGNOSIS — E039 Hypothyroidism, unspecified: Secondary | ICD-10-CM

## 2021-03-21 NOTE — Telephone Encounter (Signed)
Please advise. Thanks Lyzbeth Genrich 

## 2021-03-24 NOTE — Telephone Encounter (Signed)
I refilled this 2 weeks ago.  Not sure what the issue is. May need to contact the pharmacy.

## 2021-03-25 NOTE — Telephone Encounter (Signed)
Please advise did you place a written order on my desk? Patient's daughter will be seen in office today

## 2021-03-25 NOTE — Telephone Encounter (Signed)
Order found and updated. Being held on my desk for Patient's daughter's appointment today at 10:30

## 2021-03-26 NOTE — Addendum Note (Signed)
Addended by: Orland Mustard on: 03/26/2021 09:06 PM   Modules accepted: Orders

## 2021-04-07 ENCOUNTER — Telehealth: Payer: Self-pay

## 2021-04-07 NOTE — Telephone Encounter (Signed)
Pt called about Rheumatology referral. Please advise

## 2021-04-23 ENCOUNTER — Telehealth: Payer: Self-pay | Admitting: Internal Medicine

## 2021-04-23 NOTE — Telephone Encounter (Signed)
Patient states that rheumatology referral to Dr Barbaraann Barthel office was denied. Patient states this was denied due to her not meeting their criteria. Patient last saw another rheumatologist who states she does not have Lupus when Patient has had diagnosis since early 2000's.   Patient needing a new referral. Patient is in office with daughter today. Dr Olivia Mackie McLean-Scocuzza states Patient will need to contact insurance to see who else is covered.   Will inform Patient during daughter's appointment.

## 2021-04-23 NOTE — Addendum Note (Signed)
Addended by: Orland Mustard on: 04/23/2021 02:43 PM   Modules accepted: Orders

## 2021-04-23 NOTE — Telephone Encounter (Signed)
Will refer  [facility] then  [facility] Dr. Mack[facility] rheumag[facility] Dr. Felipe Drone rheumatology

## 2021-05-06 DIAGNOSIS — W07XXXA Fall from chair, initial encounter: Secondary | ICD-10-CM | POA: Insufficient documentation

## 2021-05-06 DIAGNOSIS — Z8541 Personal history of malignant neoplasm of cervix uteri: Secondary | ICD-10-CM | POA: Diagnosis not present

## 2021-05-06 DIAGNOSIS — E039 Hypothyroidism, unspecified: Secondary | ICD-10-CM | POA: Diagnosis not present

## 2021-05-06 DIAGNOSIS — Z79899 Other long term (current) drug therapy: Secondary | ICD-10-CM | POA: Diagnosis not present

## 2021-05-06 DIAGNOSIS — Z96641 Presence of right artificial hip joint: Secondary | ICD-10-CM | POA: Diagnosis not present

## 2021-05-06 DIAGNOSIS — J45909 Unspecified asthma, uncomplicated: Secondary | ICD-10-CM | POA: Diagnosis not present

## 2021-05-06 DIAGNOSIS — Z7982 Long term (current) use of aspirin: Secondary | ICD-10-CM | POA: Insufficient documentation

## 2021-05-06 DIAGNOSIS — G20 Parkinson's disease: Secondary | ICD-10-CM | POA: Insufficient documentation

## 2021-05-06 DIAGNOSIS — Z91040 Latex allergy status: Secondary | ICD-10-CM | POA: Insufficient documentation

## 2021-05-06 DIAGNOSIS — R1031 Right lower quadrant pain: Secondary | ICD-10-CM | POA: Insufficient documentation

## 2021-05-06 DIAGNOSIS — M25551 Pain in right hip: Secondary | ICD-10-CM | POA: Insufficient documentation

## 2021-05-06 DIAGNOSIS — Y9389 Activity, other specified: Secondary | ICD-10-CM | POA: Insufficient documentation

## 2021-05-06 DIAGNOSIS — Y92002 Bathroom of unspecified non-institutional (private) residence single-family (private) house as the place of occurrence of the external cause: Secondary | ICD-10-CM | POA: Diagnosis not present

## 2021-05-07 ENCOUNTER — Other Ambulatory Visit: Payer: Self-pay

## 2021-05-07 ENCOUNTER — Emergency Department: Payer: Medicare Other

## 2021-05-07 ENCOUNTER — Encounter: Payer: Self-pay | Admitting: *Deleted

## 2021-05-07 ENCOUNTER — Emergency Department
Admission: EM | Admit: 2021-05-07 | Discharge: 2021-05-07 | Disposition: A | Discharge status: Home / Self Care | Payer: Medicare Other | Attending: Emergency Medicine | Admitting: Emergency Medicine

## 2021-05-07 DIAGNOSIS — M25551 Pain in right hip: Secondary | ICD-10-CM

## 2021-05-07 DIAGNOSIS — R1031 Right lower quadrant pain: Secondary | ICD-10-CM

## 2021-05-07 DIAGNOSIS — W19XXXA Unspecified fall, initial encounter: Secondary | ICD-10-CM

## 2021-05-07 LAB — BASIC METABOLIC PANEL
Anion gap: 8 (ref 5–15)
BUN: 16 mg/dL (ref 6–20)
CO2: 24 mmol/L (ref 22–32)
Calcium: 8.7 mg/dL — ABNORMAL LOW (ref 8.9–10.3)
Chloride: 109 mmol/L (ref 98–111)
Creatinine, Ser: 0.81 mg/dL (ref 0.44–1.00)
GFR, Estimated: >60 mL/min (ref >60)
Glucose, Bld: 120 mg/dL — ABNORMAL HIGH (ref 70–99)
Potassium: 4.1 mmol/L (ref 3.5–5.1)
Sodium: 141 mmol/L (ref 135–145)

## 2021-05-07 LAB — CBC WITH DIFFERENTIAL/PLATELET
Abs Immature Granulocytes: 0.01 10*3/uL (ref 0.00–0.07)
Basophils Absolute: 0 10*3/uL (ref 0.0–0.1)
Basophils Relative: 0 %
Eosinophils Absolute: 0.2 10*3/uL (ref 0.0–0.5)
Eosinophils Relative: 3 %
HCT: 34.5 % — ABNORMAL LOW (ref 36.0–46.0)
Hemoglobin: 11.1 g/dL — ABNORMAL LOW (ref 12.0–15.0)
Immature Granulocytes: 0 %
Lymphocytes Relative: 36 %
Lymphs Abs: 1.7 10*3/uL (ref 0.7–4.0)
MCH: 29.5 pg (ref 26.0–34.0)
MCHC: 32.2 g/dL (ref 30.0–36.0)
MCV: 91.8 fL (ref 80.0–100.0)
Monocytes Absolute: 0.4 10*3/uL (ref 0.1–1.0)
Monocytes Relative: 8 %
Neutro Abs: 2.5 10*3/uL (ref 1.7–7.7)
Neutrophils Relative %: 53 %
Platelets: 147 10*3/uL — ABNORMAL LOW (ref 150–400)
RBC: 3.76 MIL/uL — ABNORMAL LOW (ref 3.87–5.11)
RDW: 13.2 % (ref 11.5–15.5)
WBC: 4.8 10*3/uL (ref 4.0–10.5)
nRBC: 0 % (ref 0.0–0.2)

## 2021-05-07 MED ORDER — FENTANYL CITRATE (PF) 100 MCG/2ML IJ SOLN
50.0000 ug | Freq: Once | INTRAMUSCULAR | Status: AC
Start: 2021-05-07 — End: 2021-05-07
  Administered 2021-05-07: 50 ug via INTRAVENOUS
  Filled 2021-05-07: qty 2

## 2021-05-07 MED ORDER — SODIUM CHLORIDE 0.9 % IV BOLUS
1000.0000 mL | Freq: Once | INTRAVENOUS | Status: AC
Start: 2021-05-07 — End: 2021-05-07
  Administered 2021-05-07: 1000 mL via INTRAVENOUS

## 2021-05-07 MED ORDER — KETOROLAC TROMETHAMINE 10 MG PO TABS
10.0000 mg | ORAL_TABLET | Freq: Four times a day (QID) | ORAL | 0 refills | Status: DC | PRN
Start: 2021-05-07 — End: 2022-01-21

## 2021-05-07 MED ORDER — IOHEXOL 300 MG/ML  SOLN
100.0000 mL | Freq: Once | INTRAMUSCULAR | Status: AC | PRN
  Administered 2021-05-07: 100 mL via INTRAVENOUS

## 2021-05-07 NOTE — ED Notes (Signed)
Patient transported to X-ray 

## 2021-05-07 NOTE — ED Notes (Signed)
Pt ambulatory to bedside commode with cane at this time. Gait steady. No acute distress noted at this time.

## 2021-05-07 NOTE — ED Notes (Signed)
ED Provider at bedside. 

## 2021-05-07 NOTE — ED Triage Notes (Signed)
Pt tripped and fell in her bathroom and is reporting pain from fall in back, hip and abdomen.  No LOC, did not strike head.

## 2021-05-07 NOTE — Discharge Instructions (Addendum)
You may take Toradol as needed for pain.  Apply ice to affected area several times daily.  Return to the ER for worsening symptoms, persistent vomiting, difficulty breathing or other concerns.

## 2021-05-07 NOTE — ED Provider Notes (Signed)
[facility] Emergency Department Provider Note   ____________________________________________   Event Date/Time   First MD Initiated Contact with Patient 05/07/21 0107     (approximate)  I have reviewed the triage vital signs and the nursing notes.   HISTORY  Chief Complaint Fall    HPI [name] [name] is a 55 y.o. female who presents to the ED from home status post mechanical fall.  Patient tripped over her disability chair in the bathroom and fell, striking her right lower abdomen and hip against the bathtub.  Did not strike her head or suffer LOC.  Complains of right lower quadrant abdominal pain as well as right hip pain.  She was able to get up with the help of her family member and ambulate with her cane.  Denies vision changes, neck pain, chest pain, shortness of breath, nausea, vomiting or dizziness.  Takes a full-strength aspirin daily.     Past Medical History:  Diagnosis Date  . ADHD   . Anemia   . ANXIETY 06/06/2007  . Arthritis    related to lupus  . Asthma   . BACK PAIN 02/22/2009  . Cervical cancer ([place])    LEEP 2005   . Chicken pox   . Chronic pain    goes to pain management provider  . Collagen vascular disease ([place])   . Connective tissue disease ([place])   . DEPRESSION 06/06/2007  . Essential tremor   . Fibromyalgia   . Fibromyalgia   . FREQUENCY, URINARY 07/07/2007  . Gastritis   . GERD 06/06/2007  . Glaucoma   . Headache    h/o migraines   . History of hiatal hernia   . History of kidney stones   . HYPOTHYROIDISM 06/06/2007  . INTERSTITIAL CYSTITIS 09/11/2010  . Lupus ([place]) 2006  . Lupus ([place])   . Menopause   . Migraine   . Mitral valve regurgitation   . NEPHROLITHIASIS, HX OF 11/05/2010  . Neuropathy   . Neuropathy   . OCD (obsessive compulsive disorder)   . Optic neuritis    2005  . OSTEOPENIA 10/14/2009  . Osteoporosis   . OVERACTIVE BLADDER 10/14/2009  . PAIN, CHRONIC NEC 06/06/2007  . Panniculitis   .  Parkinson disease ([place]) 04/03/2019  . POLYARTHRITIS 08/19/2007  . Raynaud's disease   . Sicca syndrome ([place])   . 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 Vaginal prolapse    Dr. Marcelline Mates [place]   . Vasculitis ([place])   . WEIGHT GAIN 02/22/2009    Patient Active Problem List   Diagnosis Date Noted  . Tear of right gluteus medius tendon 09/09/2020  . Deformity of both feet 06/05/2020  . Bilateral foot pain 06/05/2020  . Annual physical exam 11/17/2019  . CTS (carpal tunnel syndrome) 11/17/2019  . Vitamin D deficiency 07/04/2019  . Anemia 07/04/2019  . Chronic venous insufficiency 04/18/2019  . Lymphedema 04/18/2019  . Parkinson disease ([place]) 04/03/2019  . Connective tissue disorder ([place]) 02/14/2019  . RUQ abdominal pain 02/14/2019  . Hiatal hernia 10/06/2018  . ADHD 08/04/2018  . Lupus arthritis ([place]) 08/04/2018  . Asthma 08/04/2018  . Systemic lupus erythematosus ([place]) 08/04/2018  . Urinary incontinence 08/04/2018  . Glaucoma 08/04/2018  . Migraines 08/04/2018  . Mitral valve regurgitation 08/04/2018  . Neuropathy 08/04/2018  . OCD (obsessive compulsive disorder) 08/04/2018  . Osteopenia 08/04/2018  . Thrombocytopenia ([place]) 07/06/2018  . Chest pain 07/04/2018  . Allergic rhinitis 12/16/2017  . Dizziness 12/16/2017  . Orthostatic hypotension  12/16/2017  . Generalized abdominal pain 12/16/2017  . Chronic constipation 12/16/2017  . Acute nonintractable headache 12/13/2017  . Fibromyalgia 11/04/2017  . Status post hardware removal 02/16/2017  . 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 Abdominal pain, epigastric 09/08/2013  . Chronic pain 03/31/2011  . NEPHROLITHIASIS, HX OF 11/05/2010  . INTERSTITIAL CYSTITIS 09/11/2010  . Overactive bladder 10/14/2009  . Disorder of bone and cartilage 10/14/2009  . Backache 02/22/2009  . WEIGHT GAIN 02/22/2009  . 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 Polyarthropathy or polyarthritis of multiple sites 08/19/2007  . FREQUENCY, URINARY  07/07/2007  . Hypothyroidism 06/06/2007  . Anxiety state 06/06/2007  . Depression, recurrent ([place]) 06/06/2007  . PAIN, CHRONIC NEC 06/06/2007  . GERD 06/06/2007    Past Surgical History:  Procedure Laterality Date  . APPENDECTOMY    . CERVICAL BIOPSY  W/ LOOP ELECTRODE EXCISION  2005  . CHOLECYSTECTOMY    . ESOPHAGOGASTRODUODENOSCOPY (EGD) WITH PROPOFOL N/A 08/27/2017   Procedure: ESOPHAGOGASTRODUODENOSCOPY (EGD) WITH PROPOFOL;  Surgeon: Manya Silvas, MD;  Location: [facility] ENDOSCOPY;  Service: Endoscopy;  Laterality: N/A;  . HAND SURGERY     repair of left metatarsal   . HARDWARE REMOVAL Right 02/16/2017   Procedure: HARDWARE REMOVAL FROM HIP;  Surgeon: Hessie Knows, MD;  Location: [facility] ORS;  Service: Orthopedics;  Laterality: Right;  . JOINT REPLACEMENT    . OTHER SURGICAL HISTORY     left 3rd metatarsal fracture repair 2011   . REVISION TOTAL HIP ARTHROPLASTY  06/2009   replacement 2010 then screw and plate removal 3662; right hip  . TONSILLECTOMY    . TONSILLECTOMY    . wrist  ligament repair bilateral    . WRIST RECONSTRUCTION    . WRIST SURGERY     laceration of right wrist ligaments  . wrist surgery     laceration of left wrist surgery     Prior to Admission medications   Medication Sig Start Date End Date Taking? Authorizing Provider  ketorolac (TORADOL) 10 MG tablet Take 1 tablet (10 mg total) by mouth every 6 (six) hours as needed. 05/07/21  Yes [name], MD  acetaminophen (TYLENOL) 650 MG CR tablet Take 650 mg by mouth every 8 (eight) hours as needed for pain.    [provider]  albuterol (VENTOLIN HFA) 108 (90 Base) MCG/ACT inhaler Inhale 1-2 puffs into the lungs every 4 (four) hours as needed for wheezing or shortness of breath. 03/20/21   McLean-Scocuzza, Nino Glow, MD  ARIPiprazole (ABILIFY) 20 MG tablet Take 20 mg by mouth daily.    [provider]  Ascorbic Acid (VITAMIN C) 500 MG CHEW Chew 2,500 mg by mouth daily.    [provider]   aspirin 325 MG tablet Take 1 tablet (325 mg total) by mouth daily. 02/18/17   Duanne Guess, PA-C  Biotin 10 MG CAPS Take by mouth.    [provider]  Biotin 5000 MCG SUBL Place 10,000 mcg under the tongue daily.    [provider]  carbidopa-levodopa (SINEMET IR) 10-100 MG tablet Take 0.5 tablets by mouth 3 (three) times daily.    [provider]  Cholecalciferol (VITAMIN D3) 2000 units capsule Vitamin D3 2,000 unit capsule    [provider]  dapsone 25 MG tablet Take by mouth daily.     [provider]  dexmethylphenidate (FOCALIN XR) 20 MG 24 hr capsule dexmethylphenidate ER 20 mg capsule,extended release biphasic50-50    [provider]  diclofenac sodium (  VOLTAREN) 1 % GEL Apply 2 g topically 4 (four) times daily as needed (for knee & finger pain.).     [provider]  dicyclomine (BENTYL) 10 MG capsule dicyclomine 10 mg capsule  every 8hours as needed    [provider]  DULoxetine (CYMBALTA) 60 MG capsule Take 60 mg by mouth 2 (two) times daily.    [provider]  Elastic Bandages & Supports (MEDICAL COMPRESSION STOCKINGS) MISC Please provide 14mmHg compression stockings 02/09/19   Harvest Dark, MD  fentaNYL (DURAGESIC - DOSED MCG/HR) 75 MCG/HR Place 75 mcg onto the skin every 3 (three) days.    [provider]  Ferrous Sulfate (IRON PO) Take by mouth.    [provider]  fluocinonide ointment (LIDEX) 0.05 % fluocinonide 0.05 % topical ointment  APPLY TO THE AFFECTED AREA(S) BY TOPICAL ROUTE 2 TIMES PER DAY    [provider]  Fluticasone-Salmeterol (ADVAIR DISKUS) 250-50 MCG/DOSE AEPB Inhale 1 puff into the lungs 2 (two) times daily. Rinse mouth 08/17/19   McLean-Scocuzza, Nino Glow, MD  folic acid (FOLVITE) 1 MG tablet Take 1 tablet (1 mg total) by mouth daily. 09/09/20   McLean-Scocuzza, Nino Glow, MD  furosemide (LASIX) 20 MG tablet Take 20 mg by mouth daily as needed.   02/08/19 12/31/20  [provider]  hydrocortisone (ANUSOL-HC) 2.5 % rectal cream Proctozone-HC 2.5 % topical cream perineal applicator    [provider]  hydroxychloroquine (PLAQUENIL) 200 MG tablet Plaquenil 200 mg tablet  Take 1 tablet twice a day by oral route after meals for 90 days.    [provider]  ibuprofen (ADVIL,MOTRIN) 800 MG tablet Take 1 tablet (800 mg total) by mouth every 8 (eight) hours as needed. 08/04/17   Laban Emperor, PA-C  imiquimod (ALDARA) 5 % cream imiquimod 5 % topical cream packet  APPLY TO THE AFFECTED AREA(S) BY TOPICAL ROUTE 3 TIMES PER WEEK at bedtime    [provider]  isosorbide mononitrate (IMDUR) 60 MG 24 hr tablet Take 1 tablet (60 mg total) by mouth daily. 05/26/18   Carrie Mew, MD  levothyroxine (SYNTHROID) 75 MCG tablet TAKE 1 TABLET BY MOUTH DAILY 30 MINUTES BEFORE BREAKFAST 03/21/21   McLean-Scocuzza, Nino Glow, MD  LORazepam (ATIVAN) 2 MG tablet Take 1-2 mg by mouth See admin instructions. 1 MG DAILY AS NEEDED FOR ANXIETY & SCHEDULED AT BEDTIME [facility] NIGHT    [provider]  methotrexate 250 MG/10ML injection Inject into the skin once a week.  02/20/20   [provider]  methylphenidate 18 MG PO CR tablet Take by mouth.     [provider]  montelukast (SINGULAIR) 10 MG tablet montelukast 10 mg tablet 1 pill qhs 03/20/21   McLean-Scocuzza, Nino Glow, MD  Multiple Vitamins-Minerals (MULTI-VITAMIN GUMMIES PO) Multi Vitamin  GUMMIES    [provider]  omeprazole (PRILOSEC) 40 MG capsule Take 40 mg by mouth 2 (two) times daily.    [provider]  ondansetron (ZOFRAN) 4 MG tablet Take 1 tablet (4 mg total) by mouth every 8 (eight) hours as needed for nausea or vomiting. 09/06/20   McLean-Scocuzza, Nino Glow, MD  PARoxetine (PAXIL) 10 MG tablet TAKE 1 TABLET (10 MG TOTAL) BY MOUTH AT BEDTIME. 03/13/21   Rubie Maid, MD  polyethylene glycol powder (GLYCOLAX/MIRALAX) powder Take 17 g  by mouth daily. 03/29/18   Harvest Dark, MD  Polyvinyl Alcohol-Povidone (REFRESH OP) Place 1 drop into both eyes 2 (two) times daily.  [provider]  predniSONE (DELTASONE) 10 MG tablet prednisone 10 mg tablet  1 daily    [provider]  Probiotic Product (PROBIOTIC PO) Probiotic 10 billion cell capsule  daily    [provider]  Sodium Chloride 3 % AERS Saline Nasal Mist 3 %  spray nose deeply every hour or two and blow nose    [provider]  sucralfate (CARAFATE) 1 g tablet Take 1 tablet (1 g total) by mouth 4 (four) times daily -  with meals and at bedtime. Dissolve in 8 ounces of liquid 09/06/20   McLean-Scocuzza, Nino Glow, MD  terbinafine (LAMISIL AT) 1 % cream Apply 1 application topically 2 (two) times daily. 04/26/19   McLean-Scocuzza, Nino Glow, MD  tiZANidine (ZANAFLEX) 4 MG capsule Take by mouth.    [provider]  topiramate (TOPAMAX) 100 MG tablet 100-150 mg. 100 in am and 150 qhs    [provider]  triamcinolone ointment (KENALOG) 0.5 % Apply 1 application topically daily. 12/28/19   Kris Hartmann, NP  valACYclovir (VALTREX) 500 MG tablet valacyclovir 500 mg tablet  1 daily    [provider]    Allergies Bee venom, Fish allergy, Latex, Prasterone, Shellfish allergy, Shellfish-derived products, Compazine [prochlorperazine edisylate], Dhea [nutritional supplements], Dilaudid [hydromorphone hcl], Famotidine, Lactose, Lactose intolerance (gi), Other, Prochlorperazine, Promethazine, Promethazine hcl, Reclast [zoledronic acid], Clarithromycin, Clotrimazole, Hydromorphone hcl, and Penicillins  Family History  Problem Relation Age of Onset  . Hypertension Mother   . Arthritis Mother   . Heart disease Mother        afib  . Asthma Sister   . Asthma Daughter   . Arthritis Daughter   . Heart disease Daughter        ?heart condition on BB  . ADD / ADHD Daughter   . Pancreatic cancer Father   . Cancer Father         pancreatitic   . Diabetes Maternal Grandmother   . Heart disease Maternal Grandmother   . Arthritis Maternal Grandmother   . Hypertension Maternal Grandmother   . Dementia Maternal Grandmother   . Colon cancer Maternal Uncle 39  . Crohn's disease Maternal Aunt   . Breast cancer Maternal Aunt 65  . Diabetes Maternal Aunt   . Breast cancer Cousin 83       maternal  . Cancer Cousin        m cousin breat cancer s/p removal both breasts     Social History Social History   Tobacco Use  . Smoking status: Never Smoker  . Smokeless tobacco: Never Used  Vaping Use  . Vaping Use: Never used  Substance Use Topics  . Alcohol use: No  . Drug use: No    Review of Systems  Constitutional: No fever/chills Eyes: No visual changes. ENT: No sore throat. Cardiovascular: Denies chest pain. Respiratory: Denies shortness of breath. Gastrointestinal: Positive for abdominal pain.  No nausea, no vomiting.  No diarrhea.  No constipation. Genitourinary: Negative for dysuria. Musculoskeletal: Positive for right hip pain.  Negative for back pain. Skin: Negative for rash. Neurological: Negative for headaches, focal weakness or numbness.   ____________________________________________   PHYSICAL EXAM:  VITAL SIGNS: ED Triage Vitals  Enc Vitals Group     BP 05/07/21 0001 (!) 132/54     Pulse Rate 05/07/21 0001 86     Resp 05/07/21 0001 14     Temp 05/07/21 0001 98.2 F (36.8 C)     Temp Source 05/07/21  0001 Oral     SpO2 05/07/21 0001 98 %     Weight 05/07/21 0101 207 lb (93.9 kg)     Height 05/07/21 0101 5\' 10"  (1.778 m)     Head Circumference --      Peak Flow --      Pain Score 05/07/21 0003 9     Pain Loc --      Pain Edu? --      Excl. in [place]? --     Constitutional: Alert and oriented.  Chronically ill appearing and in mild acute distress. Eyes: Conjunctivae are normal. PERRL. EOMI. Head: Atraumatic. Nose: Atraumatic. Mouth/Throat: Mucous membranes are moist.  No  dental malocclusion. Neck: No stridor.  No cervical spine tenderness to palpation. Cardiovascular: Normal rate, regular rhythm. Grossly normal heart sounds.  Good peripheral circulation. Respiratory: Normal respiratory effort.  No retractions. Lungs CTAB. Gastrointestinal: Soft and mildly tender to palpation right lower quadrant without rebound or guarding.  No external evidence of bruising.  No distention. No abdominal bruits. No CVA tenderness. Musculoskeletal: No spinal tenderness to palpation.  Right hip mildly tender to palpation.  Full range of motion with pain.  2+ distal pulses.  Brisk, less than 5-second capillary refill..  No joint effusions. Neurologic:  Normal speech and language. No gross focal neurologic deficits are appreciated.  Skin:  Skin is warm, dry and intact. No rash noted. Psychiatric: Mood and affect are normal. Speech and behavior are normal.  ____________________________________________   LABS (all labs ordered are listed, but only abnormal results are displayed)  Labs Reviewed  CBC WITH DIFFERENTIAL/PLATELET - Abnormal; Notable for the following components:      Result Value   RBC 3.76 (*)    Hemoglobin 11.1 (*)    HCT 34.5 (*)    Platelets 147 (*)    All other components within normal limits  BASIC METABOLIC PANEL - Abnormal; Notable for the following components:   Glucose, Bld 120 (*)    Calcium 8.7 (*)    All other components within normal limits   ____________________________________________  EKG  None ____________________________________________  RADIOLOGY I, Beckem Tomberlin J, personally viewed and evaluated these images (plain radiographs) as part of my medical decision making, as well as reviewing the written report by the radiologist.  ED MD interpretation: No traumatic injuries to abdomen or right hip  Official radiology report(s): CT Abdomen Pelvis W Contrast  Result Date: 05/07/2021 CLINICAL DATA:  Abdominal trauma.  Trip and fall EXAM: CT  ABDOMEN AND PELVIS WITH CONTRAST TECHNIQUE: Multidetector CT imaging of the abdomen and pelvis was performed using the standard protocol following bolus administration of intravenous contrast. CONTRAST:  147mL OMNIPAQUE IOHEXOL 300 MG/ML  SOLN COMPARISON:  Ultrasound abdomen 10/06/2019, CT abdomen pelvis 03/29/18 FINDINGS: Lower chest: No acute abnormality. Hepatobiliary: No focal liver abnormality is seen. Status post cholecystectomy. No biliary dilatation. No subcapsular hematoma or laceration. Pancreas: Proximal atrophy. No focal lesion. Otherwise normal pancreatic contour. No surrounding inflammatory changes. No main pancreatic ductal dilatation. Spleen: CT Normal in size without focal abnormality. No subcapsular hematoma or laceration. Adrenals/Urinary Tract: No adrenal nodule bilaterally. Bilateral kidneys enhance symmetrically. 1.5 cm fluid density lesion within the inferior pole the right kidney likely representing a simple renal cyst. No hydronephrosis. No hydroureter. No subcapsular hematoma or laceration. The urinary bladder is unremarkable. On delayed imaging, there is no urothelial wall thickening and there are no filling defects in the opacified portions of the bilateral collecting systems or ureters. Stomach/Bowel: Stomach is within normal  limits. No evidence of bowel wall thickening or dilatation. Stool throughout the colon. Status post appendectomy. Vascular/Lymphatic: No abdominal aorta or iliac aneurysm. Mild atherosclerotic plaque of the aorta and its branches. No abdominal, pelvic, or inguinal lymphadenopathy. Reproductive: Uterus and bilateral adnexa are unremarkable. Other: No intraperitoneal free fluid. No intraperitoneal free gas. No organized fluid collection. No mesenteric hematoma. Musculoskeletal: No abdominal wall hernia or abnormality. Right gluteal subcutaneus soft tissue edema. No suspicious lytic or blastic osseous lesions. No acute displaced fracture. Multilevel degenerative changes  of the spine. Redemonstration of removal of proximal right femoral hardware. IMPRESSION: 1.  No acute traumatic injury to the abdomen or pelvis. 2. No acute fracture or traumatic malalignment of the lumbar spine. Electronically Signed   By: Iven Finn M.D.   On: 05/07/2021 03:10   DG Hip Unilat With Pelvis 2-3 Views Right  Result Date: 05/07/2021 CLINICAL DATA:  Fall, right hip pain EXAM: DG HIP (WITH OR WITHOUT PELVIS) 2-3V RIGHT COMPARISON:  08/04/2017 FINDINGS: Single view radiograph the pelvis and two view radiograph of the right hip demonstrates normal alignment. No fracture or dislocation. Right hip femoral neck ORIF has previously been performed with defects related to removal of the surgical hardware. Right hip joint space is preserved. Soft tissues are unremarkable. Limited evaluation of the left hip is unremarkable. IMPRESSION: Negative. Electronically Signed   By: Fidela Salisbury MD   On: 05/07/2021 02:17    ____________________________________________   PROCEDURES  Procedure(s) performed (including Critical Care):  Procedures   ____________________________________________   INITIAL IMPRESSION / ASSESSMENT AND PLAN / ED COURSE  As part of my medical decision making, I reviewed the following data within the [place] notes reviewed and incorporated, Labs reviewed, Old chart reviewed, Radiograph reviewed and Notes from prior ED visits     55 year old female presenting with right lower quadrant abdominal pain and right hip pain status post mechanical fall.  Differential diagnosis includes but is not limited to musculoskeletal contusion, intra-abdominal injury, hip fracture, hip dislocation, etc.  Will obtain basic lab work, image right hip and abdomen.  Patient declines Dilaudid and Morphine for pain.  Has a 189mcg Fentanyl patch on.  Will administer small bolus IV fentanyl for pain.  Clinical Course as of 05/07/21 0450  Wed May 07, 2021  0317  Patient feeling better.  Updated patient and family member of laboratory and imaging results.  Will discharge home with limited quantity oral Toradol to take as needed.  Strict return precautions given.  Both verbalized understanding agree with plan of care. [JS]    Clinical Course User Index [JS] [name], MD     ____________________________________________   FINAL CLINICAL IMPRESSION(S) / ED DIAGNOSES  Final diagnoses:  Fall, initial encounter  Right lower quadrant abdominal pain  Right hip pain     ED Discharge Orders         Ordered    ketorolac (TORADOL) 10 MG tablet  Every 6 hours PRN        05/07/21 0320           Note:  This document was prepared using Dragon voice recognition software and may include unintentional dictation errors.   [name], MD 05/07/21 [phone]

## 2021-05-26 ENCOUNTER — Encounter: Payer: Self-pay | Admitting: Internal Medicine

## 2021-05-29 ENCOUNTER — Telehealth: Payer: Self-pay | Admitting: Internal Medicine

## 2021-05-29 NOTE — Telephone Encounter (Signed)
I spoke with [name] [name] regarding [facility] not accepting [name] [name]. I called [name] [name] and explained that they are not able to take her. [name] [name] stated she has a appt at [facility] rheumatology on 01/05/2022 and that she needs refills. I spoke with Dr Olivia Mackie she states she doesnot refill for lupus medication. I advised to [name] [name] that she will have to call the provider that prescribed the medication and also she will need to sign a release of information to get her records faxed to [facility]. [name] [name] understood

## 2021-05-29 NOTE — Telephone Encounter (Signed)
Noted  

## 2021-05-29 NOTE — Telephone Encounter (Signed)
err

## 2021-08-11 ENCOUNTER — Telehealth: Payer: Self-pay | Admitting: Internal Medicine

## 2021-08-11 NOTE — Telephone Encounter (Signed)
Patient is a Dr Aundra Dubin patient. She dropped off Disability paper work. Since today Vidal Schwalbe is the doc of the day paper work is in [place].

## 2021-08-12 NOTE — Telephone Encounter (Signed)
Do you want patient to have an appointment for this? We do not know patient & this is for [facility].

## 2021-08-13 NOTE — Telephone Encounter (Signed)
LMTCB

## 2021-08-13 NOTE — Telephone Encounter (Signed)
Call patient Schedule appointment.  She has not been seen by primary care this calendar year

## 2021-08-21 ENCOUNTER — Ambulatory Visit (INDEPENDENT_AMBULATORY_CARE_PROVIDER_SITE_OTHER): Payer: Medicare Other

## 2021-08-21 ENCOUNTER — Other Ambulatory Visit: Payer: Self-pay

## 2021-08-21 DIAGNOSIS — Z23 Encounter for immunization: Secondary | ICD-10-CM

## 2021-08-25 NOTE — Telephone Encounter (Signed)
Pt took 8 am tomorrow with Dr. Caryl Bis to have her paperwork filled out. We did not have anything sooner FYI.

## 2021-08-26 ENCOUNTER — Other Ambulatory Visit: Payer: Self-pay

## 2021-08-26 ENCOUNTER — Ambulatory Visit (INDEPENDENT_AMBULATORY_CARE_PROVIDER_SITE_OTHER): Payer: Medicare Other | Admitting: Family Medicine

## 2021-08-26 VITALS — BP 118/78 | HR 90 | Temp 98.6°F | Ht 70.0 in | Wt 212.6 lb

## 2021-08-26 DIAGNOSIS — R7309 Other abnormal glucose: Secondary | ICD-10-CM | POA: Diagnosis not present

## 2021-08-26 DIAGNOSIS — G629 Polyneuropathy, unspecified: Secondary | ICD-10-CM

## 2021-08-26 DIAGNOSIS — M329 Systemic lupus erythematosus, unspecified: Secondary | ICD-10-CM | POA: Diagnosis not present

## 2021-08-26 DIAGNOSIS — Z13220 Encounter for screening for lipoid disorders: Secondary | ICD-10-CM | POA: Diagnosis not present

## 2021-08-26 DIAGNOSIS — G20 Parkinson's disease: Secondary | ICD-10-CM | POA: Diagnosis not present

## 2021-08-26 DIAGNOSIS — M359 Systemic involvement of connective tissue, unspecified: Secondary | ICD-10-CM

## 2021-08-26 DIAGNOSIS — E039 Hypothyroidism, unspecified: Secondary | ICD-10-CM | POA: Diagnosis not present

## 2021-08-26 LAB — COMPREHENSIVE METABOLIC PANEL
ALT: 9 U/L (ref 0–35)
AST: 18 U/L (ref 0–37)
Albumin: 4.2 g/dL (ref 3.5–5.2)
Alkaline Phosphatase: 84 U/L (ref 39–117)
BUN: 16 mg/dL (ref 6–23)
CO2: 25 mEq/L (ref 19–32)
Calcium: 8.5 mg/dL (ref 8.4–10.5)
Chloride: 106 mEq/L (ref 96–112)
Creatinine, Ser: 0.85 mg/dL (ref 0.40–1.20)
GFR: 76.36 mL/min (ref >60.00)
Glucose, Bld: 94 mg/dL (ref 70–99)
Potassium: 3.8 mEq/L (ref 3.5–5.1)
Sodium: 140 mEq/L (ref 135–145)
Total Bilirubin: 0.3 mg/dL (ref 0.2–1.2)
Total Protein: 6.2 g/dL (ref 6.0–8.3)

## 2021-08-26 LAB — HEMOGLOBIN A1C: Hgb A1c MFr Bld: 5.8 % (ref 4.6–6.5)

## 2021-08-26 LAB — LIPID PANEL
Cholesterol: 166 mg/dL (ref 0–200)
HDL: 61.3 mg/dL (ref >39.00)
LDL Cholesterol: 91 mg/dL (ref 0–99)
NonHDL: 104.57
Total CHOL/HDL Ratio: 3
Triglycerides: 66 mg/dL (ref 0.0–149.0)
VLDL: 13.2 mg/dL (ref 0.0–40.0)

## 2021-08-26 LAB — TSH: TSH: 2.8 u[IU]/mL (ref 0.35–5.50)

## 2021-08-26 NOTE — Patient Instructions (Signed)
Nice to see you. We will contact you when your paperwork is ready. We will get lab work today.

## 2021-08-26 NOTE — Telephone Encounter (Signed)
Form has been completed. Please fill in our information on the second page and print off her medication list. Then let her know it is available for pick up. Please make a copy to be scanned in to the chart. Thanks.

## 2021-08-26 NOTE — Assessment & Plan Note (Signed)
Chronic issue.  Some worsening per patient report.  She will continue to see neurology.  Disability paperwork will be completed.

## 2021-08-26 NOTE — Assessment & Plan Note (Signed)
Chronic issue.  She will continue to see rheumatology.

## 2021-08-26 NOTE — Progress Notes (Signed)
Tommi Rumps, MD Phone: [phone]  [name] [name] is a 55 y.o. female who presents today for follow-up and completion of disability paperwork.  Follow-up on conditions including lupus, Parkinson's, neuropathy, cognitive decline  The patient presents for follow-up for multiple issues and for her long-term disability paperwork.  This has been filled out by her PCP previously.  She follows with multiple specialist for a variety of medical issues.  She has Parkinson's disease which she reports has worsened.  She follows with neurology for this.  She has had more tremor in her bilateral hands.  Her energy has been lower.  She notes crows feet from the Parkinson's which orthopedics recommended a device and then possible surgery for her.  She notes she lost her smell over the summer and that has been attributed to her Parkinson's as she had a negative COVID test.  She has been on Sinemet and notes the neurologist is hesitant to increase the dose yet.  She also has lupus.  She has chronic joint pain from this.  She is on a fentanyl patch.  She was on Percocet previously though she gave that up as it was making her loopy.  Her narcotics interfere with her ability to do her job previously.  She does have chronic joint pain which is still there.  She does have right hip pain and saw orthopedics recently for this.  They gave her a shot into her joint and it helped for about 3 weeks.  The pain has started to come back.  She also has neuropathy particularly in her left leg which she notes is related to the lupus.  Notes it is difficult to walk related to this.  She also reports a history of migraine headaches, gastritis, and hypothyroidism.  She has trouble standing for more than 10 to 15 minutes at a time.  She can walk for about 15 minutes at a time.  She can sit for as long as needed.  She is not able to balance well.  She can only stoop over for a few minutes.  She can not kneel.  She is able to use her  fingers to a certain degree.  She can crouch for a few minutes.  No crawling, grasping, or climbing.  She is able to push and pull for few minutes.  She cannot bend over well.  She cannot reach above her head.  She is able to use her arms at desk level and occasionally reach down.  She does note cognitively her memory has been worsening related to her Parkinson's.  That has been going on for 1.5 years.  She follows with her neurologist for that.  She has trouble remembering things that she read.  She also forgets what she did yesterday and oftentimes forgets which medicine she has taken.  She has to use a stool to get in and out of bed.  She previously worked as a [employer] and notes the onset of her disabling condition was first noticed on 02/10/2004 when she was hospitalized and told that she should not go back to work by neurology at that time.  She has been out of work since then.  Social History   Tobacco Use  Smoking Status Never  Smokeless Tobacco Never    Current Outpatient Medications on File Prior to Visit  Medication Sig Dispense Refill   acetaminophen (TYLENOL) 650 MG CR tablet Take 650 mg by mouth every 8 (eight) hours as needed for pain.  albuterol (VENTOLIN HFA) 108 (90 Base) MCG/ACT inhaler Inhale 1-2 puffs into the lungs every 4 (four) hours as needed for wheezing or shortness of breath. 18 g 11   ARIPiprazole (ABILIFY) 20 MG tablet Take 20 mg by mouth daily.     Ascorbic Acid (VITAMIN C) 500 MG CHEW Chew 2,500 mg by mouth daily.     aspirin 325 MG tablet Take 1 tablet (325 mg total) by mouth daily. 30 tablet 0   Biotin 10 MG CAPS Take by mouth.     Biotin 5000 MCG SUBL Place 10,000 mcg under the tongue daily.     carbidopa-levodopa (SINEMET IR) 10-100 MG tablet Take 0.5 tablets by mouth 3 (three) times daily.     Cholecalciferol (VITAMIN D3) 2000 units capsule Vitamin D3 2,000 unit capsule     dapsone 25 MG tablet Take by mouth daily.      dexmethylphenidate (FOCALIN XR)  20 MG 24 hr capsule dexmethylphenidate ER 20 mg capsule,extended release biphasic50-50     diclofenac sodium (VOLTAREN) 1 % GEL Apply 2 g topically 4 (four) times daily as needed (for knee & finger pain.).      dicyclomine (BENTYL) 10 MG capsule dicyclomine 10 mg capsule  every 8hours as needed     DULoxetine (CYMBALTA) 60 MG capsule Take 60 mg by mouth 2 (two) times daily.     Elastic Bandages & Supports (MEDICAL COMPRESSION STOCKINGS) MISC Please provide 21mHg compression stockings 1 each 0   fentaNYL (DURAGESIC - DOSED MCG/HR) 75 MCG/HR Place 75 mcg onto the skin every 3 (three) days.     Ferrous Sulfate (IRON PO) Take by mouth.     fluocinonide ointment (LIDEX) 0.05 % fluocinonide 0.05 % topical ointment  APPLY TO THE AFFECTED AREA(S) BY TOPICAL ROUTE 2 TIMES PER DAY     Fluticasone-Salmeterol (ADVAIR DISKUS) 250-50 MCG/DOSE AEPB Inhale 1 puff into the lungs 2 (two) times daily. Rinse mouth 1 each 12   folic acid (FOLVITE) 1 MG tablet Take 1 tablet (1 mg total) by mouth daily. 90 tablet 3   hydrocortisone (ANUSOL-HC) 2.5 % rectal cream Proctozone-HC 2.5 % topical cream perineal applicator     hydroxychloroquine (PLAQUENIL) 200 MG tablet Plaquenil 200 mg tablet  Take 1 tablet twice a day by oral route after meals for 90 days.     ibuprofen (ADVIL,MOTRIN) 800 MG tablet Take 1 tablet (800 mg total) by mouth every 8 (eight) hours as needed. 30 tablet 0   imiquimod (ALDARA) 5 % cream imiquimod 5 % topical cream packet  APPLY TO THE AFFECTED AREA(S) BY TOPICAL ROUTE 3 TIMES PER WEEK at bedtime     isosorbide mononitrate (IMDUR) 60 MG 24 hr tablet Take 1 tablet (60 mg total) by mouth daily. 20 tablet 0   ketorolac (TORADOL) 10 MG tablet Take 1 tablet (10 mg total) by mouth every 6 (six) hours as needed. 10 tablet 0   levothyroxine (SYNTHROID) 75 MCG tablet TAKE 1 TABLET BY MOUTH DAILY 30 MINUTES BEFORE BREAKFAST 90 tablet 3   LORazepam (ATIVAN) 2 MG tablet Take 1-2 mg by mouth See admin  instructions. 1 MG DAILY AS NEEDED FOR ANXIETY & SCHEDULED AT BEDTIME EACH NIGHT     methotrexate 250 MG/10ML injection Inject into the skin once a week.      methylphenidate 18 MG PO CR tablet Take by mouth.      montelukast (SINGULAIR) 10 MG tablet montelukast 10 mg tablet 1 pill qhs 90 tablet 3   Multiple  Vitamins-Minerals (MULTI-VITAMIN GUMMIES PO) Multi Vitamin  GUMMIES     omeprazole (PRILOSEC) 40 MG capsule Take 40 mg by mouth 2 (two) times daily.     ondansetron (ZOFRAN) 4 MG tablet Take 1 tablet (4 mg total) by mouth every 8 (eight) hours as needed for nausea or vomiting. 40 tablet 1   PARoxetine (PAXIL) 10 MG tablet TAKE 1 TABLET (10 MG TOTAL) BY MOUTH AT BEDTIME. 90 tablet 3   polyethylene glycol powder (GLYCOLAX/MIRALAX) powder Take 17 g by mouth daily. 255 g 0   Polyvinyl Alcohol-Povidone (REFRESH OP) Place 1 drop into both eyes 2 (two) times daily.     predniSONE (DELTASONE) 10 MG tablet prednisone 10 mg tablet  1 daily     Probiotic Product (PROBIOTIC PO) Probiotic 10 billion cell capsule  daily     Sodium Chloride 3 % AERS Saline Nasal Mist 3 %  spray nose deeply every hour or two and blow nose     sucralfate (CARAFATE) 1 g tablet Take 1 tablet (1 g total) by mouth 4 (four) times daily -  with meals and at bedtime. Dissolve in 8 ounces of liquid 360 tablet 3   terbinafine (LAMISIL AT) 1 % cream Apply 1 application topically 2 (two) times daily. 42 g 2   tiZANidine (ZANAFLEX) 4 MG capsule Take by mouth.     topiramate (TOPAMAX) 100 MG tablet 100-150 mg. 100 in am and 150 qhs     triamcinolone ointment (KENALOG) 0.5 % Apply 1 application topically daily. 30 g 0   valACYclovir (VALTREX) 500 MG tablet valacyclovir 500 mg tablet  1 daily     furosemide (LASIX) 20 MG tablet Take 20 mg by mouth daily as needed.      No current facility-administered medications on file prior to visit.     ROS see history of present illness  Objective  Physical Exam Vitals:   08/26/21 0818   BP: 118/78  Pulse: 90  Temp: 98.6 F (37 C)  SpO2: 99%    BP Readings from Last 3 Encounters:  08/26/21 118/78  05/07/21 (!) 106/58  12/31/20 138/83   Wt Readings from Last 3 Encounters:  08/26/21 212 lb 9.6 oz (96.4 kg)  05/07/21 207 lb (93.9 kg)  12/31/20 211 lb 3.2 oz (95.8 kg)    Physical Exam Constitutional:      General: She is not in acute distress.    Appearance: She is not diaphoretic.  HENT:     Head: Normocephalic and atraumatic.  Cardiovascular:     Rate and Rhythm: Normal rate and regular rhythm.     Heart sounds: Normal heart sounds.  Pulmonary:     Effort: Pulmonary effort is normal.     Breath sounds: Normal breath sounds.  Musculoskeletal:     Comments: Slight tenderness over her lateral right hip, decreased internal and external range of motion right hip  Skin:    General: Skin is warm and dry.  Neurological:     Mental Status: She is alert.     Comments: 5/5 strength in bilateral biceps, triceps, grip, quads, hamstrings, plantar and dorsiflexion, sensation to light touch slightly decreased in her left lower extremity, otherwise intact in bilateral UE and right LE, no cogwheel rigidity in either arm, resting tremor noted     Assessment/Plan: Please see individual problem list.  Problem List Items Addressed This Visit     Connective tissue disorder ([place])   Hypothyroidism - Primary    Check TSH.  Patient  will continue on levothyroxine 75 mcg once daily.      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  She will continue to see rheumatology.      Neuropathy    Stable.  She will continue to see her neurologist.      Parkinson disease [facility])    Chronic issue.  Some worsening per patient report.  She will continue to see neurology.  Disability paperwork will be completed.      Systemic lupus erythematosus ([place])    Seems to be generally stable per her report.  She will continue to see her specialist.      Other Visit  Diagnoses     Elevated glucose       Relevant Orders   Comp Met (CMET)   HgB A1c   Lipid screening       Relevant Orders   Comp Met (CMET)   Lipid panel       Return in about 6 months (around 02/23/2022) for with PCP for chronic medical issues.  This visit occurred during the SARS-CoV-2 public health emergency.  Safety protocols were in place, including screening questions prior to the visit, additional usage of staff PPE, and extensive cleaning of exam room while observing appropriate contact time as indicated for disinfecting solutions.    I have spent 48 minutes in the care of this patient regarding history taking, documentation, completion of exam, review of chart, completion of disability paperwork.   Tommi Rumps, MD [place]

## 2021-08-26 NOTE — Assessment & Plan Note (Signed)
Seems to be generally stable per her report.  She will continue to see her specialist.

## 2021-08-26 NOTE — Assessment & Plan Note (Signed)
Check TSH.  Patient will continue on levothyroxine 75 mcg once daily.

## 2021-08-26 NOTE — Telephone Encounter (Signed)
I called and LVM informing the patient that her form is at the front ready for pickup.  Raiquan Chandler,cma

## 2021-08-26 NOTE — Assessment & Plan Note (Signed)
Stable.  She will continue to see her neurologist.

## 2021-08-27 NOTE — Telephone Encounter (Signed)
I called and LVM for patient to pick up the form.  Charlee Squibb,cma

## 2021-08-29 NOTE — Telephone Encounter (Signed)
LVM informing the patient that we will close today at 12 pm if she has not picked up her form.  Ailsa Mireles,cma

## 2021-09-01 NOTE — Telephone Encounter (Signed)
Patient has picked up the form.  [name] [name],cma

## 2021-09-23 ENCOUNTER — Other Ambulatory Visit (HOSPITAL_BASED_OUTPATIENT_CLINIC_OR_DEPARTMENT_OTHER): Payer: Self-pay | Admitting: Gastroenterology

## 2021-09-23 ENCOUNTER — Other Ambulatory Visit: Payer: Self-pay | Admitting: Gastroenterology

## 2021-09-23 DIAGNOSIS — R110 Nausea: Secondary | ICD-10-CM

## 2021-09-23 DIAGNOSIS — R1310 Dysphagia, unspecified: Secondary | ICD-10-CM

## 2021-09-23 DIAGNOSIS — K219 Gastro-esophageal reflux disease without esophagitis: Secondary | ICD-10-CM

## 2021-09-29 ENCOUNTER — Other Ambulatory Visit: Payer: Self-pay | Admitting: Internal Medicine

## 2021-09-29 DIAGNOSIS — E039 Hypothyroidism, unspecified: Secondary | ICD-10-CM

## 2021-09-29 DIAGNOSIS — J309 Allergic rhinitis, unspecified: Secondary | ICD-10-CM

## 2021-09-29 DIAGNOSIS — M329 Systemic lupus erythematosus, unspecified: Secondary | ICD-10-CM

## 2021-09-29 DIAGNOSIS — J4520 Mild intermittent asthma, uncomplicated: Secondary | ICD-10-CM

## 2021-09-29 DIAGNOSIS — B353 Tinea pedis: Secondary | ICD-10-CM

## 2021-09-29 DIAGNOSIS — K219 Gastro-esophageal reflux disease without esophagitis: Secondary | ICD-10-CM

## 2021-09-29 DIAGNOSIS — R112 Nausea with vomiting, unspecified: Secondary | ICD-10-CM

## 2021-09-29 MED ORDER — SUCRALFATE 1 G PO TABS: 1.0000 g | ORAL_TABLET | Freq: Three times a day (TID) | ORAL | 3 refills | Status: DC

## 2021-09-29 MED ORDER — MONTELUKAST SODIUM 10 MG PO TABS: ORAL_TABLET | ORAL | 3 refills | Status: DC

## 2021-09-29 MED ORDER — FUROSEMIDE 20 MG PO TABS: 20.0000 mg | ORAL_TABLET | Freq: Every day | ORAL | 2 refills | Status: DC | PRN

## 2021-09-29 MED ORDER — LEVOTHYROXINE SODIUM 75 MCG PO TABS: ORAL_TABLET | ORAL | 2 refills | Status: DC

## 2021-09-29 MED ORDER — TERBINAFINE HCL 1 % EX CREA
1.0000 | TOPICAL_CREAM | Freq: Two times a day (BID) | CUTANEOUS | 2 refills | Status: DC
Start: 2021-09-29 — End: 2022-01-21

## 2021-09-29 MED ORDER — ONDANSETRON HCL 4 MG PO TABS
4.0000 mg | ORAL_TABLET | Freq: Three times a day (TID) | ORAL | 2 refills | Status: DC | PRN
Start: 2021-09-29 — End: 2023-05-17

## 2021-09-29 MED ORDER — ALBUTEROL SULFATE HFA 108 (90 BASE) MCG/ACT IN AERS: 1.0000 | INHALATION_SPRAY | RESPIRATORY_TRACT | 11 refills | Status: DC | PRN

## 2021-09-29 MED ORDER — FOLIC ACID 1 MG PO TABS: 1.0000 mg | ORAL_TABLET | Freq: Every day | ORAL | 3 refills | Status: DC

## 2021-09-29 MED ORDER — FLUTICASONE-SALMETEROL 250-50 MCG/ACT IN AEPB: 1.0000 | INHALATION_SPRAY | Freq: Two times a day (BID) | RESPIRATORY_TRACT | 12 refills | Status: DC

## 2021-11-06 ENCOUNTER — Other Ambulatory Visit: Payer: Self-pay

## 2021-11-06 ENCOUNTER — Ambulatory Visit (INDEPENDENT_AMBULATORY_CARE_PROVIDER_SITE_OTHER): Payer: Medicare Other | Admitting: Internal Medicine

## 2021-11-06 ENCOUNTER — Encounter: Payer: Self-pay | Admitting: Internal Medicine

## 2021-11-06 VITALS — BP 124/84 | HR 90 | Temp 97.2°F | Ht 70.0 in | Wt 211.0 lb

## 2021-11-06 DIAGNOSIS — H0289 Other specified disorders of eyelid: Secondary | ICD-10-CM

## 2021-11-06 DIAGNOSIS — M3214 Glomerular disease in systemic lupus erythematosus: Secondary | ICD-10-CM

## 2021-11-06 DIAGNOSIS — R269 Unspecified abnormalities of gait and mobility: Secondary | ICD-10-CM

## 2021-11-06 DIAGNOSIS — E039 Hypothyroidism, unspecified: Secondary | ICD-10-CM

## 2021-11-06 DIAGNOSIS — R82998 Other abnormal findings in urine: Secondary | ICD-10-CM | POA: Diagnosis not present

## 2021-11-06 DIAGNOSIS — H02841 Edema of right upper eyelid: Secondary | ICD-10-CM

## 2021-11-06 DIAGNOSIS — H02844 Edema of left upper eyelid: Secondary | ICD-10-CM

## 2021-11-06 DIAGNOSIS — Z1231 Encounter for screening mammogram for malignant neoplasm of breast: Secondary | ICD-10-CM

## 2021-11-06 DIAGNOSIS — Z659 Problem related to unspecified psychosocial circumstances: Secondary | ICD-10-CM

## 2021-11-06 DIAGNOSIS — E559 Vitamin D deficiency, unspecified: Secondary | ICD-10-CM

## 2021-11-06 DIAGNOSIS — L309 Dermatitis, unspecified: Secondary | ICD-10-CM | POA: Diagnosis not present

## 2021-11-06 DIAGNOSIS — M810 Age-related osteoporosis without current pathological fracture: Secondary | ICD-10-CM

## 2021-11-06 DIAGNOSIS — R7303 Prediabetes: Secondary | ICD-10-CM | POA: Insufficient documentation

## 2021-11-06 DIAGNOSIS — G894 Chronic pain syndrome: Secondary | ICD-10-CM

## 2021-11-06 MED ORDER — HYDROCORTISONE 2.5 % EX CREA: TOPICAL_CREAM | Freq: Two times a day (BID) | CUTANEOUS | 2 refills | Status: DC

## 2021-11-06 NOTE — Patient Instructions (Signed)
Hydrocoritsone b/l arms

## 2021-11-06 NOTE — Progress Notes (Signed)
Chief Complaint  Patient presents with   Referral    Endocrinology    Facial Swelling    Bilateral eye swelling    Arm Problem    Underarm burning    F/u  1. Hypothyroidism, vit d def, steroid induced osteoporosis needs referral to [facility] her endocrine md Dr. Prudencio Burly retired and has appt 02/16/22 with Dr. Jamelle Haring  2. Rash under arms which is raised and darker in color not new products but areas burn thought was due to lace from bra irritating but not going away for months nothing tried  3. C/o b/l eye swelling  4. C/o dark urine at times and c/w with this being side effect from lupus dx will work up  5. Mobility issues b/l walks with cane and states she needs a new toilet seat elevator  Requesting for her daughter [name] Pesa RN for meds, for her h/h Therapist, sports for meds, for mom SLP, OT and RN for meds mom has dementia and wants help with her swallowing and memory and she has to manage everything for everyone and this is getting a lot    Review of Systems  Constitutional:  Negative for weight loss.  HENT:  Negative for hearing loss.   Eyes:  Negative for blurred vision.  Respiratory:  Negative for shortness of breath.   Cardiovascular:  Negative for chest pain.  Gastrointestinal:  Negative for abdominal pain and blood in stool.  Genitourinary:  Negative for dysuria.       Dark urine  Musculoskeletal:  Positive for joint pain. Negative for falls.       8/10 left hip pain fu ortho Dr. Rudene Christians  Skin:  Positive for rash.  Neurological:  Negative for headaches.  Psychiatric/Behavioral:  Negative for depression.   Past Medical History:  Diagnosis Date   ADHD    Anemia    ANXIETY 06/06/2007   Arthritis    related to lupus   Asthma    BACK PAIN 02/22/2009   Cervical cancer ([place])    LEEP 2005    Chicken pox    Chronic pain    goes to pain management provider   Collagen vascular disease ([place])    Connective tissue disease ([place])    DEPRESSION 06/06/2007   Essential tremor     Fibromyalgia    Fibromyalgia    FREQUENCY, URINARY 07/07/2007   Gastritis    GERD 06/06/2007   Glaucoma    Headache    h/o migraines    History of hiatal hernia    History of kidney stones    HYPOTHYROIDISM 06/06/2007   INTERSTITIAL CYSTITIS 09/11/2010   Lupus ([place]) 2006   Lupus ([place])    Menopause    Migraine    Mitral valve regurgitation    NEPHROLITHIASIS, HX OF 11/05/2010   Neuropathy    Neuropathy    OCD (obsessive compulsive disorder)    Optic neuritis    2005   OSTEOPENIA 10/14/2009   Osteoporosis    OVERACTIVE BLADDER 10/14/2009   PAIN, CHRONIC NEC 06/06/2007   Panniculitis    Parkinson disease ([place]) 04/03/2019   POLYARTHRITIS 08/19/2007   Raynaud's disease    Sicca syndrome ([place])    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 Marcelline Mates [facility]    Vasculitis [facility])    WEIGHT GAIN 02/22/2009   Past Surgical History:  Procedure Laterality Date   APPENDECTOMY     CERVICAL BIOPSY  W/ LOOP ELECTRODE EXCISION  2005  CHOLECYSTECTOMY     ESOPHAGOGASTRODUODENOSCOPY (EGD) WITH PROPOFOL N/A 08/27/2017   Procedure: ESOPHAGOGASTRODUODENOSCOPY (EGD) WITH PROPOFOL;  Surgeon: Manya Silvas, MD;  Location: [facility] ENDOSCOPY;  Service: Endoscopy;  Laterality: N/A;   HAND SURGERY     repair of left metatarsal    HARDWARE REMOVAL Right 02/16/2017   Procedure: HARDWARE REMOVAL FROM HIP;  Surgeon: Hessie Knows, MD;  Location: [facility] ORS;  Service: Orthopedics;  Laterality: Right;   JOINT REPLACEMENT     OTHER SURGICAL HISTORY     left 3rd metatarsal fracture repair 2011    REVISION TOTAL HIP ARTHROPLASTY  06/2009   replacement 2010 then screw and plate removal 6629; right hip   TONSILLECTOMY     TONSILLECTOMY     wrist  ligament repair bilateral     WRIST RECONSTRUCTION     WRIST SURGERY     laceration of right wrist ligaments   wrist surgery     laceration of left wrist surgery    Family History  Problem Relation Age of Onset   Hypertension Mother    Arthritis  Mother    Heart disease Mother        afib   Asthma Sister    Asthma Daughter    Arthritis Daughter    Heart disease Daughter        ?heart condition on BB   ADD / ADHD Daughter    Pancreatic cancer Father    Cancer Father        pancreatitic    Diabetes Maternal Grandmother    Heart disease Maternal Grandmother    Arthritis Maternal Grandmother    Hypertension Maternal Grandmother    Dementia Maternal Grandmother    Colon cancer Maternal Uncle 43   Crohn's disease Maternal Aunt    Breast cancer Maternal Aunt 65   Diabetes Maternal Aunt    Breast cancer Cousin 68       maternal   Cancer Cousin        m cousin breat cancer s/p removal both breasts    Social History   Socioeconomic History   Marital status: Single    Spouse name: Not on file   Number of children: 1   Years of education: Not on file   Highest education level: Not on file  Occupational History   Occupation: DISABLED    Employer: UNEMPLOYED  Tobacco Use   Smoking status: Never   Smokeless tobacco: Never  Vaping Use   Vaping Use: Never used  Substance and Sexual Activity   Alcohol use: No   Drug use: No   Sexual activity: Not Currently    Birth control/protection: None, Post-menopausal  Other Topics Concern   Not on file  Social History Narrative   She was previously a pediatrician, 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    She lives with daughter and mother      Social Determinants of Health   Financial Resource Strain: Low Risk    Difficulty of Paying Living Expenses: Not hard at all  Food Insecurity: No Food Insecurity   Worried About Charity fundraiser in the Last Year: Never true   Arboriculturist in the Last Year: Never true  Transportation Needs: No Transportation Needs   Lack of Transportation (Medical): No   Lack of Transportation (Non-Medical): No  Physical Activity: Not on file  Stress: No Stress Concern Present   Feeling of Stress : Not at all  Social Connections:  Unknown  Frequency of Communication with Friends and Family: More than three times a week   Frequency of Social Gatherings with Friends and Family: Not on file   Attends Religious Services: Not on Electrical engineer or Organizations: Not on file   Attends Archivist Meetings: Not on file   Marital Status: Not on file  Intimate Partner Violence: Not At Risk   Fear of Current or Ex-Partner: No   Emotionally Abused: No   Physically Abused: No   Sexually Abused: No   Current Meds  Medication Sig   acetaminophen (TYLENOL) 650 MG CR tablet Take 650 mg by mouth every 8 (eight) hours as needed for pain.   albuterol (VENTOLIN HFA) 108 (90 Base) MCG/ACT inhaler Inhale 1-2 puffs into the lungs every 4 (four) hours as needed for wheezing or shortness of breath.   ARIPiprazole (ABILIFY) 20 MG tablet Take 20 mg by mouth daily.   Ascorbic Acid (VITAMIN C) 500 MG CHEW Chew 2,500 mg by mouth daily.   aspirin 325 MG tablet Take 1 tablet (325 mg total) by mouth daily.   Biotin 10 MG CAPS Take by mouth.   Biotin 5000 MCG SUBL Place 10,000 mcg under the tongue daily.   carbidopa-levodopa (SINEMET CR) 50-200 MG tablet Take 1 tablet by mouth 5 (five) times daily.   Cholecalciferol (VITAMIN D3) 2000 units capsule Vitamin D3 2,000 unit capsule   dapsone 25 MG tablet Take by mouth daily.    dexmethylphenidate (FOCALIN XR) 20 MG 24 hr capsule dexmethylphenidate ER 20 mg capsule,extended release biphasic50-50   diclofenac sodium (VOLTAREN) 1 % GEL Apply 2 g topically 4 (four) times daily as needed (for knee & finger pain.).    dicyclomine (BENTYL) 10 MG capsule dicyclomine 10 mg capsule  every 8hours as needed   DULoxetine (CYMBALTA) 60 MG capsule Take 60 mg by mouth 2 (two) times daily.   EPINEPHrine 0.3 mg/0.3 mL IJ SOAJ injection Inject 0.3 mg into the muscle as needed for anaphylaxis.   fentaNYL (DURAGESIC - DOSED MCG/HR) 75 MCG/HR Place 75 mcg onto the skin every 3 (three) days.    Ferrous Sulfate (IRON PO) Take by mouth.   fluocinonide ointment (LIDEX) 0.05 % fluocinonide 0.05 % topical ointment  APPLY TO THE AFFECTED AREA(S) BY TOPICAL ROUTE 2 TIMES PER DAY   fluticasone-salmeterol (ADVAIR DISKUS) 250-50 MCG/ACT AEPB Inhale 1 puff into the lungs in the morning and at bedtime. Rinse mouth did not tolerate wixela brand only   folic acid (FOLVITE) 1 MG tablet Take 1 tablet (1 mg total) by mouth daily.   furosemide (LASIX) 20 MG tablet Take 1 tablet (20 mg total) by mouth daily as needed for edema or fluid.   hydrocortisone (ANUSOL-HC) 2.5 % rectal cream Proctozone-HC 2.5 % topical cream perineal applicator   hydrocortisone 2.5 % cream Apply topically 2 (two) times daily. Prn under arms   hydroxychloroquine (PLAQUENIL) 200 MG tablet Plaquenil 200 mg tablet  Take 1 tablet twice a day by oral route after meals for 90 days.   ibuprofen (ADVIL,MOTRIN) 800 MG tablet Take 1 tablet (800 mg total) by mouth every 8 (eight) hours as needed.   isosorbide mononitrate (IMDUR) 60 MG 24 hr tablet Take 1 tablet (60 mg total) by mouth daily.   ketorolac (TORADOL) 10 MG tablet Take 1 tablet (10 mg total) by mouth every 6 (six) hours as needed.   levothyroxine (SYNTHROID) 75 MCG tablet TAKE 1 TABLET BY MOUTH DAILY 30 MINUTES BEFORE  BREAKFAST   LORazepam (ATIVAN) 2 MG tablet Take 1-2 mg by mouth See admin instructions. 1 MG DAILY AS NEEDED FOR ANXIETY & SCHEDULED AT BEDTIME EACH NIGHT   methotrexate 250 MG/10ML injection Inject into the skin once a week.    methylphenidate 18 MG PO CR tablet Take by mouth.    montelukast (SINGULAIR) 10 MG tablet montelukast 10 mg tablet 1 pill qhs   Multiple Vitamins-Minerals (MULTI-VITAMIN GUMMIES PO) Multi Vitamin  GUMMIES   omeprazole (PRILOSEC) 40 MG capsule Take 40 mg by mouth 2 (two) times daily.   ondansetron (ZOFRAN) 4 MG tablet Take 1 tablet (4 mg total) by mouth every 8 (eight) hours as needed for nausea or vomiting.   Polyvinyl Alcohol-Povidone  (REFRESH OP) Place 1 drop into both eyes 2 (two) times daily.   predniSONE (DELTASONE) 10 MG tablet prednisone 10 mg tablet  1 daily   Probiotic Product (PROBIOTIC PO) Probiotic 10 billion cell capsule  daily   Sodium Chloride 3 % AERS Saline Nasal Mist 3 %  spray nose deeply every hour or two and blow nose   terbinafine (LAMISIL AT) 1 % cream Apply 1 application topically 2 (two) times daily.   tiZANidine (ZANAFLEX) 4 MG capsule Take by mouth.   topiramate (TOPAMAX) 100 MG tablet 100-150 mg. 100 in am and 150 qhs   Allergies  Allergen Reactions   Bee Venom Anaphylaxis, Itching and Swelling    Affected area Affected area Affected area Affected area    Fish Allergy Hives, Swelling and Rash    Facial swelling  Facial swelling  Facial swelling Facial swelling  Facial swelling    Latex Anaphylaxis, Rash and Shortness Of Breath    Rash  Rash     Prasterone Other (See Comments) and Nausea And Vomiting    rash Other reaction(s): Headache Headaches.   Shellfish Allergy Hives, Other (See Comments), Rash and Swelling    Facial swelling Uncoded Allergy. Allergen: seafood Uncoded Allergy. Allergen: CATS, Other Reaction: itch, wheezing Uncoded Allergy. Allergen: COMPAZINE, Other Reaction: tremors Facial swelling Facial swelling Uncoded Allergy. Allergen: seafood Uncoded Allergy. Allergen: CATS, Other Reaction: itch, wheezing Uncoded Allergy. Allergen: COMPAZINE, Other Reaction: tremors Facial swelling Uncoded Allergy. Allergen: seafood Uncoded Allergy. Allergen: CATS, Other Reaction: itch, wheezing Uncoded Allergy. Allergen: COMPAZINE, Other Reaction: tremors Facial swelling Facial swelling Uncoded Allergy. Allergen: seafood Uncoded Allergy. Allergen: CATS, Other Reaction: itch, wheezing Uncoded Allergy. Allergen: COMPAZINE, Other Reaction: tremors Facial swelling Facial swelling Uncoded Allergy. Allergen: seafood Uncoded Allergy. Allergen: CATS, Other Reaction: itch,  wheezing Uncoded Allergy. Allergen: COMPAZINE, Other Reaction: tremors    Shellfish-Derived Products Hives, Other (See Comments), Rash and Swelling    Facial swelling Uncoded Allergy. Allergen: seafood Uncoded Allergy. Allergen: CATS, Other Reaction: itch, wheezing Uncoded Allergy. Allergen: COMPAZINE, Other Reaction: tremors Facial swelling Facial swelling Uncoded Allergy. Allergen: seafood Uncoded Allergy. Allergen: CATS, Other Reaction: itch, wheezing Uncoded Allergy. Allergen: COMPAZINE, Other Reaction: tremors Facial swelling Uncoded Allergy. Allergen: seafood Uncoded Allergy. Allergen: CATS, Other Reaction: itch, wheezing Uncoded Allergy. Allergen: COMPAZINE, Other Reaction: tremors Facial swelling   Compazine [Prochlorperazine Edisylate] Other (See Comments)    tremors   Dhea [Nutritional Supplements] Other (See Comments)    Headaches.   Dilaudid [Hydromorphone Hcl]     ? reaction   Famotidine Other (See Comments) and Diarrhea    Other reaction(s): Headache Gas   Lactose    Lactose Intolerance (Gi)    Other Other (See Comments)    Other reaction(s): Unknown    Prochlorperazine  Other reaction(s): Other (See Comments) ticks   Promethazine     Other reaction(s): Other (See Comments) Ticks   Promethazine Hcl Other (See Comments)    CNS disorder   Reclast [Zoledronic Acid]     Weakness could not move limbs, fatigue, increase sleep   Clarithromycin Hives and Rash   Clotrimazole Swelling and Rash   Hydromorphone Hcl Rash and Hives    "Rash all over"   Penicillins Rash and Other (See Comments)    Has patient had a PCN reaction causing immediate rash, facial/tongue/throat swelling, SOB or lightheadedness with hypotension:No Has patient had a PCN reaction causing severe rash involving mucus membranes or skin necrosis:No Has patient had a PCN reaction that required hospitalization:No Has patient had a PCN reaction occurring within the last 10 years:No If all of the  above answers are "NO", then may proceed with Cephalosporin use.   Recent Results (from the past 2160 hour(s))  Comp Met (CMET)     Status: None   Collection Time: 08/26/21  8:55 AM  Result Value Ref Range   Sodium 140 135 - 145 mEq/L   Potassium 3.8 3.5 - 5.1 mEq/L   Chloride 106 96 - 112 mEq/L   CO2 25 19 - 32 mEq/L   Glucose, Bld 94 70 - 99 mg/dL   BUN 16 6 - 23 mg/dL   Creatinine, Ser 0.85 0.40 - 1.20 mg/dL   Total Bilirubin 0.3 0.2 - 1.2 mg/dL   Alkaline Phosphatase 84 39 - 117 U/L   AST 18 0 - 37 U/L   ALT 9 0 - 35 U/L   Total Protein 6.2 6.0 - 8.3 g/dL   Albumin 4.2 3.5 - 5.2 g/dL   GFR 76.36 >60.00 mL/min    Comment: Calculated using the CKD-EPI Creatinine Equation (2021)   Calcium 8.5 8.4 - 10.5 mg/dL  Lipid panel     Status: None   Collection Time: 08/26/21  8:55 AM  Result Value Ref Range   Cholesterol 166 0 - 200 mg/dL    Comment: ATP III Classification       Desirable:  < 200 mg/dL               Borderline High:  200 - 239 mg/dL          High:  > = 240 mg/dL   Triglycerides 66.0 0.0 - 149.0 mg/dL    Comment: Normal:  <150 mg/dLBorderline High:  150 - 199 mg/dL   HDL 61.30 >39.00 mg/dL   VLDL 13.2 0.0 - 40.0 mg/dL   LDL Cholesterol 91 0 - 99 mg/dL   Total CHOL/HDL Ratio 3     Comment:                Men          Women1/2 Average Risk     3.4          3.3Average Risk          5.0          4.42X Average Risk          9.6          7.13X Average Risk          15.0          11.0                       NonHDL 104.57     Comment: NOTE:  Non-HDL goal should be  30 mg/dL higher than patient's LDL goal (i.e. LDL goal of < 70 mg/dL, would have non-HDL goal of < 100 mg/dL)  TSH     Status: None   Collection Time: 08/26/21  8:55 AM  Result Value Ref Range   TSH 2.80 0.35 - 5.50 uIU/mL  HgB A1c     Status: None   Collection Time: 08/26/21  8:55 AM  Result Value Ref Range   Hgb A1c MFr Bld 5.8 4.6 - 6.5 %    Comment: Glycemic Control Guidelines for People with Diabetes:Non  Diabetic:  <6%Goal of Therapy: <7%Additional Action Suggested:  >8%    Objective  Body mass index is 30.28 kg/m. Wt Readings from Last 3 Encounters:  11/06/21 211 lb (95.7 kg)  08/26/21 212 lb 9.6 oz (96.4 kg)  05/07/21 207 lb (93.9 kg)   Temp Readings from Last 3 Encounters:  11/06/21 (!) 97.2 F (36.2 C) (Temporal)  08/26/21 98.6 F (37 C) (Oral)  05/07/21 98.2 F (36.8 C) (Oral)   BP Readings from Last 3 Encounters:  11/06/21 124/84  08/26/21 118/78  05/07/21 (!) 106/58   Pulse Readings from Last 3 Encounters:  11/06/21 90  08/26/21 90  05/07/21 78    Physical Exam Vitals and nursing note reviewed.  Constitutional:      Appearance: Normal appearance. She is well-developed and well-groomed.  HENT:     Head: Normocephalic and atraumatic.  Eyes:     Conjunctiva/sclera: Conjunctivae normal.     Pupils: Pupils are equal, round, and reactive to light.  Cardiovascular:     Rate and Rhythm: Normal rate and regular rhythm.     Heart sounds: Normal heart sounds. No murmur heard. Pulmonary:     Effort: Pulmonary effort is normal.     Breath sounds: Normal breath sounds.  Abdominal:     General: Abdomen is flat. Bowel sounds are normal.     Tenderness: There is no abdominal tenderness.  Musculoskeletal:        General: No tenderness.  Skin:    General: Skin is warm and dry.     Comments: Hyperpigmented raised rash under b/l arms ? Etiology dermatitis vs other acanthosis nigracans vs other  Neurological:     General: No focal deficit present.     Mental Status: She is alert and oriented to person, place, and time. Mental status is at baseline.     Cranial Nerves: Cranial nerves 2-12 are intact.     Gait: Gait is intact.  Psychiatric:        Attention and Perception: Attention and perception normal.        Mood and Affect: Mood and affect normal.        Speech: Speech normal.        Behavior: Behavior normal. Behavior is cooperative.        Thought Content: Thought  content normal.        Cognition and Memory: Cognition and memory normal.        Judgment: Judgment normal.    Assessment  Plan  Dermatitis - Plan: hydrocortisone 2.5 % cream  Dark urine ?etiology ?related lupus - Plan: Urinalysis, Routine w reflex microscopic, Microalbumin / creatinine urine ratio, Urine Culture, Myoglobin, urine, Sodium, urine, random, Basic Metabolic Panel (BMET)  B/l eyelid swelling could be allergic  Try cool compresses   Hypothyroidism, unspecified type - Plan: Ambulatory referral to Endocrinology [facility] app[facility] 02/16/22  Vitamin D deficiency - Plan: Ambulatory referral to Endocrinology Osteoporosis, unspecified osteoporosis  type, unspecified pathological fracture presence - Plan: Ambulatory referral to Endocrinology  Abnormal gait  Rx toilet seat elevator today   HM-CPE 12/2021 Had flu shot utd Had pna 23 in 2013 consider repeat in future  prevnar 09/03/2014 Tdap 10/19/16  covid vx had 4/4 pfizer    Think about shingrix vaccine in future prev discussed    Immune MMR and hep B   Colonoscopy 12/17/14 h/o polyps Dr. Rayann Heman IH/EH polyp hyperplastic consider repeat in 5-10 years FH m uncle colon cancer  EGD 08/27/2017 negative bxs +hiatal hernia  [facility]   DEXA 06/29/17 osteopenia h/o osteoporosis f/u [facility] in [place] -f/u [facility] Dr. Prudencio [facility] and he was to try Reclast again though listed on her allergy list he does not think true allergy    Mammogram 11/15/20 negative ordered pt needs to call and schedule   Pap 07/01/18 Dr. Marcelline Mates neg pap neg HPV  Consider repeat pap 3-5 years   See CT chest     IMPRESSION: 1. No CT evidence for acute pulmonary embolus. 2. Symmetric dependent ground-glass attenuation in the lungs, similar to prior study. Imaging features likely related to hypo expansion/ compressive atelectasis.   Dermatology Barnetta Chapel saw early 10/2019     rec vitamin D3 4000 Iu daily vitamin D 06/08/19 was 24    Vitamin B12 235  3/61/44  Folic acid >31 5/40/08    Eye exam had 10/2019 will return 01/28/20    Of note [facility]ology Dr. Nancy Fetter f/u sch in 01/2020, eye exam 01/28/20 to f/u HCQ monitoring  rec healthy diet and exercise      [facility] neuro Dr. Angela Nevin rheum Dr. Evette Cristal [facility] rheumatology [facility] as of 03/20/21 wants new referral [facility] Dr. Alice Reichert, Effie Berkshire  Cards Dr. Josefa Half Ortho Dr. Mendel Ryder endocrine Dr. Prudencio Burly >Dr. Mila Merry Provider: Dr. Olivia Mackie [facility]

## 2021-11-07 ENCOUNTER — Other Ambulatory Visit: Payer: Self-pay | Admitting: Internal Medicine

## 2021-11-07 DIAGNOSIS — R269 Unspecified abnormalities of gait and mobility: Secondary | ICD-10-CM | POA: Insufficient documentation

## 2021-11-07 LAB — URINALYSIS, ROUTINE W REFLEX MICROSCOPIC
Bilirubin, UA: NEGATIVE
Glucose, UA: NEGATIVE
Leukocytes,UA: NEGATIVE
Nitrite, UA: NEGATIVE
RBC, UA: NEGATIVE
Specific Gravity, UA: 1.024 (ref 1.005–1.030)
Urobilinogen, Ur: 0.2 mg/dL (ref 0.2–1.0)
pH, UA: 6 (ref 5.0–7.5)

## 2021-11-07 LAB — BASIC METABOLIC PANEL
BUN/Creatinine Ratio: 14 (ref 9–23)
BUN: 13 mg/dL (ref 6–24)
CO2: 21 mmol/L (ref 20–29)
Calcium: 8.9 mg/dL (ref 8.7–10.2)
Chloride: 106 mmol/L (ref 96–106)
Creatinine, Ser: 0.92 mg/dL (ref 0.57–1.00)
Glucose: 88 mg/dL (ref 70–99)
Potassium: 4.1 mmol/L (ref 3.5–5.2)
Sodium: 141 mmol/L (ref 134–144)
eGFR: 73 mL/min/{1.73_m2} (ref >59)

## 2021-11-07 LAB — MICROALBUMIN / CREATININE URINE RATIO
Creatinine, Urine: 231.9 mg/dL
Microalb/Creat Ratio: 6 mg/g creat (ref 0–29)
Microalbumin, Urine: 14.8 ug/mL

## 2021-11-07 LAB — MYOGLOBIN, URINE: Myoglobin, Ur: 3 ng/mL (ref 0–13)

## 2021-11-07 LAB — SODIUM, URINE, RANDOM: Sodium, Ur: 130 mmol/L

## 2021-11-10 ENCOUNTER — Telehealth: Payer: Self-pay | Admitting: Internal Medicine

## 2021-11-10 LAB — URINE CULTURE

## 2021-11-10 NOTE — Telephone Encounter (Signed)
Mirica from [facility] called in regards to pt and her mother. Pt was admitted into home health on 12/10 and has been ordered PT, OT, nursing, speech therapist, Education officer, museum. Pt is also having a level 1 medication interaction to hydroxychloroquine and zofran. Pt is also having a level 2 medication interaction with hydroxychloroquine and tizanidine.

## 2021-11-11 ENCOUNTER — Telehealth: Payer: Self-pay

## 2021-11-11 NOTE — Telephone Encounter (Signed)
-----   Message from Delorise Jackson, MD sent at 11/10/2021  9:13 AM EST ----- Kidneys normal  Urine no protein  Urine myoglobulin normal Urine culture not UTI

## 2021-11-11 NOTE — Telephone Encounter (Signed)
Called and spoke with Judson Roch with [facility]. States they will be faxing over orders to be signed.   Will await orders

## 2021-11-12 ENCOUNTER — Telehealth: Payer: Self-pay | Admitting: Internal Medicine

## 2021-11-12 NOTE — Telephone Encounter (Signed)
Noted ok  Dr. Gayland Curry

## 2021-11-12 NOTE — Telephone Encounter (Signed)
This should go to the patients PCP.

## 2021-11-12 NOTE — Telephone Encounter (Signed)
This is ok call back  She has ortho if having hip pain D.r Rudene Christians she needs to call for appt inform pt as well

## 2021-11-12 NOTE — Telephone Encounter (Signed)
Eric called from [place] requesting verbal orders to extend physical therapy 1x a week for 5 weeks. He saw the patient on 11/11/21. He also wanted the provider to know the patient is experiencing 81/2 out of 10 hip pain.   Emira Eubanks,cma

## 2021-11-12 NOTE — Telephone Encounter (Signed)
For your information  

## 2021-11-12 NOTE — Telephone Encounter (Signed)
Eric called from [place] requesting verbal orders to extend physical therapy 1x a week for 5 weeks. He saw the patient on 11/11/21. He also wanted the provider to know the patient is experiencing 81/2 out of 10 hip pain.

## 2021-11-12 NOTE — Telephone Encounter (Signed)
[name] [name] from [facility] called stating the patients care will be Delayed. The OT will not be able to go out until tomorrow , The social worker will not be able to come due to a Covid diagnosis may be 2 weeks before she will be able to come.

## 2021-11-13 NOTE — Telephone Encounter (Signed)
Okay for verbal orders. 

## 2021-11-13 NOTE — Telephone Encounter (Signed)
Ok verbal orders.

## 2021-11-13 NOTE — Telephone Encounter (Signed)
Ardelle Lesches from [facility] called requesting verbal orders for OT for pt and mother.   1 week 3 1q 2 week 2  Ardelle Lesches can be reached [phone] and the line is secure to leave vm for orders

## 2021-11-17 NOTE — Telephone Encounter (Signed)
Left detailed voicemail with okay for verbal orders

## 2021-11-18 ENCOUNTER — Telehealth: Payer: Self-pay | Admitting: Internal Medicine

## 2021-11-18 NOTE — Telephone Encounter (Signed)
[name] [name] from [facility], [phone]. She wanted Dr Olivia Mackie to know do to a computer issue on their side they had to cancel and re-enter orders. Dr Olivia Mackie at this time does not have do to anything. [name] [name] will be faxing over all the information to Dr Olivia Mackie.

## 2021-11-18 NOTE — Telephone Encounter (Signed)
Noted, will await new orders to sign and fax

## 2021-11-19 NOTE — Telephone Encounter (Signed)
Tonya from [facility] is requestign PT for the pt. She is requesting to meet 1x a week for 6 weeks for gate training and balance training.   She also wanted to report several drug interactions for pt.   Level 1- asprin/ ketorolac   -hydroxychloroquine/ zofran  -ibuprofen/ ketorolac  Level 2- carbidopa-levodopa/ ferrous sulfate   -ibuprofen/ methotrexate  -ketorolac/ methotrexate   -methotrexate/ omeprazole    Kenney Houseman can be reached at [phone]

## 2021-11-19 NOTE — Telephone Encounter (Signed)
Facility calling back in and informed by the front desk that orders will need to be faxed not verbal.

## 2021-11-26 ENCOUNTER — Telehealth: Payer: Self-pay | Admitting: Internal Medicine

## 2021-11-26 NOTE — Telephone Encounter (Signed)
[place] for this verbal

## 2021-11-26 NOTE — Telephone Encounter (Signed)
Janice Norrie Social Worker from [facility] called in stating that she needs a verbal order to move Pt visit from 11/25/2021 to 12/03/21. Hilda Blades stated that the verbal order is for an ESW Visit.   Hilda Blades  is requesting callback at [phone]

## 2021-11-26 NOTE — Telephone Encounter (Signed)
Ok for verbal 

## 2021-11-27 ENCOUNTER — Ambulatory Visit (INDEPENDENT_AMBULATORY_CARE_PROVIDER_SITE_OTHER): Payer: Medicare Other

## 2021-11-27 VITALS — Ht 70.0 in | Wt 211.0 lb

## 2021-11-27 DIAGNOSIS — Z0000 Encounter for general adult medical examination without abnormal findings: Secondary | ICD-10-CM

## 2021-11-27 NOTE — Progress Notes (Signed)
Subjective:   [name] [name] is a 55 y.o. female who presents for Medicare Annual (Subsequent) preventive examination.  Review of Systems    No ROS.  Medicare Wellness Virtual Visit.  Visual/audio telehealth visit, UTA vital signs.   See social history for additional risk factors.         Objective:    Today's Vitals   11/27/21 1317  Weight: 211 lb (95.7 kg)  Height: 5\' 10"  (1.778 m)   Body mass index is 30.28 kg/m.  Advanced Directives 05/07/2021 11/26/2020 11/21/2020 04/17/2020 10/10/2019 09/27/2019 05/08/2019  Does Patient Have a Medical Advance Directive? No No No No No No No  Would patient like information on creating a medical advance directive? - No - Patient declined No - Patient declined - - No - Patient declined No - Patient declined    Current Medications (verified) Outpatient Encounter Medications as of 11/27/2021  Medication Sig   acetaminophen (TYLENOL) 650 MG CR tablet Take 650 mg by mouth every 8 (eight) hours as needed for pain.   albuterol (VENTOLIN HFA) 108 (90 Base) MCG/ACT inhaler Inhale 1-2 puffs into the lungs every 4 (four) hours as needed for wheezing or shortness of breath.   ARIPiprazole (ABILIFY) 20 MG tablet Take 20 mg by mouth daily.   Ascorbic Acid (VITAMIN C) 500 MG CHEW Chew 2,500 mg by mouth daily.   aspirin 325 MG tablet Take 1 tablet (325 mg total) by mouth daily.   Biotin 10 MG CAPS Take by mouth.   Biotin 5000 MCG SUBL Place 10,000 mcg under the tongue daily.   carbidopa-levodopa (SINEMET CR) 50-200 MG tablet Take 1 tablet by mouth 5 (five) times daily.   Cholecalciferol (VITAMIN D3) 2000 units capsule Vitamin D3 2,000 unit capsule   dapsone 25 MG tablet Take by mouth daily.    dexmethylphenidate (FOCALIN XR) 20 MG 24 hr capsule dexmethylphenidate ER 20 mg capsule,extended release biphasic50-50   diclofenac sodium (VOLTAREN) 1 % GEL Apply 2 g topically 4 (four) times daily as needed (for knee & finger pain.).    dicyclomine (BENTYL)  10 MG capsule dicyclomine 10 mg capsule  every 8hours as needed   DULoxetine (CYMBALTA) 60 MG capsule Take 60 mg by mouth 2 (two) times daily.   EPINEPHrine 0.3 mg/0.3 mL IJ SOAJ injection Inject 0.3 mg into the muscle as needed for anaphylaxis.   fentaNYL (DURAGESIC - DOSED MCG/HR) 75 MCG/HR Place 75 mcg onto the skin every 3 (three) days.   Ferrous Sulfate (IRON PO) Take by mouth.   fluocinonide ointment (LIDEX) 0.05 % fluocinonide 0.05 % topical ointment  APPLY TO THE AFFECTED AREA(S) BY TOPICAL ROUTE 2 TIMES PER DAY   fluticasone-salmeterol (ADVAIR DISKUS) 250-50 MCG/ACT AEPB Inhale 1 puff into the lungs in the morning and at bedtime. Rinse mouth did not tolerate wixela brand only   folic acid (FOLVITE) 1 MG tablet Take 1 tablet (1 mg total) by mouth daily.   furosemide (LASIX) 20 MG tablet TAKE 1 TABLET(20 MG) BY MOUTH DAILY AS NEEDED FOR EDEMA OR SWELLING/FLUID   hydrocortisone (ANUSOL-HC) 2.5 % rectal cream Proctozone-HC 2.5 % topical cream perineal applicator   hydrocortisone 2.5 % cream Apply topically 2 (two) times daily. Prn under arms   hydroxychloroquine (PLAQUENIL) 200 MG tablet Plaquenil 200 mg tablet  Take 1 tablet twice a day by oral route after meals for 90 days.   ibuprofen (ADVIL,MOTRIN) 800 MG tablet Take 1 tablet (800 mg total) by mouth every 8 (eight) hours as  needed.   isosorbide mononitrate (IMDUR) 60 MG 24 hr tablet Take 1 tablet (60 mg total) by mouth daily.   ketorolac (TORADOL) 10 MG tablet Take 1 tablet (10 mg total) by mouth every 6 (six) hours as needed.   levothyroxine (SYNTHROID) 75 MCG tablet TAKE 1 TABLET BY MOUTH DAILY 30 MINUTES BEFORE BREAKFAST   LORazepam (ATIVAN) 2 MG tablet Take 1-2 mg by mouth See admin instructions. 1 MG DAILY AS NEEDED FOR ANXIETY & SCHEDULED AT BEDTIME EACH NIGHT   methotrexate 250 MG/10ML injection Inject into the skin once a week.    methylphenidate 18 MG PO CR tablet Take by mouth.    montelukast (SINGULAIR) 10 MG tablet  montelukast 10 mg tablet 1 pill qhs   Multiple Vitamins-Minerals (MULTI-VITAMIN GUMMIES PO) Multi Vitamin  GUMMIES   omeprazole (PRILOSEC) 40 MG capsule Take 40 mg by mouth 2 (two) times daily.   ondansetron (ZOFRAN) 4 MG tablet Take 1 tablet (4 mg total) by mouth every 8 (eight) hours as needed for nausea or vomiting.   Polyvinyl Alcohol-Povidone (REFRESH OP) Place 1 drop into both eyes 2 (two) times daily.   predniSONE (DELTASONE) 10 MG tablet prednisone 10 mg tablet  1 daily   Probiotic Product (PROBIOTIC PO) Probiotic 10 billion cell capsule  daily   Sodium Chloride 3 % AERS Saline Nasal Mist 3 %  spray nose deeply every hour or two and blow nose   terbinafine (LAMISIL AT) 1 % cream Apply 1 application topically 2 (two) times daily.   tiZANidine (ZANAFLEX) 4 MG capsule Take by mouth.   topiramate (TOPAMAX) 100 MG tablet 100-150 mg. 100 in am and 150 qhs   valACYclovir (VALTREX) 500 MG tablet valacyclovir 500 mg tablet  1 daily (Patient not taking: Reported on 11/06/2021)   No facility-administered encounter medications on file as of 11/27/2021.    Allergies (verified) Bee venom, Fish allergy, Latex, Prasterone, Shellfish allergy, Shellfish-derived products, Compazine [prochlorperazine edisylate], Dhea [nutritional supplements], Dilaudid [hydromorphone hcl], Famotidine, Lactose, Lactose intolerance (gi), Other, Prochlorperazine, Promethazine, Promethazine hcl, Reclast [zoledronic acid], Clarithromycin, Clotrimazole, Hydromorphone hcl, and Penicillins   History: Past Medical History:  Diagnosis Date   ADHD    Anemia    ANXIETY 06/06/2007   Arthritis    related to lupus   Asthma    BACK PAIN 02/22/2009   Cervical cancer ([place])    LEEP 2005    Chicken pox    Chronic pain    goes to pain management provider   Collagen vascular disease ([place])    Connective tissue disease ([place])    DEPRESSION 06/06/2007   Essential tremor    Fibromyalgia    Fibromyalgia    FREQUENCY, URINARY 07/07/2007    Gastritis    GERD 06/06/2007   Glaucoma    Headache    h/o migraines    History of hiatal hernia    History of kidney stones    HYPOTHYROIDISM 06/06/2007   INTERSTITIAL CYSTITIS 09/11/2010   Lupus ([place]) 2006   Lupus ([place])    Menopause    Migraine    Mitral valve regurgitation    NEPHROLITHIASIS, HX OF 11/05/2010   Neuropathy    Neuropathy    OCD (obsessive compulsive disorder)    Optic neuritis    2005   OSTEOPENIA 10/14/2009   Osteoporosis    OVERACTIVE BLADDER 10/14/2009   PAIN, CHRONIC NEC 06/06/2007   Panniculitis    Parkinson disease ([place]) 04/03/2019   POLYARTHRITIS 08/19/2007   Raynaud's disease  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 Marcelline Mates [facility]    Vasculitis [facility])    WEIGHT GAIN 02/22/2009   Past Surgical History:  Procedure Laterality Date   APPENDECTOMY     CERVICAL BIOPSY  W/ LOOP ELECTRODE EXCISION  2005   CHOLECYSTECTOMY     ESOPHAGOGASTRODUODENOSCOPY (EGD) WITH PROPOFOL N/A 08/27/2017   Procedure: ESOPHAGOGASTRODUODENOSCOPY (EGD) WITH PROPOFOL;  Surgeon: Manya Silvas, MD;  Location: [facility] ENDOSCOPY;  Service: Endoscopy;  Laterality: N/A;   HAND SURGERY     repair of left metatarsal    HARDWARE REMOVAL Right 02/16/2017   Procedure: HARDWARE REMOVAL FROM HIP;  Surgeon: Hessie Knows, MD;  Location: [facility] ORS;  Service: Orthopedics;  Laterality: Right;   JOINT REPLACEMENT     OTHER SURGICAL HISTORY     left 3rd metatarsal fracture repair 2011    REVISION TOTAL HIP ARTHROPLASTY  06/2009   replacement 2010 then screw and plate removal 9509; right hip   TONSILLECTOMY     TONSILLECTOMY     wrist  ligament repair bilateral     WRIST RECONSTRUCTION     WRIST SURGERY     laceration of right wrist ligaments   wrist surgery     laceration of left wrist surgery    Family History  Problem Relation Age of Onset   Hypertension Mother    Arthritis Mother    Heart disease Mother        afib   Asthma Sister     Asthma Daughter    Arthritis Daughter    Heart disease Daughter        ?heart condition on BB   ADD / ADHD Daughter    Pancreatic cancer Father    Cancer Father        pancreatitic    Diabetes Maternal Grandmother    Heart disease Maternal Grandmother    Arthritis Maternal Grandmother    Hypertension Maternal Grandmother    Dementia Maternal Grandmother    Colon cancer Maternal Uncle 60   Crohn's disease Maternal Aunt    Breast cancer Maternal Aunt 65   Diabetes Maternal Aunt    Breast cancer Cousin 30       maternal   Cancer Cousin        m cousin breat cancer s/p removal both breasts    Social History   Socioeconomic History   Marital status: Single    Spouse name: Not on file   Number of children: 1   Years of education: Not on file   Highest education level: Not on file  Occupational History   Occupation: DISABLED    Employer: UNEMPLOYED  Tobacco Use   Smoking status: Never   Smokeless tobacco: Never  Vaping Use   Vaping Use: Never used  Substance and Sexual Activity   Alcohol use: No   Drug use: No   Sexual activity: Not Currently    Birth control/protection: None, Post-menopausal  Other Topics Concern   Not on file  Social History Narrative   She was previously a pediatrician, 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    She lives with daughter and mother      Social Determinants of Health   Financial Resource Strain: Low Risk    Difficulty of Paying Living Expenses: Not hard at all  Food Insecurity: No Food Insecurity   Worried About Charity fundraiser in the Last Year: Never true  Ran Out of Food in the Last Year: Never true  Transportation Needs: No Transportation Needs   Lack of Transportation (Medical): No   Lack of Transportation (Non-Medical): No  Physical Activity: Insufficiently Active   Days of Exercise per Week: 7 days   Minutes of Exercise per Session: 20 min  Stress: No Stress Concern Present   Feeling of Stress : Not at  all  Social Connections: Unknown   Frequency of Communication with Friends and Family: More than three times a week   Frequency of Social Gatherings with Friends and Family: More than three times a week   Attends Religious Services: Not on Electrical engineer or Organizations: Not on file   Attends Archivist Meetings: Not on file   Marital Status: Not on file    Tobacco Counseling Counseling given: Not Answered   Clinical Intake:  Pre-visit preparation completed: Yes        Diabetes: No  How often do you need to have someone help you when you read instructions, pamphlets, or other written materials from your doctor or pharmacy?: 1 - Never    Interpreter Needed?: No      Activities of Daily Living In your present state of health, do you have any difficulty performing the following activities: 11/27/2021  Hearing? N  Vision? N  Difficulty concentrating or making decisions? N  Walking or climbing stairs? Y  Dressing or bathing? N  Doing errands, shopping? Y  Preparing Food and eating ? Y  Comment Mother prepares meals. Self feeds.  Using the Toilet? N  In the past six months, have you accidently leaked urine? N  Do you have problems with loss of bowel control? N  Managing your Medications? N  Managing your Finances? N  Housekeeping or managing your Housekeeping? N  Some recent data might be hidden    Patient Care Team: McLean-Scocuzza, Nino Glow, MD as PCP - General (Internal Medicine)  Indicate any recent Medical Services you may have received from other than [facility] providers in the past year (date may be approximate).     Assessment:   This is a routine wellness examination for Marien.  Virtual Visit via Telephone Note  I connected with  [name] [name] on 11/27/21 at  1:15 PM EST by telephone and verified that I am speaking with the correct person using two identifiers.  Persons participating in the virtual visit: patient/Nurse  Health Advisor   I discussed the limitations, risks, security and privacy concerns of performing an evaluation and management service by telephone and the availability of in person appointments. The patient expressed understanding and agreed to proceed.  Interactive audio and video telecommunications were attempted between this nurse and patient, however failed, due to patient having technical difficulties OR patient did not have access to video capability.  We continued and completed visit with audio only.  Some vital signs may be absent or patient reported.   Hearing/Vision screen Hearing Screening - Comments:: Patient is able to hear conversational tones without difficulty.  No issues reported.   Vision Screening - Comments:: Wears corrective lenses They have seen their ophthalmologist in the last 12 months.   Dietary issues and exercise activities discussed: Current Exercise Habits: Home exercise routine, Type of exercise: walking, Time (Minutes): 20, Frequency (Times/Week): 7, Weekly Exercise (Minutes/Week): 140 Regular diet Good water intake   Goals Addressed               This Visit's Progress  Patient Stated     I would like to lose a little weight (pt-stated)        Portion control meals Walk for exercise       Depression Screen PHQ 2/9 Scores 11/27/2021 08/26/2021 11/26/2020 11/17/2019 09/27/2019 07/20/2019 05/08/2019  PHQ - 2 Score 0 0 0 0 - 0 0  PHQ- 9 Score - - - - - - -  Exception Documentation - - - - Other- indicate reason in comment box - -    Fall Risk Fall Risk  11/27/2021 08/26/2021 11/26/2020 09/06/2020 06/05/2020  Falls in the past year? - 0 1 1 0  Comment - - - - -  Number falls in past yr: - 0 1 1 0  Injury with Fall? - - 1 1 0  Comment - - - - -  Risk for fall due to : History of fall(s);Impaired balance/gait - Impaired balance/gait;History of fall(s) History of fall(s);Impaired balance/gait -  Follow up Falls evaluation completed Falls  evaluation completed Falls evaluation completed Falls evaluation completed Falls evaluation completed    FALL RISK PREVENTION PERTAINING TO THE HOME: Home free of loose throw rugs in walkways, pet beds, electrical cords, etc? Yes  Adequate lighting in your home to reduce risk of falls? Yes   ASSISTIVE DEVICES UTILIZED TO PREVENT FALLS: Life alert? No  Use of a cane, walker or w/c? Yes  Grab bars in the bathroom? Yes  Shower chair or bench in shower? Yes  Elevated toilet seat or a handicapped toilet? Yes  TIMED UP AND GO: Was the test performed? No .   Cognitive Function:  Patient is alert and oriented x3.    6CIT Screen 11/26/2020  What Year? 0 points  What month? 0 points  What time? 0 points  Count back from 20 0 points  Months in reverse 0 points  Repeat phrase 0 points  Total Score 0    Immunizations Immunization History  Administered Date(s) Administered   Influenza Inj Mdck Quad Pf 08/30/2017   Influenza Split 08/13/2011, 08/19/2012, 09/18/2013   Influenza Whole 09/13/2007, 09/12/2008, 08/06/2010   Influenza,inj,Quad PF,6+ Mos 08/13/2011, 08/19/2012, 09/18/2013, 08/21/2014, 09/18/2014, 09/02/2015, 09/01/2016, 08/30/2017, 08/19/2018, 07/28/2019, 08/06/2020, 08/21/2021   Influenza-Unspecified 09/02/2015, 08/30/2017   PFIZER(Purple Top)SARS-COV-2 Vaccination 03/19/2020, 04/09/2020, 11/06/2020   Pfizer Covid-19 Vaccine Bivalent Booster 12yrs & up 10/21/2021   Pneumococcal Conjugate-13 11/30/2004, 06/04/2014, 09/03/2014   Pneumococcal Polysaccharide-23 11/30/2004, 09/05/2012   Td 11/30/2004   Tdap 09/11/2014, 11/05/2014, 10/19/2016   Shingrix Completed?: No.    Education has been provided regarding the importance of this vaccine. Patient has been advised to call insurance company to determine out of pocket expense if they have not yet received this vaccine. Advised may also receive vaccine at local pharmacy or Health Dept. Verbalized acceptance and  understanding.  Screening Tests Health Maintenance  Topic Date Due   PAP SMEAR-Modifier  11/27/2021 (Originally 07/01/2021)   Zoster Vaccines- Shingrix (1 of 2) 02/25/2022 (Originally 10/22/1985)   MAMMOGRAM  11/15/2022   COLONOSCOPY (Pts 45-31yrs Insurance coverage will need to be confirmed)  12/17/2024   TETANUS/TDAP  10/19/2026   Pneumococcal Vaccine 9-40 Years old (4 - PPSV23 if available, else PCV20) 10/21/2029   INFLUENZA VACCINE  Completed   COVID-19 Vaccine  Completed   Hepatitis C Screening  Completed   HIV Screening  Completed   HPV VACCINES  Aged Out   FOOT EXAM  Discontinued   HEMOGLOBIN A1C  Discontinued   OPHTHALMOLOGY EXAM  Discontinued   URINE MICROALBUMIN  Discontinued   Health Maintenance There are no preventive care reminders to display for this patient.  Mammogram- plans to schedule.  Lung Cancer Screening: (Low Dose CT Chest recommended if Age 42-80 years, 30 pack-year currently smoking OR have quit w/in 15years.) does not qualify.   Vision Screening: Recommended annual ophthalmology exams for early detection of glaucoma and other disorders of the eye.  Dental Screening: Recommended annual dental exams for proper oral hygiene  Community Resource Referral / Chronic Care Management: CRR required this visit?  No   CCM required this visit?  No      Plan:   Keep all routine maintenance appointments.   I have personally reviewed and noted the following in the patients chart:   Medical and social history Use of alcohol, tobacco or illicit drugs  Current medications and supplements including opioid prescriptions. Not taking opioid.  Functional ability and status Nutritional status Physical activity Advanced directives List of other physicians Hospitalizations, surgeries, and ER visits in previous 12 months Vitals Screenings to include cognitive, depression, and falls Referrals and appointments  In addition, I have reviewed and discussed with  patient certain preventive protocols, quality metrics, and best practice recommendations. A written personalized care plan for preventive services as well as general preventive health recommendations were provided to patient.     Varney Biles, LPN   [id number]

## 2021-11-27 NOTE — Patient Instructions (Addendum)
°  [name] , Thank you for taking time to come for your Medicare Wellness Visit. I appreciate your ongoing commitment to your health goals. Please review the following plan we discussed and let me know if I can assist you in the future.   These are the goals we discussed:  Goals       Patient Stated     I would like to lose a little weight (pt-stated)      Portion control meals Walk for exercise      Other     Follow up with Primary Care Provider      As needed        This is a list of the screening recommended for you and due dates:  Health Maintenance  Topic Date Due   Pap Smear  11/27/2021*   Zoster (Shingles) Vaccine (1 of 2) 02/25/2022*   Mammogram  11/15/2022   Colon Cancer Screening  12/17/2024   Tetanus Vaccine  10/19/2026   Pneumococcal Vaccination (4 - PPSV23 if available, else PCV20) 10/21/2029   Flu Shot  Completed   COVID-19 Vaccine  Completed   Hepatitis C Screening: USPSTF Recommendation to screen - Ages 18-79 yo.  Completed   HIV Screening  Completed   HPV Vaccine  Aged Out   Complete foot exam   Discontinued   Hemoglobin A1C  Discontinued   Eye exam for diabetics  Discontinued   Urine Protein Check  Discontinued  *Topic was postponed. The date shown is not the original due date.

## 2021-11-27 NOTE — Telephone Encounter (Signed)
I called and spoke with Jackelyn Poling from [facility] & verbal orders given.

## 2022-01-05 DIAGNOSIS — K219 Gastro-esophageal reflux disease without esophagitis: Secondary | ICD-10-CM | POA: Diagnosis not present

## 2022-01-05 DIAGNOSIS — G894 Chronic pain syndrome: Secondary | ICD-10-CM | POA: Diagnosis not present

## 2022-01-05 DIAGNOSIS — M85841 Other specified disorders of bone density and structure, right hand: Secondary | ICD-10-CM | POA: Diagnosis not present

## 2022-01-05 DIAGNOSIS — Z91040 Latex allergy status: Secondary | ICD-10-CM | POA: Diagnosis not present

## 2022-01-05 DIAGNOSIS — M797 Fibromyalgia: Secondary | ICD-10-CM | POA: Diagnosis not present

## 2022-01-05 DIAGNOSIS — Z91010 Allergy to peanuts: Secondary | ICD-10-CM | POA: Diagnosis not present

## 2022-01-05 DIAGNOSIS — E039 Hypothyroidism, unspecified: Secondary | ICD-10-CM | POA: Diagnosis not present

## 2022-01-05 DIAGNOSIS — M85842 Other specified disorders of bone density and structure, left hand: Secondary | ICD-10-CM | POA: Diagnosis not present

## 2022-01-05 DIAGNOSIS — M7989 Other specified soft tissue disorders: Secondary | ICD-10-CM | POA: Diagnosis not present

## 2022-01-05 DIAGNOSIS — K760 Fatty (change of) liver, not elsewhere classified: Secondary | ICD-10-CM | POA: Diagnosis not present

## 2022-01-05 DIAGNOSIS — Z79899 Other long term (current) drug therapy: Secondary | ICD-10-CM | POA: Diagnosis not present

## 2022-01-05 DIAGNOSIS — M359 Systemic involvement of connective tissue, unspecified: Secondary | ICD-10-CM | POA: Diagnosis not present

## 2022-01-05 DIAGNOSIS — M79641 Pain in right hand: Secondary | ICD-10-CM | POA: Diagnosis not present

## 2022-01-05 DIAGNOSIS — G8929 Other chronic pain: Secondary | ICD-10-CM | POA: Diagnosis not present

## 2022-01-05 DIAGNOSIS — G20 Parkinson's disease: Secondary | ICD-10-CM | POA: Diagnosis not present

## 2022-01-05 DIAGNOSIS — Z880 Allergy status to penicillin: Secondary | ICD-10-CM | POA: Diagnosis not present

## 2022-01-05 DIAGNOSIS — F419 Anxiety disorder, unspecified: Secondary | ICD-10-CM | POA: Diagnosis not present

## 2022-01-05 DIAGNOSIS — Z91013 Allergy to seafood: Secondary | ICD-10-CM | POA: Diagnosis not present

## 2022-01-05 DIAGNOSIS — F32A Depression, unspecified: Secondary | ICD-10-CM | POA: Diagnosis not present

## 2022-01-05 DIAGNOSIS — Z888 Allergy status to other drugs, medicaments and biological substances status: Secondary | ICD-10-CM | POA: Diagnosis not present

## 2022-01-05 DIAGNOSIS — M79642 Pain in left hand: Secondary | ICD-10-CM | POA: Diagnosis not present

## 2022-01-05 DIAGNOSIS — Z885 Allergy status to narcotic agent status: Secondary | ICD-10-CM | POA: Diagnosis not present

## 2022-01-06 DIAGNOSIS — E785 Hyperlipidemia, unspecified: Secondary | ICD-10-CM | POA: Diagnosis not present

## 2022-01-06 DIAGNOSIS — M3214 Glomerular disease in systemic lupus erythematosus: Secondary | ICD-10-CM | POA: Diagnosis not present

## 2022-01-06 DIAGNOSIS — E039 Hypothyroidism, unspecified: Secondary | ICD-10-CM | POA: Diagnosis not present

## 2022-01-06 DIAGNOSIS — F02A4 Dementia in other diseases classified elsewhere, mild, with anxiety: Secondary | ICD-10-CM | POA: Diagnosis not present

## 2022-01-06 DIAGNOSIS — M797 Fibromyalgia: Secondary | ICD-10-CM | POA: Diagnosis not present

## 2022-01-06 DIAGNOSIS — G629 Polyneuropathy, unspecified: Secondary | ICD-10-CM | POA: Diagnosis not present

## 2022-01-06 DIAGNOSIS — E559 Vitamin D deficiency, unspecified: Secondary | ICD-10-CM | POA: Diagnosis not present

## 2022-01-06 DIAGNOSIS — G309 Alzheimer's disease, unspecified: Secondary | ICD-10-CM | POA: Diagnosis not present

## 2022-01-06 DIAGNOSIS — R1310 Dysphagia, unspecified: Secondary | ICD-10-CM | POA: Diagnosis not present

## 2022-01-06 DIAGNOSIS — G894 Chronic pain syndrome: Secondary | ICD-10-CM | POA: Diagnosis not present

## 2022-01-06 DIAGNOSIS — H409 Unspecified glaucoma: Secondary | ICD-10-CM | POA: Diagnosis not present

## 2022-01-06 DIAGNOSIS — G4700 Insomnia, unspecified: Secondary | ICD-10-CM | POA: Diagnosis not present

## 2022-01-06 DIAGNOSIS — F909 Attention-deficit hyperactivity disorder, unspecified type: Secondary | ICD-10-CM | POA: Diagnosis not present

## 2022-01-06 DIAGNOSIS — D649 Anemia, unspecified: Secondary | ICD-10-CM | POA: Diagnosis not present

## 2022-01-06 DIAGNOSIS — G20 Parkinson's disease: Secondary | ICD-10-CM | POA: Diagnosis not present

## 2022-01-06 DIAGNOSIS — F32A Depression, unspecified: Secondary | ICD-10-CM | POA: Diagnosis not present

## 2022-01-07 ENCOUNTER — Ambulatory Visit
Admission: RE | Admit: 2022-01-07 | Discharge: 2022-01-07 | Disposition: A | Discharge status: Home / Self Care | Payer: Medicare Other | Source: Ambulatory Visit | Attending: Anesthesiology | Admitting: Anesthesiology

## 2022-01-07 ENCOUNTER — Ambulatory Visit
Admission: RE | Admit: 2022-01-07 | Discharge: 2022-01-07 | Disposition: A | Discharge status: Home / Self Care | Payer: Medicare Other | Attending: Anesthesiology | Admitting: Anesthesiology

## 2022-01-07 ENCOUNTER — Other Ambulatory Visit: Payer: Self-pay | Admitting: Anesthesiology

## 2022-01-07 DIAGNOSIS — M25519 Pain in unspecified shoulder: Secondary | ICD-10-CM

## 2022-01-07 DIAGNOSIS — M25532 Pain in left wrist: Secondary | ICD-10-CM | POA: Diagnosis not present

## 2022-01-07 DIAGNOSIS — M25511 Pain in right shoulder: Secondary | ICD-10-CM | POA: Diagnosis not present

## 2022-01-07 DIAGNOSIS — M545 Low back pain, unspecified: Secondary | ICD-10-CM | POA: Diagnosis not present

## 2022-01-07 DIAGNOSIS — M25562 Pain in left knee: Secondary | ICD-10-CM | POA: Diagnosis not present

## 2022-01-07 DIAGNOSIS — M25531 Pain in right wrist: Secondary | ICD-10-CM | POA: Diagnosis not present

## 2022-01-07 DIAGNOSIS — F3181 Bipolar II disorder: Secondary | ICD-10-CM | POA: Diagnosis not present

## 2022-01-07 DIAGNOSIS — M25512 Pain in left shoulder: Secondary | ICD-10-CM | POA: Diagnosis not present

## 2022-01-13 ENCOUNTER — Telehealth: Payer: Self-pay | Admitting: Internal Medicine

## 2022-01-13 NOTE — Telephone Encounter (Signed)
[name] [name] from [place] is missing orders dated 1/25/, order # [id number]. Please refax.

## 2022-01-19 DIAGNOSIS — E559 Vitamin D deficiency, unspecified: Secondary | ICD-10-CM | POA: Diagnosis not present

## 2022-01-19 DIAGNOSIS — M40204 Unspecified kyphosis, thoracic region: Secondary | ICD-10-CM | POA: Diagnosis not present

## 2022-01-19 DIAGNOSIS — M818 Other osteoporosis without current pathological fracture: Secondary | ICD-10-CM | POA: Diagnosis not present

## 2022-01-19 DIAGNOSIS — T380X5A Adverse effect of glucocorticoids and synthetic analogues, initial encounter: Secondary | ICD-10-CM | POA: Diagnosis not present

## 2022-01-19 DIAGNOSIS — M359 Systemic involvement of connective tissue, unspecified: Secondary | ICD-10-CM | POA: Diagnosis not present

## 2022-01-19 DIAGNOSIS — R29890 Loss of height: Secondary | ICD-10-CM | POA: Diagnosis not present

## 2022-01-19 NOTE — Telephone Encounter (Signed)
Mariann Laster is calling about missing orders

## 2022-01-21 ENCOUNTER — Other Ambulatory Visit: Payer: Self-pay

## 2022-01-21 ENCOUNTER — Encounter: Payer: Self-pay | Admitting: Internal Medicine

## 2022-01-21 ENCOUNTER — Ambulatory Visit (INDEPENDENT_AMBULATORY_CARE_PROVIDER_SITE_OTHER): Payer: Medicare Other | Admitting: Internal Medicine

## 2022-01-21 ENCOUNTER — Telehealth: Payer: Self-pay | Admitting: Internal Medicine

## 2022-01-21 VITALS — BP 132/68 | HR 71 | Temp 98.2°F | Resp 16 | Ht 65.0 in | Wt 214.0 lb

## 2022-01-21 DIAGNOSIS — R7303 Prediabetes: Secondary | ICD-10-CM | POA: Diagnosis not present

## 2022-01-21 DIAGNOSIS — Z23 Encounter for immunization: Secondary | ICD-10-CM | POA: Diagnosis not present

## 2022-01-21 DIAGNOSIS — N644 Mastodynia: Secondary | ICD-10-CM

## 2022-01-21 DIAGNOSIS — G629 Polyneuropathy, unspecified: Secondary | ICD-10-CM | POA: Diagnosis not present

## 2022-01-21 DIAGNOSIS — M818 Other osteoporosis without current pathological fracture: Secondary | ICD-10-CM

## 2022-01-21 DIAGNOSIS — M8580 Other specified disorders of bone density and structure, unspecified site: Secondary | ICD-10-CM

## 2022-01-21 DIAGNOSIS — R29890 Loss of height: Secondary | ICD-10-CM

## 2022-01-21 DIAGNOSIS — Z1231 Encounter for screening mammogram for malignant neoplasm of breast: Secondary | ICD-10-CM | POA: Diagnosis not present

## 2022-01-21 DIAGNOSIS — Z0001 Encounter for general adult medical examination with abnormal findings: Secondary | ICD-10-CM | POA: Diagnosis not present

## 2022-01-21 DIAGNOSIS — M797 Fibromyalgia: Secondary | ICD-10-CM

## 2022-01-21 DIAGNOSIS — T380X5A Adverse effect of glucocorticoids and synthetic analogues, initial encounter: Secondary | ICD-10-CM

## 2022-01-21 DIAGNOSIS — E2839 Other primary ovarian failure: Secondary | ICD-10-CM

## 2022-01-21 DIAGNOSIS — E039 Hypothyroidism, unspecified: Secondary | ICD-10-CM

## 2022-01-21 LAB — POCT GLYCOSYLATED HEMOGLOBIN (HGB A1C): Hemoglobin A1C: 6 % — AB (ref 4.0–5.6)

## 2022-01-21 MED ORDER — SHINGRIX 50 MCG/0.5ML IM SUSR: 0.5000 mL | Freq: Once | INTRAMUSCULAR | 1 refills | Status: AC

## 2022-01-21 MED ORDER — LEVOTHYROXINE SODIUM 75 MCG PO TABS: ORAL_TABLET | ORAL | 3 refills | Status: DC

## 2022-01-21 NOTE — Progress Notes (Signed)
Chief Complaint  Patient presents with   Annual Exam   Annual 1. C/o left breast pain 3-6 oclock dog 2 days ago jumped on left breast rad to left shoulder but no left shoulder pain  2. Vit D def 01/19/22 vit D 27 per [facility] on vitamin D orally  3. Pna 23 due  4. C/o numbness/tingling in hands and feet and 01/2020 EMG +neuropathy h/o fibromyalgia as well 5. 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  HENT:  Negative for hearing loss.   Eyes:  Negative for blurred vision.  Respiratory:  Negative for shortness of breath.   Cardiovascular:  Negative for chest pain.  Gastrointestinal:  Negative for abdominal pain and blood in stool.  Genitourinary:  Negative for dysuria.  Musculoskeletal:  Negative for falls and joint pain.  Skin:  Negative for rash.  Neurological:  Negative for headaches.  Psychiatric/Behavioral:  Negative for depression.   Past Medical History:  Diagnosis Date   ADHD    Anemia    ANXIETY 06/06/2007   Arthritis    related to lupus   Asthma    BACK PAIN 02/22/2009   Cervical cancer ([place])    LEEP 2005    Chicken pox    Chronic pain    goes to pain management provider   Collagen vascular disease ([place])    Connective tissue disease ([place])    DEPRESSION 06/06/2007   Essential tremor    Fibromyalgia    Fibromyalgia    FREQUENCY, URINARY 07/07/2007   Gastritis    GERD 06/06/2007   Glaucoma    Headache    h/o migraines    History of hiatal hernia    History of kidney stones    HYPOTHYROIDISM 06/06/2007   INTERSTITIAL CYSTITIS 09/11/2010   Lupus ([place]) 2006   Lupus ([place])    Menopause    Migraine    Mitral valve regurgitation    NEPHROLITHIASIS, HX OF 11/05/2010   Neuropathy    Neuropathy    OCD (obsessive compulsive disorder)    Optic neuritis    2005   OSTEOPENIA 10/14/2009   Osteoporosis    OVERACTIVE BLADDER 10/14/2009   PAIN, CHRONIC NEC 06/06/2007   Panniculitis     Parkinson disease ([place]) 04/03/2019   POLYARTHRITIS 08/19/2007   Raynaud's disease    Sicca syndrome ([place])    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 Marcelline Mates [facility]    Vasculitis [facility])    WEIGHT GAIN 02/22/2009   Past Surgical History:  Procedure Laterality Date   APPENDECTOMY     CERVICAL BIOPSY  W/ LOOP ELECTRODE EXCISION  2005   CHOLECYSTECTOMY     ESOPHAGOGASTRODUODENOSCOPY (EGD) WITH PROPOFOL N/A 08/27/2017   Procedure: ESOPHAGOGASTRODUODENOSCOPY (EGD) WITH PROPOFOL;  Surgeon: Manya Silvas, MD;  Location: [facility] ENDOSCOPY;  Service: Endoscopy;  Laterality: N/A;   HAND SURGERY     repair of left metatarsal    HARDWARE REMOVAL Right 02/16/2017   Procedure: HARDWARE REMOVAL FROM HIP;  Surgeon: Hessie Knows, MD;  Location: [facility] ORS;  Service: Orthopedics;  Laterality: Right;   JOINT REPLACEMENT     OTHER SURGICAL HISTORY     left 3rd metatarsal fracture repair 2011    REVISION TOTAL HIP ARTHROPLASTY  06/2009   replacement 2010 then screw and plate removal 8341; right hip   TONSILLECTOMY     TONSILLECTOMY     wrist  ligament repair bilateral     WRIST RECONSTRUCTION     WRIST SURGERY     laceration of right wrist ligaments   wrist surgery     laceration of left wrist surgery    Family History  Problem Relation Age of Onset   Hypertension Mother    Arthritis Mother    Heart disease Mother        afib   Asthma Sister    Asthma Daughter    Arthritis Daughter    Heart disease Daughter        ?heart condition on BB   ADD / ADHD Daughter    Pancreatic cancer Father    Cancer Father        pancreatitic    Diabetes Maternal Grandmother    Heart disease Maternal Grandmother    Arthritis Maternal Grandmother    Hypertension Maternal Grandmother    Dementia Maternal Grandmother    Colon cancer Maternal Uncle 50   Crohn's disease Maternal Aunt    Breast cancer Maternal Aunt 65   Diabetes Maternal Aunt    Breast cancer Cousin 58       maternal    Cancer Cousin        m cousin breat cancer s/p removal both breasts    Social History   Socioeconomic History   Marital status: Single    Spouse name: Not on file   Number of children: 1   Years of education: Not on file   Highest education level: Not on file  Occupational History   Occupation: DISABLED    Employer: UNEMPLOYED  Tobacco Use   Smoking status: Never   Smokeless tobacco: Never  Vaping Use   Vaping Use: Never used  Substance and Sexual Activity   Alcohol use: No   Drug use: No   Sexual activity: Not Currently    Birth control/protection: None, Post-menopausal  Other Topics Concern   Not on file  Social History Narrative   She was previously a pediatrician, 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    She lives with daughter and mother      Social Determinants of Health   Financial Resource Strain: Low Risk    Difficulty of Paying Living Expenses: Not hard at all  Food Insecurity: No Food Insecurity   Worried About Charity fundraiser in the Last Year: Never true   Arboriculturist in the Last Year: Never true  Transportation Needs: No Transportation Needs   Lack of Transportation (Medical): No   Lack of Transportation (Non-Medical): No  Physical Activity: Insufficiently Active   Days of Exercise per Week: 7 days   Minutes of Exercise per Session: 20 min  Stress: No Stress Concern Present   Feeling of Stress : Not at all  Social Connections: Unknown   Frequency of Communication with Friends and Family: More than three times a week   Frequency of Social Gatherings with Friends and Family: More than three times a week   Attends Religious Services: Not on Electrical engineer or Organizations: Not on file   Attends Archivist Meetings: Not on file   Marital Status: Not on file  Intimate Partner Violence: Not At Risk   Fear of Current or Ex-Partner: No   Emotionally Abused: No   Physically Abused: No   Sexually Abused: No    Current Meds  Medication Sig   methylphenidate 18 MG PO CR tablet Take 18  mg by mouth daily.   Zoster Vaccine Adjuvanted [facility]) injection Inject 0.5 mLs into the muscle once for 1 dose.   Allergies  Allergen Reactions   Bee Venom Anaphylaxis, Itching and Swelling    Affected area Affected area Affected area Affected area    Fish Allergy Hives, Swelling and Rash    Facial swelling  Facial swelling  Facial swelling Facial swelling  Facial swelling    Latex Anaphylaxis, Rash and Shortness Of Breath    Rash  Rash     Prasterone Other (See Comments) and Nausea And Vomiting    rash Other reaction(s): Headache Headaches.   Shellfish Allergy Hives, Other (See Comments), Rash and Swelling    Facial swelling Uncoded Allergy. Allergen: seafood Uncoded Allergy. Allergen: CATS, Other Reaction: itch, wheezing Uncoded Allergy. Allergen: COMPAZINE, Other Reaction: tremors Facial swelling Facial swelling Uncoded Allergy. Allergen: seafood Uncoded Allergy. Allergen: CATS, Other Reaction: itch, wheezing Uncoded Allergy. Allergen: COMPAZINE, Other Reaction: tremors Facial swelling Uncoded Allergy. Allergen: seafood Uncoded Allergy. Allergen: CATS, Other Reaction: itch, wheezing Uncoded Allergy. Allergen: COMPAZINE, Other Reaction: tremors Facial swelling Facial swelling Uncoded Allergy. Allergen: seafood Uncoded Allergy. Allergen: CATS, Other Reaction: itch, wheezing Uncoded Allergy. Allergen: COMPAZINE, Other Reaction: tremors Facial swelling Facial swelling Uncoded Allergy. Allergen: seafood Uncoded Allergy. Allergen: CATS, Other Reaction: itch, wheezing Uncoded Allergy. Allergen: COMPAZINE, Other Reaction: tremors    Shellfish-Derived Products Hives, Other (See Comments), Rash and Swelling    Facial swelling Uncoded Allergy. Allergen: seafood Uncoded Allergy. Allergen: CATS, Other Reaction: itch, wheezing Uncoded Allergy. Allergen: COMPAZINE, Other Reaction:  tremors Facial swelling Facial swelling Uncoded Allergy. Allergen: seafood Uncoded Allergy. Allergen: CATS, Other Reaction: itch, wheezing Uncoded Allergy. Allergen: COMPAZINE, Other Reaction: tremors Facial swelling Uncoded Allergy. Allergen: seafood Uncoded Allergy. Allergen: CATS, Other Reaction: itch, wheezing Uncoded Allergy. Allergen: COMPAZINE, Other Reaction: tremors Facial swelling   Compazine [Prochlorperazine Edisylate] Other (See Comments)    tremors   Dhea [Nutritional Supplements] Other (See Comments)    Headaches.   Dilaudid [Hydromorphone Hcl]     ? reaction   Famotidine Other (See Comments) and Diarrhea    Other reaction(s): Headache Gas   Lactose    Lactose Intolerance (Gi)    Other Other (See Comments)    Other reaction(s): Unknown    Prochlorperazine     Other reaction(s): Other (See Comments) ticks   Promethazine     Other reaction(s): Other (See Comments) Ticks   Promethazine Hcl Other (See Comments)    CNS disorder   Reclast [Zoledronic Acid]     Weakness could not move limbs, fatigue, increase sleep   Clarithromycin Hives and Rash   Clotrimazole Swelling and Rash   Hydromorphone Hcl Rash and Hives    "Rash all over"   Penicillins Rash and Other (See Comments)    Has patient had a PCN reaction causing immediate rash, facial/tongue/throat swelling, SOB or lightheadedness with hypotension:No Has patient had a PCN reaction causing severe rash involving mucus membranes or skin necrosis:No Has patient had a PCN reaction that required hospitalization:No Has patient had a PCN reaction occurring within the last 10 years:No If all of the above answers are "NO", then may proceed with Cephalosporin use.   Recent Results (from the past 2160 hour(s))  Urinalysis, Routine w reflex microscopic     Status: Abnormal   Collection Time: 11/06/21 11:37 AM  Result Value Ref Range   Specific Gravity, UA 1.024 1.005 - 1.030   pH, UA 6.0 5.0 - 7.5   Color, UA Yellow  Yellow   Appearance Ur Clear Clear   Leukocytes,UA Negative Negative   Protein,UA Trace Negative/Trace   Glucose, UA Negative Negative   Ketones, UA Trace (A) Negative   RBC, UA Negative Negative   Bilirubin, UA Negative Negative   Urobilinogen, Ur 0.2 0.2 - 1.0 mg/dL   Nitrite, UA Negative Negative   Microscopic Examination Comment     Comment: Microscopic not indicated and not performed.  Microalbumin / creatinine urine ratio     Status: None   Collection Time: 11/06/21 11:37 AM  Result Value Ref Range   Creatinine, Urine 231.9 Not Estab. mg/dL   Microalbumin, Urine 14.8 Not Estab. ug/mL   Microalb/Creat Ratio 6 0 - 29 mg/g creat    Comment:                        Normal:                0 -  29                        Moderately increased: 30 - 300                        Severely increased:       >300   Myoglobin, urine     Status: None   Collection Time: 11/06/21 11:37 AM  Result Value Ref Range   Myoglobin, Ur 3 0 - 13 ng/mL  Urine Culture     Status: None   Collection Time: 11/06/21 11:38 AM   Specimen: Urine   UR  Result Value Ref Range   Urine Culture, Routine Final report    Organism ID, Bacteria Comment     Comment: Mixed urogenital flora Less than 10,000 colonies/mL   Sodium, urine, random     Status: None   Collection Time: 11/06/21 11:39 AM  Result Value Ref Range   Sodium, Ur 130 Not Estab. mmol/L  Basic Metabolic Panel (BMET)     Status: None   Collection Time: 11/06/21 11:39 AM  Result Value Ref Range   Glucose 88 70 - 99 mg/dL   BUN 13 6 - 24 mg/dL   Creatinine, Ser 0.92 0.57 - 1.00 mg/dL   eGFR 73 >59 mL/min/1.73   BUN/Creatinine Ratio 14 9 - 23   Sodium 141 134 - 144 mmol/L   Potassium 4.1 3.5 - 5.2 mmol/L   Chloride 106 96 - 106 mmol/L   CO2 21 20 - 29 mmol/L   Calcium 8.9 8.7 - 10.2 mg/dL  POCT glycosylated hemoglobin (Hb A1C)     Status: Abnormal   Collection Time: 01/21/22  2:31 PM  Result Value Ref Range   Hemoglobin A1C 6.0 (A) 4.0 - 5.6  %   HbA1c POC (<> result, manual entry)     HbA1c, POC (prediabetic range)     HbA1c, POC (controlled diabetic range)     Objective  Body mass index is 35.61 kg/m. Wt Readings from Last 3 Encounters:  01/21/22 214 lb (97.1 kg)  11/27/21 211 lb (95.7 kg)  11/06/21 211 lb (95.7 kg)   Temp Readings from Last 3 Encounters:  01/21/22 98.2 F (36.8 C) (Oral)  11/06/21 (!) 97.2 F (36.2 C) (Temporal)  08/26/21 98.6 F (37 C) (Oral)   BP Readings from Last 3 Encounters:  01/21/22 132/68  11/06/21 124/84  08/26/21 118/78   Pulse Readings from Last 3 Encounters:  01/21/22 71  11/06/21 90  08/26/21 90    Physical Exam Vitals and nursing note reviewed.  Constitutional:      Appearance: Normal appearance. She is well-developed and well-groomed.  HENT:     Head: Normocephalic and atraumatic.  Eyes:     Conjunctiva/sclera: Conjunctivae normal.     Pupils: Pupils are equal, round, and reactive to light.  Cardiovascular:     Rate and Rhythm: Normal rate and regular rhythm.     Heart sounds: Normal heart sounds. No murmur heard. Pulmonary:     Effort: Pulmonary effort is normal.     Breath sounds: Normal breath sounds.  Chest:     Chest wall: No mass.  Breasts:    Right: Normal.     Left: Tenderness present.     Comments: Left breast pain 3-6 oclock  Abdominal:     General: Abdomen is flat. Bowel sounds are normal.     Tenderness: There is no abdominal tenderness.  Musculoskeletal:        General: No tenderness.  Lymphadenopathy:     Upper Body:     Right upper body: No axillary adenopathy.     Left upper body: No axillary adenopathy.  Skin:    General: Skin is warm and dry.  Neurological:     General: No focal deficit present.     Mental Status: She is alert and oriented to person, place, and time. Mental status is at baseline.     Cranial Nerves: Cranial nerves 2-12 are intact.     Motor: Motor function is intact.     Coordination: Coordination is intact.      Gait: Gait is intact.     Comments: Bl walks with cane +tremor   Psychiatric:        Attention and Perception: Attention and perception normal.        Mood and Affect: Mood and affect normal.        Speech: Speech normal.        Behavior: Behavior normal. Behavior is cooperative.        Thought Content: Thought content normal.        Cognition and Memory: Cognition and memory normal.        Judgment: Judgment normal.    Assessment  Plan  Abnormal physical evaluation See below   Breast pain, left - Plan: MM DIAG BREAST TOMO UNI LEFT, US BREAST LTD UNI LEFT INC AXILLA, MS DIGITAL SCREENING TOMO UNI RIGHT  Screening mammogram, encounter for - Plan: MS DIGITAL SCREENING TOMO UNI RIGHT  Peripheral polyneuropathy f/u neurology/fibromyalgia Dr. Manuella Ghazi and [place] [facility]  Prediabetes - Plan: POCT glycosylated hemoglobin (Hb A1C) 6.0  Rec healthy diet and exercise   Osteopenia, unspecified location - Plan: DG Bone Density Estrogen deficiency - Plan: DG Bone Density Height loss - Plan: DG Bone Density Steroid-induced osteoporosis - Plan: DG Bone Density   Need for prophylactic vaccination against Streptococcus pneumoniae (pneumococcus) - Plan: Pneumococcal polysaccharide vaccine 23-valent greater than or equal to 2yo subcutaneous/IM  HM-CPE today Had flu shot utd Had pna 23 today  prevnar 09/03/2014 Tdap 10/19/16  covid vx had 4/4 pfizer per pt had 5 shots HT [facility] in [place] about shingrix vaccine in future prev discussed rx today   Immune MMR and hep B   Colonoscopy 12/17/14 h/o polyps Dr. Rayann Heman IH/EH polyp hyperplastic consider repeat in 5-10 years FH m uncle colon cancer  EGD 08/27/2017 negative bxs +hiatal  hernia  Est [facility]    DEXA 06/29/17 osteopenia h/o osteoporosis f/u [facility] in [place] -f/u endocrine [facility] Dr. Prudencio Burly [facility] and he was to try Reclast again though listed on her allergy list he does not think true allergy  Ordered    Mammogram  11/15/20 negative ordered pt needs to call and schedule   Pap 07/01/18 Dr. Marcelline Mates neg pap neg HPV  Consider repeat pap 3-5 years  Pt to call to schedule  See CT chest     IMPRESSION: 1. No CT evidence for acute pulmonary embolus. 2. Symmetric dependent ground-glass attenuation in the lungs, similar to prior study. Imaging features likely related to hypo expansion/ compressive atelectasis.   Dermatology Barnetta Chapel saw early 10/2019     rec vitamin D3 4000 Iu daily vitamin D 06/08/19 was 24    Vitamin G28 366 2/94/76  Folic acid >54 6/50/35    Eye exam had 10/2019 will return 01/28/20    Of note [facility] rheumatology Dr. Nancy Fetter f/u sch in 01/2020, eye exam 01/28/20 to f/u HCQ monitoring  rec healthy diet and exercise      [facility] neuro Dr. Angela Nevin rheum Dr. Evette Cristal [facility] rheumatology [facility] as of 03/20/21 wants new referral [facility] Dr. Alice Reichert, Effie [place]  Cards Dr. Josefa Half Ortho Dr. Mendel Ryder endocrine Dr. Prudencio Burly >Dr. Jamelle Haring [facility] rheumatology Dr. Domenica Fail         Provider: Dr. Olivia Mackie [facility]oc[facility]

## 2022-01-21 NOTE — Telephone Encounter (Signed)
Lft pt vm to call of[facility] to sch Diag mammo and or call [place] to [facility] I lft [place] number on vm as well. thanks

## 2022-01-21 NOTE — Addendum Note (Signed)
Addended by: Orland Mustard on: 01/21/2022 05:20 PM   Modules accepted: Orders

## 2022-01-21 NOTE — Patient Instructions (Addendum)
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 Breast tenderness may range from mild discomfort to severe pain. In women, the pain usually comes and goes with the menstrual cycle, but it can also be constant. Breast tenderness has many possible causes, including hormone changes, infections, and taking certain medicines. You may have tests, such as a mammogram or an ultrasound, to check for any unusual findings. Having breast tenderness usually does not mean that you have breast cancer. Follow these instructions at home: Managing pain and discomfort  If directed, put ice to the painful area. To do this: Put ice in a plastic bag. Place a towel between your skin and the bag. Leave the ice on for 20 minutes, 2-3 times a day. Wear a supportive bra, especially during exercise. You may also want to wear a supportive bra while sleeping if your breasts are very tender. Medicines Take over-the-counter and prescription medicines only as told by your health care provider. If the cause of your pain is infection, you may be prescribed an antibiotic medicine. If you were prescribed an antibiotic, take it as told by your health care provider. Do not stop taking the antibiotic even if you start to feel better. Eating and drinking Your health care provider may recommend that you lessen the amount of fat in your diet. You can do this by: Limiting fried foods. Cooking  foods using methods such as baking, boiling, grilling, and broiling. Decrease the amount of caffeine in your diet. Instead, drink more water and choose caffeine-free drinks. General instructions  Keep a log of the days and times when your breasts are most tender. Ask your health care provider how to do breast exams at home. This will help you notice if you have an unusual growth or lump. Keep all follow-up visits as told by your health care provider. This is important. Contact a health care provider if: Any part of your breast is hard, red, and hot to the touch. This may be a sign of infection. You are a woman and: Not breastfeeding and you have fluid, especially blood or pus, coming out of your nipples. Have a new or painful lump in your breast that remains after your menstrual period ends. You have a fever. Your pain does not improve or it gets worse. Your pain is interfering with your daily activities. Summary Breast tenderness may range from mild discomfort to severe pain. Breast tenderness has many possible causes, including hormone changes, infections, and taking certain medicines. It can be treated with ice, wearing a supportive bra, and medicines. Make changes to your diet if told to by your health care provider. This information is not intended to replace advice given to you by your health care provider. Make sure you discuss any questions you have with your health care provider. Document Revised: 04/10/2019 Document Reviewed: 04/10/2019 Elsevier Patient Education  2022 [place].  Myofascial Pain Syndrome and Fibromyalgia Myofascial pain syndrome and fibromyalgia are  both pain disorders. This pain may be felt mainly in your muscles. Myofascial pain syndrome: Always has tender points in the muscle that will cause pain when pressed (trigger points). The pain may come and go. Usually affects your neck, upper back, and shoulder areas. The pain often radiates into your arms and  hands. Fibromyalgia: Has muscle pains and tenderness that come and go. Is often associated with fatigue and sleep problems. Has trigger points. Tends to be long-lasting (chronic), but is not life-threatening. Fibromyalgia and myofascial pain syndrome are not the same. However, they often occur together. If you have both conditions, each can make the other worse. Both are common and can cause enough pain and fatigue to make day-to-day activities difficult. Both can be hard to diagnose because their symptoms are common in many other conditions. What are the causes? The exact causes of these conditions are not known. What increases the risk? You are more likely to develop this condition if: You have a family history of the condition. You have certain triggers, such as: Spine disorders. An injury (trauma) or other physical stressors. Being under a lot of stress. Medical conditions such as osteoarthritis, rheumatoid arthritis, or lupus. What are the signs or symptoms? Fibromyalgia The main symptom of fibromyalgia is widespread pain and tenderness in your muscles. Pain is sometimes described as stabbing, shooting, or burning. You may also have: Tingling or numbness. Sleep problems and fatigue. Problems with attention and concentration (fibro fog). Other symptoms may include:  Bowel and bladder problems. Headaches. Visual problems. Problems with odors and noises. Depression or mood changes. Painful menstrual periods (dysmenorrhea). Dry skin or eyes. These symptoms can vary over time. Myofascial pain syndrome Symptoms of myofascial pain syndrome include: Tight, ropy bands of muscle. Uncomfortable sensations in muscle areas. These may include aching, cramping, burning, numbness, tingling, and weakness. Difficulty moving certain parts of the body freely (poor range of motion). How is this diagnosed? This condition may be diagnosed by your symptoms and medical history. You will also have  a physical exam. In general: Fibromyalgia is diagnosed if you have pain, fatigue, and other symptoms for more than 3 months, and symptoms cannot be explained by another condition. Myofascial pain syndrome is diagnosed if you have trigger points in your muscles, and those trigger points are tender and cause pain elsewhere in your body (referred pain). How is this treated? Treatment for these conditions depends on the type that you have. For fibromyalgia: Pain medicines, such as NSAIDs. Medicines for treating depression. Medicines for treating seizures. Medicines that relax the muscles. For myofascial pain: Pain medicines, such as NSAIDs. Cooling and stretching of muscles. Trigger point injections. Sound wave (ultrasound) treatments to stimulate muscles. Treating these conditions often requires a team of health care providers. These may include: Your primary care provider. Physical therapist. Complementary health care providers, such as massage therapists or acupuncturists. Psychiatrist for cognitive behavioral therapy. Follow these instructions at home: Medicines Take over-the-counter and prescription medicines only as told by your health care provider. Do not drive or use heavy machinery while taking prescription pain medicine. If you are taking prescription pain medicine, take actions to prevent or treat constipation. Your health care provider may recommend that you: Drink enough fluid to keep your urine pale yellow. Eat foods that are high in fiber, such as fresh fruits and vegetables, whole grains, and beans. Limit foods that are high in fat and processed sugars, such as fried or sweet foods. Take an over-the-counter or prescription medicine for constipation.  Lifestyle  Exercise as directed by your health care provider or physical therapist. Practice relaxation techniques to control your stress. You may want to try: Biofeedback. Visual imagery. Hypnosis. Muscle  relaxation. Yoga. Meditation. Maintain a healthy lifestyle. This includes eating a healthy diet and getting enough sleep. Do not use any products that contain nicotine or tobacco, such as cigarettes and e-cigarettes. If you need help quitting, ask your health care provider. General instructions Talk to your health care provider about complementary treatments, such as acupuncture or massage. Consider joining a support group with others who are diagnosed with this condition. Do not do activities that stress or strain your muscles. This includes repetitive motions and heavy lifting. Keep all follow-up visits as told by your health care provider. This is important. Where to find more information National Fibromyalgia Association: www.fmaware.[place]: www.arthritis.org American Chronic Pain Association: www.theacpa.org Contact a health care provider if: You have new symptoms. Your symptoms get worse or your pain is severe. You have side effects from your medicines. You have trouble sleeping. Your condition is causing depression or anxiety. Summary Myofascial pain syndrome and fibromyalgia are pain disorders. Myofascial pain syndrome has tender points in the muscle that will cause pain when pressed (trigger points). Fibromyalgia also has muscle pains and tenderness that come and go, but this condition is often associated with fatigue and sleep disturbances. Fibromyalgia and myofascial pain syndrome are not the same but often occur together, causing pain and fatigue that make day-to-day activities difficult. Treatment for fibromyalgia includes taking medicines to relax the muscles and medicines for pain, depression, or seizures. Treatment for myofascial pain syndrome includes taking medicines for pain, cooling and stretching of muscles, and injecting medicines into trigger points. Follow your health care provider's instructions for taking medicines and maintaining a healthy  lifestyle. This information is not intended to replace advice given to you by your health care provider. Make sure you discuss any questions you have with your health care provider. Document Revised: 03/10/2019 Document Reviewed: 12/01/2017 Elsevier Patient Education  2022 [place].  Neuropathic Pain Neuropathic pain is pain caused by damage to the nerves that are responsible for certain sensations in your body (sensory nerves). Neuropathic pain can make you more sensitive to pain. Even a minor sensation can feel very painful. This is usually a long-term (chronic) condition that can be difficult to treat. The type of pain differs from person to person. It may: Start suddenly (acute), or it may develop slowly and become chronic. Come and go as damaged nerves heal, or it may stay at the same level for years. Cause emotional distress, loss of sleep, and a lower quality of life. What are the causes? The most common cause of this condition is diabetes. Many other diseases and conditions can also cause neuropathic pain. Causes of neuropathic pain can be classified as: Toxic. This is caused by medicines and chemicals. The most common causes of toxic neuropathic pain is damage from medicines that kill cancer cells (chemotherapy) or alcohol abuse. Metabolic. This can be caused by: Diabetes. Lack of vitamins like B12. Traumatic. Any injury that cuts, crushes, or stretches a nerve can cause damage and pain. Compression-related. If a sensory nerve gets trapped or compressed for a long period of time, the blood supply to the nerve can be cut off. Vascular. Many blood vessel diseases can cause neuropathic pain by decreasing blood supply and oxygen to nerves. Autoimmune. This type of pain results from diseases in which the body's defense system (immune  system) mistakenly attacks sensory nerves. Examples of autoimmune diseases that can cause neuropathic pain include lupus and multiple  sclerosis. Infectious. Many types of viral infections can damage sensory nerves and cause pain. Shingles infection is a common cause of this type of pain. Inherited. Neuropathic pain can be a symptom of many diseases that are passed down through families (genetic). What increases the risk? You are more likely to develop this condition if: You have diabetes. You smoke. You drink too much alcohol. You are taking certain medicines, including chemotherapy or medicines that treat immune system disorders. What are the signs or symptoms? The main symptom is pain. Neuropathic pain is often described as: Burning. Shock-like. Stinging. Hot or cold. Itching. How is this diagnosed? No single test can diagnose neuropathic pain. It is diagnosed based on: A physical exam and your symptoms. Your health care provider will ask you about your pain. You may be asked to use a pain scale to describe how bad your pain is. Tests. These may be done to see if you have a cause and location of any nerve damage. They include: Nerve conduction studies and electromyography to test how well nerve signals travel through your nerves and muscles (electrodiagnostic testing). Skin biopsy to evaluate for small fiber neuropathy. Imaging studies, such as: X-rays. CT scan. MRI. How is this treated? Treatment for neuropathic pain may change over time. You may need to try different treatment options or a combination of treatments. Some options include: Treating the underlying cause of the neuropathy, such as diabetes, kidney disease, or vitamin deficiencies. Stopping medicines that can cause neuropathy, such as chemotherapy. Medicine to relieve pain. Medicines may include: Prescription or over-the-counter pain medicine. Anti-seizure medicine. Antidepressant medicines. Pain-relieving patches or creams that are applied to painful areas of skin. A medicine to numb the area (local anesthetic), which can be injected as a nerve  block. Transcutaneous nerve stimulation. This uses electrical currents to block painful nerve signals. The treatment is painless. Alternative treatments, such as: Acupuncture. Meditation. Massage. Occupational or physical therapy. Pain management programs. Counseling. Follow these instructions at home: Medicines  Take over-the-counter and prescription medicines only as told by your health care provider. Ask your health care provider if the medicine prescribed to you: Requires you to avoid driving or using machinery. Can cause constipation. You may need to take these actions to prevent or treat constipation: Drink enough fluid to keep your urine pale yellow. Take over-the-counter or prescription medicines. Eat foods that are high in fiber, such as beans, whole grains, and fresh fruits and vegetables. Limit foods that are high in fat and processed sugars, such as fried or sweet foods. Lifestyle  Have a good support system at home. Consider joining a chronic pain support group. Do not use any products that contain nicotine or tobacco. These products include cigarettes, chewing tobacco, and vaping devices, such as e-cigarettes. If you need help quitting, ask your health care provider. Do not drink alcohol. General instructions Learn as much as you can about your condition. Work closely with all your health care providers to find the treatment plan that works best for you. Ask your health care provider what activities are safe for you. Keep all follow-up visits. This is important. Contact a health care provider if: Your pain treatments are not working. You are having side effects from your medicines. You are struggling with tiredness (fatigue), mood changes, depression, or anxiety. Get help right away if: You have thoughts of hurting yourself. Get help right away if  you feel like you may hurt yourself or others, or have thoughts about taking your own life. Go to your nearest emergency  room or: Call 911. Call the [facility] at [phone] or 988. This is open 24 hours a day. Text the Crisis Text Line at 346-321-1972. Summary Neuropathic pain is pain caused by damage to the nerves that are responsible for certain sensations in your body (sensory nerves). Neuropathic pain may come and go as damaged nerves heal, or it may stay at the same level for years. Neuropathic pain is usually a long-term condition that can be difficult to treat. Consider joining a chronic pain support group. This information is not intended to replace advice given to you by your health care provider. Make sure you discuss any questions you have with your health care provider. Document Revised: 07/14/2021 Document Reviewed: 07/14/2021 Elsevier Patient Education  2022 [employer].

## 2022-01-21 NOTE — Telephone Encounter (Signed)
Have been attempting to fax orders to [facility] on multiple Patient's. Have been getting fax failed notifications due to the number ringing busy/no signal. Will fax all [name] [name] order again first thing tomorrow morning when Their fax lines are hopefully not busy.

## 2022-01-22 ENCOUNTER — Telehealth: Payer: Self-pay

## 2022-01-22 ENCOUNTER — Encounter: Payer: Self-pay | Admitting: *Deleted

## 2022-01-22 NOTE — Telephone Encounter (Signed)
Lvm for pt to return call in regards to labs.  ?

## 2022-01-23 ENCOUNTER — Other Ambulatory Visit: Payer: Self-pay | Admitting: Family Medicine

## 2022-01-23 ENCOUNTER — Other Ambulatory Visit: Payer: Self-pay | Admitting: Internal Medicine

## 2022-01-23 DIAGNOSIS — N644 Mastodynia: Secondary | ICD-10-CM

## 2022-01-23 DIAGNOSIS — Z1231 Encounter for screening mammogram for malignant neoplasm of breast: Secondary | ICD-10-CM

## 2022-01-23 NOTE — Telephone Encounter (Signed)
Pt called in stating that a nurse from our office called her about her A1C. Pt returning call. Pt requesting callback

## 2022-01-26 NOTE — Telephone Encounter (Signed)
Thressa Sheller, CMA  01/26/2022 11:21 AM EST Back to Top    Patient called back in. Left message to return call when calling Patient back.    Leeanne Rio, CMA  01/22/2022  4:22 PM EST     Sent mychart message also   Arsenio Katz, [place]  01/22/2022  8:58 AM EST     LMTCB   Nino Glow McLean-Scocuzza, MD  01/21/2022  2:44 PM EST     A1c worse still prediabetes + from 5.8

## 2022-02-03 DIAGNOSIS — F3181 Bipolar II disorder: Secondary | ICD-10-CM | POA: Diagnosis not present

## 2022-02-03 DIAGNOSIS — T380X5A Adverse effect of glucocorticoids and synthetic analogues, initial encounter: Secondary | ICD-10-CM | POA: Diagnosis not present

## 2022-02-03 DIAGNOSIS — M818 Other osteoporosis without current pathological fracture: Secondary | ICD-10-CM | POA: Diagnosis not present

## 2022-02-05 ENCOUNTER — Ambulatory Visit
Admission: RE | Admit: 2022-02-05 | Discharge: 2022-02-05 | Disposition: A | Discharge status: Home / Self Care | Payer: Medicare Other | Source: Ambulatory Visit | Attending: Internal Medicine | Admitting: Internal Medicine

## 2022-02-05 ENCOUNTER — Other Ambulatory Visit: Payer: Self-pay

## 2022-02-05 DIAGNOSIS — Z1231 Encounter for screening mammogram for malignant neoplasm of breast: Secondary | ICD-10-CM

## 2022-02-05 DIAGNOSIS — M8580 Other specified disorders of bone density and structure, unspecified site: Secondary | ICD-10-CM

## 2022-02-05 DIAGNOSIS — R922 Inconclusive mammogram: Secondary | ICD-10-CM | POA: Diagnosis not present

## 2022-02-05 DIAGNOSIS — T380X5A Adverse effect of glucocorticoids and synthetic analogues, initial encounter: Secondary | ICD-10-CM | POA: Insufficient documentation

## 2022-02-05 DIAGNOSIS — R29890 Loss of height: Secondary | ICD-10-CM | POA: Insufficient documentation

## 2022-02-05 DIAGNOSIS — M818 Other osteoporosis without current pathological fracture: Secondary | ICD-10-CM | POA: Insufficient documentation

## 2022-02-05 DIAGNOSIS — N644 Mastodynia: Secondary | ICD-10-CM

## 2022-02-05 DIAGNOSIS — M8589 Other specified disorders of bone density and structure, multiple sites: Secondary | ICD-10-CM | POA: Diagnosis not present

## 2022-02-05 DIAGNOSIS — E2839 Other primary ovarian failure: Secondary | ICD-10-CM | POA: Insufficient documentation

## 2022-02-09 DIAGNOSIS — F3181 Bipolar II disorder: Secondary | ICD-10-CM | POA: Diagnosis not present

## 2022-02-09 DIAGNOSIS — F419 Anxiety disorder, unspecified: Secondary | ICD-10-CM | POA: Diagnosis not present

## 2022-02-09 DIAGNOSIS — G479 Sleep disorder, unspecified: Secondary | ICD-10-CM | POA: Diagnosis not present

## 2022-02-23 ENCOUNTER — Telehealth: Payer: Self-pay | Admitting: Internal Medicine

## 2022-02-23 NOTE — Telephone Encounter (Signed)
Pt came into office to drop off handicap form. Placed in provider colored folder ?

## 2022-02-24 ENCOUNTER — Ambulatory Visit: Payer: Medicare Other | Admitting: Internal Medicine

## 2022-02-25 NOTE — Telephone Encounter (Signed)
Called and informed the Patient that form is ready. Patient states she would like this mailed to her. Verified address on file and mailed form to the Patient.  ?

## 2022-03-04 DIAGNOSIS — F3181 Bipolar II disorder: Secondary | ICD-10-CM | POA: Diagnosis not present

## 2022-03-05 ENCOUNTER — Encounter: Payer: Self-pay | Admitting: Internal Medicine

## 2022-03-18 DIAGNOSIS — M25562 Pain in left knee: Secondary | ICD-10-CM | POA: Diagnosis not present

## 2022-03-18 DIAGNOSIS — M25531 Pain in right wrist: Secondary | ICD-10-CM | POA: Diagnosis not present

## 2022-03-18 DIAGNOSIS — M545 Low back pain, unspecified: Secondary | ICD-10-CM | POA: Diagnosis not present

## 2022-03-18 DIAGNOSIS — Z79899 Other long term (current) drug therapy: Secondary | ICD-10-CM | POA: Diagnosis not present

## 2022-03-18 DIAGNOSIS — M25532 Pain in left wrist: Secondary | ICD-10-CM | POA: Diagnosis not present

## 2022-03-18 DIAGNOSIS — F1110 Opioid abuse, uncomplicated: Secondary | ICD-10-CM | POA: Diagnosis not present

## 2022-03-31 ENCOUNTER — Other Ambulatory Visit: Payer: Self-pay | Admitting: Internal Medicine

## 2022-03-31 DIAGNOSIS — J4520 Mild intermittent asthma, uncomplicated: Secondary | ICD-10-CM

## 2022-03-31 MED ORDER — ALBUTEROL SULFATE (2.5 MG/3ML) 0.083% IN NEBU: 2.5000 mg | INHALATION_SOLUTION | Freq: Four times a day (QID) | RESPIRATORY_TRACT | 12 refills | Status: DC | PRN

## 2022-04-01 DIAGNOSIS — F3181 Bipolar II disorder: Secondary | ICD-10-CM | POA: Diagnosis not present

## 2022-04-10 DIAGNOSIS — R4189 Other symptoms and signs involving cognitive functions and awareness: Secondary | ICD-10-CM | POA: Diagnosis not present

## 2022-04-10 DIAGNOSIS — G20 Parkinson's disease: Secondary | ICD-10-CM | POA: Diagnosis not present

## 2022-04-10 DIAGNOSIS — R1310 Dysphagia, unspecified: Secondary | ICD-10-CM | POA: Diagnosis not present

## 2022-04-10 DIAGNOSIS — G43119 Migraine with aura, intractable, without status migrainosus: Secondary | ICD-10-CM | POA: Diagnosis not present

## 2022-04-13 DIAGNOSIS — Z79899 Other long term (current) drug therapy: Secondary | ICD-10-CM | POA: Diagnosis not present

## 2022-04-13 DIAGNOSIS — M25562 Pain in left knee: Secondary | ICD-10-CM | POA: Diagnosis not present

## 2022-04-13 DIAGNOSIS — M25531 Pain in right wrist: Secondary | ICD-10-CM | POA: Diagnosis not present

## 2022-04-13 DIAGNOSIS — M25551 Pain in right hip: Secondary | ICD-10-CM | POA: Diagnosis not present

## 2022-04-13 DIAGNOSIS — M25532 Pain in left wrist: Secondary | ICD-10-CM | POA: Diagnosis not present

## 2022-04-13 DIAGNOSIS — G894 Chronic pain syndrome: Secondary | ICD-10-CM | POA: Diagnosis not present

## 2022-04-21 DIAGNOSIS — H04123 Dry eye syndrome of bilateral lacrimal glands: Secondary | ICD-10-CM | POA: Diagnosis not present

## 2022-04-21 DIAGNOSIS — H40053 Ocular hypertension, bilateral: Secondary | ICD-10-CM | POA: Diagnosis not present

## 2022-04-21 DIAGNOSIS — Z79899 Other long term (current) drug therapy: Secondary | ICD-10-CM | POA: Diagnosis not present

## 2022-04-29 ENCOUNTER — Other Ambulatory Visit: Payer: Self-pay | Admitting: Internal Medicine

## 2022-04-29 ENCOUNTER — Encounter: Payer: Self-pay | Admitting: Internal Medicine

## 2022-04-29 ENCOUNTER — Ambulatory Visit (INDEPENDENT_AMBULATORY_CARE_PROVIDER_SITE_OTHER): Payer: Medicare Other | Admitting: Internal Medicine

## 2022-04-29 VITALS — BP 128/78 | HR 102 | Temp 98.0°F | Resp 18

## 2022-04-29 DIAGNOSIS — G43911 Migraine, unspecified, intractable, with status migrainosus: Secondary | ICD-10-CM

## 2022-04-29 DIAGNOSIS — R631 Polydipsia: Secondary | ICD-10-CM

## 2022-04-29 DIAGNOSIS — R350 Frequency of micturition: Secondary | ICD-10-CM | POA: Diagnosis not present

## 2022-04-29 DIAGNOSIS — Z20822 Contact with and (suspected) exposure to covid-19: Secondary | ICD-10-CM

## 2022-04-29 DIAGNOSIS — G43111 Migraine with aura, intractable, with status migrainosus: Secondary | ICD-10-CM

## 2022-04-29 DIAGNOSIS — F3181 Bipolar II disorder: Secondary | ICD-10-CM | POA: Diagnosis not present

## 2022-04-29 DIAGNOSIS — R3589 Other polyuria: Secondary | ICD-10-CM | POA: Diagnosis not present

## 2022-04-29 LAB — POCT GLYCOSYLATED HEMOGLOBIN (HGB A1C): Hemoglobin A1C: 6 % — AB (ref 4.0–5.6)

## 2022-04-29 LAB — GLUCOSE, POCT (MANUAL RESULT ENTRY): POC Glucose: 116 mg/dl — AB (ref 70–99)

## 2022-04-29 MED ORDER — METHYLPREDNISOLONE ACETATE 40 MG/ML IJ SUSP
40.0000 mg | Freq: Once | INTRAMUSCULAR | Status: AC
  Administered 2022-04-29: 40 mg via INTRAMUSCULAR

## 2022-04-29 MED ORDER — RIZATRIPTAN BENZOATE 10 MG PO TABS: 10.0000 mg | ORAL_TABLET | ORAL | 5 refills | Status: DC | PRN

## 2022-04-29 MED ORDER — KETOROLAC TROMETHAMINE 60 MG/2ML IM SOLN
60.0000 mg | Freq: Once | INTRAMUSCULAR | Status: AC
  Administered 2022-04-29: 60 mg via INTRAMUSCULAR

## 2022-04-29 MED ORDER — ONDANSETRON HCL 4 MG PO TABS
4.0000 mg | ORAL_TABLET | Freq: Once | ORAL | Status: AC
  Administered 2022-04-29: 4 mg via ORAL

## 2022-04-29 NOTE — Progress Notes (Signed)
Patient came into clinic with mother complained of polyuria , polydipsia, headache dizziness, headache rated at 7 on 0-10 scale generalized weakness and shaking . Mother traded appt for daughter to be seen .

## 2022-04-29 NOTE — Progress Notes (Signed)
Chief Complaint  Patient presents with   Polydipsia   Polyuria   Headache   Diabetes   Dizziness   Nausea    X 2 days   Pt brought mother in for appt but she needed to be seen for  Increased thirst, urinating a lot feeling lightheaded and dizzy and h/a h/o migraines seen neurology 03/2022 only on topamax 100 mg qd. She had nasuea sx's since Sunday h/a 7/10 she smelled death and bothered by light. She just saw eye MD last week and needs a new Rx for glasses but does not have them yet  H/a is frontal 7/10 tried Tylenol, motrin w/o relief, prev she took abortive therapies at a younger age which helped  Cbg today 116 and A1c 6.0  Review of Systems  Constitutional:  Negative for weight loss.  HENT:  Negative for hearing loss.   Eyes:  Negative for blurred vision.  Respiratory:  Negative for shortness of breath.   Cardiovascular:  Negative for chest pain.  Gastrointestinal:  Negative for abdominal pain and blood in stool.  Genitourinary:  Negative for dysuria.  Musculoskeletal:  Negative for falls and joint pain.  Skin:  Negative for rash.  Neurological:  Positive for dizziness and headaches.  Psychiatric/Behavioral:  Negative for depression.   Past Medical History:  Diagnosis Date   ADHD    Anemia    ANXIETY 06/06/2007   Arthritis    related to lupus   Asthma    BACK PAIN 02/22/2009   Cervical cancer ([place])    LEEP 2005    Chicken pox    Chronic pain    goes to pain management provider   Collagen vascular disease ([place])    Connective tissue disease ([place])    DEPRESSION 06/06/2007   Essential tremor    Fibromyalgia    Fibromyalgia    FREQUENCY, URINARY 07/07/2007   Gastritis    GERD 06/06/2007   Glaucoma    Headache    h/o migraines    History of hiatal hernia    History of kidney stones    HYPOTHYROIDISM 06/06/2007   INTERSTITIAL CYSTITIS 09/11/2010   Lupus ([place]) 2006   Lupus ([place])    Menopause    Migraine    Mitral valve regurgitation    NEPHROLITHIASIS, HX OF 11/05/2010    Neuropathy    Neuropathy    OCD (obsessive compulsive disorder)    Optic neuritis    2005   OSTEOPENIA 10/14/2009   Osteoporosis    OVERACTIVE BLADDER 10/14/2009   PAIN, CHRONIC NEC 06/06/2007   Panniculitis    Parkinson disease ([place]) 04/03/2019   POLYARTHRITIS 08/19/2007   Raynaud's disease    Sicca syndrome ([place])    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 Marcelline Mates [facility]    Vasculitis [facility])    WEIGHT GAIN 02/22/2009   Past Surgical History:  Procedure Laterality Date   APPENDECTOMY     CERVICAL BIOPSY  W/ LOOP ELECTRODE EXCISION  2005   CHOLECYSTECTOMY     ESOPHAGOGASTRODUODENOSCOPY (EGD) WITH PROPOFOL N/A 08/27/2017   Procedure: ESOPHAGOGASTRODUODENOSCOPY (EGD) WITH PROPOFOL;  Surgeon: Manya Silvas, MD;  Location: [facility] ENDOSCOPY;  Service: Endoscopy;  Laterality: N/A;   HAND SURGERY     repair of left metatarsal    HARDWARE REMOVAL Right 02/16/2017   Procedure: HARDWARE REMOVAL FROM HIP;  Surgeon: Hessie Knows, MD;  Location: [facility] ORS;  Service: Orthopedics;  Laterality: Right;   JOINT REPLACEMENT  OTHER SURGICAL HISTORY     left 3rd metatarsal fracture repair 2011    REVISION TOTAL HIP ARTHROPLASTY  06/2009   replacement 2010 then screw and plate removal 4010; right hip   TONSILLECTOMY     TONSILLECTOMY     wrist  ligament repair bilateral     WRIST RECONSTRUCTION     WRIST SURGERY     laceration of right wrist ligaments   wrist surgery     laceration of left wrist surgery    Family History  Problem Relation Age of Onset   Hypertension Mother    Arthritis Mother    Heart disease Mother        afib   Asthma Sister    Asthma Daughter    Arthritis Daughter    Heart disease Daughter        ?heart condition on BB   ADD / ADHD Daughter    Pancreatic cancer Father    Cancer Father        pancreatitic    Diabetes Maternal Grandmother    Heart disease Maternal Grandmother    Arthritis Maternal Grandmother    Hypertension Maternal  Grandmother    Dementia Maternal Grandmother    Colon cancer Maternal Uncle 54   Crohn's disease Maternal Aunt    Breast cancer Maternal Aunt 65   Diabetes Maternal Aunt    Breast cancer Cousin 39       maternal   Cancer Cousin        m cousin breat cancer s/p removal both breasts    Social History   Socioeconomic History   Marital status: Single    Spouse name: Not on file   Number of children: 1   Years of education: Not on file   Highest education level: Not on file  Occupational History   Occupation: DISABLED    Employer: UNEMPLOYED  Tobacco Use   Smoking status: Never   Smokeless tobacco: Never  Vaping Use   Vaping Use: Never used  Substance and Sexual Activity   Alcohol use: No   Drug use: No   Sexual activity: Not Currently    Birth control/protection: None, Post-menopausal  Other Topics Concern   Not on file  Social History Narrative   She was previously a pediatrician, 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    She lives with daughter and mother      Social Determinants of Radio broadcast assistant Strain: Low Risk    Difficulty of Paying Living Expenses: Not hard at all  Food Insecurity: No Food Insecurity   Worried About Charity fundraiser in the Last Year: Never true   Arboriculturist in the Last Year: Never true  Transportation Needs: No Transportation Needs   Lack of Transportation (Medical): No   Lack of Transportation (Non-Medical): No  Physical Activity: Insufficiently Active   Days of Exercise per Week: 7 days   Minutes of Exercise per Session: 20 min  Stress: No Stress Concern Present   Feeling of Stress : Not at all  Social Connections: Unknown   Frequency of Communication with Friends and Family: More than three times a week   Frequency of Social Gatherings with Friends and Family: More than three times a week   Attends Religious Services: Not on file   Active Member of Clubs or Organizations: Not on file   Attends Theatre manager Meetings: Not on file   Marital Status: Not on  file  Intimate Partner Violence: Not At Risk   Fear of Current or Ex-Partner: No   Emotionally Abused: No   Physically Abused: No   Sexually Abused: No   Current Meds  Medication Sig   acetaminophen (TYLENOL) 650 MG CR tablet Take 650 mg by mouth every 8 (eight) hours as needed for pain.   albuterol (PROVENTIL) (2.5 MG/3ML) 0.083% nebulizer solution Take 3 mLs (2.5 mg total) by nebulization every 6 (six) hours as needed for wheezing or shortness of breath.   albuterol (VENTOLIN HFA) 108 (90 Base) MCG/ACT inhaler Inhale 1-2 puffs into the lungs every 4 (four) hours as needed for wheezing or shortness of breath.   ARIPiprazole (ABILIFY) 20 MG tablet Take 20 mg by mouth daily.   Ascorbic Acid (VITAMIN C) 500 MG CHEW Chew 2,500 mg by mouth daily.   aspirin 325 MG tablet Take 1 tablet (325 mg total) by mouth daily.   Biotin 10 MG CAPS Take by mouth.   Biotin 5000 MCG SUBL Place 10,000 mcg under the tongue daily.   carbidopa-levodopa (SINEMET CR) 50-200 MG tablet Take 1 tablet by mouth 5 (five) times daily.   Cholecalciferol (VITAMIN D3) 2000 units capsule Vitamin D3 2,000 unit capsule   dapsone 25 MG tablet Take by mouth daily.    diclofenac sodium (VOLTAREN) 1 % GEL Apply 2 g topically 4 (four) times daily as needed (for knee & finger pain.).    dicyclomine (BENTYL) 10 MG capsule dicyclomine 10 mg capsule  every 8hours as needed   DULoxetine (CYMBALTA) 60 MG capsule Take 60 mg by mouth 2 (two) times daily.   EPINEPHrine 0.3 mg/0.3 mL IJ SOAJ injection Inject 0.3 mg into the muscle as needed for anaphylaxis.   fentaNYL (DURAGESIC - DOSED MCG/HR) 75 MCG/HR Place 75 mcg onto the skin every 3 (three) days.   Ferrous Sulfate (IRON PO) Take by mouth.   fluticasone-salmeterol (ADVAIR DISKUS) 250-50 MCG/ACT AEPB Inhale 1 puff into the lungs in the morning and at bedtime. Rinse mouth did not tolerate wixela brand only   folic acid (FOLVITE)  1 MG tablet Take 1 tablet (1 mg total) by mouth daily.   hydrocortisone 2.5 % cream Apply topically 2 (two) times daily. Prn under arms   hydroxychloroquine (PLAQUENIL) 200 MG tablet Plaquenil 200 mg tablet  Take 1 tablet twice a day by oral route after meals for 90 days.   ibuprofen (ADVIL,MOTRIN) 800 MG tablet Take 1 tablet (800 mg total) by mouth every 8 (eight) hours as needed.   levothyroxine (SYNTHROID) 75 MCG tablet TAKE 1 TABLET BY MOUTH DAILY 30 MINUTES BEFORE BREAKFAST   LORazepam (ATIVAN) 2 MG tablet Take 1-2 mg by mouth See admin instructions. 1 MG DAILY AS NEEDED FOR ANXIETY & SCHEDULED AT BEDTIME EACH NIGHT   methylphenidate 18 MG PO CR tablet Take by mouth.    methylphenidate 18 MG PO CR tablet Take 18 mg by mouth daily.   montelukast (SINGULAIR) 10 MG tablet montelukast 10 mg tablet 1 pill qhs   Multiple Vitamins-Minerals (MULTI-VITAMIN GUMMIES PO) Multi Vitamin  GUMMIES   omeprazole (PRILOSEC) 40 MG capsule Take 40 mg by mouth 2 (two) times daily.   ondansetron (ZOFRAN) 4 MG tablet Take 1 tablet (4 mg total) by mouth every 8 (eight) hours as needed for nausea or vomiting.   Polyvinyl Alcohol-Povidone (REFRESH OP) Place 1 drop into both eyes 2 (two) times daily.   Probiotic Product (PROBIOTIC PO) Probiotic 10 billion cell capsule  daily  Sodium Chloride 3 % AERS Saline Nasal Mist 3 %  spray nose deeply every hour or two and blow nose   tiZANidine (ZANAFLEX) 4 MG capsule Take by mouth.   topiramate (TOPAMAX) 100 MG tablet 100-150 mg. 100 in am and 150 qhs   Allergies  Allergen Reactions   Bee Venom Anaphylaxis, Itching and Swelling    Affected area Affected area Affected area Affected area    Fish Allergy Hives, Swelling and Rash    Facial swelling  Facial swelling  Facial swelling Facial swelling  Facial swelling    Latex Anaphylaxis, Rash and Shortness Of Breath    Rash  Rash     Prasterone Other (See Comments) and Nausea And Vomiting    rash Other  reaction(s): Headache Headaches.   Shellfish Allergy Hives, Other (See Comments), Rash and Swelling    Facial swelling Uncoded Allergy. Allergen: seafood Uncoded Allergy. Allergen: CATS, Other Reaction: itch, wheezing Uncoded Allergy. Allergen: COMPAZINE, Other Reaction: tremors Facial swelling Facial swelling Uncoded Allergy. Allergen: seafood Uncoded Allergy. Allergen: CATS, Other Reaction: itch, wheezing Uncoded Allergy. Allergen: COMPAZINE, Other Reaction: tremors Facial swelling Uncoded Allergy. Allergen: seafood Uncoded Allergy. Allergen: CATS, Other Reaction: itch, wheezing Uncoded Allergy. Allergen: COMPAZINE, Other Reaction: tremors Facial swelling Facial swelling Uncoded Allergy. Allergen: seafood Uncoded Allergy. Allergen: CATS, Other Reaction: itch, wheezing Uncoded Allergy. Allergen: COMPAZINE, Other Reaction: tremors Facial swelling Facial swelling Uncoded Allergy. Allergen: seafood Uncoded Allergy. Allergen: CATS, Other Reaction: itch, wheezing Uncoded Allergy. Allergen: COMPAZINE, Other Reaction: tremors    Shellfish-Derived Products Hives, Other (See Comments), Rash and Swelling    Facial swelling Uncoded Allergy. Allergen: seafood Uncoded Allergy. Allergen: CATS, Other Reaction: itch, wheezing Uncoded Allergy. Allergen: COMPAZINE, Other Reaction: tremors Facial swelling Facial swelling Uncoded Allergy. Allergen: seafood Uncoded Allergy. Allergen: CATS, Other Reaction: itch, wheezing Uncoded Allergy. Allergen: COMPAZINE, Other Reaction: tremors Facial swelling Uncoded Allergy. Allergen: seafood Uncoded Allergy. Allergen: CATS, Other Reaction: itch, wheezing Uncoded Allergy. Allergen: COMPAZINE, Other Reaction: tremors Facial swelling   Compazine [Prochlorperazine Edisylate] Other (See Comments)    tremors   Dhea [Nutritional Supplements] Other (See Comments)    Headaches.   Dilaudid [Hydromorphone Hcl]     ? reaction   Famotidine Other (See Comments)  and Diarrhea    Other reaction(s): Headache Gas   Lactose    Lactose Intolerance (Gi)    Other Other (See Comments)    Other reaction(s): Unknown    Prochlorperazine     Other reaction(s): Other (See Comments) ticks   Promethazine     Other reaction(s): Other (See Comments) Ticks   Promethazine Hcl Other (See Comments)    CNS disorder   Reclast [Zoledronic Acid]     Weakness could not move limbs, fatigue, increase sleep   Clarithromycin Hives and Rash   Clotrimazole Swelling and Rash   Hydromorphone Hcl Rash and Hives    "Rash all over"   Penicillins Rash and Other (See Comments)    Has patient had a PCN reaction causing immediate rash, facial/tongue/throat swelling, SOB or lightheadedness with hypotension:No Has patient had a PCN reaction causing severe rash involving mucus membranes or skin necrosis:No Has patient had a PCN reaction that required hospitalization:No Has patient had a PCN reaction occurring within the last 10 years:No If all of the above answers are "NO", then may proceed with Cephalosporin use.   Recent Results (from the past 2160 hour(s))  POCT Glucose (CBG)     Status: Abnormal   Collection Time: 04/29/22  2:35 PM  Result  Value Ref Range   POC Glucose 116 (A) 70 - 99 mg/dl  POCT HgB A1C     Status: Abnormal   Collection Time: 04/29/22  2:36 PM  Result Value Ref Range   Hemoglobin A1C 6.0 (A) 4.0 - 5.6 %   HbA1c POC (<> result, manual entry)     HbA1c, POC (prediabetic range)     HbA1c, POC (controlled diabetic range)     Objective  There is no height or weight on file to calculate BMI. Wt Readings from Last 3 Encounters:  01/21/22 214 lb (97.1 kg)  11/27/21 211 lb (95.7 kg)  11/06/21 211 lb (95.7 kg)   Temp Readings from Last 3 Encounters:  04/29/22 98 F (36.7 C) (Oral)  01/21/22 98.2 F (36.8 C) (Oral)  11/06/21 (!) 97.2 F (36.2 C) (Temporal)   BP Readings from Last 3 Encounters:  04/29/22 128/78  01/21/22 132/68  11/06/21 124/84    Pulse Readings from Last 3 Encounters:  04/29/22 (!) 102  01/21/22 71  11/06/21 90    Physical Exam Vitals and nursing note reviewed.  Constitutional:      Appearance: Normal appearance. She is well-developed and well-groomed.  HENT:     Head: Normocephalic and atraumatic.  Eyes:     Conjunctiva/sclera: Conjunctivae normal.     Pupils: Pupils are equal, round, and reactive to light.  Cardiovascular:     Rate and Rhythm: Normal rate and regular rhythm.     Heart sounds: Normal heart sounds. No murmur heard. Pulmonary:     Effort: Pulmonary effort is normal.     Breath sounds: Normal breath sounds.  Abdominal:     General: Abdomen is flat. Bowel sounds are normal.     Tenderness: There is no abdominal tenderness.  Musculoskeletal:        General: No tenderness.  Skin:    General: Skin is warm and dry.  Neurological:     General: No focal deficit present.     Mental Status: She is alert and oriented to person, place, and time. Mental status is at baseline.     Cranial Nerves: Cranial nerves 2-12 are intact.     Motor: Tremor present.     Coordination: Coordination is intact.     Gait: Gait is intact.  Psychiatric:        Attention and Perception: Attention and perception normal.        Mood and Affect: Mood and affect normal.        Speech: Speech normal.        Behavior: Behavior normal. Behavior is cooperative.        Thought Content: Thought content normal.        Cognition and Memory: Cognition and memory normal.        Judgment: Judgment normal.    Assessment  Plan  Intractable migraine with aura with status migrainosus - Plan: ketorolac (TORADOL) injection 60 mg, methylPREDNISolone acetate (DEPO-MEDROL) injection 40 mg, ondansetron (ZOFRAN) tablet 4 mg H/a from 7/10 to 2/10 today  Rx maxalt 10 max dose 20 mg/24 hrs  Polydipsia - Plan: POCT HgB A1C 6.0 likely due to polypharmacy causing dry mouth, POCT Glucose (CBG)  Polyuria r/o UTI- Plan: POCT HgB A1C,  POCT Glucose (CBG)  Exposure to COVID-19 virus - Plan: Novel Coronavirus, NAA ([facility])  HM Had flu shot utd Had pna 23 today  prevnar 09/03/2014 Tdap 10/19/16  covid vx had 4/4 pfizer per pt had 5 shots HT [facility] [place]  [place]    Think about shingrix vaccine in future prev discussed rx today   Immune MMR and hep B   Colonoscopy 12/17/14 h/o polyps Dr. Rayann Heman IH/EH polyp hyperplastic consider repeat in 5-10 years FH m uncle colon cancer  EGD 08/27/2017 negative bxs +hiatal hernia  [facility]    DEXA 06/29/17 osteopenia h/o osteoporosis f/u [facility] in [place] -f/u endocrine [facility] Dr. Prudencio Burly [facility] and he was to try Reclast again though listed on her allergy list he does not think true allergy  Ordered    Mammogram 11/15/20 negative ordered pt needs to call and schedule   Pap 07/01/18 Dr. Marcelline Mates neg pap neg HPV  Consider repeat pap 3-5 years  Pt to call to schedule   See CT chest     IMPRESSION: 1. No CT evidence for acute pulmonary embolus. 2. Symmetric dependent ground-glass attenuation in the lungs, similar to prior study. Imaging features likely related to hypo expansion/ compressive atelectasis.   Dermatology Barnetta Chapel saw early 10/2019   rec vitamin D3 4000 Iu daily vitamin D 06/08/19 was 24    Vitamin C75 198 2/42/99  Folic acid >80 6/99/96    Eye exam had 10/2019 will return 01/28/20    Of note [facility] Dr. Nancy Fetter f/u sch in 01/2020, eye exam 01/28/20 to f/u HCQ monitoring  rec healthy diet and exercise      [facility] neuro Dr. Angela Nevin rheum Dr. Evette Cristal [facility] rheumatology [facility] as of 03/20/21 wants new referral [facility] Dr. Alice Reichert, Effie Berkshire  Cards Dr. Josefa Half Ortho Dr. Mendel Ryder endocrine Dr. Prudencio Burly >Dr. Jamelle Haring [facility] Dr. Domenica Fail     Provider: Dr. Olivia Mackie McLean-Scocuzza-Internal Medicine

## 2022-04-29 NOTE — Progress Notes (Unsigned)
No chief complaint on file.  HPI ROS Past Medical History:  Diagnosis Date   ADHD    Anemia    ANXIETY 06/06/2007   Arthritis    related to lupus   Asthma    BACK PAIN 02/22/2009   Cervical cancer ([place])    LEEP 2005    Chicken pox    Chronic pain    goes to pain management provider   Collagen vascular disease ([place])    Connective tissue disease ([place])    DEPRESSION 06/06/2007   Essential tremor    Fibromyalgia    Fibromyalgia    FREQUENCY, URINARY 07/07/2007   Gastritis    GERD 06/06/2007   Glaucoma    Headache    h/o migraines    History of hiatal hernia    History of kidney stones    HYPOTHYROIDISM 06/06/2007   INTERSTITIAL CYSTITIS 09/11/2010   Lupus ([place]) 2006   Lupus ([place])    Menopause    Migraine    Mitral valve regurgitation    NEPHROLITHIASIS, HX OF 11/05/2010   Neuropathy    Neuropathy    OCD (obsessive compulsive disorder)    Optic neuritis    2005   OSTEOPENIA 10/14/2009   Osteoporosis    OVERACTIVE BLADDER 10/14/2009   PAIN, CHRONIC NEC 06/06/2007   Panniculitis    Parkinson disease ([place]) 04/03/2019   POLYARTHRITIS 08/19/2007   Raynaud's disease    Sicca syndrome ([place])    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 Marcelline Mates [facility]    Vasculitis [facility])    WEIGHT GAIN 02/22/2009   Past Surgical History:  Procedure Laterality Date   APPENDECTOMY     CERVICAL BIOPSY  W/ LOOP ELECTRODE EXCISION  2005   CHOLECYSTECTOMY     ESOPHAGOGASTRODUODENOSCOPY (EGD) WITH PROPOFOL N/A 08/27/2017   Procedure: ESOPHAGOGASTRODUODENOSCOPY (EGD) WITH PROPOFOL;  Surgeon: Manya Silvas, MD;  Location: [facility] ENDOSCOPY;  Service: Endoscopy;  Laterality: N/A;   HAND SURGERY     repair of left metatarsal    HARDWARE REMOVAL Right 02/16/2017   Procedure: HARDWARE REMOVAL FROM HIP;  Surgeon: Hessie Knows, MD;  Location: [facility] ORS;  Service: Orthopedics;  Laterality: Right;   JOINT REPLACEMENT     OTHER SURGICAL HISTORY     left 3rd metatarsal fracture repair 2011     REVISION TOTAL HIP ARTHROPLASTY  06/2009   replacement 2010 then screw and plate removal 7341; right hip   TONSILLECTOMY     TONSILLECTOMY     wrist  ligament repair bilateral     WRIST RECONSTRUCTION     WRIST SURGERY     laceration of right wrist ligaments   wrist surgery     laceration of left wrist surgery    Family History  Problem Relation Age of Onset   Hypertension Mother    Arthritis Mother    Heart disease Mother        afib   Asthma Sister    Asthma Daughter    Arthritis Daughter    Heart disease Daughter        ?heart condition on BB   ADD / ADHD Daughter    Pancreatic cancer Father    Cancer Father        pancreatitic    Diabetes Maternal Grandmother    Heart disease Maternal Grandmother    Arthritis Maternal Grandmother    Hypertension Maternal Grandmother    Dementia Maternal Grandmother    Colon cancer Maternal Uncle  86   Crohn's disease Maternal Aunt    Breast cancer Maternal Aunt 65   Diabetes Maternal Aunt    Breast cancer Cousin 21       maternal   Cancer Cousin        m cousin breat cancer s/p removal both breasts    Social History   Socioeconomic History   Marital status: Single    Spouse name: Not on file   Number of children: 1   Years of education: Not on file   Highest education level: Not on file  Occupational History   Occupation: DISABLED    Employer: UNEMPLOYED  Tobacco Use   Smoking status: Never   Smokeless tobacco: Never  Vaping Use   Vaping Use: Never used  Substance and Sexual Activity   Alcohol use: No   Drug use: No   Sexual activity: Not Currently    Birth control/protection: None, Post-menopausal  Other Topics Concern   Not on file  Social History Narrative   She was previously a pediatrician, 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    She lives with daughter and mother      Social Determinants of Health   Financial Resource Strain: Low Risk    Difficulty of Paying Living Expenses: Not hard at  all  Food Insecurity: No Food Insecurity   Worried About Charity fundraiser in the Last Year: Never true   Arboriculturist in the Last Year: Never true  Transportation Needs: No Transportation Needs   Lack of Transportation (Medical): No   Lack of Transportation (Non-Medical): No  Physical Activity: Insufficiently Active   Days of Exercise per Week: 7 days   Minutes of Exercise per Session: 20 min  Stress: No Stress Concern Present   Feeling of Stress : Not at all  Social Connections: Unknown   Frequency of Communication with Friends and Family: More than three times a week   Frequency of Social Gatherings with Friends and Family: More than three times a week   Attends Religious Services: Not on Electrical engineer or Organizations: Not on file   Attends Archivist Meetings: Not on file   Marital Status: Not on file  Intimate Partner Violence: Not At Risk   Fear of Current or Ex-Partner: No   Emotionally Abused: No   Physically Abused: No   Sexually Abused: No   No outpatient medications have been marked as taking for the 04/29/22 encounter (Orders Only) with McLean-Scocuzza, Nino Glow, MD.   Allergies  Allergen Reactions   Bee Venom Anaphylaxis, Itching and Swelling    Affected area Affected area Affected area Affected area    Fish Allergy Hives, Swelling and Rash    Facial swelling  Facial swelling  Facial swelling Facial swelling  Facial swelling    Latex Anaphylaxis, Rash and Shortness Of Breath    Rash  Rash     Prasterone Other (See Comments) and Nausea And Vomiting    rash Other reaction(s): Headache Headaches.   Shellfish Allergy Hives, Other (See Comments), Rash and Swelling    Facial swelling Uncoded Allergy. Allergen: seafood Uncoded Allergy. Allergen: CATS, Other Reaction: itch, wheezing Uncoded Allergy. Allergen: COMPAZINE, Other Reaction: tremors Facial swelling Facial swelling Uncoded Allergy. Allergen: seafood Uncoded  Allergy. Allergen: CATS, Other Reaction: itch, wheezing Uncoded Allergy. Allergen: COMPAZINE, Other Reaction: tremors Facial swelling Uncoded Allergy. Allergen: seafood Uncoded Allergy. Allergen: CATS, Other Reaction: itch, wheezing Uncoded Allergy. Allergen:  COMPAZINE, Other Reaction: tremors Facial swelling Facial swelling Uncoded Allergy. Allergen: seafood Uncoded Allergy. Allergen: CATS, Other Reaction: itch, wheezing Uncoded Allergy. Allergen: COMPAZINE, Other Reaction: tremors Facial swelling Facial swelling Uncoded Allergy. Allergen: seafood Uncoded Allergy. Allergen: CATS, Other Reaction: itch, wheezing Uncoded Allergy. Allergen: COMPAZINE, Other Reaction: tremors    Shellfish-Derived Products Hives, Other (See Comments), Rash and Swelling    Facial swelling Uncoded Allergy. Allergen: seafood Uncoded Allergy. Allergen: CATS, Other Reaction: itch, wheezing Uncoded Allergy. Allergen: COMPAZINE, Other Reaction: tremors Facial swelling Facial swelling Uncoded Allergy. Allergen: seafood Uncoded Allergy. Allergen: CATS, Other Reaction: itch, wheezing Uncoded Allergy. Allergen: COMPAZINE, Other Reaction: tremors Facial swelling Uncoded Allergy. Allergen: seafood Uncoded Allergy. Allergen: CATS, Other Reaction: itch, wheezing Uncoded Allergy. Allergen: COMPAZINE, Other Reaction: tremors Facial swelling   Compazine [Prochlorperazine Edisylate] Other (See Comments)    tremors   Dhea [Nutritional Supplements] Other (See Comments)    Headaches.   Dilaudid [Hydromorphone Hcl]     ? reaction   Famotidine Other (See Comments) and Diarrhea    Other reaction(s): Headache Gas   Lactose    Lactose Intolerance (Gi)    Other Other (See Comments)    Other reaction(s): Unknown    Prochlorperazine     Other reaction(s): Other (See Comments) ticks   Promethazine     Other reaction(s): Other (See Comments) Ticks   Promethazine Hcl Other (See Comments)    CNS disorder   Reclast  [Zoledronic Acid]     Weakness could not move limbs, fatigue, increase sleep   Clarithromycin Hives and Rash   Clotrimazole Swelling and Rash   Hydromorphone Hcl Rash and Hives    "Rash all over"   Penicillins Rash and Other (See Comments)    Has patient had a PCN reaction causing immediate rash, facial/tongue/throat swelling, SOB or lightheadedness with hypotension:No Has patient had a PCN reaction causing severe rash involving mucus membranes or skin necrosis:No Has patient had a PCN reaction that required hospitalization:No Has patient had a PCN reaction occurring within the last 10 years:No If all of the above answers are "NO", then may proceed with Cephalosporin use.   No results found for this or any previous visit (from the past 2160 hour(s)). Objective  There is no height or weight on file to calculate BMI. Wt Readings from Last 3 Encounters:  01/21/22 214 lb (97.1 kg)  11/27/21 211 lb (95.7 kg)  11/06/21 211 lb (95.7 kg)   Temp Readings from Last 3 Encounters:  01/21/22 98.2 F (36.8 C) (Oral)  11/06/21 (!) 97.2 F (36.2 C) (Temporal)  08/26/21 98.6 F (37 C) (Oral)   BP Readings from Last 3 Encounters:  01/21/22 132/68  11/06/21 124/84  08/26/21 118/78   Pulse Readings from Last 3 Encounters:  01/21/22 71  11/06/21 90  08/26/21 90    Physical Exam  Assessment  Plan  No diagnosis found.  Provider: Dr. Olivia Mackie McLean-Scocuzza-Internal Medicine

## 2022-04-30 ENCOUNTER — Ambulatory Visit: Payer: Medicare Other | Admitting: Internal Medicine

## 2022-04-30 LAB — URINALYSIS, ROUTINE W REFLEX MICROSCOPIC
Bilirubin Urine: NEGATIVE
Glucose, UA: NEGATIVE
Hgb urine dipstick: NEGATIVE
Ketones, ur: NEGATIVE
Leukocytes,Ua: NEGATIVE
Nitrite: NEGATIVE
Protein, ur: NEGATIVE
Specific Gravity, Urine: 1.007 (ref 1.001–1.035)
pH: 6.5 (ref 5.0–8.0)

## 2022-04-30 LAB — URINE CULTURE
MICRO NUMBER:: 13464768
Result:: NO GROWTH
SPECIMEN QUALITY:: ADEQUATE

## 2022-04-30 LAB — NOVEL CORONAVIRUS, NAA: SARS-CoV-2, NAA: NOT DETECTED

## 2022-05-01 ENCOUNTER — Ambulatory Visit: Payer: Medicare Other | Admitting: Internal Medicine

## 2022-05-04 DIAGNOSIS — M25562 Pain in left knee: Secondary | ICD-10-CM | POA: Diagnosis not present

## 2022-05-04 DIAGNOSIS — M25551 Pain in right hip: Secondary | ICD-10-CM | POA: Diagnosis not present

## 2022-05-04 DIAGNOSIS — M25531 Pain in right wrist: Secondary | ICD-10-CM | POA: Diagnosis not present

## 2022-05-04 DIAGNOSIS — H5710 Ocular pain, unspecified eye: Secondary | ICD-10-CM | POA: Diagnosis not present

## 2022-05-04 DIAGNOSIS — L932 Other local lupus erythematosus: Secondary | ICD-10-CM | POA: Diagnosis not present

## 2022-05-04 DIAGNOSIS — Z881 Allergy status to other antibiotic agents status: Secondary | ICD-10-CM | POA: Diagnosis not present

## 2022-05-04 DIAGNOSIS — Z79899 Other long term (current) drug therapy: Secondary | ICD-10-CM | POA: Diagnosis not present

## 2022-05-04 DIAGNOSIS — K0889 Other specified disorders of teeth and supporting structures: Secondary | ICD-10-CM | POA: Diagnosis not present

## 2022-05-04 DIAGNOSIS — H538 Other visual disturbances: Secondary | ICD-10-CM | POA: Diagnosis not present

## 2022-05-04 DIAGNOSIS — G20 Parkinson's disease: Secondary | ICD-10-CM | POA: Diagnosis not present

## 2022-05-04 DIAGNOSIS — K219 Gastro-esophageal reflux disease without esophagitis: Secondary | ICD-10-CM | POA: Diagnosis not present

## 2022-05-04 DIAGNOSIS — M545 Low back pain, unspecified: Secondary | ICD-10-CM | POA: Diagnosis not present

## 2022-05-04 DIAGNOSIS — Z880 Allergy status to penicillin: Secondary | ICD-10-CM | POA: Diagnosis not present

## 2022-05-04 DIAGNOSIS — G894 Chronic pain syndrome: Secondary | ICD-10-CM | POA: Diagnosis not present

## 2022-05-04 DIAGNOSIS — Z885 Allergy status to narcotic agent status: Secondary | ICD-10-CM | POA: Diagnosis not present

## 2022-05-04 DIAGNOSIS — E039 Hypothyroidism, unspecified: Secondary | ICD-10-CM | POA: Diagnosis not present

## 2022-05-04 DIAGNOSIS — Z888 Allergy status to other drugs, medicaments and biological substances status: Secondary | ICD-10-CM | POA: Diagnosis not present

## 2022-05-04 DIAGNOSIS — H9203 Otalgia, bilateral: Secondary | ICD-10-CM | POA: Diagnosis not present

## 2022-05-04 DIAGNOSIS — H5713 Ocular pain, bilateral: Secondary | ICD-10-CM | POA: Diagnosis not present

## 2022-05-05 DIAGNOSIS — H5710 Ocular pain, unspecified eye: Secondary | ICD-10-CM | POA: Diagnosis not present

## 2022-05-11 DIAGNOSIS — Z79899 Other long term (current) drug therapy: Secondary | ICD-10-CM | POA: Diagnosis not present

## 2022-05-11 DIAGNOSIS — F3181 Bipolar II disorder: Secondary | ICD-10-CM | POA: Diagnosis not present

## 2022-05-11 DIAGNOSIS — G479 Sleep disorder, unspecified: Secondary | ICD-10-CM | POA: Diagnosis not present

## 2022-05-11 DIAGNOSIS — F419 Anxiety disorder, unspecified: Secondary | ICD-10-CM | POA: Diagnosis not present

## 2022-05-20 ENCOUNTER — Telehealth: Payer: Self-pay

## 2022-05-20 NOTE — Telephone Encounter (Signed)
Patient dropped off jury duty form for Dr. Olivia Mackie McLean-Scocuzza to fill out.  Form is in color folder up front.

## 2022-05-28 DIAGNOSIS — F3181 Bipolar II disorder: Secondary | ICD-10-CM | POA: Diagnosis not present

## 2022-06-01 ENCOUNTER — Ambulatory Visit (INDEPENDENT_AMBULATORY_CARE_PROVIDER_SITE_OTHER): Payer: Medicare Other | Admitting: Family Medicine

## 2022-06-01 ENCOUNTER — Encounter: Payer: Self-pay | Admitting: Family Medicine

## 2022-06-01 VITALS — BP 110/72 | HR 98 | Temp 98.4°F | Ht 65.0 in | Wt 220.1 lb

## 2022-06-01 DIAGNOSIS — H9201 Otalgia, right ear: Secondary | ICD-10-CM | POA: Diagnosis not present

## 2022-06-01 MED ORDER — NEOMYCIN-POLYMYXIN-HC 3.5-10000-1 OT SOLN: 3.0000 [drp] | Freq: Four times a day (QID) | OTIC | 0 refills | Status: DC

## 2022-06-01 NOTE — Patient Instructions (Signed)
Start antibiotic drops to right ear.  Call if not improving as expected.

## 2022-06-01 NOTE — Progress Notes (Signed)
[name], female    DOB: Feb 04, 1966, 56 y.o.   MRN: [id number]  This visit was conducted in person.  BP 110/72   Pulse 98   Temp 98.4 F (36.9 C) (Oral)   Ht '5\' 5"'$  (1.651 m)   Wt 220 lb 2 oz (99.8 kg)   LMP 12/15/2015 Comment: neg urine test   SpO2 97%   BMI 36.63 kg/m    CC:  Chief Complaint  Patient presents with   Ear Pain    With fullness/concerned she has a tick in right ear    Subjective:   HPI: [name] [name] is a 56 y.o. female presenting on 06/01/2022 for Ear Pain (With fullness/concerned she has a tick in right ear)  She has noted several of fullness in right ear x 1 week. She is concerned there may be an insect in her right ear.  She feels like something moving in ear.   She has been treating with ear drops... nothing came out. Tried to flush ear with bulb syringe.  Feels bump when uses Q-tip. Area is sore.  Mild scratching with swallowing.   Glands on right are swollen.  No fever, no sinus pain, no ST.        Relevant past medical, surgical, family and social history reviewed and updated as indicated. Interim medical history since our last visit reviewed. Allergies and medications reviewed and updated. Outpatient Medications Prior to Visit  Medication Sig Dispense Refill   acetaminophen (TYLENOL) 650 MG CR tablet Take 650 mg by mouth every 8 (eight) hours as needed for pain.     albuterol (PROVENTIL) (2.5 MG/3ML) 0.083% nebulizer solution Take 3 mLs (2.5 mg total) by nebulization every 6 (six) hours as needed for wheezing or shortness of breath. 150 mL 12   albuterol (VENTOLIN HFA) 108 (90 Base) MCG/ACT inhaler Inhale 1-2 puffs into the lungs every 4 (four) hours as needed for wheezing or shortness of breath. 18 g 11   ARIPiprazole (ABILIFY) 20 MG tablet Take 20 mg by mouth daily.     Ascorbic Acid (VITAMIN C) 500 MG CHEW Chew 2,500 mg by mouth daily.     aspirin 325 MG tablet Take 1 tablet (325 mg total) by mouth daily.  30 tablet 0   Biotin 5000 MCG CHEW Chew 2 each by mouth daily.     carbidopa-levodopa (SINEMET CR) 50-200 MG tablet Take 1 tablet by mouth 5 (five) times daily.     Cholecalciferol (VITAMIN D3) 2000 units capsule Vitamin D3 2,000 unit capsule     dapsone 25 MG tablet Take by mouth daily.      diclofenac sodium (VOLTAREN) 1 % GEL Apply 2 g topically 4 (four) times daily as needed (for knee & finger pain.).      dicyclomine (BENTYL) 20 MG tablet Take by mouth.     DULoxetine (CYMBALTA) 60 MG capsule Take 60 mg by mouth 2 (two) times daily.     EPINEPHrine 0.3 mg/0.3 mL IJ SOAJ injection Inject 0.3 mg into the muscle as needed for anaphylaxis.     Ferrous Sulfate (IRON PO) Take by mouth.     fluticasone-salmeterol (ADVAIR DISKUS) 250-50 MCG/ACT AEPB Inhale 1 puff into the lungs in the morning and at bedtime. Rinse mouth did not tolerate wixela brand only 60 each 12   folic acid (FOLVITE) 1 MG tablet Take 1 tablet (1 mg total) by mouth daily. 90 tablet 3   hydrocortisone 2.5 % cream Apply  topically 2 (two) times daily. Prn under arms 30 g 2   hydroxychloroquine (PLAQUENIL) 200 MG tablet Plaquenil 200 mg tablet  Take 1 tablet twice a day by oral route after meals for 90 days.     ibuprofen (ADVIL,MOTRIN) 800 MG tablet Take 1 tablet (800 mg total) by mouth every 8 (eight) hours as needed. 30 tablet 0   levothyroxine (SYNTHROID) 75 MCG tablet TAKE 1 TABLET BY MOUTH DAILY 30 MINUTES BEFORE BREAKFAST 90 tablet 3   LORazepam (ATIVAN) 2 MG tablet Take 1-2 mg by mouth See admin instructions. 1 MG DAILY AS NEEDED FOR ANXIETY & SCHEDULED AT BEDTIME EACH NIGHT     montelukast (SINGULAIR) 10 MG tablet montelukast 10 mg tablet 1 pill qhs 90 tablet 3   morphine (MSIR) 15 MG tablet SMARTSIG:1 Tablet(s) By Mouth Every 12 Hours PRN     Multiple Vitamins-Minerals (MULTI-VITAMIN GUMMIES PO) Multi Vitamin  GUMMIES     ondansetron (ZOFRAN) 4 MG tablet Take 1 tablet (4 mg total) by mouth every 8 (eight) hours as needed  for nausea or vomiting. 40 tablet 2   pantoprazole (PROTONIX) 40 MG tablet Take 40 mg by mouth 2 (two) times daily.     PARoxetine (PAXIL) 10 MG tablet Take 10 mg by mouth at bedtime.     Polyvinyl Alcohol-Povidone (REFRESH OP) Place 1 drop into both eyes 2 (two) times daily.     Probiotic Product (PROBIOTIC PO) Probiotic 10 billion cell capsule  daily     rizatriptan (MAXALT) 10 MG tablet Take 1 tablet (10 mg total) by mouth as needed for migraine. May repeat in 2 hours if needed. Max dose 20 mg in 1 day 10 tablet 5   Sodium Chloride 3 % AERS Saline Nasal Mist 3 %  spray nose deeply every hour or two and blow nose     tiZANidine (ZANAFLEX) 4 MG capsule Take by mouth.     topiramate (TOPAMAX) 50 MG tablet Take 150 mg by mouth in the morning and at bedtime.     Biotin 10 MG CAPS Take by mouth.     Biotin 5000 MCG SUBL Place 10,000 mcg under the tongue daily.     dicyclomine (BENTYL) 10 MG capsule dicyclomine 10 mg capsule  every 8hours as needed     fentaNYL (DURAGESIC - DOSED MCG/HR) 75 MCG/HR Place 75 mcg onto the skin every 3 (three) days.     methylphenidate 18 MG PO CR tablet Take by mouth.      methylphenidate 18 MG PO CR tablet Take 18 mg by mouth daily.     omeprazole (PRILOSEC) 40 MG capsule Take 40 mg by mouth 2 (two) times daily.     topiramate (TOPAMAX) 100 MG tablet 100-150 mg. 100 in am and 150 qhs     No facility-administered medications prior to visit.     Per HPI unless specifically indicated in ROS section below Review of Systems  Constitutional:  Negative for fatigue and fever.  HENT:  Positive for ear pain. Negative for congestion.   Eyes:  Negative for pain.  Respiratory:  Negative for cough and shortness of breath.   Cardiovascular:  Negative for chest pain, palpitations and leg swelling.  Gastrointestinal:  Negative for abdominal pain.  Genitourinary:  Negative for dysuria and vaginal bleeding.  Musculoskeletal:  Negative for back pain.  Neurological:  Negative  for syncope, light-headedness and headaches.  Psychiatric/Behavioral:  Negative for dysphoric mood.    Objective:  BP 110/72   Pulse  98   Temp 98.4 F (36.9 C) (Oral)   Ht '5\' 5"'$  (1.651 m)   Wt 220 lb 2 oz (99.8 kg)   LMP 12/15/2015 Comment: neg urine test   SpO2 97%   BMI 36.63 kg/m   Wt Readings from Last 3 Encounters:  06/01/22 220 lb 2 oz (99.8 kg)  01/21/22 214 lb (97.1 kg)  11/27/21 211 lb (95.7 kg)      Physical Exam Constitutional:      General: She is not in acute distress.    Appearance: Normal appearance. She is well-developed. She is not ill-appearing or toxic-appearing.  HENT:     Head: Normocephalic.     Right Ear: Hearing, tympanic membrane, ear canal and external ear normal. Tympanic membrane is not erythematous, retracted or bulging.     Left Ear: Hearing, tympanic membrane, ear canal and external ear normal. Tympanic membrane is not erythematous, retracted or bulging.     Nose: No mucosal edema or rhinorrhea.     Right Sinus: No maxillary sinus tenderness or frontal sinus tenderness.     Left Sinus: No maxillary sinus tenderness or frontal sinus tenderness.     Mouth/Throat:     Pharynx: Uvula midline.  Eyes:     General: Lids are normal. Lids are everted, no foreign bodies appreciated.     Conjunctiva/sclera: Conjunctivae normal.     Pupils: Pupils are equal, round, and reactive to light.  Neck:     Thyroid: No thyroid mass or thyromegaly.     Vascular: No carotid bruit.     Trachea: Trachea normal.  Cardiovascular:     Rate and Rhythm: Normal rate and regular rhythm.     Pulses: Normal pulses.     Heart sounds: Normal heart sounds, S1 normal and S2 normal. No murmur heard.    No friction rub. No gallop.  Pulmonary:     Effort: Pulmonary effort is normal. No tachypnea or respiratory distress.     Breath sounds: Normal breath sounds. No decreased breath sounds, wheezing, rhonchi or rales.  Abdominal:     General: Bowel sounds are normal.      Palpations: Abdomen is soft.     Tenderness: There is no abdominal tenderness.  Musculoskeletal:     Cervical back: Normal range of motion and neck supple.  Skin:    General: Skin is warm and dry.     Findings: No rash.  Neurological:     Mental Status: She is alert.  Psychiatric:        Mood and Affect: Mood is not anxious or depressed.        Speech: Speech normal.        Behavior: Behavior normal. Behavior is cooperative.        Thought Content: Thought content normal.        Judgment: Judgment normal.       Results for orders placed or performed in visit on 04/29/22  Novel Coronavirus, NAA ([facility])   Specimen: Nasopharyngeal(NP) swabs in vial transport medium   Nasopharynge  Previous  Result Value Ref Range   SARS-CoV-2, NAA Not Detected Not Detected  Urine Culture   Specimen: Urine  Result Value Ref Range   MICRO NUMBER: [id number]    SPECIMEN QUALITY: Adequate    Sample Source NOT GIVEN    STATUS: FINAL    Result: No Growth   Urinalysis, Routine w reflex microscopic  Result Value Ref Range   Color, Urine YELLOW YELLOW   APPearance CLEAR  CLEAR   Specific Gravity, Urine 1.007 1.001 - 1.035   pH 6.5 5.0 - 8.0   Glucose, UA NEGATIVE NEGATIVE   Bilirubin Urine NEGATIVE NEGATIVE   Ketones, ur NEGATIVE NEGATIVE   Hgb urine dipstick NEGATIVE NEGATIVE   Protein, ur NEGATIVE NEGATIVE   Nitrite NEGATIVE NEGATIVE   Leukocytes,Ua NEGATIVE NEGATIVE  POCT HgB A1C  Result Value Ref Range   Hemoglobin A1C 6.0 (A) 4.0 - 5.6 %   HbA1c POC (<> result, manual entry)     HbA1c, POC (prediabetic range)     HbA1c, POC (controlled diabetic range)    POCT Glucose (CBG)  Result Value Ref Range   POC Glucose 116 (A) 70 - 99 mg/dl     COVID 19 screen:  No recent travel or known exposure to COVID19 The patient denies respiratory symptoms of COVID 19 at this time. The importance of social distancing was discussed today.   Assessment and Plan    Problem List Items Addressed This  Visit     Right ear pain - Primary    Acute, no foreign body seen.  Possible resolving external otitis.  Complete treatment with antibiotic drops drops.      Meds ordered this encounter  Medications   neomycin-polymyxin-hydrocortisone (CORTISPORIN) OTIC solution    Sig: Place 3 drops into the right ear 4 (four) times daily.    Dispense:  10 mL    Refill:  0     Eliezer Lofts, MD

## 2022-06-03 ENCOUNTER — Encounter: Payer: Self-pay | Admitting: Internal Medicine

## 2022-06-03 ENCOUNTER — Ambulatory Visit: Payer: Medicare Other | Admitting: Internal Medicine

## 2022-06-05 ENCOUNTER — Ambulatory Visit: Payer: Medicare Other | Admitting: Internal Medicine

## 2022-06-10 DIAGNOSIS — G20 Parkinson's disease: Secondary | ICD-10-CM | POA: Diagnosis not present

## 2022-06-15 DIAGNOSIS — M545 Low back pain, unspecified: Secondary | ICD-10-CM | POA: Diagnosis not present

## 2022-06-15 DIAGNOSIS — M25551 Pain in right hip: Secondary | ICD-10-CM | POA: Diagnosis not present

## 2022-06-15 DIAGNOSIS — M25562 Pain in left knee: Secondary | ICD-10-CM | POA: Diagnosis not present

## 2022-06-15 DIAGNOSIS — M25531 Pain in right wrist: Secondary | ICD-10-CM | POA: Diagnosis not present

## 2022-06-15 DIAGNOSIS — Z79899 Other long term (current) drug therapy: Secondary | ICD-10-CM | POA: Diagnosis not present

## 2022-06-15 DIAGNOSIS — G894 Chronic pain syndrome: Secondary | ICD-10-CM | POA: Diagnosis not present

## 2022-06-17 ENCOUNTER — Other Ambulatory Visit: Payer: Self-pay | Admitting: Internal Medicine

## 2022-06-17 DIAGNOSIS — J309 Allergic rhinitis, unspecified: Secondary | ICD-10-CM

## 2022-06-22 DIAGNOSIS — F419 Anxiety disorder, unspecified: Secondary | ICD-10-CM | POA: Diagnosis not present

## 2022-06-22 DIAGNOSIS — G479 Sleep disorder, unspecified: Secondary | ICD-10-CM | POA: Diagnosis not present

## 2022-06-22 DIAGNOSIS — F3181 Bipolar II disorder: Secondary | ICD-10-CM | POA: Diagnosis not present

## 2022-06-30 ENCOUNTER — Ambulatory Visit: Payer: Medicare Other | Admitting: Internal Medicine

## 2022-06-30 DIAGNOSIS — K9089 Other intestinal malabsorption: Secondary | ICD-10-CM | POA: Insufficient documentation

## 2022-07-04 DIAGNOSIS — R109 Unspecified abdominal pain: Secondary | ICD-10-CM | POA: Diagnosis not present

## 2022-07-04 DIAGNOSIS — H9201 Otalgia, right ear: Secondary | ICD-10-CM | POA: Insufficient documentation

## 2022-07-04 DIAGNOSIS — F419 Anxiety disorder, unspecified: Secondary | ICD-10-CM | POA: Diagnosis not present

## 2022-07-04 DIAGNOSIS — M4056 Lordosis, unspecified, lumbar region: Secondary | ICD-10-CM | POA: Diagnosis not present

## 2022-07-04 DIAGNOSIS — G43909 Migraine, unspecified, not intractable, without status migrainosus: Secondary | ICD-10-CM | POA: Diagnosis not present

## 2022-07-04 DIAGNOSIS — E039 Hypothyroidism, unspecified: Secondary | ICD-10-CM | POA: Diagnosis not present

## 2022-07-04 DIAGNOSIS — M40204 Unspecified kyphosis, thoracic region: Secondary | ICD-10-CM | POA: Diagnosis not present

## 2022-07-04 DIAGNOSIS — M549 Dorsalgia, unspecified: Secondary | ICD-10-CM | POA: Diagnosis not present

## 2022-07-04 DIAGNOSIS — F32A Depression, unspecified: Secondary | ICD-10-CM | POA: Diagnosis not present

## 2022-07-04 DIAGNOSIS — Z91040 Latex allergy status: Secondary | ICD-10-CM | POA: Diagnosis not present

## 2022-07-04 DIAGNOSIS — Z91030 Bee allergy status: Secondary | ICD-10-CM | POA: Diagnosis not present

## 2022-07-04 DIAGNOSIS — Z7901 Long term (current) use of anticoagulants: Secondary | ICD-10-CM | POA: Diagnosis not present

## 2022-07-04 DIAGNOSIS — M329 Systemic lupus erythematosus, unspecified: Secondary | ICD-10-CM | POA: Diagnosis not present

## 2022-07-04 DIAGNOSIS — Z79899 Other long term (current) drug therapy: Secondary | ICD-10-CM | POA: Diagnosis not present

## 2022-07-04 DIAGNOSIS — J45909 Unspecified asthma, uncomplicated: Secondary | ICD-10-CM | POA: Diagnosis not present

## 2022-07-04 DIAGNOSIS — Z9889 Other specified postprocedural states: Secondary | ICD-10-CM | POA: Diagnosis not present

## 2022-07-04 DIAGNOSIS — K219 Gastro-esophageal reflux disease without esophagitis: Secondary | ICD-10-CM | POA: Diagnosis not present

## 2022-07-04 DIAGNOSIS — G20 Parkinson's disease: Secondary | ICD-10-CM | POA: Diagnosis not present

## 2022-07-04 DIAGNOSIS — N281 Cyst of kidney, acquired: Secondary | ICD-10-CM | POA: Diagnosis not present

## 2022-07-04 DIAGNOSIS — K589 Irritable bowel syndrome without diarrhea: Secondary | ICD-10-CM | POA: Diagnosis not present

## 2022-07-04 NOTE — Assessment & Plan Note (Signed)
Acute, no foreign body seen.  Possible resolving external otitis.  Complete treatment with antibiotic drops drops.

## 2022-07-05 DIAGNOSIS — M4724 Other spondylosis with radiculopathy, thoracic region: Secondary | ICD-10-CM | POA: Diagnosis not present

## 2022-07-05 DIAGNOSIS — M5136 Other intervertebral disc degeneration, lumbar region: Secondary | ICD-10-CM | POA: Diagnosis not present

## 2022-07-05 DIAGNOSIS — F0280 Dementia in other diseases classified elsewhere without behavioral disturbance: Secondary | ICD-10-CM | POA: Diagnosis not present

## 2022-07-05 DIAGNOSIS — M5134 Other intervertebral disc degeneration, thoracic region: Secondary | ICD-10-CM | POA: Diagnosis not present

## 2022-07-05 DIAGNOSIS — M549 Dorsalgia, unspecified: Secondary | ICD-10-CM | POA: Diagnosis not present

## 2022-07-05 DIAGNOSIS — M545 Low back pain, unspecified: Secondary | ICD-10-CM | POA: Diagnosis not present

## 2022-07-05 DIAGNOSIS — M40204 Unspecified kyphosis, thoracic region: Secondary | ICD-10-CM | POA: Diagnosis not present

## 2022-07-05 DIAGNOSIS — G20 Parkinson's disease: Secondary | ICD-10-CM | POA: Diagnosis not present

## 2022-07-05 DIAGNOSIS — E039 Hypothyroidism, unspecified: Secondary | ICD-10-CM | POA: Diagnosis not present

## 2022-07-06 DIAGNOSIS — M549 Dorsalgia, unspecified: Secondary | ICD-10-CM | POA: Diagnosis not present

## 2022-07-06 DIAGNOSIS — G20 Parkinson's disease: Secondary | ICD-10-CM | POA: Diagnosis not present

## 2022-07-06 DIAGNOSIS — M329 Systemic lupus erythematosus, unspecified: Secondary | ICD-10-CM | POA: Diagnosis not present

## 2022-07-06 DIAGNOSIS — E039 Hypothyroidism, unspecified: Secondary | ICD-10-CM | POA: Diagnosis not present

## 2022-07-07 DIAGNOSIS — T300 Burn of unspecified body region, unspecified degree: Secondary | ICD-10-CM | POA: Diagnosis not present

## 2022-07-07 DIAGNOSIS — M549 Dorsalgia, unspecified: Secondary | ICD-10-CM | POA: Diagnosis not present

## 2022-07-07 DIAGNOSIS — R9431 Abnormal electrocardiogram [ECG] [EKG]: Secondary | ICD-10-CM | POA: Diagnosis not present

## 2022-07-07 DIAGNOSIS — G20 Parkinson's disease: Secondary | ICD-10-CM | POA: Diagnosis not present

## 2022-07-08 DIAGNOSIS — M549 Dorsalgia, unspecified: Secondary | ICD-10-CM | POA: Diagnosis not present

## 2022-07-08 DIAGNOSIS — T300 Burn of unspecified body region, unspecified degree: Secondary | ICD-10-CM | POA: Diagnosis not present

## 2022-07-08 DIAGNOSIS — R9431 Abnormal electrocardiogram [ECG] [EKG]: Secondary | ICD-10-CM | POA: Diagnosis not present

## 2022-07-08 DIAGNOSIS — G20 Parkinson's disease: Secondary | ICD-10-CM | POA: Diagnosis not present

## 2022-07-09 DIAGNOSIS — M549 Dorsalgia, unspecified: Secondary | ICD-10-CM | POA: Diagnosis not present

## 2022-07-09 DIAGNOSIS — E039 Hypothyroidism, unspecified: Secondary | ICD-10-CM | POA: Diagnosis not present

## 2022-07-09 DIAGNOSIS — M329 Systemic lupus erythematosus, unspecified: Secondary | ICD-10-CM | POA: Diagnosis not present

## 2022-07-09 DIAGNOSIS — G20 Parkinson's disease: Secondary | ICD-10-CM | POA: Diagnosis not present

## 2022-07-13 DIAGNOSIS — G20 Parkinson's disease: Secondary | ICD-10-CM | POA: Diagnosis not present

## 2022-07-13 DIAGNOSIS — M62830 Muscle spasm of back: Secondary | ICD-10-CM | POA: Diagnosis not present

## 2022-07-13 DIAGNOSIS — T2121XD Burn of second degree of chest wall, subsequent encounter: Secondary | ICD-10-CM | POA: Diagnosis not present

## 2022-07-13 DIAGNOSIS — I7300 Raynaud's syndrome without gangrene: Secondary | ICD-10-CM | POA: Diagnosis not present

## 2022-07-13 DIAGNOSIS — T2122XD Burn of second degree of abdominal wall, subsequent encounter: Secondary | ICD-10-CM | POA: Diagnosis not present

## 2022-07-13 DIAGNOSIS — K219 Gastro-esophageal reflux disease without esophagitis: Secondary | ICD-10-CM | POA: Diagnosis not present

## 2022-07-13 DIAGNOSIS — G43909 Migraine, unspecified, not intractable, without status migrainosus: Secondary | ICD-10-CM | POA: Diagnosis not present

## 2022-07-13 DIAGNOSIS — J45909 Unspecified asthma, uncomplicated: Secondary | ICD-10-CM | POA: Diagnosis not present

## 2022-07-13 DIAGNOSIS — M329 Systemic lupus erythematosus, unspecified: Secondary | ICD-10-CM | POA: Diagnosis not present

## 2022-07-13 DIAGNOSIS — E039 Hypothyroidism, unspecified: Secondary | ICD-10-CM | POA: Diagnosis not present

## 2022-07-13 DIAGNOSIS — Z79891 Long term (current) use of opiate analgesic: Secondary | ICD-10-CM | POA: Diagnosis not present

## 2022-07-13 DIAGNOSIS — K589 Irritable bowel syndrome without diarrhea: Secondary | ICD-10-CM | POA: Diagnosis not present

## 2022-07-13 DIAGNOSIS — G8929 Other chronic pain: Secondary | ICD-10-CM | POA: Diagnosis not present

## 2022-07-13 DIAGNOSIS — M545 Low back pain, unspecified: Secondary | ICD-10-CM | POA: Diagnosis not present

## 2022-07-13 DIAGNOSIS — Z8541 Personal history of malignant neoplasm of cervix uteri: Secondary | ICD-10-CM | POA: Diagnosis not present

## 2022-07-13 DIAGNOSIS — F419 Anxiety disorder, unspecified: Secondary | ICD-10-CM | POA: Diagnosis not present

## 2022-07-17 DIAGNOSIS — G20 Parkinson's disease: Secondary | ICD-10-CM | POA: Diagnosis not present

## 2022-07-22 DIAGNOSIS — F3181 Bipolar II disorder: Secondary | ICD-10-CM | POA: Diagnosis not present

## 2022-07-23 ENCOUNTER — Ambulatory Visit (INDEPENDENT_AMBULATORY_CARE_PROVIDER_SITE_OTHER): Payer: Medicare Other | Admitting: Internal Medicine

## 2022-07-23 ENCOUNTER — Encounter: Payer: Self-pay | Admitting: Internal Medicine

## 2022-07-23 VITALS — BP 118/62 | HR 84 | Temp 97.9°F | Ht 65.0 in | Wt 222.2 lb

## 2022-07-23 DIAGNOSIS — M47816 Spondylosis without myelopathy or radiculopathy, lumbar region: Secondary | ICD-10-CM

## 2022-07-23 DIAGNOSIS — Z23 Encounter for immunization: Secondary | ICD-10-CM

## 2022-07-23 DIAGNOSIS — T300 Burn of unspecified body region, unspecified degree: Secondary | ICD-10-CM

## 2022-07-23 DIAGNOSIS — M5134 Other intervertebral disc degeneration, thoracic region: Secondary | ICD-10-CM | POA: Diagnosis not present

## 2022-07-23 DIAGNOSIS — N281 Cyst of kidney, acquired: Secondary | ICD-10-CM

## 2022-07-23 DIAGNOSIS — T2016XA Burn of first degree of forehead and cheek, initial encounter: Secondary | ICD-10-CM

## 2022-07-23 DIAGNOSIS — G8929 Other chronic pain: Secondary | ICD-10-CM | POA: Insufficient documentation

## 2022-07-23 DIAGNOSIS — M62838 Other muscle spasm: Secondary | ICD-10-CM

## 2022-07-23 MED ORDER — METHYLPREDNISOLONE 4 MG PO TBPK: ORAL_TABLET | ORAL | 0 refills | Status: DC

## 2022-07-23 MED ORDER — METHOCARBAMOL 750 MG PO TABS: 750.0000 mg | ORAL_TABLET | Freq: Four times a day (QID) | ORAL | 1 refills | Status: DC | PRN

## 2022-07-23 MED ORDER — MUPIROCIN 2 % EX OINT: 1.0000 | TOPICAL_OINTMENT | Freq: Two times a day (BID) | CUTANEOUS | 0 refills | Status: DC
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
## 2022-07-23 NOTE — Patient Instructions (Addendum)
Discuss with pain clinic about back pain  Tens units  Heat/ice  Continue home PT consider outpatient PT as well  Start steroids next week

## 2022-07-27 DIAGNOSIS — T380X5A Adverse effect of glucocorticoids and synthetic analogues, initial encounter: Secondary | ICD-10-CM | POA: Diagnosis not present

## 2022-07-27 DIAGNOSIS — M818 Other osteoporosis without current pathological fracture: Secondary | ICD-10-CM | POA: Diagnosis not present

## 2022-07-27 DIAGNOSIS — E039 Hypothyroidism, unspecified: Secondary | ICD-10-CM | POA: Diagnosis not present

## 2022-07-27 DIAGNOSIS — E559 Vitamin D deficiency, unspecified: Secondary | ICD-10-CM | POA: Diagnosis not present

## 2022-07-29 DIAGNOSIS — Z79899 Other long term (current) drug therapy: Secondary | ICD-10-CM | POA: Diagnosis not present

## 2022-07-29 DIAGNOSIS — M25562 Pain in left knee: Secondary | ICD-10-CM | POA: Diagnosis not present

## 2022-07-29 DIAGNOSIS — M25551 Pain in right hip: Secondary | ICD-10-CM | POA: Diagnosis not present

## 2022-07-29 DIAGNOSIS — M545 Low back pain, unspecified: Secondary | ICD-10-CM | POA: Diagnosis not present

## 2022-07-29 DIAGNOSIS — M25531 Pain in right wrist: Secondary | ICD-10-CM | POA: Diagnosis not present

## 2022-08-11 ENCOUNTER — Ambulatory Visit (INDEPENDENT_AMBULATORY_CARE_PROVIDER_SITE_OTHER): Payer: Medicare Other | Admitting: Internal Medicine

## 2022-08-11 ENCOUNTER — Encounter: Payer: Self-pay | Admitting: Internal Medicine

## 2022-08-11 VITALS — BP 122/62 | HR 92 | Temp 97.8°F | Ht 65.0 in | Wt 222.8 lb

## 2022-08-11 DIAGNOSIS — S30864D Insect bite (nonvenomous) of vagina and vulva, subsequent encounter: Secondary | ICD-10-CM

## 2022-08-11 DIAGNOSIS — M5416 Radiculopathy, lumbar region: Secondary | ICD-10-CM | POA: Diagnosis not present

## 2022-08-11 DIAGNOSIS — M47816 Spondylosis without myelopathy or radiculopathy, lumbar region: Secondary | ICD-10-CM

## 2022-08-11 DIAGNOSIS — Z1231 Encounter for screening mammogram for malignant neoplasm of breast: Secondary | ICD-10-CM

## 2022-08-11 DIAGNOSIS — M329 Systemic lupus erythematosus, unspecified: Secondary | ICD-10-CM

## 2022-08-11 DIAGNOSIS — W57XXXD Bitten or stung by nonvenomous insect and other nonvenomous arthropods, subsequent encounter: Secondary | ICD-10-CM | POA: Diagnosis not present

## 2022-08-11 DIAGNOSIS — J309 Allergic rhinitis, unspecified: Secondary | ICD-10-CM

## 2022-08-11 DIAGNOSIS — M545 Low back pain, unspecified: Secondary | ICD-10-CM

## 2022-08-11 DIAGNOSIS — J4520 Mild intermittent asthma, uncomplicated: Secondary | ICD-10-CM

## 2022-08-11 DIAGNOSIS — M62838 Other muscle spasm: Secondary | ICD-10-CM

## 2022-08-11 DIAGNOSIS — G8929 Other chronic pain: Secondary | ICD-10-CM

## 2022-08-11 DIAGNOSIS — M5134 Other intervertebral disc degeneration, thoracic region: Secondary | ICD-10-CM

## 2022-08-11 DIAGNOSIS — E039 Hypothyroidism, unspecified: Secondary | ICD-10-CM

## 2022-08-11 MED ORDER — LEVOTHYROXINE SODIUM 75 MCG PO TABS: ORAL_TABLET | ORAL | 3 refills | Status: DC

## 2022-08-11 MED ORDER — METHOCARBAMOL 750 MG PO TABS: 750.0000 mg | ORAL_TABLET | Freq: Four times a day (QID) | ORAL | 5 refills | Status: DC | PRN

## 2022-08-11 MED ORDER — ALBUTEROL SULFATE HFA 108 (90 BASE) MCG/ACT IN AERS: 1.0000 | INHALATION_SPRAY | RESPIRATORY_TRACT | 11 refills | Status: DC | PRN

## 2022-08-11 MED ORDER — FLUTICASONE-SALMETEROL 250-50 MCG/ACT IN AEPB: 1.0000 | INHALATION_SPRAY | Freq: Two times a day (BID) | RESPIRATORY_TRACT | 12 refills | Status: DC

## 2022-08-11 MED ORDER — RIZATRIPTAN BENZOATE 10 MG PO TABS: 10.0000 mg | ORAL_TABLET | ORAL | 5 refills | Status: DC | PRN

## 2022-08-11 MED ORDER — FOLIC ACID 1 MG PO TABS: 1.0000 mg | ORAL_TABLET | Freq: Every day | ORAL | 3 refills | Status: DC

## 2022-08-11 MED ORDER — ALBUTEROL SULFATE (2.5 MG/3ML) 0.083% IN NEBU: 2.5000 mg | INHALATION_SOLUTION | Freq: Four times a day (QID) | RESPIRATORY_TRACT | 12 refills | Status: AC | PRN

## 2022-08-11 MED ORDER — EPINEPHRINE 0.3 MG/0.3ML IJ SOAJ: 0.3000 mg | INTRAMUSCULAR | 2 refills | Status: DC | PRN

## 2022-08-11 MED ORDER — MONTELUKAST SODIUM 10 MG PO TABS: ORAL_TABLET | ORAL | 3 refills | Status: DC

## 2022-08-11 NOTE — Progress Notes (Signed)
Chief Complaint  Patient presents with   Follow-up    6 month f/u    F/u with daughter  1. Low back pain w/o sciatica and back spasms doing PT at home but they rec outpatient PT and will refer to Dr. Sharlet Salina prior imaging with arthritis in lower back. Tried oral steroids and pain meds per pain clinic w/o relief 2. Diffuse body pain/joint pain with h/o knees, arms hip pain worse right sided now having left hip, left shoulder left elbow pain has nonspecific rheum disorder established with [facility] but also rec she call her ortho Dr. Sherley Bounds  She tried oral steroids but this did not help her back pain 5-6/10 daily since her last visit  3. Note for [place] not to go 9/25-10/1/23 through travel agency due to #2 given today  4. C/o tick bite to left labia and she wants blood testing for lyme and RMSF   Review of Systems  Constitutional:  Negative for weight loss.  HENT:  Negative for hearing loss.   Eyes:  Negative for blurred vision.  Respiratory:  Negative for shortness of breath.   Cardiovascular:  Negative for chest pain.  Gastrointestinal:  Negative for abdominal pain and blood in stool.  Genitourinary:  Negative for dysuria.  Musculoskeletal:  Positive for back pain and joint pain. Negative for falls.  Skin:  Negative for rash.  Neurological:  Negative for headaches.  Psychiatric/Behavioral:  Negative for depression.    Past Medical History:  Diagnosis Date   ADHD    Anemia    ANXIETY 06/06/2007   Arthritis    related to lupus   Asthma    BACK PAIN 02/22/2009   Cervical cancer ([place])    LEEP 2005    Chicken pox    Chronic pain    goes to pain management provider   Collagen vascular disease ([place])    Connective tissue disease ([place])    DEPRESSION 06/06/2007   Essential tremor    Fibromyalgia    Fibromyalgia    FREQUENCY, URINARY 07/07/2007   Gastritis    GERD 06/06/2007   Glaucoma    Headache    h/o migraines    History of hiatal hernia    History of kidney stones     HYPOTHYROIDISM 06/06/2007   INTERSTITIAL CYSTITIS 09/11/2010   Lupus ([place]) 2006   Lupus ([place])    Menopause    Migraine    Mitral valve regurgitation    NEPHROLITHIASIS, HX OF 11/05/2010   Neuropathy    Neuropathy    OCD (obsessive compulsive disorder)    Optic neuritis    2005   OSTEOPENIA 10/14/2009   Osteoporosis    OVERACTIVE BLADDER 10/14/2009   PAIN, CHRONIC NEC 06/06/2007   Panniculitis    Parkinson disease ([place]) 04/03/2019   POLYARTHRITIS 08/19/2007   Raynaud's disease    Sicca syndrome ([place])    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 Marcelline Mates [facility]    Vasculitis [facility])    WEIGHT GAIN 02/22/2009   Past Surgical History:  Procedure Laterality Date   APPENDECTOMY     CERVICAL BIOPSY  W/ LOOP ELECTRODE EXCISION  2005   CHOLECYSTECTOMY     ESOPHAGOGASTRODUODENOSCOPY (EGD) WITH PROPOFOL N/A 08/27/2017   Procedure: ESOPHAGOGASTRODUODENOSCOPY (EGD) WITH PROPOFOL;  Surgeon: Manya Silvas, MD;  Location: [facility] ENDOSCOPY;  Service: Endoscopy;  Laterality: N/A;   HAND SURGERY     repair of left metatarsal    HARDWARE REMOVAL Right  02/16/2017   Procedure: HARDWARE REMOVAL FROM HIP;  Surgeon: Hessie Knows, MD;  Location: [facility] ORS;  Service: Orthopedics;  Laterality: Right;   JOINT REPLACEMENT     OTHER SURGICAL HISTORY     left 3rd metatarsal fracture repair 2011    REVISION TOTAL HIP ARTHROPLASTY  06/2009   replacement 2010 then screw and plate removal 1601; right hip   TONSILLECTOMY     TONSILLECTOMY     wrist  ligament repair bilateral     WRIST RECONSTRUCTION     WRIST SURGERY     laceration of right wrist ligaments   wrist surgery     laceration of left wrist surgery    Family History  Problem Relation Age of Onset   Hypertension Mother    Arthritis Mother    Heart disease Mother        afib   Asthma Sister    Asthma Daughter    Arthritis Daughter    Heart disease Daughter        ?heart condition on BB   ADD / ADHD Daughter     Pancreatic cancer Father    Cancer Father        pancreatitic    Diabetes Maternal Grandmother    Heart disease Maternal Grandmother    Arthritis Maternal Grandmother    Hypertension Maternal Grandmother    Dementia Maternal Grandmother    Colon cancer Maternal Uncle 41   Crohn's disease Maternal Aunt    Breast cancer Maternal Aunt 65   Diabetes Maternal Aunt    Breast cancer Cousin 16       maternal   Cancer Cousin        m cousin breat cancer s/p removal both breasts    Social History   Socioeconomic History   Marital status: Single    Spouse name: Not on file   Number of children: 1   Years of education: Not on file   Highest education level: Not on file  Occupational History   Occupation: DISABLED    Employer: UNEMPLOYED  Tobacco Use   Smoking status: Never   Smokeless tobacco: Never  Vaping Use   Vaping Use: Never used  Substance and Sexual Activity   Alcohol use: No   Drug use: No   Sexual activity: Not Currently    Birth control/protection: None, Post-menopausal  Other Topics Concern   Not on file  Social History Narrative   She was previously a pediatrician, 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    She lives with daughter and mother      Social Determinants of Health   Financial Resource Strain: Low Risk  (11/27/2021)   Overall Financial Resource Strain (CARDIA)    Difficulty of Paying Living Expenses: Not hard at all  Food Insecurity: No Food Insecurity (11/27/2021)   Hunger Vital Sign    Worried About Running Out of Food in the Last Year: Never true    Ran Out of Food in the Last Year: Never true  Transportation Needs: No Transportation Needs (11/27/2021)   PRAPARE - Hydrologist (Medical): No    Lack of Transportation (Non-Medical): No  Physical Activity: Insufficiently Active (11/27/2021)   Exercise Vital Sign    Days of Exercise per Week: 7 days    Minutes of Exercise per Session: 20 min  Stress: No  Stress Concern Present (11/27/2021)   [place]  Feeling of Stress : Not at all  Social Connections: Unknown (11/27/2021)   Social Connection and Isolation Panel [NHANES]    Frequency of Communication with Friends and Family: More than three times a week    Frequency of Social Gatherings with Friends and Family: More than three times a week    Attends Religious Services: Not on file    Active Member of Clubs or Organizations: Not on file    Attends Archivist Meetings: Not on file    Marital Status: Not on file  Intimate Partner Violence: Not At Risk (11/27/2021)   Humiliation, Afraid, Rape, and Kick questionnaire    Fear of Current or Ex-Partner: No    Emotionally Abused: No    Physically Abused: No    Sexually Abused: No   Current Meds  Medication Sig   acetaminophen (TYLENOL) 650 MG CR tablet Take 650 mg by mouth every 8 (eight) hours as needed for pain.   albuterol (PROVENTIL) (2.5 MG/3ML) 0.083% nebulizer solution Take 3 mLs (2.5 mg total) by nebulization every 6 (six) hours as needed for wheezing or shortness of breath.   albuterol (VENTOLIN HFA) 108 (90 Base) MCG/ACT inhaler Inhale 1-2 puffs into the lungs every 4 (four) hours as needed for wheezing or shortness of breath.   Ascorbic Acid (VITAMIN C) 500 MG CHEW Chew 2,500 mg by mouth daily.   Biotin 5000 MCG CHEW Chew 2 each by mouth daily.   carbidopa-levodopa (SINEMET CR) 50-200 MG tablet Take 1 tablet by mouth 5 (five) times daily.   Cholecalciferol (VITAMIN D3) 2000 units capsule Vitamin D3 2,000 unit capsule   dapsone 25 MG tablet Take by mouth daily.    diclofenac sodium (VOLTAREN) 1 % GEL Apply 2 g topically 4 (four) times daily as needed (for knee & finger pain.).    dicyclomine (BENTYL) 20 MG tablet Take by mouth.   DULoxetine (CYMBALTA) 60 MG capsule Take 60 mg by mouth 2 (two) times daily.   Ferrous Sulfate (IRON PO) Take by mouth.    fluticasone-salmeterol (ADVAIR DISKUS) 250-50 MCG/ACT AEPB Inhale 1 puff into the lungs in the morning and at bedtime. Rinse mouth did not tolerate wixela brand only   folic acid (FOLVITE) 1 MG tablet Take 1 tablet (1 mg total) by mouth daily.   hydrocortisone 2.5 % cream Apply topically 2 (two) times daily. Prn under arms   hydroxychloroquine (PLAQUENIL) 200 MG tablet Plaquenil 200 mg tablet  Take 1 tablet twice a day by oral route after meals for 90 days.   ibuprofen (ADVIL,MOTRIN) 800 MG tablet Take 1 tablet (800 mg total) by mouth every 8 (eight) hours as needed.   levothyroxine (SYNTHROID) 75 MCG tablet TAKE 1 TABLET BY MOUTH DAILY 30 MINUTES BEFORE BREAKFAST   LORazepam (ATIVAN) 2 MG tablet Take 1-2 mg by mouth See admin instructions. 1 MG DAILY AS NEEDED FOR ANXIETY & SCHEDULED AT BEDTIME EACH NIGHT   methocarbamol (ROBAXIN) 750 MG tablet Take 1 tablet (750 mg total) by mouth every 6 (six) hours as needed.   methylPREDNISolone (MEDROL DOSEPAK) 4 MG TBPK tablet In am with food   montelukast (SINGULAIR) 10 MG tablet TAKE 1 TABLET BY MOUTH EVERY NIGHT AT BEDTIME   morphine (MSIR) 15 MG tablet SMARTSIG:1 Tablet(s) By Mouth Every 12 Hours PRN   Multiple Vitamins-Minerals (MULTI-VITAMIN GUMMIES PO) Multi Vitamin  GUMMIES   mupirocin ointment (BACTROBAN) 2 % Apply 1 Application topically 2 (two) times daily. Prn burns right chest and lower chest x  10-14 days   neomycin-polymyxin-hydrocortisone (CORTISPORIN) OTIC solution Place 3 drops into the right ear 4 (four) times daily.   ondansetron (ZOFRAN) 4 MG tablet Take 1 tablet (4 mg total) by mouth every 8 (eight) hours as needed for nausea or vomiting.   pantoprazole (PROTONIX) 40 MG tablet Take 40 mg by mouth 2 (two) times daily.   PARoxetine (PAXIL) 10 MG tablet Take 10 mg by mouth at bedtime.   Polyvinyl Alcohol-Povidone (REFRESH OP) Place 1 drop into both eyes 2 (two) times daily.   Probiotic Product (PROBIOTIC PO) Probiotic 10 billion cell  capsule  daily   rizatriptan (MAXALT) 10 MG tablet Take 1 tablet (10 mg total) by mouth as needed for migraine. May repeat in 2 hours if needed. Max dose 20 mg in 1 day   Sodium Chloride 3 % AERS Saline Nasal Mist 3 %  spray nose deeply every hour or two and blow nose   topiramate (TOPAMAX) 50 MG tablet Take 150 mg by mouth in the morning and at bedtime.   [DISCONTINUED] EPINEPHrine 0.3 mg/0.3 mL IJ SOAJ injection Inject 0.3 mg into the muscle as needed for anaphylaxis.   Allergies  Allergen Reactions   Bee Venom Anaphylaxis, Itching and Swelling    Affected area Affected area Affected area Affected area    Fish Allergy Hives, Swelling and Rash    Facial swelling  Facial swelling  Facial swelling Facial swelling  Facial swelling    Latex Anaphylaxis, Rash and Shortness Of Breath    Rash  Rash     Prasterone Other (See Comments) and Nausea And Vomiting    rash Other reaction(s): Headache Headaches.   Shellfish Allergy Hives, Other (See Comments), Rash and Swelling    Facial swelling Uncoded Allergy. Allergen: seafood Uncoded Allergy. Allergen: CATS, Other Reaction: itch, wheezing Uncoded Allergy. Allergen: COMPAZINE, Other Reaction: tremors Facial swelling Facial swelling Uncoded Allergy. Allergen: seafood Uncoded Allergy. Allergen: CATS, Other Reaction: itch, wheezing Uncoded Allergy. Allergen: COMPAZINE, Other Reaction: tremors Facial swelling Uncoded Allergy. Allergen: seafood Uncoded Allergy. Allergen: CATS, Other Reaction: itch, wheezing Uncoded Allergy. Allergen: COMPAZINE, Other Reaction: tremors Facial swelling Facial swelling Uncoded Allergy. Allergen: seafood Uncoded Allergy. Allergen: CATS, Other Reaction: itch, wheezing Uncoded Allergy. Allergen: COMPAZINE, Other Reaction: tremors Facial swelling Facial swelling Uncoded Allergy. Allergen: seafood Uncoded Allergy. Allergen: CATS, Other Reaction: itch, wheezing Uncoded Allergy. Allergen: COMPAZINE,  Other Reaction: tremors    Shellfish-Derived Products Hives, Other (See Comments), Rash and Swelling    Facial swelling Uncoded Allergy. Allergen: seafood Uncoded Allergy. Allergen: CATS, Other Reaction: itch, wheezing Uncoded Allergy. Allergen: COMPAZINE, Other Reaction: tremors Facial swelling Facial swelling Uncoded Allergy. Allergen: seafood Uncoded Allergy. Allergen: CATS, Other Reaction: itch, wheezing Uncoded Allergy. Allergen: COMPAZINE, Other Reaction: tremors Facial swelling Uncoded Allergy. Allergen: seafood Uncoded Allergy. Allergen: CATS, Other Reaction: itch, wheezing Uncoded Allergy. Allergen: COMPAZINE, Other Reaction: tremors Facial swelling   Compazine [Prochlorperazine Edisylate] Other (See Comments)    tremors   Dhea [Nutritional Supplements] Other (See Comments)    Headaches.   Dilaudid [Hydromorphone Hcl]     ? reaction   Famotidine Other (See Comments) and Diarrhea    Other reaction(s): Headache Gas   Lactose    Lactose Intolerance (Gi)    Other Other (See Comments)    Other reaction(s): Unknown    Prochlorperazine     Other reaction(s): Other (See Comments) ticks   Promethazine     Other reaction(s): Other (See Comments) Ticks   Promethazine Hcl Other (See Comments)  CNS disorder   Reclast [Zoledronic Acid]     Weakness could not move limbs, fatigue, increase sleep   Clarithromycin Hives and Rash   Clotrimazole Swelling and Rash   Hydromorphone Hcl Rash and Hives    "Rash all over"   Penicillins Rash and Other (See Comments)    Has patient had a PCN reaction causing immediate rash, facial/tongue/throat swelling, SOB or lightheadedness with hypotension:No Has patient had a PCN reaction causing severe rash involving mucus membranes or skin necrosis:No Has patient had a PCN reaction that required hospitalization:No Has patient had a PCN reaction occurring within the last 10 years:No If all of the above answers are "NO", then may proceed with  Cephalosporin use.   No results found for this or any previous visit (from the past 2160 hour(s)). Objective  Body mass index is 37.08 kg/m. Wt Readings from Last 3 Encounters:  08/11/22 222 lb 12.8 oz (101.1 kg)  07/23/22 222 lb 3.2 oz (100.8 kg)  06/01/22 220 lb 2 oz (99.8 kg)   Temp Readings from Last 3 Encounters:  08/11/22 97.8 F (36.6 C) (Oral)  07/23/22 97.9 F (36.6 C) (Oral)  06/01/22 98.4 F (36.9 C) (Oral)   BP Readings from Last 3 Encounters:  08/11/22 122/62  07/23/22 118/62  06/01/22 110/72   Pulse Readings from Last 3 Encounters:  08/11/22 92  07/23/22 84  06/01/22 98    Physical Exam Vitals and nursing note reviewed.  Constitutional:      Appearance: Normal appearance. She is well-developed and well-groomed.  HENT:     Head: Normocephalic and atraumatic.  Eyes:     Conjunctiva/sclera: Conjunctivae normal.     Pupils: Pupils are equal, round, and reactive to light.  Cardiovascular:     Rate and Rhythm: Normal rate and regular rhythm.     Heart sounds: Normal heart sounds. No murmur heard. Pulmonary:     Effort: Pulmonary effort is normal.     Breath sounds: Normal breath sounds.  Abdominal:     General: Abdomen is flat. Bowel sounds are normal.     Tenderness: There is no abdominal tenderness.  Musculoskeletal:        General: No tenderness.  Skin:    General: Skin is warm and dry.  Neurological:     General: No focal deficit present.     Mental Status: She is alert and oriented to person, place, and time. Mental status is at baseline.     Cranial Nerves: Cranial nerves 2-12 are intact.     Motor: Motor function is intact.     Coordination: Coordination is intact.     Gait: Gait is intact.     Comments: BL walks with cane  Psychiatric:        Attention and Perception: Attention and perception normal.        Mood and Affect: Mood and affect normal.        Speech: Speech normal.        Behavior: Behavior normal. Behavior is cooperative.         Thought Content: Thought content normal.        Cognition and Memory: Cognition and memory normal.        Judgment: Judgment normal.     Assessment  Plan  Chronic bilateral low back pain, unspecified whether sciatica present - Plan: Ambulatory referral to Physical Therapy, Ambulatory referral to Physical Medicine Rehab Lumbar radiculopathy - Plan: Ambulatory referral to Physical Therapy, Ambulatory referral to Physical Medicine Rehab  Tick bite of vagina, subsequent encounter no obvious tick - Plan: B. burgdorfi antibodies, [place] mtn spotted fvr abs pnl(IgG+IgM)  Other chronic pain - Plan: Ambulatory referral to Physical Therapy, Ambulatory referral to Physical Medicine Rehab  F/u pain clinic F/u rheumatology She may need to call Dr.Menz ortho and fu  HM Had flu shot utd 07/23/22 Had pna 23 01/21/22  Consider prevnar 20 in 12/2026  prevnar 13 09/03/2014 Tdap 10/19/16  covid vx had 4/4 pfizer per pt had 5 shots HT [facility] in [place] about shingrix vaccine in future prev discussed rx today   Immune MMR and hep B   Colonoscopy 12/17/14 h/o polyps Dr. Rayann Heman IH/EH polyp hyperplastic consider repeat in 5-10 years FH m uncle colon cancer  EGD 08/27/2017 negative bxs +hiatal hernia  [facility]    DEXA 06/29/17 osteopenia h/o osteoporosis f/u [facility] in [place] -f/u [facility]. Prudencio Burly [facility] and he was to try Reclast again though listed on her allergy list he does not think true allergy  Ordered    Mammogram 02/05/22 negative    Pap 07/01/18 Dr. Marcelline Mates neg pap neg HPV  Consider repeat pap 3-5 years  Pt to call to schedule   See CT chest     IMPRESSION: 1. No CT evidence for acute pulmonary embolus. 2. Symmetric dependent ground-glass attenuation in the lungs, similar to prior study. Imaging features likely related to hypo expansion/ compressive atelectasis.   Dermatology Barnetta Chapel saw early 10/2019   rec vitamin D3 4000 Iu daily vitamin D 06/08/19 was  24    Vitamin N46 270 3/50/09  Folic acid >38 1/82/99    Eye exam had 10/2019 will return 01/28/20    Of note [facility] Dr. Nancy Fetter f/u sch in 01/2020, eye exam 01/28/20 to f/u HCQ monitoring  rec healthy diet and exercise      [facility] neuro Dr. Angela Nevin rheum Dr. Evette Cristal [facility] rheumatology [facility] as of 03/20/21 wants new referral [facility] Dr. Alice Reichert, Effie Berkshire  Cards Dr. Josefa Half Ortho Dr. Mendel Ryder endocrine Dr. Prudencio Burly >Dr. Jamelle Haring [facility] Dr. Domenica Fail   Provider: Dr. Olivia Mackie McLean-Scocuzza-Internal Medicine

## 2022-08-11 NOTE — Patient Instructions (Addendum)
Mederma gel or silicone gel sheets  Organic scar mama target  Marland Kitchen C. Chasnis, DO 5.0 7 Google reviews Doctor, general practice in [place], [place] online care: [email or web address] Address: [address], [place], [place] [address] Phone: [phone]   [facility] 5.0 14 Google reviews Physical therapist in [place], [place] Get online care: [email or web address] Address: [address], [place], [place] [address] Hours:  Open ? Closes 7:30?PM Phone: [phone]  Epidural Steroid Injection  An epidural steroid injection is a shot of steroid medicine, also called cortisone, and a numbing medicine that is given into the epidural space. This space is between the spinal cord and the bones of the back. This shot helps relieve pain caused by an irritated or swollen nerve root. The amount of pain relief you get from the injection depends on what is causing the nerve to be swollen and irritated, and how long your pain lasts. You may have a period of slightly more pain after your injection, before the steroid medicine takes effect. This medicine usually starts working within 1-3 days. In some cases, you might need 7-10 days to feel the full effect. Tell your health care provider about: Any allergies you have. All medicines you are taking, including vitamins, herbs, eye drops, creams, and over-the-counter medicines. Any problems you or family members have had with anesthetic medicines. Any bleeding problems you have. Any surgeries you have had. Any medical conditions you have. Whether you are pregnant or may be pregnant. What are the risks? Your health care provider will talk with you about risks. These may include: Headache. Bleeding. Infection. Allergic reaction to medicines or dyes. Nerve damage. Not being able to move (paralysis). This is rare. What happens before the procedure? Medicines You may be given medicines to lower anxiety. Ask  your health care provider about: Changing or stopping your regular medicines. These include any diabetes medicines or blood thinners you take. Taking medicines such as aspirin and ibuprofen. These medicines can thin your blood. Do not take them unless your health care provider tells you to. Taking over-the-counter medicines, vitamins, herbs, and supplements. General instructions Follow instructions from your health care provider about what you may eat and drink. Ask your health care provider what steps will be taken to help prevent infection. If you will be going home right after the procedure, plan to have a responsible adult: Take you home from the hospital or clinic. You will not be allowed to drive. Care for you for the time you are told. What happens during the procedure?  An IV will be inserted into one of your veins. You may be given a sedative. This helps you relax. You will be asked to lie on your side or sit. The injection site will be cleaned. An X-ray machine will be used to guide the needle as close as possible to the nerve causing pain. A needle will be put through your skin into the epidural space. This may cause you some discomfort. Contrast dye may be injected at the site to make sure that the steroid medicine will be sent to the exact place it needs to go. The steroid medicine and a numbing medicine (local anesthesia) will be injected into the epidural space for pain relief. The needle and IV will be removed. A bandage (dressing) will be put over the injection site. The procedure may vary among health care providers and hospitals. What happens after the procedure? Your blood pressure, heart rate, breathing rate, and  blood oxygen level will be monitored until you leave the hospital or clinic. Your arm or leg may feel weak or numb for a few hours. Summary An epidural steroid injection is a shot of steroid medicine and a numbing medicine that is given into the epidural  space. The shot helps relieve pain caused by an irritated or swollen nerve root. The steroid medicine usually starts working within 1-3 days. In some cases, you might need 7-10 days to feel the full effect. This information is not intended to replace advice given to you by your health care provider. Make sure you discuss any questions you have with your health care provider. Document Revised: 03/10/2022 Document Reviewed: 03/10/2022 Elsevier Patient Education  [place].

## 2022-08-13 DIAGNOSIS — F3181 Bipolar II disorder: Secondary | ICD-10-CM | POA: Diagnosis not present

## 2022-08-13 LAB — B. BURGDORFI ANTIBODIES: B burgdorferi Ab IgG+IgM: <0.9 index

## 2022-08-13 LAB — ROCKY MTN SPOTTED FVR ABS PNL(IGG+IGM)
RMSF IgG: NOT DETECTED
RMSF IgM: NOT DETECTED

## 2022-08-18 DIAGNOSIS — E039 Hypothyroidism, unspecified: Secondary | ICD-10-CM | POA: Diagnosis not present

## 2022-08-18 DIAGNOSIS — F419 Anxiety disorder, unspecified: Secondary | ICD-10-CM | POA: Diagnosis not present

## 2022-08-18 DIAGNOSIS — K589 Irritable bowel syndrome without diarrhea: Secondary | ICD-10-CM | POA: Diagnosis not present

## 2022-08-18 DIAGNOSIS — Z79891 Long term (current) use of opiate analgesic: Secondary | ICD-10-CM | POA: Diagnosis not present

## 2022-08-18 DIAGNOSIS — G43909 Migraine, unspecified, not intractable, without status migrainosus: Secondary | ICD-10-CM | POA: Diagnosis not present

## 2022-08-18 DIAGNOSIS — I7300 Raynaud's syndrome without gangrene: Secondary | ICD-10-CM | POA: Diagnosis not present

## 2022-08-18 DIAGNOSIS — T2121XD Burn of second degree of chest wall, subsequent encounter: Secondary | ICD-10-CM | POA: Diagnosis not present

## 2022-08-18 DIAGNOSIS — T2122XD Burn of second degree of abdominal wall, subsequent encounter: Secondary | ICD-10-CM | POA: Diagnosis not present

## 2022-08-18 DIAGNOSIS — M62830 Muscle spasm of back: Secondary | ICD-10-CM | POA: Diagnosis not present

## 2022-08-18 DIAGNOSIS — Z8541 Personal history of malignant neoplasm of cervix uteri: Secondary | ICD-10-CM | POA: Diagnosis not present

## 2022-08-18 DIAGNOSIS — G8929 Other chronic pain: Secondary | ICD-10-CM | POA: Diagnosis not present

## 2022-08-18 DIAGNOSIS — J45909 Unspecified asthma, uncomplicated: Secondary | ICD-10-CM | POA: Diagnosis not present

## 2022-08-18 DIAGNOSIS — M329 Systemic lupus erythematosus, unspecified: Secondary | ICD-10-CM | POA: Diagnosis not present

## 2022-08-18 DIAGNOSIS — G20 Parkinson's disease: Secondary | ICD-10-CM | POA: Diagnosis not present

## 2022-08-18 DIAGNOSIS — M545 Low back pain, unspecified: Secondary | ICD-10-CM | POA: Diagnosis not present

## 2022-08-18 DIAGNOSIS — K219 Gastro-esophageal reflux disease without esophagitis: Secondary | ICD-10-CM | POA: Diagnosis not present

## 2022-08-19 DIAGNOSIS — F3181 Bipolar II disorder: Secondary | ICD-10-CM | POA: Diagnosis not present

## 2022-08-21 DIAGNOSIS — H16223 Keratoconjunctivitis sicca, not specified as Sjogren's, bilateral: Secondary | ICD-10-CM | POA: Diagnosis not present

## 2022-08-21 DIAGNOSIS — Z79899 Other long term (current) drug therapy: Secondary | ICD-10-CM | POA: Diagnosis not present

## 2022-08-21 DIAGNOSIS — H40053 Ocular hypertension, bilateral: Secondary | ICD-10-CM | POA: Diagnosis not present

## 2022-08-31 DIAGNOSIS — Z7982 Long term (current) use of aspirin: Secondary | ICD-10-CM | POA: Diagnosis not present

## 2022-08-31 DIAGNOSIS — R10819 Abdominal tenderness, unspecified site: Secondary | ICD-10-CM | POA: Diagnosis not present

## 2022-08-31 DIAGNOSIS — R1032 Left lower quadrant pain: Secondary | ICD-10-CM | POA: Diagnosis not present

## 2022-08-31 DIAGNOSIS — R1310 Dysphagia, unspecified: Secondary | ICD-10-CM | POA: Diagnosis not present

## 2022-08-31 DIAGNOSIS — R110 Nausea: Secondary | ICD-10-CM | POA: Diagnosis not present

## 2022-08-31 DIAGNOSIS — R197 Diarrhea, unspecified: Secondary | ICD-10-CM | POA: Diagnosis not present

## 2022-08-31 DIAGNOSIS — Z9049 Acquired absence of other specified parts of digestive tract: Secondary | ICD-10-CM | POA: Diagnosis not present

## 2022-08-31 DIAGNOSIS — Z79890 Hormone replacement therapy (postmenopausal): Secondary | ICD-10-CM | POA: Diagnosis not present

## 2022-08-31 DIAGNOSIS — R42 Dizziness and giddiness: Secondary | ICD-10-CM | POA: Diagnosis not present

## 2022-08-31 DIAGNOSIS — E039 Hypothyroidism, unspecified: Secondary | ICD-10-CM | POA: Diagnosis not present

## 2022-08-31 DIAGNOSIS — G20A1 Parkinson's disease without dyskinesia, without mention of fluctuations: Secondary | ICD-10-CM | POA: Diagnosis not present

## 2022-08-31 DIAGNOSIS — J45909 Unspecified asthma, uncomplicated: Secondary | ICD-10-CM | POA: Diagnosis not present

## 2022-08-31 DIAGNOSIS — Z79899 Other long term (current) drug therapy: Secondary | ICD-10-CM | POA: Diagnosis not present

## 2022-08-31 DIAGNOSIS — K219 Gastro-esophageal reflux disease without esophagitis: Secondary | ICD-10-CM | POA: Diagnosis not present

## 2022-08-31 DIAGNOSIS — R634 Abnormal weight loss: Secondary | ICD-10-CM | POA: Diagnosis not present

## 2022-08-31 DIAGNOSIS — K589 Irritable bowel syndrome without diarrhea: Secondary | ICD-10-CM | POA: Diagnosis not present

## 2022-08-31 DIAGNOSIS — M329 Systemic lupus erythematosus, unspecified: Secondary | ICD-10-CM | POA: Diagnosis not present

## 2022-08-31 DIAGNOSIS — Z8541 Personal history of malignant neoplasm of cervix uteri: Secondary | ICD-10-CM | POA: Diagnosis not present

## 2022-09-01 ENCOUNTER — Other Ambulatory Visit: Payer: Self-pay | Admitting: Endocrinology

## 2022-09-01 DIAGNOSIS — M818 Other osteoporosis without current pathological fracture: Secondary | ICD-10-CM

## 2022-09-01 DIAGNOSIS — R197 Diarrhea, unspecified: Secondary | ICD-10-CM | POA: Diagnosis not present

## 2022-09-08 DIAGNOSIS — R109 Unspecified abdominal pain: Secondary | ICD-10-CM | POA: Diagnosis not present

## 2022-09-08 DIAGNOSIS — R197 Diarrhea, unspecified: Secondary | ICD-10-CM | POA: Diagnosis not present

## 2022-09-11 DIAGNOSIS — G20C Parkinsonism, unspecified: Secondary | ICD-10-CM | POA: Diagnosis not present

## 2022-09-11 DIAGNOSIS — R1310 Dysphagia, unspecified: Secondary | ICD-10-CM | POA: Diagnosis not present

## 2022-09-11 DIAGNOSIS — G43119 Migraine with aura, intractable, without status migrainosus: Secondary | ICD-10-CM | POA: Diagnosis not present

## 2022-09-11 DIAGNOSIS — R4189 Other symptoms and signs involving cognitive functions and awareness: Secondary | ICD-10-CM | POA: Diagnosis not present

## 2022-09-12 DIAGNOSIS — Z885 Allergy status to narcotic agent status: Secondary | ICD-10-CM | POA: Diagnosis not present

## 2022-09-12 DIAGNOSIS — E039 Hypothyroidism, unspecified: Secondary | ICD-10-CM | POA: Diagnosis not present

## 2022-09-12 DIAGNOSIS — R109 Unspecified abdominal pain: Secondary | ICD-10-CM | POA: Diagnosis not present

## 2022-09-12 DIAGNOSIS — N289 Disorder of kidney and ureter, unspecified: Secondary | ICD-10-CM | POA: Diagnosis not present

## 2022-09-12 DIAGNOSIS — Z881 Allergy status to other antibiotic agents status: Secondary | ICD-10-CM | POA: Diagnosis not present

## 2022-09-12 DIAGNOSIS — R42 Dizziness and giddiness: Secondary | ICD-10-CM | POA: Diagnosis not present

## 2022-09-12 DIAGNOSIS — R1030 Lower abdominal pain, unspecified: Secondary | ICD-10-CM | POA: Diagnosis not present

## 2022-09-12 DIAGNOSIS — Z79899 Other long term (current) drug therapy: Secondary | ICD-10-CM | POA: Diagnosis not present

## 2022-09-12 DIAGNOSIS — Z79890 Hormone replacement therapy (postmenopausal): Secondary | ICD-10-CM | POA: Diagnosis not present

## 2022-09-12 DIAGNOSIS — Z7982 Long term (current) use of aspirin: Secondary | ICD-10-CM | POA: Diagnosis not present

## 2022-09-12 DIAGNOSIS — R937 Abnormal findings on diagnostic imaging of other parts of musculoskeletal system: Secondary | ICD-10-CM | POA: Diagnosis not present

## 2022-09-12 DIAGNOSIS — F419 Anxiety disorder, unspecified: Secondary | ICD-10-CM | POA: Diagnosis not present

## 2022-09-12 DIAGNOSIS — R197 Diarrhea, unspecified: Secondary | ICD-10-CM | POA: Diagnosis not present

## 2022-09-12 DIAGNOSIS — Z880 Allergy status to penicillin: Secondary | ICD-10-CM | POA: Diagnosis not present

## 2022-09-12 DIAGNOSIS — R110 Nausea: Secondary | ICD-10-CM | POA: Diagnosis not present

## 2022-09-12 DIAGNOSIS — K5730 Diverticulosis of large intestine without perforation or abscess without bleeding: Secondary | ICD-10-CM | POA: Diagnosis not present

## 2022-09-22 ENCOUNTER — Telehealth: Payer: Self-pay | Admitting: Family

## 2022-09-22 ENCOUNTER — Ambulatory Visit (INDEPENDENT_AMBULATORY_CARE_PROVIDER_SITE_OTHER): Payer: Medicare Other | Admitting: Family

## 2022-09-22 ENCOUNTER — Encounter: Payer: Self-pay | Admitting: Family

## 2022-09-22 VITALS — BP 138/88 | HR 100 | Temp 98.0°F | Ht 70.0 in | Wt 203.6 lb

## 2022-09-22 DIAGNOSIS — K529 Noninfective gastroenteritis and colitis, unspecified: Secondary | ICD-10-CM | POA: Insufficient documentation

## 2022-09-22 DIAGNOSIS — R197 Diarrhea, unspecified: Secondary | ICD-10-CM | POA: Diagnosis not present

## 2022-09-22 DIAGNOSIS — N281 Cyst of kidney, acquired: Secondary | ICD-10-CM | POA: Diagnosis not present

## 2022-09-22 LAB — COMPREHENSIVE METABOLIC PANEL
ALT: 14 U/L (ref 0–35)
AST: 15 U/L (ref 0–37)
Albumin: 4.8 g/dL (ref 3.5–5.2)
Alkaline Phosphatase: 78 U/L (ref 39–117)
BUN: 10 mg/dL (ref 6–23)
CO2: 32 mEq/L (ref 19–32)
Calcium: 9.7 mg/dL (ref 8.4–10.5)
Chloride: 100 mEq/L (ref 96–112)
Creatinine, Ser: 1.09 mg/dL (ref 0.40–1.20)
GFR: 56.23 mL/min — ABNORMAL LOW (ref >60.00)
Glucose, Bld: 97 mg/dL (ref 70–99)
Potassium: 3.6 mEq/L (ref 3.5–5.1)
Sodium: 141 mEq/L (ref 135–145)
Total Bilirubin: 0.5 mg/dL (ref 0.2–1.2)
Total Protein: 7 g/dL (ref 6.0–8.3)

## 2022-09-22 LAB — C-REACTIVE PROTEIN: CRP: <1 mg/dL (ref 0.5–20.0)

## 2022-09-22 LAB — SEDIMENTATION RATE: Sed Rate: 10 mm/hr (ref 0–30)

## 2022-09-22 NOTE — Assessment & Plan Note (Signed)
[place] ab 04/17/19 right renal cyst 1.9 cm CT abdomen and pelvis 09/12/22 showed 1.5cm right renal cyst.  Advised renal ultrasound to further characterize cyst to ensure it is simple and does not warrant further evaluation.  She politely declines at this time.

## 2022-09-22 NOTE — Patient Instructions (Addendum)
Please let  me know if you would do further investigate right renal lesion.  It does appear to be decreased in size from 2020 however a dedicated renal ultrasound would be appropriate to ensure that it is simple and does not warrant further surveillance  I will work diligently to get you seen by [place] clinic sooner and to arrange endoscopy, colonoscopy.    For now, continue Protonix 40 mg twice a day before breakfast and before dinner.  It is ideal to take 30 to 1-hour prior to a meal.    You may also start over-the-counter Pepcid AC 20 mg taken in the evening.   may continue Zofran for nausea.  I ordered stool cultures.  It is imperative that you return these to [place] versus our office.  The medical mall has a quicker turnaround of labs.

## 2022-09-22 NOTE — Assessment & Plan Note (Addendum)
Ongoing nonbloody diarrhea after eating. multiple emergency room visits. Celiac screen negative 09/08/22.Afebrile.  Diffuse abdominal tenderness on exam today.  Reviewed CT abdomen pelvis obtained in ED 09/12/22. EGD Dr Tiffany Kocher 08/27/2017 which showed gastritis Colonoscopy 12/17/2014. Poor copy in chart. Unable to read.  Colonoscopy is scheduled 12/10/2022.   Marland Kitchen  History of cholecystectomy.  Concerned with significant weight loss of 19 pounds in 2 months which correlates to onset of symptoms.  Call out to [facility] clinic to see patient sooner as anticipate she would need both  endoscopy and colonoscopy.  She will continue Protonix 40 mg and politely declines changing to Dexilant to see if more helpful for epigastric pain, GERD.  She agrees to trial Pepcid AC over-the-counter nightly.  Pending stool studies, Labs.

## 2022-09-22 NOTE — Progress Notes (Signed)
Subjective:    [name], female    DOB: May 16, 1966, 56 y.o.   MRN: [id number]  CC: [name] [name] is a 56 y.o. female who presents today for an acute visit.    HPI: Accompanied by mother  Here today for diarrhea, abdominal pain , nausea and vomiting x 2 months Diarrhea after 1 minute to one hour eating; It doesn't occur any other time.  Diarrhea which is brown and watery occurs with gluten and also gluten free diet Endorses excessive gas and belching,  bitter taste in her mouth, epigastric burning.  No fever.   She vomited today and then one week ago  No recent antibiotics.   She hasnt collect stool culture.   She feels weak. She has lost 20 lbs.   H/o cholecystectomy , appendectomy    She is taking zofran '4mg'$  for nausea with some relief.  She has been taking bentyl however has had to taken morphine prescribed by Pain Management, Dr Chancy Milroy.   She is taking protonix '40mg'$  BID, pepto bismal prn.  She is allergic to phenergan and compazine.   Seen emergency room 09/12/2022 at [facility] for abdominal pain Urinalysis revealed negative blood, white blood cells WBC 4.4 Crt 0.82 Lipase 39 Celiac screen negative 09/08/22 CT abdomen pelvis obtained.  The gallbladder surgically absent.  No biliary ductal dilatation.  Normal pancreatic contour.  Symmetric renal enhancement.  Right renal cyst.  Uterus is present, no adnexal masses.  No findings of bowel obstruction or acute inflammation.  Colonic diverticulosis without diverticulitis.  No ascites.  No adenopathy.  Degenerative changes noted at the spine  EGD Dr Tiffany Kocher 08/27/2017 which showed gastritis Colonoscopy 12/17/2014. Poor copy in chart. Unable to read.  Colonoscopy is scheduled 12/10/2022  [facility] Consult with Ronney Asters 08/31/2022 regards to acute diarrhea, dysphagia  History of hypothyroidism, lupus, GERD, IBS, migraines depression, parkinsonism  TSH 1.88 one month  ago  [place] ab 04/17/19 right renal cyst 1.9 cm, 1.5cm.   HISTORY:  Past Medical History:  Diagnosis Date   ADHD    Anemia    ANXIETY 06/06/2007   Arthritis    related to lupus   Asthma    BACK PAIN 02/22/2009   Cervical cancer ([place])    LEEP 2005    Chicken pox    Chronic pain    goes to pain management provider   Collagen vascular disease ([place])    Connective tissue disease ([place])    DEPRESSION 06/06/2007   Essential tremor    Fibromyalgia    Fibromyalgia    FREQUENCY, URINARY 07/07/2007   Gastritis    GERD 06/06/2007   Glaucoma    Headache    h/o migraines    History of hiatal hernia    History of kidney stones    HYPOTHYROIDISM 06/06/2007   INTERSTITIAL CYSTITIS 09/11/2010   Lupus ([place]) 2006   Lupus ([place])    Menopause    Migraine    Mitral valve regurgitation    NEPHROLITHIASIS, HX OF 11/05/2010   Neuropathy    Neuropathy    OCD (obsessive compulsive disorder)    Optic neuritis    2005   OSTEOPENIA 10/14/2009   Osteoporosis    OVERACTIVE BLADDER 10/14/2009   PAIN, CHRONIC NEC 06/06/2007   Panniculitis    Parkinson disease 04/03/2019   POLYARTHRITIS 08/19/2007   Raynaud's disease    Sicca syndrome ([place])    UNSPECIFIED OPTIC NEURITIS 11/08/2007   Vaginal prolapse  Dr. Marcelline Mates [facility]    Vasculitis [facility]    WEIGHT GAIN 02/22/2009   Past Surgical History:  Procedure Laterality Date   APPENDECTOMY     CERVICAL BIOPSY  W/ LOOP ELECTRODE EXCISION  2005   CHOLECYSTECTOMY     ESOPHAGOGASTRODUODENOSCOPY (EGD) WITH PROPOFOL N/A 08/27/2017   Procedure: ESOPHAGOGASTRODUODENOSCOPY (EGD) WITH PROPOFOL;  Surgeon: Manya Silvas, MD;  Location: [facility] ENDOSCOPY;  Service: Endoscopy;  Laterality: N/A;   HAND SURGERY     repair of left metatarsal    HARDWARE REMOVAL Right 02/16/2017   Procedure: HARDWARE REMOVAL FROM HIP;  Surgeon: Hessie Knows, MD;  Location: [facility] ORS;  Service: Orthopedics;  Laterality: Right;   JOINT REPLACEMENT     OTHER SURGICAL HISTORY     left 3rd metatarsal  fracture repair 2011    REVISION TOTAL HIP ARTHROPLASTY  06/2009   replacement 2010 then screw and plate removal 3295; right hip   TONSILLECTOMY     TONSILLECTOMY     wrist  ligament repair bilateral     WRIST RECONSTRUCTION     WRIST SURGERY     laceration of right wrist ligaments   wrist surgery     laceration of left wrist surgery    Family History  Problem Relation Age of Onset   Hypertension Mother    Arthritis Mother    Heart disease Mother        afib   Asthma Sister    Asthma Daughter    Arthritis Daughter    Heart disease Daughter        ?heart condition on BB   ADD / ADHD Daughter    Pancreatic cancer Father    Cancer Father        pancreatitic    Diabetes Maternal Grandmother    Heart disease Maternal Grandmother    Arthritis Maternal Grandmother    Hypertension Maternal Grandmother    Dementia Maternal Grandmother    Colon cancer Maternal Uncle 3   Crohn's disease Maternal Aunt    Breast cancer Maternal Aunt 65   Diabetes Maternal Aunt    Breast cancer Cousin 51       maternal   Cancer Cousin        m cousin breat cancer s/p removal both breasts     Allergies: Bee venom, Fish allergy, Latex, Prasterone, Shellfish allergy, Shellfish-derived products, Compazine [prochlorperazine edisylate], Dhea [nutritional supplements], Dilaudid [hydromorphone hcl], Famotidine, Lactose, Lactose intolerance (gi), Other, Prochlorperazine, Promethazine, Promethazine hcl, Reclast [zoledronic acid], Clarithromycin, Clotrimazole, Hydromorphone hcl, and Penicillins Current Outpatient Medications on File Prior to Visit  Medication Sig Dispense Refill   acetaminophen (TYLENOL) 650 MG CR tablet Take 650 mg by mouth every 8 (eight) hours as needed for pain.     albuterol (PROVENTIL) (2.5 MG/3ML) 0.083% nebulizer solution Take 3 mLs (2.5 mg total) by nebulization every 6 (six) hours as needed for wheezing or shortness of breath. 150 mL 12   albuterol (VENTOLIN HFA) 108 (90 Base)  MCG/ACT inhaler Inhale 1-2 puffs into the lungs every 4 (four) hours as needed for wheezing or shortness of breath. 18 g 11   Ascorbic Acid (VITAMIN C) 500 MG CHEW Chew 2,500 mg by mouth daily.     Biotin 5000 MCG CHEW Chew 2 each by mouth daily.     carbidopa-levodopa (SINEMET CR) 50-200 MG tablet Take 1 tablet by mouth 5 (five) times daily.     Cholecalciferol (VITAMIN D3) 2000 units capsule Vitamin D3 2,000 unit capsule  dapsone 25 MG tablet Take by mouth daily.      diclofenac sodium (VOLTAREN) 1 % GEL Apply 2 g topically 4 (four) times daily as needed (for knee & finger pain.).      dicyclomine (BENTYL) 20 MG tablet Take by mouth.     DULoxetine (CYMBALTA) 60 MG capsule Take 60 mg by mouth 2 (two) times daily.     EPINEPHrine 0.3 mg/0.3 mL IJ SOAJ injection Inject 0.3 mg into the muscle as needed for anaphylaxis. 1 each 2   Ferrous Sulfate (IRON PO) Take by mouth.     fluticasone-salmeterol (ADVAIR DISKUS) 250-50 MCG/ACT AEPB Inhale 1 puff into the lungs in the morning and at bedtime. Rinse mouth did not tolerate wixela brand only 60 each 12   folic acid (FOLVITE) 1 MG tablet Take 1 tablet (1 mg total) by mouth daily. 90 tablet 3   hydrocortisone 2.5 % cream Apply topically 2 (two) times daily. Prn under arms 30 g 2   hydroxychloroquine (PLAQUENIL) 200 MG tablet Plaquenil 200 mg tablet  Take 1 tablet twice a day by oral route after meals for 90 days.     ibuprofen (ADVIL,MOTRIN) 800 MG tablet Take 1 tablet (800 mg total) by mouth every 8 (eight) hours as needed. 30 tablet 0   levothyroxine (SYNTHROID) 75 MCG tablet TAKE 1 TABLET BY MOUTH DAILY 30 MINUTES BEFORE BREAKFAST 90 tablet 3   LORazepam (ATIVAN) 2 MG tablet Take 1-2 mg by mouth See admin instructions. 1 MG DAILY AS NEEDED FOR ANXIETY & SCHEDULED AT BEDTIME EACH NIGHT     methocarbamol (ROBAXIN) 750 MG tablet Take 1 tablet (750 mg total) by mouth every 6 (six) hours as needed. 120 tablet 5   montelukast (SINGULAIR) 10 MG tablet  TAKE 1 TABLET BY MOUTH EVERY NIGHT AT BEDTIME 90 tablet 3   morphine (MSIR) 15 MG tablet SMARTSIG:1 Tablet(s) By Mouth Every 12 Hours PRN     Multiple Vitamins-Minerals (MULTI-VITAMIN GUMMIES PO) Multi Vitamin  GUMMIES     mupirocin ointment (BACTROBAN) 2 % Apply 1 Application topically 2 (two) times daily. Prn burns right chest and lower chest x 10-14 days 30 g 0   neomycin-polymyxin-hydrocortisone (CORTISPORIN) OTIC solution Place 3 drops into the right ear 4 (four) times daily. 10 mL 0   ondansetron (ZOFRAN) 4 MG tablet Take 1 tablet (4 mg total) by mouth every 8 (eight) hours as needed for nausea or vomiting. 40 tablet 2   pantoprazole (PROTONIX) 40 MG tablet Take 40 mg by mouth 2 (two) times daily.     PARoxetine (PAXIL) 10 MG tablet Take 10 mg by mouth at bedtime.     Polyvinyl Alcohol-Povidone (REFRESH OP) Place 1 drop into both eyes 2 (two) times daily.     Probiotic Product (PROBIOTIC PO) Probiotic 10 billion cell capsule  daily     rizatriptan (MAXALT) 10 MG tablet Take 1 tablet (10 mg total) by mouth as needed for migraine. May repeat in 2 hours if needed. Max dose 20 mg in 1 day 10 tablet 5   Sodium Chloride 3 % AERS Saline Nasal Mist 3 %  spray nose deeply every hour or two and blow nose     topiramate (TOPAMAX) 50 MG tablet Take 150 mg by mouth in the morning and at bedtime.     ARIPiprazole (ABILIFY) 20 MG tablet Take 20 mg by mouth daily. (Patient not taking: Reported on 08/11/2022)     aspirin 325 MG tablet Take 1 tablet (325  mg total) by mouth daily. (Patient not taking: Reported on 08/11/2022) 30 tablet 0   No current facility-administered medications on file prior to visit.    Social History   Tobacco Use   Smoking status: Never   Smokeless tobacco: Never  Vaping Use   Vaping Use: Never used  Substance Use Topics   Alcohol use: No   Drug use: No    Review of Systems  Constitutional:  Positive for unexpected weight change. Negative for chills and fever.   Respiratory:  Negative for cough.   Cardiovascular:  Negative for chest pain and palpitations.  Gastrointestinal:  Positive for abdominal pain, diarrhea and nausea. Negative for constipation and vomiting.  Genitourinary:  Negative for dysuria.      Objective:    BP 138/88 (BP Location: Left Arm, Patient Position: Sitting, Cuff Size: Normal)   Pulse 100   Temp 98 F (36.7 C) (Oral)   Ht '5\' 10"'$  (1.778 m)   Wt 203 lb 9.6 oz (92.4 kg)   LMP 12/15/2015 Comment: neg urine test   SpO2 95%   BMI 29.21 kg/m  Wt Readings from Last 3 Encounters:  09/22/22 203 lb 9.6 oz (92.4 kg)  08/11/22 222 lb 12.8 oz (101.1 kg)  07/23/22 222 lb 3.2 oz (100.8 kg)     Physical Exam Vitals reviewed.  Constitutional:      Appearance: Normal appearance. She is well-developed.  Eyes:     Conjunctiva/sclera: Conjunctivae normal.  Cardiovascular:     Rate and Rhythm: Normal rate and regular rhythm.     Pulses: Normal pulses.     Heart sounds: Normal heart sounds.  Pulmonary:     Effort: Pulmonary effort is normal.     Breath sounds: Normal breath sounds. No wheezing, rhonchi or rales.  Abdominal:     General: Bowel sounds are normal. There is no distension.     Palpations: Abdomen is soft. Abdomen is not rigid. There is no fluid wave or mass.     Tenderness: There is generalized abdominal tenderness. There is no right CVA tenderness, left CVA tenderness, guarding or rebound.  Skin:    General: Skin is warm and dry.  Neurological:     Mental Status: She is alert.  Psychiatric:        Speech: Speech normal.        Behavior: Behavior normal.        Thought Content: Thought content normal.        Assessment & Plan:   Problem List Items Addressed This Visit       Genitourinary   Cyst of right kidney    [place] ab 04/17/19 right renal cyst 1.9 cm CT abdomen and pelvis 09/12/22 showed 1.5cm right renal cyst.  Advised renal ultrasound to further characterize cyst to ensure it is simple and does not  warrant further evaluation.  She politely declines at this time.        Other   Diarrhea - Primary    Ongoing nonbloody diarrhea after eating. multiple emergency room visits. Celiac screen negative 09/08/22.Afebrile.  Diffuse abdominal tenderness on exam today.  Reviewed CT abdomen pelvis obtained in ED 09/12/22. EGD Dr Tiffany Kocher 08/27/2017 which showed gastritis Colonoscopy 12/17/2014. Poor copy in chart. Unable to read.  Colonoscopy is scheduled 12/10/2022.   Marland Kitchen  History of cholecystectomy.  Concerned with significant weight loss of 19 pounds in 2 months which correlates to onset of symptoms.  Call out to [facility] clinic to see patient sooner as anticipate  she would need both  endoscopy and colonoscopy.  She will continue Protonix 40 mg and politely declines changing to Dexilant to see if more helpful for epigastric pain, GERD.  She agrees to trial Pepcid AC over-the-counter nightly.  Pending stool studies, Labs.      Relevant Orders   C Difficile Quick Screen w PCR reflex   Stool culture   Calprotectin, Fecal   C-reactive protein   Sedimentation rate   Comprehensive metabolic panel      I am having Shantina Karlene Lineman maintain her diclofenac sodium, LORazepam, ARIPiprazole, Vitamin C, Polyvinyl Alcohol-Povidone (REFRESH OP), aspirin, dapsone, hydroxychloroquine, Vitamin D3, Saline, Probiotic Product (PROBIOTIC PO), Multiple Vitamins-Minerals (MULTI-VITAMIN GUMMIES PO), ibuprofen, DULoxetine, Ferrous Sulfate (IRON PO), acetaminophen, ondansetron, carbidopa-levodopa, hydrocortisone, Biotin, dicyclomine, morphine, pantoprazole, PARoxetine, topiramate, neomycin-polymyxin-hydrocortisone, mupirocin ointment, EPINEPHrine, levothyroxine, montelukast, fluticasone-salmeterol, rizatriptan, albuterol, albuterol, folic acid, and methocarbamol.   No orders of the defined types were placed in this encounter.   Return precautions given.   Risks, benefits, and alternatives of the medications and  treatment plan prescribed today were discussed, and patient expressed understanding.   Education regarding symptom management and diagnosis given to patient on AVS.  Continue to follow with McLean-Scocuzza, Nino Glow, MD for routine health maintenance.   Laretta Karlene Lineman and I agreed with plan.   Mable Paris, FNP

## 2022-09-22 NOTE — Telephone Encounter (Signed)
Call [facility] gastroenterology [place] either speak to Ellis Savage, NP nurse or Dr. Virgina Jock' s nurse  Advise this message  I saw Ms [name] [name] today for nausea, abdominal  pain. She has lost 15 lbs since seen by Thayer Headings.  She has  been to ED twice. CT a/p unrevealing. I have ordered stool studies.   Is it possible to move up her colonoscopy and add on EGD due to poorly controlled GERD?   Does Thayer Headings or Dr Virgina Jock have a follow up in which you could see her in the next week?

## 2022-09-24 ENCOUNTER — Telehealth: Payer: Self-pay | Admitting: Internal Medicine

## 2022-09-24 DIAGNOSIS — G20C Parkinsonism, unspecified: Secondary | ICD-10-CM | POA: Diagnosis not present

## 2022-09-24 DIAGNOSIS — I776 Arteritis, unspecified: Secondary | ICD-10-CM | POA: Diagnosis not present

## 2022-09-24 DIAGNOSIS — F419 Anxiety disorder, unspecified: Secondary | ICD-10-CM | POA: Diagnosis not present

## 2022-09-24 DIAGNOSIS — R1110 Vomiting, unspecified: Secondary | ICD-10-CM | POA: Diagnosis not present

## 2022-09-24 DIAGNOSIS — J45909 Unspecified asthma, uncomplicated: Secondary | ICD-10-CM | POA: Diagnosis not present

## 2022-09-24 DIAGNOSIS — Z885 Allergy status to narcotic agent status: Secondary | ICD-10-CM | POA: Diagnosis not present

## 2022-09-24 DIAGNOSIS — Z881 Allergy status to other antibiotic agents status: Secondary | ICD-10-CM | POA: Diagnosis not present

## 2022-09-24 DIAGNOSIS — Z8541 Personal history of malignant neoplasm of cervix uteri: Secondary | ICD-10-CM | POA: Diagnosis not present

## 2022-09-24 DIAGNOSIS — Z7982 Long term (current) use of aspirin: Secondary | ICD-10-CM | POA: Diagnosis not present

## 2022-09-24 DIAGNOSIS — Z880 Allergy status to penicillin: Secondary | ICD-10-CM | POA: Diagnosis not present

## 2022-09-24 DIAGNOSIS — R1084 Generalized abdominal pain: Secondary | ICD-10-CM | POA: Diagnosis not present

## 2022-09-24 DIAGNOSIS — M329 Systemic lupus erythematosus, unspecified: Secondary | ICD-10-CM | POA: Diagnosis not present

## 2022-09-24 DIAGNOSIS — R109 Unspecified abdominal pain: Secondary | ICD-10-CM | POA: Diagnosis not present

## 2022-09-24 DIAGNOSIS — K589 Irritable bowel syndrome without diarrhea: Secondary | ICD-10-CM | POA: Diagnosis not present

## 2022-09-24 DIAGNOSIS — R197 Diarrhea, unspecified: Secondary | ICD-10-CM | POA: Diagnosis not present

## 2022-09-24 DIAGNOSIS — K219 Gastro-esophageal reflux disease without esophagitis: Secondary | ICD-10-CM | POA: Diagnosis not present

## 2022-09-24 DIAGNOSIS — Z888 Allergy status to other drugs, medicaments and biological substances status: Secondary | ICD-10-CM | POA: Diagnosis not present

## 2022-09-24 DIAGNOSIS — E039 Hypothyroidism, unspecified: Secondary | ICD-10-CM | POA: Diagnosis not present

## 2022-09-24 NOTE — Telephone Encounter (Signed)
Called [place] and spoke to [place], and when i did get otp with her Joycelyn Schmid  begin speaking with her

## 2022-09-24 NOTE — Telephone Encounter (Signed)
Pt called stating she is not feeling better and would like the GI referral

## 2022-09-25 DIAGNOSIS — R109 Unspecified abdominal pain: Secondary | ICD-10-CM | POA: Diagnosis not present

## 2022-09-25 NOTE — Telephone Encounter (Signed)
Call patient Please ask her log on and look at my chart result in my note.  I spoke with Ellis Savage nurse practitioner 2 days ago and she is working to move up colonoscopy and endoscopy.  Please reiterate the importance of returning stool studies to [facility] today  Ask pt to call [facility] if she has not heard from them this morning to inquire of moving appt sooner.   Phone: [phone]

## 2022-09-25 NOTE — Telephone Encounter (Signed)
Spoke to patient and she informed me that she had went to ED on 09/24/22 again for dehydration. Pt stated that she is not feeling any better and still having blowouts of diarrhea. Tried calling [facility] to see if they are able to get her an earlier appt but was unable to leave message for someone to give me a call back

## 2022-09-28 ENCOUNTER — Encounter (INDEPENDENT_AMBULATORY_CARE_PROVIDER_SITE_OTHER): Payer: Self-pay

## 2022-09-28 DIAGNOSIS — R531 Weakness: Secondary | ICD-10-CM | POA: Diagnosis not present

## 2022-09-28 DIAGNOSIS — M329 Systemic lupus erythematosus, unspecified: Secondary | ICD-10-CM | POA: Diagnosis not present

## 2022-09-28 DIAGNOSIS — E039 Hypothyroidism, unspecified: Secondary | ICD-10-CM | POA: Diagnosis not present

## 2022-09-28 DIAGNOSIS — R079 Chest pain, unspecified: Secondary | ICD-10-CM | POA: Diagnosis not present

## 2022-09-28 DIAGNOSIS — R0789 Other chest pain: Secondary | ICD-10-CM | POA: Diagnosis not present

## 2022-09-28 DIAGNOSIS — K580 Irritable bowel syndrome with diarrhea: Secondary | ICD-10-CM | POA: Diagnosis not present

## 2022-09-28 DIAGNOSIS — R200 Anesthesia of skin: Secondary | ICD-10-CM | POA: Diagnosis not present

## 2022-09-28 DIAGNOSIS — Z20822 Contact with and (suspected) exposure to covid-19: Secondary | ICD-10-CM | POA: Diagnosis not present

## 2022-09-28 DIAGNOSIS — R42 Dizziness and giddiness: Secondary | ICD-10-CM | POA: Diagnosis not present

## 2022-09-28 DIAGNOSIS — R5383 Other fatigue: Secondary | ICD-10-CM | POA: Diagnosis not present

## 2022-09-30 ENCOUNTER — Telehealth: Payer: Self-pay

## 2022-09-30 NOTE — Telephone Encounter (Signed)
Please call [facility], GI  EIther Dr Virgina Jock or Ronney Asters  Patient again was emergency room 09/24/22  She has been calling to move up EGD, colonoscopy  What is status?  When you get nurse on phone, please get me.   Concerned about pt.

## 2022-09-30 NOTE — Telephone Encounter (Signed)
Called and got connected with Jeanie. She stated she spoke to pt on yesterday about her Ed visit and that she put pt on a cancellation list.   I then let you speak with nurse as well.

## 2022-09-30 NOTE — Telephone Encounter (Signed)
Jeanie from [facility] to state Laurine Blazer, PA-C, does not have any openings, but they are going to add her to the wait list.  [name] [name] states they are awaiting stool studies to rule out C-diff or any type of bacteria.  [name] [name] states she will call patient to update her.

## 2022-09-30 NOTE — Telephone Encounter (Signed)
Jenate   Call pt  I spoke again with [facility] this morning and they have her on cancellation list  I am increasingly concerned as she has been in ED twice in the last 4 days  I have placed urgent referral to another GI office, seeking first available appt  Please ask her to return stool studies     Rasheedah, please see referral , who can she ?   We can try [place], [facility] and [facility]

## 2022-10-01 DIAGNOSIS — K219 Gastro-esophageal reflux disease without esophagitis: Secondary | ICD-10-CM | POA: Diagnosis not present

## 2022-10-01 DIAGNOSIS — R1310 Dysphagia, unspecified: Secondary | ICD-10-CM | POA: Diagnosis not present

## 2022-10-01 DIAGNOSIS — R110 Nausea: Secondary | ICD-10-CM | POA: Diagnosis not present

## 2022-10-01 DIAGNOSIS — R197 Diarrhea, unspecified: Secondary | ICD-10-CM | POA: Diagnosis not present

## 2022-10-02 NOTE — Telephone Encounter (Signed)
Spoke with pt and they have her scheduled for 10/16/22

## 2022-10-02 NOTE — Telephone Encounter (Signed)
Called pt and spoke to her to inquire about stool sample and she informed me that they had moved her appt up to 10/16/22.

## 2022-10-13 DIAGNOSIS — F3181 Bipolar II disorder: Secondary | ICD-10-CM | POA: Diagnosis not present

## 2022-10-16 ENCOUNTER — Ambulatory Visit: Payer: Medicare Other | Admitting: Anesthesiology

## 2022-10-16 ENCOUNTER — Encounter: Payer: Self-pay | Admitting: Gastroenterology

## 2022-10-16 ENCOUNTER — Encounter
Admission: RE | Disposition: A | Discharge status: Home / Self Care | Payer: Self-pay | Source: Home / Self Care | Attending: Gastroenterology

## 2022-10-16 ENCOUNTER — Ambulatory Visit
Admission: RE | Admit: 2022-10-16 | Discharge: 2022-10-16 | Disposition: A | Discharge status: Home / Self Care | Payer: Medicare Other | Attending: Gastroenterology | Admitting: Gastroenterology

## 2022-10-16 DIAGNOSIS — R109 Unspecified abdominal pain: Secondary | ICD-10-CM | POA: Diagnosis not present

## 2022-10-16 DIAGNOSIS — Z800 Family history of malignant neoplasm of digestive organs: Secondary | ICD-10-CM | POA: Diagnosis not present

## 2022-10-16 DIAGNOSIS — Z9049 Acquired absence of other specified parts of digestive tract: Secondary | ICD-10-CM | POA: Diagnosis not present

## 2022-10-16 DIAGNOSIS — K2970 Gastritis, unspecified, without bleeding: Secondary | ICD-10-CM | POA: Diagnosis not present

## 2022-10-16 DIAGNOSIS — K319 Disease of stomach and duodenum, unspecified: Secondary | ICD-10-CM | POA: Insufficient documentation

## 2022-10-16 DIAGNOSIS — R519 Headache, unspecified: Secondary | ICD-10-CM | POA: Insufficient documentation

## 2022-10-16 DIAGNOSIS — M329 Systemic lupus erythematosus, unspecified: Secondary | ICD-10-CM | POA: Diagnosis not present

## 2022-10-16 DIAGNOSIS — Z8379 Family history of other diseases of the digestive system: Secondary | ICD-10-CM | POA: Diagnosis not present

## 2022-10-16 DIAGNOSIS — R1084 Generalized abdominal pain: Secondary | ICD-10-CM | POA: Diagnosis not present

## 2022-10-16 DIAGNOSIS — G20A1 Parkinson's disease without dyskinesia, without mention of fluctuations: Secondary | ICD-10-CM | POA: Diagnosis not present

## 2022-10-16 DIAGNOSIS — K529 Noninfective gastroenteritis and colitis, unspecified: Secondary | ICD-10-CM | POA: Insufficient documentation

## 2022-10-16 DIAGNOSIS — K219 Gastro-esophageal reflux disease without esophagitis: Secondary | ICD-10-CM | POA: Insufficient documentation

## 2022-10-16 DIAGNOSIS — K3189 Other diseases of stomach and duodenum: Secondary | ICD-10-CM | POA: Diagnosis not present

## 2022-10-16 DIAGNOSIS — E039 Hypothyroidism, unspecified: Secondary | ICD-10-CM | POA: Diagnosis not present

## 2022-10-16 DIAGNOSIS — K640 First degree hemorrhoids: Secondary | ICD-10-CM | POA: Insufficient documentation

## 2022-10-16 DIAGNOSIS — R197 Diarrhea, unspecified: Secondary | ICD-10-CM | POA: Diagnosis not present

## 2022-10-16 DIAGNOSIS — K449 Diaphragmatic hernia without obstruction or gangrene: Secondary | ICD-10-CM | POA: Diagnosis not present

## 2022-10-16 DIAGNOSIS — R110 Nausea: Secondary | ICD-10-CM | POA: Diagnosis not present

## 2022-10-16 DIAGNOSIS — K649 Unspecified hemorrhoids: Secondary | ICD-10-CM | POA: Diagnosis not present

## 2022-10-16 DIAGNOSIS — R634 Abnormal weight loss: Secondary | ICD-10-CM | POA: Diagnosis not present

## 2022-10-16 HISTORY — PX: ESOPHAGOGASTRODUODENOSCOPY: SHX5428

## 2022-10-16 HISTORY — PX: COLONOSCOPY: SHX5424

## 2022-10-16 SURGERY — COLONOSCOPY
Anesthesia: General

## 2022-10-16 MED ORDER — SODIUM CHLORIDE 0.9 % IV SOLN: INTRAVENOUS | Status: DC

## 2022-10-16 MED ORDER — PROPOFOL 500 MG/50ML IV EMUL
INTRAVENOUS | Status: DC | PRN
  Administered 2022-10-16: 160 ug/kg/min via INTRAVENOUS
  Administered 2022-10-16: 50 mg via INTRAVENOUS

## 2022-10-16 MED ORDER — PROPOFOL 10 MG/ML IV BOLUS
INTRAVENOUS | Status: AC
  Filled 2022-10-16: qty 40

## 2022-10-16 NOTE — Interval H&P Note (Signed)
History and Physical Interval Note:  10/16/2022 11:36 AM  [name] [name]  has presented today for surgery, with the diagnosis of Weight loss (R63.4) Abdominal pain, unspecified abdominal location (R10.9) Diarrhea, unspecified type (R19.7) Nausea (R11.0).  The various methods of treatment have been discussed with the patient and family. After consideration of risks, benefits and other options for treatment, the patient has consented to  Procedure(s): COLONOSCOPY (N/A) ESOPHAGOGASTRODUODENOSCOPY (EGD) (N/A) as a surgical intervention.  The patient's history has been reviewed, patient examined, no change in status, stable for surgery.  I have reviewed the patient's chart and labs.  Questions were answered to the patient's satisfaction.     Lesly Rubenstein  Ok to proceed with EGD/Colonoscopy

## 2022-10-16 NOTE — H&P (Signed)
Outpatient short stay form Pre-procedure 10/16/2022  Lesly Rubenstein, MD  Primary Physician: McLean-Scocuzza, Nino Glow, MD  Reason for visit:  Chronic Diarrhea  History of present illness:    56 y/o lady with history of multiple medical problems including hypothyroidism,  Parkinson's, and lupus here for EGD/Colonoscopy for chronic diarrhea and abdominal pain with multiple ED visits with unrevealing work-up including multiple CT scans. No blood thinners. History of cholecystectomy and appendectomy. Family history of celiac disease. Grandparents with colon cancer.    Current Facility-Administered Medications:    0.9 %  sodium chloride infusion, , Intravenous, Continuous, Samarrah Tranchina, Hilton Cork, MD  Medications Prior to Admission  Medication Sig Dispense Refill Last Dose   Ascorbic Acid (VITAMIN C) 500 MG CHEW Chew 2,500 mg by mouth daily.   Past Week   Biotin 5000 MCG CHEW Chew 2 each by mouth daily.   Past Week   carbidopa-levodopa (SINEMET CR) 50-200 MG tablet Take 1 tablet by mouth 5 (five) times daily.   Past Week   Cholecalciferol (VITAMIN D3) 2000 units capsule Vitamin D3 2,000 unit capsule   Past Week   dapsone 25 MG tablet Take by mouth daily.    Past Week   diclofenac sodium (VOLTAREN) 1 % GEL Apply 2 g topically 4 (four) times daily as needed (for knee & finger pain.).    Past Week   dicyclomine (BENTYL) 20 MG tablet Take by mouth.   Past Week   DULoxetine (CYMBALTA) 60 MG capsule Take 60 mg by mouth 2 (two) times daily.   Past Week   Ferrous Sulfate (IRON PO) Take by mouth.   Past Week   fluticasone-salmeterol (ADVAIR DISKUS) 250-50 MCG/ACT AEPB Inhale 1 puff into the lungs in the morning and at bedtime. Rinse mouth did not tolerate wixela brand only 60 each 12 Past Week   folic acid (FOLVITE) 1 MG tablet Take 1 tablet (1 mg total) by mouth daily. 90 tablet 3 Past Week   hydrocortisone 2.5 % cream Apply topically 2 (two) times daily. Prn under arms 30 g 2 Past Week    hydroxychloroquine (PLAQUENIL) 200 MG tablet Plaquenil 200 mg tablet  Take 1 tablet twice a day by oral route after meals for 90 days.   Past Month   levothyroxine (SYNTHROID) 75 MCG tablet TAKE 1 TABLET BY MOUTH DAILY 30 MINUTES BEFORE BREAKFAST 90 tablet 3 10/15/2022   LORazepam (ATIVAN) 2 MG tablet Take 1-2 mg by mouth See admin instructions. 1 MG DAILY AS NEEDED FOR ANXIETY & SCHEDULED AT BEDTIME EACH NIGHT   Past Week   montelukast (SINGULAIR) 10 MG tablet TAKE 1 TABLET BY MOUTH EVERY NIGHT AT BEDTIME 90 tablet 3 Past Week   morphine (MSIR) 15 MG tablet SMARTSIG:1 Tablet(s) By Mouth Every 12 Hours PRN   Past Week   Multiple Vitamins-Minerals (MULTI-VITAMIN GUMMIES PO) Multi Vitamin  GUMMIES   Past Week   mupirocin ointment (BACTROBAN) 2 % Apply 1 Application topically 2 (two) times daily. Prn burns right chest and lower chest x 10-14 days 30 g 0 Past Week   neomycin-polymyxin-hydrocortisone (CORTISPORIN) OTIC solution Place 3 drops into the right ear 4 (four) times daily. 10 mL 0 Past Week   ondansetron (ZOFRAN) 4 MG tablet Take 1 tablet (4 mg total) by mouth every 8 (eight) hours as needed for nausea or vomiting. 40 tablet 2 Past Week   pantoprazole (PROTONIX) 40 MG tablet Take 40 mg by mouth 2 (two) times daily.   10/15/2022   PARoxetine (  PAXIL) 10 MG tablet Take 10 mg by mouth at bedtime.   Past Week   Polyvinyl Alcohol-Povidone (REFRESH OP) Place 1 drop into both eyes 2 (two) times daily.   Past Week   Probiotic Product (PROBIOTIC PO) Probiotic 10 billion cell capsule  daily   Past Week   acetaminophen (TYLENOL) 650 MG CR tablet Take 650 mg by mouth every 8 (eight) hours as needed for pain.    at prn   albuterol (PROVENTIL) (2.5 MG/3ML) 0.083% nebulizer solution Take 3 mLs (2.5 mg total) by nebulization every 6 (six) hours as needed for wheezing or shortness of breath. 150 mL 12  at prn   albuterol (VENTOLIN HFA) 108 (90 Base) MCG/ACT inhaler Inhale 1-2 puffs into the lungs every 4 (four)  hours as needed for wheezing or shortness of breath. 18 g 11    ARIPiprazole (ABILIFY) 20 MG tablet Take 20 mg by mouth daily. (Patient not taking: Reported on 08/11/2022)      aspirin 325 MG tablet Take 1 tablet (325 mg total) by mouth daily. (Patient not taking: Reported on 08/11/2022) 30 tablet 0    EPINEPHrine 0.3 mg/0.3 mL IJ SOAJ injection Inject 0.3 mg into the muscle as needed for anaphylaxis. 1 each 2  at prn   ibuprofen (ADVIL,MOTRIN) 800 MG tablet Take 1 tablet (800 mg total) by mouth every 8 (eight) hours as needed. 30 tablet 0  at prn   methocarbamol (ROBAXIN) 750 MG tablet Take 1 tablet (750 mg total) by mouth every 6 (six) hours as needed. 120 tablet 5  at prn   rizatriptan (MAXALT) 10 MG tablet Take 1 tablet (10 mg total) by mouth as needed for migraine. May repeat in 2 hours if needed. Max dose 20 mg in 1 day 10 tablet 5  at prn   Sodium Chloride 3 % AERS Saline Nasal Mist 3 %  spray nose deeply every hour or two and blow nose    at prn   topiramate (TOPAMAX) 50 MG tablet Take 150 mg by mouth in the morning and at bedtime.    at prn     Allergies  Allergen Reactions   Bee Venom Anaphylaxis, Itching and Swelling    Affected area Affected area Affected area Affected area    Fish Allergy Hives, Swelling and Rash    Facial swelling  Facial swelling  Facial swelling Facial swelling  Facial swelling    Latex Anaphylaxis, Rash and Shortness Of Breath    Rash  Rash     Prasterone Other (See Comments) and Nausea And Vomiting    rash Other reaction(s): Headache Headaches.   Shellfish Allergy Hives, Other (See Comments), Rash and Swelling    Facial swelling Uncoded Allergy. Allergen: seafood Uncoded Allergy. Allergen: CATS, Other Reaction: itch, wheezing Uncoded Allergy. Allergen: COMPAZINE, Other Reaction: tremors Facial swelling Facial swelling Uncoded Allergy. Allergen: seafood Uncoded Allergy. Allergen: CATS, Other Reaction: itch, wheezing Uncoded Allergy.  Allergen: COMPAZINE, Other Reaction: tremors Facial swelling Uncoded Allergy. Allergen: seafood Uncoded Allergy. Allergen: CATS, Other Reaction: itch, wheezing Uncoded Allergy. Allergen: COMPAZINE, Other Reaction: tremors Facial swelling Facial swelling Uncoded Allergy. Allergen: seafood Uncoded Allergy. Allergen: CATS, Other Reaction: itch, wheezing Uncoded Allergy. Allergen: COMPAZINE, Other Reaction: tremors Facial swelling Facial swelling Uncoded Allergy. Allergen: seafood Uncoded Allergy. Allergen: CATS, Other Reaction: itch, wheezing Uncoded Allergy. Allergen: COMPAZINE, Other Reaction: tremors    Shellfish-Derived Products Hives, Other (See Comments), Rash and Swelling    Facial swelling Uncoded Allergy. Allergen: seafood Uncoded Allergy. Allergen:  CATS, Other Reaction: itch, wheezing Uncoded Allergy. Allergen: COMPAZINE, Other Reaction: tremors Facial swelling Facial swelling Uncoded Allergy. Allergen: seafood Uncoded Allergy. Allergen: CATS, Other Reaction: itch, wheezing Uncoded Allergy. Allergen: COMPAZINE, Other Reaction: tremors Facial swelling Uncoded Allergy. Allergen: seafood Uncoded Allergy. Allergen: CATS, Other Reaction: itch, wheezing Uncoded Allergy. Allergen: COMPAZINE, Other Reaction: tremors Facial swelling   Compazine [Prochlorperazine Edisylate] Other (See Comments)    tremors   Dhea [Nutritional Supplements] Other (See Comments)    Headaches.   Dilaudid [Hydromorphone Hcl]     ? reaction   Famotidine Other (See Comments) and Diarrhea    Other reaction(s): Headache Gas   Lactose    Lactose Intolerance (Gi)    Other Other (See Comments)    Other reaction(s): Unknown    Prochlorperazine     Other reaction(s): Other (See Comments) ticks   Promethazine     Other reaction(s): Other (See Comments) Ticks   Promethazine Hcl Other (See Comments)    CNS disorder   Reclast [Zoledronic Acid]     Weakness could not move limbs, fatigue, increase sleep    Clarithromycin Hives and Rash   Clotrimazole Swelling and Rash   Hydromorphone Hcl Rash and Hives    "Rash all over"   Penicillins Rash and Other (See Comments)    Has patient had a PCN reaction causing immediate rash, facial/tongue/throat swelling, SOB or lightheadedness with hypotension:No Has patient had a PCN reaction causing severe rash involving mucus membranes or skin necrosis:No Has patient had a PCN reaction that required hospitalization:No Has patient had a PCN reaction occurring within the last 10 years:No If all of the above answers are "NO", then may proceed with Cephalosporin use.     Past Medical History:  Diagnosis Date   ADHD    Anemia    ANXIETY 06/06/2007   Arthritis    related to lupus   Asthma    BACK PAIN 02/22/2009   Cervical cancer ([place])    LEEP 2005    Chicken pox    Chronic pain    goes to pain management provider   Collagen vascular disease ([place])    Connective tissue disease ([place])    DEPRESSION 06/06/2007   Essential tremor    Fibromyalgia    Fibromyalgia    FREQUENCY, URINARY 07/07/2007   Gastritis    GERD 06/06/2007   Glaucoma    Headache    h/o migraines    History of hiatal hernia    History of kidney stones    HYPOTHYROIDISM 06/06/2007   INTERSTITIAL CYSTITIS 09/11/2010   Lupus ([place]) 2006   Lupus ([place])    Menopause    Migraine    Mitral valve regurgitation    NEPHROLITHIASIS, HX OF 11/05/2010   Neuropathy    Neuropathy    OCD (obsessive compulsive disorder)    Optic neuritis    2005   OSTEOPENIA 10/14/2009   Osteoporosis    OVERACTIVE BLADDER 10/14/2009   PAIN, CHRONIC NEC 06/06/2007   Panniculitis    Parkinson disease 04/03/2019   Parkinson disease    POLYARTHRITIS 08/19/2007   Raynaud's disease    Sicca syndrome ([place])    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 Marcelline Mates [facility]    Vasculitis [facility])    WEIGHT GAIN 02/22/2009    Review of systems:  Otherwise negative.    Physical  Exam  Gen: Alert, oriented. Appears stated age.  HEENT: PERRLA. Lungs: No respiratory distress CV: RRR Abd: soft, benign, no  masses Ext: No edema    Planned procedures: Proceed with EGD/colonoscopy. The patient understands the nature of the planned procedure, indications, risks, alternatives and potential complications including but not limited to bleeding, infection, perforation, damage to internal organs and possible oversedation/side effects from anesthesia. The patient agrees and gives consent to proceed.  Please refer to procedure notes for findings, recommendations and patient disposition/instructions.     Lesly Rubenstein, MD [facility]

## 2022-10-16 NOTE — Anesthesia Postprocedure Evaluation (Signed)
Anesthesia Post Note  Patient: [name] [name]  Procedure(s) Performed: COLONOSCOPY ESOPHAGOGASTRODUODENOSCOPY (EGD)  Patient location during evaluation: Endoscopy Anesthesia Type: General Level of consciousness: awake and alert Pain management: pain level controlled Vital Signs Assessment: post-procedure vital signs reviewed and stable Respiratory status: spontaneous breathing, nonlabored ventilation, respiratory function stable and patient connected to nasal cannula oxygen Cardiovascular status: blood pressure returned to baseline and stable Postop Assessment: no apparent nausea or vomiting Anesthetic complications: no   No notable events documented.   Last Vitals:  Vitals:   10/16/22 1242 10/16/22 1252  BP: 120/84 101/64  Pulse: 75 75  Resp: 15 16  Temp:    SpO2: 99% 99%    Last Pain:  Vitals:   10/16/22 1252  TempSrc:   PainSc: 0-No pain                 Precious Haws Vignesh Willert

## 2022-10-16 NOTE — Op Note (Signed)
[facility] Patient Name: [name] [name] Procedure Date: 10/16/2022 11:28 AM MRN: [id number] Account #: [id number] Date of Birth: 1966/08/01 Admit Type: Outpatient Age: 56 Room: [facility] ENDO ROOM 3 Gender: Female Note Status: Finalized Instrument Name: [name] [id number] Procedure:             Colonoscopy Indications:           Chronic diarrhea Providers:             Andrey Farmer MD, MD Referring MD:          Nino Glow Mclean-Scocuzza MD, MD (Referring MD) Medicines:             Monitored Anesthesia Care Complications:         No immediate complications. Estimated blood loss:                         Minimal. Procedure:             Pre-Anesthesia Assessment:                        - Prior to the procedure, a History and Physical was                         performed, and patient medications and allergies were                         reviewed. The patient is competent. The risks and                         benefits of the procedure and the sedation options and                         risks were discussed with the patient. All questions                         were answered and informed consent was obtained.                         Patient identification and proposed procedure were                         verified by the physician, the nurse, the                         anesthesiologist, the anesthetist and the technician                         in the endoscopy suite. Mental Status Examination:                         alert and oriented. Airway Examination: normal                         oropharyngeal airway and neck mobility. Respiratory                         Examination: clear to auscultation. CV Examination:  normal. Prophylactic Antibiotics: The patient does not                         require prophylactic antibiotics. Prior                         Anticoagulants: The patient has taken no anticoagulant                          or antiplatelet agents. ASA Grade Assessment: III - A                         patient with severe systemic disease. After reviewing                         the risks and benefits, the patient was deemed in                         satisfactory condition to undergo the procedure. The                         anesthesia plan was to use monitored anesthesia care                         (MAC). Immediately prior to administration of                         medications, the patient was re-assessed for adequacy                         to receive sedatives. The heart rate, respiratory                         rate, oxygen saturations, blood pressure, adequacy of                         pulmonary ventilation, and response to care were                         monitored throughout the procedure. The physical                         status of the patient was re-assessed after the                         procedure.                        After obtaining informed consent, the colonoscope was                         passed under direct vision. Throughout the procedure,                         the patient's blood pressure, pulse, and oxygen                         saturations were monitored continuously. The  Colonoscope was introduced through the anus and                         advanced to the the terminal ileum. The colonoscopy                         was performed without difficulty. The patient                         tolerated the procedure well. The quality of the bowel                         preparation was good. The terminal ileum, ileocecal                         valve, appendiceal orifice, and rectum were                         photographed. Findings:      The perianal and digital rectal examinations were normal.      The terminal ileum appeared normal.      Normal mucosa was found in the entire colon. Biopsies for histology were       taken with a cold  forceps from the entire colon for evaluation of       microscopic colitis. Estimated blood loss was minimal.      Internal hemorrhoids were found during retroflexion. The hemorrhoids       were Grade I (internal hemorrhoids that do not prolapse).      The exam was otherwise without abnormality on direct and retroflexion       views. Impression:            - The examined portion of the ileum was normal.                        - Normal mucosa in the entire examined colon. Biopsied.                        - Internal hemorrhoids.                        - The examination was otherwise normal on direct and                         retroflexion views. Recommendation:        - Discharge patient to home.                        - Resume previous diet.                        - Continue present medications.                        - Await pathology results.                        - Return to referring physician as previously                         scheduled. Procedure Code(s):     ---  Professional ---                        [phone], Colonoscopy, flexible; with biopsy, single or                         multiple Diagnosis Code(s):     --- Professional ---                        K64.0, First degree hemorrhoids                        K52.9, Noninfective gastroenteritis and colitis,                         unspecified CPT copyright 2022 American Medical Association. All rights reserved. The codes documented in this report are preliminary and upon coder review may  be revised to meet current compliance requirements. Andrey Farmer MD, MD 10/16/2022 12:35:54 PM Number of Addenda: 0 Note Initiated On: 10/16/2022 11:28 AM Scope Withdrawal Time: 0 hours 9 minutes 34 seconds  Total Procedure Duration: 0 hours 15 minutes 24 seconds  Estimated Blood Loss:  Estimated blood loss was minimal.      [facility]

## 2022-10-16 NOTE — Op Note (Signed)
[facility] Patient Name: [name] [name] Procedure Date: 10/16/2022 11:29 AM MRN: [id number] Account #: [id number] Date of Birth: [birth year] Admit Type: Outpatient Age: 56 Room: [facility] ENDO ROOM 3 Gender: Female Note Status: Finalized Instrument Name: Michaelle Birks [id number] Procedure:             Upper GI endoscopy Indications:           Generalized abdominal pain, Gastro-esophageal reflux                         disease Providers:             Andrey Farmer MD, MD Referring MD:          Nino Glow Mclean-Scocuzza MD, MD (Referring MD) Medicines:             Monitored Anesthesia Care Complications:         No immediate complications. Estimated blood loss:                         Minimal. Procedure:             Pre-Anesthesia Assessment:                        - Prior to the procedure, a History and Physical was                         performed, and patient medications and allergies were                         reviewed. The patient is competent. The risks and                         benefits of the procedure and the sedation options and                         risks were discussed with the patient. All questions                         were answered and informed consent was obtained.                         Patient identification and proposed procedure were                         verified by the physician, the nurse, the                         anesthesiologist, the anesthetist and the technician                         in the endoscopy suite. Mental Status Examination:                         alert and oriented. Airway Examination: normal                         oropharyngeal airway and neck mobility. Respiratory  Examination: clear to auscultation. CV Examination:                         normal. Prophylactic Antibiotics: The patient does not                         require prophylactic antibiotics. Prior                          Anticoagulants: The patient has taken no anticoagulant                         or antiplatelet agents. ASA Grade Assessment: III - A                         patient with severe systemic disease. After reviewing                         the risks and benefits, the patient was deemed in                         satisfactory condition to undergo the procedure. The                         anesthesia plan was to use monitored anesthesia care                         (MAC). Immediately prior to administration of                         medications, the patient was re-assessed for adequacy                         to receive sedatives. The heart rate, respiratory                         rate, oxygen saturations, blood pressure, adequacy of                         pulmonary ventilation, and response to care were                         monitored throughout the procedure. The physical                         status of the patient was re-assessed after the                         procedure.                        After obtaining informed consent, the endoscope was                         passed under direct vision. Throughout the procedure,                         the patient's blood pressure, pulse, and oxygen  saturations were monitored continuously. The Endoscope                         was introduced through the mouth, and advanced to the                         second part of duodenum. The upper GI endoscopy was                         accomplished without difficulty. The patient tolerated                         the procedure well. Findings:      A small hiatal hernia was present.      The exam of the esophagus was otherwise normal.      Patchy minimal inflammation characterized by erythema was found in the       gastric antrum. Biopsies were taken with a cold forceps for Helicobacter       pylori testing. Estimated blood loss was minimal.      The examined  duodenum was normal. Biopsies for histology were taken with       a cold forceps for evaluation of celiac disease. Estimated blood loss       was minimal. Impression:            - Small hiatal hernia.                        - Gastritis. Biopsied.                        - Normal examined duodenum. Biopsied. Recommendation:        - Discharge patient to home.                        - Resume previous diet.                        - Continue present medications.                        - Await pathology results.                        - Return to referring physician as previously                         scheduled. Procedure Code(s):     --- Professional ---                        [phone], Esophagogastroduodenoscopy, flexible,                         transoral; with biopsy, single or multiple Diagnosis Code(s):     --- Professional ---                        K44.9, Diaphragmatic hernia without obstruction or                         gangrene  K29.70, Gastritis, unspecified, without bleeding                        R10.84, Generalized abdominal pain                        K21.9, Gastro-esophageal reflux disease without                         esophagitis CPT copyright 2022 American Medical Association. All rights reserved. The codes documented in this report are preliminary and upon coder review may  be revised to meet current compliance requirements. Andrey Farmer MD, MD 10/16/2022 12:26:04 PM Number of Addenda: 0 Note Initiated On: 10/16/2022 11:29 AM Estimated Blood Loss:  Estimated blood loss was minimal.      [facility]

## 2022-10-16 NOTE — Anesthesia Preprocedure Evaluation (Signed)
Anesthesia Evaluation  Patient identified by MRN, date of birth, ID band Patient awake    Reviewed: Allergy & Precautions, NPO status , Patient's Chart, lab work & pertinent test results  History of Anesthesia Complications Negative for: history of anesthetic complications  Airway Mallampati: III  TM Distance: >3 FB Neck ROM: full    Dental  (+) Chipped   Pulmonary neg shortness of breath, asthma    Pulmonary exam normal        Cardiovascular Exercise Tolerance: Good (-) angina (-) Past MI negative cardio ROS Normal cardiovascular exam     Neuro/Psych  Headaches  Neuromuscular disease  negative psych ROS   GI/Hepatic Neg liver ROS, hiatal hernia,GERD  Controlled,,  Endo/Other  Hypothyroidism    Renal/GU Renal disease  negative genitourinary   Musculoskeletal   Abdominal   Peds  Hematology negative hematology ROS (+)   Anesthesia Other Findings Past Medical History: No date: ADHD No date: Anemia 06/06/2007: ANXIETY No date: Arthritis     Comment:  related to lupus No date: Asthma 02/22/2009: BACK PAIN No date: Cervical cancer ([place])     Comment:  LEEP 2005  No date: Chicken pox No date: Chronic pain     Comment:  goes to pain management provider No date: Collagen vascular disease ([place]) No date: Connective tissue disease ([place]) 06/06/2007: DEPRESSION No date: Essential tremor No date: Fibromyalgia No date: Fibromyalgia 07/07/2007: FREQUENCY, URINARY No date: Gastritis 06/06/2007: GERD No date: Glaucoma No date: Headache     Comment:  h/o migraines  No date: History of hiatal hernia No date: History of kidney stones 06/06/2007: HYPOTHYROIDISM 09/11/2010: INTERSTITIAL CYSTITIS 2006: Lupus ([place]) No date: Lupus ([place]) No date: Menopause No date: Migraine No date: Mitral valve regurgitation 11/05/2010: NEPHROLITHIASIS, HX OF No date: Neuropathy No date: Neuropathy No date: OCD (obsessive compulsive  disorder) No date: Optic neuritis     Comment:  2005 10/14/2009: OSTEOPENIA No date: Osteoporosis 10/14/2009: OVERACTIVE BLADDER 06/06/2007: PAIN, CHRONIC NEC No date: Panniculitis 04/03/2019: Parkinson disease No date: Parkinson disease 08/19/2007: POLYARTHRITIS No date: Raynaud's disease No date: 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 Marcelline Mates [facility]  No date: Vasculitis ([place]) 02/22/2009: WEIGHT GAIN  Past Surgical History: No date: APPENDECTOMY 2005: CERVICAL BIOPSY  W/ LOOP ELECTRODE EXCISION No date: CHOLECYSTECTOMY 08/27/2017: ESOPHAGOGASTRODUODENOSCOPY (EGD) WITH PROPOFOL; N/A     Comment:  Procedure: ESOPHAGOGASTRODUODENOSCOPY (EGD) WITH               PROPOFOL;  Surgeon: Manya Silvas, MD;  Location:               [facility] ENDOSCOPY;  Service: Endoscopy;  Laterality: N/A; No date: HAND SURGERY     Comment:  repair of left metatarsal  02/16/2017: HARDWARE REMOVAL; Right     Comment:  Procedure: HARDWARE REMOVAL FROM HIP;  Surgeon: Hessie Knows, MD;  Location: [facility] ORS;  Service: Orthopedics;                Laterality: Right; No date: JOINT REPLACEMENT No date: OTHER SURGICAL HISTORY     Comment:  left 3rd metatarsal fracture repair 2011  06/2009: REVISION TOTAL HIP ARTHROPLASTY     Comment:  replacement 2010 then screw and plate removal [id number];               right hip No date: TONSILLECTOMY No date: TONSILLECTOMY  No date: wrist  ligament repair bilateral No date: WRIST RECONSTRUCTION No date: WRIST SURGERY     Comment:  laceration of right wrist ligaments No date: wrist surgery     Comment:  laceration of left wrist surgery   BMI    Body Mass Index: 27.32 kg/m      Reproductive/Obstetrics negative OB ROS                             Anesthesia Physical Anesthesia Plan  ASA: 3  Anesthesia Plan: General   Post-op Pain Management:    Induction:  Intravenous  PONV Risk Score and Plan: Propofol infusion and TIVA  Airway Management Planned: Natural Airway and Nasal Cannula  Additional Equipment:   Intra-op Plan:   Post-operative Plan:   Informed Consent: I have reviewed the patients History and Physical, chart, labs and discussed the procedure including the risks, benefits and alternatives for the proposed anesthesia with the patient or authorized representative who has indicated his/her understanding and acceptance.     Dental Advisory Given  Plan Discussed with: Anesthesiologist, CRNA and Surgeon  Anesthesia Plan Comments: (Patient consented for risks of anesthesia including but not limited to:  - adverse reactions to medications - risk of airway placement if required - damage to eyes, teeth, lips or other oral mucosa - nerve damage due to positioning  - sore throat or hoarseness - Damage to heart, brain, nerves, lungs, other parts of body or loss of life  Patient voiced understanding.)       Anesthesia Quick Evaluation

## 2022-10-16 NOTE — Transfer of Care (Signed)
Immediate Anesthesia Transfer of Care Note  Patient: [name] [name]  Procedure(s) Performed: COLONOSCOPY ESOPHAGOGASTRODUODENOSCOPY (EGD)  Patient Location: PACU  Anesthesia Type:General  Level of Consciousness: awake and drowsy  Airway & Oxygen Therapy: Patient Spontanous Breathing, Patient connected to nasal cannula oxygen, and Patient connected to face mask oxygen  Post-op Assessment: Report given to RN and Post -op Vital signs reviewed and stable  Post vital signs: Reviewed and stable  Last Vitals:  Vitals Value Taken Time  BP    Temp    Pulse    Resp    SpO2      Last Pain:  Vitals:   10/16/22 1120  TempSrc: Temporal  PainSc: 6          Complications: No notable events documented.

## 2022-10-19 ENCOUNTER — Encounter: Payer: Self-pay | Admitting: Family

## 2022-10-19 LAB — SURGICAL PATHOLOGY

## 2022-10-21 NOTE — Telephone Encounter (Signed)
Appt scheduled for Dec 4th 2023 with you

## 2022-10-28 ENCOUNTER — Other Ambulatory Visit: Payer: Self-pay

## 2022-10-28 DIAGNOSIS — M47816 Spondylosis without myelopathy or radiculopathy, lumbar region: Secondary | ICD-10-CM

## 2022-10-28 DIAGNOSIS — M62838 Other muscle spasm: Secondary | ICD-10-CM

## 2022-10-28 DIAGNOSIS — M5134 Other intervertebral disc degeneration, thoracic region: Secondary | ICD-10-CM

## 2022-11-02 ENCOUNTER — Ambulatory Visit (INDEPENDENT_AMBULATORY_CARE_PROVIDER_SITE_OTHER): Payer: Medicare Other | Admitting: Family

## 2022-11-02 ENCOUNTER — Encounter: Payer: Self-pay | Admitting: Family

## 2022-11-02 VITALS — BP 132/80 | HR 85 | Temp 97.6°F | Wt 207.4 lb

## 2022-11-02 DIAGNOSIS — M2550 Pain in unspecified joint: Secondary | ICD-10-CM | POA: Diagnosis not present

## 2022-11-02 DIAGNOSIS — R197 Diarrhea, unspecified: Secondary | ICD-10-CM | POA: Diagnosis not present

## 2022-11-02 DIAGNOSIS — F339 Major depressive disorder, recurrent, unspecified: Secondary | ICD-10-CM

## 2022-11-02 DIAGNOSIS — I872 Venous insufficiency (chronic) (peripheral): Secondary | ICD-10-CM | POA: Diagnosis not present

## 2022-11-02 MED ORDER — DULOXETINE HCL 30 MG PO CPEP: 30.0000 mg | ORAL_CAPSULE | Freq: Every day | ORAL | 1 refills | Status: DC

## 2022-11-02 NOTE — Assessment & Plan Note (Addendum)
Chronic right knee pain.  Previous right knee x-ray revealed mild arthritis.  Unable to see MRI right knee which she reports was obtained this year. Left toe contracture.   We will resume Cymbalta and slowly titrate recheck as it was somewhat helpful for pain.  I placed referral to orthopedics for the evaluation of right knee pain, left foot toe contracture.  Referral to physical therapy for gait, balance, safety evaluation in setting of arthralgia.

## 2022-11-02 NOTE — Assessment & Plan Note (Addendum)
Evidence of stasis dermatitis on exam. Extremities are quite cold, however palpable pedal pulse. Pending ABI. Consider return to follow with vascular after ABI.

## 2022-11-02 NOTE — Assessment & Plan Note (Addendum)
10/16/22 Colonoscopy Dr. Haig Prophet and endoscopy reported unremarkable.  Repeat colonoscopy in 10 years.   Improved however today she had diarrhea.  History of cholecystectomy and [facility] started her on Colestipol 2 gram BID .  Patient will follow-up with GI in regards to diarrhea and I advised her to call their office to make them aware of diarrhea this morning.  Fortunately she has gained weight. Patient will need to limit fat and increase fiber in her diet.  I placed a referral to nutrition for continued support in the setting of diarrhea and weight loss.

## 2022-11-02 NOTE — Progress Notes (Signed)
Subjective:    [name], female    DOB: [birth year], 56 y.o.   MRN: [id number]  CC: [name] [name] is a 56 y.o. female who presents today for follow up.   HPI:  Accompanied by mother  Depression and anxiety-  She had previously been on cymbalta '60mg'$  BID but she has stopped without weaning weeks ago. She stopped Paxil as well She follows with psychiatry, [name] [name] , PA. She missed previous follow up.   Complains of chronic right knee pain.  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 Uncomfortable to walk and difficult to find shoes that are comfortable.        She has seen orthopedist in the past for right knee, Dr [name] [name] 12/25/21.  She taking tylenol arthritis 650 mg, 2 tablets PRN without relief. She takes morphine '15mg'$  with some relief of right knee pain.   Unfortunately, previous orthopedic doesn't accept her insurance and she would need another referral.   She reports MRI right knee obtained with orthopedist ( I cannot see in [place])   3/222 consult with Dr [name] [name] for Left ankle pain , contracture whom recommended Budin splint, possibility of surgical intervention  12/15/21 right knee xray significant for Mild arthritis in knee.   She has seen vascular in the past. She reports that bilateral feet stay cold.  She complains of pain in her calves when walking 12/08/2019, consult with vascular [name] [name] regarding lymphedema, chronic venous insufficiency  and stasis dermatitis. Started her on kenalog ointment.   Diarrhea had improved.   She is compliant with colestipol 2 gram BID which had been working for 5 days then today she had diarrhea .  Yellow watery. She is feeling nauseated.   She is eating bland diet water, carnation protein and chicken soup.   She reports that she has returned stool sample to GI  today.   Colonoscopy Dr. Haig [name] and endoscopy reported unremarkable.  Repeat colonoscopy in 10 years.   Est care Dr [name] [name]  02/09/22  HISTORY:  Past Medical History:  Diagnosis Date   ADHD    Anemia    ANXIETY 06/06/2007   Arthritis    related to lupus   Asthma    BACK PAIN 02/22/2009   Cervical cancer ([place])    LEEP 2005    Chicken pox    Chronic pain    goes to pain management provider   Collagen vascular disease ([place])    Connective tissue disease ([place])    DEPRESSION 06/06/2007   Essential tremor    Fibromyalgia    Fibromyalgia    FREQUENCY, URINARY 07/07/2007   Gastritis    GERD 06/06/2007   Glaucoma    Headache    h/o migraines    History of hiatal hernia    History of kidney stones    HYPOTHYROIDISM 06/06/2007   INTERSTITIAL CYSTITIS 09/11/2010   Lupus ([place]) 2006   Lupus ([place])    Menopause    Migraine    Mitral valve regurgitation    NEPHROLITHIASIS, HX OF 11/05/2010   Neuropathy    Neuropathy    OCD (obsessive compulsive disorder)    Optic neuritis    2005   OSTEOPENIA 10/14/2009   Osteoporosis    OVERACTIVE BLADDER 10/14/2009   PAIN, CHRONIC NEC 06/06/2007   Panniculitis    Parkinson disease 04/03/2019   Parkinson disease    POLYARTHRITIS 08/19/2007   Raynaud's disease    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 Marcelline Mates [facility]    Vasculitis [facility])    WEIGHT GAIN 02/22/2009   Past Surgical History:  Procedure Laterality Date   APPENDECTOMY     CERVICAL BIOPSY  W/ LOOP ELECTRODE EXCISION  2005   CHOLECYSTECTOMY     COLONOSCOPY N/A 10/16/2022   Procedure: COLONOSCOPY;  Surgeon: [name] Rubenstein, MD;  Location: [facility] ENDOSCOPY;  Service: Gastroenterology;  Laterality: N/A;   ESOPHAGOGASTRODUODENOSCOPY N/A 10/16/2022   Procedure: ESOPHAGOGASTRODUODENOSCOPY (EGD);  Surgeon: [name] Rubenstein, MD;  Location: [facility] ENDOSCOPY;  Service: Gastroenterology;  Laterality: N/A;   ESOPHAGOGASTRODUODENOSCOPY (EGD) WITH PROPOFOL N/A 08/27/2017   Procedure: ESOPHAGOGASTRODUODENOSCOPY (EGD) WITH PROPOFOL;  Surgeon: [name] Silvas,  MD;  Location: [facility] ENDOSCOPY;  Service: Endoscopy;  Laterality: N/A;   HAND SURGERY     repair of left metatarsal    HARDWARE REMOVAL Right 02/16/2017   Procedure: HARDWARE REMOVAL FROM HIP;  Surgeon: [name] Knows, MD;  Location: [facility] ORS;  Service: Orthopedics;  Laterality: Right;   JOINT REPLACEMENT     OTHER SURGICAL HISTORY     left 3rd metatarsal fracture repair 2011    REVISION TOTAL HIP ARTHROPLASTY  06/2009   replacement 2010 then screw and plate removal 4315; right hip   TONSILLECTOMY     TONSILLECTOMY     wrist  ligament repair bilateral     WRIST RECONSTRUCTION     WRIST SURGERY     laceration of right wrist ligaments   wrist surgery     laceration of left wrist surgery    Family History  Problem Relation Age of Onset   Hypertension Mother    Arthritis Mother    Heart disease Mother        afib   Asthma Sister    Asthma Daughter    Arthritis Daughter    Heart disease Daughter        ?heart condition on BB   ADD / ADHD Daughter    Pancreatic cancer Father    Cancer Father        pancreatitic    Diabetes Maternal Grandmother    Heart disease Maternal Grandmother    Arthritis Maternal Grandmother    Hypertension Maternal Grandmother    Dementia Maternal Grandmother    Colon cancer Maternal Uncle 7   Crohn's disease Maternal Aunt    Breast cancer Maternal Aunt 65   Diabetes Maternal Aunt    Breast cancer Cousin 75       maternal   Cancer Cousin        m cousin breat cancer s/p removal both breasts     Allergies: Bee venom, Fish allergy, Latex, Prasterone, Shellfish allergy, Shellfish-derived products, Compazine [prochlorperazine edisylate], Dhea [nutritional supplements], Dilaudid [hydromorphone hcl], Famotidine, Lactose, Lactose intolerance (gi), Other, Prochlorperazine, Promethazine, Promethazine hcl, Reclast [zoledronic acid], Clarithromycin, Clotrimazole, Hydromorphone hcl, and Penicillins Current Outpatient Medications on File Prior to Visit  Medication  Sig Dispense Refill   acetaminophen (TYLENOL) 650 MG CR tablet Take 650 mg by mouth every 8 (eight) hours as needed for pain.     albuterol (PROVENTIL) (2.5 MG/3ML) 0.083% nebulizer solution Take 3 mLs (2.5 mg total) by nebulization every 6 (six) hours as needed for wheezing or shortness of breath. 150 mL 12   albuterol (VENTOLIN HFA) 108 (90 Base) MCG/ACT inhaler Inhale 1-2 puffs into the lungs every 4 (four) hours as needed for wheezing or shortness of breath. 18 g 11   Ascorbic Acid (  VITAMIN C) 500 MG CHEW Chew 2,500 mg by mouth daily.     Biotin 5000 MCG CHEW Chew 2 each by mouth daily.     carbidopa-levodopa (SINEMET CR) 50-200 MG tablet Take 1 tablet by mouth 5 (five) times daily.     Cholecalciferol (VITAMIN D3) 2000 units capsule Vitamin D3 2,000 unit capsule     colestipol (COLESTID) 1 g tablet Take 2 tablets (2 g total) by mouth 2 (two) times daily Take before meals. Separate from other medications. Other drugs should be administered at least 1 hour before or 4 hours after colestipol.     dapsone 25 MG tablet Take by mouth daily.      diclofenac sodium (VOLTAREN) 1 % GEL Apply 2 g topically 4 (four) times daily as needed (for knee & finger pain.).      dicyclomine (BENTYL) 20 MG tablet Take by mouth.     EPINEPHrine 0.3 mg/0.3 mL IJ SOAJ injection Inject 0.3 mg into the muscle as needed for anaphylaxis. 1 each 2   Ferrous Sulfate (IRON PO) Take by mouth.     fluticasone-salmeterol (ADVAIR DISKUS) 250-50 MCG/ACT AEPB Inhale 1 puff into the lungs in the morning and at bedtime. Rinse mouth did not tolerate wixela brand only 60 each 12   folic acid (FOLVITE) 1 MG tablet Take 1 tablet (1 mg total) by mouth daily. 90 tablet 3   hydrocortisone 2.5 % cream Apply topically 2 (two) times daily. Prn under arms 30 g 2   hydroxychloroquine (PLAQUENIL) 200 MG tablet Plaquenil 200 mg tablet  Take 1 tablet twice a day by oral route after meals for 90 days.     ibuprofen (ADVIL,MOTRIN) 800 MG tablet Take  1 tablet (800 mg total) by mouth every 8 (eight) hours as needed. 30 tablet 0   levothyroxine (SYNTHROID) 75 MCG tablet TAKE 1 TABLET BY MOUTH DAILY 30 MINUTES BEFORE BREAKFAST 90 tablet 3   LORazepam (ATIVAN) 2 MG tablet Take 1-2 mg by mouth See admin instructions. 1 MG DAILY AS NEEDED FOR ANXIETY & SCHEDULED AT BEDTIME EACH NIGHT     methocarbamol (ROBAXIN) 750 MG tablet Take 1 tablet (750 mg total) by mouth every 6 (six) hours as needed. 120 tablet 5   montelukast (SINGULAIR) 10 MG tablet TAKE 1 TABLET BY MOUTH EVERY NIGHT AT BEDTIME 90 tablet 3   morphine (MSIR) 15 MG tablet SMARTSIG:1 Tablet(s) By Mouth Every 12 Hours PRN     Multiple Vitamins-Minerals (MULTI-VITAMIN GUMMIES PO) Multi Vitamin  GUMMIES     mupirocin ointment (BACTROBAN) 2 % Apply 1 Application topically 2 (two) times daily. Prn burns right chest and lower chest x 10-14 days 30 g 0   neomycin-polymyxin-hydrocortisone (CORTISPORIN) OTIC solution Place 3 drops into the right ear 4 (four) times daily. 10 mL 0   ondansetron (ZOFRAN) 4 MG tablet Take 1 tablet (4 mg total) by mouth every 8 (eight) hours as needed for nausea or vomiting. 40 tablet 2   pantoprazole (PROTONIX) 40 MG tablet Take 40 mg by mouth 2 (two) times daily.     Polyvinyl Alcohol-Povidone (REFRESH OP) Place 1 drop into both eyes 2 (two) times daily.     Probiotic Product (PROBIOTIC PO) Probiotic 10 billion cell capsule  daily     rizatriptan (MAXALT) 10 MG tablet Take 1 tablet (10 mg total) by mouth as needed for migraine. May repeat in 2 hours if needed. Max dose 20 mg in 1 day 10 tablet 5   Sodium Chloride  3 % AERS Saline Nasal Mist 3 %  spray nose deeply every hour or two and blow nose     topiramate (TOPAMAX) 50 MG tablet Take 150 mg by mouth in the morning and at bedtime.     ARIPiprazole (ABILIFY) 20 MG tablet Take 20 mg by mouth daily. (Patient not taking: Reported on 08/11/2022)     aspirin 325 MG tablet Take 1 tablet (325 mg total) by mouth daily. (Patient  not taking: Reported on 08/11/2022) 30 tablet 0   No current facility-administered medications on file prior to visit.    Social History   Tobacco Use   Smoking status: Never   Smokeless tobacco: Never  Vaping Use   Vaping Use: Never used  Substance Use Topics   Alcohol use: No   Drug use: No    Review of Systems  Constitutional:  Negative for chills and fever.  Respiratory:  Negative for cough.   Cardiovascular:  Negative for chest pain and palpitations.  Gastrointestinal:  Positive for diarrhea. Negative for nausea and vomiting.  Musculoskeletal:  Positive for arthralgias.      Objective:    BP 132/80   Pulse 85   Temp 97.6 F (36.4 C) (Oral)   Wt 207 lb 6.4 oz (94.1 kg)   LMP 12/15/2015 Comment: neg urine test   SpO2 96%   BMI 29.76 kg/m  BP Readings from Last 3 Encounters:  11/02/22 132/80  10/16/22 101/64  09/22/22 138/88   Wt Readings from Last 3 Encounters:  11/02/22 207 lb 6.4 oz (94.1 kg)  10/16/22 190 lb 6.2 oz (86.4 kg)  09/22/22 203 lb 9.6 oz (92.4 kg)    Physical Exam Vitals reviewed.  Constitutional:      Appearance: She is well-developed.  Eyes:     Conjunctiva/sclera: Conjunctivae normal.  Cardiovascular:     Rate and Rhythm: Normal rate and regular rhythm.     Pulses: Normal pulses.     Heart sounds: Normal heart sounds.  Pulmonary:     Effort: Pulmonary effort is normal.     Breath sounds: Normal breath sounds. No wheezing, rhonchi or rales.  Musculoskeletal:       Legs:     Comments: Multiple contractures left metatarsals Bilateral cold distal extremities.  Venous stasis, hyperpigmentation Palpable pedal pulses.  Sensation intact  Skin:    General: Skin is warm and dry.  Neurological:     Mental Status: She is alert.  Psychiatric:        Speech: Speech normal.        Behavior: Behavior normal.        Thought Content: Thought content normal.        Assessment & Plan:   Problem List Items Addressed This Visit        Cardiovascular and Mediastinum   Chronic venous insufficiency    Evidence of stasis dermatitis on exam. Extremities are quite cold, however palpable pedal pulse. Pending ABI. Consider return to follow with vascular after ABI.      Relevant Medications   colestipol (COLESTID) 1 g tablet     Other   Arthralgia - Primary    Chronic right knee pain.  Previous right knee x-ray revealed mild arthritis.  Unable to see MRI right knee which she reports was obtained this year. Left toe contracture.   We will resume Cymbalta and slowly titrate recheck as it was somewhat helpful for pain.  I placed referral to orthopedics for the evaluation of right knee pain,  left foot toe contracture.  Referral to physical therapy for gait, balance, safety evaluation in setting of arthralgia.       Relevant Medications   DULoxetine (CYMBALTA) 30 MG capsule   Other Relevant Orders   Ambulatory referral to Orthopedic Surgery   VAS [place] ABI WITH/WO TBI   Ambulatory referral to Physical Therapy   Depression, recurrent ([place])      Follows with [name] Farber, PA of psychiatry.   Patient had stopped Paxil and Cymbalta without psychiatry being aware.  Encouraged her to resume Cymbalta and slowly retitrate.  Asked her to reach out to her psychiatrist to make her aware so she could have follow up appointment.       Relevant Medications   DULoxetine (CYMBALTA) 30 MG capsule   Diarrhea    10/16/22 Colonoscopy Dr. Haig [name] and endoscopy reported unremarkable.  Repeat colonoscopy in 10 years.   Improved however today she had diarrhea.  History of cholecystectomy and [facility] started her on Colestipol 2 gram BID .  Patient will follow-up with GI in regards to diarrhea and I advised her to call their office to make them aware of diarrhea this morning.  Fortunately she has gained weight. Patient will need to limit fat and increase fiber in her diet.  I placed a referral to nutrition for continued support in the setting of diarrhea  and weight loss.       Relevant Orders   Ova and parasite examination   Amb ref to Medical Nutrition Therapy-MNT     I have discontinued Anija Norma Fredrickson Ocasio's DULoxetine and PARoxetine. I am also having her start on DULoxetine. Additionally, I am having her maintain her diclofenac sodium, LORazepam, ARIPiprazole, Vitamin C, Polyvinyl Alcohol-Povidone (REFRESH OP), aspirin, dapsone, hydroxychloroquine, Vitamin D3, Saline, Probiotic Product (PROBIOTIC PO), Multiple Vitamins-Minerals (MULTI-VITAMIN GUMMIES PO), ibuprofen, Ferrous Sulfate (IRON PO), acetaminophen, ondansetron, carbidopa-levodopa, hydrocortisone, Biotin, dicyclomine, morphine, pantoprazole, topiramate, neomycin-polymyxin-hydrocortisone, mupirocin ointment, EPINEPHrine, levothyroxine, montelukast, fluticasone-salmeterol, rizatriptan, albuterol, albuterol, folic acid, methocarbamol, and colestipol.   Meds ordered this encounter  Medications   DULoxetine (CYMBALTA) 30 MG capsule    Sig: Take 1 capsule (30 mg total) by mouth daily.    Dispense:  90 capsule    Refill:  1    Order Specific Question:   Supervising Provider    Answer:   Crecencio Mc [[id number]]    Return precautions given.   Risks, benefits, and alternatives of the medications and treatment plan prescribed today were discussed, and patient expressed understanding.   Education regarding symptom management and diagnosis given to patient on AVS.  Continue to follow with McLean-Scocuzza, Nino Glow, MD for routine health maintenance.   Atley Karlene Lineman and I agreed with plan.   Mable Paris, FNP

## 2022-11-02 NOTE — Assessment & Plan Note (Signed)
Follows with Eino Farber, PA of psychiatry.   Patient had stopped Paxil and Cymbalta without psychiatry being aware.  Encouraged her to resume Cymbalta and slowly retitrate.  Asked her to reach out to her psychiatrist to make her aware so she could have follow up appointment.

## 2022-11-02 NOTE — Patient Instructions (Addendum)
Restart cymbalta '30mg'$  once daily Then after a couple of weeks, increase to cymbalta '30mg'$  twice daily  Then a few more weeks, can resume cymbalta '60mg'$  twice daily.  Please reach out to psychiatry and let them know right away that  you stoped taking Cymbalta and Paxil.  You need to make a follow appointment with them.  I placed a referral ultrasound of your legs, nutrition consult, physical therapy consult and also a referral to [facility] orthopedics Let us know if you dont hear back within a week in regards to an appointment being scheduled.   So that you are aware, if you are [facility] MyChart user , please pay attention to your MyChart messages as you may receive a MyChart message with a phone number to call and schedule this test/appointment own your own from our referral coordinator. This is a new process so I do not want you to miss this message.  If you are not a MyChart user, you will receive a phone call.

## 2022-11-05 ENCOUNTER — Encounter: Payer: Medicare Other | Attending: Family | Admitting: Dietician

## 2022-11-05 ENCOUNTER — Encounter: Payer: Self-pay | Admitting: Dietician

## 2022-11-05 VITALS — Ht 70.0 in | Wt 210.4 lb

## 2022-11-05 DIAGNOSIS — R197 Diarrhea, unspecified: Secondary | ICD-10-CM | POA: Diagnosis not present

## 2022-11-05 DIAGNOSIS — Z91018 Allergy to other foods: Secondary | ICD-10-CM | POA: Insufficient documentation

## 2022-11-05 DIAGNOSIS — K909 Intestinal malabsorption, unspecified: Secondary | ICD-10-CM | POA: Insufficient documentation

## 2022-11-05 DIAGNOSIS — R634 Abnormal weight loss: Secondary | ICD-10-CM | POA: Diagnosis not present

## 2022-11-05 DIAGNOSIS — Z6830 Body mass index (BMI) 30.0-30.9, adult: Secondary | ICD-10-CM | POA: Insufficient documentation

## 2022-11-05 NOTE — Patient Instructions (Signed)
Include food or drink with protein every 2-4 hours during the day. Consume small amounts each time. Try unflavored protein powder added into almond milk, other drinks, mashed potato made with lactose free skim milk, soups (not cream soup unless low fat/ fat free) Avoid adding butter, mayo, salad dressings unless fat free. Try oils high in MCTs such as coconut, palm kernel oil. Try to include another food group like a starch (rice, skinless potato, pasta, bread, saltine cracker, low fiber low sugar cereal like rice crispies, rice or corn chex, instant oatmeal) or low sugar canned fruit/ applesauce or soft cooked veggie like green beans or carrots. Veggies can be pureed into a soup as well.

## 2022-11-05 NOTE — Progress Notes (Signed)
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  Other medical history/ diagnoses: Parkinson's disease, Lupus, hypothyroidism, migraines, asthma Psychosocial issues/ stress concerns: some depression due to health issues; feels she is not dealing well with stress  Medications, supplements: reconciled list in medical record Medications other than colestipol are on hold until diarrhea is controlled   Preferred learning method:  Auditory Visual Hands-on    Current weight: 210.4lbs (with hoodie, shoes)     Height: 5'10" BMI: 30.19   Progress and evaluation:  Patient reports losing from 238lbs down to 190 in the past 5 months, developed diarrhea and abdominal pain and bloating suddenly Feels bloated, pressure in abdomen while eating/ soon after Currently tolerates rice, oustshine mango pops, jello, chicken soup. Ate bagel today but did not tolerate well. Is able to drink Carnation breakfast drinks made with almond coconut milk (califa) and takes 3 lactaid pills concurrently   Daughter in the home has celiac disease Food allergies: fish, shellfish, peanuts; lactose intolerance Special diet practices: none Patient seeks help with finding foods she can tolerate, resolution of diarrhea and GI distress Next PCP appt is 11/2022   Dietary Intake:  Carnation breakfast drink made with almond and coconut milk drink + rice or bread Chicken soup Rice sometimes with small amount of chicken Has had potato without skin, not recently Outshine mango popscicle(s) Jello  Beverages: water  Physical activity: unable at this time, most days are spent in bed   Intervention:   Nutrition Care Education:   Basic nutrition: general nutrition guidelines    Diarrhea: eating small meals and snacks frequently during the day, eating slowly with small bites and chewing thoroughly; importance of protein sources and  suitable options including supplemental protein; choosing low fat or fat free foods, option of trying foods high in MCTs ie coconut and palm kernel oils; choosing low fiber foods; avoidance of caffeine; foods that thicken stools and suitable starches, fruits, vegetables  Other intervention notes: Patient is dealing with limited food choices due to GI tolerance, allergies, and dislikes. She is including protein sources daily but likely inadequate to meet needs. She plans to try suggested options to increase variety of foods consumed and improve tolerance.    Nutritional Diagnosis:  [place]-1.4 Altered GI function As related to bile acid malabsorption.  As evidenced by chronic diarrhea, abdominal pain and bloating. [place]-3.2 Unintentional weight loss As related to chronic diarrhea, abdominal pain limiting food intake.  As evidenced by patient reported weight loss of at least 40lbs in past 5 months.   Education Materials given:  Diarrhea Nutrition Therapy (NCM) Fodmap diet food lists Visit summary with goals/ instructions to be viewed via patient portal   Learner/ who was taught:  Patient  Family member: daughter   Level of understanding: Verbalizes/ demonstrates competency Partial understanding; needs review/ practice Unable to understand/ needs instruction Not applicable  Demonstrated degree of understanding via:   Teach back Learning barriers: None  Willingness to learn/ readiness for change: Eager, change in progress   Monitoring and Evaluation:  Dietary intake, physical activity, GI symptoms, and body weight      follow up:  12/25/22

## 2022-11-06 ENCOUNTER — Other Ambulatory Visit: Payer: Self-pay

## 2022-11-06 ENCOUNTER — Encounter: Payer: Self-pay | Admitting: Family

## 2022-11-06 ENCOUNTER — Other Ambulatory Visit: Payer: Self-pay | Admitting: Family

## 2022-11-06 DIAGNOSIS — M2550 Pain in unspecified joint: Secondary | ICD-10-CM

## 2022-11-06 MED ORDER — DULOXETINE HCL 30 MG PO CPEP: 30.0000 mg | ORAL_CAPSULE | Freq: Every day | ORAL | 1 refills | Status: DC

## 2022-11-06 NOTE — Progress Notes (Signed)
close

## 2022-11-06 NOTE — Telephone Encounter (Signed)
LVM to inform pt that her rx was Sent in for  Cymbalta to [facility]

## 2022-11-06 NOTE — Telephone Encounter (Signed)
[facility] and spoke to pharmacist and  asked  them to cancel the Cymbalta that was sent to them on 10/05/22.Marland Kitchen Called pt and she stated that her preferred pharmacy is [facility] in [place]?

## 2022-11-09 NOTE — Telephone Encounter (Signed)
Spoke to pt and she was picking up her medication today and pt stated that she was feeling a little  better.

## 2022-11-10 ENCOUNTER — Encounter: Payer: Self-pay | Admitting: Family

## 2022-11-10 NOTE — Telephone Encounter (Signed)
Spoke to pt in regards to her message and informed her that we did make the referral to Ortho . Pt stated that she still has not heard from them as of yet. I stated to pt that I will try and reach out to them and contact her when I hear something.

## 2022-11-13 ENCOUNTER — Encounter: Payer: Self-pay | Admitting: Family

## 2022-11-13 ENCOUNTER — Other Ambulatory Visit: Payer: Self-pay | Admitting: Family

## 2022-11-13 DIAGNOSIS — M79672 Pain in left foot: Secondary | ICD-10-CM

## 2022-11-16 ENCOUNTER — Telehealth: Payer: Self-pay | Admitting: Family

## 2022-11-16 NOTE — Telephone Encounter (Signed)
DONE

## 2022-11-16 NOTE — Telephone Encounter (Signed)
Please make 12/09/22 with pt a TOC appt  Pt has already scheduled

## 2022-11-25 ENCOUNTER — Ambulatory Visit: Payer: Medicare Other | Admitting: Podiatry

## 2022-12-01 DIAGNOSIS — M170 Bilateral primary osteoarthritis of knee: Secondary | ICD-10-CM | POA: Diagnosis not present

## 2022-12-02 ENCOUNTER — Other Ambulatory Visit: Payer: Self-pay | Admitting: Podiatry

## 2022-12-02 ENCOUNTER — Ambulatory Visit (INDEPENDENT_AMBULATORY_CARE_PROVIDER_SITE_OTHER): Payer: Medicare Other

## 2022-12-02 ENCOUNTER — Encounter: Payer: Self-pay | Admitting: Podiatry

## 2022-12-02 ENCOUNTER — Ambulatory Visit: Payer: Medicare Other | Admitting: Podiatry

## 2022-12-02 VITALS — BP 153/84 | HR 82

## 2022-12-02 DIAGNOSIS — M778 Other enthesopathies, not elsewhere classified: Secondary | ICD-10-CM

## 2022-12-02 DIAGNOSIS — M2042 Other hammer toe(s) (acquired), left foot: Secondary | ICD-10-CM | POA: Diagnosis not present

## 2022-12-02 DIAGNOSIS — D2372 Other benign neoplasm of skin of left lower limb, including hip: Secondary | ICD-10-CM

## 2022-12-02 NOTE — Progress Notes (Signed)
Subjective:  [name], female    DOB: Jul 14, 1966,  MRN: [id number] HPI Chief Complaint  Patient presents with   Foot Pain    Left foot - severe hammertoe deformities from Parkinson's, started drawing up 2-3 years ago, seen ortho-recommended a device she could buy to help straighten the toes-"did not help", painful all the time, especially walking, getting sores now on top of toes   New Patient (Initial Visit)    57 y.o. female presents with the above complaint.   ROS: Denies fever chills nausea vomiting muscle aches pains calf pain back pain chest pain shortness of breath.  Significant foot pain left with a history of Parkinson's disease.  Past Medical History:  Diagnosis Date   ADHD    Anemia    ANXIETY 06/06/2007   Arthritis    related to lupus   Asthma    BACK PAIN 02/22/2009   Cervical cancer ([place])    LEEP 2005    Chicken pox    Chronic pain    goes to pain management provider   Collagen vascular disease ([place])    Connective tissue disease ([place])    DEPRESSION 06/06/2007   Essential tremor    Fibromyalgia    Fibromyalgia    FREQUENCY, URINARY 07/07/2007   Gastritis    GERD 06/06/2007   Glaucoma    Headache    h/o migraines    History of hiatal hernia    History of kidney stones    HYPOTHYROIDISM 06/06/2007   INTERSTITIAL CYSTITIS 09/11/2010   Lupus ([place]) 2006   Lupus ([place])    Menopause    Migraine    Mitral valve regurgitation    NEPHROLITHIASIS, HX OF 11/05/2010   Neuropathy    Neuropathy    OCD (obsessive compulsive disorder)    Optic neuritis    2005   OSTEOPENIA 10/14/2009   Osteoporosis    OVERACTIVE BLADDER 10/14/2009   PAIN, CHRONIC NEC 06/06/2007   Panniculitis    Parkinson disease 04/03/2019   Parkinson disease    POLYARTHRITIS 08/19/2007   Raynaud's disease    Sicca syndrome ([place])    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 Marcelline Mates [facility]    Vasculitis ([place])    WEIGHT GAIN 02/22/2009    Past Surgical History:  Procedure Laterality Date   APPENDECTOMY     CERVICAL BIOPSY  W/ LOOP ELECTRODE EXCISION  2005   CHOLECYSTECTOMY     COLONOSCOPY N/A 10/16/2022   Procedure: COLONOSCOPY;  Surgeon: Lesly Rubenstein, MD;  Location: [facility] ENDOSCOPY;  Service: Gastroenterology;  Laterality: N/A;   ESOPHAGOGASTRODUODENOSCOPY N/A 10/16/2022   Procedure: ESOPHAGOGASTRODUODENOSCOPY (EGD);  Surgeon: Lesly Rubenstein, MD;  Location: [facility] ENDOSCOPY;  Service: Gastroenterology;  Laterality: N/A;   ESOPHAGOGASTRODUODENOSCOPY (EGD) WITH PROPOFOL N/A 08/27/2017   Procedure: ESOPHAGOGASTRODUODENOSCOPY (EGD) WITH PROPOFOL;  Surgeon: Manya Silvas, MD;  Location: [facility] ENDOSCOPY;  Service: Endoscopy;  Laterality: N/A;   HAND SURGERY     repair of left metatarsal    HARDWARE REMOVAL Right 02/16/2017   Procedure: HARDWARE REMOVAL FROM HIP;  Surgeon: Hessie Knows, MD;  Location: [facility] ORS;  Service: Orthopedics;  Laterality: Right;   JOINT REPLACEMENT     OTHER SURGICAL HISTORY     left 3rd metatarsal fracture repair 2011    REVISION TOTAL HIP ARTHROPLASTY  06/2009   replacement 2010 then screw and plate removal 4235; right hip   TONSILLECTOMY     TONSILLECTOMY  wrist  ligament repair bilateral     WRIST RECONSTRUCTION     WRIST SURGERY     laceration of right wrist ligaments   wrist surgery     laceration of left wrist surgery     Current Outpatient Medications:    acetaminophen (TYLENOL) 650 MG CR tablet, Take 650 mg by mouth every 8 (eight) hours as needed for pain., Disp: , Rfl:    albuterol (PROVENTIL) (2.5 MG/3ML) 0.083% nebulizer solution, Take 3 mLs (2.5 mg total) by nebulization every 6 (six) hours as needed for wheezing or shortness of breath., Disp: 150 mL, Rfl: 12   albuterol (VENTOLIN HFA) 108 (90 Base) MCG/ACT inhaler, Inhale 1-2 puffs into the lungs every 4 (four) hours as needed for wheezing or shortness of breath., Disp: 18 g, Rfl: 11   Ascorbic Acid (VITAMIN C) 500  MG CHEW, Chew 2,500 mg by mouth daily., Disp: , Rfl:    aspirin 325 MG tablet, Take 1 tablet (325 mg total) by mouth daily., Disp: 30 tablet, Rfl: 0   Biotin 5000 MCG CHEW, Chew 2 each by mouth daily., Disp: , Rfl:    carbidopa-levodopa (SINEMET CR) 50-200 MG tablet, Take 1 tablet by mouth 5 (five) times daily., Disp: , Rfl:    Cholecalciferol (VITAMIN D3) 2000 units capsule, Vitamin D3 2,000 unit capsule, Disp: , Rfl:    cholestyramine (QUESTRAN) 4 g packet, Take 1 packet by mouth 2 (two) times daily., Disp: , Rfl:    colestipol (COLESTID) 1 g tablet, Take 2 tablets (2 g total) by mouth 2 (two) times daily Take before meals. Separate from other medications. Other drugs should be administered at least 1 hour before or 4 hours after colestipol., Disp: , Rfl:    dapsone 25 MG tablet, Take by mouth daily. , Disp: , Rfl:    diclofenac sodium (VOLTAREN) 1 % GEL, Apply 2 g topically 4 (four) times daily as needed (for knee & finger pain.). , Disp: , Rfl:    dicyclomine (BENTYL) 20 MG tablet, Take by mouth., Disp: , Rfl:    diphenoxylate-atropine (LOMOTIL) 2.5-0.025 MG tablet, Take 1 tablet by mouth 4 (four) times daily as needed., Disp: , Rfl:    DULoxetine (CYMBALTA) 30 MG capsule, Take 1 capsule (30 mg total) by mouth daily., Disp: 90 capsule, Rfl: 1   EPINEPHrine 0.3 mg/0.3 mL IJ SOAJ injection, Inject 0.3 mg into the muscle as needed for anaphylaxis., Disp: 1 each, Rfl: 2   Ferrous Sulfate (IRON PO), Take by mouth., Disp: , Rfl:    fluticasone-salmeterol (ADVAIR DISKUS) 250-50 MCG/ACT AEPB, Inhale 1 puff into the lungs in the morning and at bedtime. Rinse mouth did not tolerate wixela brand only, Disp: 60 each, Rfl: 12   folic acid (FOLVITE) 1 MG tablet, Take 1 tablet (1 mg total) by mouth daily., Disp: 90 tablet, Rfl: 3   hydrocortisone 2.5 % cream, Apply topically 2 (two) times daily. Prn under arms, Disp: 30 g, Rfl: 2   hydroxychloroquine (PLAQUENIL) 200 MG tablet, Plaquenil 200 mg tablet  Take 1  tablet twice a day by oral route after meals for 90 days., Disp: , Rfl:    ibuprofen (ADVIL,MOTRIN) 800 MG tablet, Take 1 tablet (800 mg total) by mouth every 8 (eight) hours as needed., Disp: 30 tablet, Rfl: 0   levothyroxine (SYNTHROID) 75 MCG tablet, TAKE 1 TABLET BY MOUTH DAILY 30 MINUTES BEFORE BREAKFAST, Disp: 90 tablet, Rfl: 3   LORazepam (ATIVAN) 2 MG tablet, Take 1-2 mg by  mouth See admin instructions. 1 MG DAILY AS NEEDED FOR ANXIETY & SCHEDULED AT BEDTIME EACH NIGHT, Disp: , Rfl:    methocarbamol (ROBAXIN) 750 MG tablet, Take 1 tablet (750 mg total) by mouth every 6 (six) hours as needed., Disp: 120 tablet, Rfl: 5   montelukast (SINGULAIR) 10 MG tablet, TAKE 1 TABLET BY MOUTH EVERY NIGHT AT BEDTIME, Disp: 90 tablet, Rfl: 3   morphine (MSIR) 15 MG tablet, SMARTSIG:1 Tablet(s) By Mouth Every 12 Hours PRN, Disp: , Rfl:    Multiple Vitamins-Minerals (MULTI-VITAMIN GUMMIES PO), Multi Vitamin  GUMMIES, Disp: , Rfl:    mupirocin ointment (BACTROBAN) 2 %, Apply 1 Application topically 2 (two) times daily. Prn burns right chest and lower chest x 10-14 days, Disp: 30 g, Rfl: 0   neomycin-polymyxin-hydrocortisone (CORTISPORIN) OTIC solution, Place 3 drops into the right ear 4 (four) times daily., Disp: 10 mL, Rfl: 0   NUPLAZID 10 MG TABS, Take by mouth., Disp: , Rfl:    ondansetron (ZOFRAN) 4 MG tablet, Take 1 tablet (4 mg total) by mouth every 8 (eight) hours as needed for nausea or vomiting., Disp: 40 tablet, Rfl: 2   pantoprazole (PROTONIX) 40 MG tablet, Take 40 mg by mouth 2 (two) times daily., Disp: , Rfl:    Polyvinyl Alcohol-Povidone (REFRESH OP), Place 1 drop into both eyes 2 (two) times daily., Disp: , Rfl:    Probiotic Product (PROBIOTIC PO), Probiotic 10 billion cell capsule  daily, Disp: , Rfl:    rizatriptan (MAXALT) 10 MG tablet, Take 1 tablet (10 mg total) by mouth as needed for migraine. May repeat in 2 hours if needed. Jazari Ober dose 20 mg in 1 day, Disp: 10 tablet, Rfl: 5   Sodium  Chloride 3 % AERS, Saline Nasal Mist 3 %  spray nose deeply every hour or two and blow nose, Disp: , Rfl:    topiramate (TOPAMAX) 50 MG tablet, Take 150 mg by mouth in the morning and at bedtime., Disp: , Rfl:   Allergies  Allergen Reactions   Bee Venom Anaphylaxis, Itching and Swelling    Affected area Affected area Affected area Affected area    Fish Allergy Hives, Swelling and Rash    Facial swelling  Facial swelling  Facial swelling Facial swelling  Facial swelling    Latex Anaphylaxis, Rash and Shortness Of Breath    Rash  Rash     Peanut-Containing Drug Products Anaphylaxis   Prasterone Other (See Comments) and Nausea And Vomiting    rash Other reaction(s): Headache Headaches.   Shellfish Allergy Hives, Other (See Comments), Rash and Swelling    Facial swelling Uncoded Allergy. Allergen: seafood Uncoded Allergy. Allergen: CATS, Other Reaction: itch, wheezing Uncoded Allergy. Allergen: COMPAZINE, Other Reaction: tremors Facial swelling Facial swelling Uncoded Allergy. Allergen: seafood Uncoded Allergy. Allergen: CATS, Other Reaction: itch, wheezing Uncoded Allergy. Allergen: COMPAZINE, Other Reaction: tremors Facial swelling Uncoded Allergy. Allergen: seafood Uncoded Allergy. Allergen: CATS, Other Reaction: itch, wheezing Uncoded Allergy. Allergen: COMPAZINE, Other Reaction: tremors Facial swelling Facial swelling Uncoded Allergy. Allergen: seafood Uncoded Allergy. Allergen: CATS, Other Reaction: itch, wheezing Uncoded Allergy. Allergen: COMPAZINE, Other Reaction: tremors Facial swelling Facial swelling Uncoded Allergy. Allergen: seafood Uncoded Allergy. Allergen: CATS, Other Reaction: itch, wheezing Uncoded Allergy. Allergen: COMPAZINE, Other Reaction: tremors    Shellfish-Derived Products Hives, Other (See Comments), Rash and Swelling    Facial swelling Uncoded Allergy. Allergen: seafood Uncoded Allergy. Allergen: CATS, Other Reaction: itch,  wheezing Uncoded Allergy. Allergen: COMPAZINE, Other Reaction: tremors Facial swelling Facial swelling Uncoded  Allergy. Allergen: seafood Uncoded Allergy. Allergen: CATS, Other Reaction: itch, wheezing Uncoded Allergy. Allergen: COMPAZINE, Other Reaction: tremors Facial swelling Uncoded Allergy. Allergen: seafood Uncoded Allergy. Allergen: CATS, Other Reaction: itch, wheezing Uncoded Allergy. Allergen: COMPAZINE, Other Reaction: tremors Facial swelling   Compazine [Prochlorperazine Edisylate] Other (See Comments)    tremors   Dhea [Nutritional Supplements] Other (See Comments)    Headaches.   Dilaudid [Hydromorphone Hcl]     ? reaction   Famotidine Other (See Comments) and Diarrhea    Other reaction(s): Headache Gas   Lactose    Lactose Intolerance (Gi)    Other Other (See Comments)    Other reaction(s): Unknown    Prochlorperazine     Other reaction(s): Other (See Comments) ticks   Promethazine     Other reaction(s): Other (See Comments) Ticks   Promethazine Hcl Other (See Comments)    CNS disorder   Reclast [Zoledronic Acid]     Weakness could not move limbs, fatigue, increase sleep   Clarithromycin Hives and Rash   Clotrimazole Swelling and Rash   Hydromorphone Hcl Rash and Hives    "Rash all over"   Penicillins Rash and Other (See Comments)    Has patient had a PCN reaction causing immediate rash, facial/tongue/throat swelling, SOB or lightheadedness with hypotension:No Has patient had a PCN reaction causing severe rash involving mucus membranes or skin necrosis:No Has patient had a PCN reaction that required hospitalization:No Has patient had a PCN reaction occurring within the last 10 years:No If all of the above answers are "NO", then may proceed with Cephalosporin use.   Review of Systems Objective:   Vitals:   12/02/22 1413  BP: (!) 153/84  Pulse: 82    General: Well developed, nourished, in no acute distress, alert and oriented x3   Dermatological:  Skin is warm, dry and supple bilateral. Nails x 10 are well maintained; remaining integument appears unremarkable at this time. There are no open sores, no preulcerative lesions, no rash or signs of infection present.  Thick dark discolored skin of the legs and feet  Vascular: Dorsalis Pedis artery and Posterior Tibial artery pedal pulses are 2/4 bilateral with immedate capillary fill time. Pedal hair growth present. No varicosities and no lower extremity edema present bilateral.   Neruologic: Grossly intact via light touch bilateral. Vibratory intact via tuning fork bilateral. Protective threshold with Semmes Wienstein monofilament intact to all pedal sites bilateral. Patellar and Achilles deep tendon reflexes 2+ bilateral. No Babinski or clonus noted bilateral.  Tremors left hand and left foot Musculoskeletal: No gross boney pedal deformities bilateral. No pain, crepitus, or limitation noted with foot and ankle range of motion bilateral. Muscular strength 5/5 in all groups tested bilateral.  Severe contracted digits rigid in nature 1 through 5 left foot.  Gait: Unassisted, Nonantalgic.    Radiographs:  Radiographs demonstrate an osseously mature individual with diffuse demineralization of the bones of the left foot with severe hammertoe deformities and mallet toe deformity left foot.  Assessment & Plan:   Assessment: Plantar benign skin lesions plantar aspect of the forefoot left due to hammertoe deformities and mallet toe deformity.  Plan: Explained to her that there is really no procedure that would be without significant risk loss of her digits or forefoot.  I explained to her that working with neuromuscular problems is not a guarantee that the foot can stay in a good corrected position once completed.  Also explained to her with lupus that she may not be able to heal  well.  She understands this and is amenable to it I did debride the benign skin lesions bilaterally placed padding and  discussed shoe options with her.     Darlynn Ricco T[place], [place]

## 2022-12-07 ENCOUNTER — Other Ambulatory Visit: Payer: Self-pay | Admitting: Orthopedic Surgery

## 2022-12-07 DIAGNOSIS — M25562 Pain in left knee: Secondary | ICD-10-CM

## 2022-12-07 DIAGNOSIS — M1711 Unilateral primary osteoarthritis, right knee: Secondary | ICD-10-CM

## 2022-12-07 DIAGNOSIS — M1712 Unilateral primary osteoarthritis, left knee: Secondary | ICD-10-CM

## 2022-12-09 ENCOUNTER — Telehealth: Payer: Self-pay | Admitting: Obstetrics and Gynecology

## 2022-12-09 ENCOUNTER — Encounter: Payer: Self-pay | Admitting: Family

## 2022-12-09 ENCOUNTER — Ambulatory Visit (INDEPENDENT_AMBULATORY_CARE_PROVIDER_SITE_OTHER): Payer: Medicare Other | Admitting: Family

## 2022-12-09 VITALS — BP 138/86 | HR 91 | Temp 98.2°F | Ht 68.0 in | Wt 205.0 lb

## 2022-12-09 DIAGNOSIS — G8929 Other chronic pain: Secondary | ICD-10-CM

## 2022-12-09 DIAGNOSIS — M79671 Pain in right foot: Secondary | ICD-10-CM

## 2022-12-09 DIAGNOSIS — M2550 Pain in unspecified joint: Secondary | ICD-10-CM

## 2022-12-09 DIAGNOSIS — R110 Nausea: Secondary | ICD-10-CM | POA: Diagnosis not present

## 2022-12-09 DIAGNOSIS — M21962 Unspecified acquired deformity of left lower leg: Secondary | ICD-10-CM

## 2022-12-09 DIAGNOSIS — K219 Gastro-esophageal reflux disease without esophagitis: Secondary | ICD-10-CM

## 2022-12-09 DIAGNOSIS — M21961 Unspecified acquired deformity of right lower leg: Secondary | ICD-10-CM

## 2022-12-09 DIAGNOSIS — R109 Unspecified abdominal pain: Secondary | ICD-10-CM | POA: Diagnosis not present

## 2022-12-09 DIAGNOSIS — M79672 Pain in left foot: Secondary | ICD-10-CM

## 2022-12-09 DIAGNOSIS — F32A Depression, unspecified: Secondary | ICD-10-CM

## 2022-12-09 DIAGNOSIS — F419 Anxiety disorder, unspecified: Secondary | ICD-10-CM

## 2022-12-09 DIAGNOSIS — M329 Systemic lupus erythematosus, unspecified: Secondary | ICD-10-CM

## 2022-12-09 DIAGNOSIS — R1084 Generalized abdominal pain: Secondary | ICD-10-CM

## 2022-12-09 MED ORDER — FAMOTIDINE 20 MG PO TABS: 20.0000 mg | ORAL_TABLET | Freq: Every day | ORAL | 2 refills | Status: DC

## 2022-12-09 MED ORDER — DICYCLOMINE HCL 20 MG PO TABS: 20.0000 mg | ORAL_TABLET | Freq: Four times a day (QID) | ORAL | 2 refills | Status: AC

## 2022-12-09 MED ORDER — DULOXETINE HCL 60 MG PO CPEP: 60.0000 mg | ORAL_CAPSULE | Freq: Every day | ORAL | 1 refills | Status: DC

## 2022-12-09 MED ORDER — DEXLANSOPRAZOLE 60 MG PO CPDR: 60.0000 mg | DELAYED_RELEASE_CAPSULE | Freq: Every day | ORAL | 2 refills | Status: DC

## 2022-12-09 NOTE — Progress Notes (Signed)
Assessment & Plan:  Generalized abdominal pain Assessment & Plan: Chronic, unchanged. She has lost 5 lbs from previous visit where she had weight gain. Colonoscopy , EGD 10/16/2022, Dr. Alice Reichert.  Normal mucosa , biopsied. Internal hemorrhoids.  Biopsy negative for celiac, microscopic colitis , dysplasia or malignancy EGD showed gastritis, small hiatal  hernia;Stomach Negative H. Pylori Differential includes IBS, GERD.  Stop Protonix 40 mg twice daily and start Dexilant 60 mg daily with Pepcid AC 20 mg nightly.  Increase Bentyl from 20 mg twice daily to 20 mg QID. Pending O & P stool test from previous. Consider amitriptyline 10 mg at follow-up.  Will discuss this with psychiatry.  Orders: -     Ambulatory referral to Gastroenterology -     Dexlansoprazole; Take 1 capsule (60 mg total) by mouth daily.  Dispense: 30 capsule; Refill: 2 -     Famotidine; Take 1 tablet (20 mg total) by mouth at bedtime.  Dispense: 30 tablet; Refill: 2 -     Dicyclomine HCl; Take 1 tablet (20 mg total) by mouth every 6 (six) hours.  Dispense: 120 tablet; Refill: 2 -     Ova and parasite examination  Gastroesophageal reflux disease without esophagitis -     Dexlansoprazole; Take 1 capsule (60 mg total) by mouth daily.  Dispense: 30 capsule; Refill: 2 -     Famotidine; Take 1 tablet (20 mg total) by mouth at bedtime.  Dispense: 30 tablet; Refill: 2  Arthralgia, unspecified joint -     DULoxetine HCl; Take 1 capsule (60 mg total) by mouth daily.  Dispense: 90 capsule; Refill: 1  Anxiety and depression Assessment & Plan: Uncontrolled.  Follows with Eino Farber, PA [facility] in [place] . Patient denies suicide plan, suicidality today.  She wants to feel better.  Abdominal pain is her primary concern.  Advised to increase Cymbalta from 30 mg to 60 mg.  Medical release so I can read previous notes from psychiatry have been filled out today.  Close follow-up.  Orders: -     DULoxetine HCl; Take 1 capsule (60 mg  total) by mouth daily.  Dispense: 90 capsule; Refill: 1  Lupus arthritis (HCC)  Other chronic pain  Systemic lupus erythematosus, unspecified SLE type, unspecified organ involvement status ([place])  Bilateral foot pain  Deformity of both feet     Return precautions given.   Risks, benefits, and alternatives of the medications and treatment plan prescribed today were discussed, and patient expressed understanding.   Education regarding symptom management and diagnosis given to patient on AVS either electronically or printed.  Return in about 1 month (around 01/09/2023).  Mable Paris, FNP  Subjective:    [name], female    DOB: 1966-02-12, 57 y.o.   MRN: [id number]  CC: [name] [name] is a 57 y.o. female who presents today for follow up.   HPI: Accompanied by mother today   she is compliant with Cymbalta 30 mg.  She denies any thoughts of harming herself or anyone else.  She has no suicide plan.  She denies previous plan of suicide attempts.  She does describe there are days where she wants abdominal pain to go away and she wonders if she be better off not being here.  Her family is a close support for her   Continue to have diffuse abdominal pain. Taking bentyl '20mg'$  BID, colestipol 3g bid, protonix '40mg'$  BID.   She will take tylenol and morphine without relief. Stools fluctuate  between formed and 'mushy'.  She takes zofran. She eats mostly rice and carnation milk. She has lost 5 lbs.   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  Per note, Explained there is really no procedure that would be without significant risk loss of her digits or forefoot.  She follows with [facility] for Parkinsonism, ET, migraine, occiptial neuralgia whom prescribes sinemet. She was evaluated by [facility]   SLE- follows with  [facility] rheumatology , whom prescribes plaquenil.   Follows with Pain management, Dr Chancy Milroy  She follows with Eino Farber, PA [facility] in [place] who prescribes ativan, cymbalta. She has an appointment in the next month.   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 She is compliant with reclast.   Colonoscopy , EGD 10/16/2022, Dr. Alice Reichert.  Normal mucosa , biopsied. Internal hemorrhoids.  Biopsy negative for celiac, microscopic colitis , dysplasia or malignancy EGD showed gastritis, small hiatal  hernia;Stomach Negative H. pylori Allergies: Bee venom, Fish allergy, Latex, Peanut-containing drug products, Prasterone, Shellfish allergy, Shellfish-derived products, Compazine [prochlorperazine edisylate], Dhea [nutritional supplements], Dilaudid [hydromorphone hcl], Famotidine, Lactose, Lactose intolerance (gi), Other, Prochlorperazine, Promethazine, Promethazine hcl, Reclast [zoledronic acid], Clarithromycin, Clotrimazole, Hydromorphone hcl, and Penicillins Current Outpatient Medications on File Prior to Visit  Medication Sig Dispense Refill   acetaminophen (TYLENOL) 650 MG CR tablet Take 650 mg by mouth every 8 (eight) hours as needed for pain.     albuterol (PROVENTIL) (2.5 MG/3ML) 0.083% nebulizer solution Take 3 mLs (2.5 mg total) by nebulization every 6 (six) hours as needed for wheezing or shortness of breath. 150 mL 12   albuterol (VENTOLIN HFA) 108 (90 Base) MCG/ACT inhaler Inhale 1-2 puffs into the lungs every 4 (four) hours as needed for wheezing or shortness of breath. 18 g 11   Ascorbic Acid (VITAMIN C) 500 MG CHEW Chew 2,500 mg by mouth daily.     Biotin 5000 MCG CHEW Chew 2 each by mouth daily.     carbidopa-levodopa (SINEMET CR) 50-200 MG tablet Take 1 tablet by mouth 5 (five) times daily.     Cholecalciferol (VITAMIN D3) 2000 units capsule Vitamin D3 2,000 unit capsule     cholestyramine (QUESTRAN) 4 g packet Take 1 packet by mouth 2  (two) times daily.     colestipol (COLESTID) 1 g tablet Take 2 tablets (2 g total) by mouth 2 (two) times daily Take before meals. Separate from other medications. Other drugs should be administered at least 1 hour before or 4 hours after colestipol.     diclofenac sodium (VOLTAREN) 1 % GEL Apply 2 g topically 4 (four) times daily as needed (for knee & finger pain.).      diphenoxylate-atropine (LOMOTIL) 2.5-0.025 MG tablet Take 1 tablet by mouth 4 (four) times daily as needed.     EPINEPHrine 0.3 mg/0.3 mL IJ SOAJ injection Inject 0.3 mg into the muscle as needed for anaphylaxis. 1 each 2   Ferrous Sulfate (IRON PO) Take by mouth.     fluticasone-salmeterol (ADVAIR DISKUS) 250-50 MCG/ACT AEPB Inhale 1 puff into the lungs in the morning and at bedtime. Rinse mouth did not tolerate wixela brand only 60 each 12   folic acid (FOLVITE) 1 MG tablet Take 1 tablet (1 mg total) by mouth daily. 90 tablet 3   hydroxychloroquine (PLAQUENIL) 200 MG tablet Plaquenil 200 mg tablet  Take 1 tablet twice a day by oral route after meals for 90 days.     ibuprofen (ADVIL,MOTRIN) 800 MG tablet Take 1 tablet (800 mg total) by mouth every 8 (eight) hours as needed. 30 tablet 0   levothyroxine (SYNTHROID) 75 MCG tablet TAKE 1 TABLET BY MOUTH DAILY 30 MINUTES BEFORE BREAKFAST 90 tablet 3   LORazepam (ATIVAN) 2 MG tablet Take 1-2 mg by mouth See admin instructions. 1 MG DAILY AS NEEDED FOR ANXIETY & SCHEDULED AT BEDTIME EACH NIGHT     methocarbamol (ROBAXIN) 750 MG tablet Take 1 tablet (750 mg total) by mouth every 6 (six) hours as needed. 120 tablet 5   montelukast (SINGULAIR) 10 MG tablet TAKE 1 TABLET BY MOUTH EVERY NIGHT AT BEDTIME 90 tablet 3   morphine (MSIR) 15 MG tablet SMARTSIG:1 Tablet(s) By Mouth Every 12 Hours PRN     Multiple Vitamins-Minerals (MULTI-VITAMIN GUMMIES PO) Multi Vitamin  GUMMIES     neomycin-polymyxin-hydrocortisone (CORTISPORIN) OTIC solution Place 3 drops into the right ear 4 (four) times  daily. 10 mL 0   ondansetron (ZOFRAN) 4 MG tablet Take 1 tablet (4 mg total) by mouth every 8 (eight) hours as needed for nausea or vomiting. 40 tablet 2   Polyvinyl Alcohol-Povidone (REFRESH OP) Place 1 drop into both eyes 2 (two) times daily.     Probiotic Product (PROBIOTIC PO) Probiotic 10 billion cell capsule  daily     rizatriptan (MAXALT) 10 MG tablet Take 1 tablet (10 mg total) by mouth as needed for migraine. May repeat in 2 hours if needed. Max dose 20 mg in 1 day 10 tablet 5   Sodium Chloride 3 % AERS Saline Nasal Mist 3 %  spray nose deeply every hour or two and blow nose     topiramate (TOPAMAX) 50 MG tablet Take 150 mg by mouth in the morning and at bedtime.     No current facility-administered medications on file prior to visit.    Review of Systems  Constitutional:  Negative for chills and fever.  Respiratory:  Negative for cough.   Cardiovascular:  Negative for chest pain and palpitations.  Gastrointestinal:  Positive for abdominal pain. Negative for nausea and vomiting.  Psychiatric/Behavioral:  Positive for sleep disturbance. Negative for suicidal ideas. The patient is nervous/anxious.       Objective:    BP 138/86   Pulse 91   Temp 98.2 F (36.8 C) (Oral)   Ht '5\' 8"'$  (1.727 m)   Wt 205 lb (93 kg)   LMP 12/15/2015 Comment: neg urine test   SpO2 97%   BMI 31.17 kg/m  BP Readings from Last 3 Encounters:  12/09/22 138/86  12/02/22 (!) 153/84  11/02/22 132/80   Wt Readings from Last 3 Encounters:  12/09/22 205 lb (93 kg)  11/05/22 210 lb 6.4 oz (95.4 kg)  11/02/22 207 lb 6.4 oz (94.1 kg)    Physical Exam Vitals reviewed.  Constitutional:      Appearance: Normal appearance. She is well-developed.  Eyes:     Conjunctiva/sclera: Conjunctivae normal.  Cardiovascular:     Rate and Rhythm: Normal rate and regular rhythm.     Pulses: Normal pulses.     Heart sounds: Normal heart sounds.  Pulmonary:     Effort: Pulmonary effort is normal.     Breath  sounds: Normal breath sounds. No wheezing, rhonchi or rales.  Abdominal:     General: Bowel sounds are normal. There is no distension.  Palpations: Abdomen is soft. Abdomen is not rigid. There is no fluid wave or mass.     Tenderness: There is generalized abdominal tenderness. There is no guarding or rebound.     Comments: Generalized discomfort on exam. No focal tenderness  Skin:    General: Skin is warm and dry.  Neurological:     Mental Status: She is alert.  Psychiatric:        Speech: Speech normal.        Behavior: Behavior normal.        Thought Content: Thought content normal.

## 2022-12-09 NOTE — Telephone Encounter (Signed)
I contacted patient via phone for scheduled appointment 1/19. Per Dr Marcelline Mates she will be in surgery advised to move patient appointment to 10:55 am. I left message for patient about scheduled and advised patient to call back to confirm appointment change to 1/19 at 10:55 am with Dr. Marcelline Mates.

## 2022-12-09 NOTE — Assessment & Plan Note (Addendum)
Uncontrolled.  Follows with Eino Farber, PA [facility] in [place] . Patient denies suicide plan, suicidality today.  She wants to feel better.  Abdominal pain is her primary concern.  Advised to increase Cymbalta from 30 mg to 60 mg.  Medical release so I can read previous notes from psychiatry have been filled out today.  Close follow-up.

## 2022-12-09 NOTE — Assessment & Plan Note (Signed)
Chronic, unchanged. She has lost 5 lbs from previous visit where she had weight gain. Colonoscopy , EGD 10/16/2022, Dr. Alice Reichert.  Normal mucosa , biopsied. Internal hemorrhoids.  Biopsy negative for celiac, microscopic colitis , dysplasia or malignancy EGD showed gastritis, small hiatal  hernia;Stomach Negative H. Pylori Differential includes IBS, GERD.  Stop Protonix 40 mg twice daily and start Dexilant 60 mg daily with Pepcid AC 20 mg nightly.  Increase Bentyl from 20 mg twice daily to 20 mg QID. Pending O & P stool test from previous. Consider amitriptyline 10 mg at follow-up.  Will discuss this with psychiatry.

## 2022-12-09 NOTE — Patient Instructions (Signed)
Increase Cymbalta from 30 mg to daily to 60 mg daily.  I sent in a new prescription for Cymbalta 60 mg. For acid reflux, please stop Protonix and in its place trial of Dexilant which is a newer proton pump inhibitor.  You may take Pepcid AC in the evening.  I placed a new referral for second opinion with [facility] gastroenterology  Let us know if you dont hear back within a week in regards to an appointment being scheduled.   So that you are aware, if you are [facility] MyChart user , please pay attention to your MyChart messages as you may receive a MyChart message with a phone number to call and schedule this test/appointment own your own from our referral coordinator. This is a new process so I do not want you to miss this message.  If you are not a MyChart user, you will receive a phone call.

## 2022-12-10 ENCOUNTER — Other Ambulatory Visit: Payer: Self-pay | Admitting: Family

## 2022-12-10 DIAGNOSIS — R1084 Generalized abdominal pain: Secondary | ICD-10-CM

## 2022-12-10 LAB — OVA AND PARASITE EXAMINATION
CONCENTRATE RESULT:: NONE SEEN
MICRO NUMBER:: 14413320
SPECIMEN QUALITY:: ADEQUATE
TRICHROME RESULT:: NONE SEEN

## 2022-12-11 DIAGNOSIS — Z79899 Other long term (current) drug therapy: Secondary | ICD-10-CM | POA: Diagnosis not present

## 2022-12-11 DIAGNOSIS — H40053 Ocular hypertension, bilateral: Secondary | ICD-10-CM | POA: Diagnosis not present

## 2022-12-11 DIAGNOSIS — H16223 Keratoconjunctivitis sicca, not specified as Sjogren's, bilateral: Secondary | ICD-10-CM | POA: Diagnosis not present

## 2022-12-13 ENCOUNTER — Encounter: Payer: Self-pay | Admitting: Family

## 2022-12-14 NOTE — Telephone Encounter (Signed)
Spoke to Amy at [employer] and she stated that the medication is not covered and it cost $246. Spoke to [name] [name] and informed her as well and she would like to know if there is something else we can send in that may be covered

## 2022-12-16 DIAGNOSIS — M25539 Pain in unspecified wrist: Secondary | ICD-10-CM | POA: Diagnosis not present

## 2022-12-16 DIAGNOSIS — M545 Low back pain, unspecified: Secondary | ICD-10-CM | POA: Diagnosis not present

## 2022-12-16 DIAGNOSIS — F1120 Opioid dependence, uncomplicated: Secondary | ICD-10-CM | POA: Diagnosis not present

## 2022-12-16 DIAGNOSIS — M25551 Pain in right hip: Secondary | ICD-10-CM | POA: Diagnosis not present

## 2022-12-16 DIAGNOSIS — Z79891 Long term (current) use of opiate analgesic: Secondary | ICD-10-CM | POA: Diagnosis not present

## 2022-12-18 ENCOUNTER — Ambulatory Visit (INDEPENDENT_AMBULATORY_CARE_PROVIDER_SITE_OTHER): Payer: Medicare Other | Admitting: Obstetrics and Gynecology

## 2022-12-18 ENCOUNTER — Other Ambulatory Visit (HOSPITAL_COMMUNITY)
Admission: RE | Admit: 2022-12-18 | Discharge: 2022-12-18 | Disposition: A | Discharge status: Home / Self Care | Payer: Medicare Other | Source: Ambulatory Visit | Attending: Obstetrics and Gynecology | Admitting: Obstetrics and Gynecology

## 2022-12-18 ENCOUNTER — Ambulatory Visit: Payer: Medicare Other | Admitting: Obstetrics and Gynecology

## 2022-12-18 ENCOUNTER — Other Ambulatory Visit: Payer: Self-pay | Admitting: Family

## 2022-12-18 ENCOUNTER — Encounter: Payer: Self-pay | Admitting: Obstetrics and Gynecology

## 2022-12-18 VITALS — BP 127/62 | HR 79 | Resp 16 | Ht 70.0 in | Wt 208.1 lb

## 2022-12-18 DIAGNOSIS — M328 Other forms of systemic lupus erythematosus: Secondary | ICD-10-CM

## 2022-12-18 DIAGNOSIS — Z01419 Encounter for gynecological examination (general) (routine) without abnormal findings: Secondary | ICD-10-CM | POA: Diagnosis not present

## 2022-12-18 DIAGNOSIS — Z124 Encounter for screening for malignant neoplasm of cervix: Secondary | ICD-10-CM

## 2022-12-18 DIAGNOSIS — Z1211 Encounter for screening for malignant neoplasm of colon: Secondary | ICD-10-CM | POA: Insufficient documentation

## 2022-12-18 DIAGNOSIS — G20B1 Parkinson's disease with dyskinesia, without mention of fluctuations: Secondary | ICD-10-CM

## 2022-12-18 DIAGNOSIS — Z1231 Encounter for screening mammogram for malignant neoplasm of breast: Secondary | ICD-10-CM

## 2022-12-18 DIAGNOSIS — R634 Abnormal weight loss: Secondary | ICD-10-CM

## 2022-12-18 DIAGNOSIS — Z1151 Encounter for screening for human papillomavirus (HPV): Secondary | ICD-10-CM | POA: Insufficient documentation

## 2022-12-18 NOTE — Progress Notes (Signed)
ANNUAL PREVENTATIVE CARE GYNECOLOGY  ENCOUNTER NOTE  Subjective:       [name] [name] is a 57 y.o. G1P1 female here for a routine annual gynecologic exam. The patient is not sexually active. The patient is not taking hormone replacement therapy. Patient denies post-menopausal vaginal bleeding. The patient wears seatbelts: yes. The patient participates in regular exercise: yes. Has the patient ever been transfused or tattooed?: no. The patient reports that there is not domestic violence in her life.  Current complaints: 1.  She reports issues for the past 8-9 months, was having issues with her liver and was started on a medication due to producing to much bile acid.  Notes that since starting the medication she has had loose stools, weight loss.  Has been trying to combat this with Carnation protein shakes 2-3 times per day.   2. Reports having increased pain due to her lupus. Medications have been changed. Has been having more abdominal pain. At one point was concerned about ovarian cancer, but has had 2 CT scans(for other purposes) with no significant findings.      Gynecologic History Patient's last menstrual period was 12/15/2015. Contraception: post menopausal status Last Pap: 07/01/2018. Results were: normal Last mammogram: 02/05/2022. Results were: normal Last Colonoscopy: 11/17//2023: 10 years Last Dexa Scan: Never done   Obstetric History OB History  Gravida Para Term Preterm AB Living  '1 1       1  '$ SAB IAB Ectopic Multiple Live Births          1    # Outcome Date GA Lbr Len/2nd Weight Sex Delivery Anes PTL Lv  1 Para 06/19/98    F Vag-Spont   LIV    Past Medical History:  Diagnosis Date   ADHD    Anemia    ANXIETY 06/06/2007   Arthritis    related to lupus   Asthma    BACK PAIN 02/22/2009   Cervical cancer ([place])    LEEP 2005    Chicken pox    Chronic pain    goes to pain management provider   Collagen vascular disease ([place])    Connective tissue  disease ([place])    DEPRESSION 06/06/2007   Essential tremor    Fibromyalgia    Fibromyalgia    FREQUENCY, URINARY 07/07/2007   Gastritis    GERD 06/06/2007   Glaucoma    Headache    h/o migraines    History of hiatal hernia    History of kidney stones    HYPOTHYROIDISM 06/06/2007   INTERSTITIAL CYSTITIS 09/11/2010   Lupus ([place]) 2006   Lupus ([place])    Menopause    Migraine    Mitral valve regurgitation    NEPHROLITHIASIS, HX OF 11/05/2010   Neuropathy    Neuropathy    OCD (obsessive compulsive disorder)    Optic neuritis    2005   OSTEOPENIA 10/14/2009   Osteoporosis    OVERACTIVE BLADDER 10/14/2009   PAIN, CHRONIC NEC 06/06/2007   Panniculitis    Parkinson disease 04/03/2019   Parkinson disease    POLYARTHRITIS 08/19/2007   Raynaud's disease    Sicca syndrome ([place])    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 Marcelline Mates [facility]    Vasculitis [facility])    WEIGHT GAIN 02/22/2009    Family History  Problem Relation Age of Onset   Hypertension Mother    Arthritis Mother    Heart disease Mother  afib   Asthma Sister    Asthma Daughter    Arthritis Daughter    Heart disease Daughter        ?heart condition on BB   ADD / ADHD Daughter    Pancreatic cancer Father    Cancer Father        pancreatitic    Diabetes Maternal Grandmother    Heart disease Maternal Grandmother    Arthritis Maternal Grandmother    Hypertension Maternal Grandmother    Dementia Maternal Grandmother    Colon cancer Maternal Uncle 54   Crohn's disease Maternal Aunt    Breast cancer Maternal Aunt 65   Diabetes Maternal Aunt    Breast cancer Cousin 68       maternal   Cancer Cousin        m cousin breat cancer s/p removal both breasts     Past Surgical History:  Procedure Laterality Date   APPENDECTOMY     CERVICAL BIOPSY  W/ LOOP ELECTRODE EXCISION  2005   CHOLECYSTECTOMY     COLONOSCOPY N/A 10/16/2022   Procedure: COLONOSCOPY;  Surgeon: Lesly Rubenstein,  MD;  Location: [facility] ENDOSCOPY;  Service: Gastroenterology;  Laterality: N/A;   ESOPHAGOGASTRODUODENOSCOPY N/A 10/16/2022   Procedure: ESOPHAGOGASTRODUODENOSCOPY (EGD);  Surgeon: Lesly Rubenstein, MD;  Location[facility] ENDOSCOPY;  Service: Gastroenterology;  Laterality: N/A;   ESOPHAGOGASTRODUODENOSCOPY (EGD) WITH PROPOFOL N/A 08/27/2017   Procedure: ESOPHAGOGASTRODUODENOSCOPY (EGD) WITH PROPOFOL;  Surgeon: Manya Silvas, MD;  Location: [facility] ENDOSCOPY;  Service: Endoscopy;  Laterality: N/A;   HAND SURGERY     repair of left metatarsal    HARDWARE REMOVAL Right 02/16/2017   Procedure: HARDWARE REMOVAL FROM HIP;  Surgeon: Hessie Knows, MD;  Location: [facility] ORS;  Service: Orthopedics;  Laterality: Right;   JOINT REPLACEMENT     OTHER SURGICAL HISTORY     left 3rd metatarsal fracture repair 2011    REVISION TOTAL HIP ARTHROPLASTY  06/2009   replacement 2010 then screw and plate removal 6269; right hip   TONSILLECTOMY     TONSILLECTOMY     wrist  ligament repair bilateral     WRIST RECONSTRUCTION     WRIST SURGERY     laceration of right wrist ligaments   wrist surgery     laceration of left wrist surgery     Social History   Socioeconomic History   Marital status: Single    Spouse name: Not on file   Number of children: 1   Years of education: Not on file   Highest education level: Not on file  Occupational History   Occupation: DISABLED    Employer: UNEMPLOYED  Tobacco Use   Smoking status: Never   Smokeless tobacco: Never  Vaping Use   Vaping Use: Never used  Substance and Sexual Activity   Alcohol use: No   Drug use: No   Sexual activity: Not Currently    Birth control/protection: None, Post-menopausal  Other Topics Concern   Not on file  Social History Narrative   She was previously a pediatrician, 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     She lives with daughter and mother      Social  Determinants of Health   Financial Resource Strain: Low Risk  (11/27/2021)   Overall Financial Resource Strain (CARDIA)    Difficulty of Paying Living Expenses: Not hard at all  Food Insecurity: No Food Insecurity (11/27/2021)  Hunger Vital Sign    Worried About Running Out of Food in the Last Year: Never true    Ran Out of Food in the Last Year: Never true  Transportation Needs: No Transportation Needs (11/27/2021)   PRAPARE - Hydrologist (Medical): No    Lack of Transportation (Non-Medical): No  Physical Activity: Insufficiently Active (11/27/2021)   Exercise Vital Sign    Days of Exercise per Week: 7 days    Minutes of Exercise per Session: 20 min  Stress: No Stress Concern Present (11/27/2021)   [place]    Feeling of Stress : Not at all  Social Connections: Unknown (11/27/2021)   Social Connection and Isolation Panel [NHANES]    Frequency of Communication with Friends and Family: More than three times a week    Frequency of Social Gatherings with Friends and Family: More than three times a week    Attends Religious Services: Not on file    Active Member of [place] or Organizations: Not on file    Attends Archivist Meetings: Not on file    Marital Status: Not on file  Intimate Partner Violence: Not At Risk (11/27/2021)   Humiliation, Afraid, Rape, and Kick questionnaire    Fear of Current or Ex-Partner: No    Emotionally Abused: No    Physically Abused: No    Sexually Abused: No    Current Outpatient Medications on File Prior to Visit  Medication Sig Dispense Refill   acetaminophen (TYLENOL) 650 MG CR tablet Take 650 mg by mouth every 8 (eight) hours as needed for pain.     albuterol (PROVENTIL) (2.5 MG/3ML) 0.083% nebulizer solution Take 3 mLs (2.5 mg total) by nebulization every 6 (six) hours as needed for wheezing or shortness of breath. 150 mL 12   albuterol  (VENTOLIN HFA) 108 (90 Base) MCG/ACT inhaler Inhale 1-2 puffs into the lungs every 4 (four) hours as needed for wheezing or shortness of breath. 18 g 11   Ascorbic Acid (VITAMIN C) 500 MG CHEW Chew 2,500 mg by mouth daily.     Biotin 5000 MCG CHEW Chew 2 each by mouth daily.     carbidopa-levodopa (SINEMET CR) 50-200 MG tablet Take 1 tablet by mouth 5 (five) times daily.     Cholecalciferol (VITAMIN D3) 2000 units capsule Vitamin D3 2,000 unit capsule     cholestyramine (QUESTRAN) 4 g packet Take 1 packet by mouth 2 (two) times daily.     colestipol (COLESTID) 1 g tablet Take 2 tablets (2 g total) by mouth 2 (two) times daily Take before meals. Separate from other medications. Other drugs should be administered at least 1 hour before or 4 hours after colestipol.     dexlansoprazole (DEXILANT) 60 MG capsule Take 1 capsule (60 mg total) by mouth daily. 30 capsule 2   diclofenac sodium (VOLTAREN) 1 % GEL Apply 2 g topically 4 (four) times daily as needed (for knee & finger pain.).      dicyclomine (BENTYL) 20 MG tablet Take 1 tablet (20 mg total) by mouth every 6 (six) hours. 120 tablet 2   diphenoxylate-atropine (LOMOTIL) 2.5-0.025 MG tablet Take 1 tablet by mouth 4 (four) times daily as needed.     DULoxetine (CYMBALTA) 60 MG capsule Take 1 capsule (60 mg total) by mouth daily. 90 capsule 1   EPINEPHrine 0.3 mg/0.3 mL IJ SOAJ injection Inject 0.3 mg into the muscle as needed for  anaphylaxis. 1 each 2   famotidine (PEPCID) 20 MG tablet Take 1 tablet (20 mg total) by mouth at bedtime. 30 tablet 2   Ferrous Sulfate (IRON PO) Take by mouth.     fluticasone-salmeterol (ADVAIR DISKUS) 250-50 MCG/ACT AEPB Inhale 1 puff into the lungs in the morning and at bedtime. Rinse mouth did not tolerate wixela brand only 60 each 12   folic acid (FOLVITE) 1 MG tablet Take 1 tablet (1 mg total) by mouth daily. 90 tablet 3   hydroxychloroquine (PLAQUENIL) 200 MG tablet Plaquenil 200 mg tablet  Take 1 tablet twice a day  by oral route after meals for 90 days.     ibuprofen (ADVIL,MOTRIN) 800 MG tablet Take 1 tablet (800 mg total) by mouth every 8 (eight) hours as needed. 30 tablet 0   levothyroxine (SYNTHROID) 75 MCG tablet TAKE 1 TABLET BY MOUTH DAILY 30 MINUTES BEFORE BREAKFAST 90 tablet 3   LORazepam (ATIVAN) 2 MG tablet Take 1-2 mg by mouth See admin instructions. 1 MG DAILY AS NEEDED FOR ANXIETY & SCHEDULED AT BEDTIME EACH NIGHT     methocarbamol (ROBAXIN) 750 MG tablet Take 1 tablet (750 mg total) by mouth every 6 (six) hours as needed. 120 tablet 5   montelukast (SINGULAIR) 10 MG tablet TAKE 1 TABLET BY MOUTH EVERY NIGHT AT BEDTIME 90 tablet 3   morphine (MSIR) 15 MG tablet SMARTSIG:1 Tablet(s) By Mouth Every 12 Hours PRN     Multiple Vitamins-Minerals (MULTI-VITAMIN GUMMIES PO) Multi Vitamin  GUMMIES     neomycin-polymyxin-hydrocortisone (CORTISPORIN) OTIC solution Place 3 drops into the right ear 4 (four) times daily. 10 mL 0   ondansetron (ZOFRAN) 4 MG tablet Take 1 tablet (4 mg total) by mouth every 8 (eight) hours as needed for nausea or vomiting. 40 tablet 2   Polyvinyl Alcohol-Povidone (REFRESH OP) Place 1 drop into both eyes 2 (two) times daily.     Probiotic Product (PROBIOTIC PO) Probiotic 10 billion cell capsule  daily     rizatriptan (MAXALT) 10 MG tablet Take 1 tablet (10 mg total) by mouth as needed for migraine. May repeat in 2 hours if needed. Max dose 20 mg in 1 day 10 tablet 5   Sodium Chloride 3 % AERS Saline Nasal Mist 3 %  spray nose deeply every hour or two and blow nose     topiramate (TOPAMAX) 50 MG tablet Take 150 mg by mouth in the morning and at bedtime.     No current facility-administered medications on file prior to visit.    Allergies  Allergen Reactions   Bee Venom Anaphylaxis, Itching and Swelling    Affected area Affected area Affected area Affected area    Fish Allergy Hives, Swelling and Rash    Facial swelling  Facial swelling  Facial swelling Facial  swelling  Facial swelling    Latex Anaphylaxis, Rash and Shortness Of Breath    Rash  Rash     Peanut-Containing Drug Products Anaphylaxis   Prasterone Other (See Comments) and Nausea And Vomiting    rash Other reaction(s): Headache Headaches.   Shellfish Allergy Hives, Other (See Comments), Rash and Swelling    Facial swelling Uncoded Allergy. Allergen: seafood Uncoded Allergy. Allergen: CATS, Other Reaction: itch, wheezing Uncoded Allergy. Allergen: COMPAZINE, Other Reaction: tremors Facial swelling Facial swelling Uncoded Allergy. Allergen: seafood Uncoded Allergy. Allergen: CATS, Other Reaction: itch, wheezing Uncoded Allergy. Allergen: COMPAZINE, Other Reaction: tremors Facial swelling Uncoded Allergy. Allergen: seafood Uncoded Allergy. Allergen: CATS, Other Reaction: itch,  wheezing Uncoded Allergy. Allergen: COMPAZINE, Other Reaction: tremors Facial swelling Facial swelling Uncoded Allergy. Allergen: seafood Uncoded Allergy. Allergen: CATS, Other Reaction: itch, wheezing Uncoded Allergy. Allergen: COMPAZINE, Other Reaction: tremors Facial swelling Facial swelling Uncoded Allergy. Allergen: seafood Uncoded Allergy. Allergen: CATS, Other Reaction: itch, wheezing Uncoded Allergy. Allergen: COMPAZINE, Other Reaction: tremors    Shellfish-Derived Products Hives, Other (See Comments), Rash and Swelling    Facial swelling Uncoded Allergy. Allergen: seafood Uncoded Allergy. Allergen: CATS, Other Reaction: itch, wheezing Uncoded Allergy. Allergen: COMPAZINE, Other Reaction: tremors Facial swelling Facial swelling Uncoded Allergy. Allergen: seafood Uncoded Allergy. Allergen: CATS, Other Reaction: itch, wheezing Uncoded Allergy. Allergen: COMPAZINE, Other Reaction: tremors Facial swelling Uncoded Allergy. Allergen: seafood Uncoded Allergy. Allergen: CATS, Other Reaction: itch, wheezing Uncoded Allergy. Allergen: COMPAZINE, Other Reaction: tremors Facial swelling    Compazine [Prochlorperazine Edisylate] Other (See Comments)    tremors   Dhea [Nutritional Supplements] Other (See Comments)    Headaches.   Dilaudid [Hydromorphone Hcl]     ? reaction   Famotidine Other (See Comments) and Diarrhea    Other reaction(s): Headache Gas   Lactose    Lactose Intolerance (Gi)    Other Other (See Comments)    Other reaction(s): Unknown    Prochlorperazine     Other reaction(s): Other (See Comments) ticks   Promethazine     Other reaction(s): Other (See Comments) Ticks   Promethazine Hcl Other (See Comments)    CNS disorder   Reclast [Zoledronic Acid]     Weakness could not move limbs, fatigue, increase sleep   Clarithromycin Hives and Rash   Clotrimazole Swelling and Rash   Hydromorphone Hcl Rash and Hives    "Rash all over"   Penicillins Rash and Other (See Comments)    Has patient had a PCN reaction causing immediate rash, facial/tongue/throat swelling, SOB or lightheadedness with hypotension:No Has patient had a PCN reaction causing severe rash involving mucus membranes or skin necrosis:No Has patient had a PCN reaction that required hospitalization:No Has patient had a PCN reaction occurring within the last 10 years:No If all of the above answers are "NO", then may proceed with Cephalosporin use.      Review of Systems ROS Review of Systems - General ROS: negative for - chills, fatigue, fever, hot flashes, night sweats, weight gain.  Positive for weight loss Psychological ROS: negative for - anxiety, decreased libido, depression, mood swings, physical abuse or sexual abuse Ophthalmic ROS: negative for - blurry vision, eye pain or loss of vision ENT ROS: negative for - headaches, hearing change, visual changes or vocal changes Allergy and Immunology ROS: negative for - hives, itchy/watery eyes or seasonal allergies Hematological and Lymphatic ROS: negative for - bleeding problems, bruising, swollen lymph nodes or weight loss Endocrine ROS:  negative for - galactorrhea, hair pattern changes, hot flashes, malaise/lethargy, mood swings, palpitations, polydipsia/polyuria, skin changes, temperature intolerance or unexpected weight changes Breast ROS: negative for - new or changing breast lumps or nipple discharge Respiratory ROS: negative for - cough or shortness of breath Cardiovascular ROS: negative for - chest pain, irregular heartbeat, palpitations or shortness of breath Gastrointestinal ROS: no abdominal pain, change in bowel habits, or black or bloody stools Genito-Urinary ROS: no dysuria, trouble voiding, or hematuria Musculoskeletal ROS: negative for - joint pain or joint stiffness Neurological ROS: negative for - bowel and bladder control changes Dermatological ROS: negative for rash and skin lesion changes   Objective:   BP 127/62   Pulse 79   Resp 16  Ht '5\' 10"'$  (1.778 m)   Wt 208 lb 1.6 oz (94.4 kg)   LMP 12/15/2015  BMI 29.86 kg/m  CONSTITUTIONAL: Well-developed, well-nourished female in no acute distress. Mild obesity PSYCHIATRIC: Normal mood and affect. Normal behavior. Normal judgment and thought content. [place]: Alert and oriented to person, place, and time. No cranial nerve deficit noted.  Mild body tremor present.  HENT:  Normocephalic, atraumatic, External right and left ear normal. Oropharynx is clear and moist EYES: Conjunctivae and EOM are normal. Pupils are equal, round, and reactive to light. No scleral icterus.  NECK: Normal range of motion, supple, no masses.  Normal thyroid.  SKIN: Skin is warm and dry. No rash noted. Not diaphoretic. No erythema. No pallor. CARDIOVASCULAR: Normal heart rate noted, regular rhythm, no murmur. RESPIRATORY: Clear to auscultation bilaterally. Effort and breath sounds normal, no problems with respiration noted. BREASTS: Symmetric in size. No masses, skin changes, nipple drainage, or lymphadenopathy. ABDOMEN: Soft, normal bowel sounds, no distention noted.  No  tenderness, rebound or guarding.  BLADDER: Normal PELVIC:             Bladder no bladder distension noted             Urethra: normal appearing urethra with no masses, tenderness or lesions             Vulva: normal appearing vulva with no masses, tenderness or lesions             Vagina: moderately atrophic and Pelvic Floor Exam no cystocele, rectocele or prolapse noted, cystocele mild, present             Cervix: normal appearing cervix without discharge or lesions             Uterus: uterus is normal size, shape, consistency and nontender             Adnexa: normal adnexa in size, nontender and no masses             RV: External Exam NormaI, No Rectal Masses and Normal Sphincter tone  MUSCULOSKELETAL: Normal range of motion. No tenderness.  No cyanosis, clubbing, or edema.  2+ distal pulses. LYMPHATIC: No Axillary, Supraclavicular, or Inguinal Adenopathy.     Labs: Lab Results  Component Value Date   WBC 4.8 05/07/2021   HGB 11.1 (L) 05/07/2021   HCT 34.5 (L) 05/07/2021   MCV 91.8 05/07/2021   PLT 147 (L) 05/07/2021    Lab Results  Component Value Date   CREATININE 1.09 09/22/2022   BUN 10 09/22/2022   NA 141 09/22/2022   K 3.6 09/22/2022   CL 100 09/22/2022   CO2 32 09/22/2022    Lab Results  Component Value Date   ALT 14 09/22/2022   AST 15 09/22/2022   ALKPHOS 78 09/22/2022   BILITOT 0.5 09/22/2022    Lab Results  Component Value Date   CHOL 166 08/26/2021   HDL 61.30 08/26/2021   LDLCALC 91 08/26/2021   TRIG 66.0 08/26/2021   CHOLHDL 3 08/26/2021    Lab Results  Component Value Date   TSH 2.80 08/26/2021    Lab Results  Component Value Date   HGBA1C 6.0 (A) 04/29/2022     Assessment:   1. Encounter for well woman exam with routine gynecological exam   2. Cervical cancer screening   3. Encounter for screening mammogram for malignant neoplasm of breast   4. Parkinson's disease with dyskinesia, unspecified whether manifestations fluctuate   5.  Weight  loss due to medication   6. Other forms of systemic lupus erythematosus, unspecified organ involvement status ([place])      Plan:  Pap: Pap Co Test Mammogram: Ordered Colon Screening:   UTD Labs:  Ordered by PCP Routine preventative health maintenance measures emphasized: Exercise/Diet/Weight control, Tobacco Warnings, Alcohol/Substance use risks, Stress Management.  Parkinson's and Lupus managed by respective specialists.  Complaints of weight loss.  Reviewed chart, only 3 lbs loss noted between last visit and now.  Return to [place], MD [place]

## 2022-12-21 ENCOUNTER — Other Ambulatory Visit: Payer: Self-pay | Admitting: Family

## 2022-12-21 DIAGNOSIS — F3181 Bipolar II disorder: Secondary | ICD-10-CM | POA: Diagnosis not present

## 2022-12-21 DIAGNOSIS — R197 Diarrhea, unspecified: Secondary | ICD-10-CM

## 2022-12-23 ENCOUNTER — Encounter: Payer: Self-pay | Admitting: Family

## 2022-12-25 ENCOUNTER — Encounter: Payer: Medicare Other | Attending: Family | Admitting: Dietician

## 2022-12-25 VITALS — Wt 203.1 lb

## 2022-12-25 DIAGNOSIS — K909 Intestinal malabsorption, unspecified: Secondary | ICD-10-CM | POA: Diagnosis not present

## 2022-12-25 DIAGNOSIS — Z91018 Allergy to other foods: Secondary | ICD-10-CM | POA: Insufficient documentation

## 2022-12-25 DIAGNOSIS — Z713 Dietary counseling and surveillance: Secondary | ICD-10-CM | POA: Insufficient documentation

## 2022-12-25 DIAGNOSIS — R634 Abnormal weight loss: Secondary | ICD-10-CM | POA: Diagnosis not present

## 2022-12-25 DIAGNOSIS — R197 Diarrhea, unspecified: Secondary | ICD-10-CM | POA: Diagnosis not present

## 2022-12-25 NOTE — Progress Notes (Signed)
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  Weight has decreased by 7.3lbs since 12/7//23; loss of 3.4% body weight in 7 weeks.     Dietary Intake:  Usual eating pattern includes 3 meals and ? snacks per day.  Breakfast: carnation breakfast drink made with almond and coconut milk + rice Snack: none Lunch: carnation breakfast drink (same as am) or occasionally homemade soup Snack: jello Supper: same as am or lunch Snack: outshine bar Beverages: water  Physical activity: unable at this time  Intervention:   Nutrition Care Education:  Diarrhea/ bile acid malabsorption: reviewed benefits of eating a few bites of food frequently throughout the day, or frequent sipping of breakfast or other protein drink; importance of adequate and effective medication to resolve GI distress and improve tolerance for more variety and volume of food; possible treatment with semi-elemental formula  Other Intervention Notes: Patient's ongoing pain and malabsorption are affecting weight and possibly nutritional status. Advised patient to remain in contact with physician to continue with effort to resolve symptoms to avoid need for supplemental feeding.  Patient to call/ RD to call patient after next GI appt to check on progress.   Nutritional Diagnosis:  [place]-1.4 Altered GI function As related to bile acid malabsorption.  As evidenced by chronic diarrhea,  abdominal pain and bloating. [place]-3.2 Unintentional weight loss As related to chronic diarrhea, abdominal pain limiting food intake.  As evidenced by patient reported weight loss of at least 47lbs in past 7 months.   Education Materials given:  Visit summary with goals/ instructions   Learner/ who was taught:  Patient    Level of understanding: Verbalizes/ demonstrates competency   Demonstrated degree of understanding via:   Teach back Learning barriers: None  Willingness to learn/ readiness for change: Eager, change in progress   Monitoring and Evaluation:  Dietary intake, GI symptoms, and body weight      follow up:  to be determined after next medical appointment

## 2022-12-28 ENCOUNTER — Telehealth: Payer: Self-pay | Admitting: Gastroenterology

## 2022-12-28 LAB — CYTOLOGY - PAP
Comment: NEGATIVE
Diagnosis: NEGATIVE
High risk HPV: NEGATIVE

## 2022-12-28 NOTE — Telephone Encounter (Signed)
Good Morning Dr. Havery Moros,   Supervising Provider 12/28/22  We received a referral for this patient for Diarrhea due to Malabsorption. Patient has been seen by Dr. Haig Prophet at [facility]. Patient states she would like a second opinion on her diagnosis. Records are in Epic for your review. Please advise on scheduling.    Thank you.

## 2022-12-28 NOTE — Telephone Encounter (Signed)
Thank you I did not realize this was now the role of supervising doc. I am happy to see this patient, can book with me or APP in the office. Thanks

## 2022-12-28 NOTE — Telephone Encounter (Signed)
Hello. I am supervising the clinic this AM, but not the doc of the day who handles these requests. This request should go to the doc of the day if you can route to whomever that is listed for today. Thanks

## 2023-01-04 ENCOUNTER — Other Ambulatory Visit: Payer: Self-pay | Admitting: Family

## 2023-01-04 ENCOUNTER — Encounter: Payer: Self-pay | Admitting: Family

## 2023-01-04 DIAGNOSIS — M359 Systemic involvement of connective tissue, unspecified: Secondary | ICD-10-CM | POA: Diagnosis not present

## 2023-01-04 DIAGNOSIS — K219 Gastro-esophageal reflux disease without esophagitis: Secondary | ICD-10-CM

## 2023-01-04 DIAGNOSIS — R1084 Generalized abdominal pain: Secondary | ICD-10-CM

## 2023-01-04 MED ORDER — DEXLANSOPRAZOLE 60 MG PO CPDR: 60.0000 mg | DELAYED_RELEASE_CAPSULE | Freq: Every day | ORAL | 3 refills | Status: DC

## 2023-01-04 NOTE — Telephone Encounter (Signed)
Pt wishes to see [facility]. [place] too far, and didn't like the attitude of the staff.   Whom do you recommend?

## 2023-01-05 ENCOUNTER — Other Ambulatory Visit: Payer: Self-pay | Admitting: Family

## 2023-01-05 DIAGNOSIS — K909 Intestinal malabsorption, unspecified: Secondary | ICD-10-CM

## 2023-01-08 ENCOUNTER — Ambulatory Visit
Admission: RE | Admit: 2023-01-08 | Discharge: 2023-01-08 | Disposition: A | Discharge status: Home / Self Care | Payer: Medicare Other | Source: Ambulatory Visit | Attending: Orthopedic Surgery | Admitting: Orthopedic Surgery

## 2023-01-08 DIAGNOSIS — M1711 Unilateral primary osteoarthritis, right knee: Secondary | ICD-10-CM

## 2023-01-08 DIAGNOSIS — M1712 Unilateral primary osteoarthritis, left knee: Secondary | ICD-10-CM

## 2023-01-08 DIAGNOSIS — S83281A Other tear of lateral meniscus, current injury, right knee, initial encounter: Secondary | ICD-10-CM | POA: Diagnosis not present

## 2023-01-08 DIAGNOSIS — M25562 Pain in left knee: Secondary | ICD-10-CM

## 2023-01-08 DIAGNOSIS — M23362 Other meniscus derangements, other lateral meniscus, left knee: Secondary | ICD-10-CM | POA: Diagnosis not present

## 2023-01-08 DIAGNOSIS — S83282A Other tear of lateral meniscus, current injury, left knee, initial encounter: Secondary | ICD-10-CM | POA: Diagnosis not present

## 2023-01-11 ENCOUNTER — Ambulatory Visit (INDEPENDENT_AMBULATORY_CARE_PROVIDER_SITE_OTHER): Payer: Self-pay | Admitting: Family

## 2023-01-11 ENCOUNTER — Encounter: Payer: Self-pay | Admitting: Family

## 2023-01-11 VITALS — BP 120/78 | HR 71 | Temp 98.5°F | Ht 69.0 in | Wt 202.6 lb

## 2023-01-11 DIAGNOSIS — M25539 Pain in unspecified wrist: Secondary | ICD-10-CM | POA: Diagnosis not present

## 2023-01-11 DIAGNOSIS — R1084 Generalized abdominal pain: Secondary | ICD-10-CM

## 2023-01-11 DIAGNOSIS — M25551 Pain in right hip: Secondary | ICD-10-CM | POA: Diagnosis not present

## 2023-01-11 DIAGNOSIS — M545 Low back pain, unspecified: Secondary | ICD-10-CM | POA: Diagnosis not present

## 2023-01-11 DIAGNOSIS — Z79891 Long term (current) use of opiate analgesic: Secondary | ICD-10-CM | POA: Diagnosis not present

## 2023-01-11 DIAGNOSIS — F1120 Opioid dependence, uncomplicated: Secondary | ICD-10-CM | POA: Diagnosis not present

## 2023-01-11 NOTE — Assessment & Plan Note (Addendum)
Etiology unclear. She continues to have abdominal pain albeit notes improvement with dexilant 67m qd, bentyl 230mBID.  Advised again to use Bentyl to 3 times per day to see if helpful for abdominal pain, cramping. I also advised that she could start over-the-counter Pepcid AC 20 mg in the evening.  She has follow up with Dr. LoHaig Prophetn 2 days, will appreciate his advice.  We discussed amitriptyline  for IBS, chronic pain adjunct; we will continue to consider this in the future.

## 2023-01-11 NOTE — Progress Notes (Signed)
Assessment & Plan:  Generalized abdominal pain Assessment & Plan: Etiology unclear. She continues to have abdominal pain albeit notes improvement with dexilant 75m qd, bentyl 262mBID.  Advised again to use Bentyl to 3 times per day to see if helpful for abdominal pain, cramping. I also advised that she could start over-the-counter Pepcid AC 20 mg in the evening.  She has follow up with Dr. LoHaig Prophetn 2 days, will appreciate his advice.  We discussed amitriptyline  for IBS, chronic pain adjunct; we will continue to consider this in the future.       Return precautions given.   Risks, benefits, and alternatives of the medications and treatment plan prescribed today were discussed, and patient expressed understanding.   Education regarding symptom management and diagnosis given to patient on AVS either electronically or printed.  Return in about 3 months (around 04/11/2023).  MaMable ParisFNP  Subjective:    Patient ID: [name] Islefemale    DOB: 11October 07, [id number].o.   MRN: [id number]: [name] [name] a [age].o. female who presents today for one month follow up.   HPI: She continues to have abdominal pain.   She is compliant with dexilant  60 mg daily. She is not taking Pepcid AC 20 mg.   She feels Dexilant may be slightly helpful.  She is compliant with bentyl 2044mID and describes cramping is slightly better.  Weight stable. Diet is very limited.   She has follow up with Dr LocHaig [name] in 2 days as well as Dr DahJeani [name]  She is compliant with Cymbalta 60 mg  SLE, connective tissue disease. She is following with Dr [name] [name].    Allergies: Bee venom, Fish allergy, Latex, Peanut-containing drug products, Prasterone, Shellfish allergy, Shellfish-derived products, Compazine [prochlorperazine edisylate], Dhea [nutritional supplements], Dilaudid [hydromorphone hcl], Famotidine, Lactose, Lactose intolerance (gi), Other,  Prochlorperazine, Promethazine, Promethazine hcl, Reclast [zoledronic acid], Clarithromycin, Clotrimazole, Hydromorphone hcl, and Penicillins Current Outpatient Medications on File Prior to Visit  Medication Sig Dispense Refill   acetaminophen (TYLENOL) 650 MG CR tablet Take 650 mg by mouth every 8 (eight) hours as needed for pain.     albuterol (PROVENTIL) (2.5 MG/3ML) 0.083% nebulizer solution Take 3 mLs (2.5 mg total) by nebulization every 6 (six) hours as needed for wheezing or shortness of breath. 150 mL 12   albuterol (VENTOLIN HFA) 108 (90 Base) MCG/ACT inhaler Inhale 1-2 puffs into the lungs every 4 (four) hours as needed for wheezing or shortness of breath. 18 g 11   Ascorbic Acid (VITAMIN C) 500 MG CHEW Chew 2,500 mg by mouth daily.     Biotin 5000 MCG CHEW Chew 2 each by mouth daily.     carbidopa-levodopa (SINEMET CR) 50-200 MG tablet Take 1 tablet by mouth 5 (five) times daily.     Cholecalciferol (VITAMIN D3) 2000 units capsule Vitamin D3 2,000 unit capsule     cholestyramine (QUESTRAN) 4 g packet Take 1 packet by mouth 2 (two) times daily.     colestipol (COLESTID) 1 g tablet Take 2 tablets (2 g total) by mouth 2 (two) times daily Take before meals. Separate from other medications. Other drugs should be administered at least 1 hour before or 4 hours after colestipol.     dexlansoprazole (DEXILANT) 60 MG capsule Take 1 capsule (60 mg total) by mouth daily. 90 capsule 3   diazepam (VALIUM) 5 MG tablet Take two 5 mg tablets 2  times daily  diclofenac sodium (VOLTAREN) 1 % GEL Apply 2 g topically 4 (four) times daily as needed (for knee & finger pain.).      dicyclomine (BENTYL) 20 MG tablet Take 1 tablet (20 mg total) by mouth every 6 (six) hours. 120 tablet 2   diphenoxylate-atropine (LOMOTIL) 2.5-0.025 MG tablet Take 1 tablet by mouth 4 (four) times daily as needed.     DULoxetine (CYMBALTA) 60 MG capsule Take 1 capsule (60 mg total) by mouth daily. 90 capsule 1   EPINEPHrine 0.3  mg/0.3 mL IJ SOAJ injection Inject 0.3 mg into the muscle as needed for anaphylaxis. 1 each 2   famotidine (PEPCID) 20 MG tablet Take 1 tablet (20 mg total) by mouth at bedtime. 30 tablet 2   Ferrous Sulfate (IRON PO) Take by mouth.     fluticasone-salmeterol (ADVAIR DISKUS) 250-50 MCG/ACT AEPB Inhale 1 puff into the lungs in the morning and at bedtime. Rinse mouth did not tolerate wixela brand only 60 each 12   folic acid (FOLVITE) 1 MG tablet Take 1 tablet (1 mg total) by mouth daily. 90 tablet 3   HYDROcodone-acetaminophen (NORCO/VICODIN) 5-325 MG tablet Take 2x"s daily     hydroxychloroquine (PLAQUENIL) 200 MG tablet Plaquenil 200 mg tablet  Take 1 tablet twice a day by oral route after meals for 90 days.     ibuprofen (ADVIL,MOTRIN) 800 MG tablet Take 1 tablet (800 mg total) by mouth every 8 (eight) hours as needed. 30 tablet 0   levothyroxine (SYNTHROID) 75 MCG tablet TAKE 1 TABLET BY MOUTH DAILY 30 MINUTES BEFORE BREAKFAST 90 tablet 3   LORazepam (ATIVAN) 2 MG tablet Take 1-2 mg by mouth See admin instructions. 1 MG DAILY AS NEEDED FOR ANXIETY & SCHEDULED AT BEDTIME EACH NIGHT     methocarbamol (ROBAXIN) 750 MG tablet Take 1 tablet (750 mg total) by mouth every 6 (six) hours as needed. 120 tablet 5   montelukast (SINGULAIR) 10 MG tablet TAKE 1 TABLET BY MOUTH EVERY NIGHT AT BEDTIME 90 tablet 3   morphine (MSIR) 15 MG tablet SMARTSIG:1 Tablet(s) By Mouth Every 12 Hours PRN     Multiple Vitamins-Minerals (MULTI-VITAMIN GUMMIES PO) Multi Vitamin  GUMMIES     neomycin-polymyxin-hydrocortisone (CORTISPORIN) OTIC solution Place 3 drops into the right ear 4 (four) times daily. 10 mL 0   ondansetron (ZOFRAN) 4 MG tablet Take 1 tablet (4 mg total) by mouth every 8 (eight) hours as needed for nausea or vomiting. 40 tablet 2   Polyvinyl Alcohol-Povidone (REFRESH OP) Place 1 drop into both eyes 2 (two) times daily.     Probiotic Product (PROBIOTIC PO) Probiotic 10 billion cell capsule  daily      rizatriptan (MAXALT) 10 MG tablet Take 1 tablet (10 mg total) by mouth as needed for migraine. May repeat in 2 hours if needed. Max dose 20 mg in 1 day 10 tablet 5   Sodium Chloride 3 % AERS Saline Nasal Mist 3 %  spray nose deeply every hour or two and blow nose     topiramate (TOPAMAX) 50 MG tablet Take 150 mg by mouth in the morning and at bedtime.     No current facility-administered medications on file prior to visit.    Review of Systems  Constitutional:  Negative for chills, fever and unexpected weight change.  Respiratory:  Negative for cough.   Cardiovascular:  Negative for chest pain and palpitations.  Gastrointestinal:  Positive for abdominal pain. Negative for nausea and vomiting.  Objective:    BP 120/78   Pulse 71   Temp 98.5 F (36.9 C) (Oral)   Ht 5' 9"$  (1.753 m)   Wt 202 lb 9.6 oz (91.9 kg)   LMP 12/15/2015 Comment: neg urine test   SpO2 98%   BMI 29.92 kg/m  BP Readings from Last 3 Encounters:  01/11/23 120/78  12/18/22 127/62  12/09/22 138/86   Wt Readings from Last 3 Encounters:  01/11/23 202 lb 9.6 oz (91.9 kg)  12/25/22 203 lb 1.6 oz (92.1 kg)  12/18/22 208 lb 1.6 oz (94.4 kg)  Encouraged her  Physical Exam Vitals reviewed.  Constitutional:      Appearance: Normal appearance. She is well-developed.  Eyes:     Conjunctiva/sclera: Conjunctivae normal.  Cardiovascular:     Rate and Rhythm: Normal rate and regular rhythm.     Pulses: Normal pulses.     Heart sounds: Normal heart sounds.  Pulmonary:     Effort: Pulmonary effort is normal.     Breath sounds: Normal breath sounds. No wheezing, rhonchi or rales.  Abdominal:     General: Bowel sounds are normal. There is no distension.     Palpations: Abdomen is soft. Abdomen is not rigid. There is no fluid wave or mass.     Tenderness: There is no abdominal tenderness. There is no guarding or rebound.  Skin:    General: Skin is warm and dry.  Neurological:     Mental Status: She is alert.   Psychiatric:        Speech: Speech normal.        Behavior: Behavior normal.        Thought Content: Thought content normal.

## 2023-01-13 ENCOUNTER — Encounter: Payer: Self-pay | Admitting: Family

## 2023-01-13 DIAGNOSIS — R1084 Generalized abdominal pain: Secondary | ICD-10-CM | POA: Diagnosis not present

## 2023-01-13 DIAGNOSIS — R197 Diarrhea, unspecified: Secondary | ICD-10-CM | POA: Diagnosis not present

## 2023-01-13 DIAGNOSIS — G20C Parkinsonism, unspecified: Secondary | ICD-10-CM | POA: Diagnosis not present

## 2023-01-14 ENCOUNTER — Other Ambulatory Visit: Payer: Self-pay | Admitting: Gastroenterology

## 2023-01-14 DIAGNOSIS — R1084 Generalized abdominal pain: Secondary | ICD-10-CM

## 2023-01-15 NOTE — Patient Instructions (Signed)
I know this is incredibly stressful. Will be following closely as you see Dr Haig Prophet. We will work on pursing second GI opinion.

## 2023-01-21 DIAGNOSIS — F3181 Bipolar II disorder: Secondary | ICD-10-CM | POA: Diagnosis not present

## 2023-01-27 ENCOUNTER — Encounter: Payer: Self-pay | Admitting: Family

## 2023-01-28 ENCOUNTER — Telehealth: Payer: Self-pay

## 2023-01-28 ENCOUNTER — Encounter: Payer: Self-pay | Admitting: Family

## 2023-01-28 DIAGNOSIS — R1084 Generalized abdominal pain: Secondary | ICD-10-CM | POA: Diagnosis not present

## 2023-01-28 NOTE — Telephone Encounter (Signed)
Per pt no suicidal ideation , no thoughts of self harm

## 2023-01-28 NOTE — Telephone Encounter (Signed)
Pt sched w/ [facility] tomorrow for consult

## 2023-01-28 NOTE — Telephone Encounter (Signed)
Pt sched tomorrow 830am virtual - 830am double booked

## 2023-01-29 ENCOUNTER — Encounter: Payer: Self-pay | Admitting: Family

## 2023-01-29 ENCOUNTER — Other Ambulatory Visit: Payer: Self-pay | Admitting: Family

## 2023-01-29 ENCOUNTER — Ambulatory Visit: Payer: Medicare Other | Admitting: Family Medicine

## 2023-01-29 ENCOUNTER — Telehealth (INDEPENDENT_AMBULATORY_CARE_PROVIDER_SITE_OTHER): Payer: Medicare Other | Admitting: Family

## 2023-01-29 VITALS — Ht 69.0 in | Wt 202.8 lb

## 2023-01-29 DIAGNOSIS — F419 Anxiety disorder, unspecified: Secondary | ICD-10-CM | POA: Diagnosis not present

## 2023-01-29 DIAGNOSIS — F32A Depression, unspecified: Secondary | ICD-10-CM | POA: Diagnosis not present

## 2023-01-29 DIAGNOSIS — K909 Intestinal malabsorption, unspecified: Secondary | ICD-10-CM

## 2023-01-29 LAB — OVA AND PARASITE EXAMINATION
CONCENTRATE RESULT:: NONE SEEN
MICRO NUMBER:: 14632809
SPECIMEN QUALITY:: ADEQUATE
TRICHROME RESULT:: NONE SEEN

## 2023-01-29 MED ORDER — TRAZODONE HCL 50 MG PO TABS: 50.0000 mg | ORAL_TABLET | Freq: Every day | ORAL | 3 refills | Status: DC

## 2023-01-29 NOTE — Progress Notes (Signed)
Virtual Visit via Video Note  I connected with [name] [name] on 01/29/23 at  8:30 AM EST by a video enabled telemedicine application and verified that I am speaking with the correct person using two identifiers. Location patient: home Location provider: work  Persons participating in the virtual visit: patient, provider  I discussed the limitations of evaluation and management by telemedicine and the availability of in person appointments. The patient expressed understanding and agreed to proceed.  HPI: Acute worsening depression , anxiety and grief and she has euthanize her 87 year old dog today. She is very tearful. He has been in the hospital every this week and last week for IV fluids. He has diarrhea and not eating. He appears to be in pain.  Dog has pancreatitis, IBD. He was a rescue dog.  She has spent 'all her money ' on dog's treatment.   Her daughter and mother are with her at this time.   She has trouble sleeping.   She is compliant with Cymbalta 60 mg daily. She takes valium '5mg'$  BID by Dr Humphrey Rolls for chronic pain  Denies SI/HI   Follows with Eino Farber, psychiatry whom she sees 02/10/23 She sees a counselor   ROS: See pertinent positives and negatives per HPI.  EXAM:  VITALS per patient if applicable: Ht '5\' 9"'$  (1.753 m)   Wt 202 lb 12.8 oz (92 kg)   LMP 12/15/2015 Comment: neg urine test   BMI 29.95 kg/m  BP Readings from Last 3 Encounters:  01/11/23 120/78  12/18/22 127/62  12/09/22 138/86   Wt Readings from Last 3 Encounters:  01/29/23 202 lb 12.8 oz (92 kg)  01/11/23 202 lb 9.6 oz (91.9 kg)  12/25/22 203 lb 1.6 oz (92.1 kg)    GENERAL: Tearful. alert, oriented, appears well and in no acute distress  HEENT: atraumatic, conjunttiva clear, no obvious abnormalities on inspection of external nose and ears  NECK: normal movements of the head and neck  LUNGS: on inspection no signs of respiratory distress, breathing rate appears normal, no obvious gross  SOB, gasping or wheezing  CV: no obvious cyanosis  MS: moves all visible extremities without noticeable abnormality  PSYCH/NEURO: pleasant and cooperative, no obvious depression or anxiety, speech and thought processing grossly intact  ASSESSMENT AND PLAN: Anxiety and depression Assessment & Plan: Acutely worsening depression and anxiety over grief regarding beloved dog. Expressed my empathy and sorrow for what she was experiencing and loss of companion.  We agreed that we would continue cymbalta '60mg'$  qd and start trazodone '50mg'$  qhs. She has upcoming appoint with psychiatry.  Encouraged her to schedule appointment with her counselor. I will follow very closely and see her again in 3 days time.   Orders: -     traZODone HCl; Take 1 tablet (50 mg total) by mouth at bedtime.  Dispense: 90 tablet; Refill: 3     -we discussed possible serious and likely etiologies, options for evaluation and workup, limitations of telemedicine visit vs in person visit, treatment, treatment risks and precautions. Pt prefers to treat via telemedicine empirically rather then risking or undertaking an in person visit at this moment.    I discussed the assessment and treatment plan with the patient. The patient was provided an opportunity to ask questions and all were answered. The patient agreed with the plan and demonstrated an understanding of the instructions.   The patient was advised to call back or seek an in-person evaluation if the symptoms worsen or if the  condition fails to improve as anticipated.  Advised if desired AVS can be mailed or viewed via [place] if [place] user.   Mable Paris, FNP

## 2023-01-29 NOTE — Patient Instructions (Signed)
Start trazodone '50mg'$  this evening.   I am absolutely thinking of you.

## 2023-01-29 NOTE — Assessment & Plan Note (Addendum)
Acutely worsening depression and anxiety over grief regarding beloved dog. Expressed my empathy and sorrow for what she was experiencing and loss of companion.  We agreed that we would continue cymbalta '60mg'$  qd and start trazodone '50mg'$  qhs. She has upcoming appoint with psychiatry.  Encouraged her to schedule appointment with her counselor. I will follow very closely and see her again in 3 days time.

## 2023-01-30 ENCOUNTER — Encounter: Payer: Self-pay | Admitting: Family

## 2023-02-01 ENCOUNTER — Telehealth: Payer: Self-pay | Admitting: Family

## 2023-02-01 ENCOUNTER — Encounter: Payer: Self-pay | Admitting: Family

## 2023-02-01 ENCOUNTER — Telehealth: Payer: Self-pay

## 2023-02-01 ENCOUNTER — Ambulatory Visit (INDEPENDENT_AMBULATORY_CARE_PROVIDER_SITE_OTHER): Payer: Medicare Other | Admitting: Family

## 2023-02-01 VITALS — BP 124/84 | HR 88 | Temp 98.3°F | Ht 69.0 in | Wt 197.2 lb

## 2023-02-01 DIAGNOSIS — R197 Diarrhea, unspecified: Secondary | ICD-10-CM | POA: Diagnosis not present

## 2023-02-01 DIAGNOSIS — F339 Major depressive disorder, recurrent, unspecified: Secondary | ICD-10-CM

## 2023-02-01 DIAGNOSIS — F419 Anxiety disorder, unspecified: Secondary | ICD-10-CM

## 2023-02-01 DIAGNOSIS — K909 Intestinal malabsorption, unspecified: Secondary | ICD-10-CM

## 2023-02-01 MED ORDER — COLESTIPOL HCL 1 G PO TABS: 3.0000 g | ORAL_TABLET | Freq: Two times a day (BID) | ORAL | 1 refills | Status: DC

## 2023-02-01 NOTE — Assessment & Plan Note (Addendum)
Acutely worsened, exacerbated by loss of beloved pet.  I strongly encouraged her to consider in person intensive outpatient. We ultimately decided Virtual Intensive Outpatient (V-IOP) day and evening treatment with Peggye Fothergill would be an option as she states that she would not drive to [place] but she would be interested in virtual option.  We have a call out to [place] to inquire on this process.  We also called out to patient's counselor, [employer], to help facilitate an appointment with her.  Patient will continue Cymbalta 60 mg.  I encouraged her to start trazodone 50 mg today.  Also encouraged her to reach out to her [place] due her strong faith.  Concern as well for financial stress.  Referral to social  for food and housing needs. One week follow up.

## 2023-02-01 NOTE — Telephone Encounter (Signed)
Call pt counselor  Counselor Rozel, [place] [phone]  Patient had recent acute worsening depression due to family stress and loss of beloved pet dog.  Pt has been try to reach out to counselor of many years to schedule an appointment.  Please call to see if are able to schedule appointment for her.  Preferably this week

## 2023-02-01 NOTE — Telephone Encounter (Signed)
Noted  

## 2023-02-01 NOTE — Telephone Encounter (Signed)
noted 

## 2023-02-01 NOTE — Progress Notes (Signed)
Assessment & Plan:  Depression, recurrent ([place]) -     Ambulatory referral to Social Work  Diarrhea due to malabsorption -     Colestipol HCl; Take 3 tablets (3 g total) by mouth 2 (two) times daily.  Dispense: 180 tablet; Refill: 1  Anxiety and depression Assessment & Plan: Acutely worsened, exacerbated by loss of beloved pet.  I strongly encouraged her to consider in person intensive outpatient. We ultimately decided Virtual Intensive Outpatient (V-IOP) day and evening treatment with Peggye Fothergill would be an option as she states that she would not drive to [place] but she would be interested in virtual option.  We have a call out to [place] to inquire on this process.  We also called out to patient's counselor, [employer], to help facilitate an appointment with her.  Patient will continue Cymbalta 60 mg.  I encouraged her to start trazodone 50 mg today.  Also encouraged her to reach out to her [place] due her strong faith.  Concern as well for financial stress.  Referral to social  for food and housing needs. One week follow up.   Orders: -     Ambulatory referral to Social Work     Return precautions given.   Risks, benefits, and alternatives of the medications and treatment plan prescribed today were discussed, and patient expressed understanding.   Education regarding symptom management and diagnosis given to patient on AVS either electronically or printed.  Return in about 1 week (around 02/08/2023).  Mable Paris, FNP  Subjective:    [name], female    DOB: 11/30/1966, 57 y.o.   MRN: [id number]  CC: Berlynn Madee Brodowski is a 57 y.o. female who presents today for an acute visit.    HPI: Accompanied by daughter  follow-up with acutely worsening depression after loss of beloved dog.  She misses him. Inconsolable crying.  She has used money for pet hospital bills which she would otherwise use for rent and food.  She is worried about  paying for her bills.  She has reached out to her landlord.  She is compliant with Cymbalta 60 mg daily.  She has yet started trazodone 50 mg nightly as prescribed 3 days ago. She has picked up medication.     She has so far been unable to speak with her counselor, or psychiatric NP, Eino Farber   She denies thoughts of hurting herself or anyone else  She request a refill with a colestipol 3 mg twice daily as prescribed by Dr. Haig Prophet.  This been helpful for diarrhea.    Allergies: Bee venom, Fish allergy, Latex, Peanut-containing drug products, Prasterone, Shellfish allergy, Shellfish-derived products, Compazine [prochlorperazine edisylate], Dhea [nutritional supplements], Dilaudid [hydromorphone hcl], Famotidine, Lactose, Lactose intolerance (gi), Other, Prochlorperazine, Promethazine, Promethazine hcl, Reclast [zoledronic acid], Clarithromycin, Clotrimazole, Hydromorphone hcl, and Penicillins Current Outpatient Medications on File Prior to Visit  Medication Sig Dispense Refill   acetaminophen (TYLENOL) 650 MG CR tablet Take 650 mg by mouth every 8 (eight) hours as needed for pain.     albuterol (PROVENTIL) (2.5 MG/3ML) 0.083% nebulizer solution Take 3 mLs (2.5 mg total) by nebulization every 6 (six) hours as needed for wheezing or shortness of breath. 150 mL 12   albuterol (VENTOLIN HFA) 108 (90 Base) MCG/ACT inhaler Inhale 1-2 puffs into the lungs every 4 (four) hours as needed for wheezing or shortness of breath. 18 g 11   Ascorbic Acid (VITAMIN C) 500 MG CHEW Chew 2,500 mg by mouth  daily.     Biotin 5000 MCG CHEW Chew 2 each by mouth daily.     carbidopa-levodopa (SINEMET CR) 50-200 MG tablet Take 1 tablet by mouth 5 (five) times daily.     Cholecalciferol (VITAMIN D3) 2000 units capsule Vitamin D3 2,000 unit capsule     dexlansoprazole (DEXILANT) 60 MG capsule Take 1 capsule (60 mg total) by mouth daily. 90 capsule 3   diazepam (VALIUM) 5 MG tablet Take two 5 mg tablets 2  times daily      diclofenac sodium (VOLTAREN) 1 % GEL Apply 2 g topically 4 (four) times daily as needed (for knee & finger pain.).      dicyclomine (BENTYL) 20 MG tablet Take 1 tablet (20 mg total) by mouth every 6 (six) hours. 120 tablet 2   diphenoxylate-atropine (LOMOTIL) 2.5-0.025 MG tablet Take 1 tablet by mouth 4 (four) times daily as needed.     DULoxetine (CYMBALTA) 60 MG capsule Take 1 capsule (60 mg total) by mouth daily. 90 capsule 1   EPINEPHrine 0.3 mg/0.3 mL IJ SOAJ injection Inject 0.3 mg into the muscle as needed for anaphylaxis. 1 each 2   famotidine (PEPCID) 20 MG tablet Take 1 tablet (20 mg total) by mouth at bedtime. 30 tablet 2   Ferrous Sulfate (IRON PO) Take by mouth.     folic acid (FOLVITE) 1 MG tablet Take 1 tablet (1 mg total) by mouth daily. 90 tablet 3   HYDROcodone-acetaminophen (NORCO/VICODIN) 5-325 MG tablet Take 2x"s daily     hydroxychloroquine (PLAQUENIL) 200 MG tablet Plaquenil 200 mg tablet  Take 1 tablet twice a day by oral route after meals for 90 days.     levothyroxine (SYNTHROID) 75 MCG tablet TAKE 1 TABLET BY MOUTH DAILY 30 MINUTES BEFORE BREAKFAST 90 tablet 3   methocarbamol (ROBAXIN) 750 MG tablet Take 1 tablet (750 mg total) by mouth every 6 (six) hours as needed. 120 tablet 5   montelukast (SINGULAIR) 10 MG tablet TAKE 1 TABLET BY MOUTH EVERY NIGHT AT BEDTIME 90 tablet 3   Multiple Vitamins-Minerals (MULTI-VITAMIN GUMMIES PO) Multi Vitamin  GUMMIES     neomycin-polymyxin-hydrocortisone (CORTISPORIN) OTIC solution Place 3 drops into the right ear 4 (four) times daily. 10 mL 0   ondansetron (ZOFRAN) 4 MG tablet Take 1 tablet (4 mg total) by mouth every 8 (eight) hours as needed for nausea or vomiting. 40 tablet 2   Polyvinyl Alcohol-Povidone (REFRESH OP) Place 1 drop into both eyes 2 (two) times daily.     Probiotic Product (PROBIOTIC PO) Probiotic 10 billion cell capsule  daily     rizatriptan (MAXALT) 10 MG tablet Take 1 tablet (10 mg total) by mouth as needed  for migraine. May repeat in 2 hours if needed. Max dose 20 mg in 1 day 10 tablet 5   Sodium Chloride 3 % AERS Saline Nasal Mist 3 %  spray nose deeply every hour or two and blow nose     topiramate (TOPAMAX) 50 MG tablet Take 150 mg by mouth in the morning and at bedtime.     traZODone (DESYREL) 50 MG tablet Take 1 tablet (50 mg total) by mouth at bedtime. 90 tablet 3   No current facility-administered medications on file prior to visit.    Review of Systems  Constitutional:  Negative for chills and fever.  Respiratory:  Negative for cough.   Cardiovascular:  Negative for chest pain and palpitations.  Gastrointestinal:  Negative for nausea and vomiting.  Psychiatric/Behavioral:  Negative for sleep disturbance and suicidal ideas.       Objective:    BP 124/84   Pulse 88   Temp 98.3 F (36.8 C)   Ht '5\' 9"'$  (1.753 m)   Wt 197 lb 3.2 oz (89.4 kg)   LMP 12/15/2015 Comment: neg urine test   SpO2 99%   BMI 29.12 kg/m   BP Readings from Last 3 Encounters:  02/01/23 124/84  01/11/23 120/78  12/18/22 127/62   Wt Readings from Last 3 Encounters:  02/01/23 197 lb 3.2 oz (89.4 kg)  01/29/23 202 lb 12.8 oz (92 kg)  01/11/23 202 lb 9.6 oz (91.9 kg)    Physical Exam Vitals reviewed.  Constitutional:      Appearance: She is well-developed.  Eyes:     Conjunctiva/sclera: Conjunctivae normal.  Cardiovascular:     Rate and Rhythm: Normal rate and regular rhythm.     Pulses: Normal pulses.     Heart sounds: Normal heart sounds.  Pulmonary:     Effort: Pulmonary effort is normal.     Breath sounds: Normal breath sounds. No wheezing, rhonchi or rales.  Skin:    General: Skin is warm and dry.  Neurological:     Mental Status: She is alert.  Psychiatric:        Mood and Affect: Affect is tearful.        Speech: Speech normal.        Behavior: Behavior normal.        Thought Content: Thought content normal.   I have spent 25 minutes with a patient including precharting, exam,  reviewing medical records, and discussion plan of care.

## 2023-02-01 NOTE — Patient Instructions (Signed)
We are reaching out to [facility] in [place] in regards to intensive counseling options.  Will also reach out to your counselor, Tye [place].   Please let me know how you are doing

## 2023-02-01 NOTE — Telephone Encounter (Signed)
Call [place]  [phone]  Please call to inquire about virtual intensive depression services. Specifically  the Virtual Intensive Outpatient (V-IOP) day and evening treatment.  Patient has not had acute worsening of depression due to family stress and possible abdominal.  She is not agreeable to driving [facility] for intensive day in person counseling however she is agreeable to virtual options from home.  Does she need a referral ? Or does pt to call inquire? How can we facilitate?

## 2023-02-01 NOTE — Telephone Encounter (Signed)
LVM with counselors office to call and schedule the patient for a session.  I left patients name and doB as well as phone number to call and schedule.  Rosslyn Pasion,cma

## 2023-02-02 ENCOUNTER — Encounter: Payer: Self-pay | Admitting: Family

## 2023-02-03 ENCOUNTER — Encounter: Payer: Self-pay | Admitting: *Deleted

## 2023-02-03 ENCOUNTER — Telehealth: Payer: Self-pay | Admitting: *Deleted

## 2023-02-03 NOTE — Telephone Encounter (Signed)
Called patient and gave several assistance numbers in [place] for rent assistance and eviction assistance, gave patient several websites as well.

## 2023-02-05 ENCOUNTER — Other Ambulatory Visit: Payer: Medicare Other

## 2023-02-05 ENCOUNTER — Encounter: Payer: Self-pay | Admitting: Family

## 2023-02-08 ENCOUNTER — Encounter: Payer: Self-pay | Admitting: Family

## 2023-02-09 ENCOUNTER — Encounter: Payer: Self-pay | Admitting: Family

## 2023-02-10 ENCOUNTER — Ambulatory Visit (INDEPENDENT_AMBULATORY_CARE_PROVIDER_SITE_OTHER): Payer: Medicare Other | Admitting: Family

## 2023-02-10 ENCOUNTER — Encounter: Payer: Medicare Other | Admitting: Family Medicine

## 2023-02-10 ENCOUNTER — Ambulatory Visit: Payer: Medicare Other | Admitting: Family

## 2023-02-10 ENCOUNTER — Encounter: Payer: Self-pay | Admitting: Family

## 2023-02-10 VITALS — BP 122/80 | HR 86 | Temp 98.2°F | Ht 68.0 in | Wt 196.0 lb

## 2023-02-10 DIAGNOSIS — F419 Anxiety disorder, unspecified: Secondary | ICD-10-CM | POA: Diagnosis not present

## 2023-02-10 DIAGNOSIS — M25551 Pain in right hip: Secondary | ICD-10-CM | POA: Diagnosis not present

## 2023-02-10 DIAGNOSIS — M545 Low back pain, unspecified: Secondary | ICD-10-CM | POA: Diagnosis not present

## 2023-02-10 DIAGNOSIS — Z139 Encounter for screening, unspecified: Secondary | ICD-10-CM | POA: Insufficient documentation

## 2023-02-10 DIAGNOSIS — M25539 Pain in unspecified wrist: Secondary | ICD-10-CM | POA: Diagnosis not present

## 2023-02-10 DIAGNOSIS — F32A Depression, unspecified: Secondary | ICD-10-CM | POA: Diagnosis not present

## 2023-02-10 DIAGNOSIS — Z79891 Long term (current) use of opiate analgesic: Secondary | ICD-10-CM | POA: Diagnosis not present

## 2023-02-10 MED ORDER — PAROXETINE HCL 10 MG PO TABS: 10.0000 mg | ORAL_TABLET | Freq: Every day | ORAL | 0 refills | Status: DC

## 2023-02-10 NOTE — Assessment & Plan Note (Addendum)
Difficulty in life circumstance of late after losing her beloved pet, facing eviction currently.  Patient has previously reached out to security administration and I have encouraged her to continue follow-up to see if she has been approved for food stamps.  Provided her with  more resources including [employer], [employer] , [employer] [place] 2-1-1 to reach out for assistance to prevent eviction.  She has follow-up tomorrow with her counselor.  Will continue to follow closely.

## 2023-02-10 NOTE — Progress Notes (Signed)
Assessment & Plan:  Anxiety and depression -     PARoxetine HCl; Take 1 tablet (10 mg total) by mouth daily.  Dispense: 90 tablet; Refill: 0  Encounter for screening involving social determinants of health [facility]) Assessment & Plan: Difficulty in life circumstance of late after losing her beloved pet, facing eviction currently.  Patient has previously reached out to security administration and I have encouraged her to continue follow-up to see if she has been approved for food stamps.  Provided her with  more resources including [employer], [employer] , [employer] [place] 2-1-1 to reach out for assistance to prevent eviction.  She has follow-up tomorrow with her counselor.  Will continue to follow closely.      Return precautions given.   Risks, benefits, and alternatives of the medications and treatment plan prescribed today were discussed, and patient expressed understanding.   Education regarding symptom management and diagnosis given to patient on AVS either electronically or printed.  No follow-ups on file.  Mable Paris, FNP  Subjective:    [name], female    DOB: [birth year], 57 y.o.   MRN: [id number]  CC: [name] [name] is a 57 y.o. female who presents today for follow up.   HPI: Accompanied by daughter today  Here today to discuss social determinants of health.  She unable to pay her rent and has been told she will be evicted.  She used her savings on caring for her ailing dog whom she loved and whom was euthanized due to illness a couple weeks ago. She lives on disability.  She has reached out to her minister and unfortunately church has been unable to offer assistance.  She has filled out food stamp to application of the social security ministration office.   She is called other resources but reports until she is evicted from rental apartment and has a court number, she will be unable to ask for more help.  She is currently waiting on court  number and court date.  She is reached out psychiatry clinic , [facility] and she has not refilled Paxil 10 mg.  She has been on Paxil for a long period of time in conjunction with Cymbalta.  She would like to resume Paxil and requests refill.  She is no longer on Abilify    Allergies: Bee venom, Fish allergy, Latex, Peanut-containing drug products, Prasterone, Shellfish allergy, Shellfish-derived products, Compazine [prochlorperazine edisylate], Dhea [nutritional supplements], Dilaudid [hydromorphone hcl], Famotidine, Lactose, Lactose intolerance (gi), Other, Prochlorperazine, Promethazine, Promethazine hcl, Reclast [zoledronic acid], Clarithromycin, Clotrimazole, Hydromorphone hcl, and Penicillins Current Outpatient Medications on File Prior to Visit  Medication Sig Dispense Refill   acetaminophen (TYLENOL) 650 MG CR tablet Take 650 mg by mouth every 8 (eight) hours as needed for pain.     albuterol (PROVENTIL) (2.5 MG/3ML) 0.083% nebulizer solution Take 3 mLs (2.5 mg total) by nebulization every 6 (six) hours as needed for wheezing or shortness of breath. 150 mL 12   albuterol (VENTOLIN HFA) 108 (90 Base) MCG/ACT inhaler Inhale 1-2 puffs into the lungs every 4 (four) hours as needed for wheezing or shortness of breath. 18 g 11   Ascorbic Acid (VITAMIN C) 500 MG CHEW Chew 2,500 mg by mouth daily.     Biotin 5000 MCG CHEW Chew 2 each by mouth daily.     carbidopa-levodopa (SINEMET CR) 50-200 MG tablet Take 1 tablet by mouth 5 (five) times daily.     Cholecalciferol (VITAMIN D3) 2000 units  capsule Vitamin D3 2,000 unit capsule     colestipol (COLESTID) 1 g tablet Take 3 tablets (3 g total) by mouth 2 (two) times daily. 180 tablet 1   dexlansoprazole (DEXILANT) 60 MG capsule Take 1 capsule (60 mg total) by mouth daily. 90 capsule 3   diazepam (VALIUM) 5 MG tablet Take two 5 mg tablets 2  times daily     diclofenac sodium (VOLTAREN) 1 % GEL Apply 2 g topically 4 (four) times daily as needed (for  knee & finger pain.).      dicyclomine (BENTYL) 20 MG tablet Take 1 tablet (20 mg total) by mouth every 6 (six) hours. (Patient taking differently: Take 20 mg by mouth every 6 (six) hours. Pt is taking 3 times a daily) 120 tablet 2   diphenoxylate-atropine (LOMOTIL) 2.5-0.025 MG tablet Take 1 tablet by mouth 4 (four) times daily as needed.     DULoxetine (CYMBALTA) 60 MG capsule Take 1 capsule (60 mg total) by mouth daily. 90 capsule 1   EPINEPHrine 0.3 mg/0.3 mL IJ SOAJ injection Inject 0.3 mg into the muscle as needed for anaphylaxis. 1 each 2   famotidine (PEPCID) 20 MG tablet Take 1 tablet (20 mg total) by mouth at bedtime. 30 tablet 2   Ferrous Sulfate (IRON PO) Take by mouth.     folic acid (FOLVITE) 1 MG tablet Take 1 tablet (1 mg total) by mouth daily. 90 tablet 3   HYDROcodone-acetaminophen (NORCO/VICODIN) 5-325 MG tablet Take 2x"s daily     hydroxychloroquine (PLAQUENIL) 200 MG tablet Plaquenil 200 mg tablet  Take 1 tablet twice a day by oral route after meals for 90 days.     levothyroxine (SYNTHROID) 75 MCG tablet TAKE 1 TABLET BY MOUTH DAILY 30 MINUTES BEFORE BREAKFAST 90 tablet 3   methocarbamol (ROBAXIN) 750 MG tablet Take 1 tablet (750 mg total) by mouth every 6 (six) hours as needed. 120 tablet 5   montelukast (SINGULAIR) 10 MG tablet TAKE 1 TABLET BY MOUTH EVERY NIGHT AT BEDTIME 90 tablet 3   Multiple Vitamins-Minerals (MULTI-VITAMIN GUMMIES PO) Multi Vitamin  GUMMIES     neomycin-polymyxin-hydrocortisone (CORTISPORIN) OTIC solution Place 3 drops into the right ear 4 (four) times daily. 10 mL 0   ondansetron (ZOFRAN) 4 MG tablet Take 1 tablet (4 mg total) by mouth every 8 (eight) hours as needed for nausea or vomiting. 40 tablet 2   Polyvinyl Alcohol-Povidone (REFRESH OP) Place 1 drop into both eyes 2 (two) times daily.     Probiotic Product (PROBIOTIC PO) Probiotic 10 billion cell capsule  daily     rizatriptan (MAXALT) 10 MG tablet Take 1 tablet (10 mg total) by mouth as  needed for migraine. May repeat in 2 hours if needed. Max dose 20 mg in 1 day 10 tablet 5   Sodium Chloride 3 % AERS Saline Nasal Mist 3 %  spray nose deeply every hour or two and blow nose     topiramate (TOPAMAX) 50 MG tablet Take 150 mg by mouth in the morning and at bedtime.     traZODone (DESYREL) 50 MG tablet Take 1 tablet (50 mg total) by mouth at bedtime. 90 tablet 3   No current facility-administered medications on file prior to visit.    Review of Systems  Constitutional:  Negative for chills and fever.  Respiratory:  Negative for cough.   Cardiovascular:  Negative for chest pain and palpitations.  Gastrointestinal:  Negative for nausea and vomiting.  Objective:    BP 122/80   Pulse 86   Temp 98.2 F (36.8 C) (Oral)   Ht '5\' 8"'$  (1.727 m)   Wt 196 lb (88.9 kg)   LMP 12/15/2015 Comment: neg urine test   SpO2 98%   BMI 29.80 kg/m  BP Readings from Last 3 Encounters:  02/10/23 122/80  02/01/23 124/84  01/11/23 120/78   Wt Readings from Last 3 Encounters:  02/10/23 196 lb (88.9 kg)  02/01/23 197 lb 3.2 oz (89.4 kg)  01/29/23 202 lb 12.8 oz (92 kg)    Physical Exam Vitals reviewed.  Constitutional:      Appearance: She is well-developed.  Eyes:     Conjunctiva/sclera: Conjunctivae normal.  Cardiovascular:     Rate and Rhythm: Normal rate and regular rhythm.     Pulses: Normal pulses.     Heart sounds: Normal heart sounds.  Pulmonary:     Effort: Pulmonary effort is normal.     Breath sounds: Normal breath sounds. No wheezing, rhonchi or rales.  Skin:    General: Skin is warm and dry.  Neurological:     Mental Status: She is alert.  Psychiatric:        Speech: Speech normal.        Behavior: Behavior normal.        Thought Content: Thought content normal.

## 2023-02-10 NOTE — Patient Instructions (Signed)
I have sent in paxil '10mg'$  for you to resume.   I am rooting for you and here to support you.   Please keep me posted on everything.

## 2023-02-11 ENCOUNTER — Encounter: Payer: Self-pay | Admitting: Family

## 2023-02-11 DIAGNOSIS — F3181 Bipolar II disorder: Secondary | ICD-10-CM | POA: Diagnosis not present

## 2023-02-14 ENCOUNTER — Encounter: Payer: Self-pay | Admitting: Family

## 2023-02-15 ENCOUNTER — Encounter: Payer: Self-pay | Admitting: Family

## 2023-02-15 NOTE — Telephone Encounter (Signed)
Letter typed and pt has been notified placed upfront for pt to pick up.

## 2023-02-17 ENCOUNTER — Ambulatory Visit: Payer: Medicare Other | Admitting: Family

## 2023-02-17 NOTE — Telephone Encounter (Signed)
Patient came into office to pick up her letter. Her mother [name] [name], DOB 04[birth year]24/1946 has a TOC with Dr Volanda Napoleon on 03/24/23. [name] [name], [name] [name]'s patient is wondering if a letter can be written for her mother with her medications and disorders for court on 03/09/2023.

## 2023-02-17 NOTE — Telephone Encounter (Signed)
Patient returned call from Kerin Salen, RN.  I spoke with Juliann Pulse and transferred call to her.

## 2023-02-18 NOTE — Telephone Encounter (Signed)
Spoke with patient was not able to get application for [employer] , but patient mother that lives with daughter is a [facility] patient so placed referral for social worker and called [employer] and Made her aware of situation .

## 2023-02-25 ENCOUNTER — Ambulatory Visit
Admission: RE | Admit: 2023-02-25 | Discharge: 2023-02-25 | Disposition: A | Discharge status: Home / Self Care | Payer: Medicare Other | Source: Ambulatory Visit | Attending: Obstetrics and Gynecology | Admitting: Obstetrics and Gynecology

## 2023-02-25 ENCOUNTER — Ambulatory Visit
Admission: RE | Admit: 2023-02-25 | Discharge: 2023-02-25 | Disposition: A | Discharge status: Home / Self Care | Payer: Medicare Other | Source: Ambulatory Visit | Attending: Endocrinology | Admitting: Endocrinology

## 2023-02-25 DIAGNOSIS — Z1231 Encounter for screening mammogram for malignant neoplasm of breast: Secondary | ICD-10-CM

## 2023-02-25 DIAGNOSIS — Z01419 Encounter for gynecological examination (general) (routine) without abnormal findings: Secondary | ICD-10-CM | POA: Insufficient documentation

## 2023-02-25 DIAGNOSIS — M818 Other osteoporosis without current pathological fracture: Secondary | ICD-10-CM | POA: Diagnosis not present

## 2023-02-25 DIAGNOSIS — T380X5A Adverse effect of glucocorticoids and synthetic analogues, initial encounter: Secondary | ICD-10-CM | POA: Insufficient documentation

## 2023-02-25 DIAGNOSIS — M8589 Other specified disorders of bone density and structure, multiple sites: Secondary | ICD-10-CM | POA: Diagnosis not present

## 2023-03-02 DIAGNOSIS — F3181 Bipolar II disorder: Secondary | ICD-10-CM | POA: Diagnosis not present

## 2023-03-03 ENCOUNTER — Encounter: Payer: Self-pay | Admitting: Family

## 2023-03-08 ENCOUNTER — Encounter: Payer: Self-pay | Admitting: Family

## 2023-03-10 DIAGNOSIS — H40053 Ocular hypertension, bilateral: Secondary | ICD-10-CM | POA: Diagnosis not present

## 2023-03-18 ENCOUNTER — Encounter: Payer: Self-pay | Admitting: Family

## 2023-03-18 ENCOUNTER — Telehealth: Payer: Self-pay | Admitting: Family

## 2023-03-18 NOTE — Telephone Encounter (Signed)
Noted. Thanks.

## 2023-03-18 NOTE — Telephone Encounter (Signed)
Appointment For: [name] [name] ([id number])  Visit Type: OFFICE VISIT (1004)    03/23/2023   11:30 AM  30 mins.  Allegra Grana, FNP   [facility]    Patient Comments    Possible Giardia. Severe abdominal pain and greater loose stools.

## 2023-03-23 ENCOUNTER — Ambulatory Visit (INDEPENDENT_AMBULATORY_CARE_PROVIDER_SITE_OTHER): Payer: Medicare Other | Admitting: Family

## 2023-03-23 ENCOUNTER — Encounter: Payer: Self-pay | Admitting: Family

## 2023-03-23 VITALS — BP 118/80 | HR 89 | Temp 98.3°F | Ht 70.0 in | Wt 200.0 lb

## 2023-03-23 DIAGNOSIS — E039 Hypothyroidism, unspecified: Secondary | ICD-10-CM | POA: Diagnosis not present

## 2023-03-23 DIAGNOSIS — H409 Unspecified glaucoma: Secondary | ICD-10-CM | POA: Diagnosis not present

## 2023-03-23 DIAGNOSIS — R197 Diarrhea, unspecified: Secondary | ICD-10-CM | POA: Diagnosis not present

## 2023-03-23 DIAGNOSIS — K909 Intestinal malabsorption, unspecified: Secondary | ICD-10-CM | POA: Diagnosis not present

## 2023-03-23 LAB — CBC WITH DIFFERENTIAL/PLATELET
Basophils Absolute: 0 10*3/uL (ref 0.0–0.1)
Basophils Relative: 0.3 % (ref 0.0–3.0)
Eosinophils Absolute: 0.1 10*3/uL (ref 0.0–0.7)
Eosinophils Relative: 2.2 % (ref 0.0–5.0)
HCT: 43.1 % (ref 36.0–46.0)
Hemoglobin: 14.2 g/dL (ref 12.0–15.0)
Lymphocytes Relative: 28.7 % (ref 12.0–46.0)
Lymphs Abs: 1.6 10*3/uL (ref 0.7–4.0)
MCHC: 33.1 g/dL (ref 30.0–36.0)
MCV: 85.7 fl (ref 78.0–100.0)
Monocytes Absolute: 0.3 10*3/uL (ref 0.1–1.0)
Monocytes Relative: 5.3 % (ref 3.0–12.0)
Neutro Abs: 3.6 10*3/uL (ref 1.4–7.7)
Neutrophils Relative %: 63.5 % (ref 43.0–77.0)
Platelets: 181 10*3/uL (ref 150.0–400.0)
RBC: 5.02 Mil/uL (ref 3.87–5.11)
RDW: 14.1 % (ref 11.5–15.5)
WBC: 5.6 10*3/uL (ref 4.0–10.5)

## 2023-03-23 LAB — COMPREHENSIVE METABOLIC PANEL
ALT: 18 U/L (ref 0–35)
AST: 18 U/L (ref 0–37)
Albumin: 4.5 g/dL (ref 3.5–5.2)
Alkaline Phosphatase: 99 U/L (ref 39–117)
BUN: 17 mg/dL (ref 6–23)
CO2: 30 mEq/L (ref 19–32)
Calcium: 9.4 mg/dL (ref 8.4–10.5)
Chloride: 102 mEq/L (ref 96–112)
Creatinine, Ser: 0.86 mg/dL (ref 0.40–1.20)
GFR: 74.47 mL/min (ref >60.00)
Glucose, Bld: 83 mg/dL (ref 70–99)
Potassium: 4.2 mEq/L (ref 3.5–5.1)
Sodium: 142 mEq/L (ref 135–145)
Total Bilirubin: 0.3 mg/dL (ref 0.2–1.2)
Total Protein: 6.9 g/dL (ref 6.0–8.3)

## 2023-03-23 LAB — TSH: TSH: 3.11 u[IU]/mL (ref 0.35–5.50)

## 2023-03-23 NOTE — Assessment & Plan Note (Addendum)
Afebrile.  Nontoxic in appearance.  No dizziness today or with position change to exam table.  Discussed dizziness as it  relates to dehydration and position changes.   generalized abdominal pain which patient and I discussed at length likely related to gastroenteritis and frequency of BM.  Patient had exposure to Giardia through her dog.  Pending ova and parasite, GI pathogen panel, C. difficile screen and ordered stat.  Encouraged continue gentle hydration and to be careful with position changes.  After results of studies, if negative for bacterial viral presence, will advise Imodium.  Patient plans to call to follow-up Dr. Mia Creek to complete previous workup for chronic diarrhea.  Patient will let me know how she is doing

## 2023-03-23 NOTE — Addendum Note (Signed)
Addended by: Warden Fillers on: 03/23/2023 11:48 AM   Modules accepted: Orders

## 2023-03-23 NOTE — Patient Instructions (Addendum)
Please stool tests to [name] [name].   Please continue gentle hydration be very careful with position changes to dizziness you experienced yesterday.  Please start on dizziness persist.  We deferred abdominal imaging today however if abdominal pain were to become worse, please let me know.  Please ensure that you do call Dr. Mart Piggs office to arrange follow-up

## 2023-03-23 NOTE — Progress Notes (Signed)
Assessment & Plan:  Diarrhea due to malabsorption Assessment & Plan: Afebrile.  Nontoxic in appearance.  No dizziness today or with position change to exam table.  Discussed dizziness as it  relates to dehydration and position changes.   generalized abdominal pain which patient and I discussed at length likely related to gastroenteritis and frequency of BM.  Patient had exposure to Giardia through her dog.  Pending ova and parasite, GI pathogen panel, C. difficile screen and ordered stat.  Encouraged continue gentle hydration and to be careful with position changes.  After results of studies, if negative for bacterial viral presence, will advise Imodium.  Patient plans to call to follow-up Dr. Mia [name] to complete previous workup for chronic diarrhea.  Patient will let me know how she is doing  Orders: -     GI pathogen panel by PCR, stool; Future -     C Difficile Quick Screen w PCR reflex; Future -     Giardia/Cryptosporidium EIA; Future -     Comprehensive metabolic panel -     CBC with Differential/Platelet  Hypothyroidism, unspecified type -     TSH  Glaucoma of both eyes, unspecified glaucoma type     Return precautions given.   Risks, benefits, and alternatives of the medications and treatment plan prescribed today were discussed, and patient expressed understanding.   Education regarding symptom management and diagnosis given to patient on AVS either electronically or printed.  No follow-ups on file.  [name] Plowman, FNP  Subjective:    [name], female    DOB: 1966-09-20, 57 y.o.   MRN: [id number]  CC: [name] [name] is a 57 y.o. female who presents today for an acute visit.    HPI: Accompanied by daughter  Complains of diarrhea x 8 days, unchanged.   She describes stool are 'more loose'. She has been taking hydrocodone for abdominal pain which  is generalized.  loose to sludgy stool, 10-12 episodes per day. Endorses nausea.  No  blood in stool, fever, chills, constipation, vomiting, weight loss.   She has been taking pepto bismal Urine is clear.  One episode of dizziness yesterday with standing. No recurrence today.  Denies palpitations, syncope.   Exposure to giardia from her dog who was positive.        She has been taking Bentyl q6hrs, dexilant and zofran.   No recent travel, exposure to unclean water, antibiotic use.   Consult gastroenterology, Dr. Mia [name] 01/13/2023.  She is yet to have gastric emptying study performed her hydrogen breath test.  I politely ordered alpha gal panel as well as B12 Questions of diarrhea versus possible overflow diarrhea from constipation.  The differentials include gastroparesis versus possible alpha gal 01/13/2023 Negative alpha gal  B12 518  Allergies: Bee venom, Fish allergy, Latex, Peanut-containing drug products, Prasterone, Shellfish allergy, Shellfish-derived products, Compazine [prochlorperazine edisylate], Dhea [nutritional supplements], Dilaudid [hydromorphone hcl], Famotidine, Lactose, Lactose intolerance (gi), Other, Prochlorperazine, Promethazine, Promethazine hcl, Reclast [zoledronic acid], Clarithromycin, Clotrimazole, Hydromorphone hcl, and Penicillins Current Outpatient Medications on File Prior to Visit  Medication Sig Dispense Refill   acetaminophen (TYLENOL) 650 MG CR tablet Take 650 mg by mouth every 8 (eight) hours as needed for pain.     albuterol (PROVENTIL) (2.5 MG/3ML) 0.083% nebulizer solution Take 3 mLs (2.5 mg total) by nebulization every 6 (six) hours as needed for wheezing or shortness of breath. 150 mL 12   albuterol (VENTOLIN HFA) 108 (90 Base) MCG/ACT inhaler Inhale 1-2 puffs into  the lungs every 4 (four) hours as needed for wheezing or shortness of breath. 18 g 11   Ascorbic Acid (VITAMIN C) 500 MG CHEW Chew 2,500 mg by mouth daily.     Biotin 5000 MCG CHEW Chew 2 each by mouth daily.     carbidopa-levodopa (SINEMET CR) 50-200 MG tablet Take  1 tablet by mouth 5 (five) times daily.     Cholecalciferol (VITAMIN D3) 2000 units capsule Vitamin D3 2,000 unit capsule     colestipol (COLESTID) 1 g tablet Take 3 tablets (3 g total) by mouth 2 (two) times daily. 180 tablet 1   dexlansoprazole (DEXILANT) 60 MG capsule Take 1 capsule (60 mg total) by mouth daily. 90 capsule 3   diazepam (VALIUM) 5 MG tablet Take two 5 mg tablets 2  times daily     diclofenac sodium (VOLTAREN) 1 % GEL Apply 2 g topically 4 (four) times daily as needed (for knee & finger pain.).      dicyclomine (BENTYL) 20 MG tablet Take 1 tablet (20 mg total) by mouth every 6 (six) hours. (Patient taking differently: Take 20 mg by mouth every 6 (six) hours. Pt is taking 3 times a daily) 120 tablet 2   diphenoxylate-atropine (LOMOTIL) 2.5-0.025 MG tablet Take 1 tablet by mouth 4 (four) times daily as needed.     DULoxetine (CYMBALTA) 60 MG capsule Take 1 capsule (60 mg total) by mouth daily. 90 capsule 1   EPINEPHrine 0.3 mg/0.3 mL IJ SOAJ injection Inject 0.3 mg into the muscle as needed for anaphylaxis. 1 each 2   famotidine (PEPCID) 20 MG tablet Take 1 tablet (20 mg total) by mouth at bedtime. 30 tablet 2   Ferrous Sulfate (IRON PO) Take by mouth.     folic acid (FOLVITE) 1 MG tablet Take 1 tablet (1 mg total) by mouth daily. 90 tablet 3   HYDROcodone-acetaminophen (NORCO/VICODIN) 5-325 MG tablet Take 2x"s daily     hydroxychloroquine (PLAQUENIL) 200 MG tablet Plaquenil 200 mg tablet  Take 1 tablet twice a day by oral route after meals for 90 days.     latanoprost (XALATAN) 0.005 % ophthalmic solution 1 drop at bedtime.     levothyroxine (SYNTHROID) 75 MCG tablet TAKE 1 TABLET BY MOUTH DAILY 30 MINUTES BEFORE BREAKFAST 90 tablet 3   methocarbamol (ROBAXIN) 750 MG tablet Take 1 tablet (750 mg total) by mouth every 6 (six) hours as needed. 120 tablet 5   montelukast (SINGULAIR) 10 MG tablet TAKE 1 TABLET BY MOUTH EVERY NIGHT AT BEDTIME 90 tablet 3   Multiple Vitamins-Minerals  (MULTI-VITAMIN GUMMIES PO) Multi Vitamin  GUMMIES     neomycin-polymyxin-hydrocortisone (CORTISPORIN) OTIC solution Place 3 drops into the right ear 4 (four) times daily. 10 mL 0   ondansetron (ZOFRAN) 4 MG tablet Take 1 tablet (4 mg total) by mouth every 8 (eight) hours as needed for nausea or vomiting. 40 tablet 2   PARoxetine (PAXIL) 10 MG tablet Take 1 tablet (10 mg total) by mouth daily. 90 tablet 0   Polyvinyl Alcohol-Povidone (REFRESH OP) Place 1 drop into both eyes 2 (two) times daily.     Probiotic Product (PROBIOTIC PO) Probiotic 10 billion cell capsule  daily     rizatriptan (MAXALT) 10 MG tablet Take 1 tablet (10 mg total) by mouth as needed for migraine. May repeat in 2 hours if needed. Max dose 20 mg in 1 day 10 tablet 5   Sodium Chloride 3 % AERS Saline Nasal Mist 3 %  spray nose deeply every hour or two and blow nose     topiramate (TOPAMAX) 50 MG tablet Take 150 mg by mouth in the morning and at bedtime.     traZODone (DESYREL) 50 MG tablet Take 1 tablet (50 mg total) by mouth at bedtime. 90 tablet 3   No current facility-administered medications on file prior to visit.    Review of Systems  Constitutional:  Negative for chills and fever.  Respiratory:  Negative for cough.   Cardiovascular:  Negative for chest pain and palpitations.  Gastrointestinal:  Positive for abdominal pain and diarrhea. Negative for blood in stool, constipation, nausea and vomiting.  Genitourinary:  Negative for difficulty urinating.  Neurological:  Negative for dizziness (none today) and syncope.      Objective:    BP 118/80   Pulse 89   Temp 98.3 F (36.8 C) (Oral)   Ht  (1.778 m)   Wt 200 lb (90.7 kg)   LMP 12/15/2015 Comment: neg urine test   SpO2 96%   BMI 28.70 kg/m   BP Readings from Last 3 Encounters:  03/23/23 118/80  02/10/23 122/80  02/01/23 124/84   Wt Readings from Last 3 Encounters:  03/23/23 200 lb (90.7 kg)  02/10/23 196 lb (88.9 kg)  02/01/23 197 lb 3.2 oz  (89.4 kg)    Physical Exam Vitals reviewed.  Constitutional:      Appearance: Normal appearance. She is well-developed.  Eyes:     Conjunctiva/sclera: Conjunctivae normal.  Cardiovascular:     Rate and Rhythm: Normal rate and regular rhythm.     Pulses: Normal pulses.     Heart sounds: Normal heart sounds.  Pulmonary:     Effort: Pulmonary effort is normal.     Breath sounds: Normal breath sounds. No wheezing, rhonchi or rales.  Abdominal:     General: Bowel sounds are normal. There is no distension.     Palpations: Abdomen is soft. Abdomen is not rigid. There is no fluid wave or mass.     Tenderness: There is generalized abdominal tenderness. There is no guarding or rebound.     Comments: Abdomen is soft.  Diffusely tender with palpation.  No focal tenderness.  No rebound  Skin:    General: Skin is warm and dry.  Neurological:     Mental Status: She is alert.  Psychiatric:        Speech: Speech normal.        Behavior: Behavior normal.        Thought Content: Thought content normal.

## 2023-03-24 ENCOUNTER — Other Ambulatory Visit
Admission: RE | Admit: 2023-03-24 | Discharge: 2023-03-24 | Disposition: A | Discharge status: Home / Self Care | Payer: Medicare Other | Attending: Family | Admitting: Family

## 2023-03-24 ENCOUNTER — Encounter: Payer: Self-pay | Admitting: Family

## 2023-03-24 DIAGNOSIS — R197 Diarrhea, unspecified: Secondary | ICD-10-CM | POA: Diagnosis not present

## 2023-03-24 DIAGNOSIS — R634 Abnormal weight loss: Secondary | ICD-10-CM | POA: Diagnosis not present

## 2023-03-24 DIAGNOSIS — K909 Intestinal malabsorption, unspecified: Secondary | ICD-10-CM | POA: Diagnosis not present

## 2023-03-24 LAB — C DIFFICILE QUICK SCREEN W PCR REFLEX
C Diff antigen: NEGATIVE
C Diff interpretation: NOT DETECTED
C Diff toxin: NEGATIVE

## 2023-03-25 ENCOUNTER — Encounter: Payer: Self-pay | Admitting: Family

## 2023-03-25 LAB — GIARDIA/CRYPTOSPORIDIUM EIA
Cryptosporidium EIA: NEGATIVE
Giardia Ag, Stl: NEGATIVE

## 2023-03-26 ENCOUNTER — Telehealth: Payer: Self-pay | Admitting: Family

## 2023-03-26 ENCOUNTER — Telehealth: Payer: Self-pay

## 2023-03-26 NOTE — Telephone Encounter (Signed)
toya, I ordered ova and parasite stool test. I do not see this was collected?  Can this be added on?

## 2023-03-26 NOTE — Telephone Encounter (Signed)
Per [facility] lab: THE RESULTS ARE IN, THAT TEST GOES TO [facility].    They are going to fax over the results. Should be in e-fax by end of day. Will route to [facility] to keep an eye out for it.

## 2023-03-26 NOTE — Telephone Encounter (Signed)
Printed off and placed on providers desk

## 2023-03-26 NOTE — Telephone Encounter (Signed)
Pt is aware and gave a verbal understanding.  

## 2023-03-26 NOTE — Telephone Encounter (Signed)
Please let the patient know that her C. difficile and Giardia testing are negative.  Some of her other stool tests are still pending.  We will contact the patient when those return.

## 2023-03-26 NOTE — Telephone Encounter (Signed)
Date/Time Action Taken User Additional Information  03/24/23 1142 Release Lonia Farber From [id number]    It was released on 03/24/23, I'll have to call and check on it

## 2023-03-26 NOTE — Telephone Encounter (Signed)
Received a fax from [facility] lab with the results of stool test. All results that have been completed are in Epic.

## 2023-03-28 ENCOUNTER — Encounter: Payer: Self-pay | Admitting: Family

## 2023-03-28 LAB — GI PATHOGEN PANEL BY PCR, STOOL

## 2023-03-28 LAB — STOOL CULTURE: E coli, Shiga toxin Assay: NEGATIVE

## 2023-03-28 LAB — STOOL CULTURE REFLEX - RSASHR

## 2023-03-28 LAB — STOOL CULTURE REFLEX - CMPCXR

## 2023-03-29 LAB — CALPROTECTIN, FECAL: Calprotectin, Fecal: 27 ug/g (ref 0–120)

## 2023-03-30 ENCOUNTER — Telehealth: Payer: Self-pay | Admitting: Family

## 2023-03-30 ENCOUNTER — Encounter: Payer: Self-pay | Admitting: Family

## 2023-03-30 ENCOUNTER — Other Ambulatory Visit: Payer: Self-pay | Admitting: Family

## 2023-03-30 NOTE — Telephone Encounter (Signed)
Please call dr Mia Creek at [facility]  I am not sure how often he gets on epic   Ask to speak to his nurse and ensure he knows I have sent him a staff message  I sent him the below staff message. Please offer to fax to him if his nurse needs.      Dr Mia Creek,   [facility] you are doing well.   Ms Murriel Hopper recently established with you for ongoing abdominal pain, diarrhea.  I do not think she did not return for gastric emptying study.  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   I have ordered stool cultures, C. difficile, GI pathogen panel, fecal calprotectin, ova and parasite ( twice, awaiting last O & P study) all with reassuring results.   She has yet to have GM gastric emptying study.   She has f/u with you in September.   I am concerned regarding her diarrhea and not sure what other tests to order. I cannot find a stool culture or PCR for  B, cereus in Epic. Do you know if B Cereus would show up in a stool culture?  Do you have availability to see her sooner? Can your office call her to arrange gastric emptying study?   Regards,   Ilithyia Titzer    I included patient's message below.   Hi. Sorry to bother you again. 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 It is a bacteria that is found in starchy foods especially rice and potatoes. It causes vomiting and diarrhea. I have been eating microwaved rice since the start of my illness in July-August. I eat it twice daily. Even though I told my mom not to heat my food in the microwave a thousand times she still does it. She also lets the rice cool down to room temperature once she takes it out of the refrigerator. 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 I told her to stop again a thousand times but she still does it. Anyway I don't know if you tested for this bacteria. I don't know if I should ask the G.I. doctor or would you test for it. I'm tired of being sick. As I  told you in a previous message I'm getting worse. Please let me know what you think. Thanks. Judit

## 2023-03-31 NOTE — Telephone Encounter (Signed)
[name] [name] clinical  Dr Mia Creek sent me a staff message   May disregard   You do NOT need to call [facility]

## 2023-03-31 NOTE — Telephone Encounter (Signed)
NOTED

## 2023-04-01 DIAGNOSIS — F419 Anxiety disorder, unspecified: Secondary | ICD-10-CM | POA: Diagnosis not present

## 2023-04-01 DIAGNOSIS — F3161 Bipolar disorder, current episode mixed, mild: Secondary | ICD-10-CM | POA: Diagnosis not present

## 2023-04-06 ENCOUNTER — Other Ambulatory Visit: Payer: Self-pay | Admitting: Gastroenterology

## 2023-04-06 DIAGNOSIS — R1084 Generalized abdominal pain: Secondary | ICD-10-CM

## 2023-04-07 DIAGNOSIS — R195 Other fecal abnormalities: Secondary | ICD-10-CM | POA: Diagnosis not present

## 2023-04-11 ENCOUNTER — Emergency Department
Admission: EM | Admit: 2023-04-11 | Discharge: 2023-04-12 | Disposition: A | Discharge status: Home / Self Care | Payer: Medicare Other | Attending: Emergency Medicine | Admitting: Emergency Medicine

## 2023-04-11 ENCOUNTER — Emergency Department: Payer: Medicare Other

## 2023-04-11 ENCOUNTER — Other Ambulatory Visit: Payer: Self-pay

## 2023-04-11 ENCOUNTER — Encounter: Payer: Self-pay | Admitting: Emergency Medicine

## 2023-04-11 DIAGNOSIS — R1032 Left lower quadrant pain: Secondary | ICD-10-CM | POA: Diagnosis not present

## 2023-04-11 DIAGNOSIS — R202 Paresthesia of skin: Secondary | ICD-10-CM | POA: Diagnosis not present

## 2023-04-11 DIAGNOSIS — R200 Anesthesia of skin: Secondary | ICD-10-CM | POA: Diagnosis not present

## 2023-04-11 DIAGNOSIS — R109 Unspecified abdominal pain: Secondary | ICD-10-CM | POA: Insufficient documentation

## 2023-04-11 LAB — URINALYSIS, ROUTINE W REFLEX MICROSCOPIC
Bilirubin Urine: NEGATIVE
Glucose, UA: NEGATIVE mg/dL
Ketones, ur: NEGATIVE mg/dL
Nitrite: NEGATIVE
Protein, ur: 30 mg/dL — AB
Specific Gravity, Urine: 1.02 (ref 1.005–1.030)
pH: 5.5 (ref 5.0–8.0)

## 2023-04-11 LAB — COMPREHENSIVE METABOLIC PANEL
ALT: 20 U/L (ref 0–44)
AST: 22 U/L (ref 15–41)
Albumin: 4.5 g/dL (ref 3.5–5.0)
Alkaline Phosphatase: 91 U/L (ref 38–126)
Anion gap: 11 (ref 5–15)
BUN: 14 mg/dL (ref 6–20)
CO2: 27 mmol/L (ref 22–32)
Calcium: 9.2 mg/dL (ref 8.9–10.3)
Chloride: 102 mmol/L (ref 98–111)
Creatinine, Ser: 0.91 mg/dL (ref 0.44–1.00)
GFR, Estimated: >60 mL/min (ref >60)
Glucose, Bld: 86 mg/dL (ref 70–99)
Potassium: 4.3 mmol/L (ref 3.5–5.1)
Sodium: 140 mmol/L (ref 135–145)
Total Bilirubin: 0.6 mg/dL (ref 0.3–1.2)
Total Protein: 7.3 g/dL (ref 6.5–8.1)

## 2023-04-11 LAB — CBC
HCT: 44 % (ref 36.0–46.0)
Hemoglobin: 14.4 g/dL (ref 12.0–15.0)
MCH: 28.2 pg (ref 26.0–34.0)
MCHC: 32.7 g/dL (ref 30.0–36.0)
MCV: 86.1 fL (ref 80.0–100.0)
Platelets: 165 10*3/uL (ref 150–400)
RBC: 5.11 MIL/uL (ref 3.87–5.11)
RDW: 13.2 % (ref 11.5–15.5)
WBC: 5.6 10*3/uL (ref 4.0–10.5)
nRBC: 0 % (ref 0.0–0.2)

## 2023-04-11 LAB — LIPASE, BLOOD: Lipase: 33 U/L (ref 11–51)

## 2023-04-11 MED ORDER — SODIUM CHLORIDE 0.9 % IV BOLUS
1000.0000 mL | Freq: Once | INTRAVENOUS | Status: AC
  Administered 2023-04-11: 1000 mL via INTRAVENOUS

## 2023-04-11 MED ORDER — HALOPERIDOL LACTATE 5 MG/ML IJ SOLN
5.0000 mg | Freq: Once | INTRAMUSCULAR | Status: AC
  Administered 2023-04-11: 5 mg via INTRAVENOUS
  Filled 2023-04-11: qty 1

## 2023-04-11 NOTE — ED Triage Notes (Signed)
Upper Abd pain and LLQ x 2 weeks. Also complease of dizziness and left arm numbness. Nausea and diarrhea since July 2023.

## 2023-04-11 NOTE — ED Provider Triage Note (Signed)
Emergency Medicine Provider Triage Evaluation Note  Sharmila Vianca Gardenhire , a 57 y.o. female  was evaluated in triage.  Pt complains of upper abdominal pain and left lower quadrant pain x 2 weeks, diarrhea for the past 9 months. She has developed tingling in her hand as well. She states GI specialist advised her to come to the ER.      Physical Exam  BP (!) 156/92 (BP Location: Left Arm)   Pulse 94   Temp 98.5 F (36.9 C) (Oral)   Resp 17   Ht 5\' 10"  (1.778 m)   Wt 90.7 kg   LMP 12/15/2015 Comment: neg urine test   SpO2 98%   BMI 28.70 kg/m  Gen:   Awake, no distress   Resp:  Normal effort  MSK:   Moves extremities without difficulty  Other:    Medical Decision Making  Medically screening exam initiated at 4:33 PM.  Appropriate orders placed.  Kelse Yaretzie Nerison was informed that the remainder of the evaluation will be completed by another provider, this initial triage assessment does not replace that evaluation, and the importance of remaining in the ED until their evaluation is complete.     Chinita Pester, FNP 04/11/23 1636

## 2023-04-11 NOTE — ED Provider Notes (Signed)
[facility] Provider Note    Event Date/Time   First MD Initiated Contact with Patient 04/11/23 2021     (approximate)   History   Abdominal pain numbness  HPI  [name] [name] is a 57 y.o. female who presents to the emergency department today because of concerns for abdominal pain and numbness.  Her abdominal pain has been going on for some time.  She is seen by GI.  She has had colonoscopies and CT scans done in the past.  This has been accompanied by headaches in the past.  Today however she started to notice some numbness in her lips and left hand.  This is a new symptom for the patient.  She called her GI doctor's office who recommended she come to the emergency department for further evaluation.     Physical Exam   Triage Vital Signs: ED Triage Vitals  Enc Vitals Group     BP 04/11/23 1626 (!) 156/92     Pulse Rate 04/11/23 1626 94     Resp 04/11/23 1626 17     Temp 04/11/23 1626 98.5 F (36.9 C)     Temp Source 04/11/23 1626 Oral     SpO2 04/11/23 1626 98 %     Weight 04/11/23 1631 200 lb (90.7 kg)     Height 04/11/23 1631 5\' 10"  (1.778 m)     Head Circumference --      Peak Flow --      Pain Score 04/11/23 1631 8     Pain Loc --      Pain Edu? --      Excl. in GC? --     Most recent vital signs: Vitals:   04/11/23 1626  BP: (!) 156/92  Pulse: 94  Resp: 17  Temp: 98.5 F (36.9 C)  SpO2: 98%   General: Awake, alert, oriented. CV:  Good peripheral perfusion. Regular rate and rhythm. Resp:  Normal effort. Lungs clear. Abd:  No distention. Diffusely tender to palpation.   ED Results / Procedures / Treatments   Labs (all labs ordered are listed, but only abnormal results are displayed) Labs Reviewed  URINALYSIS, ROUTINE W REFLEX MICROSCOPIC - Abnormal; Notable for the following components:      Result Value   Color, Urine YELLOW (*)    APPearance CLEAR (*)    Hgb urine dipstick TRACE (*)    Protein, ur 30 (*)     Leukocytes,Ua TRACE (*)    Bacteria, UA RARE (*)    All other components within normal limits  LIPASE, BLOOD  COMPREHENSIVE METABOLIC PANEL  CBC     EKG  I, Phineas Semen, attending physician, personally viewed and interpreted this EKG  EKG Time: 1621 Rate: 82 Rhythm: normal sinus rhythm Axis: normal Intervals: qtc 432 QRS: narrow ST changes: no st elevation Impression: normal ekg   RADIOLOGY MR pending at time of sign out  PROCEDURES:  Critical Care performed: No    MEDICATIONS ORDERED IN ED: Medications - No data to display   IMPRESSION / MDM / ASSESSMENT AND PLAN / ED COURSE  I reviewed the triage vital signs and the nursing notes.                              Differential diagnosis includes, but is not limited to, complex migraine, electrolyte abnormality, peripheral neuropathy  Patient's presentation is most consistent with acute presentation with potential threat  to life or bodily function.  Patient presented to the emergency department today because of concerns for abdominal pain, headache and left hand numbness and oral numbness.  On exam patient is tender in the abdomen.  No focal neurodeficits.  Did have high suspicion for possible complex migraine.  Patient was given IV fluids and medication and while her headache and abdominal pain did improve she continued to complain of left arm numbness and oral numbness.  While I have low suspicion for CVA we will check MRI to evaluate for any intracranial abnormalities. If negative and patient otherwise continues to feel improvement do think it would be reasonable for patient to be discharged.    FINAL CLINICAL IMPRESSION(S) / ED DIAGNOSES   Final diagnoses:  Abdominal pain, unspecified abdominal location  Numbness    Note:  This document was prepared using Dragon voice recognition software and may include unintentional dictation errors.    Phineas Semen, MD 04/11/23 [phone]

## 2023-04-12 ENCOUNTER — Ambulatory Visit: Payer: Medicare Other | Admitting: Family

## 2023-04-12 NOTE — ED Provider Notes (Signed)
Patient received in signout from Dr. Derrill Kay.  She reports resolution of symptoms and feeling much better after Haldol.  MRI is normal.  We will discharge per original plan of care.   Delton Prairie, MD 04/12/23 [phone]

## 2023-04-13 ENCOUNTER — Encounter: Payer: Self-pay | Admitting: Family

## 2023-04-14 ENCOUNTER — Encounter: Payer: Self-pay | Admitting: Family

## 2023-04-14 DIAGNOSIS — M810 Age-related osteoporosis without current pathological fracture: Secondary | ICD-10-CM | POA: Diagnosis not present

## 2023-04-15 ENCOUNTER — Telehealth: Payer: Self-pay

## 2023-04-15 NOTE — Transitions of Care (Post Inpatient/ED Visit) (Signed)
Unable to reach pt by phone and left v/m requesting pt to call [phone].      04/15/2023  Name: [name] [name] MRN: [id number] DOB: 1966-06-30  Today's TOC FU Call Status: Today's TOC FU Call Status:: Unsuccessul Call (1st Attempt) Unsuccessful Call (1st Attempt) Date: 04/15/23  Attempted to reach the patient regarding the most recent Inpatient/ED visit.  Follow Up Plan: Additional outreach attempts will be made to reach the patient to complete the Transitions of Care (Post Inpatient/ED visit) call.   Signature Lewanda Rife, LPN

## 2023-04-15 NOTE — Telephone Encounter (Signed)
Stool kit placed upfront for pt to pick up. Pt was notified. Pt will pick up next week.

## 2023-04-16 NOTE — Transitions of Care (Post Inpatient/ED Visit) (Signed)
Unable to reach pt by phone and left v/m requesting pt call [phone].     04/16/2023  Name: [name] [name] MRN: [id number] DOB: [birth year]  Today's TOC FU Call Status: Today's TOC FU Call Status:: Unsuccessul Call (1st Attempt) Unsuccessful Call (1st Attempt) Date: 04/15/23  Attempted to reach the patient regarding the most recent Inpatient/ED visit.  Follow Up Plan: Additional outreach attempts will be made to reach the patient to complete the Transitions of Care (Post Inpatient/ED visit) call.   Signature Lewanda Rife, LPN

## 2023-04-23 ENCOUNTER — Other Ambulatory Visit: Payer: Medicare Other

## 2023-04-26 DIAGNOSIS — M25532 Pain in left wrist: Secondary | ICD-10-CM | POA: Diagnosis not present

## 2023-04-26 DIAGNOSIS — Z79891 Long term (current) use of opiate analgesic: Secondary | ICD-10-CM | POA: Diagnosis not present

## 2023-04-26 DIAGNOSIS — M545 Low back pain, unspecified: Secondary | ICD-10-CM | POA: Diagnosis not present

## 2023-04-26 DIAGNOSIS — M25551 Pain in right hip: Secondary | ICD-10-CM | POA: Diagnosis not present

## 2023-04-26 DIAGNOSIS — M25531 Pain in right wrist: Secondary | ICD-10-CM | POA: Diagnosis not present

## 2023-04-30 DIAGNOSIS — H40053 Ocular hypertension, bilateral: Secondary | ICD-10-CM | POA: Diagnosis not present

## 2023-04-30 DIAGNOSIS — Z79899 Other long term (current) drug therapy: Secondary | ICD-10-CM | POA: Diagnosis not present

## 2023-04-30 DIAGNOSIS — H16223 Keratoconjunctivitis sicca, not specified as Sjogren's, bilateral: Secondary | ICD-10-CM | POA: Diagnosis not present

## 2023-04-30 IMAGING — MG DIGITAL DIAGNOSTIC BILAT W/ TOMO W/ CAD
8 series · 8 of 24 positions shown · non-contrast
Comparison: Prior films

CLINICAL DATA: Diffuse left breast pain

EXAM:
DIGITAL DIAGNOSTIC BILATERAL MAMMOGRAM WITH TOMOSYNTHESIS AND CAD
TECHNIQUE: Bilateral digital diagnostic mammography and breast tomosynthesis
was performed. The images were evaluated with computer-aided
detection.

[L CC synth-2D]
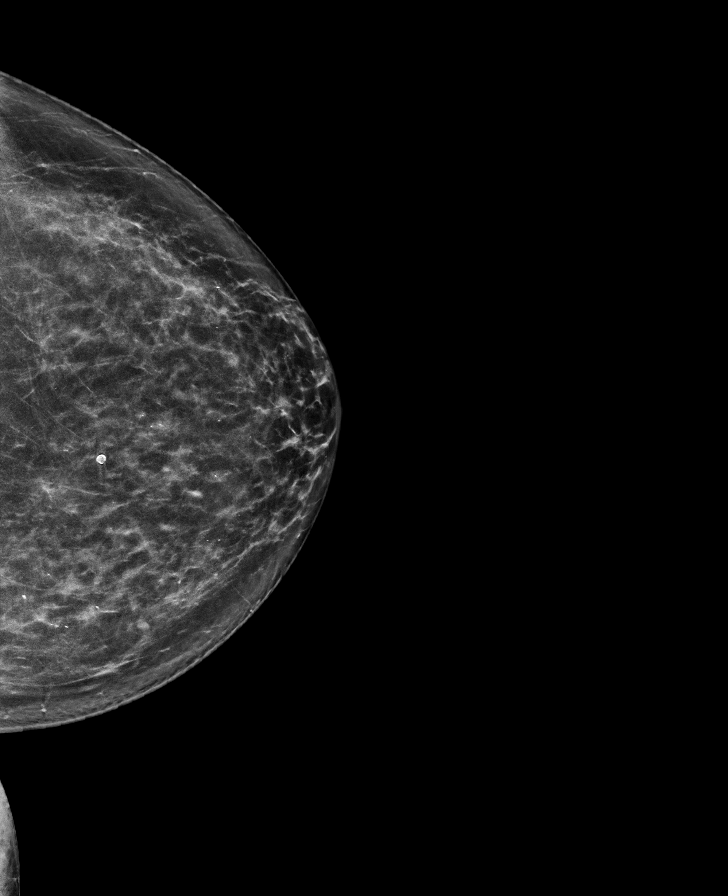

[R MLO synth-2D]
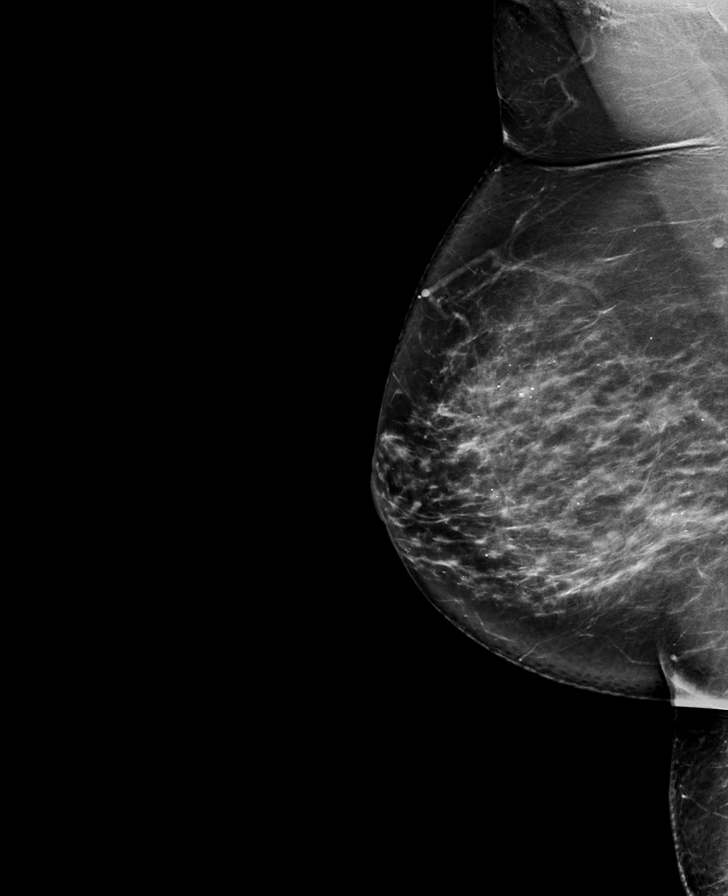

[R CC synth-2D]
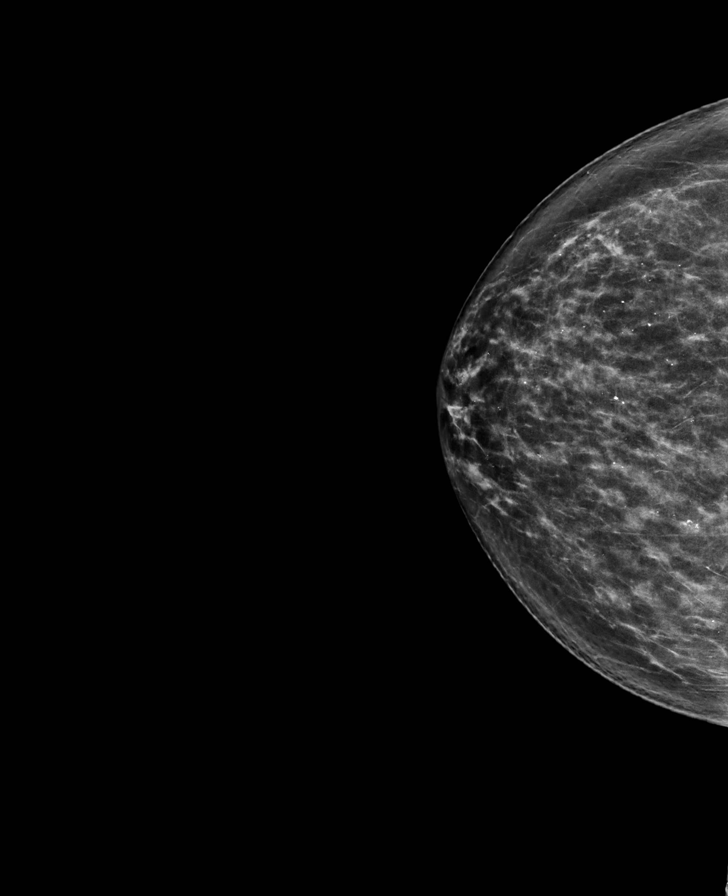

[L MLO synth-2D]
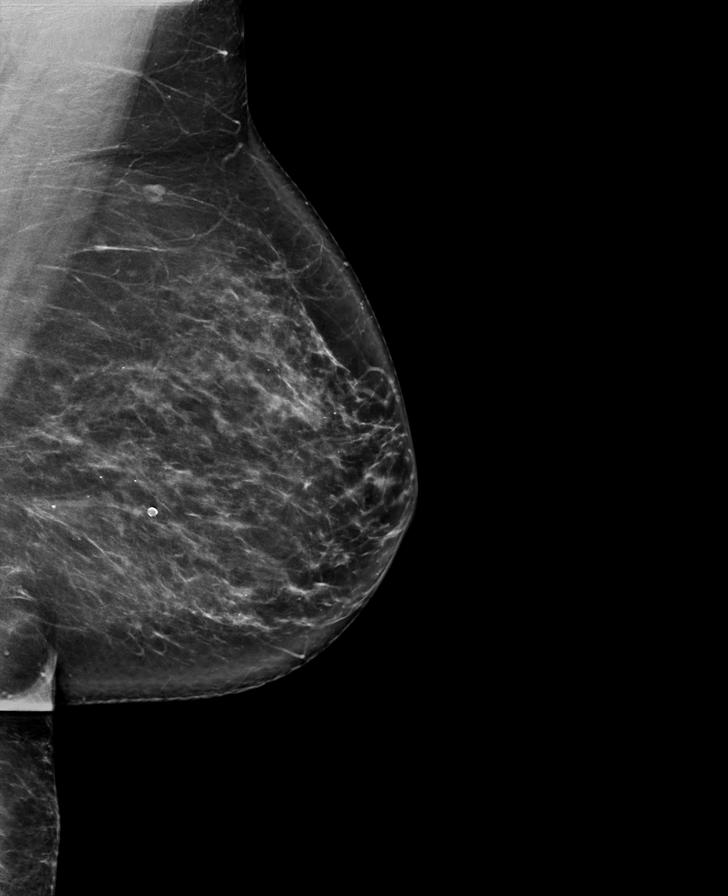

[L CC tomo · tomo slice 42/83.0]
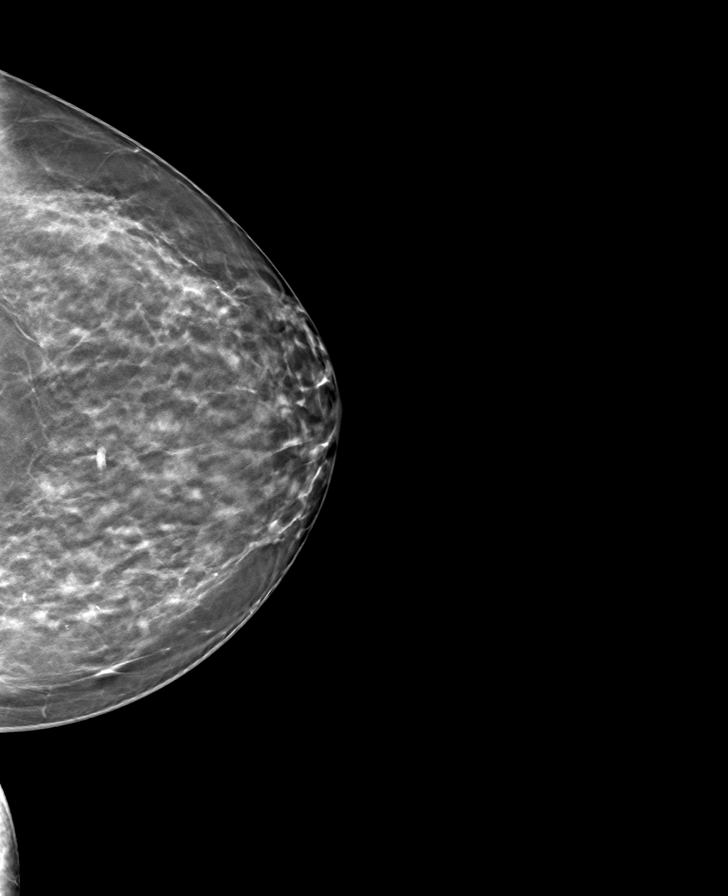

[R MLO tomo · tomo slice 47/92.0]
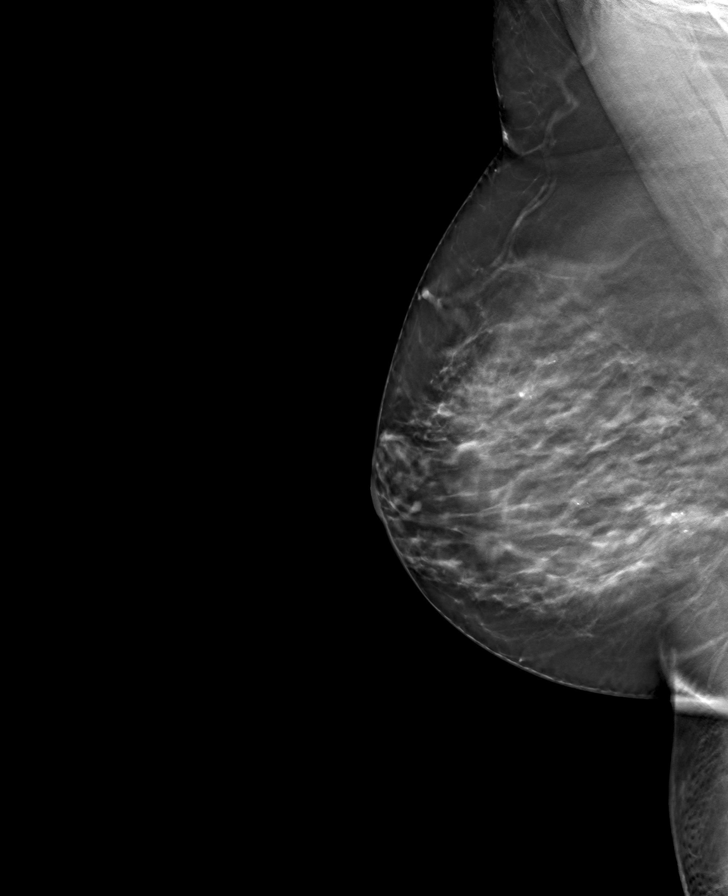

[R CC tomo · tomo slice 41/81.0]
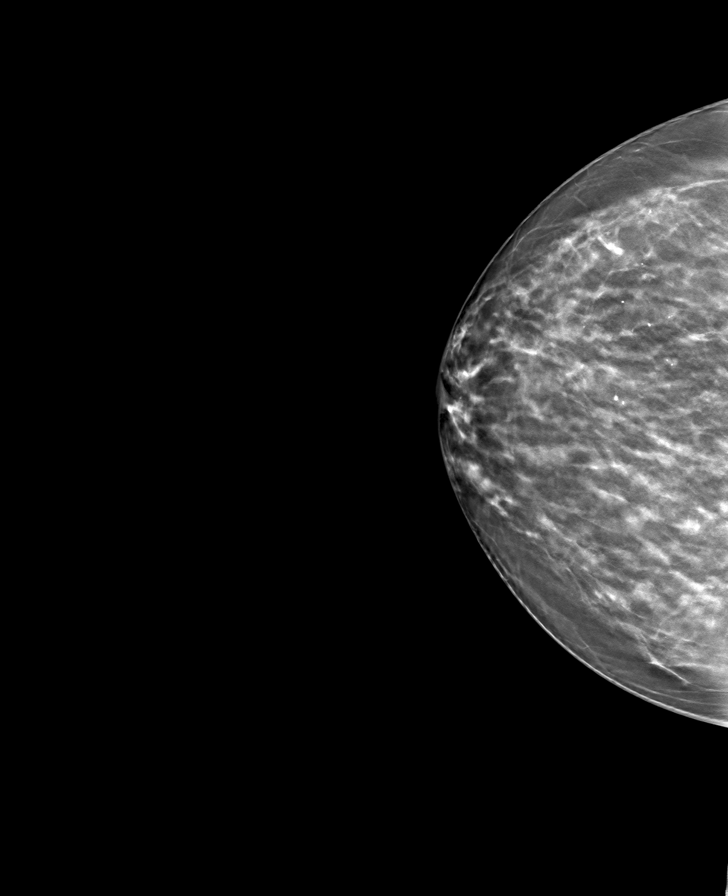

[L MLO tomo · tomo slice 45/89.0]
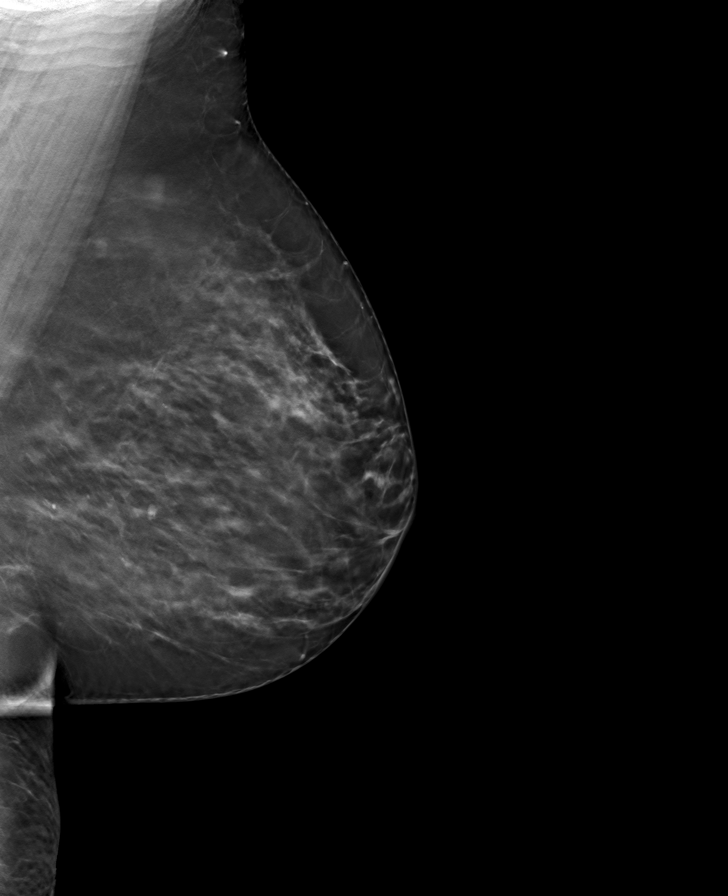

[8 of 24 positions shown; findings below may reference images not displayed]

ACR Breast Density Category c: The breast tissue is heterogeneously
dense, which may obscure small masses.
FINDINGS: Cc and MLO views of bilateral breasts are submitted. No suspicious
abnormalities identified bilaterally.
IMPRESSION: Negative.

RECOMMENDATION:
Routine screening mammogram in 1 year.

I have discussed the findings and recommendations with the patient.
If applicable, a reminder letter will be sent to the patient
regarding the next appointment.

BI-RADS CATEGORY  1: Negative.

## 2023-05-03 ENCOUNTER — Encounter: Payer: Self-pay | Admitting: Family

## 2023-05-03 ENCOUNTER — Ambulatory Visit (INDEPENDENT_AMBULATORY_CARE_PROVIDER_SITE_OTHER): Payer: Medicare Other | Admitting: Family

## 2023-05-03 VITALS — BP 128/82 | HR 81 | Temp 98.0°F | Ht 69.0 in | Wt 203.0 lb

## 2023-05-03 DIAGNOSIS — R42 Dizziness and giddiness: Secondary | ICD-10-CM

## 2023-05-03 DIAGNOSIS — R1084 Generalized abdominal pain: Secondary | ICD-10-CM

## 2023-05-03 MED ORDER — MECLIZINE HCL 12.5 MG PO TABS
12.5000 mg | ORAL_TABLET | Freq: Two times a day (BID) | ORAL | 1 refills | Status: AC | PRN
Start: 2023-05-03 — End: ?

## 2023-05-03 NOTE — Progress Notes (Signed)
Assessment & Plan:  Generalized abdominal pain Assessment & Plan: Patient is afebrile.  Differential continues to include IBS- constipation, constipation.  Symptoms raise concern for gastroparesis.  Pending abdominal x-ray today.  We jointly agreed not to pursue CT abdomen pelvis .  I reached out to Dr. Mia Creek in regards to ordering gastric emptying study and sooner appointment.  Per his telephone call with patient last month, the plan was to cancel GES study so patient could take MiraLAX /Gatorade.  Encouraged patient to take Bentyl every 6 hours.  I also encouraged her to start MiraLAX, Gatorade as recommended by Dr. Mia Creek for underlying constipation.  Orders: -     DG Abd 2 Views; Future  Vertigo Assessment & Plan: Reassuring neurologic exam.  MRI of the brain without acute findings 04/11/2023.  Pending ultrasound carotid arteries.    She is not orthostatic ( see flow sheet).  Unsure if vertigo is related to migraine, abdominal pain.  Consider referral for PT vestibular rehab.  Trial of meclizine today.  Orders: -     US Carotid Bilateral; Future -     Meclizine HCl; Take 1 tablet (12.5 mg total) by mouth 2 (two) times daily as needed for dizziness.  Dispense: 30 tablet; Refill: 1     Return precautions given.   Risks, benefits, and alternatives of the medications and treatment plan prescribed today were discussed, and patient expressed understanding.   Education regarding symptom management and diagnosis given to patient on AVS either electronically or printed.  No follow-ups on file.  Rennie Plowman, FNP  Subjective:    [name], female    DOB: 1966-08-05, 57 y.o.   MRN: [id number]  CC: [name] [name] is a 57 y.o. female who presents today for follow up.   HPI: Continues to complains of episodes of intense diffuse abdominal pain with associated nausea, loose brown stool.   She takes zofran, bentyl with intermittent  relief.  Abdominal pain will improve after BM.   She hasn't taken  miralax and gatorade as recommended by Dr. Mia Creek as she has had appointments which limit her from starting miralax.    She didn't have gastric empyting study. She has had plain film abdominal XR 04/07/23.    Complains of dizziness over the past 11 months, worsening. Dizziness occurs with sitting, walking, or laying down.  Associated with room spinning.  No syncope, CP, palpations, tinnitus, pulsatile tinnitus, hearing loss, fever, dysuria   Seen  in ED 04/11/23 for dizziness, abdominal pain. MR brain without acute findings 04/11/23   She has never taken meclizine.   Dr Park Breed prescribes valium for abdominal pain and migraine.   H/o migraine  History of cholecystectomy, appendectomy  Allergies: Bee venom, Fish allergy, Latex, Peanut-containing drug products, Prasterone, Shellfish allergy, Shellfish-derived products, Compazine [prochlorperazine edisylate], Dhea [nutritional supplements], Dilaudid [hydromorphone hcl], Famotidine, Lactose, Lactose intolerance (gi), Other, Prochlorperazine, Promethazine, Promethazine hcl, Reclast [zoledronic acid], Clarithromycin, Clotrimazole, Hydromorphone hcl, and Penicillins Current Outpatient Medications on File Prior to Visit  Medication Sig Dispense Refill   acetaminophen (TYLENOL) 650 MG CR tablet Take 650 mg by mouth every 8 (eight) hours as needed for pain.     albuterol (PROVENTIL) (2.5 MG/3ML) 0.083% nebulizer solution Take 3 mLs (2.5 mg total) by nebulization every 6 (six) hours as needed for wheezing or shortness of breath. 150 mL 12   albuterol (VENTOLIN HFA) 108 (90 Base) MCG/ACT inhaler Inhale 1-2 puffs into the lungs every 4 (four) hours as needed for  wheezing or shortness of breath. 18 g 11   Ascorbic Acid (VITAMIN C) 500 MG CHEW Chew 2,500 mg by mouth daily.     Biotin 5000 MCG CHEW Chew 2 each by mouth daily.     carbidopa-levodopa (SINEMET CR) 50-200 MG tablet Take 1  tablet by mouth 5 (five) times daily.     Cholecalciferol (VITAMIN D3) 2000 units capsule Vitamin D3 2,000 unit capsule     colestipol (COLESTID) 1 g tablet Take 3 tablets (3 g total) by mouth 2 (two) times daily. 180 tablet 1   dexlansoprazole (DEXILANT) 60 MG capsule Take 1 capsule (60 mg total) by mouth daily. 90 capsule 3   diazepam (VALIUM) 5 MG tablet Take two 5 mg tablets 2  times daily     diclofenac sodium (VOLTAREN) 1 % GEL Apply 2 g topically 4 (four) times daily as needed (for knee & finger pain.).      dicyclomine (BENTYL) 20 MG tablet Take 1 tablet (20 mg total) by mouth every 6 (six) hours. (Patient taking differently: Take 20 mg by mouth every 6 (six) hours. Pt is taking 3 times a daily) 120 tablet 2   diphenoxylate-atropine (LOMOTIL) 2.5-0.025 MG tablet Take 1 tablet by mouth 4 (four) times daily as needed.     DULoxetine (CYMBALTA) 60 MG capsule Take 1 capsule (60 mg total) by mouth daily. 90 capsule 1   EPINEPHrine 0.3 mg/0.3 mL IJ SOAJ injection Inject 0.3 mg into the muscle as needed for anaphylaxis. 1 each 2   famotidine (PEPCID) 20 MG tablet Take 1 tablet (20 mg total) by mouth at bedtime. 30 tablet 2   Ferrous Sulfate (IRON PO) Take by mouth.     folic acid (FOLVITE) 1 MG tablet Take 1 tablet (1 mg total) by mouth daily. 90 tablet 3   HYDROcodone-acetaminophen (NORCO/VICODIN) 5-325 MG tablet Take 2x"s daily     hydroxychloroquine (PLAQUENIL) 200 MG tablet Plaquenil 200 mg tablet  Take 1 tablet twice a day by oral route after meals for 90 days.     latanoprost (XALATAN) 0.005 % ophthalmic solution 1 drop at bedtime.     levothyroxine (SYNTHROID) 75 MCG tablet TAKE 1 TABLET BY MOUTH DAILY 30 MINUTES BEFORE BREAKFAST 90 tablet 3   methocarbamol (ROBAXIN) 750 MG tablet Take 1 tablet (750 mg total) by mouth every 6 (six) hours as needed. 120 tablet 5   montelukast (SINGULAIR) 10 MG tablet TAKE 1 TABLET BY MOUTH EVERY NIGHT AT BEDTIME 90 tablet 3   Multiple Vitamins-Minerals  (MULTI-VITAMIN GUMMIES PO) Multi Vitamin  GUMMIES     neomycin-polymyxin-hydrocortisone (CORTISPORIN) OTIC solution Place 3 drops into the right ear 4 (four) times daily. 10 mL 0   ondansetron (ZOFRAN) 4 MG tablet Take 1 tablet (4 mg total) by mouth every 8 (eight) hours as needed for nausea or vomiting. 40 tablet 2   PARoxetine (PAXIL) 10 MG tablet Take 1 tablet (10 mg total) by mouth daily. 90 tablet 0   Polyvinyl Alcohol-Povidone (REFRESH OP) Place 1 drop into both eyes 2 (two) times daily.     Probiotic Product (PROBIOTIC PO) Probiotic 10 billion cell capsule  daily     rizatriptan (MAXALT) 10 MG tablet Take 1 tablet (10 mg total) by mouth as needed for migraine. May repeat in 2 hours if needed. Max dose 20 mg in 1 day 10 tablet 5   Sodium Chloride 3 % AERS Saline Nasal Mist 3 %  spray nose deeply every hour or two and  blow nose     topiramate (TOPAMAX) 50 MG tablet Take 150 mg by mouth in the morning and at bedtime.     traZODone (DESYREL) 50 MG tablet Take 1 tablet (50 mg total) by mouth at bedtime. 90 tablet 3   No current facility-administered medications on file prior to visit.    Review of Systems  Constitutional:  Negative for chills, fever and unexpected weight change.  Respiratory:  Negative for cough.   Cardiovascular:  Negative for chest pain and palpitations.  Gastrointestinal:  Positive for abdominal pain and diarrhea (loose stool). Negative for nausea and vomiting.  Neurological:  Positive for dizziness and headaches. Negative for syncope.      Objective:    BP 128/82   Pulse 81   Temp 98 F (36.7 C) (Oral)   Ht 5\' 9"  (1.753 m)   Wt 203 lb (92.1 kg)   LMP 12/15/2015 Comment: neg urine test   SpO2 97%   BMI 29.98 kg/m  BP Readings from Last 3 Encounters:  05/03/23 128/82  04/12/23 121/67  03/23/23 118/80   Wt Readings from Last 3 Encounters:  05/03/23 203 lb (92.1 kg)  04/11/23 200 lb (90.7 kg)  03/23/23 200 lb (90.7 kg)   Orthostatic VS for the past 24  hrs (Last 3 readings):  BP- Lying Pulse- Lying BP- Sitting Pulse- Sitting BP- Standing at 0 minutes Pulse- Standing at 0 minutes  05/03/23 1015 130/78 74 128/76 85 138/82 76      Physical Exam Vitals reviewed.  Constitutional:      Appearance: Normal appearance. She is well-developed.  HENT:     Head: Normocephalic and atraumatic.     Right Ear: Hearing, tympanic membrane, ear canal and external ear normal. No swelling or tenderness. No middle ear effusion. Tympanic membrane is not erythematous or bulging.     Left Ear: Tympanic membrane, ear canal and external ear normal. No swelling or tenderness.  No middle ear effusion. Tympanic membrane is not erythematous or bulging.     Nose: Nose normal. No rhinorrhea.     Right Sinus: No maxillary sinus tenderness or frontal sinus tenderness.     Left Sinus: No maxillary sinus tenderness or frontal sinus tenderness.     Mouth/Throat:     Pharynx: Uvula midline. No posterior oropharyngeal erythema.  Eyes:     General: Lids are normal. Lids are everted, no foreign bodies appreciated.     Conjunctiva/sclera: Conjunctivae normal.     Pupils: Pupils are equal, round, and reactive to light.     Comments: Normal fundus bilaterally   Neck:     Vascular: No carotid bruit.  Cardiovascular:     Rate and Rhythm: Normal rate and regular rhythm.     Pulses: Normal pulses.     Heart sounds: Normal heart sounds.  Pulmonary:     Effort: Pulmonary effort is normal.     Breath sounds: Normal breath sounds. No wheezing, rhonchi or rales.  Abdominal:     General: Bowel sounds are normal. There is no distension.     Palpations: Abdomen is soft. Abdomen is not rigid. There is no fluid wave or mass.     Tenderness: There is generalized abdominal tenderness. There is no guarding or rebound.  Lymphadenopathy:     Head:     Right side of head: No submental, submandibular, tonsillar, preauricular, posterior auricular or occipital adenopathy.     Left side of  head: No submental, submandibular, tonsillar, preauricular, posterior auricular or  occipital adenopathy.     Cervical: No cervical adenopathy.     Right cervical: No superficial, deep or posterior cervical adenopathy.    Left cervical: No superficial, deep or posterior cervical adenopathy.  Skin:    General: Skin is warm and dry.  Neurological:     Mental Status: She is alert.     Cranial Nerves: No cranial nerve deficit.     Sensory: No sensory deficit.     Deep Tendon Reflexes:     Reflex Scores:      Bicep reflexes are 2+ on the right side and 2+ on the left side.      Patellar reflexes are 2+ on the right side and 2+ on the left side.    Comments: Grip equal and strong bilateral upper extremities. Gait strong and steady. Able to perform  finger-to-nose without difficulty.  Dix hall pike maneuver did elicit vertigo. No nystagmus noted.    Psychiatric:        Speech: Speech normal.        Behavior: Behavior normal.        Thought Content: Thought content normal.

## 2023-05-03 NOTE — Assessment & Plan Note (Addendum)
Patient is afebrile.  Differential continues to include IBS- constipation, constipation.  Symptoms raise concern for gastroparesis.  Pending abdominal x-ray today.  We jointly agreed not to pursue CT abdomen pelvis .  I reached out to Dr. Mia Creek in regards to ordering gastric emptying study and sooner appointment.  Per his telephone call with patient last month, the plan was to cancel GES study so patient could take MiraLAX /Gatorade.  Encouraged patient to take Bentyl every 6 hours.  I also encouraged her to start MiraLAX, Gatorade as recommended by Dr. Mia Creek for underlying constipation.

## 2023-05-03 NOTE — Patient Instructions (Addendum)
Trial of meclizine.  We will consider referral for vestibular rehab at follow up.   I will reach out Dr. Mia Creek in regards to gastric emptying study.    Please go to [facility] to have x-ray done of your abdomen to evaluate for constipation today.  I would encourage use to use Bentyl more regularly for abdominal cramping.  Nice to see you, always.

## 2023-05-03 NOTE — Assessment & Plan Note (Addendum)
Reassuring neurologic exam.  MRI of the brain without acute findings 04/11/2023.  Pending ultrasound carotid arteries.    She is not orthostatic ( see flow sheet).  Unsure if vertigo is related to migraine, abdominal pain.  Consider referral for PT vestibular rehab.  Trial of meclizine today.

## 2023-05-04 ENCOUNTER — Ambulatory Visit (INDEPENDENT_AMBULATORY_CARE_PROVIDER_SITE_OTHER): Payer: Medicare Other

## 2023-05-04 VITALS — Ht 69.0 in | Wt 203.0 lb

## 2023-05-04 DIAGNOSIS — Z0000 Encounter for general adult medical examination without abnormal findings: Secondary | ICD-10-CM | POA: Diagnosis not present

## 2023-05-04 NOTE — Progress Notes (Signed)
I connected with  [name] on 05/04/23 by a audio enabled telemedicine application and verified that I am speaking with the correct person using two identifiers.  Patient Location: Home  Provider Location: Office/Clinic  I discussed the limitations of evaluation and management by telemedicine. The patient expressed understanding and agreed to proceed.  Subjective:   [name] [name] is a 57 y.o. female who presents for Medicare Annual (Subsequent) preventive examination.  Review of Systems           Objective:    Today's Vitals   05/04/23 1027 05/04/23 1044  Weight:  203 lb (92.1 kg)  Height:  5\' 9"  (1.753 m)  PainSc: 8     Body mass index is 29.98 kg/m.     05/04/2023   10:33 AM 04/11/2023    4:33 PM 11/05/2022    9:39 AM 10/16/2022   11:16 AM 05/07/2021   12:04 AM 11/26/2020    1:11 PM 11/21/2020    2:22 PM  Advanced Directives  Does Patient Have a Medical Advance Directive? No No No No No No No  Would patient like information on creating a medical advance directive? No - Patient declined No - Patient declined No - Patient declined   No - Patient declined No - Patient declined    Current Medications (verified) Outpatient Encounter Medications as of 05/04/2023  Medication Sig   albuterol (PROVENTIL) (2.5 MG/3ML) 0.083% nebulizer solution Take 3 mLs (2.5 mg total) by nebulization every 6 (six) hours as needed for wheezing or shortness of breath.   albuterol (VENTOLIN HFA) 108 (90 Base) MCG/ACT inhaler Inhale 1-2 puffs into the lungs every 4 (four) hours as needed for wheezing or shortness of breath.   Ascorbic Acid (VITAMIN C) 500 MG CHEW Chew 2,500 mg by mouth daily.   Biotin 5000 MCG CHEW Chew 2 each by mouth daily.   carbidopa-levodopa (SINEMET CR) 50-200 MG tablet Take 1 tablet by mouth 5 (five) times daily.   Cholecalciferol (VITAMIN D3) 2000 units capsule Vitamin D3 2,000 unit capsule   colestipol (COLESTID) 1 g tablet Take 3 tablets (3 g total) by  mouth 2 (two) times daily.   dexlansoprazole (DEXILANT) 60 MG capsule Take 1 capsule (60 mg total) by mouth daily.   diazepam (VALIUM) 5 MG tablet Take two 5 mg tablets 2  times daily   diclofenac sodium (VOLTAREN) 1 % GEL Apply 2 g topically 4 (four) times daily as needed (for knee & finger pain.).    dicyclomine (BENTYL) 20 MG tablet Take 1 tablet (20 mg total) by mouth every 6 (six) hours. (Patient taking differently: Take 20 mg by mouth every 6 (six) hours. Pt is taking 3 times a daily)   DULoxetine (CYMBALTA) 60 MG capsule Take 1 capsule (60 mg total) by mouth daily.   EPINEPHrine 0.3 mg/0.3 mL IJ SOAJ injection Inject 0.3 mg into the muscle as needed for anaphylaxis.   famotidine (PEPCID) 20 MG tablet Take 1 tablet (20 mg total) by mouth at bedtime.   folic acid (FOLVITE) 1 MG tablet Take 1 tablet (1 mg total) by mouth daily.   HYDROcodone-acetaminophen (NORCO/VICODIN) 5-325 MG tablet Take 2x"s daily   hydroxychloroquine (PLAQUENIL) 200 MG tablet Plaquenil 200 mg tablet  Take 1 tablet twice a day by oral route after meals for 90 days.   latanoprost (XALATAN) 0.005 % ophthalmic solution 1 drop at bedtime.   levothyroxine (SYNTHROID) 75 MCG tablet TAKE 1 TABLET BY MOUTH DAILY 30 MINUTES BEFORE BREAKFAST  meclizine (ANTIVERT) 12.5 MG tablet Take 1 tablet (12.5 mg total) by mouth 2 (two) times daily as needed for dizziness.   montelukast (SINGULAIR) 10 MG tablet TAKE 1 TABLET BY MOUTH EVERY NIGHT AT BEDTIME   Multiple Vitamins-Minerals (MULTI-VITAMIN GUMMIES PO) Multi Vitamin  GUMMIES   neomycin-polymyxin-hydrocortisone (CORTISPORIN) OTIC solution Place 3 drops into the right ear 4 (four) times daily.   ondansetron (ZOFRAN) 4 MG tablet Take 1 tablet (4 mg total) by mouth every 8 (eight) hours as needed for nausea or vomiting.   PARoxetine (PAXIL) 10 MG tablet Take 1 tablet (10 mg total) by mouth daily.   Polyvinyl Alcohol-Povidone (REFRESH OP) Place 1 drop into both eyes 2 (two) times daily.    Probiotic Product (PROBIOTIC PO) Probiotic 10 billion cell capsule  daily   rizatriptan (MAXALT) 10 MG tablet Take 1 tablet (10 mg total) by mouth as needed for migraine. May repeat in 2 hours if needed. Max dose 20 mg in 1 day   Sodium Chloride 3 % AERS Saline Nasal Mist 3 %  spray nose deeply every hour or two and blow nose   topiramate (TOPAMAX) 50 MG tablet Take 150 mg by mouth in the morning and at bedtime.   traZODone (DESYREL) 50 MG tablet Take 1 tablet (50 mg total) by mouth at bedtime.   acetaminophen (TYLENOL) 650 MG CR tablet Take 650 mg by mouth every 8 (eight) hours as needed for pain. (Patient not taking: Reported on 05/04/2023)   diphenoxylate-atropine (LOMOTIL) 2.5-0.025 MG tablet Take 1 tablet by mouth 4 (four) times daily as needed. (Patient not taking: Reported on 05/04/2023)   Ferrous Sulfate (IRON PO) Take by mouth. (Patient not taking: Reported on 05/04/2023)   methocarbamol (ROBAXIN) 750 MG tablet Take 1 tablet (750 mg total) by mouth every 6 (six) hours as needed. (Patient not taking: Reported on 05/04/2023)   No facility-administered encounter medications on file as of 05/04/2023.    Allergies (verified) Bee venom, Fish allergy, Latex, Peanut-containing drug products, Prasterone, Shellfish allergy, Shellfish-derived products, Compazine [prochlorperazine edisylate], Dhea [nutritional supplements], Dilaudid [hydromorphone hcl], Famotidine, Lactose, Lactose intolerance (gi), Other, Prochlorperazine, Promethazine, Promethazine hcl, Reclast [zoledronic acid], Clarithromycin, Clotrimazole, Hydromorphone hcl, and Penicillins   History: Past Medical History:  Diagnosis Date   ADHD    Anemia    ANXIETY 06/06/2007   Arthritis    related to lupus   Asthma    BACK PAIN 02/22/2009   Cervical cancer (HCC)    LEEP 2005    Chicken pox    Chronic pain    goes to pain management provider   Collagen vascular disease (HCC)    Connective tissue disease (HCC)    DEPRESSION 06/06/2007    Essential tremor    Fibromyalgia    Fibromyalgia    FREQUENCY, URINARY 07/07/2007   Gastritis    GERD 06/06/2007   Glaucoma    Headache    h/o migraines    History of hiatal hernia    History of kidney stones    HYPOTHYROIDISM 06/06/2007   INTERSTITIAL CYSTITIS 09/11/2010   Lupus (HCC) 2006   Lupus (HCC)    Menopause    Migraine    Mitral valve regurgitation    NEPHROLITHIASIS, HX OF 11/05/2010   Neuropathy    Neuropathy    OCD (obsessive compulsive disorder)    Optic neuritis    2005   OSTEOPENIA 10/14/2009   Osteoporosis    OVERACTIVE BLADDER 10/14/2009   PAIN, CHRONIC NEC 06/06/2007   Panniculitis  Parkinson disease 04/03/2019   Parkinson disease    POLYARTHRITIS 08/19/2007   Raynaud's disease    Sicca syndrome (HCC)    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 Valentino Saxon [facility]    Vasculitis [facility])    WEIGHT GAIN 02/22/2009   Past Surgical History:  Procedure Laterality Date   APPENDECTOMY     CERVICAL BIOPSY  W/ LOOP ELECTRODE EXCISION  2005   CHOLECYSTECTOMY     COLONOSCOPY N/A 10/16/2022   Procedure: COLONOSCOPY;  Surgeon: Regis Bill, MD;  Location: [facility] ENDOSCOPY;  Service: Gastroenterology;  Laterality: N/A;   ESOPHAGOGASTRODUODENOSCOPY N/A 10/16/2022   Procedure: ESOPHAGOGASTRODUODENOSCOPY (EGD);  Surgeon: Regis Bill, MD;  Location[facility] ENDOSCOPY;  Service: Gastroenterology;  Laterality: N/A;   ESOPHAGOGASTRODUODENOSCOPY (EGD) WITH PROPOFOL N/A 08/27/2017   Procedure: ESOPHAGOGASTRODUODENOSCOPY (EGD) WITH PROPOFOL;  Surgeon: Scot Jun, MD;  Location: [facility] ENDOSCOPY;  Service: Endoscopy;  Laterality: N/A;   HAND SURGERY     repair of left metatarsal    HARDWARE REMOVAL Right 02/16/2017   Procedure: HARDWARE REMOVAL FROM HIP;  Surgeon: Kennedy Bucker, MD;  Location: [facility] ORS;  Service: Orthopedics;  Laterality: Right;   JOINT REPLACEMENT     OTHER SURGICAL HISTORY     left 3rd metatarsal fracture repair  2011    REVISION TOTAL HIP ARTHROPLASTY  06/2009   replacement 2010 then screw and plate removal 1610; right hip   TONSILLECTOMY     TONSILLECTOMY     wrist  ligament repair bilateral     WRIST RECONSTRUCTION     WRIST SURGERY     laceration of right wrist ligaments   wrist surgery     laceration of left wrist surgery    Family History  Problem Relation Age of Onset   Hypertension Mother    Arthritis Mother    Heart disease Mother        afib   Asthma Sister    Asthma Daughter    Arthritis Daughter    Heart disease Daughter        ?heart condition on BB   ADD / ADHD Daughter    Pancreatic cancer Father    Cancer Father        pancreatitic    Diabetes Maternal Grandmother    Heart disease Maternal Grandmother    Arthritis Maternal Grandmother    Hypertension Maternal Grandmother    Dementia Maternal Grandmother    Colon cancer Maternal Uncle 45   Crohn's disease Maternal Aunt    Breast cancer Maternal Aunt 27   Diabetes Maternal Aunt    Breast cancer Cousin 48       maternal   Cancer Cousin        m cousin breat cancer s/p removal both breasts    Social History   Socioeconomic History   Marital status: Divorced    Spouse name: Not on file   Number of children: 1   Years of education: Not on file   Highest education level: Not on file  Occupational History   Occupation: DISABLED    Employer: UNEMPLOYED  Tobacco Use   Smoking status: Never   Smokeless tobacco: Never  Vaping Use   Vaping Use: Never used  Substance and Sexual Activity   Alcohol use: No   Drug use: No   Sexual activity: Not Currently    Birth control/protection: None, Post-menopausal  Other Topics Concern   Not on file  Social History Narrative   She  was previously a pediatrician, 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     She lives with daughter and mother      Social Determinants of Health   Financial  Resource Strain: Low Risk  (05/04/2023)   Overall Financial Resource Strain (CARDIA)    Difficulty of Paying Living Expenses: Not hard at all  Food Insecurity: No Food Insecurity (05/04/2023)   Hunger Vital Sign    Worried About Running Out of Food in the Last Year: Never true    Ran Out of Food in the Last Year: Never true  Transportation Needs: No Transportation Needs (05/04/2023)   PRAPARE - Administrator, Civil Service (Medical): No    Lack of Transportation (Non-Medical): No  Physical Activity: Insufficiently Active (05/04/2023)   Exercise Vital Sign    Days of Exercise per Week: 7 days    Minutes of Exercise per Session: 20 min  Stress: Stress Concern Present (05/04/2023)   [employer] of Occupational Health - Occupational Stress Questionnaire    Feeling of Stress : To some extent  Social Connections: Socially Isolated (05/04/2023)   Social Connection and Isolation Panel [NHANES]    Frequency of Communication with Friends and Family: Twice a week    Frequency of Social Gatherings with Friends and Family: More than three times a week    Attends Religious Services: Never    Database administrator or Organizations: No    Attends Engineer, structural: Never    Marital Status: Divorced    Tobacco Counseling Counseling given: Not Answered   Clinical Intake:  Pre-visit preparation completed: Yes  Pain : 0-10 Pain Score: 8  Pain Type: Chronic pain Pain Location: Abdomen     Nutritional Risks: None Diabetes: No  How often do you need to have someone help you when you read instructions, pamphlets, or other written materials from your doctor or pharmacy?: 1 - Never  Diabetic?no  Interpreter Needed?: No  Information entered by :: Kennedy Bucker, LPN   Activities of Daily Living    05/04/2023   10:34 AM  In your present state of health, do you have any difficulty performing the following activities:  Hearing? 0  Vision? 0  Difficulty concentrating or  making decisions? 0  Walking or climbing stairs? 1  Dressing or bathing? 0  Doing errands, shopping? 0  Preparing Food and eating ? N  Using the Toilet? N  In the past six months, have you accidently leaked urine? N  Do you have problems with loss of bowel control? N  Managing your Medications? N  Managing your Finances? N  Housekeeping or managing your Housekeeping? N    Patient Care Team: Allegra Grana, FNP as PCP - General (Family Medicine)  Indicate any recent Medical Services you may have received from other than [facility] providers in the past year (date may be approximate).     Assessment:   This is a routine wellness examination for Joclyn.  Hearing/Vision screen Hearing Screening - Comments:: No aids  Vision Screening - Comments:: Wears glasses- Dr.Pesser- has glaucoma  Dietary issues and exercise activities discussed: Current Exercise Habits: Home exercise routine, Type of exercise: walking, Time (Minutes): 20, Frequency (Times/Week): 7, Weekly Exercise (Minutes/Week): 140   Goals Addressed             This Visit's Progress    DIET - EAT MORE FRUITS AND VEGETABLES  Depression Screen    05/04/2023   10:31 AM 05/03/2023    9:34 AM 03/23/2023   11:36 AM 02/10/2023   11:37 AM 02/01/2023   10:05 AM 01/29/2023    8:18 AM 01/11/2023    1:34 PM  PHQ 2/9 Scores  PHQ - 2 Score 2 1 1 6 6 6 2   PHQ- 9 Score 4 3 8 21 21 15 3     Fall Risk    05/04/2023   10:34 AM 05/03/2023    9:33 AM 02/10/2023   11:36 AM 02/01/2023   10:05 AM 01/29/2023    8:25 AM  Fall Risk   Falls in the past year? 1 0 0 1 0  Number falls in past yr: 0 0 0 0 0  Injury with Fall? 1 0 0 1 0  Risk for fall due to : History of fall(s) No Fall Risks No Fall Risks History of fall(s) No Fall Risks  Follow up Falls prevention discussed;Falls evaluation completed Falls evaluation completed Falls evaluation completed Falls evaluation completed Falls evaluation completed    FALL RISK PREVENTION PERTAINING  TO THE HOME:  Any stairs in or around the home? Yes  If so, are there any without handrails? No  Home free of loose throw rugs in walkways, pet beds, electrical cords, etc? Yes  Adequate lighting in your home to reduce risk of falls? Yes   ASSISTIVE DEVICES UTILIZED TO PREVENT FALLS:  Life alert? No  Use of a cane, walker or w/c? Yes - cane always Grab bars in the bathroom? Yes  Shower chair or bench in shower? No  Elevated toilet seat or a handicapped toilet? Yes    Cognitive Function:        05/04/2023   10:38 AM 11/26/2020    1:16 PM  6CIT Screen  What Year? 0 points 0 points  What month? 0 points 0 points  What time? 0 points 0 points  Count back from 20 0 points 0 points  Months in reverse 0 points 0 points  Repeat phrase 0 points 0 points  Total Score 0 points 0 points    Immunizations Immunization History  Administered Date(s) Administered   Influenza Inj Mdck Quad Pf 08/30/2017   Influenza Split 08/13/2011, 08/19/2012, 09/18/2013   Influenza Whole 09/13/2007, 09/12/2008, 08/06/2010   Influenza,inj,Quad PF,6+ Mos 08/13/2011, 08/19/2012, 09/18/2013, 08/21/2014, 09/18/2014, 09/02/2015, 09/01/2016, 08/30/2017, 08/19/2018, 07/28/2019, 08/06/2020, 08/21/2021, 07/23/2022   Influenza-Unspecified 09/02/2015, 08/30/2017   PFIZER(Purple Top)SARS-COV-2 Vaccination 03/19/2020, 04/09/2020, 11/06/2020   Pfizer Covid-19 Vaccine Bivalent Booster 8yrs & up 04/21/2021, 10/21/2021   Pneumococcal Conjugate-13 11/30/2004, 06/04/2014, 09/03/2014   Pneumococcal Polysaccharide-23 11/30/2004, 09/05/2012, 01/21/2022   Td 11/30/2004   Tdap 09/11/2014, 11/05/2014, 10/19/2016    TDAP status: Up to date  Flu Vaccine status: Up to date  Pneumococcal vaccine status: Up to date  Covid-19 vaccine status: Completed vaccines  Qualifies for Shingles Vaccine? Yes   Zostavax completed No   Shingrix Completed?: No.    Education has been provided regarding the importance of this vaccine.  Patient has been advised to call insurance company to determine out of pocket expense if they have not yet received this vaccine. Advised may also receive vaccine at local pharmacy or Health Dept. Verbalized acceptance and understanding.  Screening Tests Health Maintenance  Topic Date Due   Zoster Vaccines- Shingrix (1 of 2) Never done   COVID-19 Vaccine (6 - 2023-24 season) 07/31/2022   Diabetic kidney evaluation - Urine ACR  11/06/2022   INFLUENZA VACCINE  07/01/2023   Diabetic kidney evaluation - eGFR measurement  04/10/2024   Medicare Annual Wellness (AWV)  05/03/2024   MAMMOGRAM  02/24/2025   PAP SMEAR-Modifier  12/18/2025   DTaP/Tdap/Td (5 - Td or Tdap) 10/19/2026   Colonoscopy  10/16/2032   Hepatitis C Screening  Completed   HIV Screening  Completed   HPV VACCINES  Aged Out   FOOT EXAM  Discontinued   HEMOGLOBIN A1C  Discontinued   OPHTHALMOLOGY EXAM  Discontinued    Health Maintenance  Health Maintenance Due  Topic Date Due   Zoster Vaccines- Shingrix (1 of 2) Never done   COVID-19 Vaccine (6 - 2023-24 season) 07/31/2022   Diabetic kidney evaluation - Urine ACR  11/06/2022    Colorectal cancer screening: Type of screening: Colonoscopy. Completed 10/16/22. Repeat every 10 years  Mammogram status: Completed 02/25/23. Repeat every year    Lung Cancer Screening: (Low Dose CT Chest recommended if Age 63-80 years, 30 pack-year currently smoking OR have quit w/in 15years.) does not qualify.    Additional Screening:  Hepatitis C Screening: does qualify; Completed 11/05/17  Vision Screening: Recommended annual ophthalmology exams for early detection of glaucoma and other disorders of the eye. Is the patient up to date with their annual eye exam?  Yes  Who is the provider or what is the name of the office in which the patient attends annual eye exams? Dr Earlyne Iba If pt is not established with a provider, would they like to be referred to a provider to establish care? No .    Dental Screening: Recommended annual dental exams for proper oral hygiene  Community Resource Referral / Chronic Care Management: CRR required this visit?  No   CCM required this visit?  No      Plan:     I have personally reviewed and noted the following in the patient's chart:   Medical and social history Use of alcohol, tobacco or illicit drugs  Current medications and supplements including opioid prescriptions. Patient is currently taking opioid prescriptions. Information provided to patient regarding non-opioid alternatives. Patient advised to discuss non-opioid treatment plan with their provider. Functional ability and status Nutritional status Physical activity Advanced directives List of other physicians Hospitalizations, surgeries, and ER visits in previous 12 months Vitals Screenings to include cognitive, depression, and falls Referrals and appointments  In addition, I have reviewed and discussed with patient certain preventive protocols, quality metrics, and best practice recommendations. A written personalized care plan for preventive services as well as general preventive health recommendations were provided to patient.     Hal Hope, LPN   12/05/1094   Nurse Notes: none

## 2023-05-04 NOTE — Patient Instructions (Signed)
[name] , Thank you for taking time to come for your Medicare Wellness Visit. I appreciate your ongoing commitment to your health goals. Please review the following plan we discussed and let me know if I can assist you in the future.   These are the goals we discussed:  Goals       DIET - EAT MORE FRUITS AND VEGETABLES      Follow up with Primary Care Provider      As needed      I would like to lose a little weight (pt-stated)      Portion control meals Walk for exercise        This is a list of the screening recommended for you and due dates:  Health Maintenance  Topic Date Due   Zoster (Shingles) Vaccine (1 of 2) Never done   COVID-19 Vaccine (6 - 2023-24 season) 07/31/2022   Yearly kidney health urinalysis for diabetes  11/06/2022   Flu Shot  07/01/2023   Yearly kidney function blood test for diabetes  04/10/2024   Medicare Annual Wellness Visit  05/03/2024   Mammogram  02/24/2025   Pap Smear  12/18/2025   DTaP/Tdap/Td vaccine (5 - Td or Tdap) 10/19/2026   Colon Cancer Screening  10/16/2032   Hepatitis C Screening  Completed   HIV Screening  Completed   HPV Vaccine  Aged Out   Complete foot exam   Discontinued   Hemoglobin A1C  Discontinued   Eye exam for diabetics  Discontinued    Advanced directives: no  Conditions/risks identified: none  Next appointment: Follow up in one year for your annual wellness visit. 05/05/24 @ 9:15 am by phone  Preventive Care 40-64 Years, Female Preventive care refers to lifestyle choices and visits with your health care provider that can promote health and wellness. What does preventive care include? A yearly physical exam. This is also called an annual well check. Dental exams once or twice a year. Routine eye exams. Ask your health care provider how often you should have your eyes checked. Personal lifestyle choices, including: Daily care of your teeth and gums. Regular physical activity. Eating a healthy  diet. Avoiding tobacco and drug use. Limiting alcohol use. Practicing safe sex. Taking low-dose aspirin daily starting at age 28. Taking vitamin and mineral supplements as recommended by your health care provider. What happens during an annual well check? The services and screenings done by your health care provider during your annual well check will depend on your age, overall health, lifestyle risk factors, and family history of disease. Counseling  Your health care provider may ask you questions about your: Alcohol use. Tobacco use. Drug use. Emotional well-being. Home and relationship well-being. Sexual activity. Eating habits. Work and work Astronomer. Method of birth control. Menstrual cycle. Pregnancy history. Screening  You may have the following tests or measurements: Height, weight, and BMI. Blood pressure. Lipid and cholesterol levels. These may be checked every 5 years, or more frequently if you are over 69 years old. Skin check. Lung cancer screening. You may have this screening every year starting at age 35 if you have a 30-pack-year history of smoking and currently smoke or have quit within the past 15 years. Fecal occult blood test (FOBT) of the stool. You may have this test every year starting at age 16. Flexible sigmoidoscopy or colonoscopy. You may have a sigmoidoscopy every 5 years or a colonoscopy every 10 years starting at age 30. Hepatitis C blood test. Hepatitis  B blood test. Sexually transmitted disease (STD) testing. Diabetes screening. This is done by checking your blood sugar (glucose) after you have not eaten for a while (fasting). You may have this done every 1-3 years. Mammogram. This may be done every 1-2 years. Talk to your health care provider about when you should start having regular mammograms. This may depend on whether you have a family history of breast cancer. BRCA-related cancer screening. This may be done if you have a family history of  breast, ovarian, tubal, or peritoneal cancers. Pelvic exam and Pap test. This may be done every 3 years starting at age 96. Starting at age 33, this may be done every 5 years if you have a Pap test in combination with an HPV test. Bone density scan. This is done to screen for osteoporosis. You may have this scan if you are at high risk for osteoporosis. Discuss your test results, treatment options, and if necessary, the need for more tests with your health care provider. Vaccines  Your health care provider may recommend certain vaccines, such as: Influenza vaccine. This is recommended every year. Tetanus, diphtheria, and acellular pertussis (Tdap, Td) vaccine. You may need a Td booster every 10 years. Zoster vaccine. You may need this after age 57. Pneumococcal 13-valent conjugate (PCV13) vaccine. You may need this if you have certain conditions and were not previously vaccinated. Pneumococcal polysaccharide (PPSV23) vaccine. You may need one or two doses if you smoke cigarettes or if you have certain conditions. Talk to your health care provider about which screenings and vaccines you need and how often you need them. This information is not intended to replace advice given to you by your health care provider. Make sure you discuss any questions you have with your health care provider. Document Released: 12/13/2015 Document Revised: 08/05/2016 Document Reviewed: 09/17/2015 Elsevier Interactive Patient Education  2017 [employer].    Fall Prevention in the Home Falls can cause injuries. They can happen to people of all ages. There are many things you can do to make your home safe and to help prevent falls. What can I do on the outside of my home? Regularly fix the edges of walkways and driveways and fix any cracks. Remove anything that might make you trip as you walk through a door, such as a raised step or threshold. Trim any bushes or trees on the path to your home. Use bright outdoor  lighting. Clear any walking paths of anything that might make someone trip, such as rocks or tools. Regularly check to see if handrails are loose or broken. Make sure that both sides of any steps have handrails. Any raised decks and porches should have guardrails on the edges. Have any leaves, snow, or ice cleared regularly. Use sand or salt on walking paths during winter. Clean up any spills in your garage right away. This includes oil or grease spills. What can I do in the bathroom? Use night lights. Install grab bars by the toilet and in the tub and shower. Do not use towel bars as grab bars. Use non-skid mats or decals in the tub or shower. If you need to sit down in the shower, use a plastic, non-slip stool. Keep the floor dry. Clean up any water that spills on the floor as soon as it happens. Remove soap buildup in the tub or shower regularly. Attach bath mats securely with double-sided non-slip rug tape. Do not have throw rugs and other things on the floor that can  make you trip. What can I do in the bedroom? Use night lights. Make sure that you have a light by your bed that is easy to reach. Do not use any sheets or blankets that are too big for your bed. They should not hang down onto the floor. Have a firm chair that has side arms. You can use this for support while you get dressed. Do not have throw rugs and other things on the floor that can make you trip. What can I do in the kitchen? Clean up any spills right away. Avoid walking on wet floors. Keep items that you use a lot in easy-to-reach places. If you need to reach something above you, use a strong step stool that has a grab bar. Keep electrical cords out of the way. Do not use floor polish or wax that makes floors slippery. If you must use wax, use non-skid floor wax. Do not have throw rugs and other things on the floor that can make you trip. What can I do with my stairs? Do not leave any items on the stairs. Make  sure that there are handrails on both sides of the stairs and use them. Fix handrails that are broken or loose. Make sure that handrails are as long as the stairways. Check any carpeting to make sure that it is firmly attached to the stairs. Fix any carpet that is loose or worn. Avoid having throw rugs at the top or bottom of the stairs. If you do have throw rugs, attach them to the floor with carpet tape. Make sure that you have a light switch at the top of the stairs and the bottom of the stairs. If you do not have them, ask someone to add them for you. What else can I do to help prevent falls? Wear shoes that: Do not have high heels. Have rubber bottoms. Are comfortable and fit you well. Are closed at the toe. Do not wear sandals. If you use a stepladder: Make sure that it is fully opened. Do not climb a closed stepladder. Make sure that both sides of the stepladder are locked into place. Ask someone to hold it for you, if possible. Clearly mark and make sure that you can see: Any grab bars or handrails. First and last steps. Where the edge of each step is. Use tools that help you move around (mobility aids) if they are needed. These include: Canes. Walkers. Scooters. Crutches. Turn on the lights when you go into a dark area. Replace any light bulbs as soon as they burn out. Set up your furniture so you have a clear path. Avoid moving your furniture around. If any of your floors are uneven, fix them. If there are any pets around you, be aware of where they are. Review your medicines with your doctor. Some medicines can make you feel dizzy. This can increase your chance of falling. Ask your doctor what other things that you can do to help prevent falls. This information is not intended to replace advice given to you by your health care provider. Make sure you discuss any questions you have with your health care provider. Document Released: 09/12/2009 Document Revised: 04/23/2016  Document Reviewed: 12/21/2014 Elsevier Interactive Patient Education  2017 [employer].

## 2023-05-06 DIAGNOSIS — F3181 Bipolar II disorder: Secondary | ICD-10-CM | POA: Diagnosis not present

## 2023-05-11 ENCOUNTER — Encounter: Payer: Self-pay | Admitting: Family

## 2023-05-13 ENCOUNTER — Ambulatory Visit
Admission: RE | Admit: 2023-05-13 | Discharge: 2023-05-13 | Disposition: A | Discharge status: Home / Self Care | Payer: Medicare Other | Source: Ambulatory Visit | Attending: Family | Admitting: Family

## 2023-05-13 DIAGNOSIS — R42 Dizziness and giddiness: Secondary | ICD-10-CM | POA: Diagnosis not present

## 2023-05-13 DIAGNOSIS — R55 Syncope and collapse: Secondary | ICD-10-CM | POA: Diagnosis not present

## 2023-05-14 ENCOUNTER — Other Ambulatory Visit: Payer: Self-pay | Admitting: Family

## 2023-05-14 DIAGNOSIS — K909 Intestinal malabsorption, unspecified: Secondary | ICD-10-CM

## 2023-05-14 MED ORDER — COLESTIPOL HCL 1 G PO TABS
3.0000 g | ORAL_TABLET | Freq: Two times a day (BID) | ORAL | 1 refills | Status: AC
Start: 2023-05-14 — End: 2024-05-13

## 2023-05-17 ENCOUNTER — Other Ambulatory Visit: Payer: Self-pay | Admitting: Family

## 2023-05-17 ENCOUNTER — Encounter: Payer: Self-pay | Admitting: Family

## 2023-05-17 DIAGNOSIS — R112 Nausea with vomiting, unspecified: Secondary | ICD-10-CM

## 2023-05-17 MED ORDER — ONDANSETRON HCL 8 MG PO TABS
8.0000 mg | ORAL_TABLET | Freq: Three times a day (TID) | ORAL | 1 refills | Status: DC | PRN
Start: 2023-05-17 — End: 2023-05-26

## 2023-05-17 NOTE — Telephone Encounter (Signed)
SEE previous note from Dr do not order now

## 2023-05-21 ENCOUNTER — Encounter: Payer: Self-pay | Admitting: Family

## 2023-05-26 ENCOUNTER — Encounter: Payer: Self-pay | Admitting: Family

## 2023-05-26 ENCOUNTER — Telehealth (INDEPENDENT_AMBULATORY_CARE_PROVIDER_SITE_OTHER): Payer: Medicare Other | Admitting: Family

## 2023-05-26 VITALS — Ht 69.0 in | Wt 203.4 lb

## 2023-05-26 DIAGNOSIS — K5909 Other constipation: Secondary | ICD-10-CM

## 2023-05-26 DIAGNOSIS — R110 Nausea: Secondary | ICD-10-CM | POA: Diagnosis not present

## 2023-05-26 MED ORDER — ONDANSETRON 8 MG PO TBDP
8.0000 mg | ORAL_TABLET | Freq: Three times a day (TID) | ORAL | 2 refills | Status: DC | PRN
Start: 2023-05-26 — End: 2023-08-09

## 2023-05-26 NOTE — Progress Notes (Signed)
Virtual Visit via Video Note  I connected with [name] on 06/02/23 at  4:00 PM EDT by a video enabled telemedicine application and verified that I am speaking with the correct person using two identifiers. Location patient: home Location provider: work  Persons participating in the virtual visit: patient, provider  I discussed the limitations of evaluation and management by telemedicine and the availability of in person appointments. The patient expressed understanding and agreed to proceed.  HPI:  She continues to have abdominal pain.  BM are soft, watery,  and multiple times per day.   She is not taking ferrous sulfate.   She didn't tolerate probiotic.   Diet is limited to rice.   She has not been able to do gatorade/miralax   She is compliant with Paxil 10 mg daily, trazodone 50 mg at bedtime, Cymbalta 60 mg daily  She continues to follow with counselor and psychiatric NP, Tamela Oddi  No fever, weight loss, bloody diarrhea.   Colonoscopy is up-to-date  ROS: See pertinent positives and negatives per HPI.  EXAM:  VITALS per patient if applicable: Ht 5\' 9"  (1.753 m)   Wt 203 lb 6.4 oz (92.3 kg)   LMP 12/15/2015 Comment: neg urine test   BMI 30.04 kg/m  BP Readings from Last 3 Encounters:  05/03/23 128/82  04/12/23 121/67  03/23/23 118/80   Wt Readings from Last 3 Encounters:  05/26/23 203 lb 6.4 oz (92.3 kg)  05/04/23 203 lb (92.1 kg)  05/03/23 203 lb (92.1 kg)      05/26/2023    3:56 PM 05/26/2023    3:53 PM 05/04/2023   10:31 AM  Depression screen PHQ 2/9  Decreased Interest 0 0 1  Down, Depressed, Hopeless 3 0 1  PHQ - 2 Score 3 0 2  Altered sleeping 2 0 1  Tired, decreased energy 2 0 1  Change in appetite 0 0 0  Feeling bad or failure about yourself  0 0 0  Trouble concentrating 0 0 0  Moving slowly or fidgety/restless 0 0 0  Suicidal thoughts 0 0 0  PHQ-9 Score 7 0 4  Difficult doing work/chores Somewhat difficult Not difficult at all  Somewhat difficult    GENERAL: alert, oriented, appears well and in no acute distress  HEENT: atraumatic, conjunttiva clear, no obvious abnormalities on inspection of external nose and ears  NECK: normal movements of the head and neck  LUNGS: on inspection no signs of respiratory distress, breathing rate appears normal, no obvious gross SOB, gasping or wheezing  CV: no obvious cyanosis  MS: moves all visible extremities without noticeable abnormality  PSYCH/NEURO: pleasant and cooperative, no obvious depression or anxiety, speech and thought processing grossly intact  ASSESSMENT AND PLAN: Nausea -     Ondansetron; Take 1 tablet (8 mg total) by mouth every 8 (eight) hours as needed for nausea or vomiting.  Dispense: 30 tablet; Refill: 2  Chronic constipation Assessment & Plan: acute on chronic. She is following with Dr Mia Creek whom suspected IBS-C. Strongly advised to trial bowel prep as he recommended. Patient will follow up with Dr Mia Creek. I have provided her with zofran to use as needed. Will follow.       -we discussed possible serious and likely etiologies, options for evaluation and workup, limitations of telemedicine visit vs in person visit, treatment, treatment risks and precautions. Pt prefers to treat via telemedicine empirically rather then risking or undertaking an in person visit at this moment.  I discussed the assessment and treatment plan with the patient. The patient was provided an opportunity to ask questions and all were answered. The patient agreed with the plan and demonstrated an understanding of the instructions.   The patient was advised to call back or seek an in-person evaluation if the symptoms worsen or if the condition fails to improve as anticipated.  Advised if desired AVS can be mailed or viewed via [facility] if [facility] user.   Mable Paris, FNP

## 2023-05-29 ENCOUNTER — Encounter: Payer: Self-pay | Admitting: Family

## 2023-05-30 ENCOUNTER — Encounter: Payer: Self-pay | Admitting: Family

## 2023-06-01 NOTE — Telephone Encounter (Signed)
LVM to call back and sent my chart message also

## 2023-06-01 NOTE — Telephone Encounter (Signed)
LVM to call back and sent message via my chart as well

## 2023-06-01 NOTE — Telephone Encounter (Signed)
Spoke to pt and she stated that she sent that message on Saturday and she does not need to be seen she is feeling a little better now

## 2023-06-02 NOTE — Assessment & Plan Note (Addendum)
acute on chronic. She is following with Dr Mia Creek whom suspected IBS-C. Strongly advised to trial bowel prep as he recommended. Patient will follow up with Dr Mia Creek. I have provided her with zofran to use as needed. Will follow.

## 2023-06-02 NOTE — Telephone Encounter (Signed)
See previous note has been addressed already

## 2023-06-02 NOTE — Patient Instructions (Addendum)
As discussed, I am strongly urged you to complete bowel cleanse as recommended by Dr. Mia Creek due to concern for underlying constipation  Please let me know how you are doing.

## 2023-06-02 NOTE — Telephone Encounter (Signed)
Pt is ok with appt she has already

## 2023-06-06 DIAGNOSIS — R1013 Epigastric pain: Secondary | ICD-10-CM | POA: Diagnosis not present

## 2023-06-06 DIAGNOSIS — F419 Anxiety disorder, unspecified: Secondary | ICD-10-CM | POA: Diagnosis not present

## 2023-06-06 DIAGNOSIS — R1031 Right lower quadrant pain: Secondary | ICD-10-CM | POA: Diagnosis not present

## 2023-06-06 DIAGNOSIS — Z91148 Patient's other noncompliance with medication regimen for other reason: Secondary | ICD-10-CM | POA: Diagnosis not present

## 2023-06-06 DIAGNOSIS — K648 Other hemorrhoids: Secondary | ICD-10-CM | POA: Diagnosis not present

## 2023-06-06 DIAGNOSIS — Z881 Allergy status to other antibiotic agents status: Secondary | ICD-10-CM | POA: Diagnosis not present

## 2023-06-06 DIAGNOSIS — R1032 Left lower quadrant pain: Secondary | ICD-10-CM | POA: Diagnosis not present

## 2023-06-06 DIAGNOSIS — K529 Noninfective gastroenteritis and colitis, unspecified: Secondary | ICD-10-CM | POA: Diagnosis not present

## 2023-06-06 DIAGNOSIS — K2970 Gastritis, unspecified, without bleeding: Secondary | ICD-10-CM | POA: Diagnosis not present

## 2023-06-06 DIAGNOSIS — Z888 Allergy status to other drugs, medicaments and biological substances status: Secondary | ICD-10-CM | POA: Diagnosis not present

## 2023-06-06 DIAGNOSIS — K449 Diaphragmatic hernia without obstruction or gangrene: Secondary | ICD-10-CM | POA: Diagnosis not present

## 2023-06-06 DIAGNOSIS — R1312 Dysphagia, oropharyngeal phase: Secondary | ICD-10-CM | POA: Diagnosis not present

## 2023-06-06 DIAGNOSIS — Z885 Allergy status to narcotic agent status: Secondary | ICD-10-CM | POA: Diagnosis not present

## 2023-06-06 DIAGNOSIS — K760 Fatty (change of) liver, not elsewhere classified: Secondary | ICD-10-CM | POA: Diagnosis not present

## 2023-06-06 DIAGNOSIS — R0602 Shortness of breath: Secondary | ICD-10-CM | POA: Diagnosis not present

## 2023-06-06 DIAGNOSIS — E039 Hypothyroidism, unspecified: Secondary | ICD-10-CM | POA: Diagnosis not present

## 2023-06-06 DIAGNOSIS — R197 Diarrhea, unspecified: Secondary | ICD-10-CM | POA: Diagnosis not present

## 2023-06-06 DIAGNOSIS — R112 Nausea with vomiting, unspecified: Secondary | ICD-10-CM | POA: Diagnosis not present

## 2023-06-06 DIAGNOSIS — G8929 Other chronic pain: Secondary | ICD-10-CM | POA: Diagnosis not present

## 2023-06-06 DIAGNOSIS — R1084 Generalized abdominal pain: Secondary | ICD-10-CM | POA: Diagnosis not present

## 2023-06-06 DIAGNOSIS — Z9049 Acquired absence of other specified parts of digestive tract: Secondary | ICD-10-CM | POA: Diagnosis not present

## 2023-06-07 ENCOUNTER — Encounter: Payer: Self-pay | Admitting: Family

## 2023-06-07 DIAGNOSIS — R109 Unspecified abdominal pain: Secondary | ICD-10-CM | POA: Diagnosis not present

## 2023-06-07 DIAGNOSIS — G8929 Other chronic pain: Secondary | ICD-10-CM | POA: Diagnosis not present

## 2023-06-07 DIAGNOSIS — R1013 Epigastric pain: Secondary | ICD-10-CM | POA: Diagnosis not present

## 2023-06-14 ENCOUNTER — Encounter: Payer: Self-pay | Admitting: Family

## 2023-06-14 ENCOUNTER — Ambulatory Visit (INDEPENDENT_AMBULATORY_CARE_PROVIDER_SITE_OTHER): Payer: Medicare Other | Admitting: Family

## 2023-06-14 VITALS — BP 136/70 | HR 80 | Temp 98.0°F | Ht 67.5 in | Wt 202.6 lb

## 2023-06-14 DIAGNOSIS — F32A Depression, unspecified: Secondary | ICD-10-CM | POA: Diagnosis not present

## 2023-06-14 DIAGNOSIS — G43909 Migraine, unspecified, not intractable, without status migrainosus: Secondary | ICD-10-CM | POA: Diagnosis not present

## 2023-06-14 DIAGNOSIS — F419 Anxiety disorder, unspecified: Secondary | ICD-10-CM | POA: Diagnosis not present

## 2023-06-14 DIAGNOSIS — R197 Diarrhea, unspecified: Secondary | ICD-10-CM

## 2023-06-14 DIAGNOSIS — K909 Intestinal malabsorption, unspecified: Secondary | ICD-10-CM | POA: Diagnosis not present

## 2023-06-14 NOTE — Assessment & Plan Note (Addendum)
Reviewed hospital discharge with patient.  Medications reconciled.  Patient is not taking Haldol.  Therefore I deferred repeat EKG.  We did discuss use of Zofran.  She does not have a history of QT prolongation.  Patient will continue Bentyl, lactaid Po OTC.    She is scheduled to see Dr. Mia Creek next month.  Tomorrow she is scheduled for swallowing study.  Encouraged her to trial MiraLAX, Gatorade prep as recommended by Dr. Mia Creek.  Will follow closely

## 2023-06-14 NOTE — Progress Notes (Signed)
Assessment & Plan:  Migraine without status migrainosus, not intractable, unspecified migraine type -     Ambulatory referral to Neurology  Anxiety and depression Assessment & Plan: Chronic, stable.  Improved today as she describes being excited about her move to new apartment and new beginning. Continue paxil 10mg  every day, Cymbalta 60 mg every day,  trazodone 50 mg every day.  Referral placed to psychiatry  Orders: -     Ambulatory referral to Psychiatry  Diarrhea due to malabsorption Assessment & Plan: Reviewed hospital discharge with patient.  Medications reconciled.  Patient is not taking Haldol.  Therefore I deferred repeat EKG.  We did discuss use of Zofran.  She does not have a history of QT prolongation.  Patient will continue Bentyl, lactaid Po OTC.    She is scheduled to see Dr. Mia Creek next month.  Tomorrow she is scheduled for swallowing study.  Encouraged her to trial MiraLAX, Gatorade prep as recommended by Dr. Mia Creek.  Will follow closely      Return precautions given.   Risks, benefits, and alternatives of the medications and treatment plan prescribed today were discussed, and patient expressed understanding.   Education regarding symptom management and diagnosis given to patient on AVS either electronically or printed.  No follow-ups on file.  Rennie Plowman, FNP  Subjective:    [name], female    DOB: 1966/11/27, 57 y.o.   MRN: [id number]  CC: [name] [name] is a 57 y.o. female who presents today for follow up.   HPI: Follow-up admission [facility].    Accompanied by her daughter today.  Her daughter and her are excited to looking forward to moving into a new apartment in [place].    She had BM today, loose brown nonbloody stool remain frequent. Weight is stable.  She is scheduled to see Dr Mia Creek in August ( moved from September) 07/07/23  She plans to trial miralax and  Gatorade as advised by Dr Mia Creek.   She  is not taking haldol.   She is taking bentyl with relief.   She is also taking lactaid. She is taking zofran 8 mg every 8 hours.    She can only tolerate Carnation instant breakfast and rice.   patient admitted  06/06/2023 and discharged the following day 06/07/2023 with principal problem abdominal pain.  Started on Haldol as needed for nausea.  Plan to increase if helpful for nausea.    Admission course significant for chronic abdominal pain thought to be functional possibly IBS C with constipation.  During hospitalization, discharge paperwork states patient was not taking Cymbalta, trazodone, Paxil, lamotrigine, colestipol, Sinemet  EKG 04/12/23 QTc 452  Crt 0.94 06/07/23 WBC 5.15 06/06/23 Troponin negative Toxicology screen positive benzodiazepine, opioids  She would like new referral to neurology for migraine.   She would also like to establish care with a new psychiatrist.   Allergies: Bee venom, Fish allergy, Latex, Peanut-containing drug products, Prasterone, Shellfish allergy, Shellfish-derived products, Compazine [prochlorperazine edisylate], Dhea [nutritional supplements], Dilaudid [hydromorphone hcl], Famotidine, Lactose, Lactose intolerance (gi), Other, Prochlorperazine, Promethazine, Promethazine hcl, Reclast [zoledronic acid], Clarithromycin, Clotrimazole, Hydromorphone hcl, and Penicillins Current Outpatient Medications on File Prior to Visit  Medication Sig Dispense Refill   albuterol (PROVENTIL) (2.5 MG/3ML) 0.083% nebulizer solution Take 3 mLs (2.5 mg total) by nebulization every 6 (six) hours as needed for wheezing or shortness of breath. 150 mL 12   albuterol (VENTOLIN HFA) 108 (90 Base) MCG/ACT inhaler Inhale 1-2 puffs into the lungs  every 4 (four) hours as needed for wheezing or shortness of breath. 18 g 11   Ascorbic Acid (VITAMIN C) 500 MG CHEW Chew 2,500 mg by mouth daily.     Biotin 5000 MCG CHEW Chew 2 each by mouth daily.     carbidopa-levodopa (SINEMET CR) 50-200  MG tablet Take 1 tablet by mouth 5 (five) times daily.     Cholecalciferol (VITAMIN D3) 2000 units capsule Vitamin D3 2,000 unit capsule     colestipol (COLESTID) 1 g tablet Take 3 tablets (3 g total) by mouth 2 (two) times daily. 180 tablet 1   dexlansoprazole (DEXILANT) 60 MG capsule Take 1 capsule (60 mg total) by mouth daily. 90 capsule 3   diazepam (VALIUM) 5 MG tablet Take two 5 mg tablets 2  times daily     diclofenac sodium (VOLTAREN) 1 % GEL Apply 2 g topically 4 (four) times daily as needed (for knee & finger pain.).      dicyclomine (BENTYL) 20 MG tablet Take 1 tablet (20 mg total) by mouth every 6 (six) hours. (Patient taking differently: Take 20 mg by mouth every 6 (six) hours. Pt is taking 3 times a daily) 120 tablet 2   DULoxetine (CYMBALTA) 60 MG capsule Take 1 capsule (60 mg total) by mouth daily. 90 capsule 1   EPINEPHrine 0.3 mg/0.3 mL IJ SOAJ injection Inject 0.3 mg into the muscle as needed for anaphylaxis. 1 each 2   famotidine (PEPCID) 20 MG tablet Take 1 tablet (20 mg total) by mouth at bedtime. 30 tablet 2   folic acid (FOLVITE) 1 MG tablet Take 1 tablet (1 mg total) by mouth daily. 90 tablet 3   HYDROcodone-acetaminophen (NORCO/VICODIN) 5-325 MG tablet Take 2x"s daily     hydroxychloroquine (PLAQUENIL) 200 MG tablet Plaquenil 200 mg tablet  Take 1 tablet twice a day by oral route after meals for 90 days.     lactase (LACTAID) 3000 units tablet Take 3,000 Units by mouth 3 (three) times daily with meals.     latanoprost (XALATAN) 0.005 % ophthalmic solution 1 drop at bedtime.     levothyroxine (SYNTHROID) 75 MCG tablet TAKE 1 TABLET BY MOUTH DAILY 30 MINUTES BEFORE BREAKFAST 90 tablet 3   meclizine (ANTIVERT) 12.5 MG tablet Take 1 tablet (12.5 mg total) by mouth 2 (two) times daily as needed for dizziness. 30 tablet 1   methocarbamol (ROBAXIN) 750 MG tablet Take 1 tablet (750 mg total) by mouth every 6 (six) hours as needed. 120 tablet 5   montelukast (SINGULAIR) 10 MG  tablet TAKE 1 TABLET BY MOUTH EVERY NIGHT AT BEDTIME 90 tablet 3   Multiple Vitamins-Minerals (MULTI-VITAMIN GUMMIES PO) Multi Vitamin  GUMMIES     neomycin-polymyxin-hydrocortisone (CORTISPORIN) OTIC solution Place 3 drops into the right ear 4 (four) times daily. 10 mL 0   ondansetron (ZOFRAN-ODT) 8 MG disintegrating tablet Take 1 tablet (8 mg total) by mouth every 8 (eight) hours as needed for nausea or vomiting. 30 tablet 2   PARoxetine (PAXIL) 10 MG tablet Take 1 tablet (10 mg total) by mouth daily. 90 tablet 0   Polyvinyl Alcohol-Povidone (REFRESH OP) Place 1 drop into both eyes 2 (two) times daily.     Probiotic Product (PROBIOTIC PO) Probiotic 10 billion cell capsule  daily     rizatriptan (MAXALT) 10 MG tablet Take 1 tablet (10 mg total) by mouth as needed for migraine. May repeat in 2 hours if needed. Max dose 20 mg in 1 day 10  tablet 5   Sodium Chloride 3 % AERS Saline Nasal Mist 3 %  spray nose deeply every hour or two and blow nose     topiramate (TOPAMAX) 50 MG tablet Take 150 mg by mouth in the morning and at bedtime.     traZODone (DESYREL) 50 MG tablet Take 1 tablet (50 mg total) by mouth at bedtime. 90 tablet 3   No current facility-administered medications on file prior to visit.    Review of Systems  Constitutional:  Negative for chills and fever.  Respiratory:  Negative for cough.   Cardiovascular:  Negative for chest pain and palpitations.  Gastrointestinal:  Positive for abdominal pain, constipation, diarrhea and nausea. Negative for vomiting.      Objective:    BP 136/70   Pulse 80   Temp 98 F (36.7 C)   Ht 5' 7.5" (1.715 m)   Wt 202 lb 9.6 oz (91.9 kg)   LMP 12/15/2015 Comment: neg urine test   SpO2 97%   BMI 31.26 kg/m  BP Readings from Last 3 Encounters:  06/14/23 136/70  05/03/23 128/82  04/12/23 121/67   Wt Readings from Last 3 Encounters:  06/14/23 202 lb 9.6 oz (91.9 kg)  05/26/23 203 lb 6.4 oz (92.3 kg)  05/04/23 203 lb (92.1 kg)     Physical Exam Vitals reviewed.  Constitutional:      Appearance: Normal appearance. She is well-developed.  Eyes:     Conjunctiva/sclera: Conjunctivae normal.  Cardiovascular:     Rate and Rhythm: Normal rate and regular rhythm.     Pulses: Normal pulses.     Heart sounds: Normal heart sounds.  Pulmonary:     Effort: Pulmonary effort is normal.     Breath sounds: Normal breath sounds. No wheezing, rhonchi or rales.  Abdominal:     General: Bowel sounds are normal. There is no distension.     Palpations: Abdomen is soft. Abdomen is not rigid. There is no fluid wave or mass.     Tenderness: There is abdominal tenderness. There is no guarding or rebound.     Comments: Diffuse non exquisite tenderness on exam.  Patient is not guarding.  Skin:    General: Skin is warm and dry.  Neurological:     Mental Status: She is alert.  Psychiatric:        Speech: Speech normal.        Behavior: Behavior normal.        Thought Content: Thought content normal.

## 2023-06-14 NOTE — Assessment & Plan Note (Addendum)
Chronic, stable.  Improved today as she describes being excited about her move to new apartment and new beginning. Continue paxil 10mg  every day, Cymbalta 60 mg every day,  trazodone 50 mg every day.  Referral placed to psychiatry

## 2023-06-15 ENCOUNTER — Encounter: Payer: Self-pay | Admitting: Family

## 2023-06-15 DIAGNOSIS — R6881 Early satiety: Secondary | ICD-10-CM | POA: Diagnosis not present

## 2023-06-15 DIAGNOSIS — R1013 Epigastric pain: Secondary | ICD-10-CM | POA: Diagnosis not present

## 2023-06-15 DIAGNOSIS — R1084 Generalized abdominal pain: Secondary | ICD-10-CM | POA: Diagnosis not present

## 2023-06-20 ENCOUNTER — Encounter: Payer: Self-pay | Admitting: Family

## 2023-06-30 ENCOUNTER — Encounter (INDEPENDENT_AMBULATORY_CARE_PROVIDER_SITE_OTHER): Payer: Self-pay

## 2023-07-01 ENCOUNTER — Telehealth: Payer: Self-pay | Admitting: Family

## 2023-07-01 DIAGNOSIS — K529 Noninfective gastroenteritis and colitis, unspecified: Secondary | ICD-10-CM | POA: Insufficient documentation

## 2023-07-02 NOTE — Telephone Encounter (Signed)
Message blank

## 2023-07-05 ENCOUNTER — Encounter: Payer: Self-pay | Admitting: Family

## 2023-07-05 NOTE — Telephone Encounter (Signed)
Allegra Grana, FNP routed conversation to [employer] sent to your clinical pool 4 days ago with nothing attached.

## 2023-07-07 ENCOUNTER — Ambulatory Visit: Payer: Medicare Other | Admitting: Family

## 2023-07-07 DIAGNOSIS — K6389 Other specified diseases of intestine: Secondary | ICD-10-CM | POA: Diagnosis not present

## 2023-07-07 DIAGNOSIS — T40605A Adverse effect of unspecified narcotics, initial encounter: Secondary | ICD-10-CM | POA: Diagnosis not present

## 2023-07-07 DIAGNOSIS — K5903 Drug induced constipation: Secondary | ICD-10-CM | POA: Diagnosis not present

## 2023-07-07 DIAGNOSIS — T402X5A Adverse effect of other opioids, initial encounter: Secondary | ICD-10-CM | POA: Diagnosis not present

## 2023-07-16 ENCOUNTER — Ambulatory Visit (INDEPENDENT_AMBULATORY_CARE_PROVIDER_SITE_OTHER): Payer: Medicare Other | Admitting: Family

## 2023-07-16 ENCOUNTER — Telehealth: Payer: Self-pay

## 2023-07-16 ENCOUNTER — Encounter: Payer: Self-pay | Admitting: Family

## 2023-07-16 VITALS — BP 134/84 | HR 81 | Temp 98.0°F | Ht 67.5 in | Wt 200.6 lb

## 2023-07-16 DIAGNOSIS — G20A1 Parkinson's disease without dyskinesia, without mention of fluctuations: Secondary | ICD-10-CM | POA: Diagnosis not present

## 2023-07-16 NOTE — Progress Notes (Unsigned)
Assessment & Plan:  There are no diagnoses linked to this encounter.   Return precautions given.   Risks, benefits, and alternatives of the medications and treatment plan prescribed today were discussed, and patient expressed understanding.   Education regarding symptom management and diagnosis given to patient on AVS either electronically or printed.  No follow-ups on file.  [name] [name], [name] [name]  Subjective:    [name], female    DOB: [birth year], 57 y.o.   MRN: [id number]  CC: [name] [name] is a 57 y.o. female who presents today for follow up.   HPI: She has had LTD through job in [facility], and when sick with SLE in 2005. She was provided LTD through [employer]. She has been on disability since 2005.   She is here today  , Patient had follow-up with Dr. Mia Creek 07/07/2023.  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  Prescribed Linzess 145 mcg once daily.  Neurology appointment scheduled at [facility] 09/06/2023.   Allergies: Bee venom, Fish allergy, Latex, Peanut-containing drug products, Prasterone, Shellfish allergy, Shellfish-derived products, Compazine [prochlorperazine edisylate], Dhea [nutritional supplements], Dilaudid [hydromorphone hcl], Famotidine, Lactose, Lactose intolerance (gi), Other, Prochlorperazine, Promethazine, Promethazine hcl, Reclast [zoledronic acid], Clarithromycin, Clotrimazole, Hydromorphone hcl, and Penicillins Current Outpatient Medications on File Prior to Visit  Medication Sig Dispense Refill   albuterol (PROVENTIL) (2.5 MG/3ML) 0.083% nebulizer solution Take 3 mLs (2.5 mg total) by nebulization every 6 (six) hours as needed for wheezing or shortness of breath. 150 mL 12   albuterol (VENTOLIN HFA) 108 (90 Base) MCG/ACT inhaler Inhale 1-2 puffs into the lungs every 4 (four) hours as needed for wheezing or shortness of breath. 18 g 11   Ascorbic Acid (VITAMIN C) 500 MG CHEW Chew 2,500 mg by mouth  daily.     Biotin 5000 MCG CHEW Chew 2 each by mouth daily.     carbidopa-levodopa (SINEMET CR) 50-200 MG tablet Take 1 tablet by mouth 5 (five) times daily.     Cholecalciferol (VITAMIN D3) 2000 units capsule Vitamin D3 2,000 unit capsule     colestipol (COLESTID) 1 g tablet Take 3 tablets (3 g total) by mouth 2 (two) times daily. 180 tablet 1   dexlansoprazole (DEXILANT) 60 MG capsule Take 1 capsule (60 mg total) by mouth daily. 90 capsule 3   diazepam (VALIUM) 5 MG tablet Take two 5 mg tablets 2  times daily     diclofenac sodium (VOLTAREN) 1 % GEL Apply 2 g topically 4 (four) times daily as needed (for knee & finger pain.).      dicyclomine (BENTYL) 20 MG tablet Take 1 tablet (20 mg total) by mouth every 6 (six) hours. (Patient taking differently: Take 20 mg by mouth every 6 (six) hours. Pt is taking 3 times a daily) 120 tablet 2   DULoxetine (CYMBALTA) 60 MG capsule Take 1 capsule (60 mg total) by mouth daily. 90 capsule 1   EPINEPHrine 0.3 mg/0.3 mL IJ SOAJ injection Inject 0.3 mg into the muscle as needed for anaphylaxis. 1 each 2   famotidine (PEPCID) 20 MG tablet Take 1 tablet (20 mg total) by mouth at bedtime. 30 tablet 2   folic acid (FOLVITE) 1 MG tablet Take 1 tablet (1 mg total) by mouth daily. 90 tablet 3   HYDROcodone-acetaminophen (NORCO/VICODIN) 5-325 MG tablet Take 2x"s daily     hydroxychloroquine (PLAQUENIL) 200 MG tablet Plaquenil 200 mg tablet  Take 1 tablet twice a day by oral route  after meals for 90 days.     lactase (LACTAID) 3000 units tablet Take 3,000 Units by mouth 3 (three) times daily with meals.     latanoprost (XALATAN) 0.005 % ophthalmic solution 1 drop at bedtime.     levothyroxine (SYNTHROID) 75 MCG tablet TAKE 1 TABLET BY MOUTH DAILY 30 MINUTES BEFORE BREAKFAST 90 tablet 3   meclizine (ANTIVERT) 12.5 MG tablet Take 1 tablet (12.5 mg total) by mouth 2 (two) times daily as needed for dizziness. 30 tablet 1   methocarbamol (ROBAXIN) 750 MG tablet Take 1 tablet  (750 mg total) by mouth every 6 (six) hours as needed. 120 tablet 5   montelukast (SINGULAIR) 10 MG tablet TAKE 1 TABLET BY MOUTH EVERY NIGHT AT BEDTIME 90 tablet 3   Multiple Vitamins-Minerals (MULTI-VITAMIN GUMMIES PO) Multi Vitamin  GUMMIES     ondansetron (ZOFRAN-ODT) 8 MG disintegrating tablet Take 1 tablet (8 mg total) by mouth every 8 (eight) hours as needed for nausea or vomiting. 30 tablet 2   PARoxetine (PAXIL) 10 MG tablet Take 1 tablet (10 mg total) by mouth daily. 90 tablet 0   Polyvinyl Alcohol-Povidone (REFRESH OP) Place 1 drop into both eyes 2 (two) times daily.     Probiotic Product (PROBIOTIC PO) Probiotic 10 billion cell capsule  daily     rizatriptan (MAXALT) 10 MG tablet Take 1 tablet (10 mg total) by mouth as needed for migraine. May repeat in 2 hours if needed. Max dose 20 mg in 1 day 10 tablet 5   Sodium Chloride 3 % AERS Saline Nasal Mist 3 %  spray nose deeply every hour or two and blow nose     topiramate (TOPAMAX) 50 MG tablet Take 150 mg by mouth in the morning and at bedtime.     traZODone (DESYREL) 50 MG tablet Take 1 tablet (50 mg total) by mouth at bedtime. 90 tablet 3   No current facility-administered medications on file prior to visit.    Review of Systems    Objective:    BP 134/84   Pulse 81   Temp 98 F (36.7 C)   Ht 5' 7.5" (1.715 m)   Wt 200 lb 9.6 oz (91 kg)   LMP 12/15/2015 Comment: neg urine test   SpO2 98%   BMI 30.95 kg/m  BP Readings from Last 3 Encounters:  07/16/23 134/84  06/14/23 136/70  05/03/23 128/82   Wt Readings from Last 3 Encounters:  07/16/23 200 lb 9.6 oz (91 kg)  06/14/23 202 lb 9.6 oz (91.9 kg)  05/26/23 203 lb 6.4 oz (92.3 kg)    Physical Exam

## 2023-07-16 NOTE — Telephone Encounter (Signed)
Pt WAS IN OFFICE ON TODAY WITH DISABILITY FOMRS AND SHE INFORMED PROVIDER SHE WOULD BE BY ON MONDAY TO PICK IT UP FORMS HAVE BEEN PLACED IN DESIGNATED PICK UP ARE UP FRONT IN ENVELOPE WITH LABEL

## 2023-07-19 NOTE — Assessment & Plan Note (Addendum)
Chronic, stable. Completed renewal for LTD.  Neurology appointment scheduled at [facility] 09/06/2023, Dr Willeen Cass and then also with Dr Thad Ranger, 01/17/23.  Will follow

## 2023-07-29 ENCOUNTER — Encounter: Payer: Self-pay | Admitting: Family

## 2023-07-30 ENCOUNTER — Other Ambulatory Visit: Payer: Self-pay | Admitting: Family

## 2023-08-03 NOTE — Telephone Encounter (Signed)
Patient called office back and a appointment was scheduled for 08/09/2023

## 2023-08-03 NOTE — Telephone Encounter (Signed)
LVM to call back to schedule appt to discuss back pain

## 2023-08-04 ENCOUNTER — Encounter: Payer: Self-pay | Admitting: Family

## 2023-08-09 ENCOUNTER — Encounter: Payer: Self-pay | Admitting: Family

## 2023-08-09 ENCOUNTER — Ambulatory Visit (INDEPENDENT_AMBULATORY_CARE_PROVIDER_SITE_OTHER): Payer: Medicare Other | Admitting: Family

## 2023-08-09 VITALS — BP 130/78 | HR 76 | Temp 99.0°F | Ht 69.0 in | Wt 197.2 lb

## 2023-08-09 DIAGNOSIS — Z23 Encounter for immunization: Secondary | ICD-10-CM | POA: Diagnosis not present

## 2023-08-09 DIAGNOSIS — F32A Depression, unspecified: Secondary | ICD-10-CM

## 2023-08-09 DIAGNOSIS — F419 Anxiety disorder, unspecified: Secondary | ICD-10-CM

## 2023-08-09 DIAGNOSIS — M2550 Pain in unspecified joint: Secondary | ICD-10-CM | POA: Diagnosis not present

## 2023-08-09 DIAGNOSIS — G43909 Migraine, unspecified, not intractable, without status migrainosus: Secondary | ICD-10-CM

## 2023-08-09 DIAGNOSIS — R110 Nausea: Secondary | ICD-10-CM

## 2023-08-09 DIAGNOSIS — L989 Disorder of the skin and subcutaneous tissue, unspecified: Secondary | ICD-10-CM

## 2023-08-09 DIAGNOSIS — G8929 Other chronic pain: Secondary | ICD-10-CM

## 2023-08-09 DIAGNOSIS — M545 Low back pain, unspecified: Secondary | ICD-10-CM

## 2023-08-09 MED ORDER — DULOXETINE HCL 60 MG PO CPEP
60.0000 mg | ORAL_CAPSULE | Freq: Two times a day (BID) | ORAL | 1 refills | Status: AC
Start: 2023-08-09 — End: ?

## 2023-08-09 MED ORDER — TOPIRAMATE 100 MG PO TABS: 150.0000 mg | ORAL_TABLET | Freq: Two times a day (BID) | ORAL | 2 refills | Status: DC

## 2023-08-09 MED ORDER — TRIAMCINOLONE ACETONIDE 0.5 % EX OINT: 1.0000 | TOPICAL_OINTMENT | Freq: Two times a day (BID) | CUTANEOUS | 2 refills | Status: DC

## 2023-08-09 MED ORDER — ONDANSETRON 8 MG PO TBDP
8.0000 mg | ORAL_TABLET | Freq: Three times a day (TID) | ORAL | 2 refills | Status: DC | PRN
Start: 2023-08-09 — End: 2023-10-06

## 2023-08-09 NOTE — Assessment & Plan Note (Addendum)
Complicated by osteoporosis, fibromyalgia, Parkinson's disease. Intolerant to gabapentin. Question if lyrica would be safer/more effective than norco.   We did discussed migraines, overuse headache and advised her to stop ibuprofen 600 mg daily.  Discussed Tylenol arthritis with safer profile.Schedule tylenol arthritis , taking 650mg  tablet in the morning and afternoon.Advised that she may take robaxin 750mg  in the morning as needed.   Discussed polypharmacy and risk of sedation.  We discussed limiting hydrocodone/acetaminophen 5-325 mg 1 tablet at bedtime.  She is taking it with diazepam 5 mg as prescribed by pain management, Dr. Park Breed.  She politely declines referral to physical therapy due to cost.  consider orthopedic referral for EMG study Close follow up.

## 2023-08-09 NOTE — Progress Notes (Signed)
Assessment & Plan:  Other chronic pain -     DULoxetine HCl; Take 1 capsule (60 mg total) by mouth 2 (two) times daily.  Dispense: 120 capsule; Refill: 1  Arthralgia, unspecified joint -     DULoxetine HCl; Take 1 capsule (60 mg total) by mouth 2 (two) times daily.  Dispense: 120 capsule; Refill: 1  Anxiety and depression -     DULoxetine HCl; Take 1 capsule (60 mg total) by mouth 2 (two) times daily.  Dispense: 120 capsule; Refill: 1  Skin lesion Assessment & Plan: Etiology unclear.  Question of keloid.  Trial of triamcinolone. Advised to follow up with dermatology for diagnosis and treatment. Lesion may require biopsy.   Orders: -     Triamcinolone Acetonide; Apply 1 Application topically 2 (two) times daily.  Dispense: 30 g; Refill: 2  Nausea -     Ondansetron; Take 1 tablet (8 mg total) by mouth every 8 (eight) hours as needed for nausea or vomiting.  Dispense: 30 tablet; Refill: 2  Migraine without status migrainosus, not intractable, unspecified migraine type -     Topiramate; Take 1.5 tablets (150 mg total) by mouth 2 (two) times daily.  Dispense: 90 tablet; Refill: 2 -     Ondansetron; Take 1 tablet (8 mg total) by mouth every 8 (eight) hours as needed for nausea or vomiting.  Dispense: 30 tablet; Refill: 2  Chronic bilateral low back pain without sciatica Assessment & Plan: Complicated by osteoporosis, fibromyalgia, Parkinson's disease. Intolerant to gabapentin. Question if lyrica would be safer/more effective than norco.   We did discussed migraines, overuse headache and advised her to stop ibuprofen 600 mg daily.  Discussed Tylenol arthritis with safer profile.Schedule tylenol arthritis , taking 650mg  tablet in the morning and afternoon.Advised that she may take robaxin 750mg  in the morning as needed.   Discussed polypharmacy and risk of sedation.  We discussed limiting hydrocodone/acetaminophen 5-325 mg 1 tablet at bedtime.  She is taking it with diazepam 5 mg as  prescribed by pain management, Dr. Park Breed.  She politely declines referral to physical therapy due to cost.  consider orthopedic referral for EMG study Close follow up.     Encounter for immunization -     Flu vaccine trivalent PF, 6mos and older(Flulaval,Afluria,Fluarix,Fluzone)     Return precautions given.   Risks, benefits, and alternatives of the medications and treatment plan prescribed today were discussed, and patient expressed understanding.   Education regarding symptom management and diagnosis given to patient on AVS either electronically or printed.  I have spent 40 minutes with a patient including precharting, exam, reviewing medical records and imaging of hip and back, and discussion plan of care.      No follow-ups on file.  Rennie Plowman, FNP  Subjective:    [name], female    DOB: 1966/08/31, 57 y.o.   MRN: [id number]  CC: [name] [name] is a 57 y.o. female who presents today for follow up.   HPI: Complains of chronic bilateral low back pain, x one year  Accompanied by her daughter She walks with a cane.   She feels ' stress' aggravates low back pain. Endorses stress of paying water and rent bill.      Walking and bending aggravates pain. Sitting to use the bathroom will aggravate pain.   She complains of numbness in bilateral hands, left leg and foot.  No saddle anesthesia, urinary or fecal incontinence.    Compliant with Voltaren gel,  Cymbalta 120 mg daily.   She is using voltaren gel, hydrocodone acetaminophen 5-325mg ; there are times that she takes one tablet norco BID if she 'home all day'. She is taking 600mg  iburpofen once daily.   she takes norco with diazepam 5mg  generally at 7pm and does not feel overly sedated.   She has not been taking Robaxin 750mg   as recommended by Dr Park Breed due to concern for sedation.   She follows with pain management, Dr Park Breed   history SLE, following with [facility]  rheumatology History of osteoporosis, following with [facility] [place], Dr Lucianne Muss  History of fibromyalgia, Parkinson's disease  X-ray right hip 05/07/2021 right hip ORIF, removal of surgical hardware.  Right hip joint space is preserved. Dr Rosita Kea hardware removal from right hip 01/2017   MRI cspine 04/17/20 moderate foraminal narrowing C3-C4, facet osteoarthritis multiple levels.  Degenerative anterolisthesis C4-C5.   MRI right hip 07/17/20 partial tear of the anterior right gluteus medius.  Healed right hip fracture.  Negative for vascular necrosis or secondary degenerative disease  MRI lumbar spine  07/05/22 with exaggeration of normal lumbar lordosis.  L4-L5 grade 1 anterolisthesis mild canal narrowing  History of appendectomy, cholecystectomy  She is taking topamax 150mg  BID. New neurology appointment next year; previously seen by [facility]  09/11/2022  B12 [id number]   She also notes scar on her abdomen.   Allergies: Bee venom, Fish allergy, Latex, Peanut-containing drug products, Prasterone, Shellfish allergy, Shellfish-derived products, Compazine [prochlorperazine edisylate], Dhea [nutritional supplements], Dilaudid [hydromorphone hcl], Famotidine, Gabapentin, Lactose, Lactose intolerance (gi), Other, Prochlorperazine, Promethazine, Promethazine hcl, Reclast [zoledronic acid], Clarithromycin, Clotrimazole, Hydromorphone hcl, and Penicillins Current Outpatient Medications on File Prior to Visit  Medication Sig Dispense Refill   albuterol (PROVENTIL) (2.5 MG/3ML) 0.083% nebulizer solution Take 3 mLs (2.5 mg total) by nebulization every 6 (six) hours as needed for wheezing or shortness of breath. 150 mL 12   albuterol (VENTOLIN HFA) 108 (90 Base) MCG/ACT inhaler INHALE 1 TO 2 PUFFS INTO THE LUNGS EVERY 4 HOURS AS NEEDED FOR WHEEZING OR SHORTNESS OF BREATH 18 g 11   Ascorbic Acid (VITAMIN C) 500 MG CHEW Chew 2,500 mg by mouth daily.     Biotin 5000 MCG CHEW Chew 2 each by mouth daily.      carbidopa-levodopa (SINEMET CR) 50-200 MG tablet Take 1 tablet by mouth 5 (five) times daily.     Cholecalciferol (VITAMIN D3) 2000 units capsule Vitamin D3 2,000 unit capsule     colestipol (COLESTID) 1 g tablet Take 3 tablets (3 g total) by mouth 2 (two) times daily. 180 tablet 1   dexlansoprazole (DEXILANT) 60 MG capsule Take 1 capsule (60 mg total) by mouth daily. 90 capsule 3   diazepam (VALIUM) 5 MG tablet Take two 5 mg tablets 2  times daily     diclofenac sodium (VOLTAREN) 1 % GEL Apply 2 g topically 4 (four) times daily as needed (for knee & finger pain.).      dicyclomine (BENTYL) 20 MG tablet Take 1 tablet (20 mg total) by mouth every 6 (six) hours. (Patient taking differently: Take 20 mg by mouth every 6 (six) hours. Pt is taking 3 times a daily) 120 tablet 2   EPINEPHrine 0.3 mg/0.3 mL IJ SOAJ injection Inject 0.3 mg into the muscle as needed for anaphylaxis. 1 each 2   famotidine (PEPCID) 20 MG tablet Take 1 tablet (20 mg total) by mouth at bedtime. 30 tablet 2   folic acid (FOLVITE) 1 MG tablet  Take 1 tablet (1 mg total) by mouth daily. 90 tablet 3   HYDROcodone-acetaminophen (NORCO/VICODIN) 5-325 MG tablet Take 2x"s daily     hydroxychloroquine (PLAQUENIL) 200 MG tablet Plaquenil 200 mg tablet  Take 1 tablet twice a day by oral route after meals for 90 days.     lactase (LACTAID) 3000 units tablet Take 3,000 Units by mouth 3 (three) times daily with meals.     latanoprost (XALATAN) 0.005 % ophthalmic solution 1 drop at bedtime.     levothyroxine (SYNTHROID) 75 MCG tablet TAKE 1 TABLET BY MOUTH DAILY 30 MINUTES BEFORE BREAKFAST 90 tablet 3   meclizine (ANTIVERT) 12.5 MG tablet Take 1 tablet (12.5 mg total) by mouth 2 (two) times daily as needed for dizziness. 30 tablet 1   methocarbamol (ROBAXIN) 750 MG tablet Take 1 tablet (750 mg total) by mouth every 6 (six) hours as needed. 120 tablet 5   montelukast (SINGULAIR) 10 MG tablet TAKE 1 TABLET BY MOUTH EVERY NIGHT AT BEDTIME 90 tablet  3   Multiple Vitamins-Minerals (MULTI-VITAMIN GUMMIES PO) Multi Vitamin  GUMMIES     PARoxetine (PAXIL) 10 MG tablet Take 1 tablet (10 mg total) by mouth daily. 90 tablet 0   Polyvinyl Alcohol-Povidone (REFRESH OP) Place 1 drop into both eyes 2 (two) times daily.     Probiotic Product (PROBIOTIC PO) Probiotic 10 billion cell capsule  daily     rizatriptan (MAXALT) 10 MG tablet Take 1 tablet (10 mg total) by mouth as needed for migraine. May repeat in 2 hours if needed. Max dose 20 mg in 1 day 10 tablet 5   Sodium Chloride 3 % AERS Saline Nasal Mist 3 %  spray nose deeply every hour or two and blow nose     traZODone (DESYREL) 50 MG tablet Take 1 tablet (50 mg total) by mouth at bedtime. 90 tablet 3   No current facility-administered medications on file prior to visit.    Review of Systems  Constitutional:  Negative for chills and fever.  Respiratory:  Negative for cough.   Cardiovascular:  Negative for chest pain and palpitations.  Gastrointestinal:  Negative for nausea and vomiting.  Musculoskeletal:  Positive for back pain.  Neurological:  Positive for numbness.  Psychiatric/Behavioral:  The patient is nervous/anxious.       Objective:    BP 130/78   Pulse 76   Temp 99 F (37.2 C) (Oral)   Ht 5\' 9"  (1.753 m)   Wt 197 lb 3.2 oz (89.4 kg)   LMP 12/15/2015 Comment: neg urine test   SpO2 98%   BMI 29.12 kg/m  BP Readings from Last 3 Encounters:  08/09/23 130/78  07/16/23 134/84  06/14/23 136/70   Wt Readings from Last 3 Encounters:  08/09/23 197 lb 3.2 oz (89.4 kg)  07/16/23 200 lb 9.6 oz (91 kg)  06/14/23 202 lb 9.6 oz (91.9 kg)    Physical Exam Vitals reviewed.  Constitutional:      Appearance: She is well-developed.  Eyes:     Conjunctiva/sclera: Conjunctivae normal.  Cardiovascular:     Rate and Rhythm: Normal rate and regular rhythm.     Pulses: Normal pulses.     Heart sounds: Normal heart sounds.  Pulmonary:     Effort: Pulmonary effort is normal.      Breath sounds: Normal breath sounds. No wheezing, rhonchi or rales.  Skin:    General: Skin is warm and dry.  Neurological:     Mental Status: She is  alert.  Psychiatric:        Speech: Speech normal.        Behavior: Behavior normal.        Thought Content: Thought content normal.     1cm nontender papule. Nonfluctuant. Area is not boggy. No increase in warmth.

## 2023-08-09 NOTE — Patient Instructions (Addendum)
Stop ibuprofen  Schedule tylenol arthritis , taking 650mg  tablet in the morning and afternoon  Continue ONE tablet hydrocodone acetaminophen 5-325mg .  Do not exceed more than 4000 mg of acetaminophen in 24 hours.   Take robaxin 750mg  in the morning as needed.   Do not drive or operate heavy machinery while on muscle relaxant. Please do not drink alcohol. Only take this medication as needed for acute muscle spasm at bedtime. This medication make you feel drowsy so be very careful.  Stop taking if become too drowsy or somnolent as this puts you at risk for falls. Please contact our office with any questions.   I prescribed triamcinolone for the suspected scar on your abdomen.  I would recommend follow-up with with dermatology for diagnosis/treatment. Marland Kitchen

## 2023-08-10 ENCOUNTER — Encounter: Payer: Self-pay | Admitting: Family

## 2023-08-11 NOTE — Telephone Encounter (Signed)
Spoke with patient offered appointment and scheduled for 08/26/23 and advised patient my chart is not for treating that if she these type has medical questions these need an appointment. Patient has an appointment with Psychiatry on Monday.

## 2023-08-16 ENCOUNTER — Encounter: Payer: Self-pay | Admitting: Family

## 2023-08-16 NOTE — Assessment & Plan Note (Signed)
Etiology unclear.  Question of keloid.  Trial of triamcinolone. Advised to follow up with dermatology for diagnosis and treatment. Lesion may require biopsy.

## 2023-08-23 DIAGNOSIS — G894 Chronic pain syndrome: Secondary | ICD-10-CM | POA: Diagnosis not present

## 2023-08-23 DIAGNOSIS — M25569 Pain in unspecified knee: Secondary | ICD-10-CM | POA: Diagnosis not present

## 2023-08-23 DIAGNOSIS — Z79891 Long term (current) use of opiate analgesic: Secondary | ICD-10-CM | POA: Diagnosis not present

## 2023-08-23 DIAGNOSIS — M542 Cervicalgia: Secondary | ICD-10-CM | POA: Diagnosis not present

## 2023-08-23 DIAGNOSIS — F1120 Opioid dependence, uncomplicated: Secondary | ICD-10-CM | POA: Diagnosis not present

## 2023-08-23 DIAGNOSIS — M79642 Pain in left hand: Secondary | ICD-10-CM | POA: Diagnosis not present

## 2023-08-26 ENCOUNTER — Ambulatory Visit: Payer: Medicare Other | Admitting: Family

## 2023-09-02 ENCOUNTER — Encounter: Payer: Self-pay | Admitting: Physician Assistant

## 2023-09-02 ENCOUNTER — Ambulatory Visit (INDEPENDENT_AMBULATORY_CARE_PROVIDER_SITE_OTHER): Payer: Medicare Other | Admitting: Physician Assistant

## 2023-09-02 VITALS — BP 120/86 | HR 100 | Temp 98.7°F | Ht 69.0 in | Wt 191.0 lb

## 2023-09-02 DIAGNOSIS — Z79899 Other long term (current) drug therapy: Secondary | ICD-10-CM | POA: Diagnosis not present

## 2023-09-02 DIAGNOSIS — M329 Systemic lupus erythematosus, unspecified: Secondary | ICD-10-CM

## 2023-09-02 DIAGNOSIS — E039 Hypothyroidism, unspecified: Secondary | ICD-10-CM

## 2023-09-02 DIAGNOSIS — K529 Noninfective gastroenteritis and colitis, unspecified: Secondary | ICD-10-CM

## 2023-09-02 DIAGNOSIS — K909 Intestinal malabsorption, unspecified: Secondary | ICD-10-CM

## 2023-09-02 DIAGNOSIS — R197 Diarrhea, unspecified: Secondary | ICD-10-CM

## 2023-09-02 DIAGNOSIS — K219 Gastro-esophageal reflux disease without esophagitis: Secondary | ICD-10-CM

## 2023-09-02 DIAGNOSIS — R1084 Generalized abdominal pain: Secondary | ICD-10-CM

## 2023-09-02 MED ORDER — COLESTIPOL HCL 1 G PO TABS: 3.0000 g | ORAL_TABLET | Freq: Two times a day (BID) | ORAL | 1 refills | Status: DC

## 2023-09-02 MED ORDER — DEXLANSOPRAZOLE 60 MG PO CPDR
60.0000 mg | DELAYED_RELEASE_CAPSULE | Freq: Every day | ORAL | 3 refills | Status: AC
Start: 2023-09-02 — End: ?

## 2023-09-02 MED ORDER — PREDNISONE 10 MG PO TABS
ORAL_TABLET | ORAL | 0 refills | Status: AC
Start: 2023-09-02 — End: 2023-09-08
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
## 2023-09-02 NOTE — Patient Instructions (Signed)
-  It was a pleasure to see you today! Please review your visit summary for helpful information -Lab results are usually available within 1-2 days and we will call once reviewed -I would encourage you to follow your care via MyChart where you can access lab results, notes, messages, and more -If you feel that we did a nice job today, please complete your after-visit survey and leave [place] a Google review! Your CMA today was Mariann Barter and your provider was Alvester Morin, PA-C, DMSc -Please return for follow-up in about 3 months

## 2023-09-03 LAB — COMPREHENSIVE METABOLIC PANEL
ALT: 24 [IU]/L (ref 0–32)
AST: 20 [IU]/L (ref 0–40)
Albumin: 4.9 g/dL (ref 3.8–4.9)
Alkaline Phosphatase: 103 [IU]/L (ref 44–121)
BUN/Creatinine Ratio: 15 (ref 9–23)
BUN: 13 mg/dL (ref 6–24)
Bilirubin Total: <0.2 mg/dL (ref 0.0–1.2)
CO2: 19 mmol/L — ABNORMAL LOW (ref 20–29)
Calcium: 9.5 mg/dL (ref 8.7–10.2)
Chloride: 107 mmol/L — ABNORMAL HIGH (ref 96–106)
Creatinine, Ser: 0.88 mg/dL (ref 0.57–1.00)
Globulin, Total: 2.2 g/dL (ref 1.5–4.5)
Glucose: 94 mg/dL (ref 70–99)
Potassium: 4.2 mmol/L (ref 3.5–5.2)
Sodium: 141 mmol/L (ref 134–144)
Total Protein: 7.1 g/dL (ref 6.0–8.5)
eGFR: 76 mL/min/{1.73_m2} (ref >59)

## 2023-09-03 LAB — TSH: TSH: 1.73 u[IU]/mL (ref 0.450–4.500)

## 2023-09-06 ENCOUNTER — Telehealth: Payer: Self-pay

## 2023-09-06 NOTE — Telephone Encounter (Signed)
Patient called in wanting to schedule appointment with our office. I checked to make sure patient is not established with another GI. Patient is established with [facility]. I was explaining to her that we will have to get permission from our provider because she is a patient of [facility]. She was stating that she no longer see them and I did informed her she is still established with [facility]. She just seen them on 07/07/2023 and has another appointment schedule. As I was explaining this the patient hang up.

## 2023-09-06 NOTE — Telephone Encounter (Signed)
Please review.  KP

## 2023-09-06 NOTE — Telephone Encounter (Signed)
Dr. Reuel Boom call in for the patient wanting to check on her referral and schedule her. I explained to him that she is established with [facility] I would have to get the provider approval. I let him know as I was trying to explain this to her she was being rude and she hang up in my face. I let know that I didn't send anything to the providers because I didn't know why she want to transfer her care. He asked if I can send a message letting the providers know that she wants to transfer her for severe diarrhea. I advised him that our office don't do second opinion due to we are backed up.

## 2023-09-07 ENCOUNTER — Other Ambulatory Visit: Payer: Self-pay | Admitting: Family

## 2023-09-07 ENCOUNTER — Other Ambulatory Visit: Payer: Self-pay | Admitting: Physician Assistant

## 2023-09-07 DIAGNOSIS — K529 Noninfective gastroenteritis and colitis, unspecified: Secondary | ICD-10-CM

## 2023-09-07 NOTE — Telephone Encounter (Signed)
FYI  KP

## 2023-09-08 ENCOUNTER — Telehealth: Payer: Self-pay

## 2023-09-08 NOTE — Telephone Encounter (Signed)
PA completed waiting for insurance approval.  Key: [id number]  KP

## 2023-09-10 NOTE — Telephone Encounter (Signed)
Please review.  KP

## 2023-09-15 ENCOUNTER — Encounter: Payer: Self-pay | Admitting: Physician Assistant

## 2023-09-15 ENCOUNTER — Ambulatory Visit (INDEPENDENT_AMBULATORY_CARE_PROVIDER_SITE_OTHER): Payer: Medicare Other | Admitting: Physician Assistant

## 2023-09-15 VITALS — BP 128/80 | HR 96 | Ht 69.0 in | Wt 186.0 lb

## 2023-09-15 DIAGNOSIS — R5383 Other fatigue: Secondary | ICD-10-CM

## 2023-09-15 DIAGNOSIS — R0609 Other forms of dyspnea: Secondary | ICD-10-CM | POA: Diagnosis not present

## 2023-09-15 DIAGNOSIS — E559 Vitamin D deficiency, unspecified: Secondary | ICD-10-CM

## 2023-09-15 DIAGNOSIS — M8580 Other specified disorders of bone density and structure, unspecified site: Secondary | ICD-10-CM | POA: Diagnosis not present

## 2023-09-15 DIAGNOSIS — Z1321 Encounter for screening for nutritional disorder: Secondary | ICD-10-CM

## 2023-09-15 NOTE — Progress Notes (Signed)
Date:  09/15/2023   Name:  [name] Putt, MD   DOB:  09/17/1966   MRN:  [id number]   Chief Complaint: Fatigue (Lethargy, dizzy, heart pounding, SOB, cold )  HPI [name] [name] presents today joined by her daughter to discuss recently worsening fatigue/lethargy along with other symptoms to include dizziness, palpitations, dyspnea on exertion, and cold intolerance.  These concerns were also brought to my attention at her last visit, but thought to be possibly related to what was severe diarrhea at the time.  CMP and TSH on 09/02/2023 were both normal.  Last CBC 06/06/2023 was normal without anemia.  Has visit with cardiology Dr. Mariah Milling 10/04/2023.  Chart review of imaging shows a CT of the chest in 2020 significant for mild cardiomegaly in October and November 2020, no imaging since.  EKG 06/06/2023 which I cannot see directly but with interpretation as follows: "NORMAL SINUS RHYTHM MINIMAL VOLTAGE CRITERIA FOR LVH, MAY BE NORMAL VARIANT ( R in aVL ) BORDERLINE ECG WHEN COMPARED WITH ECG OF 28-Sep-2022 16:36, ST ELEVATION NOW PRESENT IN INFERIOR LEADS".  Extensive cardiac workup between 2018 and 2020 was normal  Thankfully diarrhea has improved, though still having intermittent abdominal pains.  Saw psychiatry yesterday with plan for once monthly visits.  Patient states she cannot find her lorazepam, but intends to look for it.  She expresses significant concern regarding her dyspnea on exertion and limited ability to go anywhere or do anything.  This is really taking a toll on her mental health.    Medication list has been reviewed and updated.  Current Meds  Medication Sig   albuterol (PROVENTIL) (2.5 MG/3ML) 0.083% nebulizer solution Take 3 mLs (2.5 mg total) by nebulization every 6 (six) hours as needed for wheezing or shortness of breath.   albuterol (VENTOLIN HFA) 108 (90 Base) MCG/ACT inhaler INHALE 1 TO 2 PUFFS INTO THE LUNGS EVERY 4 HOURS AS NEEDED FOR WHEEZING OR SHORTNESS OF BREATH    carbidopa-levodopa (SINEMET CR) 50-200 MG tablet Take 1 tablet by mouth 5 (five) times daily.   colestipol (COLESTID) 1 g tablet Take 3 tablets (3 g total) by mouth 2 (two) times daily.   dexlansoprazole (DEXILANT) 60 MG capsule Take 1 capsule (60 mg total) by mouth daily.   diclofenac sodium (VOLTAREN) 1 % GEL Apply 2 g topically 4 (four) times daily as needed (for knee & finger pain.).    dicyclomine (BENTYL) 20 MG tablet Take 1 tablet (20 mg total) by mouth every 6 (six) hours. (Patient taking differently: Take 20 mg by mouth every 6 (six) hours. Pt is taking 3 times a daily)   DULoxetine (CYMBALTA) 60 MG capsule Take 1 capsule (60 mg total) by mouth 2 (two) times daily.   EPINEPHrine 0.3 mg/0.3 mL IJ SOAJ injection Inject 0.3 mg into the muscle as needed for anaphylaxis.   HYDROcodone-acetaminophen (NORCO/VICODIN) 5-325 MG tablet Take 2x"s daily   hydroxychloroquine (PLAQUENIL) 200 MG tablet Plaquenil 200 mg tablet  Take 1 tablet twice a day by oral route after meals for 90 days.   lactase (LACTAID) 3000 units tablet Take 3,000 Units by mouth 3 (three) times daily with meals.   latanoprost (XALATAN) 0.005 % ophthalmic solution 1 drop at bedtime.   levothyroxine (SYNTHROID) 75 MCG tablet TAKE 1 TABLET BY MOUTH DAILY 30 MINUTES BEFORE BREAKFAST   methocarbamol (ROBAXIN) 750 MG tablet Take 1 tablet (750 mg total) by mouth every 6 (six) hours as needed.   montelukast (SINGULAIR) 10 MG tablet TAKE  1 TABLET BY MOUTH EVERY NIGHT AT BEDTIME   Multiple Vitamins-Minerals (MULTI-VITAMIN GUMMIES PO) Multi Vitamin  GUMMIES   ondansetron (ZOFRAN-ODT) 8 MG disintegrating tablet Take 1 tablet (8 mg total) by mouth every 8 (eight) hours as needed for nausea or vomiting.   PAXIL 20 MG tablet    Polyvinyl Alcohol-Povidone (REFRESH OP) Place 1 drop into both eyes 2 (two) times daily.   triamcinolone ointment (KENALOG) 0.5 % Apply 1 Application topically 2 (two) times daily.     Review of Systems   Constitutional:  Positive for chills and fatigue. Negative for fever.  Respiratory:  Positive for chest tightness and shortness of breath. Negative for wheezing.   Cardiovascular:  Positive for palpitations. Negative for chest pain.  Gastrointestinal:  Negative for abdominal pain.  Endocrine: Positive for cold intolerance.  Psychiatric/Behavioral:  Positive for dysphoric mood and sleep disturbance. The patient is nervous/anxious.     Patient Active Problem List   Diagnosis Date Noted   Polypharmacy 09/02/2023   Severe diarrhea 09/22/2022   Chronic low back pain 07/23/2022   Lumbar facet arthropathy 07/23/2022   Cyst of right kidney 07/23/2022   Prediabetes 11/06/2021   Deformity of both feet 06/05/2020   CTS (carpal tunnel syndrome) 11/17/2019   Vitamin D deficiency 07/04/2019   Chronic venous insufficiency 04/18/2019   Parkinson disease (HCC) 04/03/2019   Connective tissue disorder (HCC) 02/14/2019   Hiatal hernia 10/06/2018   ADHD 08/04/2018   Lupus arthritis (HCC) 08/04/2018   Asthma 08/04/2018   Systemic lupus erythematosus (HCC) 08/04/2018   Urinary incontinence 08/04/2018   Glaucoma 08/04/2018   Migraines 08/04/2018   Mitral valve regurgitation 08/04/2018   Neuropathy 08/04/2018   OCD (obsessive compulsive disorder) 08/04/2018   Osteopenia 08/04/2018   Thrombocytopenia (HCC) 07/06/2018   Allergic rhinitis 12/16/2017   Chronic constipation 12/16/2017   Fibromyalgia 11/04/2017   Sicca syndrome (HCC) 06/04/2014   Chronic pain 03/31/2011   Overactive bladder 10/14/2009   Optic neuritis 11/08/2007   Polyarthropathy or polyarthritis of multiple sites 08/19/2007   Hypothyroidism 06/06/2007   Anxiety and depression 06/06/2007   Depression, recurrent (HCC) 06/06/2007   GERD 06/06/2007    Allergies  Allergen Reactions   Bee Venom Anaphylaxis, Itching and Swelling    Affected area Affected area Affected area Affected area    Fish Allergy Hives, Swelling and Rash     Facial swelling  Facial swelling  Facial swelling Facial swelling  Facial swelling    Latex Anaphylaxis, Rash and Shortness Of Breath    Rash  Rash     Peanut-Containing Drug Products Anaphylaxis   Prasterone Other (See Comments) and Nausea And Vomiting    rash Other reaction(s): Headache Headaches.   Shellfish Allergy Hives, Other (See Comments), Rash and Swelling    Facial swelling Uncoded Allergy. Allergen: seafood Uncoded Allergy. Allergen: CATS, Other Reaction: itch, wheezing Uncoded Allergy. Allergen: COMPAZINE, Other Reaction: tremors Facial swelling Facial swelling Uncoded Allergy. Allergen: seafood Uncoded Allergy. Allergen: CATS, Other Reaction: itch, wheezing Uncoded Allergy. Allergen: COMPAZINE, Other Reaction: tremors Facial swelling Uncoded Allergy. Allergen: seafood Uncoded Allergy. Allergen: CATS, Other Reaction: itch, wheezing Uncoded Allergy. Allergen: COMPAZINE, Other Reaction: tremors Facial swelling Facial swelling Uncoded Allergy. Allergen: seafood Uncoded Allergy. Allergen: CATS, Other Reaction: itch, wheezing Uncoded Allergy. Allergen: COMPAZINE, Other Reaction: tremors Facial swelling Facial swelling Uncoded Allergy. Allergen: seafood Uncoded Allergy. Allergen: CATS, Other Reaction: itch, wheezing Uncoded Allergy. Allergen: COMPAZINE, Other Reaction: tremors    Shellfish-Derived Products Hives, Other (See Comments), Rash and Swelling  Facial swelling Uncoded Allergy. Allergen: seafood Uncoded Allergy. Allergen: CATS, Other Reaction: itch, wheezing Uncoded Allergy. Allergen: COMPAZINE, Other Reaction: tremors Facial swelling Facial swelling Uncoded Allergy. Allergen: seafood Uncoded Allergy. Allergen: CATS, Other Reaction: itch, wheezing Uncoded Allergy. Allergen: COMPAZINE, Other Reaction: tremors Facial swelling Uncoded Allergy. Allergen: seafood Uncoded Allergy. Allergen: CATS, Other Reaction: itch, wheezing Uncoded Allergy.  Allergen: COMPAZINE, Other Reaction: tremors Facial swelling   Compazine [Prochlorperazine Edisylate] Other (See Comments)    tremors   Dhea [Nutritional Supplements] Other (See Comments)    Headaches.   Dilaudid [Hydromorphone Hcl]     ? reaction   Famotidine Other (See Comments) and Diarrhea    Other reaction(s): Headache Gas   Gabapentin Other (See Comments)    'snowed out'    Lactose    Lactose Intolerance (Gi)    Other Other (See Comments)    Other reaction(s): Unknown    Prochlorperazine     Other reaction(s): Other (See Comments) ticks   Promethazine     Other reaction(s): Other (See Comments) Ticks   Promethazine Hcl Other (See Comments)    CNS disorder   Reclast [Zoledronic Acid]     Weakness could not move limbs, fatigue, increase sleep   Clarithromycin Hives and Rash   Clotrimazole Swelling and Rash   Hydromorphone Hcl Rash and Hives    "Rash all over"   Penicillins Rash and Other (See Comments)    Has patient had a PCN reaction causing immediate rash, facial/tongue/throat swelling, SOB or lightheadedness with hypotension:No Has patient had a PCN reaction causing severe rash involving mucus membranes or skin necrosis:No Has patient had a PCN reaction that required hospitalization:No Has patient had a PCN reaction occurring within the last 10 years:No If all of the above answers are "NO", then may proceed with Cephalosporin use.    Immunization History  Administered Date(s) Administered   Influenza Inj Mdck Quad Pf 08/30/2017   Influenza Split 08/13/2011, 08/19/2012, 09/18/2013   Influenza Whole 09/13/2007, 09/12/2008, 08/06/2010   Influenza, Seasonal, Injecte, Preservative Fre 08/09/2023   Influenza,inj,Quad PF,6+ Mos 08/13/2011, 08/19/2012, 09/18/2013, 08/21/2014, 09/18/2014, 09/02/2015, 09/01/2016, 08/30/2017, 08/19/2018, 07/28/2019, 08/06/2020, 08/21/2021, 07/23/2022   Influenza-Unspecified 09/02/2015, 08/30/2017   PFIZER(Purple Top)SARS-COV-2 Vaccination  03/19/2020, 04/09/2020, 11/06/2020   Pfizer Covid-19 Vaccine Bivalent Booster 34yrs & up 04/21/2021, 10/21/2021   Pneumococcal Conjugate-13 11/30/2004, 06/04/2014, 09/03/2014   Pneumococcal Polysaccharide-23 11/30/2004, 09/05/2012, 01/21/2022   Td 11/30/2004   Tdap 09/11/2014, 11/05/2014, 10/19/2016    Past Surgical History:  Procedure Laterality Date   ADENOIDECTOMY     APPENDECTOMY     CERVICAL BIOPSY  W/ LOOP ELECTRODE EXCISION  2005   CHOLECYSTECTOMY     COLONOSCOPY N/A 10/16/2022   Procedure: COLONOSCOPY;  Surgeon: Regis Bill, MD;  Location: [facility] ENDOSCOPY;  Service: Gastroenterology;  Laterality: N/A;   ESOPHAGOGASTRODUODENOSCOPY N/A 10/16/2022   Procedure: ESOPHAGOGASTRODUODENOSCOPY (EGD);  Surgeon: Regis Bill, MD;  Location: [facility] ENDOSCOPY;  Service: Gastroenterology;  Laterality: N/A;   ESOPHAGOGASTRODUODENOSCOPY (EGD) WITH PROPOFOL N/A 08/27/2017   Procedure: ESOPHAGOGASTRODUODENOSCOPY (EGD) WITH PROPOFOL;  Surgeon: Scot Jun, MD;  Location[facility] ENDOSCOPY;  Service: Endoscopy;  Laterality: N/A;   FRACTURE SURGERY  11/30/2008   HAND SURGERY     repair of left metatarsal    HARDWARE REMOVAL Right 02/16/2017   Procedure: HARDWARE REMOVAL FROM HIP;  Surgeon: Kennedy Bucker, MD;  Location: [facility] ORS;  Service: Orthopedics;  Laterality: Right;   JOINT REPLACEMENT     OTHER SURGICAL HISTORY     left 3rd metatarsal  fracture repair 2011    REVISION TOTAL HIP ARTHROPLASTY  06/2009   replacement 2010 then screw and plate removal 7829; right hip   TONSILLECTOMY     TONSILLECTOMY     wrist  ligament repair bilateral     WRIST RECONSTRUCTION     WRIST SURGERY     laceration of right wrist ligaments   wrist surgery     laceration of left wrist surgery     Social History   Tobacco Use   Smoking status: Never   Smokeless tobacco: Never  Vaping Use   Vaping status: Never Used  Substance Use Topics   Alcohol use: No   Drug use: No    Family History   Problem Relation Age of Onset   Hypertension Mother    Arthritis Mother    Heart disease Mother        afib   Asthma Mother    Asthma Sister    Asthma Daughter    Arthritis Daughter    Heart disease Daughter        ?heart condition on BB   ADD / ADHD Daughter    Depression Daughter    Intellectual disability Daughter    Learning disabilities Daughter    Pancreatic cancer Father    Cancer Father        pancreatitic    Diabetes Maternal Grandmother    Heart disease Maternal Grandmother    Arthritis Maternal Grandmother    Hypertension Maternal Grandmother    Dementia Maternal Grandmother    Colon cancer Maternal Uncle 45   Crohn's disease Maternal Aunt    Breast cancer Maternal Aunt 85   Diabetes Maternal Aunt    Breast cancer Cousin 6       maternal   Cancer Cousin        m cousin breat cancer s/p removal both breasts         09/15/2023   10:32 AM 09/02/2023    1:39 PM 08/09/2023    8:39 AM 05/26/2023    3:58 PM  GAD 7 : Generalized Anxiety Score  Nervous, Anxious, on Edge 2 1 2 1   Control/stop worrying 2 1 3 3   Worry too much - different things 2 1 3 3   Trouble relaxing 2 1 3 3   Restless 0 0 1 0  Easily annoyed or irritable 0 0 1 0  Afraid - awful might happen 2 1 2 2   Total GAD 7 Score 10 5 15 12   Anxiety Difficulty Somewhat difficult Somewhat difficult Extremely difficult Somewhat difficult       09/15/2023   10:31 AM 09/02/2023    1:39 PM 08/09/2023    8:38 AM  Depression screen PHQ 2/9  Decreased Interest 0 0 0  Down, Depressed, Hopeless 2 1 1   PHQ - 2 Score 2 1 1   Altered sleeping 2 2 1   Tired, decreased energy 2 2 0  Change in appetite 2 2 0  Feeling bad or failure about yourself  2 0 1  Trouble concentrating 0 0 0  Moving slowly or fidgety/restless 0 0 0  Suicidal thoughts 0 0 0  PHQ-9 Score 10 7 3   Difficult doing work/chores Somewhat difficult Somewhat difficult Somewhat difficult    BP Readings from Last 3 Encounters:  09/15/23 128/80   09/02/23 120/86  08/09/23 130/78    Wt Readings from Last 3 Encounters:  09/15/23 186 lb (84.4 kg)  09/02/23 191 lb (86.6 kg)  08/09/23 197 lb 3.2  oz (89.4 kg)    BP 128/80 (BP Location: Left Arm, Patient Position: Sitting, Cuff Size: Normal)   Pulse 96   Ht 5\' 9"  (1.753 m)   Wt 186 lb (84.4 kg)   LMP 12/15/2015 Comment: neg urine test   SpO2 95%   BMI 27.47 kg/m   Physical Exam Vitals and nursing note reviewed.  Constitutional:      Appearance: Normal appearance.  Cardiovascular:     Rate and Rhythm: Normal rate and regular rhythm.     Heart sounds: Heart sounds not distant. No murmur heard.    No friction rub. No gallop.     Comments: No edema or JVD Pulmonary:     Effort: Pulmonary effort is normal.     Breath sounds: Normal breath sounds.  Abdominal:     General: There is no distension.  Musculoskeletal:        General: Normal range of motion.  Skin:    General: Skin is warm and dry.  Neurological:     Mental Status: She is alert and oriented to person, place, and time.     Gait: Gait is intact.  Psychiatric:        Mood and Affect: Mood is anxious. Affect is tearful.      EKG: Normal sinus rhythm but with significant motion artifact due to Parkinson's.  Low voltage in precordial leads along with RSR in V1-V2 suggesting possible bundle branch block.  Normal axis, no obvious ST changes on this EKG.   Recent Labs     Component Value Date/Time   NA 141 09/02/2023 1443   NA 141 09/25/2014 0015   K 4.2 09/02/2023 1443   K 4.2 09/25/2014 0015   CL 107 (H) 09/02/2023 1443   CL 110 (H) 09/25/2014 0015   CO2 19 (L) 09/02/2023 1443   CO2 24 09/25/2014 0015   GLUCOSE 94 09/02/2023 1443   GLUCOSE 86 04/11/2023 1638   GLUCOSE 121 (H) 09/25/2014 0015   BUN 13 09/02/2023 1443   BUN 21 (H) 09/25/2014 0015   CREATININE 0.88 09/02/2023 1443   CREATININE 0.93 09/25/2014 0015   CALCIUM 9.5 09/02/2023 1443   CALCIUM 8.4 (L) 09/25/2014 0015   PROT 7.1 09/02/2023  1443   PROT 6.6 09/25/2014 0015   ALBUMIN 4.9 09/02/2023 1443   ALBUMIN 3.9 09/25/2014 0015   AST 20 09/02/2023 1443   AST 26 09/25/2014 0015   ALT 24 09/02/2023 1443   ALT 27 09/25/2014 0015   ALKPHOS 103 09/02/2023 1443   ALKPHOS 55 09/25/2014 0015   BILITOT <0.2 09/02/2023 1443   BILITOT 0.4 09/25/2014 0015   GFRNONAA >60 04/11/2023 1638   GFRNONAA >60 09/25/2014 0015   GFRNONAA >60 07/03/2014 0122   GFRAA >60 04/17/2020 0724   GFRAA >60 09/25/2014 0015   GFRAA >60 07/03/2014 0122    Lab Results  Component Value Date   WBC 5.6 04/11/2023   HGB 14.4 04/11/2023   HCT 44.0 04/11/2023   MCV 86.1 04/11/2023   PLT 165 04/11/2023   Lab Results  Component Value Date   HGBA1C 6.0 (A) 04/29/2022   Lab Results  Component Value Date   CHOL 166 08/26/2021   HDL 61.30 08/26/2021   LDLCALC 91 08/26/2021   TRIG 66.0 08/26/2021   CHOLHDL 3 08/26/2021   Lab Results  Component Value Date   TSH 1.730 09/02/2023     Assessment and Plan:  1. Lethargy Will repeat CBC, though normal 3 months ago.  At  risk for vitamin deficiencies given her recently decreased oral intake, so we will be checking vitamin D, B12, B9 today. - VITAMIN D 25 Hydroxy (Vit-D Deficiency, Fractures) - B12 and Folate Panel - CBC with Differential/Platelet  2. Dyspnea on exertion In addition to the labs above, plan for echocardiogram.  No signs of heart failure on today's exam.  EKG with low voltage and a possible bundle branch block but otherwise fairly normal.  Follow-up with cardiology on this. - EKG 12-Lead - ECHOCARDIOGRAM COMPLETE  3. Osteopenia, unspecified locatio - VITAMIN D 25 Hydroxy (Vit-D Deficiency, Fractures)  4. Vitamin D deficiency - VITAMIN D 25 Hydroxy (Vit-D Deficiency, Fractures)  5. Encounter for vitamin deficiency screening - VITAMIN D 25 Hydroxy (Vit-D Deficiency, Fractures) - B12 and Folate Panel   Patient encouraged to find her lorazepam or request refill from psychiatry  ASAP.     Return if symptoms worsen or fail to improve.    Alvester Morin, PA-C, DMSc, Nutritionist [facility] [phone]

## 2023-09-16 ENCOUNTER — Ambulatory Visit: Payer: Medicare Other | Admitting: Family

## 2023-09-16 LAB — B12 AND FOLATE PANEL
Folate: 8.7 ng/mL (ref >3.0)
Vitamin B-12: 542 pg/mL (ref 232–1245)

## 2023-09-16 LAB — CBC WITH DIFFERENTIAL/PLATELET
Basophils Absolute: 0 10*3/uL (ref 0.0–0.2)
Basos: 0 %
EOS (ABSOLUTE): 0.1 10*3/uL (ref 0.0–0.4)
Eos: 1 %
Hematocrit: 46.7 % — ABNORMAL HIGH (ref 34.0–46.6)
Hemoglobin: 15.3 g/dL (ref 11.1–15.9)
Immature Grans (Abs): 0 10*3/uL (ref 0.0–0.1)
Immature Granulocytes: 0 %
Lymphocytes Absolute: 1.5 10*3/uL (ref 0.7–3.1)
Lymphs: 30 %
MCH: 28.5 pg (ref 26.6–33.0)
MCHC: 32.8 g/dL (ref 31.5–35.7)
MCV: 87 fL (ref 79–97)
Monocytes Absolute: 0.3 10*3/uL (ref 0.1–0.9)
Monocytes: 6 %
Neutrophils Absolute: 3.2 10*3/uL (ref 1.4–7.0)
Neutrophils: 63 %
Platelets: 155 10*3/uL (ref 150–450)
RBC: 5.36 x10E6/uL — ABNORMAL HIGH (ref 3.77–5.28)
RDW: 14.4 % (ref 11.7–15.4)
WBC: 5.1 10*3/uL (ref 3.4–10.8)

## 2023-09-16 LAB — VITAMIN D 25 HYDROXY (VIT D DEFICIENCY, FRACTURES): Vit D, 25-Hydroxy: 32 ng/mL (ref 30.0–100.0)

## 2023-09-16 NOTE — Telephone Encounter (Signed)
FYI  KP

## 2023-09-22 ENCOUNTER — Other Ambulatory Visit: Payer: Self-pay | Admitting: Physician Assistant

## 2023-09-22 ENCOUNTER — Emergency Department
Admission: EM | Admit: 2023-09-22 | Discharge: 2023-09-22 | Disposition: A | Discharge status: Home / Self Care | Payer: Medicare Other | Attending: Emergency Medicine | Admitting: Emergency Medicine

## 2023-09-22 ENCOUNTER — Emergency Department: Payer: Medicare Other

## 2023-09-22 ENCOUNTER — Other Ambulatory Visit: Payer: Self-pay

## 2023-09-22 DIAGNOSIS — R0789 Other chest pain: Secondary | ICD-10-CM | POA: Diagnosis not present

## 2023-09-22 DIAGNOSIS — R0602 Shortness of breath: Secondary | ICD-10-CM | POA: Diagnosis not present

## 2023-09-22 DIAGNOSIS — E876 Hypokalemia: Secondary | ICD-10-CM | POA: Diagnosis not present

## 2023-09-22 DIAGNOSIS — M329 Systemic lupus erythematosus, unspecified: Secondary | ICD-10-CM | POA: Diagnosis not present

## 2023-09-22 DIAGNOSIS — G20C Parkinsonism, unspecified: Secondary | ICD-10-CM | POA: Diagnosis not present

## 2023-09-22 DIAGNOSIS — R079 Chest pain, unspecified: Secondary | ICD-10-CM | POA: Insufficient documentation

## 2023-09-22 DIAGNOSIS — Z1152 Encounter for screening for COVID-19: Secondary | ICD-10-CM | POA: Insufficient documentation

## 2023-09-22 DIAGNOSIS — R718 Other abnormality of red blood cells: Secondary | ICD-10-CM | POA: Insufficient documentation

## 2023-09-22 DIAGNOSIS — E878 Other disorders of electrolyte and fluid balance, not elsewhere classified: Secondary | ICD-10-CM | POA: Insufficient documentation

## 2023-09-22 DIAGNOSIS — R0609 Other forms of dyspnea: Secondary | ICD-10-CM

## 2023-09-22 LAB — SARS CORONAVIRUS 2 BY RT PCR: SARS Coronavirus 2 by RT PCR: NEGATIVE

## 2023-09-22 LAB — CBC
HCT: 42.8 % (ref 36.0–46.0)
Hemoglobin: 14.9 g/dL (ref 12.0–15.0)
MCH: 28.7 pg (ref 26.0–34.0)
MCHC: 34.8 g/dL (ref 30.0–36.0)
MCV: 82.5 fL (ref 80.0–100.0)
Platelets: 151 10*3/uL (ref 150–400)
RBC: 5.19 MIL/uL — ABNORMAL HIGH (ref 3.87–5.11)
RDW: 13.4 % (ref 11.5–15.5)
WBC: 4.2 10*3/uL (ref 4.0–10.5)
nRBC: 0 % (ref 0.0–0.2)

## 2023-09-22 LAB — COMPREHENSIVE METABOLIC PANEL
ALT: 23 U/L (ref 0–44)
AST: 22 U/L (ref 15–41)
Albumin: 4.6 g/dL (ref 3.5–5.0)
Alkaline Phosphatase: 81 U/L (ref 38–126)
Anion gap: 10 (ref 5–15)
BUN: 12 mg/dL (ref 6–20)
CO2: 18 mmol/L — ABNORMAL LOW (ref 22–32)
Calcium: 8.9 mg/dL (ref 8.9–10.3)
Chloride: 110 mmol/L (ref 98–111)
Creatinine, Ser: 0.99 mg/dL (ref 0.44–1.00)
GFR, Estimated: >60 mL/min (ref >60)
Glucose, Bld: 108 mg/dL — ABNORMAL HIGH (ref 70–99)
Potassium: 3.4 mmol/L — ABNORMAL LOW (ref 3.5–5.1)
Sodium: 138 mmol/L (ref 135–145)
Total Bilirubin: 0.7 mg/dL (ref 0.3–1.2)
Total Protein: 7.1 g/dL (ref 6.5–8.1)

## 2023-09-22 LAB — LIPASE, BLOOD: Lipase: 28 U/L (ref 11–51)

## 2023-09-22 LAB — D-DIMER, QUANTITATIVE: D-Dimer, Quant: <0.27 ug{FEU}/mL (ref 0.00–0.50)

## 2023-09-22 LAB — TROPONIN I (HIGH SENSITIVITY)
Troponin I (High Sensitivity): 3 ng/L (ref <18)
Troponin I (High Sensitivity): 3 ng/L (ref <18)

## 2023-09-22 LAB — MAGNESIUM: Magnesium: 2 mg/dL (ref 1.7–2.4)

## 2023-09-22 LAB — BRAIN NATRIURETIC PEPTIDE: B Natriuretic Peptide: 11.9 pg/mL (ref 0.0–100.0)

## 2023-09-22 LAB — CK: Total CK: 66 U/L (ref 38–234)

## 2023-09-22 MED ORDER — POTASSIUM CHLORIDE CRYS ER 20 MEQ PO TBCR
40.0000 meq | EXTENDED_RELEASE_TABLET | Freq: Once | ORAL | Status: AC
  Administered 2023-09-22: 40 meq via ORAL
  Filled 2023-09-22: qty 2

## 2023-09-22 MED ORDER — ACETAMINOPHEN 500 MG PO TABS
1000.0000 mg | ORAL_TABLET | Freq: Once | ORAL | Status: AC
  Administered 2023-09-22: 1000 mg via ORAL
  Filled 2023-09-22: qty 2

## 2023-09-22 MED ORDER — LIDOCAINE 5 % EX PTCH
1.0000 | MEDICATED_PATCH | CUTANEOUS | Status: DC
  Administered 2023-09-22: 1 via TRANSDERMAL
  Filled 2023-09-22: qty 1

## 2023-09-22 MED ORDER — SODIUM CHLORIDE 0.9 % IV BOLUS
1000.0000 mL | Freq: Once | INTRAVENOUS | Status: AC
  Administered 2023-09-22: 1000 mL via INTRAVENOUS

## 2023-09-22 NOTE — ED Triage Notes (Signed)
Pt presents to ED with c/o of SOB for the past 2 weeks. Pt states HX of asthma, NAD noted. Pt states abnormal EKGs changes "and a enlarged heart". Pt c/o of CP.

## 2023-09-22 NOTE — ED Provider Notes (Signed)
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  Patient reports symptoms have been going on since August but have been worsening.  She reports increasing shortness of breath with at rest and with ambulation.  She states that she had asthma previously when she was much younger but is not currently needed any inhalers.  She denies any risk factors for PE.  Denies any history of cardiac surgery.  She reports that lately she has been having the symptoms of feeling like she may pass out but never has actually lost consciousness.  She reports that today the chest pain began around 5 AM is a sharp stabbing on the left side of her chest.  Associated with some shortness of breath.  On review of records patient had cardiac workup between 2018 and 2020 that was normal.  ON cardiology note from 07/05/2019 "With the patient's history of lupus and high risk for undergoing cardiac catheterization, invasive intervention has been deferred and medical management was pursued. The patient had a cardiac CT angiogram at [facility] on 07/28/2018, which revealed no evidence for atherosclerosis sclerotic disease with a CAD-RADS score of 0. The patient has chest tightness and shortness of breath with underlying asthma when climbing stairs. She experiences intermittent palpitations, particularly at night. 48-hour Holter monitor was essentially unremarkable, revealing rare PVCs and PACs"   There was some concern on a CT scan back in 2020 mild cardiomegaly but has had no imaging things then.   Physical Exam   Triage Vital Signs: ED Triage Vitals  Encounter Vitals Group     BP 09/22/23 0803 (!) 133/101     Systolic BP Percentile --      Diastolic BP Percentile  --      Pulse Rate 09/22/23 0803 98     Resp 09/22/23 0803 20     Temp 09/22/23 0803 98 F (36.7 C)     Temp Source 09/22/23 0803 Oral     SpO2 09/22/23 0803 100 %     Weight 09/22/23 0804 186 lb (84.4 kg)     Height 09/22/23 0804 5\' 10"  (1.778 m)     Head Circumference --      Peak Flow --      Pain Score 09/22/23 0803 9     Pain Loc --      Pain Education --      Exclude from Growth Chart --     Most recent vital signs: Vitals:   09/22/23 0803  BP: (!) 133/101  Pulse: 98  Resp: 20  Temp: 98 F (36.7 C)  SpO2: 100%     General: Awake, no distress.  CV:  Good peripheral perfusion.  Resp:  Normal effort.  Clear lungs Abd:  No distention.  Soft nontender Other:  No swelling in legs.  No calf tenderness Patient has slight tremor which she states is from her Parkinson's disease.  She appears slightly anxious.  ED Results / Procedures / Treatments   Labs (all labs ordered are listed, but only abnormal results are displayed) Labs Reviewed  SARS CORONAVIRUS 2 BY RT PCR  CBC  COMPREHENSIVE METABOLIC PANEL  LIPASE, BLOOD  D-DIMER, QUANTITATIVE  MAGNESIUM  TROPONIN I (  HIGH SENSITIVITY)     EKG  My interpretation of EKG:  Sinus rhythm 94 without any ST elevation or T wave inversions Q wave in lead III, normal intervals, EKG looks similar to then.  RADIOLOGY I have reviewed the xray personally and interpreted no evidence of any pneumonia   PROCEDURES:  Critical Care performed: No  .1-3 Lead EKG Interpretation  Performed by: Concha Se, MD Authorized by: Concha Se, MD     Interpretation: normal     ECG rate:  80   ECG rate assessment: normal     Rhythm: sinus rhythm     Ectopy: none     Conduction: normal      MEDICATIONS ORDERED IN ED: Medications  lidocaine (LIDODERM) 5 % 1 patch (1 patch Transdermal Patch Applied 09/22/23 0832)  acetaminophen (TYLENOL) tablet 1,000 mg (1,000 mg Oral Given 09/22/23 0831)     IMPRESSION / MDM /  ASSESSMENT AND PLAN / ED COURSE  I reviewed the triage vital signs and the nursing notes.   Patient's presentation is most consistent with acute presentation with potential threat to life or bodily function.   Patient comes in with some chest pain.  Differential is ACS, PE, valvular issue EKG reassuring.  Low Wells score will get D-dimer, cardiac markers treated with Tylenol, lidocaine patch given patient is driving.  CBC is reassuring.  Troponin was negative.  CMP shows slightly low bicarb I did get a VBG to ensure she is not acidotic and it was normal.  Her potassium was slightly low and given some repletion lipase normal D-dimer negative magnesium normal BNP normal CK normal  On repeat assessment patient is feeling better respiratory rate was charted in the 30s but her respiratory rate was normal when I assessed her.  I messaged Dr. Mariah Milling to see if they can get her sooner follow-up and he would see what they can do but she does have a guaranteed follow-up on November 4.  I discussed this with patient and we discussed return precautions if she develops worsening symptoms or any other concerns but at this time she feels comfortable with discharge home  Given patient is on some medications for anxiety I asked if she stopped her benzos abruptly because I discussed that could cause some withdrawal symptoms and she denies this.     The patient is on the cardiac monitor to evaluate for evidence of arrhythmia and/or significant heart rate changes.      FINAL CLINICAL IMPRESSION(S) / ED DIAGNOSES   Final diagnoses:  SOB (shortness of breath)  Chest pain, unspecified type     Rx / DC Orders   ED Discharge Orders     None        Note:  This document was prepared using Dragon voice recognition software and may include unintentional dictation errors.   Concha Se, MD 09/22/23 1128

## 2023-09-22 NOTE — Discharge Instructions (Signed)
Workup today was reassuring however you develop return of symptoms or worsening symptoms which return to the ER for repeat evaluations otherwise you can follow-up with cardiology and get your echocardiogram

## 2023-09-24 LAB — BLOOD GAS, VENOUS
Acid-base deficit: 2.8 mmol/L — ABNORMAL HIGH (ref 0.0–2.0)
Bicarbonate: 23.2 mmol/L (ref 20.0–28.0)
O2 Saturation: 41.5 %
Patient temperature: 37
pCO2, Ven: 44 mm[Hg] (ref 44–60)
pH, Ven: 7.33 (ref 7.25–7.43)
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
## 2023-10-04 ENCOUNTER — Ambulatory Visit: Payer: Medicare Other | Attending: Cardiovascular Disease | Admitting: Cardiovascular Disease

## 2023-10-04 ENCOUNTER — Encounter: Payer: Self-pay | Admitting: Cardiovascular Disease

## 2023-10-04 VITALS — BP 130/76 | HR 72 | Ht 70.0 in | Wt 181.4 lb

## 2023-10-04 DIAGNOSIS — G20A1 Parkinson's disease without dyskinesia, without mention of fluctuations: Secondary | ICD-10-CM

## 2023-10-04 DIAGNOSIS — F419 Anxiety disorder, unspecified: Secondary | ICD-10-CM

## 2023-10-04 DIAGNOSIS — R0602 Shortness of breath: Secondary | ICD-10-CM | POA: Diagnosis not present

## 2023-10-04 DIAGNOSIS — R079 Chest pain, unspecified: Secondary | ICD-10-CM | POA: Diagnosis not present

## 2023-10-04 DIAGNOSIS — F32A Depression, unspecified: Secondary | ICD-10-CM

## 2023-10-04 NOTE — Patient Instructions (Addendum)

## 2023-10-05 ENCOUNTER — Other Ambulatory Visit: Payer: Self-pay | Admitting: Family

## 2023-10-05 DIAGNOSIS — R110 Nausea: Secondary | ICD-10-CM

## 2023-10-05 DIAGNOSIS — G43909 Migraine, unspecified, not intractable, without status migrainosus: Secondary | ICD-10-CM

## 2023-10-05 NOTE — Telephone Encounter (Signed)
Please review.  KP

## 2023-10-06 ENCOUNTER — Other Ambulatory Visit: Payer: Self-pay | Admitting: Physician Assistant

## 2023-10-06 ENCOUNTER — Ambulatory Visit: Payer: Medicare Other | Attending: Physician Assistant

## 2023-10-06 DIAGNOSIS — R0609 Other forms of dyspnea: Secondary | ICD-10-CM | POA: Diagnosis not present

## 2023-10-08 ENCOUNTER — Other Ambulatory Visit: Payer: Self-pay | Admitting: Physician Assistant

## 2023-10-08 ENCOUNTER — Ambulatory Visit (INDEPENDENT_AMBULATORY_CARE_PROVIDER_SITE_OTHER): Payer: Medicare Other | Admitting: Physician Assistant

## 2023-10-08 ENCOUNTER — Encounter: Payer: Self-pay | Admitting: Physician Assistant

## 2023-10-08 ENCOUNTER — Ambulatory Visit: Payer: Self-pay | Admitting: Physician Assistant

## 2023-10-08 VITALS — BP 110/82 | HR 84 | Ht 70.0 in | Wt 178.0 lb

## 2023-10-08 DIAGNOSIS — J4540 Moderate persistent asthma, uncomplicated: Secondary | ICD-10-CM

## 2023-10-08 DIAGNOSIS — R350 Frequency of micturition: Secondary | ICD-10-CM | POA: Diagnosis not present

## 2023-10-08 LAB — ECHOCARDIOGRAM COMPLETE
AR max vel: 2.36 cm2
AV Area VTI: 2.41 cm2
AV Area mean vel: 2.33 cm2
AV Mean grad: 3 mm[Hg]
AV Peak grad: 4.9 mm[Hg]
Ao pk vel: 1.11 m/s
Area-P 1/2: 3.68 cm2
Calc EF: 50 %
S' Lateral: 3.2 cm
Single Plane A2C EF: 49 %
Single Plane A4C EF: 53.6 %

## 2023-10-08 LAB — POCT URINALYSIS DIPSTICK
Bilirubin, UA: NEGATIVE
Blood, UA: NEGATIVE
Glucose, UA: NEGATIVE
Ketones, UA: NEGATIVE
Nitrite, UA: NEGATIVE
Protein, UA: NEGATIVE
Spec Grav, UA: 1.015 (ref 1.010–1.025)
Urobilinogen, UA: 0.2 U/dL
pH, UA: 6 (ref 5.0–8.0)

## 2023-10-08 MED ORDER — NITROFURANTOIN MONOHYD MACRO 100 MG PO CAPS: 100.0000 mg | ORAL_CAPSULE | Freq: Two times a day (BID) | ORAL | 0 refills | Status: AC

## 2023-10-08 NOTE — Telephone Encounter (Signed)
Please review.  KP

## 2023-10-08 NOTE — Progress Notes (Signed)
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  Asthma Her past medical history is significant for asthma.   Adamarie presents today for evaluation of suspected UTI reporting 3 days urinary frequency, urgency, and bladder discomfort.  No dysuria, hematuria, flank pain, fever.  Also would like a new nebulizer for her asthma, which is flaring up.  Gave her old nebulizer to her mother who has moved to [place].   Medication list has been reviewed and updated.  Current Meds  Medication Sig   albuterol (PROVENTIL) (2.5 MG/3ML) 0.083% nebulizer solution Take 3 mLs (2.5 mg total) by nebulization every 6 (six) hours as needed for wheezing or shortness of breath.   albuterol (VENTOLIN HFA) 108 (90 Base) MCG/ACT inhaler INHALE 1 TO 2 PUFFS INTO THE LUNGS EVERY 4 HOURS AS NEEDED FOR WHEEZING OR SHORTNESS OF BREATH   carbidopa-levodopa (SINEMET CR) 50-200 MG tablet Take 1 tablet by mouth 5 (five) times daily.   colestipol (COLESTID) 1 g tablet Take 3 tablets (3 g total) by mouth 2 (two) times daily.   dexlansoprazole (DEXILANT) 60 MG capsule Take 1 capsule (60 mg total) by mouth daily.   diclofenac sodium (VOLTAREN) 1 % GEL Apply 2 g topically 4 (four) times daily as needed (for knee & finger pain.).    dicyclomine (BENTYL) 20 MG tablet Take 1 tablet (20 mg total) by mouth every 6 (six) hours. (Patient taking differently: Take 20 mg by mouth every 6 (six) hours. Pt is taking 3 times a daily)   DULoxetine (CYMBALTA) 60 MG capsule Take 1 capsule (60 mg total) by mouth 2 (two) times daily.   EPINEPHrine 0.3 mg/0.3 mL IJ SOAJ injection Inject 0.3 mg into the muscle as needed for anaphylaxis.   HYDROcodone-acetaminophen (NORCO/VICODIN) 5-325 MG tablet Take 2x"s daily   hydroxychloroquine  (PLAQUENIL) 200 MG tablet Plaquenil 200 mg tablet  Take 1 tablet twice a day by oral route after meals for 90 days.   lactase (LACTAID) 3000 units tablet Take 3,000 Units by mouth 3 (three) times daily with meals.   latanoprost (XALATAN) 0.005 % ophthalmic solution 1 drop at bedtime.   levothyroxine (SYNTHROID) 75 MCG tablet TAKE 1 TABLET BY MOUTH DAILY 30 MINUTES BEFORE BREAKFAST   methocarbamol (ROBAXIN) 750 MG tablet Take 1 tablet (750 mg total) by mouth every 6 (six) hours as needed.   montelukast (SINGULAIR) 10 MG tablet TAKE 1 TABLET BY MOUTH EVERY NIGHT AT BEDTIME   nitrofurantoin, macrocrystal-monohydrate, (MACROBID) 100 MG capsule Take 1 capsule (100 mg total) by mouth 2 (two) times daily for 5 days.   ondansetron (ZOFRAN-ODT) 8 MG disintegrating tablet TAKE 1 TABLET BY MOUTH EVERY 8 HOURS AS NEEDED FOR NAUSEA OR VOMTING   PAXIL 20 MG tablet    Polyvinyl Alcohol-Povidone (REFRESH OP) Place 1 drop into both eyes 2 (two) times daily.   topiramate (TOPAMAX) 50 MG tablet    triamcinolone ointment (KENALOG) 0.5 % Apply 1 Application topically 2 (two) times daily.   Zoledronic Acid (RECLAST IV)      Review of Systems  Genitourinary:  Positive for frequency.    Patient Active Problem List   Diagnosis Date Noted   Polypharmacy 09/02/2023   Severe diarrhea 09/22/2022   Chronic low back pain 07/23/2022  Lumbar facet arthropathy 07/23/2022   Cyst of right kidney 07/23/2022   Prediabetes 11/06/2021   Deformity of both feet 06/05/2020   CTS (carpal tunnel syndrome) 11/17/2019   Vitamin D deficiency 07/04/2019   Chronic venous insufficiency 04/18/2019   Parkinson disease (HCC) 04/03/2019   Connective tissue disorder (HCC) 02/14/2019   Hiatal hernia 10/06/2018   ADHD 08/04/2018   Lupus arthritis (HCC) 08/04/2018   Asthma 08/04/2018   Systemic lupus erythematosus (HCC) 08/04/2018   Urinary incontinence 08/04/2018   Glaucoma 08/04/2018   Migraines 08/04/2018   Mitral valve  regurgitation 08/04/2018   Neuropathy 08/04/2018   OCD (obsessive compulsive disorder) 08/04/2018   Osteopenia 08/04/2018   Thrombocytopenia (HCC) 07/06/2018   Allergic rhinitis 12/16/2017   Chronic constipation 12/16/2017   Fibromyalgia 11/04/2017   Sicca syndrome (HCC) 06/04/2014   Chronic pain 03/31/2011   Overactive bladder 10/14/2009   Optic neuritis 11/08/2007   Polyarthropathy or polyarthritis of multiple sites 08/19/2007   Hypothyroidism 06/06/2007   Anxiety and depression 06/06/2007   Depression, recurrent (HCC) 06/06/2007   GERD 06/06/2007    Allergies  Allergen Reactions   Bee Venom Anaphylaxis, Itching and Swelling    Affected area Affected area Affected area Affected area    Fish Allergy Hives, Swelling and Rash    Facial swelling  Facial swelling  Facial swelling Facial swelling  Facial swelling    Latex Anaphylaxis, Rash and Shortness Of Breath    Rash  Rash     Peanut-Containing Drug Products Anaphylaxis   Prasterone Other (See Comments) and Nausea And Vomiting    rash Other reaction(s): Headache Headaches.   Shellfish Allergy Hives, Other (See Comments), Rash and Swelling    Facial swelling Uncoded Allergy. Allergen: seafood Uncoded Allergy. Allergen: CATS, Other Reaction: itch, wheezing Uncoded Allergy. Allergen: COMPAZINE, Other Reaction: tremors Facial swelling Facial swelling Uncoded Allergy. Allergen: seafood Uncoded Allergy. Allergen: CATS, Other Reaction: itch, wheezing Uncoded Allergy. Allergen: COMPAZINE, Other Reaction: tremors Facial swelling Uncoded Allergy. Allergen: seafood Uncoded Allergy. Allergen: CATS, Other Reaction: itch, wheezing Uncoded Allergy. Allergen: COMPAZINE, Other Reaction: tremors Facial swelling Facial swelling Uncoded Allergy. Allergen: seafood Uncoded Allergy. Allergen: CATS, Other Reaction: itch, wheezing Uncoded Allergy. Allergen: COMPAZINE, Other Reaction: tremors Facial swelling Facial  swelling Uncoded Allergy. Allergen: seafood Uncoded Allergy. Allergen: CATS, Other Reaction: itch, wheezing Uncoded Allergy. Allergen: COMPAZINE, Other Reaction: tremors    Shellfish-Derived Products Hives, Other (See Comments), Rash and Swelling    Facial swelling Uncoded Allergy. Allergen: seafood Uncoded Allergy. Allergen: CATS, Other Reaction: itch, wheezing Uncoded Allergy. Allergen: COMPAZINE, Other Reaction: tremors Facial swelling Facial swelling Uncoded Allergy. Allergen: seafood Uncoded Allergy. Allergen: CATS, Other Reaction: itch, wheezing Uncoded Allergy. Allergen: COMPAZINE, Other Reaction: tremors Facial swelling Uncoded Allergy. Allergen: seafood Uncoded Allergy. Allergen: CATS, Other Reaction: itch, wheezing Uncoded Allergy. Allergen: COMPAZINE, Other Reaction: tremors Facial swelling   Compazine [Prochlorperazine Edisylate] Other (See Comments)    tremors   Dhea [Nutritional Supplements] Other (See Comments)    Headaches.   Dilaudid [Hydromorphone Hcl]     ? reaction   Famotidine Other (See Comments) and Diarrhea    Other reaction(s): Headache Gas   Gabapentin Other (See Comments)    'snowed out'    Lactose    Lactose Intolerance (Gi)    Other Other (See Comments)    Other reaction(s): Unknown    Prochlorperazine     Other reaction(s): Other (See Comments) ticks   Promethazine     Other reaction(s): Other (See Comments) Ticks   Promethazine Hcl  Other (See Comments)    CNS disorder   Reclast [Zoledronic Acid]     Weakness could not move limbs, fatigue, increase sleep   Clarithromycin Hives and Rash   Clotrimazole Swelling and Rash   Hydromorphone Hcl Rash and Hives    "Rash all over"   Penicillins Rash and Other (See Comments)    Has patient had a PCN reaction causing immediate rash, facial/tongue/throat swelling, SOB or lightheadedness with hypotension:No Has patient had a PCN reaction causing severe rash involving mucus membranes or skin  necrosis:No Has patient had a PCN reaction that required hospitalization:No Has patient had a PCN reaction occurring within the last 10 years:No If all of the above answers are "NO", then may proceed with Cephalosporin use.    Immunization History  Administered Date(s) Administered   Influenza Inj Mdck Quad Pf 08/30/2017   Influenza Split 08/13/2011, 08/19/2012, 09/18/2013   Influenza Whole 09/13/2007, 09/12/2008, 08/06/2010   Influenza, Seasonal, Injecte, Preservative Fre 08/09/2023   Influenza,inj,Quad PF,6+ Mos 08/13/2011, 08/19/2012, 09/18/2013, 08/21/2014, 09/18/2014, 09/02/2015, 09/01/2016, 08/30/2017, 08/19/2018, 07/28/2019, 08/06/2020, 08/21/2021, 07/23/2022   Influenza-Unspecified 09/02/2015, 08/30/2017   PFIZER(Purple Top)SARS-COV-2 Vaccination 03/19/2020, 04/09/2020, 11/06/2020   Pfizer Covid-19 Vaccine Bivalent Booster 39yrs & up 04/21/2021, 10/21/2021   Pneumococcal Conjugate-13 11/30/2004, 06/04/2014, 09/03/2014   Pneumococcal Polysaccharide-23 11/30/2004, 09/05/2012, 01/21/2022   Td 11/30/2004   Tdap 09/11/2014, 11/05/2014, 10/19/2016    Past Surgical History:  Procedure Laterality Date   ADENOIDECTOMY     APPENDECTOMY     CERVICAL BIOPSY  W/ LOOP ELECTRODE EXCISION  2005   CHOLECYSTECTOMY     COLONOSCOPY N/A 10/16/2022   Procedure: COLONOSCOPY;  Surgeon: Regis Bill, MD;  Location: [facility] ENDOSCOPY;  Service: Gastroenterology;  Laterality: N/A;   ESOPHAGOGASTRODUODENOSCOPY N/A 10/16/2022   Procedure: ESOPHAGOGASTRODUODENOSCOPY (EGD);  Surgeon: Regis Bill, MD;  Location: [facility] ENDOSCOPY;  Service: Gastroenterology;  Laterality: N/A;   ESOPHAGOGASTRODUODENOSCOPY (EGD) WITH PROPOFOL N/A 08/27/2017   Procedure: ESOPHAGOGASTRODUODENOSCOPY (EGD) WITH PROPOFOL;  Surgeon: Scot Jun, MD;  Location: [facility] ENDOSCOPY;  Service: Endoscopy;  Laterality: N/A;   FRACTURE SURGERY  11/30/2008   HAND SURGERY     repair of left metatarsal    HARDWARE REMOVAL  Right 02/16/2017   Procedure: HARDWARE REMOVAL FROM HIP;  Surgeon: Kennedy Bucker, MD;  Location: [facility] ORS;  Service: Orthopedics;  Laterality: Right;   JOINT REPLACEMENT     OTHER SURGICAL HISTORY     left 3rd metatarsal fracture repair 2011    REVISION TOTAL HIP ARTHROPLASTY  06/2009   replacement 2010 then screw and plate removal 1610; right hip   TONSILLECTOMY     TONSILLECTOMY     wrist  ligament repair bilateral     WRIST RECONSTRUCTION     WRIST SURGERY     laceration of right wrist ligaments   wrist surgery     laceration of left wrist surgery     Social History   Tobacco Use   Smoking status: Never   Smokeless tobacco: Never  Vaping Use   Vaping status: Never Used  Substance Use Topics   Alcohol use: No   Drug use: No    Family History  Problem Relation Age of Onset   Hypertension Mother    Arthritis Mother    Heart disease Mother        afib   Asthma Mother    Asthma Sister    Asthma Daughter    Arthritis Daughter    Heart disease Daughter        ?  heart condition on BB   ADD / ADHD Daughter    Depression Daughter    Intellectual disability Daughter    Learning disabilities Daughter    Pancreatic cancer Father    Cancer Father        pancreatitic    Diabetes Maternal Grandmother    Heart disease Maternal Grandmother    Arthritis Maternal Grandmother    Hypertension Maternal Grandmother    Dementia Maternal Grandmother    Colon cancer Maternal Uncle 45   Crohn's disease Maternal Aunt    Breast cancer Maternal Aunt 40   Diabetes Maternal Aunt    Breast cancer Cousin 65       maternal   Cancer Cousin        m cousin breat cancer s/p removal both breasts         09/15/2023   10:32 AM 09/02/2023    1:39 PM 08/09/2023    8:39 AM 05/26/2023    3:58 PM  GAD 7 : Generalized Anxiety Score  Nervous, Anxious, on Edge 2 1 2 1   Control/stop worrying 2 1 3 3   Worry too much - different things 2 1 3 3   Trouble relaxing 2 1 3 3   Restless 0 0 1 0  Easily  annoyed or irritable 0 0 1 0  Afraid - awful might happen 2 1 2 2   Total GAD 7 Score 10 5 15 12   Anxiety Difficulty Somewhat difficult Somewhat difficult Extremely difficult Somewhat difficult       09/15/2023   10:31 AM 09/02/2023    1:39 PM 08/09/2023    8:38 AM  Depression screen PHQ 2/9  Decreased Interest 0 0 0  Down, Depressed, Hopeless 2 1 1   PHQ - 2 Score 2 1 1   Altered sleeping 2 2 1   Tired, decreased energy 2 2 0  Change in appetite 2 2 0  Feeling bad or failure about yourself  2 0 1  Trouble concentrating 0 0 0  Moving slowly or fidgety/restless 0 0 0  Suicidal thoughts 0 0 0  PHQ-9 Score 10 7 3   Difficult doing work/chores Somewhat difficult Somewhat difficult Somewhat difficult    BP Readings from Last 3 Encounters:  10/08/23 110/82  10/04/23 130/76  09/22/23 117/68    Wt Readings from Last 3 Encounters:  10/08/23 178 lb (80.7 kg)  10/04/23 181 lb 6 oz (82.3 kg)  09/22/23 186 lb (84.4 kg)    BP 110/82   Pulse 84   Ht 5\' 10"  (1.778 m)   Wt 178 lb (80.7 kg)   LMP 12/15/2015 Comment: neg urine test   SpO2 97%   BMI 25.54 kg/m   Physical Exam Vitals and nursing note reviewed.  Constitutional:      Appearance: Normal appearance.  Cardiovascular:     Rate and Rhythm: Normal rate and regular rhythm.     Heart sounds: No murmur heard.    No friction rub. No gallop.  Pulmonary:     Effort: Pulmonary effort is normal.     Breath sounds: Normal breath sounds.     Comments: Cough present Abdominal:     General: There is no distension.  Musculoskeletal:        General: Normal range of motion.  Skin:    General: Skin is warm and dry.  Neurological:     Mental Status: She is alert and oriented to person, place, and time.     Gait: Gait is intact.  Psychiatric:  Mood and Affect: Mood and affect normal.      Recent Labs     Component Value Date/Time   NA 138 09/22/2023 0833   NA 141 09/02/2023 1443   NA 141 09/25/2014 0015   K 3.4 (L)  09/22/2023 0833   K 4.2 09/25/2014 0015   CL 110 09/22/2023 0833   CL 110 (H) 09/25/2014 0015   CO2 18 (L) 09/22/2023 0833   CO2 24 09/25/2014 0015   GLUCOSE 108 (H) 09/22/2023 0833   GLUCOSE 121 (H) 09/25/2014 0015   BUN 12 09/22/2023 0833   BUN 13 09/02/2023 1443   BUN 21 (H) 09/25/2014 0015   CREATININE 0.99 09/22/2023 0833   CREATININE 0.93 09/25/2014 0015   CALCIUM 8.9 09/22/2023 0833   CALCIUM 8.4 (L) 09/25/2014 0015   PROT 7.1 09/22/2023 0833   PROT 7.1 09/02/2023 1443   PROT 6.6 09/25/2014 0015   ALBUMIN 4.6 09/22/2023 0833   ALBUMIN 4.9 09/02/2023 1443   ALBUMIN 3.9 09/25/2014 0015   AST 22 09/22/2023 0833   AST 26 09/25/2014 0015   ALT 23 09/22/2023 0833   ALT 27 09/25/2014 0015   ALKPHOS 81 09/22/2023 0833   ALKPHOS 55 09/25/2014 0015   BILITOT 0.7 09/22/2023 0833   BILITOT <0.2 09/02/2023 1443   BILITOT 0.4 09/25/2014 0015   GFRNONAA >60 09/22/2023 0833   GFRNONAA >60 09/25/2014 0015   GFRNONAA >60 07/03/2014 0122   GFRAA >60 04/17/2020 0724   GFRAA >60 09/25/2014 0015   GFRAA >60 07/03/2014 0122    Lab Results  Component Value Date   WBC 4.2 09/22/2023   HGB 14.9 09/22/2023   HCT 42.8 09/22/2023   MCV 82.5 09/22/2023   PLT 151 09/22/2023   Lab Results  Component Value Date   HGBA1C 6.0 (A) 04/29/2022   Lab Results  Component Value Date   CHOL 166 08/26/2021   HDL 61.30 08/26/2021   LDLCALC 91 08/26/2021   TRIG 66.0 08/26/2021   CHOLHDL 3 08/26/2021   Lab Results  Component Value Date   TSH 1.730 09/02/2023     Assessment and Plan:  1. Moderate persistent asthma, unspecified whether complicated Seems like a mild exacerbation.  O2 97% today on room air.  Patient given a new nebulizer kit today. Has neb solution at home.  She is to let me know if no improvement by Monday, might consider prednisone.  2. Urinary frequency Treat with nitrofurantoin, culture pending. - POCT urinalysis dipstick - Urine Culture - nitrofurantoin,  macrocrystal-monohydrate, (MACROBID) 100 MG capsule; Take 1 capsule (100 mg total) by mouth 2 (two) times daily for 5 days.  Dispense: 10 capsule; Refill: 0    No follow-ups on file.    Alvester Morin, PA-C, DMSc, Nutritionist [facility] [phone]

## 2023-10-12 ENCOUNTER — Ambulatory Visit: Payer: Medicare Other | Admitting: Physician Assistant

## 2023-10-12 LAB — URINE CULTURE

## 2023-10-12 NOTE — Telephone Encounter (Signed)
FYI  KP

## 2023-10-15 ENCOUNTER — Ambulatory Visit: Payer: Medicare Other | Admitting: Physician Assistant

## 2023-10-15 NOTE — Telephone Encounter (Signed)
Please review.  KP

## 2023-10-17 ENCOUNTER — Encounter: Payer: Self-pay | Admitting: Cardiovascular Disease

## 2023-10-18 ENCOUNTER — Encounter: Payer: Self-pay | Admitting: Physician Assistant

## 2023-10-18 ENCOUNTER — Ambulatory Visit (INDEPENDENT_AMBULATORY_CARE_PROVIDER_SITE_OTHER): Payer: Medicare Other | Admitting: Physician Assistant

## 2023-10-18 VITALS — BP 142/90 | HR 94 | Temp 98.4°F | Ht 70.0 in | Wt 176.0 lb

## 2023-10-18 DIAGNOSIS — R55 Syncope and collapse: Secondary | ICD-10-CM

## 2023-10-18 DIAGNOSIS — R32 Unspecified urinary incontinence: Secondary | ICD-10-CM | POA: Diagnosis not present

## 2023-10-18 DIAGNOSIS — R197 Diarrhea, unspecified: Secondary | ICD-10-CM

## 2023-10-18 DIAGNOSIS — R350 Frequency of micturition: Secondary | ICD-10-CM

## 2023-10-18 DIAGNOSIS — K909 Intestinal malabsorption, unspecified: Secondary | ICD-10-CM

## 2023-10-18 DIAGNOSIS — Z79899 Other long term (current) drug therapy: Secondary | ICD-10-CM | POA: Diagnosis not present

## 2023-10-18 LAB — POCT URINALYSIS DIPSTICK
Bilirubin, UA: NEGATIVE
Blood, UA: NEGATIVE
Glucose, UA: NEGATIVE
Ketones, UA: NEGATIVE
Leukocytes, UA: NEGATIVE
Nitrite, UA: NEGATIVE
Protein, UA: NEGATIVE
Spec Grav, UA: 1.02 (ref 1.010–1.025)
Urobilinogen, UA: 0.2 U/dL
pH, UA: 5 (ref 5.0–8.0)

## 2023-10-18 MED ORDER — COLESTIPOL HCL 1 G PO TABS: 3.0000 g | ORAL_TABLET | Freq: Two times a day (BID) | ORAL | 1 refills | Status: AC

## 2023-10-18 NOTE — Telephone Encounter (Signed)
FYI  KP

## 2023-10-18 NOTE — Progress Notes (Signed)
Date:  10/18/2023   Name:  [name] Putt, MD   DOB:  1965-12-25   MRN:  [id number]   Chief Complaint: Urinary Tract Infection (Urinary freq and urgency) and Dizziness (Almost passed out yesterday )  Urinary Tract Infection  This is a recurrent problem. The current episode started in the past 7 days. The problem occurs every urination. The problem has been unchanged. The pain is mild. There has been no fever. There is No history of pyelonephritis. Associated symptoms include frequency and urgency. Pertinent negatives include no flank pain or hematuria. She has tried antibiotics for the symptoms. The treatment provided no relief.  Dizziness   Clancy presents for f/u on recent UTI evaluated 10/08/23 and treated with nitrofurantoin; culture revealed E. coli susceptible to chosen antibiotic, and she finished the course 10/13/2023.  Continues to complain of urinary frequency and urgency and so she was urged to make appointment today.  Had large volume urinary incontinence 4-5 times since last visit 10/08/23.  Previously saw urology in the distant past for overactive bladder, but has not had any troubles with this until recently.  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  Also having some dizziness, but this is pretty common for her baseline.  Endorses near syncope while shopping yesterday, was standing when she got very dizzy and lightheaded all of a sudden.  Wonders if she might have vagus nerve dysfunction.  Sees neurology in January.  Sees GI Monday for a number of GI complaints, namely GERD, abdominal pain, and trapped gas.  Medication list has been reviewed and updated.  Current Meds  Medication Sig   albuterol (PROVENTIL) (2.5 MG/3ML) 0.083% nebulizer solution Take 3 mLs (2.5 mg total) by nebulization every 6 (six) hours as needed for  wheezing or shortness of breath.   albuterol (VENTOLIN HFA) 108 (90 Base) MCG/ACT inhaler INHALE 1 TO 2 PUFFS INTO THE LUNGS EVERY 4 HOURS AS NEEDED FOR WHEEZING OR SHORTNESS OF BREATH   carbidopa-levodopa (SINEMET CR) 50-200 MG tablet Take 1 tablet by mouth 5 (five) times daily.   dexlansoprazole (DEXILANT) 60 MG capsule Take 1 capsule (60 mg total) by mouth daily.   diclofenac sodium (VOLTAREN) 1 % GEL Apply 2 g topically 4 (four) times daily as needed (for knee & finger pain.).    dicyclomine (BENTYL) 20 MG tablet Take 1 tablet (20 mg total) by mouth every 6 (six) hours. (Patient taking differently: Take 20 mg by mouth every 6 (six) hours. Pt is taking 3 times a daily)   DULoxetine (CYMBALTA) 60 MG capsule Take 1 capsule (60 mg total) by mouth 2 (two) times daily.   EPINEPHrine 0.3 mg/0.3 mL IJ SOAJ injection Inject 0.3 mg into the muscle as needed for anaphylaxis.   HYDROcodone-acetaminophen (NORCO/VICODIN) 5-325 MG tablet Take 2x"s daily   hydroxychloroquine (PLAQUENIL) 200 MG tablet Plaquenil 200 mg tablet  Take 1 tablet twice a day by oral route after meals for 90 days.   lactase (LACTAID) 3000 units tablet Take 3,000 Units by mouth 3 (three) times daily with meals.   latanoprost (XALATAN) 0.005 % ophthalmic solution 1 drop at bedtime.   levothyroxine (SYNTHROID) 75 MCG tablet TAKE 1 TABLET BY MOUTH DAILY 30 MINUTES BEFORE BREAKFAST   methocarbamol (ROBAXIN) 750 MG tablet Take 1 tablet (750 mg total) by mouth every 6 (  six) hours as needed.   montelukast (SINGULAIR) 10 MG tablet TAKE 1 TABLET BY MOUTH EVERY NIGHT AT BEDTIME   ondansetron (ZOFRAN-ODT) 8 MG disintegrating tablet TAKE 1 TABLET BY MOUTH EVERY 8 HOURS AS NEEDED FOR NAUSEA OR VOMTING   PAXIL 20 MG tablet    Polyvinyl Alcohol-Povidone (REFRESH OP) Place 1 drop into both eyes 2 (two) times daily.   topiramate (TOPAMAX) 50 MG tablet    triamcinolone ointment (KENALOG) 0.5 % Apply 1 Application topically 2 (two) times daily.    Zoledronic Acid (RECLAST IV)    [DISCONTINUED] colestipol (COLESTID) 1 g tablet Take 3 tablets (3 g total) by mouth 2 (two) times daily.     Review of Systems  Genitourinary:  Positive for frequency and urgency. Negative for dysuria, flank pain and hematuria.  Neurological:  Positive for dizziness.    Patient Active Problem List   Diagnosis Date Noted   Polypharmacy 09/02/2023   Severe diarrhea 09/22/2022   Chronic low back pain 07/23/2022   Lumbar facet arthropathy 07/23/2022   Cyst of right kidney 07/23/2022   Prediabetes 11/06/2021   Deformity of both feet 06/05/2020   CTS (carpal tunnel syndrome) 11/17/2019   Vitamin D deficiency 07/04/2019   Chronic venous insufficiency 04/18/2019   Parkinson disease (HCC) 04/03/2019   Connective tissue disorder (HCC) 02/14/2019   Hiatal hernia 10/06/2018   ADHD 08/04/2018   Lupus arthritis (HCC) 08/04/2018   Asthma 08/04/2018   Systemic lupus erythematosus (HCC) 08/04/2018   Urinary incontinence 08/04/2018   Glaucoma 08/04/2018   Migraines 08/04/2018   Mitral valve regurgitation 08/04/2018   Neuropathy 08/04/2018   OCD (obsessive compulsive disorder) 08/04/2018   Osteopenia 08/04/2018   Thrombocytopenia (HCC) 07/06/2018   Allergic rhinitis 12/16/2017   Chronic constipation 12/16/2017   Fibromyalgia 11/04/2017   Sicca syndrome (HCC) 06/04/2014   Chronic pain 03/31/2011   Overactive bladder 10/14/2009   Optic neuritis 11/08/2007   Polyarthropathy or polyarthritis of multiple sites 08/19/2007   Hypothyroidism 06/06/2007   Anxiety and depression 06/06/2007   Depression, recurrent (HCC) 06/06/2007   GERD 06/06/2007    Allergies  Allergen Reactions   Bee Venom Anaphylaxis, Itching and Swelling    Affected area Affected area Affected area Affected area    Fish Allergy Hives, Swelling and Rash    Facial swelling  Facial swelling  Facial swelling Facial swelling  Facial swelling    Latex Anaphylaxis, Rash and  Shortness Of Breath    Rash  Rash     Peanut-Containing Drug Products Anaphylaxis   Prasterone Other (See Comments) and Nausea And Vomiting    rash Other reaction(s): Headache Headaches.   Shellfish Allergy Hives, Other (See Comments), Rash and Swelling    Facial swelling Uncoded Allergy. Allergen: seafood Uncoded Allergy. Allergen: CATS, Other Reaction: itch, wheezing Uncoded Allergy. Allergen: COMPAZINE, Other Reaction: tremors Facial swelling Facial swelling Uncoded Allergy. Allergen: seafood Uncoded Allergy. Allergen: CATS, Other Reaction: itch, wheezing Uncoded Allergy. Allergen: COMPAZINE, Other Reaction: tremors Facial swelling Uncoded Allergy. Allergen: seafood Uncoded Allergy. Allergen: CATS, Other Reaction: itch, wheezing Uncoded Allergy. Allergen: COMPAZINE, Other Reaction: tremors Facial swelling Facial swelling Uncoded Allergy. Allergen: seafood Uncoded Allergy. Allergen: CATS, Other Reaction: itch, wheezing Uncoded Allergy. Allergen: COMPAZINE, Other Reaction: tremors Facial swelling Facial swelling Uncoded Allergy. Allergen: seafood Uncoded Allergy. Allergen: CATS, Other Reaction: itch, wheezing Uncoded Allergy. Allergen: COMPAZINE, Other Reaction: tremors    Shellfish-Derived Products Hives, Other (See Comments), Rash and Swelling    Facial swelling Uncoded Allergy. Allergen: seafood Uncoded Allergy. Allergen:  CATS, Other Reaction: itch, wheezing Uncoded Allergy. Allergen: COMPAZINE, Other Reaction: tremors Facial swelling Facial swelling Uncoded Allergy. Allergen: seafood Uncoded Allergy. Allergen: CATS, Other Reaction: itch, wheezing Uncoded Allergy. Allergen: COMPAZINE, Other Reaction: tremors Facial swelling Uncoded Allergy. Allergen: seafood Uncoded Allergy. Allergen: CATS, Other Reaction: itch, wheezing Uncoded Allergy. Allergen: COMPAZINE, Other Reaction: tremors Facial swelling   Compazine [Prochlorperazine Edisylate] Other (See Comments)     tremors   Dhea [Nutritional Supplements] Other (See Comments)    Headaches.   Dilaudid [Hydromorphone Hcl]     ? reaction   Famotidine Other (See Comments) and Diarrhea    Other reaction(s): Headache Gas   Gabapentin Other (See Comments)    'snowed out'    Lactose    Lactose Intolerance (Gi)    Other Other (See Comments)    Other reaction(s): Unknown    Prochlorperazine     Other reaction(s): Other (See Comments) ticks   Promethazine     Other reaction(s): Other (See Comments) Ticks   Promethazine Hcl Other (See Comments)    CNS disorder   Reclast [Zoledronic Acid]     Weakness could not move limbs, fatigue, increase sleep   Clarithromycin Hives and Rash   Clotrimazole Swelling and Rash   Hydromorphone Hcl Rash and Hives    "Rash all over"   Penicillins Rash and Other (See Comments)    Has patient had a PCN reaction causing immediate rash, facial/tongue/throat swelling, SOB or lightheadedness with hypotension:No Has patient had a PCN reaction causing severe rash involving mucus membranes or skin necrosis:No Has patient had a PCN reaction that required hospitalization:No Has patient had a PCN reaction occurring within the last 10 years:No If all of the above answers are "NO", then may proceed with Cephalosporin use.    Immunization History  Administered Date(s) Administered   Influenza Inj Mdck Quad Pf 08/30/2017   Influenza Split 08/13/2011, 08/19/2012, 09/18/2013   Influenza Whole 09/13/2007, 09/12/2008, 08/06/2010   Influenza, Seasonal, Injecte, Preservative Fre 08/09/2023   Influenza,inj,Quad PF,6+ Mos 08/13/2011, 08/19/2012, 09/18/2013, 08/21/2014, 09/18/2014, 09/02/2015, 09/01/2016, 08/30/2017, 08/19/2018, 07/28/2019, 08/06/2020, 08/21/2021, 07/23/2022   Influenza-Unspecified 09/02/2015, 08/30/2017   PFIZER(Purple Top)SARS-COV-2 Vaccination 03/19/2020, 04/09/2020, 11/06/2020   Pfizer Covid-19 Vaccine Bivalent Booster 19yrs & up 04/21/2021, 10/21/2021   Pneumococcal  Conjugate-13 11/30/2004, 06/04/2014, 09/03/2014   Pneumococcal Polysaccharide-23 11/30/2004, 09/05/2012, 01/21/2022   Td 11/30/2004   Tdap 09/11/2014, 11/05/2014, 10/19/2016    Past Surgical History:  Procedure Laterality Date   ADENOIDECTOMY     APPENDECTOMY     CERVICAL BIOPSY  W/ LOOP ELECTRODE EXCISION  2005   CHOLECYSTECTOMY     COLONOSCOPY N/A 10/16/2022   Procedure: COLONOSCOPY;  Surgeon: Regis Bill, MD;  Location: [facility] ENDOSCOPY;  Service: Gastroenterology;  Laterality: N/A;   ESOPHAGOGASTRODUODENOSCOPY N/A 10/16/2022   Procedure: ESOPHAGOGASTRODUODENOSCOPY (EGD);  Surgeon: Regis Bill, MD;  Location: [facility] ENDOSCOPY;  Service: Gastroenterology;  Laterality: N/A;   ESOPHAGOGASTRODUODENOSCOPY (EGD) WITH PROPOFOL N/A 08/27/2017   Procedure: ESOPHAGOGASTRODUODENOSCOPY (EGD) WITH PROPOFOL;  Surgeon: Scot Jun, MD;  Location: [facility] ENDOSCOPY;  Service: Endoscopy;  Laterality: N/A;   FRACTURE SURGERY  11/30/2008   HAND SURGERY     repair of left metatarsal    HARDWARE REMOVAL Right 02/16/2017   Procedure: HARDWARE REMOVAL FROM HIP;  Surgeon: Kennedy Bucker, MD;  Location: [facility] ORS;  Service: Orthopedics;  Laterality: Right;   JOINT REPLACEMENT     OTHER SURGICAL HISTORY     left 3rd metatarsal fracture repair 2011    REVISION TOTAL HIP  ARTHROPLASTY  06/2009   replacement 2010 then screw and plate removal 1610; right hip   TONSILLECTOMY     TONSILLECTOMY     wrist  ligament repair bilateral     WRIST RECONSTRUCTION     WRIST SURGERY     laceration of right wrist ligaments   wrist surgery     laceration of left wrist surgery     Social History   Tobacco Use   Smoking status: Never   Smokeless tobacco: Never  Vaping Use   Vaping status: Never Used  Substance Use Topics   Alcohol use: No   Drug use: No    Family History  Problem Relation Age of Onset   Hypertension Mother    Arthritis Mother    Heart disease Mother        afib   Asthma Mother     Asthma Sister    Asthma Daughter    Arthritis Daughter    Heart disease Daughter        ?heart condition on BB   ADD / ADHD Daughter    Depression Daughter    Intellectual disability Daughter    Learning disabilities Daughter    Pancreatic cancer Father    Cancer Father        pancreatitic    Diabetes Maternal Grandmother    Heart disease Maternal Grandmother    Arthritis Maternal Grandmother    Hypertension Maternal Grandmother    Dementia Maternal Grandmother    Colon cancer Maternal Uncle 45   Crohn's disease Maternal Aunt    Breast cancer Maternal Aunt 33   Diabetes Maternal Aunt    Breast cancer Cousin 58       maternal   Cancer Cousin        m cousin breat cancer s/p removal both breasts         09/15/2023   10:32 AM 09/02/2023    1:39 PM 08/09/2023    8:39 AM 05/26/2023    3:58 PM  GAD 7 : Generalized Anxiety Score  Nervous, Anxious, on Edge 2 1 2 1   Control/stop worrying 2 1 3 3   Worry too much - different things 2 1 3 3   Trouble relaxing 2 1 3 3   Restless 0 0 1 0  Easily annoyed or irritable 0 0 1 0  Afraid - awful might happen 2 1 2 2   Total GAD 7 Score 10 5 15 12   Anxiety Difficulty Somewhat difficult Somewhat difficult Extremely difficult Somewhat difficult       09/15/2023   10:31 AM 09/02/2023    1:39 PM 08/09/2023    8:38 AM  Depression screen PHQ 2/9  Decreased Interest 0 0 0  Down, Depressed, Hopeless 2 1 1   PHQ - 2 Score 2 1 1   Altered sleeping 2 2 1   Tired, decreased energy 2 2 0  Change in appetite 2 2 0  Feeling bad or failure about yourself  2 0 1  Trouble concentrating 0 0 0  Moving slowly or fidgety/restless 0 0 0  Suicidal thoughts 0 0 0  PHQ-9 Score 10 7 3   Difficult doing work/chores Somewhat difficult Somewhat difficult Somewhat difficult    BP Readings from Last 3 Encounters:  10/18/23 (!) 142/90  10/08/23 110/82  10/04/23 130/76    Wt Readings from Last 3 Encounters:  10/18/23 176 lb (79.8 kg)  10/08/23 178 lb (80.7  kg)  10/04/23 181 lb 6 oz (82.3 kg)    BP Marland Kitchen)  142/90   Pulse 94   Temp 98.4 F (36.9 C) (Oral)   Ht 5\' 10"  (1.778 m)   Wt 176 lb (79.8 kg)   LMP 12/15/2015 Comment: neg urine test   SpO2 97%   BMI 25.25 kg/m   Physical Exam Vitals and nursing note reviewed.  Constitutional:      Appearance: Normal appearance.  Cardiovascular:     Rate and Rhythm: Normal rate.  Pulmonary:     Effort: Pulmonary effort is normal.  Abdominal:     General: There is no distension.     Tenderness: There is generalized abdominal tenderness and tenderness in the epigastric area and suprapubic area. There is no right CVA tenderness or left CVA tenderness.  Musculoskeletal:        General: Normal range of motion.  Skin:    General: Skin is warm and dry.  Neurological:     Mental Status: She is alert and oriented to person, place, and time.     Gait: Gait is intact.  Psychiatric:        Mood and Affect: Affect normal. Mood is anxious.     Recent Labs     Component Value Date/Time   NA 138 09/22/2023 0833   NA 141 09/02/2023 1443   NA 141 09/25/2014 0015   K 3.4 (L) 09/22/2023 0833   K 4.2 09/25/2014 0015   CL 110 09/22/2023 0833   CL 110 (H) 09/25/2014 0015   CO2 18 (L) 09/22/2023 0833   CO2 24 09/25/2014 0015   GLUCOSE 108 (H) 09/22/2023 0833   GLUCOSE 121 (H) 09/25/2014 0015   BUN 12 09/22/2023 0833   BUN 13 09/02/2023 1443   BUN 21 (H) 09/25/2014 0015   CREATININE 0.99 09/22/2023 0833   CREATININE 0.93 09/25/2014 0015   CALCIUM 8.9 09/22/2023 0833   CALCIUM 8.4 (L) 09/25/2014 0015   PROT 7.1 09/22/2023 0833   PROT 7.1 09/02/2023 1443   PROT 6.6 09/25/2014 0015   ALBUMIN 4.6 09/22/2023 0833   ALBUMIN 4.9 09/02/2023 1443   ALBUMIN 3.9 09/25/2014 0015   AST 22 09/22/2023 0833   AST 26 09/25/2014 0015   ALT 23 09/22/2023 0833   ALT 27 09/25/2014 0015   ALKPHOS 81 09/22/2023 0833   ALKPHOS 55 09/25/2014 0015   BILITOT 0.7 09/22/2023 0833   BILITOT <0.2 09/02/2023 1443    BILITOT 0.4 09/25/2014 0015   GFRNONAA >60 09/22/2023 0833   GFRNONAA >60 09/25/2014 0015   GFRNONAA >60 07/03/2014 0122   GFRAA >60 04/17/2020 0724   GFRAA >60 09/25/2014 0015   GFRAA >60 07/03/2014 0122    Lab Results  Component Value Date   WBC 4.2 09/22/2023   HGB 14.9 09/22/2023   HCT 42.8 09/22/2023   MCV 82.5 09/22/2023   PLT 151 09/22/2023   Lab Results  Component Value Date   HGBA1C 6.0 (A) 04/29/2022   Lab Results  Component Value Date   CHOL 166 08/26/2021   HDL 61.30 08/26/2021   LDLCALC 91 08/26/2021   TRIG 66.0 08/26/2021   CHOLHDL 3 08/26/2021   Lab Results  Component Value Date   TSH 1.730 09/02/2023     Assessment and Plan:  1. Urinary frequency Dipstick clear today confirming clearance of infection with nitrofurantoin.  Patient informed.  It is not clear to me what might be causing her urinary symptoms at this time, perhaps reactivation of OAB.  Offered pharmacotherapy for this but patient would like to consult with urology first. -  POCT urinalysis dipstick  2. Urinary incontinence, unspecified type Referring to urology for further evaluation - Ambulatory referral to Urology  3. Near syncope Patient with many reasons for dizziness including multiple chronic autoimmune conditions and polypharmacy.  No changes to meds at this time, encouraged adequate hydration, nutrition, and slow positional changes.  4. Polypharmacy Referring to pharmacy for evaluation of med list and to see if there are any relevant medication interactions. - Amb Referral to Clinical Pharmacist  5. Diarrhea due to malabsorption Refilling colestipol as requested. - colestipol (COLESTID) 1 g tablet; Take 3 tablets (3 g total) by mouth 2 (two) times daily.  Dispense: 180 tablet; Refill: 1     Return TBD pending specialist evals.    Alvester Morin, PA-C, DMSc, Nutritionist [facility] [phone]

## 2023-10-20 ENCOUNTER — Emergency Department
Admission: EM | Admit: 2023-10-20 | Discharge: 2023-10-20 | Discharge status: Against Medical Advice | Payer: Medicare Other | Attending: Emergency Medicine | Admitting: Emergency Medicine

## 2023-10-20 ENCOUNTER — Other Ambulatory Visit: Payer: Self-pay

## 2023-10-20 ENCOUNTER — Encounter: Payer: Self-pay | Admitting: Emergency Medicine

## 2023-10-20 DIAGNOSIS — R42 Dizziness and giddiness: Secondary | ICD-10-CM | POA: Diagnosis not present

## 2023-10-20 DIAGNOSIS — M542 Cervicalgia: Secondary | ICD-10-CM | POA: Diagnosis not present

## 2023-10-20 DIAGNOSIS — Z5321 Procedure and treatment not carried out due to patient leaving prior to being seen by health care provider: Secondary | ICD-10-CM | POA: Insufficient documentation

## 2023-10-20 DIAGNOSIS — R109 Unspecified abdominal pain: Secondary | ICD-10-CM | POA: Insufficient documentation

## 2023-10-20 DIAGNOSIS — R55 Syncope and collapse: Secondary | ICD-10-CM | POA: Diagnosis not present

## 2023-10-20 DIAGNOSIS — R509 Fever, unspecified: Secondary | ICD-10-CM | POA: Diagnosis not present

## 2023-10-20 DIAGNOSIS — R1084 Generalized abdominal pain: Secondary | ICD-10-CM | POA: Diagnosis not present

## 2023-10-20 DIAGNOSIS — R0689 Other abnormalities of breathing: Secondary | ICD-10-CM | POA: Diagnosis not present

## 2023-10-20 DIAGNOSIS — R519 Headache, unspecified: Secondary | ICD-10-CM | POA: Insufficient documentation

## 2023-10-20 LAB — BASIC METABOLIC PANEL
Anion gap: 9 (ref 5–15)
BUN: 10 mg/dL (ref 6–20)
CO2: 16 mmol/L — ABNORMAL LOW (ref 22–32)
Calcium: 8.6 mg/dL — ABNORMAL LOW (ref 8.9–10.3)
Chloride: 112 mmol/L — ABNORMAL HIGH (ref 98–111)
Creatinine, Ser: 0.98 mg/dL (ref 0.44–1.00)
GFR, Estimated: >60 mL/min (ref >60)
Glucose, Bld: 91 mg/dL (ref 70–99)
Potassium: 3.7 mmol/L (ref 3.5–5.1)
Sodium: 137 mmol/L (ref 135–145)

## 2023-10-20 LAB — CBC
HCT: 47 % — ABNORMAL HIGH (ref 36.0–46.0)
Hemoglobin: 15.9 g/dL — ABNORMAL HIGH (ref 12.0–15.0)
MCH: 28.5 pg (ref 26.0–34.0)
MCHC: 33.8 g/dL (ref 30.0–36.0)
MCV: 84.4 fL (ref 80.0–100.0)
Platelets: 127 10*3/uL — ABNORMAL LOW (ref 150–400)
RBC: 5.57 MIL/uL — ABNORMAL HIGH (ref 3.87–5.11)
RDW: 13.7 % (ref 11.5–15.5)
WBC: 5.3 10*3/uL (ref 4.0–10.5)
nRBC: 0 % (ref 0.0–0.2)

## 2023-10-20 LAB — TROPONIN I (HIGH SENSITIVITY): Troponin I (High Sensitivity): 3 ng/L (ref <18)

## 2023-10-20 LAB — MAGNESIUM: Magnesium: 2 mg/dL (ref 1.7–2.4)

## 2023-10-20 LAB — LIPASE, BLOOD: Lipase: 21 U/L (ref 11–51)

## 2023-10-20 MED ORDER — SODIUM CHLORIDE 0.9 % IV BOLUS: 1000.0000 mL | Freq: Once | INTRAVENOUS | Status: DC

## 2023-10-20 NOTE — Telephone Encounter (Signed)
Please review.  KP

## 2023-10-20 NOTE — ED Triage Notes (Signed)
Patient to ED via [facility] for near syncope. States she has been having abd pain since. Has appointment with GI on Monday. States she has been extremely nauseous. Also have headache/neck pain. Hx of parkinsons Took zofran but hasn't help nausea.  Took covid vaccine yesterday at [employer]

## 2023-10-20 NOTE — ED Notes (Signed)
Pt to front desk stating that she is leaving because she wants to go home and lay down. Pt request assistance with a ride home. Pt informed we do not provide transportation assistance to pt's who leave AMA. Pt's IV removed.

## 2023-10-20 NOTE — ED Notes (Signed)
Pt to ED via [facility] from home for fever, abdominal pain, nausea, dizziness, and several near syncopal episodes. Pt reports that she got her COVID shot yesterday and symptoms started this morning around 4 am.  BP: 120/76 HR: 70 SpO2: 97% Access: 20 G Lt. Hand

## 2023-10-21 ENCOUNTER — Telehealth: Payer: Self-pay

## 2023-10-21 NOTE — Progress Notes (Signed)
   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 [name] Charolett Bumpers, MD was referred to the pharmacist for assistance related to  polypharmacy  .    Successful contact was made with the patient to discuss pharmacy services including being ready for the pharmacist to call at least 5 minutes before the scheduled appointment time, to have medication bottles and any blood sugar or blood pressure readings ready for review. The patient agreed to meet with the pharmacist via with the pharmacist via telephone visit on (date/time).  11/05/2023  Penne Lash , RMA     [place]  [facility], [facility] Guide  Direct Dial: [phone]  Website: [email or web address]

## 2023-10-22 ENCOUNTER — Ambulatory Visit: Payer: Medicare Other | Admitting: Physician Assistant

## 2023-10-22 NOTE — Telephone Encounter (Signed)
Please review.  KP

## 2023-10-25 ENCOUNTER — Ambulatory Visit: Payer: Medicare Other | Admitting: Gastroenterology

## 2023-10-25 ENCOUNTER — Ambulatory Visit: Payer: Self-pay

## 2023-10-25 NOTE — Telephone Encounter (Signed)
Chief Complaint: Diarrhea  Symptoms: 20 episodes of Diarrhea in the past 24hrs, nausea, vomiting, abdominal pain, palpitations  Frequency: Chronic onset 1 year and half ago  Pertinent Negatives: Patient denies blood in stool, dehydration  Disposition: [] ED /[] Urgent Care (no appt availability in office) / [] Appointment(In office/virtual)/ []  [facility]/ [] Home Care/ [] Refused Recommended Disposition /[] [facility]/ [x]  Follow-up with PCP Additional Notes: Patient states she has had explosive diarrhea ongoing for over one year. Patient reports she was seeing a GI provider that did not help her. She stated she had a new patient appointment scheduled today at [facility] but she was refused when she arrived because they stated she has already established care with another GI provider. Patient is very upset and crying over the phone stating she does not know what to do she can not continue living like this. She can not eat regular food, get out of bed or take care of her daughter. She has been refused by [facility] and [facility] as well. She does not want to be seen by  Dr. Mia Creek anymore but no one else will take her. Patient was given care advice and she stated she is doing all of theses things. Requsting recommendations from PCP. Please advise. Declined appointment with PCP. Reason for Disposition  [1] SEVERE diarrhea (e.g., 7 or more times / day more than normal) AND [2] present > 24 hours (1 day)  Answer Assessment - Initial Assessment Questions 1. DIARRHEA SEVERITY: "How bad is the diarrhea?" "How many more stools have you had in the past 24 hours than normal?"    - NO DIARRHEA (SCALE 0)   - MILD (SCALE 1-3): Few loose or mushy BMs; increase of 1-3 stools over normal daily number of stools; mild increase in ostomy output.   -  MODERATE (SCALE 4-7): Increase of 4-6 stools daily over normal; moderate increase in ostomy output.   -  SEVERE (SCALE 8-10; OR "WORST POSSIBLE"): Increase of 7  or more stools daily over normal; moderate increase in ostomy output; incontinence.     20 in the past 24hrs  2. ONSET: "When did the diarrhea begin?"      Ongoing for about 1 year and a half  3. BM CONSISTENCY: "How loose or watery is the diarrhea?"      Watery  4. VOMITING: "Are you also vomiting?" If Yes, ask: "How many times in the past 24 hours?"      Yes  5. ABDOMEN PAIN: "Are you having any abdomen pain?" If Yes, ask: "What does it feel like?" (e.g., crampy, dull, intermittent, constant)      Yes,  6. ABDOMEN PAIN SEVERITY: If present, ask: "How bad is the pain?"  (e.g., Scale 1-10; mild, moderate, or severe)   - MILD (1-3): doesn't interfere with normal activities, abdomen soft and not tender to touch    - MODERATE (4-7): interferes with normal activities or awakens from sleep, abdomen tender to touch    - SEVERE (8-10): excruciating pain, doubled over, unable to do any normal activities       9/10 7. ORAL INTAKE: If vomiting, "Have you been able to drink liquids?" "How much liquids have you had in the past 24 hours?" Yes 8. HYDRATION: "Any signs of dehydration?" (e.g., dry mouth [not just dry lips], too weak to stand, dizziness, new weight loss) "When did you last urinate?"     I don't feel dehydrated  9. EXPOSURE: "Have you traveled to a foreign country  recently?" "Have you been exposed to anyone with diarrhea?" "Could you have eaten any food that was spoiled?"     No  10. ANTIBIOTIC USE: "Are you taking antibiotics now or have you taken antibiotics in the past 2 months?"       No  11. OTHER SYMPTOMS: "Do you have any other symptoms?" (e.g., fever, blood in stool)       Nausea, headaches, palpitations,  Protocols used: Diarrhea-A-AH

## 2023-10-25 NOTE — Telephone Encounter (Signed)
Please review.  KP

## 2023-10-26 NOTE — Telephone Encounter (Signed)
I have no further recommendations at this time for this acute on chronic complaint.

## 2023-10-27 ENCOUNTER — Other Ambulatory Visit: Payer: Self-pay | Admitting: Physician Assistant

## 2023-10-27 DIAGNOSIS — K529 Noninfective gastroenteritis and colitis, unspecified: Secondary | ICD-10-CM

## 2023-11-01 ENCOUNTER — Encounter: Payer: Self-pay | Admitting: Cardiovascular Disease

## 2023-11-01 ENCOUNTER — Ambulatory Visit: Payer: Medicare Other | Attending: Cardiovascular Disease | Admitting: Cardiovascular Disease

## 2023-11-01 ENCOUNTER — Ambulatory Visit: Payer: Medicare Other

## 2023-11-01 VITALS — BP 120/80 | HR 87 | Ht 70.0 in | Wt 172.2 lb

## 2023-11-01 DIAGNOSIS — M542 Cervicalgia: Secondary | ICD-10-CM | POA: Diagnosis not present

## 2023-11-01 DIAGNOSIS — R002 Palpitations: Secondary | ICD-10-CM

## 2023-11-01 DIAGNOSIS — M79642 Pain in left hand: Secondary | ICD-10-CM | POA: Diagnosis not present

## 2023-11-01 DIAGNOSIS — R0602 Shortness of breath: Secondary | ICD-10-CM | POA: Diagnosis not present

## 2023-11-01 DIAGNOSIS — F419 Anxiety disorder, unspecified: Secondary | ICD-10-CM | POA: Diagnosis not present

## 2023-11-01 DIAGNOSIS — R079 Chest pain, unspecified: Secondary | ICD-10-CM | POA: Diagnosis not present

## 2023-11-01 DIAGNOSIS — F32A Depression, unspecified: Secondary | ICD-10-CM

## 2023-11-01 DIAGNOSIS — M25569 Pain in unspecified knee: Secondary | ICD-10-CM | POA: Diagnosis not present

## 2023-11-01 DIAGNOSIS — G894 Chronic pain syndrome: Secondary | ICD-10-CM | POA: Diagnosis not present

## 2023-11-01 NOTE — Progress Notes (Addendum)
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      HPI:  [name] [name] is a 57 year old woman with past medical history of Lupus  Parkinson's Asthma Anxiety, followed by psychiatry Extensive imaging history Who presents for follow-up of her shortness of breath, chest pain, dizziness  Recently seen in clinic October 04, 2023, no active cardiac issues on that visit Atypical chest pain, anxiety concerning mother  Echocardiogram October 06, 2023 Normal ejection fraction, normal RV size and function, normal pressures, mild to moderate MR  Seen in the emergency room October 20, 2023 fever abdominal pain nausea dizziness several near syncopal episodes Had her COVID shot day before Blood pressure 120 systolic Was scheduled to see gastroenterology in follow-up Was taking Zofran for nausea Left AMA so she could go lay down at home  Lab workup October 20, 2023 was benign  Seen in the ER September 22, 2023 for shortness of breath Chest tightness when climbing stairs, asthma, palpitations Workup negative, and was discharged  Still with "explosive diarrhea",  sometimes too many to count , chronic nausea Seen by GI in the past, on colestipol, does not help symptoms Sees GI tomorrow, as per digestive Previous the completed EGD and colonoscopy Completed barium swallow Started after eating steak, no other family members got ill Nothing working to slow it down, tried imodium, nothing seems to help  Has appt with rheumatology march 2025 She is concerned about lupus flare Reports symptoms did not improve with steroids apart from joint pain  Having GERD sx Topamax for  headaches, not working  Drinks carnation for breakfast, has to force it down  Orthostatics negative on today's visit, difficulty standing gets dizzy, uses a cane  EKG personally reviewed by myself on todays visit EKG Interpretation Date/Time:  Monday November 01 2023 14:01:41 EST Ventricular Rate:  87 PR Interval:  114 QRS Duration:  84 QT Interval:  390 QTC Calculation: 469 R Axis:   20  Text Interpretation: Normal sinus rhythm Normal ECG When compared with ECG of 20-Oct-2023 16:54, ST elevation now present in Inferior leads T wave inversion no longer evident in Inferior leads T wave inversion no longer evident in Anterolateral leads Confirmed by Julien Nordmann [phone]) on 11/01/2023 2:13:32 PM   Prior records were reviewed Prior cardiac workup dating back to 2016 with stress test Normal echocardiogram September 2018 for chest pain Repeat stress test June 2019 CT chest November 2020 no evident coronary calcification or aortic atherosclerosis CT abdomen pelvis 2022 no significant aortic atherosclerosis Carotid ultrasound June 2024 no significant disease  Cardiac CTA July 28, 2018 with coronary calcium score 0, no significant coronary disease 1. Normal origin of the coronary arteries.  2. No evidence for atherosclerotic disease (CAD RADS 0).  3. 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   It was felt chest tightness and shortness of breath was secondary to asthma when climbing stairs  Intermittent palpitations Prior Holter monitor unremarkable revealing PVCs, PACs  Seen in the emergency room September 22, 2019 for shortness of breath at rest and with exertion Also with some chest pain, stabbing on the left Cardiac workup  negative  Chronic symptoms of dizziness, palpitations, shortness of breath on exertion, cold intolerance  Walks with cane Daughter with disability Mother with dementia  Upset today, mother left to  live with other sister in new [place] Anxiety issues In bed all the time, depressed Spends lots of time in bed  Orthostatics negative  Financial stress, dog/vet bills, $9000 on her credit cards  EKG personally reviewed by myself on todays visit   EKG Interpretation Date/Time:  Monday November 01 2023 14:01:41 EST Ventricular Rate:  87 PR Interval:  114 QRS Duration:  84 QT Interval:  390 QTC Calculation: 469 R Axis:   20  Text Interpretation: Normal sinus rhythm Normal ECG When compared with ECG of 20-Oct-2023 16:54, ST elevation now present in Inferior leads T wave inversion no longer evident in Inferior leads T wave inversion no longer evident in Anterolateral leads Confirmed by Julien Nordmann [phone]) on 11/01/2023 2:13:32 PM     PMH:   has a past medical history of ADHD, Allergy (12/30/2018), Anemia, ANXIETY (06/06/2007), Arthritis, Asthma, BACK PAIN (02/22/2009), Cervical cancer (HCC), Chicken pox, Chronic pain, Collagen vascular disease (HCC), Connective tissue disease (HCC), DEPRESSION (06/06/2007), Essential tremor, Fibromyalgia, Fibromyalgia, FREQUENCY, URINARY (07/07/2007), Gastritis, GERD (06/06/2007), Glaucoma, Headache, History of hiatal hernia, History of kidney stones, HYPOTHYROIDISM (06/06/2007), INTERSTITIAL CYSTITIS (09/11/2010), Lupus (2006), Lupus, Menopause, Migraine, Mitral valve regurgitation, NEPHROLITHIASIS, HX OF (11/05/2010), Neuropathy, Neuropathy, OCD (obsessive compulsive disorder), Optic neuritis, OSTEOPENIA (10/14/2009), Osteoporosis, OVERACTIVE BLADDER (10/14/2009), PAIN, CHRONIC NEC (06/06/2007), Panniculitis, Parkinson disease (HCC) (04/03/2019), Parkinson disease (HCC), POLYARTHRITIS (08/19/2007), Raynaud's disease, Sicca syndrome (HCC), Ulcer (11/30/2017), UNSPECIFIED OPTIC NEURITIS (11/08/2007), Vaginal prolapse, Vasculitis (HCC), and WEIGHT GAIN (02/22/2009).  PSH:    Past Surgical History:  Procedure Laterality Date   ADENOIDECTOMY     APPENDECTOMY      CERVICAL BIOPSY  W/ LOOP ELECTRODE EXCISION  2005   CHOLECYSTECTOMY     COLONOSCOPY N/A 10/16/2022   Procedure: COLONOSCOPY;  Surgeon: Regis Bill, MD;  Location: [facility] ENDOSCOPY;  Service: Gastroenterology;  Laterality: N/A;   ESOPHAGOGASTRODUODENOSCOPY N/A 10/16/2022   Procedure: ESOPHAGOGASTRODUODENOSCOPY (EGD);  Surgeon: Regis Bill, MD;  Location: [facility] ENDOSCOPY;  Service: Gastroenterology;  Laterality: N/A;   ESOPHAGOGASTRODUODENOSCOPY (EGD) WITH PROPOFOL N/A 08/27/2017   Procedure: ESOPHAGOGASTRODUODENOSCOPY (EGD) WITH PROPOFOL;  Surgeon: Scot Jun, MD;  Location[facility] ENDOSCOPY;  Service: Endoscopy;  Laterality: N/A;   FRACTURE SURGERY  11/30/2008   HAND SURGERY     repair of left metatarsal    HARDWARE REMOVAL Right 02/16/2017   Procedure: HARDWARE REMOVAL FROM HIP;  Surgeon: Kennedy Bucker, MD;  Location: [facility] ORS;  Service: Orthopedics;  Laterality: Right;   JOINT REPLACEMENT     OTHER SURGICAL HISTORY     left 3rd metatarsal fracture repair 2011    REVISION TOTAL HIP ARTHROPLASTY  06/2009   replacement 2010 then screw and plate removal 1324; right hip   TONSILLECTOMY     TONSILLECTOMY     wrist  ligament repair bilateral     WRIST RECONSTRUCTION     WRIST SURGERY     laceration of right wrist ligaments   wrist surgery     laceration of left wrist surgery     Current Outpatient Medications  Medication Sig Dispense Refill   albuterol (PROVENTIL) (2.5 MG/3ML) 0.083% nebulizer solution Take 3 mLs (2.5 mg total) by nebulization every 6 (six) hours as needed for wheezing or shortness of breath. 150 mL 12   albuterol (VENTOLIN HFA) 108 (90 Base) MCG/ACT inhaler INHALE  1 TO 2 PUFFS INTO THE LUNGS EVERY 4 HOURS AS NEEDED FOR WHEEZING OR SHORTNESS OF BREATH 18 g 11   calcium citrate (CALCITRATE - DOSED IN MG ELEMENTAL CALCIUM) 950 (200 Ca) MG tablet Take 500 mg of elemental calcium by mouth daily.     carbidopa-levodopa (SINEMET CR) 50-200 MG tablet Take 1  tablet by mouth 5 (five) times daily.     colestipol (COLESTID) 1 g tablet Take 3 tablets (3 g total) by mouth 2 (two) times daily. 180 tablet 1   dexlansoprazole (DEXILANT) 60 MG capsule Take 1 capsule (60 mg total) by mouth daily. 90 capsule 3   diclofenac sodium (VOLTAREN) 1 % GEL Apply 2 g topically 4 (four) times daily as needed (for knee & finger pain.).      dicyclomine (BENTYL) 20 MG tablet Take 1 tablet (20 mg total) by mouth every 6 (six) hours. (Patient taking differently: Take 20 mg by mouth every 6 (six) hours. Pt is taking 3 times a daily) 120 tablet 2   DULoxetine (CYMBALTA) 60 MG capsule Take 1 capsule (60 mg total) by mouth 2 (two) times daily. 120 capsule 1   EPINEPHrine 0.3 mg/0.3 mL IJ SOAJ injection Inject 0.3 mg into the muscle as needed for anaphylaxis. 1 each 2   HYDROcodone-acetaminophen (NORCO/VICODIN) 5-325 MG tablet Take 2x"s daily     hydroxychloroquine (PLAQUENIL) 200 MG tablet Plaquenil 200 mg tablet  Take 1 tablet twice a day by oral route after meals for 90 days.     lactase (LACTAID) 3000 units tablet Take 3,000 Units by mouth 3 (three) times daily with meals.     latanoprost (XALATAN) 0.005 % ophthalmic solution 1 drop at bedtime.     levothyroxine (SYNTHROID) 75 MCG tablet TAKE 1 TABLET BY MOUTH DAILY 30 MINUTES BEFORE BREAKFAST 90 tablet 3   methocarbamol (ROBAXIN) 750 MG tablet Take 1 tablet (750 mg total) by mouth every 6 (six) hours as needed. 120 tablet 5   montelukast (SINGULAIR) 10 MG tablet TAKE 1 TABLET BY MOUTH EVERY NIGHT AT BEDTIME 90 tablet 3   ondansetron (ZOFRAN-ODT) 8 MG disintegrating tablet TAKE 1 TABLET BY MOUTH EVERY 8 HOURS AS NEEDED FOR NAUSEA OR VOMTING 90 tablet 2   PAXIL 20 MG tablet      Polyvinyl Alcohol-Povidone (REFRESH OP) Place 1 drop into both eyes 2 (two) times daily.     topiramate (TOPAMAX) 50 MG tablet      triamcinolone ointment (KENALOG) 0.5 % Apply 1 Application topically 2 (two) times daily. 30 g 2   Zoledronic Acid  (RECLAST IV)      No current facility-administered medications for this visit.   Allergies:   Bee venom, Fish allergy, Latex, Peanut-containing drug products, Prasterone, Shellfish allergy, Shellfish-derived products, Compazine [prochlorperazine edisylate], Dhea [nutritional supplements], Dilaudid [hydromorphone hcl], Famotidine, Gabapentin, Lactose, Lactose intolerance (gi), Other, Prochlorperazine, Promethazine, Promethazine hcl, Reclast [zoledronic acid], Clarithromycin, Clotrimazole, Hydromorphone hcl, and Penicillins   Social History:  The patient  reports that she has never smoked. She has never used smokeless tobacco. She reports that she does not drink alcohol and does not use drugs.   Family History:   family history includes ADD / ADHD in her daughter; Arthritis in her daughter, maternal grandmother, and mother; Asthma in her daughter, mother, and sister; Breast cancer (age of onset: 17) in her cousin; Breast cancer (age of onset: 62) in her maternal aunt; Cancer in her cousin and father; Colon cancer (age of onset: 77) in her maternal  uncle; Crohn's disease in her maternal aunt; Dementia in her maternal grandmother; Depression in her daughter; Diabetes in her maternal aunt and maternal grandmother; Heart disease in her daughter, maternal grandmother, and mother; Hypertension in her maternal grandmother and mother; Intellectual disability in her daughter; Learning disabilities in her daughter; Pancreatic cancer in her father.   Review of Systems: Review of Systems  Constitutional:  Positive for malaise/fatigue.  HENT: Negative.    Respiratory:  Positive for shortness of breath.   Cardiovascular:  Positive for chest pain.  Gastrointestinal: Negative.   Musculoskeletal: Negative.   Neurological: Negative.   Psychiatric/Behavioral: Negative.    All other systems reviewed and are negative.  PHYSICAL EXAM: VS:  BP 120/80 (BP Location: Left Arm, Patient Position: Sitting, Cuff Size:  Normal)   Pulse 87   Ht 5\' 10"  (1.778 m)   Wt 172 lb 4 oz (78.1 kg)   LMP 12/15/2015 Comment: neg urine test   SpO2 98%   BMI 24.72 kg/m  , BMI Body mass index is 24.72 kg/m. Constitutional:  oriented to person, place, and time. No distress.  HENT:  Head: Grossly normal Eyes:  no discharge. No scleral icterus.  Neck: No JVD, no carotid bruits  Cardiovascular: Regular rate and rhythm, no murmurs appreciated Pulmonary/Chest: Clear to auscultation bilaterally, no wheezes or rails Abdominal: Soft.  no distension.  no tenderness.  Musculoskeletal: Normal range of motion Neurological:  normal muscle tone. Coordination normal. No atrophy Skin: Skin warm and dry Psychiatric: normal affect, pleasant  Recent Labs: 09/02/2023: TSH 1.730 09/22/2023: ALT 23; B Natriuretic Peptide 11.9 10/20/2023: BUN 10; Creatinine, Ser 0.98; Hemoglobin 15.9; Magnesium 2.0; Platelets 127; Potassium 3.7; Sodium 137    Lipid Panel Lab Results  Component Value Date   CHOL 166 08/26/2021   HDL 61.30 08/26/2021   LDLCALC 91 08/26/2021   TRIG 66.0 08/26/2021    Wt Readings from Last 3 Encounters:  11/01/23 172 lb 4 oz (78.1 kg)  10/20/23 168 lb (76.2 kg)  10/18/23 176 lb (79.8 kg)     ASSESSMENT AND PLAN:  Problem List Items Addressed This Visit     Anxiety and depression   Relevant Orders   EKG 12-Lead (Completed)   Other Visit Diagnoses     Shortness of breath    -  Primary   Relevant Orders   EKG 12-Lead (Completed)   Chest pain of uncertain etiology       Relevant Orders   EKG 12-Lead (Completed)      Chest pain Atypical in nature, cardiac workup dating back to 2016 for similar symptoms including stress test on multiple occasions, echo, cardiac CTA, all negative -Echocardiogram scheduled later this week -Likely brought on by life stressors No further workup needed at this time  Shortness of breath Deconditioned, symptoms dating back many years  normal echocardiogram, no  underlying coronary disease No further cardiac workup needed  Anxiety/weakness/dizziness Numerous life stressors She is looking for psychology, she does have psychiatry Would benefit from a therapist to discuss life stressors, financial stressors  Chronic diarrhea, nausea Previously seen by GI, not of the medications seem to be helping, GI issues for over 1 year Scheduled to see [facility] tomorrow  History of lupus Interestingly no significant improvement in symptoms on prednisone though did have improvement of joint pain Did not help dizziness, or cyst with multitude of other symptoms  Depression/anxiety Will defer to primary care Suspect a contributor to multitude of symptoms  Palpitations Likely secondary to issues above,  Prior Holter monitor with PACs PVCs Given her continued symptoms we have ordered 2-week Zio monitor for further evaluation   Signed, Dossie Arbour, M.D., Ph.D. [facility] [place], [place] [phone]

## 2023-11-01 NOTE — Patient Instructions (Addendum)
Medication Instructions:  No changes  If you need a refill on your cardiac medications before your next appointment, please call your pharmacy.   Lab work: No new labs needed  Testing/Procedures: Heart Monitor:  Your physician has requested you wear a ZIO monitor for 14 days.  Your monitor will be mailed to your home address within 3-5 business days. This is sent via [employer] from [employer]. However, if you have not received your monitor after 5 business days please send [place] a MyChart message or call the office at [phone], so we may follow up on this for you.   This monitor is a medical device (single patch monitor) that records the heart's electrical activity. Doctors most often use these monitors to diagnose arrhythmias. Arrhythmias are problems with the speed or rhythm of the heartbeat.   [employer] supplies 1 patch per enrollment. Additional stickers are not available.  Please DO NOT apply the patch if you will be having a Nuclear Stress Test, Echocardiogram, Cardiac CT, Cardiac MRI, Chest X-ray during the period you would be wearing the monitor. The patch cannot be worn during these tests.  You cannot remove and re-apply the ZIO patch monitor.   Applying the Monitor: Once you receive your monitor, this will include a small razor, abrader, and 4 alcohol pads. Shave hair from upper left chest Rub abrader disc in 40 strokes over the left upper chest as indicated in your monitor instructions Clean area with 4 enclosed alcohol pads (there may be a mild & brief stinging sensation over the newly abraded area, but this is normal). Let dry Apply patch as indicated in monitor instructions. Patch will be placed under collarbone on the left side of the chest with arrow pointing upward. Rub adhesive wings for 2 minutes. Remove white label marked "1". Remove the white label marked "2". Rub patch adhesive wings for an 2 minutes.  While looking in a mirror, press and release button in  the center of the patch. You may hear a "click". A small green light will flash 4-6 times and then stop. This will be your indicator that the monitor has been turned on.  Wearing the Monitor: Avoid showering during the first 24 hours of wearing the monitor.  After 24 hours you may shower with the patch on. Take brief showers with your back facing the shower head.  Avoid excessive sweating to help maximize wear time. Do not submerge the device, no hot tubs, and no swimming pools. Keep any lotions or oils away from the patch. Press the button if you feel a symptom. You will hear a small click. Record date, time, and symptoms in the Patient Logbook or App.  Monitor Issues: Call [employer] Customer Care at [phone] if you have questions regarding your Zio Patch Monitor. Call them immediately if you see an orange/ amber colored light blinking on your monitor. If your monitor falls off and you cannot get this reapplied or if you need suggestions for securing your monitor call [employer] at [phone].   Returning the Monitor: Once you have completed wearing your monitor, follow instructions on the last 2 pages of the Patient Logbook. Stick monitor patch on to the last page of the Patient Logbook.  Place Patient Logbook with monitor in the return box provided. Use locking tab on box and tape box closed securely. The return box has pre-paid postage on it.  Place the return box in the regular [place] Mail box as soon as possible It will  take anywhere from 1-2 weeks for your provider to receive and review your results once you mail this back. If for some reason you have misplaced your return box then call our office and we can provide another box and/or mail it off for you.   Billing  and Patient Assistance Program Information: We have supplied [employer] with any of your insurance information on file for billing purposes. [employer] offers a sliding scale Patient Assistance Program for patients  that do not have insurance, or whose insurance does not completely cover the cost of the ZIO monitor. You must apply for the Patient Assistance Program to qualify for this discounted rate. To apply, please call [employer] at [phone], select option 1, ask to apply for the Patient Assistance Program. [employer] will ask your household income, and how many people are in your household. They will quote your out-of-pocket cost based on that information. [employer] will also be able to set up for a 8-month, interest-free payment plan if needed.     Follow-Up: At [facility], you and your health needs are our priority.  As part of our continuing mission to provide you with exceptional heart care, we have created designated Provider Care Teams.  These Care Teams include your primary Cardiologist (physician) and Advanced Practice Providers (APPs -  Physician Assistants and Nurse Practitioners) who all work together to provide you with the care you need, when you need it.  You will need a follow up appointment in 12 months  Providers on your designated Care Team:   Nicolasa Ducking, NP Eula Listen, PA-C Cadence Fransico [place], [place]  COVID-19 Vaccine Information can be found at: [email or web address] For questions related to vaccine distribution or appointments, please email [email or web address] or call [phone].

## 2023-11-02 DIAGNOSIS — R634 Abnormal weight loss: Secondary | ICD-10-CM | POA: Diagnosis not present

## 2023-11-02 DIAGNOSIS — R1013 Epigastric pain: Secondary | ICD-10-CM | POA: Diagnosis not present

## 2023-11-02 DIAGNOSIS — R109 Unspecified abdominal pain: Secondary | ICD-10-CM | POA: Diagnosis not present

## 2023-11-02 DIAGNOSIS — R1310 Dysphagia, unspecified: Secondary | ICD-10-CM | POA: Diagnosis not present

## 2023-11-03 ENCOUNTER — Other Ambulatory Visit: Payer: Self-pay

## 2023-11-03 ENCOUNTER — Ambulatory Visit: Payer: Medicare Other | Admitting: Physician Assistant

## 2023-11-03 DIAGNOSIS — G629 Polyneuropathy, unspecified: Secondary | ICD-10-CM

## 2023-11-03 DIAGNOSIS — J309 Allergic rhinitis, unspecified: Secondary | ICD-10-CM

## 2023-11-03 DIAGNOSIS — E039 Hypothyroidism, unspecified: Secondary | ICD-10-CM

## 2023-11-03 MED ORDER — MONTELUKAST SODIUM 10 MG PO TABS: ORAL_TABLET | ORAL | 3 refills | Status: AC

## 2023-11-03 MED ORDER — LEVOTHYROXINE SODIUM 75 MCG PO TABS: ORAL_TABLET | ORAL | 3 refills | Status: AC

## 2023-11-03 MED ORDER — TOPIRAMATE 50 MG PO TABS
50.0000 mg | ORAL_TABLET | Freq: Three times a day (TID) | ORAL | 0 refills | Status: DC
Start: 2023-11-03 — End: 2023-11-05

## 2023-11-05 ENCOUNTER — Other Ambulatory Visit: Payer: Self-pay | Admitting: Physician Assistant

## 2023-11-05 ENCOUNTER — Telehealth: Payer: Self-pay | Admitting: Pharmacist

## 2023-11-05 ENCOUNTER — Other Ambulatory Visit: Payer: Self-pay | Admitting: Pharmacist

## 2023-11-05 ENCOUNTER — Encounter: Payer: Self-pay | Admitting: Pharmacist

## 2023-11-05 DIAGNOSIS — G629 Polyneuropathy, unspecified: Secondary | ICD-10-CM

## 2023-11-05 MED ORDER — FLUTICASONE-SALMETEROL 250-50 MCG/ACT IN AEPB: 1.0000 | INHALATION_SPRAY | Freq: Two times a day (BID) | RESPIRATORY_TRACT | 11 refills | Status: AC

## 2023-11-05 MED ORDER — TOPIRAMATE 50 MG PO TABS: 150.0000 mg | ORAL_TABLET | Freq: Two times a day (BID) | ORAL | 2 refills | Status: AC

## 2023-11-05 NOTE — Progress Notes (Unsigned)
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   They were referred to the pharmacist by their PCP for assistance in managing complex medication management.    Subjective:  Care Team: Primary Care Provider: Remo Lipps, PA  Cardiologist: Antonieta Iba, MD Neurologist: Melburn Hake, MD; Next Scheduled Visit: 11/22/2023 Urologist: Alfredo Martinez, MD; Next Scheduled Visit: 12/06/2023  GI Specialist: [facility]; Next Scheduled Visit: 12/08/2023 Pain Specialist: Ronita Hipps, MD  Psychiatrist: Johny Blamer, DO - [facility] Rheumatologist: Candise Bowens, MD; Next Scheduled Visit: 01/17/2024 Endocrinologist: Shellia Cleverly, MD   Medication Access/Adherence  Current Pharmacy:  [id number] [place], [place] - [address] AT [facility] 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   Reports cost of Dexilant is difficult to afford through her Medicare prescription coverage  Asthma:  Current medications: - albuterol nebulizer solution - 1 vial via nebulizer every 6 hours as needed for wheezing/shortness of breath - albuterol HFA inhaler - 1 to 2 puffs every 4  hours as needed for wheezing or shortness of breath  Reports previously use Advair as maintenance inhaler, but stopped when she was unable to afford it; note did not tolerate Wixela brand well in the past  Current medication access support: none   Objective:  Lab Results  Component Value Date   HGBA1C 6.0 (A) 04/29/2022    Lab Results  Component Value Date   CREATININE 0.98 10/20/2023   BUN 10 10/20/2023   NA 137 10/20/2023   K 3.7 10/20/2023   CL 112 (H) 10/20/2023   CO2 16 (L) 10/20/2023    Lab Results  Component Value Date   CHOL 166 08/26/2021   HDL 61.30 08/26/2021   LDLCALC 91 08/26/2021   TRIG 66.0 08/26/2021   CHOLHDL 3 08/26/2021    Medications Reviewed Today     Reviewed by Manuela Neptune, RPH-CPP (Pharmacist) on 11/05/23 at 0940  Med List Status: <None>   Medication Order Taking? Sig Documenting Provider Last Dose Status Informant  albuterol (PROVENTIL) (2.5 MG/3ML) 0.083% nebulizer solution [id number] Yes Take 3 mLs (2.5 mg total) by nebulization every 6 (six) hours as needed for wheezing or shortness of breath. McLean-Scocuzza, Pasty Spillers, MD Taking Active   albuterol (VENTOLIN HFA) 108 (90 Base) MCG/ACT inhaler [id number] Yes INHALE 1 TO 2 PUFFS INTO THE LUNGS EVERY 4 HOURS AS NEEDED FOR WHEEZING OR SHORTNESS OF BREATH Arnett, Lyn Records, FNP Taking Active   bismuth subsalicylate (PEPTO BISMOL) 262 MG chewable tablet [id number] Yes Chew 524 mg by mouth as needed. [provider]  Active   calcium carbonate (TUMS EX) 750 MG chewable tablet [id number] Yes Chew 1 tablet by mouth 3 (three) times daily as needed  for heartburn. [provider] Taking Active   calcium citrate (CALCITRATE - DOSED IN MG ELEMENTAL CALCIUM) 950 (200 Ca) MG tablet [id number] Yes Take 500 mg of elemental calcium by mouth daily. [provider] Taking Active   carbidopa-levodopa (SINEMET CR) 50-200 MG tablet [id number] Yes Take 1 tablet by mouth 3 (three) times daily.  [provider] Taking Active   colestipol (COLESTID) 1 g tablet [id number]  Take 3 tablets (3 g total) by mouth 2 (two) times daily.  Patient taking differently: Take 4 g by mouth 2 (two) times daily.   Remo Lipps, PA  Active   dexlansoprazole (DEXILANT) 60 MG capsule [id number] Yes Take 1 capsule (60 mg total) by mouth daily. Remo Lipps, PA Taking Active   diazepam (VALIUM) 5 MG tablet [id number] Yes Take 2.5 mg by mouth 2 (two) times daily as needed for anxiety. [provider] Taking Active   diclofenac sodium (VOLTAREN) 1 % GEL [id number] Yes Apply 2 g topically 4 (four) times daily as needed (for knee & finger pain.).  [provider] Taking Active Self  dicyclomine (BENTYL) 20 MG tablet [id number] Yes Take 1 tablet (20 mg total) by mouth every 6 (six) hours.  Patient taking differently: Take 20 mg by mouth every 6 (six) hours. Pt is taking 3 times a daily   Allegra Grana, FNP Taking Active            Med Note Ronney Asters, Shay Bartoli A   Fri Nov 05, 2023  9:16 AM) Taking three times daily as needed for abdominal pain/cramping  DULoxetine (CYMBALTA) 60 MG capsule [id number] Yes Take 1 capsule (60 mg total) by mouth 2 (two) times daily. Allegra Grana, FNP Taking Active   EPINEPHrine 0.3 mg/0.3 mL IJ SOAJ injection [id number]  Inject 0.3 mg into the muscle as needed for anaphylaxis. McLean-Scocuzza, Pasty Spillers, MD  Active   HYDROcodone-acetaminophen (NORCO/VICODIN) 5-325 MG tablet [id number] Yes Take 1 tablet by mouth 2 (two) times daily as needed. Take 2x"s daily [provider] Taking Active   hydroxychloroquine (PLAQUENIL) 200 MG tablet [id number] Yes Plaquenil 200 mg tablet  Take 1 tablet twice a day by oral route after meals for 90 days. [provider] Taking Active   lactase (LACTAID) 3000 units tablet [id number] Yes Take 3,000 Units by mouth 3 (three) times daily with meals. [provider] Taking Active   latanoprost (XALATAN)  0.005 % ophthalmic solution [id number] Yes 1 drop at bedtime. [provider] Taking Active   levothyroxine (SYNTHROID) 75 MCG tablet [id number] Yes TAKE 1 TABLET BY MOUTH DAILY 30 MINUTES BEFORE BREAKFAST Remo Lipps, PA Taking Active   montelukast (SINGULAIR) 10 MG tablet [id number] Yes TAKE 1 TABLET BY MOUTH EVERY NIGHT AT BEDTIME Remo Lipps, PA Taking Active   ondansetron (ZOFRAN-ODT) 8 MG disintegrating tablet [id number] Yes TAKE 1 TABLET BY MOUTH EVERY 8 HOURS AS NEEDED FOR NAUSEA OR Alison Murray, PA Taking Active   PAXIL 20 MG tablet [id number] Yes Take 20 mg by mouth daily. [provider] Taking Active   Polyvinyl Alcohol-Povidone (REFRESH OP) [id number] Yes Place 1 drop into both eyes 2 (two) times daily. [provider] Taking Active Self  topiramate (TOPAMAX) 50 MG tablet [id number] Yes Take 1 tablet (50 mg total) by mouth 3 (three) times daily.  Patient taking differently: Take 150 mg by mouth 2 (two) times daily.   Remo Lipps, PA Taking Active   triamcinolone ointment (KENALOG) 0.5 %  [id number] No Apply 1 Application topically 2 (two) times daily.  Patient not taking: Reported on 11/05/2023   Allegra Grana, FNP Not Taking Active   Zoledronic Acid (RECLAST IV) [id number]   [provider]  Active               Assessment/Plan:   Encourage patient to consider starting to use a weekly pillbox  Encourage patient to consider using a pharmacy that offers delivery to reduce burden of getting back and forth to the pharmacy  Comprehensive medication review performed; medication list updated in electronic medical record - Caution patient for risk of dizziness/sedation with hydrocodone, diazepam and topiramate, particularly when taken in combination  Patient verbalizes understanding. States takes hydrocodone and diazepam only as needed  Denies dizziness or sleepiness with hydrocodone. Denies taking if driving    Encourage patient to follow up with [facility]  Refer patient to/collaborate with LCSW regarding need for support with social support, financial constraints and support with connecting to a counselor - Appointment with LCSW scheduled for 11/08/2023 at 3:00 PM    From review of health plan information for [facility] website, find that neither Dexilant or its generic form are covered through patient's formulary - Patient reports that she has been paying for this medication out of pocket  Submit a tier exception request for dexlansoprazole for patient to her insurance via covermymeds today  Will collaborate with provider and CPhT to aid patient with applying for patient assistance for Dexilant from [place]  Asthma: - Patient not currently on Advair maintenance inhaler due to cost. From review of health plan information for [employer] website, appears generic Advair DPI is a tier 3 option for patient and patient has $45/month copayment for tier 3 medications (however, note has $590 annual deductible for tiers 3,4 and 5) Collaborate with PCP to ask provider to consider sending new prescription for Advair inhaler to pharmacy for patient. Follow up with [facility] pharmacy. Confirm patient's copayment is $45/month supply Request pharmacy order non-Wixela generic for patient - Meets financial criteria for [employer] and Patient [employer] - Asthma fund grants, but note that this grant is currently closed. Recommend patient apply for waitlist/alerts for thes grant funds. Will send patient this information via MyChart    Follow Up Plan: Clinical Pharmacist will follow up with patient by telephone on 12/10/2023 at 9:15 AM   Estelle Grumbles, PharmD, [facility] [phone]

## 2023-11-05 NOTE — Progress Notes (Unsigned)
Good Morning!   During my discussion with patient this morning, found that she has had difficulty with affording 2 of her medications.   We will assist patient with applying for patient assistance for Dexilant from [employer].  Regarding her asthma, patient reports that she was previously on Advair, but stopped using this maintenance inhaler as cost became unaffordable. Would you mind sending a Rx for this inhaler through to patient's [facility] so that I can look into this further for her?  From review of chart, appears previous Rx was written for:   "fluticasone-salmeterol (ADVAIR DISKUS) 250-50 MCG/ACT AEPB Inhale 1 puff into the lungs in the morning and at bedtime. Rinse mouth did not tolerate wixela brand"  Thank you!  Estelle Grumbles, PharmD, [facility] [phone]

## 2023-11-08 ENCOUNTER — Ambulatory Visit: Payer: Self-pay | Admitting: *Deleted

## 2023-11-08 ENCOUNTER — Encounter: Payer: Self-pay | Admitting: *Deleted

## 2023-11-08 NOTE — Patient Instructions (Signed)
Goals Addressed             This Visit's Progress    Pharmacy Goals       Please watch the mail for an envelope from [facility] containing the patient assistance program application. Please complete this application and bring to office to have it faxed back to Attention: Linward Foster at Fax # [phone] along with a copy of your Medicare Part D prescription card and a copy of your proof of income document.   If you need to call Joni Reining, you can reach her at [phone].   Also, for assistance with the cost of your generic Advair inhaler, there are a couple of foundation grants available to help with these copayments, including the [employer] Foundation and Patient [employer]. Both of these funds are currently closed (meaning not enrolling patients right now), but these can open for new applications throughout the year.    Please consider signing up for the waitlist for the PAN Foundation fund at:    https://www.panfoundation.org/disease-funds/asthma/     You can sign up for alerts from the Patient [employer]ocate Foundation at:   https://copays.org/funds/asthma/     I look forward to speaking with you again on 12/10/2023 at 9:15 AM    Thank you!   Estelle Grumbles, PharmD, [facility] [phone]

## 2023-11-09 ENCOUNTER — Other Ambulatory Visit: Payer: Self-pay

## 2023-11-09 ENCOUNTER — Encounter: Payer: Self-pay | Admitting: Physician Assistant

## 2023-11-09 ENCOUNTER — Emergency Department
Admission: EM | Admit: 2023-11-09 | Discharge: 2023-11-09 | Disposition: A | Discharge status: Home / Self Care | Payer: Medicare Other | Attending: Emergency Medicine | Admitting: Emergency Medicine

## 2023-11-09 ENCOUNTER — Ambulatory Visit: Payer: Medicare Other | Admitting: Physician Assistant

## 2023-11-09 ENCOUNTER — Telehealth: Payer: Self-pay

## 2023-11-09 ENCOUNTER — Emergency Department: Payer: Medicare Other

## 2023-11-09 ENCOUNTER — Telehealth: Payer: Self-pay | Admitting: Physician Assistant

## 2023-11-09 ENCOUNTER — Telehealth (INDEPENDENT_AMBULATORY_CARE_PROVIDER_SITE_OTHER): Payer: Medicare Other | Admitting: Physician Assistant

## 2023-11-09 ENCOUNTER — Ambulatory Visit: Payer: Self-pay | Admitting: *Deleted

## 2023-11-09 VITALS — Ht 70.0 in

## 2023-11-09 DIAGNOSIS — G20C Parkinsonism, unspecified: Secondary | ICD-10-CM | POA: Diagnosis not present

## 2023-11-09 DIAGNOSIS — R197 Diarrhea, unspecified: Secondary | ICD-10-CM | POA: Diagnosis not present

## 2023-11-09 DIAGNOSIS — R0602 Shortness of breath: Secondary | ICD-10-CM | POA: Insufficient documentation

## 2023-11-09 DIAGNOSIS — K580 Irritable bowel syndrome with diarrhea: Secondary | ICD-10-CM

## 2023-11-09 DIAGNOSIS — R112 Nausea with vomiting, unspecified: Secondary | ICD-10-CM

## 2023-11-09 DIAGNOSIS — R55 Syncope and collapse: Secondary | ICD-10-CM | POA: Insufficient documentation

## 2023-11-09 DIAGNOSIS — R064 Hyperventilation: Secondary | ICD-10-CM | POA: Diagnosis not present

## 2023-11-09 DIAGNOSIS — R002 Palpitations: Secondary | ICD-10-CM | POA: Insufficient documentation

## 2023-11-09 DIAGNOSIS — R42 Dizziness and giddiness: Secondary | ICD-10-CM | POA: Insufficient documentation

## 2023-11-09 DIAGNOSIS — R0789 Other chest pain: Secondary | ICD-10-CM | POA: Diagnosis not present

## 2023-11-09 DIAGNOSIS — R531 Weakness: Secondary | ICD-10-CM | POA: Diagnosis not present

## 2023-11-09 DIAGNOSIS — Z7401 Bed confinement status: Secondary | ICD-10-CM | POA: Diagnosis not present

## 2023-11-09 LAB — COMPREHENSIVE METABOLIC PANEL
ALT: 24 U/L (ref 0–44)
AST: 21 U/L (ref 15–41)
Albumin: 4.3 g/dL (ref 3.5–5.0)
Alkaline Phosphatase: 66 U/L (ref 38–126)
Anion gap: 10 (ref 5–15)
BUN: 6 mg/dL (ref 6–20)
CO2: 27 mmol/L (ref 22–32)
Calcium: 9.3 mg/dL (ref 8.9–10.3)
Chloride: 105 mmol/L (ref 98–111)
Creatinine, Ser: 0.9 mg/dL (ref 0.44–1.00)
GFR, Estimated: >60 mL/min (ref >60)
Glucose, Bld: 93 mg/dL (ref 70–99)
Potassium: 4 mmol/L (ref 3.5–5.1)
Sodium: 142 mmol/L (ref 135–145)
Total Bilirubin: 1.1 mg/dL (ref <1.2)
Total Protein: 6.9 g/dL (ref 6.5–8.1)

## 2023-11-09 LAB — CBC WITH DIFFERENTIAL/PLATELET
Abs Immature Granulocytes: 0.01 10*3/uL (ref 0.00–0.07)
Basophils Absolute: 0 10*3/uL (ref 0.0–0.1)
Basophils Relative: 0 %
Eosinophils Absolute: 0.1 10*3/uL (ref 0.0–0.5)
Eosinophils Relative: 1 %
HCT: 46.9 % — ABNORMAL HIGH (ref 36.0–46.0)
Hemoglobin: 16 g/dL — ABNORMAL HIGH (ref 12.0–15.0)
Immature Granulocytes: 0 %
Lymphocytes Relative: 28 %
Lymphs Abs: 1.6 10*3/uL (ref 0.7–4.0)
MCH: 28.6 pg (ref 26.0–34.0)
MCHC: 34.1 g/dL (ref 30.0–36.0)
MCV: 83.9 fL (ref 80.0–100.0)
Monocytes Absolute: 0.3 10*3/uL (ref 0.1–1.0)
Monocytes Relative: 6 %
Neutro Abs: 3.5 10*3/uL (ref 1.7–7.7)
Neutrophils Relative %: 65 %
Platelets: 149 10*3/uL — ABNORMAL LOW (ref 150–400)
RBC: 5.59 MIL/uL — ABNORMAL HIGH (ref 3.87–5.11)
RDW: 13.2 % (ref 11.5–15.5)
WBC: 5.5 10*3/uL (ref 4.0–10.5)
nRBC: 0 % (ref 0.0–0.2)

## 2023-11-09 LAB — MAGNESIUM: Magnesium: 1.6 mg/dL — ABNORMAL LOW (ref 1.7–2.4)

## 2023-11-09 LAB — TSH: TSH: 1.83 u[IU]/mL (ref 0.350–4.500)

## 2023-11-09 LAB — TROPONIN I (HIGH SENSITIVITY)
Troponin I (High Sensitivity): <2 ng/L (ref <18)
Troponin I (High Sensitivity): <2 ng/L (ref <18)

## 2023-11-09 LAB — LIPASE, BLOOD: Lipase: 25 U/L (ref 11–51)

## 2023-11-09 LAB — T4, FREE: Free T4: 1.1 ng/dL (ref 0.61–1.12)

## 2023-11-09 MED ORDER — METOCLOPRAMIDE HCL 5 MG PO TABS: 5.0000 mg | ORAL_TABLET | Freq: Four times a day (QID) | ORAL | 0 refills | Status: DC | PRN

## 2023-11-09 MED ORDER — RIFAXIMIN 550 MG PO TABS
550.0000 mg | ORAL_TABLET | Freq: Three times a day (TID) | ORAL | 0 refills | Status: AC
Start: 2023-11-09 — End: 2023-11-23

## 2023-11-09 NOTE — ED Notes (Signed)
First nurse note: Pt here via [facility] from home with multiple complaints that have been ongoing for years but getting worse over the past few months. EMS states pt not compliant.   134/98 HR:87 99%  CBG 118

## 2023-11-09 NOTE — ED Notes (Signed)
Lab called to request for them to come obtain lab redraw.

## 2023-11-09 NOTE — Telephone Encounter (Signed)
Reason for Disposition  [1] All other patients AND [2] now alert and feels fine  (Exception: SIMPLE FAINT due to stress, pain, prolonged standing, or suddenly standing)  Answer Assessment - Initial Assessment Questions 1. ONSET: "How long were you unconscious?" (minutes) "When did it happen?"     Patient fell into counter-lost balance, near fainting 2. CONTENT: "What happened during period of unconsciousness?" (e.g., seizure activity)      Yesterday also had problems- patient states she ends up on the floor 3. MENTAL STATUS: "Alert and oriented now?" (oriented x 3 = name, month, location)      Alert/consciousness 4. TRIGGER: "What do you think caused the fainting?" "What were you doing just before you fainted?"  (e.g., exercise, sudden standing up, prolonged standing)     Unsure- patient has had multiple symptoms- getting worse- with multiple vivists to the ED.  5. RECURRENT SYMPTOM: "Have you ever passed out before?" If Yes, ask: "When was the last time?" and "What happened that time?"      Chronic- 11/2 years 6. INJURY: "Did you sustain any injury during the fall?"      no 7. CARDIAC SYMPTOMS: "Have you had any of the following symptoms: chest pain, difficulty breathing, palpitations?"     Difficulty breathing 8. NEUROLOGIC SYMPTOMS: "Have you had any of the following symptoms: headache, numbness, vertigo, weakness?"     Weakness,headache 9. GI SYMPTOMS: "Have you had any of the following symptoms: abdomen pain, vomiting, diarrhea, blood in stools?"     Diarrhea, abdominal pain 10. OTHER SYMPTOMS: "Do you have any other symptoms?"       Diarrhea, nausea, chest pain, SOB- started 2 months ago  Protocols used: Fainting-A-AH

## 2023-11-09 NOTE — Telephone Encounter (Signed)
PA completed waiting on insurance approval.  Key: [id number]  [id number]

## 2023-11-09 NOTE — Discharge Instructions (Addendum)
Fortunately your testing in the emergency department did not show any emergency conditions that would account for your symptoms today.  Thank you for choosing [place] for your health care today!  Please see your primary doctor this week for a follow up appointment.   If you have any new, worsening, or unexpected symptoms call your doctor right away or come back to the emergency department for reevaluation.  It was my pleasure to care for you today.   Daneil Dan Modesto Charon, MD

## 2023-11-09 NOTE — ED Provider Notes (Signed)
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  She was feeding her puppy when she developed lightheadedness, palpitations, shortness of breath and did not fully lose consciousness and lowered herself to the ground without sustaining any trauma.  Another episode happened later in the day.  She denies chest pain.  She denies ongoing symptoms aside from mild dizziness.  She appears very anxious.  She specifically denies cough, fever, or any acute abdominal pain or urinary symptoms.  She denies drug or alcohol use.  She expresses frustration with her recent medical visits including with GI, previously with neurology, cardiology for her multitude of symptoms with no definitive diagnosis thus far.     External Medical Documents Reviewed: Extensive messaging between this patient and Dr. Raymon Mutton earlier this month documenting her multitude of symptoms including dizziness, abdominal pain nausea and vomiting, headache, loss of appetite, palpitations, shortness of breath "basically the same things have been complaining about but I do feel worse"      Physical Exam   Triage Vital Signs: ED Triage Vitals  Encounter Vitals Group     BP 11/09/23 1032 (!) 127/95     Systolic BP Percentile --      Diastolic BP Percentile --      Pulse Rate 11/09/23 1032 83     Resp 11/09/23 1032 18     Temp 11/09/23 1032 99.4 F (37.4 C)     Temp Source 11/09/23 1032 Oral     SpO2 11/09/23 1032 98 %     Weight 11/09/23 1031 172 lb 2.9 oz (78.1 kg)      Height 11/09/23 1031 5\' 10"  (1.778 m)     Head Circumference --      Peak Flow --      Pain Score 11/09/23 1032 10     Pain Loc --      Pain Education --      Exclude from Growth Chart --     Most recent vital signs: Vitals:   11/09/23 1032  BP: (!) 127/95  Pulse: 83  Resp: 18  Temp: 99.4 F (37.4 C)  SpO2: 98%    General: Awake, no distress.  CV:  Good peripheral perfusion.  Resp:  Normal effort.  Abd:  No distention.  Other:  Very anxious appearing.  Fine tremor throughout.  Moving all extremities with full active range of motion, sensory intact.  No facial asymmetry.  No dysarthria.  Abdomen soft nontender, heart sounds are normal without normal rate and rhythm no murmurs, clear lungs.  No obvious signs of trauma to the head, neck torso back or abdomen or extremities.   ED Results / Procedures / Treatments   Labs (all labs ordered are listed, but only abnormal results are displayed) Labs Reviewed  CBC WITH DIFFERENTIAL/PLATELET - Abnormal; Notable for the following components:      Result Value   RBC 5.59 (*)    Hemoglobin 16.0 (*)    HCT 46.9 (*)    Platelets 149 (*)    All other components within  normal limits  COMPREHENSIVE METABOLIC PANEL  LIPASE, BLOOD  TSH  MAGNESIUM  T4, FREE  CALCIUM, IONIZED  TROPONIN I (HIGH SENSITIVITY)  TROPONIN I (HIGH SENSITIVITY)     I ordered and reviewed the above labs they are notable for cell counts unremarkable initial trop is less than 2  EKG  ED ECG REPORT I, Pilar Jarvis, the attending physician, personally viewed and interpreted this ECG.   Date: 11/09/2023  EKG Time: 1039  Rate: 82  Rhythm: sinus  Axis: nl  Intervals:none  ST&T Change: no stemi   PROCEDURES:  Critical Care performed: No  Procedures   MEDICATIONS ORDERED IN ED: Medications - No data to display  IMPRESSION / MDM / ASSESSMENT AND PLAN / ED COURSE  I reviewed the triage vital signs and the nursing notes.                                 Patient's presentation is most consistent with acute presentation with potential threat to life or bodily function.  Differential diagnosis includes, but is not limited to, neurocognitive/neuro muscular disorder, dysautonomia related to Parkinson's disease, ACS, dysrhythmia, electrolyte disturbance, considered less likely stroke   The patient is on the cardiac monitor to evaluate for evidence of arrhythmia and/or significant heart rate changes.  MDM:    Multitude of symptoms that are longstanding and unchanged, patient reports worsening, and had a syncopal episode earlier today.  I am unsure what is causing her symptoms.  We will check electrolytes, troponins, thyroid and correct if any abnormalities.  However given the chronicity of her symptoms I doubt there is an acute emergent life-threatening process happening at this time.  I considered stroke but she has no focal neurologic deficits given her longstanding history of similar symptoms with an MRI brain done earlier this year, I am not inclined to repeat this test today.  She does have a neurology appointment upcoming which I reinforced the importance of keeping this appointment.  Ultimately if our lab testing today reveals no marked abnormalities plan will be for discharge at this time with close follow-up with neurology and PMD         FINAL CLINICAL IMPRESSION(S) / ED DIAGNOSES   Final diagnoses:  Near syncope     Rx / DC Orders   ED Discharge Orders     None        Note:  This document was prepared using Dragon voice recognition software and may include unintentional dictation errors.    Pilar Jarvis, MD 11/09/23 [phone]

## 2023-11-09 NOTE — ED Notes (Signed)
This RN in room with PA-C to evaluate patient. Patient with multiple chronic complaints, including dizziness and nausea/vomiting and increase in falls. Patient also complaining of diarrhea for quite some time.   Pt does not wish to have head CT as she did not hit her head and she just had a brain MRI in August.

## 2023-11-09 NOTE — ED Triage Notes (Signed)
Pt here with a fall, dizziness, palpitations, and SOB. Pt states she fell into the counter in her kitchen. Pt endorses a headache, vomiting, and abd pain. Pt called her provider who advised her to come to the ED.

## 2023-11-09 NOTE — ED Notes (Signed)
Lab called to state lt green tube was a  short sample, lab drew this pt, lab asked to come redraw this pt.

## 2023-11-09 NOTE — Patient Instructions (Signed)
Visit Information  Thank you for taking time to visit with me today. Please don't hesitate to contact me if I can be of assistance to you.   Following are the goals we discussed today:   Goals Addressed             This Visit's Progress    referral to a mental health therapist       Activities and task to complete in order to accomplish goals.   EMOTIONAL / MENTAL HEALTH SUPPORT Self Support options  (referral to be made for ongoing mental health therapy, continue follow up with psychiatrist, through [facility], CSW to continue to assist with community resource needs at supports )         Our next appointment is by telephone on 11/18/23 at 2pm  Please call the care guide team at [phone] if you need to cancel or reschedule your appointment.   If you are experiencing a Mental Health or Behavioral Health Crisis or need someone to talk to, please call 911   Patient verbalizes understanding of instructions and care plan provided today and agrees to view in MyChart. Active MyChart status and patient understanding of how to access instructions and care plan via MyChart confirmed with patient.     Telephone follow up appointment with care management team member scheduled for:  11/18/23   Verna Czech, LCSW [place]  [facility], [facility] Licensed Clinical Social Worker Care Coordinator  Direct Dial: [phone]

## 2023-11-09 NOTE — Progress Notes (Signed)
Date:  11/09/2023   Name:  [name] Putt, MD   DOB:  1965/12/26   MRN:  [id number]   I connected with [name] [name] on 11/09/23 via MyChart Video and verified that I am speaking with the correct person using appropriate identifiers. The limitations, risks, security and privacy concerns of performing an evaluation and management service by MyChart Video, including the higher likelihood of inaccurate diagnoses and treatments, and the availability of in person appointments were reviewed. The possible need of an additional face-to-face encounter for complete and high quality delivery of care was discussed. The patient was also made aware that there may be a patient responsible charge related to this service. The patient expressed understanding and wishes to proceed.   Provider location is in medical facility [facility] at [facility]). Patient location is at their home People involved in care of the patient during this telehealth encounter were myself, my CMA, and my front office/scheduling team member.    Chief Complaint: Nausea and Emesis  HPI [name] presents virtually today for her same chronic symptoms of fatigue, dizziness, physical weakness, nausea, vomiting, diarrhea.  Nutrition mainly consists of Carnation breakfast blend and rice; she says she really cannot tolerate anything else including soft fruits/vegetables, yogurt, etc.  She recently had her first visit with [facility] and unfortunately was not impressed.  She saw an NP there who wanted to repeat some of the testing she has already done including H. pylori stool antigen.  Patient states the provider would not order the breath test requested for SIBO.  She has another appointment in March to see their physician Dr. Jennye Boroughs.  Sees neurology in January, thinks she might have a vagus nerve disorder.   Continues to voice concern that something is wrong with her body  despite negative workup thus far.   Regarding the vomiting, ondansetron is not helping and she has allergies/sensitivities to several other antiemetics.  She thinks she has tried metoclopramide at some point in the past, not sure how well she tolerated it.  Regarding the working diagnosis of IBS, Bentyl seems to be inadequate.  She has never tried rifaximin.  Working with [facility] pharmacist regarding her polypharmacy.   Medication list has been reviewed and updated.  Current Meds  Medication Sig   albuterol (PROVENTIL) (2.5 MG/3ML) 0.083% nebulizer solution Take 3 mLs (2.5 mg total) by nebulization every 6 (six) hours as needed for wheezing or shortness of breath.   albuterol (VENTOLIN HFA) 108 (90 Base) MCG/ACT inhaler INHALE 1 TO 2 PUFFS INTO THE LUNGS EVERY 4 HOURS AS NEEDED FOR WHEEZING OR SHORTNESS OF BREATH   bismuth subsalicylate (PEPTO BISMOL) 262 MG chewable tablet Chew 524 mg by mouth as needed.   calcium carbonate (TUMS EX) 750 MG chewable tablet Chew 1 tablet by mouth 3 (three) times daily as needed for heartburn.   calcium citrate (CALCITRATE - DOSED IN MG ELEMENTAL CALCIUM) 950 (200 Ca) MG tablet Take 500 mg of elemental calcium by mouth daily.   carbidopa-levodopa (SINEMET CR) 50-200 MG tablet Take 1 tablet by mouth 3 (three) times daily.   colestipol (COLESTID) 1 g tablet Take 3 tablets (3 g total) by mouth 2 (two) times daily. (Patient taking differently: Take 4 g by mouth 2 (two) times daily.)   dexlansoprazole (DEXILANT) 60 MG capsule Take 1 capsule (60 mg total) by mouth daily.   diazepam (VALIUM) 5 MG tablet Take 2.5 mg by mouth 2 (  two) times daily as needed for anxiety.   diclofenac sodium (VOLTAREN) 1 % GEL Apply 2 g topically 4 (four) times daily as needed (for knee & finger pain.).    dicyclomine (BENTYL) 20 MG tablet Take 1 tablet (20 mg total) by mouth every 6 (six) hours. (Patient taking differently: Take 20 mg by mouth every 6 (six) hours. Pt is taking 3 times a  daily)   DULoxetine (CYMBALTA) 60 MG capsule Take 1 capsule (60 mg total) by mouth 2 (two) times daily.   EPINEPHrine 0.3 mg/0.3 mL IJ SOAJ injection Inject 0.3 mg into the muscle as needed for anaphylaxis.   fluticasone-salmeterol (ADVAIR DISKUS) 250-50 MCG/ACT AEPB Inhale 1 puff into the lungs in the morning and at bedtime. Rinse mouth after use   HYDROcodone-acetaminophen (NORCO/VICODIN) 5-325 MG tablet Take 1 tablet by mouth 2 (two) times daily as needed. Take 2x"s daily   hydroxychloroquine (PLAQUENIL) 200 MG tablet Plaquenil 200 mg tablet  Take 1 tablet twice a day by oral route after meals for 90 days.   lactase (LACTAID) 3000 units tablet Take 3,000 Units by mouth 3 (three) times daily with meals.   latanoprost (XALATAN) 0.005 % ophthalmic solution 1 drop at bedtime.   levothyroxine (SYNTHROID) 75 MCG tablet TAKE 1 TABLET BY MOUTH DAILY 30 MINUTES BEFORE BREAKFAST   metoCLOPramide (REGLAN) 5 MG tablet Take 1 tablet (5 mg total) by mouth every 6 (six) hours as needed for nausea.   montelukast (SINGULAIR) 10 MG tablet TAKE 1 TABLET BY MOUTH EVERY NIGHT AT BEDTIME   ondansetron (ZOFRAN-ODT) 8 MG disintegrating tablet TAKE 1 TABLET BY MOUTH EVERY 8 HOURS AS NEEDED FOR NAUSEA OR VOMTING   PAXIL 20 MG tablet Take 20 mg by mouth daily.   Polyvinyl Alcohol-Povidone (REFRESH OP) Place 1 drop into both eyes 2 (two) times daily.   rifaximin (XIFAXAN) 550 MG TABS tablet Take 1 tablet (550 mg total) by mouth 3 (three) times daily for 14 days.   sucralfate (CARAFATE) 1 g tablet Take 1 g by mouth 4 (four) times daily.   topiramate (TOPAMAX) 50 MG tablet Take 3 tablets (150 mg total) by mouth 2 (two) times daily.   traZODone (DESYREL) 50 MG tablet SMARTSIG:1.0 Tablet(s) By Mouth Every Night   triamcinolone ointment (KENALOG) 0.5 % Apply 1 Application topically 2 (two) times daily.   Zoledronic Acid (RECLAST IV)      Review of Systems  Patient Active Problem List   Diagnosis Date Noted    Polypharmacy 09/02/2023   Severe diarrhea 09/22/2022   Chronic low back pain 07/23/2022   Lumbar facet arthropathy 07/23/2022   Cyst of right kidney 07/23/2022   Prediabetes 11/06/2021   Deformity of both feet 06/05/2020   CTS (carpal tunnel syndrome) 11/17/2019   Vitamin D deficiency 07/04/2019   Chronic venous insufficiency 04/18/2019   Parkinson disease (HCC) 04/03/2019   Connective tissue disorder (HCC) 02/14/2019   Hiatal hernia 10/06/2018   ADHD 08/04/2018   Lupus arthritis (HCC) 08/04/2018   Asthma 08/04/2018   Systemic lupus erythematosus (HCC) 08/04/2018   Urinary incontinence 08/04/2018   Glaucoma 08/04/2018   Migraines 08/04/2018   Mitral valve regurgitation 08/04/2018   Neuropathy 08/04/2018   OCD (obsessive compulsive disorder) 08/04/2018   Osteopenia 08/04/2018   Thrombocytopenia (HCC) 07/06/2018   Allergic rhinitis 12/16/2017   Chronic constipation 12/16/2017   Fibromyalgia 11/04/2017   Sicca syndrome (HCC) 06/04/2014   Chronic pain 03/31/2011   Overactive bladder 10/14/2009   Optic neuritis 11/08/2007  Polyarthropathy or polyarthritis of multiple sites 08/19/2007   Hypothyroidism 06/06/2007   Anxiety and depression 06/06/2007   Depression, recurrent (HCC) 06/06/2007   GERD 06/06/2007    Allergies  Allergen Reactions   Bee Venom Anaphylaxis, Itching and Swelling    Affected area Affected area Affected area Affected area    Fish Allergy Hives, Swelling and Rash    Facial swelling  Facial swelling  Facial swelling Facial swelling  Facial swelling    Latex Anaphylaxis, Rash and Shortness Of Breath    Rash  Rash     Peanut (Diagnostic) Hives   Peanut-Containing Drug Products Anaphylaxis   Prasterone Other (See Comments) and Nausea And Vomiting    rash Other reaction(s): Headache Headaches.   Shellfish Allergy Hives, Other (See Comments), Rash and Swelling    Facial swelling Uncoded Allergy. Allergen: seafood Uncoded Allergy. Allergen:  CATS, Other Reaction: itch, wheezing Uncoded Allergy. Allergen: COMPAZINE, Other Reaction: tremors Facial swelling Facial swelling Uncoded Allergy. Allergen: seafood Uncoded Allergy. Allergen: CATS, Other Reaction: itch, wheezing Uncoded Allergy. Allergen: COMPAZINE, Other Reaction: tremors Facial swelling Uncoded Allergy. Allergen: seafood Uncoded Allergy. Allergen: CATS, Other Reaction: itch, wheezing Uncoded Allergy. Allergen: COMPAZINE, Other Reaction: tremors Facial swelling Facial swelling Uncoded Allergy. Allergen: seafood Uncoded Allergy. Allergen: CATS, Other Reaction: itch, wheezing Uncoded Allergy. Allergen: COMPAZINE, Other Reaction: tremors Facial swelling Facial swelling Uncoded Allergy. Allergen: seafood Uncoded Allergy. Allergen: CATS, Other Reaction: itch, wheezing Uncoded Allergy. Allergen: COMPAZINE, Other Reaction: tremors    Shellfish-Derived Products Hives, Other (See Comments), Rash and Swelling    Facial swelling Uncoded Allergy. Allergen: seafood Uncoded Allergy. Allergen: CATS, Other Reaction: itch, wheezing Uncoded Allergy. Allergen: COMPAZINE, Other Reaction: tremors Facial swelling Facial swelling Uncoded Allergy. Allergen: seafood Uncoded Allergy. Allergen: CATS, Other Reaction: itch, wheezing Uncoded Allergy. Allergen: COMPAZINE, Other Reaction: tremors Facial swelling Uncoded Allergy. Allergen: seafood Uncoded Allergy. Allergen: CATS, Other Reaction: itch, wheezing Uncoded Allergy. Allergen: COMPAZINE, Other Reaction: tremors Facial swelling   Compazine [Prochlorperazine Edisylate] Other (See Comments)    tremors   Dhea [Nutritional Supplements] Other (See Comments)    Headaches.   Dilaudid [Hydromorphone Hcl]     ? reaction   Famotidine Other (See Comments) and Diarrhea    Other reaction(s): Headache Gas   Gabapentin Other (See Comments)    'snowed out'    Lactose    Lactose Intolerance (Gi)    Other Other (See Comments)    Other  reaction(s): Unknown    Prochlorperazine     Other reaction(s): Other (See Comments) ticks   Promethazine     Other reaction(s): Other (See Comments) Ticks   Promethazine Hcl Other (See Comments)    CNS disorder   Reclast [Zoledronic Acid]     Weakness could not move limbs, fatigue, increase sleep   Clarithromycin Hives and Rash   Clotrimazole Swelling and Rash   Hydromorphone Hcl Rash and Hives    "Rash all over"   Penicillins Rash and Other (See Comments)    Has patient had a PCN reaction causing immediate rash, facial/tongue/throat swelling, SOB or lightheadedness with hypotension:No Has patient had a PCN reaction causing severe rash involving mucus membranes or skin necrosis:No Has patient had a PCN reaction that required hospitalization:No Has patient had a PCN reaction occurring within the last 10 years:No If all of the above answers are "NO", then may proceed with Cephalosporin use.    Immunization History  Administered Date(s) Administered   Influenza Inj Mdck Quad Pf 08/30/2017   Influenza Split 08/13/2011, 08/19/2012, 09/18/2013  Influenza Whole 09/13/2007, 09/12/2008, 08/06/2010   Influenza, Seasonal, Injecte, Preservative Fre 08/09/2023   Influenza,inj,Quad PF,6+ Mos 08/13/2011, 08/19/2012, 09/18/2013, 08/21/2014, 09/18/2014, 09/02/2015, 09/01/2016, 08/30/2017, 08/19/2018, 07/28/2019, 08/06/2020, 08/21/2021, 07/23/2022   Influenza-Unspecified 09/02/2015, 08/30/2017   PFIZER(Purple Top)SARS-COV-2 Vaccination 03/19/2020, 04/09/2020, 11/06/2020   Pfizer Covid-19 Vaccine Bivalent Booster 29yrs & up 04/21/2021, 10/21/2021   Pneumococcal Conjugate-13 11/30/2004, 06/04/2014, 09/03/2014   Pneumococcal Polysaccharide-23 11/30/2004, 09/05/2012, 01/21/2022   Td 11/30/2004   Tdap 09/11/2014, 11/05/2014, 10/19/2016    Past Surgical History:  Procedure Laterality Date   ADENOIDECTOMY     APPENDECTOMY     CERVICAL BIOPSY  W/ LOOP ELECTRODE EXCISION  2005   CHOLECYSTECTOMY      COLONOSCOPY N/A 10/16/2022   Procedure: COLONOSCOPY;  Surgeon: Regis Bill, MD;  Location: [facility] ENDOSCOPY;  Service: Gastroenterology;  Laterality: N/A;   ESOPHAGOGASTRODUODENOSCOPY N/A 10/16/2022   Procedure: ESOPHAGOGASTRODUODENOSCOPY (EGD);  Surgeon: Regis Bill, MD;  Location: [facility] ENDOSCOPY;  Service: Gastroenterology;  Laterality: N/A;   ESOPHAGOGASTRODUODENOSCOPY (EGD) WITH PROPOFOL N/A 08/27/2017   Procedure: ESOPHAGOGASTRODUODENOSCOPY (EGD) WITH PROPOFOL;  Surgeon: Scot Jun, MD;  Location: [facility] ENDOSCOPY;  Service: Endoscopy;  Laterality: N/A;   FRACTURE SURGERY  11/30/2008   HAND SURGERY     repair of left metatarsal    HARDWARE REMOVAL Right 02/16/2017   Procedure: HARDWARE REMOVAL FROM HIP;  Surgeon: Kennedy Bucker, MD;  Location: [facility] ORS;  Service: Orthopedics;  Laterality: Right;   JOINT REPLACEMENT     OTHER SURGICAL HISTORY     left 3rd metatarsal fracture repair 2011    REVISION TOTAL HIP ARTHROPLASTY  06/2009   replacement 2010 then screw and plate removal 7829; right hip   TONSILLECTOMY     TONSILLECTOMY     wrist  ligament repair bilateral     WRIST RECONSTRUCTION     WRIST SURGERY     laceration of right wrist ligaments   wrist surgery     laceration of left wrist surgery     Social History   Tobacco Use   Smoking status: Never   Smokeless tobacco: Never  Vaping Use   Vaping status: Never Used  Substance Use Topics   Alcohol use: No   Drug use: No    Family History  Problem Relation Age of Onset   Hypertension Mother    Arthritis Mother    Heart disease Mother        afib   Asthma Mother    Asthma Sister    Asthma Daughter    Arthritis Daughter    Heart disease Daughter        ?heart condition on BB   ADD / ADHD Daughter    Depression Daughter    Intellectual disability Daughter    Learning disabilities Daughter    Pancreatic cancer Father    Cancer Father        pancreatitic    Diabetes Maternal Grandmother     Heart disease Maternal Grandmother    Arthritis Maternal Grandmother    Hypertension Maternal Grandmother    Dementia Maternal Grandmother    Colon cancer Maternal Uncle 45   Crohn's disease Maternal Aunt    Breast cancer Maternal Aunt 34   Diabetes Maternal Aunt    Breast cancer Cousin 21       maternal   Cancer Cousin        m cousin breat cancer s/p removal both breasts         11/09/2023    8:21 AM  09/15/2023   10:32 AM 09/02/2023    1:39 PM 08/09/2023    8:39 AM  GAD 7 : Generalized Anxiety Score  Nervous, Anxious, on Edge 2 2 1 2   Control/stop worrying 2 2 1 3   Worry too much - different things 2 2 1 3   Trouble relaxing 2 2 1 3   Restless 0 0 0 1  Easily annoyed or irritable 0 0 0 1  Afraid - awful might happen 2 2 1 2   Total GAD 7 Score 10 10 5 15   Anxiety Difficulty Somewhat difficult Somewhat difficult Somewhat difficult Extremely difficult       11/09/2023    8:21 AM 09/15/2023   10:31 AM 09/02/2023    1:39 PM  Depression screen PHQ 2/9  Decreased Interest 0 0 0  Down, Depressed, Hopeless 2 2 1   PHQ - 2 Score 2 2 1   Altered sleeping 2 2 2   Tired, decreased energy 2 2 2   Change in appetite 2 2 2   Feeling bad or failure about yourself  2 2 0  Trouble concentrating 0 0 0  Moving slowly or fidgety/restless 0 0 0  Suicidal thoughts 0 0 0  PHQ-9 Score 10 10 7   Difficult doing work/chores Somewhat difficult Somewhat difficult Somewhat difficult    BP Readings from Last 3 Encounters:  11/09/23 (!) 127/95  11/01/23 120/80  10/20/23 (!) 129/95    Wt Readings from Last 3 Encounters:  11/09/23 172 lb 2.9 oz (78.1 kg)  11/01/23 172 lb 4 oz (78.1 kg)  10/20/23 168 lb (76.2 kg)    Ht 5\' 10"  (1.778 m)   LMP 12/15/2015 Comment: neg urine test   BMI 24.72 kg/m   Physical Exam General: Tearful, in bed during the visit. Speaking full sentences, no audible heavy breathing. Sounds alert and appropriately interactive. Face symmetric. Extraocular movements intact.  Pupils equal and round. No nasal flaring or accessory muscle use visualized.  Recent Labs     Component Value Date/Time   NA 137 10/20/2023 1641   NA 141 09/02/2023 1443   NA 141 09/25/2014 0015   K 3.7 10/20/2023 1641   K 4.2 09/25/2014 0015   CL 112 (H) 10/20/2023 1641   CL 110 (H) 09/25/2014 0015   CO2 16 (L) 10/20/2023 1641   CO2 24 09/25/2014 0015   GLUCOSE 91 10/20/2023 1641   GLUCOSE 121 (H) 09/25/2014 0015   BUN 10 10/20/2023 1641   BUN 13 09/02/2023 1443   BUN 21 (H) 09/25/2014 0015   CREATININE 0.98 10/20/2023 1641   CREATININE 0.93 09/25/2014 0015   CALCIUM 8.6 (L) 10/20/2023 1641   CALCIUM 8.4 (L) 09/25/2014 0015   PROT 7.1 09/22/2023 0833   PROT 7.1 09/02/2023 1443   PROT 6.6 09/25/2014 0015   ALBUMIN 4.6 09/22/2023 0833   ALBUMIN 4.9 09/02/2023 1443   ALBUMIN 3.9 09/25/2014 0015   AST 22 09/22/2023 0833   AST 26 09/25/2014 0015   ALT 23 09/22/2023 0833   ALT 27 09/25/2014 0015   ALKPHOS 81 09/22/2023 0833   ALKPHOS 55 09/25/2014 0015   BILITOT 0.7 09/22/2023 0833   BILITOT <0.2 09/02/2023 1443   BILITOT 0.4 09/25/2014 0015   GFRNONAA >60 10/20/2023 1641   GFRNONAA >60 09/25/2014 0015   GFRNONAA >60 07/03/2014 0122   GFRAA >60 04/17/2020 0724   GFRAA >60 09/25/2014 0015   GFRAA >60 07/03/2014 0122    Lab Results  Component Value Date   WBC 5.3 10/20/2023   HGB  15.9 (H) 10/20/2023   HCT 47.0 (H) 10/20/2023   MCV 84.4 10/20/2023   PLT 127 (L) 10/20/2023   Lab Results  Component Value Date   HGBA1C 6.0 (A) 04/29/2022   Lab Results  Component Value Date   CHOL 166 08/26/2021   HDL 61.30 08/26/2021   LDLCALC 91 08/26/2021   TRIG 66.0 08/26/2021   CHOLHDL 3 08/26/2021   Lab Results  Component Value Date   TSH 1.730 09/02/2023     Assessment and Plan:  1. Irritable bowel syndrome with diarrhea Will try rifaximin, though it looks like it will require prior Auth so we will begin working on this. - rifaximin (XIFAXAN) 550 MG TABS tablet;  Take 1 tablet (550 mg total) by mouth 3 (three) times daily for 14 days.  Dispense: 42 tablet; Refill: 0  2. Nausea and vomiting, unspecified vomiting type Stop ondansetron, try metoclopramide instead. - metoCLOPramide (REGLAN) 5 MG tablet; Take 1 tablet (5 mg total) by mouth every 6 (six) hours as needed for nausea.  Dispense: 30 tablet; Refill: 0   I had a frank conversation with patient today regarding the clearly high complexity of her care overall.  She has many physical and psychiatric conditions in addition to polypharmacy which further complicates things.  She has several specialists.  Discussed with her that I believe moving forward, her care would best be handled by an internal medicine physician with many years of experience, but emphasized that I am happy to help in any way that I can until she finds a provider meeting that definition.  F/u TBD  I discussed the above assessment and treatment plan with the patient. The patient was provided an opportunity to ask questions and all were answered. The patient agreed with the plan and demonstrated an understanding of the instructions. The patient was advised to call back or seek an in-person evaluation if the symptoms worsen or if the condition fails to improve as anticipated. I provided a total time of 31 minutes inclusive of time utilized for medical chart review, information gathering, care coordination with staff, and documentation completion.  Alvester Morin, PA-C, DMSc, Nutritionist [facility] Medicine [facility] [phone]

## 2023-11-09 NOTE — ED Notes (Signed)
[facility]  CALLED  FOR  TRANSPORT  HOME 

## 2023-11-09 NOTE — Telephone Encounter (Signed)
PA completed waiting on insurance approval.  KP 

## 2023-11-09 NOTE — ED Notes (Signed)
Patient discharged from ED by provider. Discharge instructions reviewed with patient and all questions answered. Patient taken via stretcher ([facility]) from ED in NAD.

## 2023-11-09 NOTE — Telephone Encounter (Signed)
  Chief Complaint: Patient has scheduled on line- MyChart appointment with PCP- red word symptoms Symptoms: multiple complaints- near fainting, SOB, heart palpitations, diarrhea, abdominal pain Frequency: chronic- getting worse Pertinent Negatives: Patient denies heart palpations now, rectal bleeding Disposition: [] ED /[] Urgent Care (no appt availability in office) / [x] Appointment(In office/virtual)/ []  [place] [facility]/ [] Home Care/ [] Refused Recommended Disposition /[]  Mobile Bus/ []  Follow-up with PCP Additional Notes: Patient has scheduled MyChart appointment- appointment is within 10 minutes- patient advised appointment since so soon

## 2023-11-09 NOTE — Telephone Encounter (Signed)
Prior auth for rifaximin. Dx is IBS-D

## 2023-11-09 NOTE — Patient Outreach (Signed)
  Care Coordination   Initial Visit Note   11/09/2023 Name: [name] [name], [name] [name] MRN: [id number] DOB: 1966-04-09  [name] [name], [name] [name] is a 57 y.o. year old female who sees [name] [name], [name] [name] for primary care. I spoke with  [name] [name], [name] [name] by phone on 129/24.  What matters to the patients health and wellness today?  Close follow up with physicians, community resource support    Goals Addressed             This Visit's Progress    referral to a mental health therapist       Activities and task to complete in order to accomplish goals.   EMOTIONAL / MENTAL HEALTH SUPPORT Self Support options  (referral to be made for ongoing mental health therapy, continue follow up with psychiatrist, through [facility], CSW to continue to assist with community resource needs at supports )         SDOH assessments and interventions completed:  Yes  SDOH Interventions Today    Flowsheet Row Most Recent Value  SDOH Interventions   Food Insecurity Interventions Intervention Not Indicated  Housing Interventions Intervention Not Indicated  Transportation Interventions --  [patient has a car but needs work, changed some appts to virtual, may need to re-schedule, offered referral to transportation and [facility]-pt declined]  Utilities Interventions Intervention Not Indicated  Social Connections Interventions Other (Comment)  [discussed option of increasing social activities ie church, social groups([employer])]        Care Coordination Interventions:  Yes, provided  Interventions Today    Flowsheet Row Most Recent Value  Chronic Disease   Chronic disease during today's visit Other  [parkinsons disease, lupus, hypothyroidism]  General Interventions   General Interventions Discussed/Reviewed General Interventions Discussed, [employer], Level of Care  [multiple medical conditions discussed as well as resulting financial  challenges-pt reports being bed ridden since 2023, pt receives Tree surgeon, daughter recenlty awarded Tree surgeon -funds are in process]  Level of Care Personal Care Services  [private pay options discussed]  Mental Health Interventions   Mental Health Discussed/Reviewed Mental Health Discussed, Coping Strategies  [coping strategies to manage medical challenges discussed-pt confirms that prayer, talking  to daughter(rely on each other) and family dog is beneficial to her mental health - currently followed by [facility]-psychiatry would like therapist]  Nutrition Interventions   Nutrition Discussed/Reviewed Nutrition Discussed  [considering a picc line, due to difficulty tolerating foods(diarhea and ) rice and carnation breakfast drinks currently being used]       Follow up plan: Follow up call scheduled for 11/18/23    Encounter Outcome:  Patient Visit Completed

## 2023-11-09 NOTE — ED Notes (Signed)
Patient assisted into bed from wheelchair. Required standby assist with cane.   Patient also asking about her shoes. Triage RN and First RN called - stating that she did not come in with any shoes.

## 2023-11-09 NOTE — ED Provider Triage Note (Addendum)
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  Pt complains of fall at home. Patient felt dizzy and light headed, lost her balance and fell into the counter. She did not hit her head. She feels like she cannot walk without falling over because she is so dizzy.  Patient refused CT head and chest xray. States she had a negative MRI and had a chest xray 2 weeks ago. Wants to speak to the doctor.  Review of Systems  Positive: Dizziness, vomiting, chest pain, sob, abdominal pain Negative:   Physical Exam  Ht 5\' 10"  (1.778 m)   Wt 78.1 kg   LMP 12/15/2015 Comment: neg urine test   BMI 24.71 kg/m  Gen:   Awake, no distress   Resp:  Normal effort  MSK:   Moves extremities without difficulty  Other:    Medical Decision Making  Medically screening exam initiated at 10:32 AM.  Appropriate orders placed.  Fabiha Charolett Bumpers, MD was informed that the remainder of the evaluation will be completed by another provider, this initial triage assessment does not replace that evaluation, and the importance of remaining in the ED until their evaluation is complete.     Cameron Ali, PA-C 11/09/23 1036    Cameron Ali, PA-C 11/09/23 1053

## 2023-11-10 ENCOUNTER — Telehealth: Payer: Self-pay | Admitting: Physician Assistant

## 2023-11-10 NOTE — Telephone Encounter (Signed)
Approved. . Authorization Expiration Date: November 08, 2024.

## 2023-11-10 NOTE — Telephone Encounter (Signed)
Ria from [facility] called to get medication approval for rifaximin (XIFAXAN) 550 MG TABS tablet. Please f/u with Ria at [phone]

## 2023-11-10 NOTE — Telephone Encounter (Signed)
Noted  KP 

## 2023-11-11 DIAGNOSIS — K529 Noninfective gastroenteritis and colitis, unspecified: Secondary | ICD-10-CM | POA: Diagnosis not present

## 2023-11-11 DIAGNOSIS — G909 Disorder of the autonomic nervous system, unspecified: Secondary | ICD-10-CM | POA: Diagnosis not present

## 2023-11-11 DIAGNOSIS — G901 Familial dysautonomia [Riley-Day]: Secondary | ICD-10-CM | POA: Diagnosis not present

## 2023-11-15 DIAGNOSIS — H40053 Ocular hypertension, bilateral: Secondary | ICD-10-CM | POA: Diagnosis not present

## 2023-11-15 DIAGNOSIS — Z79899 Other long term (current) drug therapy: Secondary | ICD-10-CM | POA: Diagnosis not present

## 2023-11-15 DIAGNOSIS — H16223 Keratoconjunctivitis sicca, not specified as Sjogren's, bilateral: Secondary | ICD-10-CM | POA: Diagnosis not present

## 2023-11-18 ENCOUNTER — Encounter: Payer: Self-pay | Admitting: *Deleted

## 2023-11-22 DIAGNOSIS — G20C Parkinsonism, unspecified: Secondary | ICD-10-CM | POA: Diagnosis not present

## 2023-11-22 DIAGNOSIS — G249 Dystonia, unspecified: Secondary | ICD-10-CM | POA: Diagnosis not present

## 2023-11-26 ENCOUNTER — Ambulatory Visit: Payer: Self-pay | Admitting: *Deleted

## 2023-11-26 NOTE — Patient Instructions (Signed)
Visit Information  Thank you for taking time to visit with me today. Please don't hesitate to contact me if I can be of assistance to you.   Following are the goals we discussed today:   Goals Addressed             This Visit's Progress    referral to a mental health therapist       Activities and task to complete in order to accomplish goals.   EMOTIONAL / MENTAL HEALTH SUPPORT Keep all upcoming appointments discussed today Self Support options  ( follow up with psychiatrist, through [facility], scheduled for 12/28/23 at 3pm-patient to schedule follow up with therapist on day of appt with psychiatrist, new patient appt with PCP 12/27/23 2pm) Please contact the [facility] [phone]          Our next appointment is by telephone on 12/30/23 at 1pm  Please call the care guide team at [phone] if you need to cancel or reschedule your appointment.   If you are experiencing a Mental Health or Behavioral Health Crisis or need someone to talk to, please call the Suicide and Crisis Lifeline: 988   Patient verbalizes understanding of instructions and care plan provided today and agrees to view in MyChart. Active MyChart status and patient understanding of how to access instructions and care plan via MyChart confirmed with patient.     Telephone follow up appointment with care management team member scheduled for: 12/30/23  Verna Czech, LCSW [place]  [facility] Licensed Clinical Social Worker Care Coordinator  Direct Dial: [phone]

## 2023-11-26 NOTE — Patient Outreach (Signed)
  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  What matters to the patients health and wellness today?  Mental Health and primary care follow up along with community resources to donate home furnishings.    Goals Addressed             This Visit's Progress    referral to a mental health therapist       Activities and task to complete in order to accomplish goals.   EMOTIONAL / MENTAL HEALTH SUPPORT Keep all upcoming appointments discussed today Self Support options  ( follow up with psychiatrist, through [facility], scheduled for 12/28/23 at 3pm-patient to schedule follow up with therapist on day of appt with psychiatrist, new patient appt with PCP 12/27/23 2pm) Please contact the [facility] [phone]          SDOH assessments and interventions completed:  No     Care Coordination Interventions:  Yes, provided  Interventions Today    Flowsheet Row Most Recent Value  Chronic Disease   Chronic disease during today's visit --  [parkinson's, lupus, hypothryroidism]  General Interventions   General Interventions Discussed/Reviewed General Interventions Reviewed, Doctor Visits, Community Resources  Doctor Visits Discussed/Reviewed PCP  [facility] PCP appt scheduled 12/26/22 at 2pm]  PCP/Specialist Visits Compliance with follow-up visit  Mental Health Interventions   Mental Health Discussed/Reviewed Mental Health Discussed  [facility] [place], MD challenge in getting follow up appointment, collaboration phone call to [facility]]       Follow up plan: Follow up call scheduled for 12/30/23    Encounter Outcome:  Patient Visit Completed

## 2023-11-29 ENCOUNTER — Ambulatory Visit: Payer: Medicare Other | Admitting: Family

## 2023-12-06 ENCOUNTER — Ambulatory Visit: Payer: Medicare Other | Admitting: Urology

## 2023-12-10 ENCOUNTER — Other Ambulatory Visit: Payer: Self-pay | Admitting: Pharmacist

## 2023-12-10 NOTE — Patient Instructions (Signed)
 Goals Addressed             This Visit's Progress    Pharmacy Goals       For assistance with the cost of your generic Advair inhaler, there are a couple of foundation grants available to help with these copayments, including the PAN Foundation and Patient [employer]. Both of these funds are currently closed (meaning not enrolling patients right now), but these can open for new applications throughout the year.    Please consider signing up for the waitlist for the PAN Foundation fund at:    https://www.panfoundation.org/disease-funds/[email or web address]     You can sign up for alerts from the Patient [employer] Foundation at:   https://copays.org/funds/asthma/      Thank you!   Sharyle Sia, PharmD, [facility] [phone]

## 2023-12-10 NOTE — Progress Notes (Signed)
 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   They were referred to the pharmacist by their PCP for assistance in managing complex medication management.      Subjective:   Care Team: Primary Care Provider: Kristina Tinnie MARLA DEVONNA; Initial Appointment: 12/27/2023 Cardiologist: Perla Evalene PARAS, MD Neurologist: Lincoln Redell Maduro, MD Urologist: Gaston Hamilton GI Specialist: [facility] Pain Specialist: Fernand Grumbles, MD  Psychiatrist: Arloa Fallow, DO - [facility] Rheumatologist: Claudene Righter, MD; Next Scheduled Visit: 01/17/2024 Endocrinologist: Von Ami Lesches, MD  Social Worker: [place], [place], [place]; Next Scheduled Visit: 12/30/2023  Medication Access/Adherence  Current Pharmacy:  [id number] [address], [place] - [address] AT [facility] OF [address] [place] [address] Phone: [phone] Fax: [phone]   Patient reports affordability concerns with their medications: No Patient reports access/transportation concerns to their pharmacy: Now working with LCSW Patient reports adherence concerns with their medications:  No     Note Dexilant  and Advair now covered through patient's prescription plan  Today reports that she is switching her primary care provider to PA Tinnie Kristina at [facility]. Patient has initial appointment scheduled for 12/27/2023   Asthma:   Current medications: - Advair 250-50 mcg/act - 1 puff twice daily - albuterol  nebulizer solution - 1 vial via nebulizer every 6 hours as needed for wheezing/shortness of breath - albuterol  HFA inhaler - 1 to 2 puffs every 4 hours as needed for wheezing or shortness of breath   Reports breathing now improved with using  Advair inhaler. Confirms using twice daily as directed and rinsing and spitting out after each use   Reports now needing her albuterol  only occasionally   Current medication access support: none   Objective:  Lab Results  Component Value Date   CREATININE 0.90 11/09/2023   BUN 6 11/09/2023   NA 142 11/09/2023   K 4.0 11/09/2023   CL 105 11/09/2023   CO2 27 11/09/2023     Current Outpatient Medications on File Prior to Visit  Medication Sig Dispense Refill   albuterol  (PROVENTIL ) (2.5 MG/3ML) 0.083% nebulizer solution Take 3 mLs (2.5 mg total) by nebulization every 6 (six) hours as needed for wheezing or shortness of breath. 150 mL 12   albuterol  (VENTOLIN  HFA) 108 (90 Base) MCG/ACT inhaler INHALE 1 TO 2 PUFFS INTO THE LUNGS EVERY 4 HOURS AS NEEDED FOR WHEEZING OR SHORTNESS OF BREATH 18 g 11   bismuth subsalicylate (PEPTO BISMOL) 262 MG chewable tablet Chew 524 mg by mouth as needed.     calcium carbonate (TUMS EX) 750 MG chewable tablet Chew 1 tablet by mouth 3 (three) times daily as needed for heartburn.     calcium citrate (CALCITRATE - DOSED IN MG ELEMENTAL CALCIUM) 950 (200 Ca) MG tablet Take 500 mg of elemental calcium by mouth daily.     carbidopa-levodopa (SINEMET CR) 50-200 MG tablet Take 1 tablet by mouth 3 (three) times daily.     colestipol  (COLESTID ) 1 g tablet Take 3 tablets (3 g total) by mouth 2 (two) times daily. (Patient taking differently: Take 4 g by mouth 2 (two) times daily.) 180 tablet 1   dexlansoprazole  (DEXILANT ) 60 MG capsule Take 1 capsule (60 mg total) by mouth daily. 90  capsule 3   diazepam  (VALIUM ) 5 MG tablet Take 2.5 mg by mouth 2 (two) times daily as needed for anxiety.     diclofenac  sodium (VOLTAREN ) 1 % GEL Apply 2 g topically 4 (four) times daily as needed (for knee & finger pain.).      dicyclomine  (BENTYL ) 20 MG tablet Take 1 tablet (20 mg total) by mouth every 6 (six) hours. (Patient taking differently: Take 20 mg by mouth every 6 (six) hours.  Pt is taking 3 times a daily) 120 tablet 2   DULoxetine  (CYMBALTA ) 60 MG capsule Take 1 capsule (60 mg total) by mouth 2 (two) times daily. 120 capsule 1   EPINEPHrine  0.3 mg/0.3 mL IJ SOAJ injection Inject 0.3 mg into the muscle as needed for anaphylaxis. 1 each 2   fluticasone -salmeterol (ADVAIR DISKUS) 250-50 MCG/ACT AEPB Inhale 1 puff into the lungs in the morning and at bedtime. Rinse mouth after use 60 each 11   HYDROcodone -acetaminophen  (NORCO/VICODIN) 5-325 MG tablet Take 1 tablet by mouth 2 (two) times daily as needed. Take 2xs daily     hydroxychloroquine  (PLAQUENIL ) 200 MG tablet Plaquenil  200 mg tablet  Take 1 tablet twice a day by oral route after meals for 90 days.     lactase (LACTAID) 3000 units tablet Take 3,000 Units by mouth 3 (three) times daily with meals.     latanoprost (XALATAN) 0.005 % ophthalmic solution 1 drop at bedtime.     levothyroxine  (SYNTHROID ) 75 MCG tablet TAKE 1 TABLET BY MOUTH DAILY 30 MINUTES BEFORE BREAKFAST 90 tablet 3   metoCLOPramide  (REGLAN ) 5 MG tablet Take 1 tablet (5 mg total) by mouth every 6 (six) hours as needed for nausea. 30 tablet 0   montelukast  (SINGULAIR ) 10 MG tablet TAKE 1 TABLET BY MOUTH EVERY NIGHT AT BEDTIME 90 tablet 3   ondansetron  (ZOFRAN -ODT) 8 MG disintegrating tablet TAKE 1 TABLET BY MOUTH EVERY 8 HOURS AS NEEDED FOR NAUSEA OR VOMTING 90 tablet 2   PAXIL  20 MG tablet Take 20 mg by mouth daily.     Polyvinyl Alcohol -Povidone (REFRESH OP) Place 1 drop into both eyes 2 (two) times daily.     sucralfate  (CARAFATE ) 1 g tablet Take 1 g by mouth 4 (four) times daily.     topiramate  (TOPAMAX ) 50 MG tablet Take 3 tablets (150 mg total) by mouth 2 (two) times daily. 180 tablet 2   traZODone  (DESYREL ) 50 MG tablet SMARTSIG:1.0 Tablet(s) By Mouth Every Night     triamcinolone  ointment (KENALOG ) 0.5 % Apply 1 Application topically 2 (two) times daily. 30 g 2   Zoledronic  Acid (RECLAST  IV)      No current facility-administered medications on  file prior to visit.        Assessment/Plan:   Have encouraged patient to consider starting to use a weekly pillbox   Encourage patient to follow up with PCP and specialists as needed for new or worsening medical concerns    Asthma: - Control improved - Meets financial criteria for [employer] and Patient [employer] - Asthma fund grants, but note that this grant is currently closed. Again recommend patient apply for waitlist/alerts for thes grant funds. Will send patient this information via MyChart      Follow Up Plan:   Patient denies further medication questions or concerns today. No further follow up scheduled as patient is changing to a new primary care provider office Provide patient with contact information for clinic pharmacist to contact if needed in future for  medication questions/concerns    Sharyle Sia, PharmD, [facility] [phone]

## 2023-12-15 DIAGNOSIS — M1711 Unilateral primary osteoarthritis, right knee: Secondary | ICD-10-CM | POA: Diagnosis not present

## 2023-12-15 DIAGNOSIS — M23231 Derangement of other medial meniscus due to old tear or injury, right knee: Secondary | ICD-10-CM | POA: Diagnosis not present

## 2023-12-17 ENCOUNTER — Other Ambulatory Visit: Payer: Self-pay | Admitting: Orthopedic Surgery

## 2023-12-17 DIAGNOSIS — M23231 Derangement of other medial meniscus due to old tear or injury, right knee: Secondary | ICD-10-CM

## 2023-12-17 DIAGNOSIS — M1711 Unilateral primary osteoarthritis, right knee: Secondary | ICD-10-CM

## 2023-12-21 ENCOUNTER — Encounter: Payer: Self-pay | Admitting: Orthopedic Surgery

## 2023-12-22 DIAGNOSIS — M25569 Pain in unspecified knee: Secondary | ICD-10-CM | POA: Diagnosis not present

## 2023-12-22 DIAGNOSIS — M79642 Pain in left hand: Secondary | ICD-10-CM | POA: Diagnosis not present

## 2023-12-22 DIAGNOSIS — G894 Chronic pain syndrome: Secondary | ICD-10-CM | POA: Diagnosis not present

## 2023-12-22 DIAGNOSIS — M542 Cervicalgia: Secondary | ICD-10-CM | POA: Diagnosis not present

## 2023-12-22 NOTE — Patient Instructions (Incomplete)
Preventive Care 40-60 Years Old, Female Preventive care refers to lifestyle choices and visits with your health care provider that can promote health and wellness. Preventive care visits are also called wellness exams. What can I expect for my preventive care visit? Counseling Your health care provider may ask you questions about your: Medical history, including: Past medical problems. Family medical history. Pregnancy history. Current health, including: Menstrual cycle. Method of birth control. Emotional well-being. Home life and relationship well-being. Sexual activity and sexual health. Lifestyle, including: Alcohol, nicotine or tobacco, and drug use. Access to firearms. Diet, exercise, and sleep habits. Work and work environment. Sunscreen use. Safety issues such as seatbelt and bike helmet use. Physical exam Your health care provider will check your: Height and weight. These may be used to calculate your BMI (body mass index). BMI is a measurement that tells if you are at a healthy weight. Waist circumference. This measures the distance around your waistline. This measurement also tells if you are at a healthy weight and may help predict your risk of certain diseases, such as type 2 diabetes and high blood pressure. Heart rate and blood pressure. Body temperature. Skin for abnormal spots. What immunizations do I need?  Vaccines are usually given at various ages, according to a schedule. Your health care provider will recommend vaccines for you based on your age, medical history, and lifestyle or other factors, such as travel or where you work. What tests do I need? Screening Your health care provider may recommend screening tests for certain conditions. This may include: Lipid and cholesterol levels. Diabetes screening. This is done by checking your blood sugar (glucose) after you have not eaten for a while (fasting). Pelvic exam and Pap test. Hepatitis B test. Hepatitis C  test. HIV (human immunodeficiency virus) test. STI (sexually transmitted infection) testing, if you are at risk. Lung cancer screening. Colorectal cancer screening. Mammogram. Talk with your health care provider about when you should start having regular mammograms. This may depend on whether you have a family history of breast cancer. BRCA-related cancer screening. This may be done if you have a family history of breast, ovarian, tubal, or peritoneal cancers. Bone density scan. This is done to screen for osteoporosis. Talk with your health care provider about your test results, treatment options, and if necessary, the need for more tests. Follow these instructions at home: Eating and drinking  Eat a diet that includes fresh fruits and vegetables, whole grains, lean protein, and low-fat dairy products. Take vitamin and mineral supplements as recommended by your health care provider. Do not drink alcohol if: Your health care provider tells you not to drink. You are pregnant, may be pregnant, or are planning to become pregnant. If you drink alcohol: Limit how much you have to 0-1 drink a day. Know how much alcohol is in your drink. In the [place] one drink equals one 12 oz bottle of beer (355 mL), one 5 oz glass of wine (148 mL), or one 1 oz glass of hard liquor (44 mL). Lifestyle Brush your teeth every morning and night with fluoride toothpaste. Floss one time each day. Exercise for at least 30 minutes 5 or more days each week. Do not use any products that contain nicotine or tobacco. These products include cigarettes, chewing tobacco, and vaping devices, such as e-cigarettes. If you need help quitting, ask your health care provider. Do not use drugs. If you are sexually active, practice safe sex. Use a condom or other form of protection to   prevent STIs. If you do not wish to become pregnant, use a form of birth control. If you plan to become pregnant, see your health care provider for a  prepregnancy visit. Take aspirin only as told by your health care provider. Make sure that you understand how much to take and what form to take. Work with your health care provider to find out whether it is safe and beneficial for you to take aspirin daily. Find healthy ways to manage stress, such as: Meditation, yoga, or listening to music. Journaling. Talking to a trusted person. Spending time with friends and family. Minimize exposure to UV radiation to reduce your risk of skin cancer. Safety Always wear your seat belt while driving or riding in a vehicle. Do not drive: If you have been drinking alcohol. Do not ride with someone who has been drinking. When you are tired or distracted. While texting. If you have been using any mind-altering substances or drugs. Wear a helmet and other protective equipment during sports activities. If you have firearms in your house, make sure you follow all gun safety procedures. Seek help if you have been physically or sexually abused. What's next? Visit your health care provider once a year for an annual wellness visit. Ask your health care provider how often you should have your eyes and teeth checked. Stay up to date on all vaccines. This information is not intended to replace advice given to you by your health care provider. Make sure you discuss any questions you have with your health care provider. Document Revised: 05/14/2021 Document Reviewed: 05/14/2021 Elsevier Patient Education  2024 Elsevier Inc. Breast Self-Awareness Breast self-awareness is knowing how your breasts look and feel. You need to: Check your breasts on a regular basis. Tell your doctor about any changes. Become familiar with the look and feel of your breasts. This can help you catch a breast problem while it is still small and can be treated. You should do breast self-exams even if you have breast implants. What you need: A mirror. A well-lit room. A pillow or other  soft object. How to do a breast self-exam Follow these steps to do a breast self-exam: Look for changes  Take off all the clothes above your waist. Stand in front of a mirror in a room with good lighting. Put your hands down at your sides. Compare your breasts in the mirror. Look for any difference between them, such as: A difference in shape. A difference in size. Wrinkles, dips, and bumps in one breast and not the other. Look at each breast for changes in the skin, such as: Redness. Scaly areas. Skin that has gotten thicker. Dimpling. Open sores (ulcers). Look for changes in your nipples, such as: Fluid coming out of a nipple. Fluid around a nipple. Bleeding. Dimpling. Redness. A nipple that looks pushed in (retracted), or that has changed position. Feel for changes Lie on your back. Feel each breast. To do this: Pick a breast to feel. Place a pillow under the shoulder closest to that breast. Put the arm closest to that breast behind your head. Feel the nipple area of that breast using the hand of your other arm. Feel the area with the pads of your three middle fingers by making small circles with your fingers. Use light, medium, and firm pressure. Continue the overlapping circles, moving downward over the breast. Keep making circles with your fingers. Stop when you feel your ribs. Start making circles with your fingers again, this time going   upward until you reach your collarbone. Then, make circles outward across your breast and into your armpit area. Squeeze your nipple. Check for discharge and lumps. Repeat these steps to check your other breast. Sit or stand in the tub or shower. With soapy water on your skin, feel each breast the same way you did when you were lying down. Write down what you find Writing down what you find can help you remember what to tell your doctor. Write down: What is normal for each breast. Any changes you find in each breast. These  include: The kind of changes you find. A tender or painful breast. Any lump you find. Write down its size and where it is. When you last had your monthly period (menstrual cycle). General tips If you are breastfeeding, the best time to check your breasts is after you feed your baby or after you use a breast pump. If you get monthly bleeding, the best time to check your breasts is 5-7 days after your monthly cycle ends. With time, you will become comfortable with the self-exam. You will also start to know if there are changes in your breasts. Contact a doctor if: You see a change in the shape or size of your breasts or nipples. You see a change in the skin of your breast or nipples, such as red or scaly skin. You have fluid coming from your nipples that is not normal. You find a new lump or thick area. You have breast pain. You have any concerns about your breast health. Summary Breast self-awareness includes looking for changes in your breasts and feeling for changes within your breasts. You should do breast self-awareness in front of a mirror in a well-lit room. If you get monthly periods (menstrual cycles), the best time to check your breasts is 5-7 days after your period ends. Tell your doctor about any changes you see in your breasts. Changes include changes in size, changes on the skin, painful or tender breasts, or fluid from your nipples that is not normal. This information is not intended to replace advice given to you by your health care provider. Make sure you discuss any questions you have with your health care provider. Document Revised: 04/23/2022 Document Reviewed: 09/18/2021 Elsevier Patient Education  2024 Elsevier Inc.  

## 2023-12-22 NOTE — Progress Notes (Deleted)
 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 The patient is not sexually active. The patient has never been taking hormone replacement therapy. Patient denies post-menopausal vaginal bleeding. The patient wears seatbelts: yes. The patient participates in regular exercise: no. Has the patient ever been transfused or tattooed?: no. The patient reports that there is not currently domestic violence in her life. Does have h/o in the past with ex-husband.  Current complaints: 1.  ***    Gynecologic History Patient's last menstrual period was 12/15/2015. Contraception: post menopausal status Last Pap: 12/18/2022. Results were: normal Last mammogram: 02/25/2023. Results were: normal Last Colonoscopy: 12/16/2021: 10 years Last Dexa Scan: Never done   Obstetric History OB History  Gravida Para Term Preterm AB Living  1 1    1   SAB IAB Ectopic Multiple Live Births      1    # Outcome Date GA Lbr Len/2nd Weight Sex Type Anes PTL Lv  1 Para 06/19/98    F Vag-Spont   LIV    Past Medical History:  Diagnosis Date   ADHD    Allergy 12/30/2018   Seasonal   Anemia    ANXIETY 06/06/2007   Arthritis    related to lupus   Asthma    BACK PAIN 02/22/2009   Cervical cancer (HCC)    LEEP 2005    Chicken pox    Chronic pain    goes to pain management provider   Collagen vascular disease (HCC)    Connective tissue disease (HCC)    DEPRESSION 06/06/2007   Essential tremor    Fibromyalgia    Fibromyalgia    FREQUENCY, URINARY 07/07/2007   Gastritis    GERD 06/06/2007   Glaucoma    Headache    h/o migraines    History of hiatal hernia    History of kidney stones    HYPOTHYROIDISM 06/06/2007   INTERSTITIAL CYSTITIS 09/11/2010   Lupus 2006   Lupus    Menopause     Migraine    Mitral valve regurgitation    NEPHROLITHIASIS, HX OF 11/05/2010   Neuropathy    Neuropathy    OCD (obsessive compulsive disorder)    Optic neuritis    2005   OSTEOPENIA 10/14/2009   Osteoporosis    OVERACTIVE BLADDER 10/14/2009   PAIN, CHRONIC NEC 06/06/2007   Panniculitis    Parkinson disease (HCC) 04/03/2019   Parkinson disease (HCC)    POLYARTHRITIS 08/19/2007   Raynaud's disease    Sicca syndrome (HCC)    Ulcer 11/30/2017   Don't actually know date   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 Valentino Saxon [facility]    Vasculitis [facility])    WEIGHT GAIN 02/22/2009    Family History  Problem Relation Age of Onset   Hypertension Mother    Arthritis Mother    Heart disease Mother        afib   Asthma Mother    Asthma Sister    Asthma Daughter    Arthritis Daughter    Heart disease Daughter        ?heart condition on BB   ADD / ADHD Daughter    Depression Daughter    Intellectual disability Daughter    Learning disabilities Daughter  Pancreatic cancer Father    Cancer Father        pancreatitic    Diabetes Maternal Grandmother    Heart disease Maternal Grandmother    Arthritis Maternal Grandmother    Hypertension Maternal Grandmother    Dementia Maternal Grandmother    Colon cancer Maternal Uncle 45   Crohn's disease Maternal Aunt    Breast cancer Maternal Aunt 12   Diabetes Maternal Aunt    Breast cancer Cousin 46       maternal   Cancer Cousin        m cousin breat cancer s/p removal both breasts     Past Surgical History:  Procedure Laterality Date   ADENOIDECTOMY     APPENDECTOMY     CERVICAL BIOPSY  W/ LOOP ELECTRODE EXCISION  2005   CHOLECYSTECTOMY     COLONOSCOPY N/A 10/16/2022   Procedure: COLONOSCOPY;  Surgeon: Regis Bill, MD;  Location: [facility] ENDOSCOPY;  Service: Gastroenterology;  Laterality: N/A;   ESOPHAGOGASTRODUODENOSCOPY N/A 10/16/2022   Procedure: ESOPHAGOGASTRODUODENOSCOPY (EGD);  Surgeon:  Regis Bill, MD;  Location: [facility] ENDOSCOPY;  Service: Gastroenterology;  Laterality: N/A;   ESOPHAGOGASTRODUODENOSCOPY (EGD) WITH PROPOFOL N/A 08/27/2017   Procedure: ESOPHAGOGASTRODUODENOSCOPY (EGD) WITH PROPOFOL;  Surgeon: Scot Jun, MD;  Location: [facility] ENDOSCOPY;  Service: Endoscopy;  Laterality: N/A;   FRACTURE SURGERY  11/30/2008   HAND SURGERY     repair of left metatarsal    HARDWARE REMOVAL Right 02/16/2017   Procedure: HARDWARE REMOVAL FROM HIP;  Surgeon: Kennedy Bucker, MD;  Location: [facility] ORS;  Service: Orthopedics;  Laterality: Right;   JOINT REPLACEMENT     OTHER SURGICAL HISTORY     left 3rd metatarsal fracture repair 2011    REVISION TOTAL HIP ARTHROPLASTY  06/2009   replacement 2010 then screw and plate removal 1191; right hip   TONSILLECTOMY     TONSILLECTOMY     wrist  ligament repair bilateral     WRIST RECONSTRUCTION     WRIST SURGERY     laceration of right wrist ligaments   wrist surgery     laceration of left wrist surgery     Social History   Socioeconomic History   Marital status: Divorced    Spouse name: Not on file   Number of children: 2   Years of education: Not on file   Highest education level: Professional school degree (e.g., MD, DDS, DVM, JD)  Occupational History   Occupation: DISABLED    Employer: UNEMPLOYED  Tobacco Use   Smoking status: Never   Smokeless tobacco: Never  Vaping Use   Vaping status: Never Used  Substance and Sexual Activity   Alcohol use: No   Drug use: No   Sexual activity: Not Currently    Birth control/protection: Post-menopausal, None  Other Topics Concern   Not on file  Social History Narrative   She was previously a pediatrician, 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     She lives with daughter and mother      1 living child    Social Drivers of Corporate investment banker Strain: Low Risk  (12/15/2023)   Received  from [facility]   Overall Financial Resource Strain (CARDIA)    Difficulty of Paying Living Expenses: Not hard at all  Recent Concern: Financial Resource Strain - Medium Risk (11/18/2023)   Received [facility]  Overall Financial Resource Strain (CARDIA)    Difficulty of Paying Living Expenses: Somewhat hard  Food Insecurity: No Food Insecurity (12/15/2023)   Received [facility]   Hunger Vital Sign    Worried About Running Out of Food in the Last Year: Never true    Ran Out of Food in the Last Year: Never true  Recent Concern: Food Insecurity - Food Insecurity Present (11/18/2023)   Received from [facility]   Hunger Vital Sign    Worried About Running Out of Food in the Last Year: Sometimes true    Ran Out of Food in the Last Year: Sometimes true  Transportation Needs: No Transportation Needs (12/15/2023)   Received from [facility] - Transportation    In the past 12 months, has lack of transportation kept you from medical appointments or from getting medications?: No    Lack of Transportation (Non-Medical): No  Physical Activity: Inactive (11/18/2023)   Received from [facility]   Exercise Vital Sign    Days of Exercise per Week: 0 days    Minutes of Exercise per Session: 0 min  Stress: Stress Concern Present (11/18/2023)   Received from [facility] - Occupational Stress Questionnaire    Feeling of Stress : To some extent  Social Connections: Socially Isolated (11/18/2023)   Received from [facility]   Social Connection and Isolation Panel [NHANES]    Frequency of Communication with Friends and Family: Never    Frequency of Social Gatherings with Friends and Family: Never    Attends Religious Services: Never    Database administrator or Organizations: No    Attends Tax inspector Meetings: Never    Marital Status: Divorced  Catering manager Violence: Not At Risk (11/08/2023)   Humiliation, Afraid, Rape, and Kick questionnaire    Fear of Current or Ex-Partner: No    Emotionally Abused: No    Physically Abused: No    Sexually Abused: No    Current Outpatient Medications on File Prior to Visit  Medication Sig Dispense Refill   albuterol (PROVENTIL) (2.5 MG/3ML) 0.083% nebulizer solution Take 3 mLs (2.5 mg total) by nebulization every 6 (six) hours as needed for wheezing or shortness of breath. 150 mL 12   albuterol (VENTOLIN HFA) 108 (90 Base) MCG/ACT inhaler INHALE 1 TO 2 PUFFS INTO THE LUNGS EVERY 4 HOURS AS NEEDED FOR WHEEZING OR SHORTNESS OF BREATH 18 g 11   bismuth subsalicylate (PEPTO BISMOL) 262 MG chewable tablet Chew 524 mg by mouth as needed.     calcium carbonate (TUMS EX) 750 MG chewable tablet Chew 1 tablet by mouth 3 (three) times daily as needed for heartburn.     calcium citrate (CALCITRATE - DOSED IN MG ELEMENTAL CALCIUM) 950 (200 Ca) MG tablet Take 500 mg of elemental calcium by mouth daily.     carbidopa-levodopa (SINEMET CR) 50-200 MG tablet Take 1 tablet by mouth 3 (three) times daily.     colestipol (COLESTID) 1 g tablet Take 3 tablets (3 g total) by mouth 2 (two) times daily. (Patient taking differently: Take 4 g by mouth 2 (two) times daily.) 180 tablet 1   dexlansoprazole (DEXILANT) 60 MG capsule Take 1 capsule (60 mg total) by mouth daily. 90 capsule 3   diazepam (VALIUM) 5 MG tablet Take 2.5 mg by mouth 2 (two) times daily as needed for  anxiety.     diclofenac sodium (VOLTAREN) 1 % GEL Apply 2 g topically 4 (four) times daily as needed (for knee & finger pain.).      dicyclomine (BENTYL) 20 MG tablet Take 1 tablet (20 mg total) by mouth every 6 (six) hours. (Patient taking differently: Take 20 mg by mouth every 6 (six) hours. Pt is taking 3 times a daily) 120 tablet 2   DULoxetine (CYMBALTA) 60 MG capsule Take 1 capsule (60 mg total)  by mouth 2 (two) times daily. 120 capsule 1   EPINEPHrine 0.3 mg/0.3 mL IJ SOAJ injection Inject 0.3 mg into the muscle as needed for anaphylaxis. 1 each 2   fluticasone-salmeterol (ADVAIR DISKUS) 250-50 MCG/ACT AEPB Inhale 1 puff into the lungs in the morning and at bedtime. Rinse mouth after use 60 each 11   HYDROcodone-acetaminophen (NORCO/VICODIN) 5-325 MG tablet Take 1 tablet by mouth 2 (two) times daily as needed. Take 2x"s daily     hydroxychloroquine (PLAQUENIL) 200 MG tablet Plaquenil 200 mg tablet  Take 1 tablet twice a day by oral route after meals for 90 days.     lactase (LACTAID) 3000 units tablet Take 3,000 Units by mouth 3 (three) times daily with meals.     latanoprost (XALATAN) 0.005 % ophthalmic solution 1 drop at bedtime.     levothyroxine (SYNTHROID) 75 MCG tablet TAKE 1 TABLET BY MOUTH DAILY 30 MINUTES BEFORE BREAKFAST 90 tablet 3   metoCLOPramide (REGLAN) 5 MG tablet Take 1 tablet (5 mg total) by mouth every 6 (six) hours as needed for nausea. 30 tablet 0   montelukast (SINGULAIR) 10 MG tablet TAKE 1 TABLET BY MOUTH EVERY NIGHT AT BEDTIME 90 tablet 3   ondansetron (ZOFRAN-ODT) 8 MG disintegrating tablet TAKE 1 TABLET BY MOUTH EVERY 8 HOURS AS NEEDED FOR NAUSEA OR VOMTING 90 tablet 2   PAXIL 20 MG tablet Take 20 mg by mouth daily.     Polyvinyl Alcohol-Povidone (REFRESH OP) Place 1 drop into both eyes 2 (two) times daily.     sucralfate (CARAFATE) 1 g tablet Take 1 g by mouth 4 (four) times daily.     topiramate (TOPAMAX) 50 MG tablet Take 3 tablets (150 mg total) by mouth 2 (two) times daily. 180 tablet 2   traZODone (DESYREL) 50 MG tablet SMARTSIG:1.0 Tablet(s) By Mouth Every Night     triamcinolone ointment (KENALOG) 0.5 % Apply 1 Application topically 2 (two) times daily. 30 g 2   Zoledronic Acid (RECLAST IV)      No current facility-administered medications on file prior to visit.    Allergies  Allergen Reactions   Bee Venom Anaphylaxis, Itching and Swelling     Affected area Affected area Affected area Affected area    Fish Allergy Hives, Swelling and Rash    Facial swelling  Facial swelling  Facial swelling Facial swelling  Facial swelling    Latex Anaphylaxis, Rash and Shortness Of Breath    Rash  Rash     Peanut (Diagnostic) Hives   Peanut-Containing Drug Products Anaphylaxis   Prasterone Other (See Comments) and Nausea And Vomiting    rash Other reaction(s): Headache Headaches.   Shellfish Allergy Hives, Other (See Comments), Rash and Swelling    Facial swelling Uncoded Allergy. Allergen: seafood Uncoded Allergy. Allergen: CATS, Other Reaction: itch, wheezing Uncoded Allergy. Allergen: COMPAZINE, Other Reaction: tremors Facial swelling Facial swelling Uncoded Allergy. Allergen: seafood Uncoded Allergy. Allergen: CATS, Other Reaction: itch, wheezing Uncoded Allergy. Allergen: COMPAZINE, Other Reaction: tremors Facial  swelling Uncoded Allergy. Allergen: seafood Uncoded Allergy. Allergen: CATS, Other Reaction: itch, wheezing Uncoded Allergy. Allergen: COMPAZINE, Other Reaction: tremors Facial swelling Facial swelling Uncoded Allergy. Allergen: seafood Uncoded Allergy. Allergen: CATS, Other Reaction: itch, wheezing Uncoded Allergy. Allergen: COMPAZINE, Other Reaction: tremors Facial swelling Facial swelling Uncoded Allergy. Allergen: seafood Uncoded Allergy. Allergen: CATS, Other Reaction: itch, wheezing Uncoded Allergy. Allergen: COMPAZINE, Other Reaction: tremors    Shellfish-Derived Products Hives, Other (See Comments), Rash and Swelling    Facial swelling Uncoded Allergy. Allergen: seafood Uncoded Allergy. Allergen: CATS, Other Reaction: itch, wheezing Uncoded Allergy. Allergen: COMPAZINE, Other Reaction: tremors Facial swelling Facial swelling Uncoded Allergy. Allergen: seafood Uncoded Allergy. Allergen: CATS, Other Reaction: itch, wheezing Uncoded Allergy. Allergen: COMPAZINE, Other Reaction: tremors Facial  swelling Uncoded Allergy. Allergen: seafood Uncoded Allergy. Allergen: CATS, Other Reaction: itch, wheezing Uncoded Allergy. Allergen: COMPAZINE, Other Reaction: tremors Facial swelling   Compazine [Prochlorperazine Edisylate] Other (See Comments)    tremors   Dhea [Nutritional Supplements] Other (See Comments)    Headaches.   Dilaudid [Hydromorphone Hcl]     ? reaction   Famotidine Other (See Comments) and Diarrhea    Other reaction(s): Headache Gas   Gabapentin Other (See Comments)    'snowed out'    Lactose    Lactose Intolerance (Gi)    Other Other (See Comments)    Other reaction(s): Unknown    Prochlorperazine     Other reaction(s): Other (See Comments) ticks   Promethazine     Other reaction(s): Other (See Comments) Ticks   Promethazine Hcl Other (See Comments)    CNS disorder   Reclast [Zoledronic Acid]     Weakness could not move limbs, fatigue, increase sleep   Clarithromycin Hives and Rash   Clotrimazole Swelling and Rash   Hydromorphone Hcl Rash and Hives    "Rash all over"   Penicillins Rash and Other (See Comments)    Has patient had a PCN reaction causing immediate rash, facial/tongue/throat swelling, SOB or lightheadedness with hypotension:No Has patient had a PCN reaction causing severe rash involving mucus membranes or skin necrosis:No Has patient had a PCN reaction that required hospitalization:No Has patient had a PCN reaction occurring within the last 10 years:No If all of the above answers are "NO", then may proceed with Cephalosporin use.      Review of Systems ROS Review of Systems - General ROS: negative for - chills, fatigue, fever, hot flashes, night sweats, weight gain or weight loss Psychological ROS: negative for - anxiety, decreased libido, depression, mood swings, physical abuse or sexual abuse Ophthalmic ROS: negative for - blurry vision, eye pain or loss of vision ENT ROS: negative for - headaches, hearing change, visual changes or  vocal changes Allergy and Immunology ROS: negative for - hives, itchy/watery eyes or seasonal allergies Hematological and Lymphatic ROS: negative for - bleeding problems, bruising, swollen lymph nodes or weight loss Endocrine ROS: negative for - galactorrhea, hair pattern changes, hot flashes, malaise/lethargy, mood swings, palpitations, polydipsia/polyuria, skin changes, temperature intolerance or unexpected weight changes Breast ROS: negative for - new or changing breast lumps or nipple discharge Respiratory ROS: negative for - cough or shortness of breath Cardiovascular ROS: negative for - chest pain, irregular heartbeat, palpitations or shortness of breath Gastrointestinal ROS: no abdominal pain, change in bowel habits, or black or bloody stools Genito-Urinary ROS: no dysuria, trouble voiding, or hematuria Musculoskeletal ROS: negative for - joint pain or joint stiffness Neurological ROS: negative for - bowel and bladder control changes Dermatological ROS: negative for  rash and skin lesion changes   Objective:   LMP 12/15/2015 Comment: neg urine test  CONSTITUTIONAL: Well-developed, well-nourished female in no acute distress.  PSYCHIATRIC: Normal mood and affect. Normal behavior. Normal judgment and thought content. NEUROLGIC: Alert and oriented to person, place, and time. Normal muscle tone coordination. No cranial nerve deficit noted. HENT:  Normocephalic, atraumatic, External right and left ear normal. Oropharynx is clear and moist EYES: Conjunctivae and EOM are normal. Pupils are equal, round, and reactive to light. No scleral icterus.  NECK: Normal range of motion, supple, no masses.  Normal thyroid.  SKIN: Skin is warm and dry. No rash noted. Not diaphoretic. No erythema. No pallor. CARDIOVASCULAR: Normal heart rate noted, regular rhythm, no murmur. RESPIRATORY: Clear to auscultation bilaterally. Effort and breath sounds normal, no problems with respiration noted. BREASTS:  Symmetric in size. No masses, skin changes, nipple drainage, or lymphadenopathy. ABDOMEN: Soft, normal bowel sounds, no distention noted.  No tenderness, rebound or guarding.  BLADDER: Normal PELVIC:  Bladder {:311640}  Urethra: {:311719}  Vulva: {:311722}  Vagina: {:311643}  Cervix: {:311644}  Uterus: {:311718}  Adnexa: {:311645}  RV: {Blank multiple:19196::"External Exam NormaI","No Rectal Masses","Normal Sphincter tone"}  MUSCULOSKELETAL: Normal range of motion. No tenderness.  No cyanosis, clubbing, or edema.  2+ distal pulses. LYMPHATIC: No Axillary, Supraclavicular, or Inguinal Adenopathy.   Labs: Lab Results  Component Value Date   WBC 5.5 11/09/2023   HGB 16.0 (H) 11/09/2023   HCT 46.9 (H) 11/09/2023   MCV 83.9 11/09/2023   PLT 149 (L) 11/09/2023    Lab Results  Component Value Date   CREATININE 0.90 11/09/2023   BUN 6 11/09/2023   NA 142 11/09/2023   K 4.0 11/09/2023   CL 105 11/09/2023   CO2 27 11/09/2023    Lab Results  Component Value Date   ALT 24 11/09/2023   AST 21 11/09/2023   ALKPHOS 66 11/09/2023   BILITOT 1.1 11/09/2023    Lab Results  Component Value Date   CHOL 166 08/26/2021   HDL 61.30 08/26/2021   LDLCALC 91 08/26/2021   TRIG 66.0 08/26/2021   CHOLHDL 3 08/26/2021    Lab Results  Component Value Date   TSH 1.830 11/09/2023    Lab Results  Component Value Date   HGBA1C 6.0 (A) 04/29/2022     Assessment:   1. Encounter for well woman exam with routine gynecological exam   2. Cervical cancer screening   3. Encounter for screening mammogram for malignant neoplasm of breast   4. Prediabetes   5. Vitamin D deficiency   6. Hypothyroidism, unspecified type      Plan:  Pap: Pap, Reflex if ASCUS Mammogram: Ordered Colon Screening:   UTD Labs:  Pending Routine preventative health maintenance measures emphasized: {Blank multiple:19196::"Exercise/Diet/Weight control","Tobacco Warnings","Alcohol/Substance use risks","Stress  Management","Peer Pressure Issues","Safe Sex"} Flu vaccine status: 08/09/2023 COVID Vaccination status: Return to Clinic - 1 Year   Hildred Laser, MD [facility]

## 2023-12-23 ENCOUNTER — Ambulatory Visit: Payer: Medicare Other | Admitting: Obstetrics and Gynecology

## 2023-12-23 DIAGNOSIS — R7303 Prediabetes: Secondary | ICD-10-CM

## 2023-12-23 DIAGNOSIS — Z01419 Encounter for gynecological examination (general) (routine) without abnormal findings: Secondary | ICD-10-CM

## 2023-12-23 DIAGNOSIS — E039 Hypothyroidism, unspecified: Secondary | ICD-10-CM

## 2023-12-23 DIAGNOSIS — E559 Vitamin D deficiency, unspecified: Secondary | ICD-10-CM

## 2023-12-23 DIAGNOSIS — Z1231 Encounter for screening mammogram for malignant neoplasm of breast: Secondary | ICD-10-CM

## 2023-12-23 DIAGNOSIS — Z124 Encounter for screening for malignant neoplasm of cervix: Secondary | ICD-10-CM

## 2023-12-27 ENCOUNTER — Ambulatory Visit (INDEPENDENT_AMBULATORY_CARE_PROVIDER_SITE_OTHER): Payer: Medicare Other | Admitting: Physician Assistant

## 2023-12-27 ENCOUNTER — Telehealth: Payer: Self-pay | Admitting: Physician Assistant

## 2023-12-27 ENCOUNTER — Encounter: Payer: Self-pay | Admitting: Physician Assistant

## 2023-12-27 VITALS — BP 142/92 | HR 86 | Temp 98.0°F | Resp 16 | Ht 68.0 in | Wt 172.8 lb

## 2023-12-27 DIAGNOSIS — F32A Depression, unspecified: Secondary | ICD-10-CM

## 2023-12-27 DIAGNOSIS — G8929 Other chronic pain: Secondary | ICD-10-CM

## 2023-12-27 DIAGNOSIS — R634 Abnormal weight loss: Secondary | ICD-10-CM

## 2023-12-27 DIAGNOSIS — R197 Diarrhea, unspecified: Secondary | ICD-10-CM

## 2023-12-27 DIAGNOSIS — K219 Gastro-esophageal reflux disease without esophagitis: Secondary | ICD-10-CM | POA: Diagnosis not present

## 2023-12-27 DIAGNOSIS — R109 Unspecified abdominal pain: Secondary | ICD-10-CM

## 2023-12-27 DIAGNOSIS — R630 Anorexia: Secondary | ICD-10-CM

## 2023-12-27 DIAGNOSIS — F419 Anxiety disorder, unspecified: Secondary | ICD-10-CM

## 2023-12-27 DIAGNOSIS — M329 Systemic lupus erythematosus, unspecified: Secondary | ICD-10-CM

## 2023-12-27 DIAGNOSIS — R112 Nausea with vomiting, unspecified: Secondary | ICD-10-CM

## 2023-12-27 DIAGNOSIS — Z7689 Persons encountering health services in other specified circumstances: Secondary | ICD-10-CM

## 2023-12-27 NOTE — Progress Notes (Signed)
 [facility] [address] [place], [place] [address]  Internal MEDICINE  Office Visit Note  Patient Name: [name] [name], [place]  [id number]  [id number]  Date of Service: 01/05/2024   Complaints/HPI Pt is here for establishment of PCP. Chief Complaint  Patient presents with   New Patient (Initial Visit)   HPI Pt is here to establish care -pt has a history of several chronic medical conditions, with progression in last 2 years -pt states she has been sick for the past 2 years, without any significant findings or answers as to why. -She has been getting headaches daily since 2023, fainting/lightheaded, numbness in face, hands, feet and left leg -reports some wheezing, uses neb and advair, but still symptomatic.  -Abdominal pain, reflux, diarrhea, nausea and vomiting, reduced appetite -Lost 100lbs over last 2 years. Only tolerates rice and carnation drink, everything else causes it to get worse. Seen 2 different GI providers. Would like neurogastroenterologist, but hasn't been able to find one.. Saw Dr. Mia Creek originally, denied by other GI locally as they stated she is already established with Dr. Mia Creek despite not following with him anymore. Seeing [facility] now. Visit recently did not go well though and states they wanted to repeat all tests already done including new colonoscopy, but she doesn't have anyone that can drive her for this and states they would not do SIBO testing she requested. She is going to see alternative provider in the same office in April now. Would like referral to [facility] for closer option -Also has urinary Frequency and urgency. UTI in Nov, but urgency/frequency never went away. -glaucoma dx last year. Then at follow up said it was worse despite meds. -Only Family is mom with dementia, and her daughter. [facility] recently, ordered several tests but were normal (Dr. Theodis Aguas) -also sees parkinsons neurologist separately, Dr. Mills [facility]  cardiology for palpitations, CP, and SOB. Found nothing to cause this and thought to be due to stress, which she denies being cause of symptoms for over 2 years now. Found mild MR. -No hx of HTN -tried calling [facility], but they told her nothing in [place]. -has been to ED many times as well for these concerns -Back pain July 2023. Does see pain specialist. -ortho due to pain in knees, different than her chronic pain. Supposed to be getting MRI for possible tear. -Seeing new rheumatologist next month, hx of lupus. Reports being taken off of dapsone and prednisone 2 years ago before she started feeling worse and wonders if various symptoms could be related to lupus   -she is seeing psychiatrist, but no psychologist and would benefit from therapy. Will place referral -does see dentist as well, grinds teeth, told to wear night guard. -Pt is a retired pediatrician  Current Medication: Outpatient Encounter Medications as of 12/27/2023  Medication Sig Note   albuterol (PROVENTIL) (2.5 MG/3ML) 0.083% nebulizer solution Take 3 mLs (2.5 mg total) by nebulization every 6 (six) hours as needed for wheezing or shortness of breath.    albuterol (VENTOLIN HFA) 108 (90 Base) MCG/ACT inhaler INHALE 1 TO 2 PUFFS INTO THE LUNGS EVERY 4 HOURS AS NEEDED FOR WHEEZING OR SHORTNESS OF BREATH    bismuth subsalicylate (PEPTO BISMOL) 262 MG chewable tablet Chew 524 mg by mouth as needed.    calcium carbonate (TUMS EX) 750 MG chewable tablet Chew 1 tablet by mouth 3 (three) times daily as needed for heartburn.    carbidopa-levodopa (SINEMET CR) 50-200 MG tablet Take 1 tablet  by mouth 3 (three) times daily.    colestipol (COLESTID) 1 g tablet Take 3 tablets (3 g total) by mouth 2 (two) times daily. (Patient taking differently: Take 4 g by mouth 2 (two) times daily.)    dexlansoprazole (DEXILANT) 60 MG capsule Take 1 capsule (60 mg total) by mouth daily.    diazepam (VALIUM) 5 MG tablet Take 2.5 mg by mouth 2 (two) times daily  as needed for anxiety.    diclofenac sodium (VOLTAREN) 1 % GEL Apply 2 g topically 4 (four) times daily as needed (for knee & finger pain.).     dicyclomine (BENTYL) 20 MG tablet Take 1 tablet (20 mg total) by mouth every 6 (six) hours. (Patient taking differently: Take 20 mg by mouth every 6 (six) hours. Pt is taking 3 times a daily) 11/05/2023: Taking three times daily as needed for abdominal pain/cramping   DULoxetine (CYMBALTA) 60 MG capsule Take 1 capsule (60 mg total) by mouth 2 (two) times daily.    EPINEPHrine 0.3 mg/0.3 mL IJ SOAJ injection Inject 0.3 mg into the muscle as needed for anaphylaxis.    fluticasone-salmeterol (ADVAIR DISKUS) 250-50 MCG/ACT AEPB Inhale 1 puff into the lungs in the morning and at bedtime. Rinse mouth after use    HYDROcodone-acetaminophen (NORCO/VICODIN) 5-325 MG tablet Take 1 tablet by mouth 2 (two) times daily as needed. Take 2x"s daily    hydroxychloroquine (PLAQUENIL) 200 MG tablet Plaquenil 200 mg tablet  Take 1 tablet twice a day by oral route after meals for 90 days.    lactase (LACTAID) 3000 units tablet Take 3,000 Units by mouth 3 (three) times daily with meals.    latanoprost (XALATAN) 0.005 % ophthalmic solution 1 drop at bedtime.    levothyroxine (SYNTHROID) 75 MCG tablet TAKE 1 TABLET BY MOUTH DAILY 30 MINUTES BEFORE BREAKFAST    metoCLOPramide (REGLAN) 5 MG tablet Take 1 tablet (5 mg total) by mouth every 6 (six) hours as needed for nausea.    montelukast (SINGULAIR) 10 MG tablet TAKE 1 TABLET BY MOUTH EVERY NIGHT AT BEDTIME    ondansetron (ZOFRAN-ODT) 8 MG disintegrating tablet TAKE 1 TABLET BY MOUTH EVERY 8 HOURS AS NEEDED FOR NAUSEA OR VOMTING    PAXIL 20 MG tablet Take 20 mg by mouth daily.    topiramate (TOPAMAX) 50 MG tablet Take 3 tablets (150 mg total) by mouth 2 (two) times daily.    triamcinolone ointment (KENALOG) 0.5 % Apply 1 Application topically 2 (two) times daily.    Zoledronic Acid (RECLAST IV)     [DISCONTINUED] calcium citrate  (CALCITRATE - DOSED IN MG ELEMENTAL CALCIUM) 950 (200 Ca) MG tablet Take 500 mg of elemental calcium by mouth daily.    [DISCONTINUED] Polyvinyl Alcohol-Povidone (REFRESH OP) Place 1 drop into both eyes 2 (two) times daily.    [DISCONTINUED] sucralfate (CARAFATE) 1 g tablet Take 1 g by mouth 4 (four) times daily.    [DISCONTINUED] traZODone (DESYREL) 50 MG tablet SMARTSIG:1.0 Tablet(s) By Mouth Every Night    No facility-administered encounter medications on file as of 12/27/2023.    Surgical History: Past Surgical History:  Procedure Laterality Date   ADENOIDECTOMY     APPENDECTOMY     CERVICAL BIOPSY  W/ LOOP ELECTRODE EXCISION  2005   CHOLECYSTECTOMY     COLONOSCOPY N/A 10/16/2022   Procedure: COLONOSCOPY;  Surgeon: Regis Bill, MD;  Location: [facility] ENDOSCOPY;  Service: Gastroenterology;  Laterality: N/A;   ESOPHAGOGASTRODUODENOSCOPY N/A 10/16/2022   Procedure: ESOPHAGOGASTRODUODENOSCOPY (EGD);  Surgeon: Regis Bill, MD;  Location: [facility] ENDOSCOPY;  Service: Gastroenterology;  Laterality: N/A;   ESOPHAGOGASTRODUODENOSCOPY (EGD) WITH PROPOFOL N/A 08/27/2017   Procedure: ESOPHAGOGASTRODUODENOSCOPY (EGD) WITH PROPOFOL;  Surgeon: Scot Jun, MD;  Location: [facility] ENDOSCOPY;  Service: Endoscopy;  Laterality: N/A;   FRACTURE SURGERY  11/30/2008   HAND SURGERY     repair of left metatarsal    HARDWARE REMOVAL Right 02/16/2017   Procedure: HARDWARE REMOVAL FROM HIP;  Surgeon: Kennedy Bucker, MD;  Location: [facility] ORS;  Service: Orthopedics;  Laterality: Right;   JOINT REPLACEMENT     OTHER SURGICAL HISTORY     left 3rd metatarsal fracture repair 2011    REVISION TOTAL HIP ARTHROPLASTY  06/2009   replacement 2010 then screw and plate removal 0865; right hip   TONSILLECTOMY     TONSILLECTOMY     wrist  ligament repair bilateral     WRIST RECONSTRUCTION     WRIST SURGERY     laceration of right wrist ligaments   wrist surgery     laceration of left wrist surgery      Medical History: Past Medical History:  Diagnosis Date   ADHD    Allergy 12/30/2018   Seasonal   Anemia    ANXIETY 06/06/2007   Arthritis    related to lupus   Asthma    BACK PAIN 02/22/2009   Cervical cancer (HCC)    LEEP 2005    Chicken pox    Chronic pain    goes to pain management provider   Collagen vascular disease (HCC)    Connective tissue disease (HCC)    DEPRESSION 06/06/2007   Essential tremor    Fibromyalgia    Fibromyalgia    FREQUENCY, URINARY 07/07/2007   Gastritis    GERD 06/06/2007   Glaucoma    Headache    h/o migraines    History of hiatal hernia    History of kidney stones    HYPOTHYROIDISM 06/06/2007   INTERSTITIAL CYSTITIS 09/11/2010   Lupus 2006   Lupus    Menopause    Migraine    Mitral valve regurgitation    NEPHROLITHIASIS, HX OF 11/05/2010   Neuropathy    Neuropathy    OCD (obsessive compulsive disorder)    Optic neuritis    2005   OSTEOPENIA 10/14/2009   Osteoporosis    OVERACTIVE BLADDER 10/14/2009   PAIN, CHRONIC NEC 06/06/2007   Panniculitis    Parkinson disease (HCC) 04/03/2019   Parkinson disease (HCC)    POLYARTHRITIS 08/19/2007   Raynaud's disease    Sicca syndrome (HCC)    Ulcer 11/30/2017   Don't actually know date   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 Valentino Saxon [facility]    Vasculitis [facility])    WEIGHT GAIN 02/22/2009    Family History: Family History  Problem Relation Age of Onset   Hypertension Mother    Arthritis Mother    Heart disease Mother        afib   Asthma Mother    Asthma Sister    Asthma Daughter    Arthritis Daughter    Heart disease Daughter        ?heart condition on BB   ADD / ADHD Daughter    Depression Daughter    Intellectual disability Daughter    Learning disabilities Daughter    Pancreatic cancer Father    Cancer Father        pancreatitic    Diabetes  Maternal Grandmother    Heart disease Maternal Grandmother    Arthritis Maternal Grandmother     Hypertension Maternal Grandmother    Dementia Maternal Grandmother    Colon cancer Maternal Uncle 45   Crohn's disease Maternal Aunt    Breast cancer Maternal Aunt 26   Diabetes Maternal Aunt    Breast cancer Cousin 78       maternal   Cancer Cousin        m cousin breat cancer s/p removal both breasts     Social History   Socioeconomic History   Marital status: Divorced    Spouse name: Not on file   Number of children: 2   Years of education: Not on file   Highest education level: Professional school degree (e.g., MD, DDS, DVM, JD)  Occupational History   Occupation: DISABLED    Employer: UNEMPLOYED  Tobacco Use   Smoking status: Never   Smokeless tobacco: Never  Vaping Use   Vaping status: Never Used  Substance and Sexual Activity   Alcohol use: No   Drug use: No   Sexual activity: Not Currently    Birth control/protection: Post-menopausal, None  Other Topics Concern   Not on file  Social History Narrative   She was previously a pediatrician, 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     She lives with daughter and mother      1 living child    Social Drivers of Corporate investment banker Strain: Low Risk  (12/15/2023)   Received from [facility]   Overall Financial Resource Strain (CARDIA)    Difficulty of Paying Living Expenses: Not hard at all  Recent Concern: Financial Resource Strain - Medium Risk (11/18/2023)   Received from [facility]   Overall Financial Resource Strain (CARDIA)    Difficulty of Paying Living Expenses: Somewhat hard  Food Insecurity: No Food Insecurity (12/15/2023)   Received from [facility]   Hunger Vital Sign    Ran Out of Food in the Last Year: Never true    Worried About Running Out of Food in the Last Year: Never true  Recent Concern: Food Insecurity - Food Insecurity Present (11/18/2023)   Received  from [facility]   Hunger Vital Sign    Worried About Running Out of Food in the Last Year: Sometimes true    Ran Out of Food in the Last Year: Sometimes true  Transportation Needs: No Transportation Needs (12/15/2023)   Received from [facility]   PRAPARE - Transportation    Lack of Transportation (Non-Medical): No    In the past 12 months, has lack of transportation kept you from medical appointments or from getting medications?: No  Physical Activity: Inactive (11/18/2023)   Received from [facility]   Exercise Vital Sign    Minutes of Exercise per Session: 0 min    Days of Exercise per Week: 0 days  Stress: Stress Concern Present (11/18/2023)   Received from [facility] - Occupational Stress Questionnaire    Feeling of Stress : To some extent  Social Connections: Socially Isolated (11/18/2023)   Received from [facility]   Social Connection and Isolation Panel [NHANES]    Frequency of Communication with Friends and Family: Never    Frequency of Social Gatherings with  Friends and Family: Never    Attends Religious Services: Never    Database administrator or Organizations: No    Attends Banker Meetings: Never    Marital Status: Divorced  Catering manager Violence: Not At Risk (11/08/2023)   Humiliation, Afraid, Rape, and Kick questionnaire    Fear of Current or Ex-Partner: No    Emotionally Abused: No    Physically Abused: No    Sexually Abused: No     Review of Systems  Constitutional:  Positive for appetite change, fatigue and unexpected weight change. Negative for chills.  HENT:  Positive for postnasal drip. Negative for congestion, rhinorrhea, sneezing and sore throat.   Respiratory:  Positive for shortness of breath. Negative for chest tightness.   Cardiovascular:  Positive for palpitations. Negative for chest pain.   Gastrointestinal:  Positive for abdominal pain, diarrhea, nausea and vomiting. Negative for constipation.  Genitourinary:  Positive for frequency. Negative for dysuria.  Musculoskeletal:  Positive for arthralgias and back pain. Negative for joint swelling and neck pain.  Skin:  Negative for rash.  Neurological:  Positive for light-headedness, numbness and headaches.  Hematological:  Negative for adenopathy. Does not bruise/bleed easily.  Psychiatric/Behavioral:  Positive for dysphoric mood. Negative for suicidal ideas. Behavioral problem: Depression.The patient is nervous/anxious.     Vital Signs: BP (!) 142/92   Pulse 86   Temp 98 F (36.7 C)   Resp 16   Ht 5\' 8"  (1.727 m)   Wt 172 lb 12.8 oz (78.4 kg)   LMP 12/15/2015 Comment: neg urine test   SpO2 99%   BMI 26.27 kg/m    Physical Exam Vitals and nursing note reviewed.  Constitutional:      General: She is not in acute distress.    Appearance: Normal appearance. She is well-developed. She is not diaphoretic.  HENT:     Head: Normocephalic and atraumatic.     Mouth/Throat:     Pharynx: No oropharyngeal exudate.  Eyes:     Pupils: Pupils are equal, round, and reactive to light.  Neck:     Thyroid: No thyromegaly.     Vascular: No JVD.     Trachea: No tracheal deviation.  Cardiovascular:     Rate and Rhythm: Normal rate and regular rhythm.     Heart sounds: Normal heart sounds. No murmur heard.    No friction rub. No gallop.  Pulmonary:     Effort: Pulmonary effort is normal. No respiratory distress.     Breath sounds: No wheezing or rales.  Chest:     Chest wall: No tenderness.  Musculoskeletal:     Cervical back: Normal range of motion and neck supple.  Lymphadenopathy:     Cervical: No cervical adenopathy.  Skin:    General: Skin is warm and dry.  Neurological:     Mental Status: She is alert.  Psychiatric:        Thought Content: Thought content normal.        Judgment: Judgment normal.        Assessment/Plan: 1. Nausea vomiting and diarrhea (Primary) Ongoing for almost 2 years. Pt currently seeing GI provider out in [place] and would like referral to [facility] for more local care. Pt ideally would like neurogastroenterology, but unable to find specific referral locally - Ambulatory referral to Gastroenterology  2. Appetite impaired Ongoing, will place new referral - Ambulatory referral to Gastroenterology  3. Chronic abdominal pain Ongoing for years, will place new referral - Ambulatory referral  to Gastroenterology  4. Gastroesophageal reflux disease without esophagitis - Ambulatory referral to Gastroenterology  5. Unintended weight loss - Ambulatory referral to Gastroenterology  6. Anxiety and depression Established with psychiatry, but needs referral for psychologist - Ambulatory referral to Psychology  7. Systemic lupus erythematosus, unspecified SLE type, unspecified organ involvement status (HCC) Will be establishing with new rheumatologist next month  8. Encounter to establish care with new doctor Reviewed medical hx   General Counseling: Jaana verbalizes understanding of the findings of todays visit and agrees with plan of treatment. I have discussed any further diagnostic evaluation that may be needed or ordered today. We also reviewed her medications today. she has been encouraged to call the office with any questions or concerns that should arise related to todays visit.    Counseling:    Orders Placed This Encounter  Procedures   Ambulatory referral to Psychology   Ambulatory referral to Gastroenterology    No orders of the defined types were placed in this encounter.    This patient was seen by Lynn Ito, PA-C in collaboration with Dr. Beverely Risen as a part of collaborative care agreement.   Time spent:45 Minutes

## 2023-12-27 NOTE — Telephone Encounter (Signed)
Awaiting 12/27/23 office notes for Psychology & GI referral-Toni

## 2023-12-28 DIAGNOSIS — F411 Generalized anxiety disorder: Secondary | ICD-10-CM | POA: Diagnosis not present

## 2023-12-28 DIAGNOSIS — F331 Major depressive disorder, recurrent, moderate: Secondary | ICD-10-CM | POA: Diagnosis not present

## 2023-12-28 DIAGNOSIS — F429 Obsessive-compulsive disorder, unspecified: Secondary | ICD-10-CM | POA: Diagnosis not present

## 2023-12-30 ENCOUNTER — Encounter: Payer: Self-pay | Admitting: *Deleted

## 2024-01-03 DIAGNOSIS — M359 Systemic involvement of connective tissue, unspecified: Secondary | ICD-10-CM | POA: Diagnosis not present

## 2024-01-04 ENCOUNTER — Encounter: Payer: Self-pay | Admitting: Physician Assistant

## 2024-01-04 DIAGNOSIS — M359 Systemic involvement of connective tissue, unspecified: Secondary | ICD-10-CM | POA: Diagnosis not present

## 2024-01-05 ENCOUNTER — Encounter (HOSPITAL_COMMUNITY): Payer: Self-pay | Admitting: Emergency Medicine

## 2024-01-05 ENCOUNTER — Emergency Department (HOSPITAL_COMMUNITY)
Admission: EM | Admit: 2024-01-05 | Discharge: 2024-01-05 | Discharge status: Against Medical Advice | Payer: Medicare Other | Attending: Emergency Medicine | Admitting: Emergency Medicine

## 2024-01-05 ENCOUNTER — Emergency Department (HOSPITAL_BASED_OUTPATIENT_CLINIC_OR_DEPARTMENT_OTHER)
Admission: EM | Admit: 2024-01-05 | Discharge: 2024-01-05 | Disposition: A | Discharge status: Home / Self Care | Payer: Medicare Other | Source: Home / Self Care | Attending: Emergency Medicine | Admitting: Emergency Medicine

## 2024-01-05 ENCOUNTER — Other Ambulatory Visit: Payer: Self-pay

## 2024-01-05 ENCOUNTER — Encounter (HOSPITAL_BASED_OUTPATIENT_CLINIC_OR_DEPARTMENT_OTHER): Payer: Self-pay | Admitting: Emergency Medicine

## 2024-01-05 ENCOUNTER — Telehealth: Payer: Self-pay

## 2024-01-05 ENCOUNTER — Ambulatory Visit: Payer: Self-pay

## 2024-01-05 ENCOUNTER — Emergency Department (HOSPITAL_COMMUNITY): Payer: Medicare Other

## 2024-01-05 ENCOUNTER — Ambulatory Visit: Payer: Medicare Other | Admitting: Nurse Practitioner

## 2024-01-05 DIAGNOSIS — R002 Palpitations: Secondary | ICD-10-CM | POA: Insufficient documentation

## 2024-01-05 DIAGNOSIS — M79601 Pain in right arm: Secondary | ICD-10-CM | POA: Insufficient documentation

## 2024-01-05 DIAGNOSIS — R110 Nausea: Secondary | ICD-10-CM | POA: Insufficient documentation

## 2024-01-05 DIAGNOSIS — M79602 Pain in left arm: Secondary | ICD-10-CM | POA: Diagnosis not present

## 2024-01-05 DIAGNOSIS — R5383 Other fatigue: Secondary | ICD-10-CM | POA: Insufficient documentation

## 2024-01-05 DIAGNOSIS — Z91010 Allergy to peanuts: Secondary | ICD-10-CM | POA: Insufficient documentation

## 2024-01-05 DIAGNOSIS — Z91040 Latex allergy status: Secondary | ICD-10-CM | POA: Insufficient documentation

## 2024-01-05 DIAGNOSIS — I10 Essential (primary) hypertension: Secondary | ICD-10-CM | POA: Diagnosis not present

## 2024-01-05 DIAGNOSIS — Z5321 Procedure and treatment not carried out due to patient leaving prior to being seen by health care provider: Secondary | ICD-10-CM | POA: Diagnosis not present

## 2024-01-05 DIAGNOSIS — R42 Dizziness and giddiness: Secondary | ICD-10-CM | POA: Insufficient documentation

## 2024-01-05 DIAGNOSIS — R531 Weakness: Secondary | ICD-10-CM | POA: Insufficient documentation

## 2024-01-05 DIAGNOSIS — G4489 Other headache syndrome: Secondary | ICD-10-CM | POA: Diagnosis not present

## 2024-01-05 LAB — HEPATIC FUNCTION PANEL
ALT: 13 U/L (ref 0–44)
AST: 16 U/L (ref 15–41)
Albumin: 4.9 g/dL (ref 3.5–5.0)
Alkaline Phosphatase: 67 U/L (ref 38–126)
Bilirubin, Direct: 0.2 mg/dL (ref 0.0–0.2)
Indirect Bilirubin: 0.4 mg/dL (ref 0.3–0.9)
Total Bilirubin: 0.6 mg/dL (ref 0.0–1.2)
Total Protein: 7.4 g/dL (ref 6.5–8.1)

## 2024-01-05 LAB — BASIC METABOLIC PANEL
Anion gap: 10 (ref 5–15)
Anion gap: 9 (ref 5–15)
BUN: 11 mg/dL (ref 6–20)
BUN: 10 mg/dL (ref 6–20)
CO2: 26 mmol/L (ref 22–32)
CO2: 26 mmol/L (ref 22–32)
Calcium: 9.4 mg/dL (ref 8.9–10.3)
Calcium: 9.5 mg/dL (ref 8.9–10.3)
Chloride: 102 mmol/L (ref 98–111)
Chloride: 104 mmol/L (ref 98–111)
Creatinine, Ser: 0.95 mg/dL (ref 0.44–1.00)
Creatinine, Ser: 0.8 mg/dL (ref 0.44–1.00)
GFR, Estimated: >60 mL/min (ref >60)
GFR, Estimated: >60 mL/min (ref >60)
Glucose, Bld: 104 mg/dL — ABNORMAL HIGH (ref 70–99)
Glucose, Bld: 92 mg/dL (ref 70–99)
Potassium: 3.7 mmol/L (ref 3.5–5.1)
Potassium: 3.7 mmol/L (ref 3.5–5.1)
Sodium: 138 mmol/L (ref 135–145)
Sodium: 139 mmol/L (ref 135–145)

## 2024-01-05 LAB — CBC
HCT: 43.4 % (ref 36.0–46.0)
Hemoglobin: 14.2 g/dL (ref 12.0–15.0)
MCH: 28.3 pg (ref 26.0–34.0)
MCHC: 32.7 g/dL (ref 30.0–36.0)
MCV: 86.6 fL (ref 80.0–100.0)
Platelets: 170 10*3/uL (ref 150–400)
RBC: 5.01 MIL/uL (ref 3.87–5.11)
RDW: 12.8 % (ref 11.5–15.5)
WBC: 5.2 10*3/uL (ref 4.0–10.5)
nRBC: 0 % (ref 0.0–0.2)

## 2024-01-05 LAB — CBC WITH DIFFERENTIAL/PLATELET
Abs Immature Granulocytes: 0.01 10*3/uL (ref 0.00–0.07)
Basophils Absolute: 0 10*3/uL (ref 0.0–0.1)
Basophils Relative: 0 %
Eosinophils Absolute: 0.1 10*3/uL (ref 0.0–0.5)
Eosinophils Relative: 1 %
HCT: 43.7 % (ref 36.0–46.0)
Hemoglobin: 14.6 g/dL (ref 12.0–15.0)
Immature Granulocytes: 0 %
Lymphocytes Relative: 30 %
Lymphs Abs: 1.6 10*3/uL (ref 0.7–4.0)
MCH: 28.7 pg (ref 26.0–34.0)
MCHC: 33.4 g/dL (ref 30.0–36.0)
MCV: 85.9 fL (ref 80.0–100.0)
Monocytes Absolute: 0.3 10*3/uL (ref 0.1–1.0)
Monocytes Relative: 6 %
Neutro Abs: 3.3 10*3/uL (ref 1.7–7.7)
Neutrophils Relative %: 63 %
Platelets: 193 10*3/uL (ref 150–400)
RBC: 5.09 MIL/uL (ref 3.87–5.11)
RDW: 12.7 % (ref 11.5–15.5)
WBC: 5.3 10*3/uL (ref 4.0–10.5)
nRBC: 0 % (ref 0.0–0.2)

## 2024-01-05 LAB — MAGNESIUM: Magnesium: 2.2 mg/dL (ref 1.7–2.4)

## 2024-01-05 LAB — TSH: TSH: 2.987 u[IU]/mL (ref 0.350–4.500)

## 2024-01-05 MED ORDER — METOCLOPRAMIDE HCL 5 MG/ML IJ SOLN
10.0000 mg | Freq: Once | INTRAMUSCULAR | Status: AC
  Administered 2024-01-05: 10 mg via INTRAVENOUS
  Filled 2024-01-05: qty 2

## 2024-01-05 MED ORDER — LACTATED RINGERS IV BOLUS
1000.0000 mL | Freq: Once | INTRAVENOUS | Status: AC
  Administered 2024-01-05: 1000 mL via INTRAVENOUS

## 2024-01-05 MED ORDER — DIPHENHYDRAMINE HCL 25 MG PO CAPS
25.0000 mg | ORAL_CAPSULE | Freq: Once | ORAL | Status: AC
  Administered 2024-01-05: 25 mg via ORAL
  Filled 2024-01-05: qty 1

## 2024-01-05 NOTE — Telephone Encounter (Signed)
  Chief Complaint: weakness Symptoms: chest and abd pain, nausea, tearful, severe headache Frequency: last night  Disposition: [x] ED /[] Urgent Care (no appt availability in office) / [] Appointment(In office/virtual)/ []  [place] [facility]/ [] Home Care/ [] Refused Recommended Disposition /[] [facility]/ []  Follow-up with PCP Additional Notes: accidentally hung up on pt during attempt to place her on hold to call 911. Called pt back on number listed in chart and kept getting recording your call cannot be completed as dialed Reason for Disposition  Patient sounds very sick or weak to the triager  Answer Assessment - Initial Assessment Questions 1. DESCRIPTION: Please describe your heart rate or heartbeat that you are having (e.g., fast/slow, regular/irregular, skipped or extra beats, palpitations)     Fast- feels like it is beating out of chest/fast 2. ONSET: When did it start? (Minutes, hours or days)      Last night  3. DURATION: How long does it last (e.g., seconds, minutes, hours)     Constant  4. PATTERN Does it come and go, or has it been constant since it started?  Does it get worse with exertion?   Are you feeling it now?     Constant - worse with exercise makes lightheadedness worse 5. TAP: Using your hand, can you tap out what you are feeling on a chair or table in front of you, so that I can hear? (Note: not all patients can do this)       no 6. HEART RATE: Can you tell me your heart rate? How many beats in 15 seconds?  (Note: not all patients can do this)       Unable to check  7. RECURRENT SYMPTOM: Have you ever had this before? If Yes, ask: When was the last time? and What happened that time?      yes 8. CAUSE: What do you think is causing the palpitations?     unsure 9. CARDIAC HISTORY: Do you have any history of heart disease? (e.g., heart attack, angina, bypass surgery, angioplasty, arrhythmia)      *No Answer* 10. OTHER  SYMPTOMS: Do you have any other symptoms? (e.g., dizziness, chest pain, sweating, difficulty breathing)       New weakness, nausea, lightheaded, off ann chest pain seen by cardiologist,mid abdomen pain and below diaphragm  11. PREGNANCY: Is there any chance you are pregnant? When was your last menstrual period?       N/a  Protocols used: Heart Rate and Heartbeat Questions-A-AH

## 2024-01-05 NOTE — ED Triage Notes (Addendum)
 Patient complaining of palpations, bilateral upper and lower body weakness, shortness of breath, and headache for several hours. Hx of lupus.

## 2024-01-05 NOTE — ED Provider Triage Note (Signed)
 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  Pt complains of palpitations and heaviness in her extremities.  She has a longstanding history of chronic health issues include chronic daily headaches, chronic diarrhea, chronic abdominal pain, chronic generalized weakness, lupus.  She saw her rheumatologist today who took complements and adrenal cortisol levels looking for potential Addison's disease.  She is ambulatory.  No recent interval illnesses or vaccinations.  Is also having palpitations  Review of Systems  Positive: Heaviness in her arms Negative: Other neurologic deficits  Physical Exam  BP 130/72   Pulse 74   Temp 98.3 F (36.8 C)   Resp 16   Ht 5' 8 (1.727 m)   Wt 76.2 kg   LMP 12/15/2015 Comment: neg urine test   SpO2 100%   BMI 25.54 kg/m  Gen:   Awake, no distress   Resp:  Normal effort  MSK:   Moves extremities without difficulty  Other:  Equal grip strengths, poor effort, reflexes 3+ bilaterally in the patella  Medical Decision Making  Medically screening exam initiated at 1:41 AM.  Appropriate orders placed.  [name] Evern Sportsman, MD was informed that the remainder of the evaluation will be completed by another provider, this initial triage assessment does not replace that evaluation, and the importance of remaining in the ED until their evaluation is complete.     Arloa Chroman, PA-C 01/05/24 [phone]

## 2024-01-05 NOTE — Telephone Encounter (Signed)
 Spoke with pt she went to Ed last night not stay too long she having weakness ,abdominal pain  and shortness due to we have medical emergency with out provider  advised her go to urgent care or [facility] ED

## 2024-01-05 NOTE — ED Triage Notes (Signed)
 Brought in by [facility] from home for eval of dizziness, weakness, palpitations, nausea, headache, and abd pain. Was at [facility] ED yesterday and LWBS. Uses a cane. FSBS 107.

## 2024-01-05 NOTE — ED Notes (Signed)
 Brought in to triage for re check.  Blood work drawn with use of ultrasound.  VS updated

## 2024-01-05 NOTE — ED Notes (Signed)
Pt called for roll call with no answer

## 2024-01-05 NOTE — ED Notes (Signed)
Discharge instructions, follow up care, and pain management reviewed and explained, pt verbalized understanding and had no further questions on d/c.  

## 2024-01-05 NOTE — ED Triage Notes (Signed)
 Dizziness and reports irregular heart beats  Sent by EMS Seen last night at [facility] Recent treatments for lupus Headache Feels like it got worse since yesterday

## 2024-01-05 NOTE — ED Provider Notes (Signed)
 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   Dizziness  Patient is a 58 year old female with a past medical history significant for lupus, fibromyalgia, chronic headaches, diarrhea, abdominal pain, weakness  She states that she is not feeling well today.  She states that she had a blood draw done this morning to check her cortisol levels and states that she was feeling weak and fatigued prior to her blood draw this morning.  She states that she feels nauseous.  She also has palpitations however she states that this is not a new symptom for her.  She states that she has not had any fevers or chills no chest pain or difficulty breathing.  She does endorse some abdominal pain and states that it is diffuse and points that her entire abdomen.     Home Medications Prior to Admission medications   Medication Sig Start Date End Date Taking? Authorizing Provider  albuterol  (PROVENTIL ) (2.5 MG/3ML) 0.083% nebulizer solution Take 3 mLs (2.5 mg total) by nebulization every 6 (six) hours as needed for wheezing or shortness of breath. 08/11/22   McLean-Scocuzza, Randine SAILOR, MD  albuterol  (VENTOLIN  HFA) 108 (90 Base) MCG/ACT inhaler INHALE 1 TO 2 PUFFS INTO THE LUNGS EVERY 4 HOURS AS NEEDED FOR WHEEZING OR SHORTNESS OF BREATH 07/30/23   Dineen Rollene MATSU, FNP  bismuth subsalicylate (PEPTO BISMOL) 262 MG chewable tablet Chew 524 mg by mouth as needed.    [provider]  calcium carbonate (TUMS EX) 750 MG chewable tablet Chew 1 tablet by mouth 3 (three) times daily as needed for heartburn.    [provider]  carbidopa-levodopa (SINEMET CR) 50-200 MG tablet Take 1 tablet by mouth 3 (three) times daily. 09/12/21   [provider]  colestipol  (COLESTID ) 1 g  tablet Take 3 tablets (3 g total) by mouth 2 (two) times daily. Patient taking differently: Take 4 g by mouth 2 (two) times daily. 10/18/23   Manya Toribio SQUIBB, PA  dexlansoprazole  (DEXILANT ) 60 MG capsule Take 1 capsule (60 mg total) by mouth daily. 09/02/23   Manya Toribio SQUIBB, PA  diazepam  (VALIUM ) 5 MG tablet Take 2.5 mg by mouth 2 (two) times daily as needed for anxiety.    [provider]  diclofenac  sodium (VOLTAREN ) 1 % GEL Apply 2 g topically 4 (four) times daily as needed (for knee & finger pain.).     [provider]  dicyclomine  (BENTYL ) 20 MG tablet Take 1 tablet (20 mg total) by mouth every 6 (six) hours. Patient taking differently: Take 20 mg by mouth every 6 (six) hours. Pt is taking 3 times a daily 12/09/22   Dineen Rollene MATSU, FNP  DULoxetine  (CYMBALTA ) 60 MG capsule Take 1 capsule (60 mg total) by mouth 2 (two) times daily. 08/09/23   Dineen Rollene MATSU, FNP  EPINEPHrine  0.3 mg/0.3 mL IJ SOAJ injection Inject 0.3 mg into the muscle as needed for anaphylaxis. 08/11/22   McLean-Scocuzza, Randine SAILOR, MD  fluticasone -salmeterol (ADVAIR DISKUS) 250-50 MCG/ACT AEPB Inhale 1 puff into the lungs in the morning and at bedtime. Rinse mouth after use 11/05/23   Manya Toribio SQUIBB, PA  HYDROcodone -acetaminophen  (NORCO/VICODIN) 5-325 MG tablet Take 1 tablet by mouth 2 (two) times daily as needed. Take 2xs  daily 12/16/22   [provider]  hydroxychloroquine  (PLAQUENIL ) 200 MG tablet Plaquenil  200 mg tablet  Take 1 tablet twice a day by oral route after meals for 90 days.    [provider]  lactase (LACTAID) 3000 units tablet Take 3,000 Units by mouth 3 (three) times daily with meals.    [provider]  latanoprost (XALATAN) 0.005 % ophthalmic solution 1 drop at bedtime. 03/11/23   [provider]  levothyroxine  (SYNTHROID ) 75 MCG tablet TAKE 1 TABLET BY MOUTH DAILY 30 MINUTES BEFORE BREAKFAST 11/03/23   Manya Toribio SQUIBB, PA  metoCLOPramide  (REGLAN )  5 MG tablet Take 1 tablet (5 mg total) by mouth every 6 (six) hours as needed for nausea. 11/09/23   Manya Toribio SQUIBB, PA  montelukast  (SINGULAIR ) 10 MG tablet TAKE 1 TABLET BY MOUTH EVERY NIGHT AT BEDTIME 11/03/23   Manya Toribio SQUIBB, PA  ondansetron  (ZOFRAN -ODT) 8 MG disintegrating tablet TAKE 1 TABLET BY MOUTH EVERY 8 HOURS AS NEEDED FOR NAUSEA OR VOMTING 10/06/23   Manya Toribio SQUIBB, PA  PAXIL  20 MG tablet Take 20 mg by mouth daily. 08/18/23   [provider]  topiramate  (TOPAMAX ) 50 MG tablet Take 3 tablets (150 mg total) by mouth 2 (two) times daily. 11/05/23   Manya Toribio SQUIBB, PA  triamcinolone  ointment (KENALOG ) 0.5 % Apply 1 Application topically 2 (two) times daily. 08/09/23   Dineen Rollene MATSU, FNP  trihexyphenidyl (ARTANE) 2 MG tablet Take by mouth.    [provider]  Zoledronic  Acid (RECLAST  IV)  12/01/17   [provider]      Allergies    Bee venom, Fish allergy, Latex, Peanut (diagnostic), Peanut-containing drug products, Prasterone, Shellfish allergy, Shellfish-derived products, Compazine [prochlorperazine edisylate], Dhea [nutritional supplements], Dilaudid  [hydromorphone  hcl], Famotidine , Gabapentin , Lactose, Lactose intolerance (gi), Other, Prochlorperazine, Promethazine, Promethazine hcl, Reclast  [zoledronic  acid], Clarithromycin, Clotrimazole, Hydromorphone  hcl, and Penicillins    Review of Systems   Review of Systems  Neurological:  Positive for dizziness.    Physical Exam Updated Vital Signs BP 127/69   Pulse 72   Temp 98.2 F (36.8 C) (Oral)   Resp 19   LMP 12/15/2015 Comment: neg urine test   SpO2 97%  Physical Exam Vitals and nursing note reviewed.  Constitutional:      General: She is not in acute distress. HENT:     Head: Normocephalic and atraumatic.     Nose: Nose normal.     Mouth/Throat:     Mouth: Mucous membranes are dry.  Eyes:     General: No scleral icterus. Cardiovascular:     Rate and Rhythm: Normal rate and  regular rhythm.     Pulses: Normal pulses.     Heart sounds: Normal heart sounds.  Pulmonary:     Effort: Pulmonary effort is normal. No respiratory distress.     Breath sounds: No wheezing.  Abdominal:     Palpations: Abdomen is soft.     Tenderness: There is no abdominal tenderness. There is no guarding or rebound.     Comments: Patient with no focal abdominal tenderness.  No guarding or rebound  Musculoskeletal:     Cervical back: Normal range of motion.     Right lower leg: No edema.     Left lower leg: No edema.  Skin:    General: Skin is warm and dry.     Capillary Refill: Capillary refill takes less than 2 seconds.  Neurological:     Mental Status: She is alert.  Mental status is at baseline.  Psychiatric:        Mood and Affect: Mood normal.        Behavior: Behavior normal.     ED Results / Procedures / Treatments   Labs (all labs ordered are listed, but only abnormal results are displayed) Labs Reviewed  CBC WITH DIFFERENTIAL/PLATELET  BASIC METABOLIC PANEL  HEPATIC FUNCTION PANEL    EKG EKG Interpretation Date/Time:  Wednesday January 05 2024 14:55:25 EST Ventricular Rate:  91 PR Interval:  138 QRS Duration:  90 QT Interval:  381 QTC Calculation: 469 R Axis:   49  Text Interpretation: Sinus rhythm Low voltage, precordial leads Confirmed by Tonia Chew [phone]) on 01/05/2024 3:42:46 PM  Radiology No results found.  Procedures Procedures    Medications Ordered in ED Medications  metoCLOPramide  (REGLAN ) injection 10 mg (10 mg Intravenous Given 01/05/24 1600)  diphenhydrAMINE  (BENADRYL ) capsule 25 mg (25 mg Oral Given 01/05/24 1543)  lactated ringers  bolus 1,000 mL (0 mLs Intravenous Stopped 01/05/24 1704)    ED Course/ Medical Decision Making/ A&P                                  Medical Decision Making Amount and/or Complexity of Data Reviewed Labs: ordered.  Risk Prescription drug management.   Patient is a 58 year old female with a past  medical history significant for lupus, fibromyalgia, chronic headaches, diarrhea, abdominal pain, weakness  She states that she is not feeling well today.  She states that she had a blood draw done this morning to check her cortisol levels and states that she was feeling weak and fatigued prior to her blood draw this morning.  She states that she feels nauseous.  She also has palpitations however she states that this is not a new symptom for her.  She states that she has not had any fevers or chills no chest pain or difficulty breathing.  She does endorse some abdominal pain and states that it is diffuse and points that her entire abdomen.  PE: unremarkable.  Labs: LFTs, BMP, CBC unremarkable.   Relatively unremarkable EGD.  No evidence of ischemia no explanation for patient's palpitations.  I am reassured by her workup will discharge home she is agreeable to plan.  Final Clinical Impression(s) / ED Diagnoses Final diagnoses:  Nausea  Other fatigue    Rx / DC Orders ED Discharge Orders     None         Neldon Hamp RAMAN, [place] 01/05/24 1758    Cottie Donnice PARAS, MD 01/06/24 [phone]

## 2024-01-05 NOTE — Discharge Instructions (Signed)
Please use Zofran and Reglan as needed at home for nausea.  Please follow-up with your primary care doctor.  Make sure you are drinking plenty of water eat regular meals.

## 2024-01-10 ENCOUNTER — Telehealth: Payer: Self-pay | Admitting: Physician Assistant

## 2024-01-10 NOTE — Telephone Encounter (Signed)
 GI referral faxed to [facility]; [phone]. Notified patient. Gave pt telephone [phone]

## 2024-01-11 ENCOUNTER — Telehealth: Payer: Self-pay | Admitting: *Deleted

## 2024-01-11 NOTE — Progress Notes (Unsigned)
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  Follow up plan: Unsuccessful telephone outreach attempt made. A HIPAA compliant phone message was left for the patient providing contact information and requesting a return call.  Burman Nieves, CMA, Care Guide [facility]  Outpatient Carecenter, [facility] Guide Direct Dial: [phone]  Fax: [phone] Website: [email or web address]

## 2024-01-12 ENCOUNTER — Telehealth: Payer: Self-pay | Admitting: Physician Assistant

## 2024-01-12 NOTE — Telephone Encounter (Signed)
Patient called yesterday stating [facility] did not receive my fax and that she requested a neurogastroenterology referral from Lauren. After discussing with Lauren, I lvm for patient letting her know there are not any neurogastroenterologist in the state of [place] and if she would just like for me to refax referral to [facility]

## 2024-01-12 NOTE — Progress Notes (Signed)
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 I reached out to [name], MD by phone today to assist with re-scheduling  with the Licensed Clinical Social Worker.  Follow up plan: Telephone appointment with complex care management team member scheduled for:  01/21/2024  Burman Nieves, CMA, Care Guide [facility], [facility] Guide Direct Dial: [phone]  Fax: [phone] Website: [email or web address]

## 2024-01-17 ENCOUNTER — Other Ambulatory Visit: Payer: Self-pay

## 2024-01-17 ENCOUNTER — Other Ambulatory Visit: Payer: Self-pay | Admitting: Physician Assistant

## 2024-01-17 ENCOUNTER — Encounter: Payer: Self-pay | Admitting: Orthopedic Surgery

## 2024-01-17 DIAGNOSIS — G43909 Migraine, unspecified, not intractable, without status migrainosus: Secondary | ICD-10-CM

## 2024-01-17 DIAGNOSIS — R110 Nausea: Secondary | ICD-10-CM

## 2024-01-21 ENCOUNTER — Ambulatory Visit: Payer: Self-pay | Admitting: *Deleted

## 2024-01-22 NOTE — Patient Outreach (Signed)
 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 I spoke with  [name] Putt, MD by phone on 01/21/24.  What matters to the patients health and wellness today?  Mental Health and primary care follow up    Goals Addressed             This Visit's Progress    referral to a mental health therapist       Activities and task to complete in order to accomplish goals.   EMOTIONAL / MENTAL HEALTH SUPPORT Keep all upcoming appointments discussed today Self Support options  ( follow up with psychiatrist, through [facility] regarding referral for the Psychologist-CSW will follow up as well to assist with scheduling) Please contact the [facility] [phone] to discuss furniture/items donation          SDOH assessments and interventions completed:  No     Care Coordination Interventions:  Yes, provided  Interventions Today    Flowsheet Row Most Recent Value  Chronic Disease   Chronic disease during today's visit Other  [Parkinsons, Lupus, hypothyroidism]  General Interventions   General Interventions Discussed/Reviewed General Interventions Reviewed, Doctor Visits  [Patient discussed recent visit to the ED due to feeling weak and fatigued]  Doctor Visits Discussed/Reviewed Doctor Visits Reviewed, PCP  [initial appt with PCP 12/27/23]  Mental Health Interventions   Mental Health Discussed/Reviewed Mental Health Discussed, Coping Strategies  [Emotional support provided around patient's medical challeges and limited explanation of the cause of her illness. Ongoing MH follow up enocuraged-pt currentluy followed by [facility] pending with psychologist-CSW will check status]       Follow up plan: Follow up call scheduled for 02/07/24     Encounter Outcome:  Patient Visit Completed

## 2024-01-22 NOTE — Patient Instructions (Signed)
 Visit Information  Thank you for taking time to visit with me today. Please don't hesitate to contact me if I can be of assistance to you.   Following are the goals we discussed today:   Goals Addressed             This Visit's Progress    referral to a mental health therapist       Activities and task to complete in order to accomplish goals.   EMOTIONAL / MENTAL HEALTH SUPPORT Keep all upcoming appointments discussed today Self Support options  ( follow up with psychiatrist, through [facility] regarding referral for the Psychologist-CSW will follow up as well to assist with scheduling) Please contact the [facility] [phone] to discuss furniture/items donation          Our next appointment is by telephone on 02/07/24 at 11am  Please call the care guide team at [phone] if you need to cancel or reschedule your appointment.   If you are experiencing a Mental Health or Behavioral Health Crisis or need someone to talk to, please call the Suicide and Crisis Lifeline: 988   Patient verbalizes understanding of instructions and care plan provided today and agrees to view in MyChart. Active MyChart status and patient understanding of how to access instructions and care plan via MyChart confirmed with patient.     Telephone follow up appointment with care management team member scheduled for: 02/07/24  Verna Czech, LCSW [place]  [facility], [facility] Licensed Clinical Social Worker Care Coordinator  Direct Dial: [phone]

## 2024-01-24 ENCOUNTER — Other Ambulatory Visit: Payer: Self-pay | Admitting: Physician Assistant

## 2024-01-24 ENCOUNTER — Ambulatory Visit: Payer: Medicare Other | Admitting: Podiatry

## 2024-01-24 ENCOUNTER — Encounter: Payer: Self-pay | Admitting: Physician Assistant

## 2024-01-24 ENCOUNTER — Encounter: Payer: Self-pay | Admitting: Podiatry

## 2024-01-24 DIAGNOSIS — M2042 Other hammer toe(s) (acquired), left foot: Secondary | ICD-10-CM | POA: Diagnosis not present

## 2024-01-24 DIAGNOSIS — R112 Nausea with vomiting, unspecified: Secondary | ICD-10-CM

## 2024-01-24 DIAGNOSIS — R634 Abnormal weight loss: Secondary | ICD-10-CM

## 2024-01-24 DIAGNOSIS — M2041 Other hammer toe(s) (acquired), right foot: Secondary | ICD-10-CM | POA: Diagnosis not present

## 2024-01-24 DIAGNOSIS — D2372 Other benign neoplasm of skin of left lower limb, including hip: Secondary | ICD-10-CM | POA: Diagnosis not present

## 2024-01-24 DIAGNOSIS — G8929 Other chronic pain: Secondary | ICD-10-CM

## 2024-01-24 DIAGNOSIS — M778 Other enthesopathies, not elsewhere classified: Secondary | ICD-10-CM

## 2024-01-24 NOTE — Progress Notes (Signed)
 She presents today chief complaint of painful calluses plantar aspect of the bilateral foot.  She states that her foot deformity is worsening as is her Parkinson's disease and or other neuromuscular disease that no one knows how to treat.  Pulses are palpable.  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  She has reactive hyperkeratotic lesions plantar aspect of the forefoot bilaterally.  These are benign no open lesions or wounds.  Assessment: Pain limb secondary to severe foot deformity and painful skin lesions.  Plan: Debridement of skin lesions.

## 2024-01-24 NOTE — Telephone Encounter (Signed)
 Labs order and gave to Pt in person

## 2024-01-27 DIAGNOSIS — R109 Unspecified abdominal pain: Secondary | ICD-10-CM | POA: Diagnosis not present

## 2024-01-27 DIAGNOSIS — R112 Nausea with vomiting, unspecified: Secondary | ICD-10-CM | POA: Diagnosis not present

## 2024-01-27 DIAGNOSIS — R197 Diarrhea, unspecified: Secondary | ICD-10-CM | POA: Diagnosis not present

## 2024-01-27 DIAGNOSIS — G8929 Other chronic pain: Secondary | ICD-10-CM | POA: Diagnosis not present

## 2024-01-27 DIAGNOSIS — M810 Age-related osteoporosis without current pathological fracture: Secondary | ICD-10-CM | POA: Diagnosis not present

## 2024-01-27 DIAGNOSIS — R110 Nausea: Secondary | ICD-10-CM | POA: Diagnosis not present

## 2024-01-27 DIAGNOSIS — R42 Dizziness and giddiness: Secondary | ICD-10-CM | POA: Diagnosis not present

## 2024-01-29 LAB — HEAVY METALS, BLOOD
Arsenic: 2 ug/L (ref 0–9)
Lead, Blood: <1 ug/dL (ref 0.0–3.4)
Mercury: <1 ug/L (ref 0.0–14.9)

## 2024-01-31 ENCOUNTER — Encounter: Payer: Self-pay | Admitting: Physician Assistant

## 2024-01-31 ENCOUNTER — Ambulatory Visit (INDEPENDENT_AMBULATORY_CARE_PROVIDER_SITE_OTHER): Admitting: Physician Assistant

## 2024-01-31 ENCOUNTER — Other Ambulatory Visit: Payer: Self-pay | Admitting: Family

## 2024-01-31 VITALS — BP 131/85 | HR 71 | Temp 97.9°F | Resp 16 | Ht 68.0 in | Wt 171.6 lb

## 2024-01-31 DIAGNOSIS — N390 Urinary tract infection, site not specified: Secondary | ICD-10-CM

## 2024-01-31 DIAGNOSIS — R300 Dysuria: Secondary | ICD-10-CM | POA: Diagnosis not present

## 2024-01-31 DIAGNOSIS — F32A Depression, unspecified: Secondary | ICD-10-CM

## 2024-01-31 DIAGNOSIS — F419 Anxiety disorder, unspecified: Secondary | ICD-10-CM

## 2024-01-31 LAB — POCT URINALYSIS DIPSTICK
Bilirubin, UA: NEGATIVE
Blood, UA: NEGATIVE
Glucose, UA: NEGATIVE
Ketones, UA: POSITIVE
Nitrite, UA: NEGATIVE
Protein, UA: NEGATIVE
Spec Grav, UA: 1.01 (ref 1.010–1.025)
Urobilinogen, UA: 0.2 U/dL
pH, UA: 7.5 (ref 5.0–8.0)

## 2024-01-31 MED ORDER — NITROFURANTOIN MONOHYD MACRO 100 MG PO CAPS: ORAL_CAPSULE | ORAL | 0 refills | Status: DC

## 2024-01-31 NOTE — Progress Notes (Signed)
 [facility] [address] [place], [place] [address]  Internal MEDICINE  Office Visit Note  Patient Name: [name] [name], [place]ac[place]-Carmen  [id number]  [id number]  Date of Service: 02/15/2024  Chief Complaint  Patient presents with   Acute Visit   Urinary Tract Infection    Chills, flank pain, urine frequency     HPI Pt is here for a sick visit. -Bilateral flank pain, urgency, frequency, chills. She has hx of UTI and states she has urology appt next month -chronic GI issues still ongoing, has reduced appetite, nausea and diarrhea at times. Followed by GI and has been referred to another location as well -Does have headaches ongoing as well -did have some carnation breakfast drink and water this morning  Current Medication:  Outpatient Encounter Medications as of 01/31/2024  Medication Sig Note   albuterol (PROVENTIL) (2.5 MG/3ML) 0.083% nebulizer solution Take 3 mLs (2.5 mg total) by nebulization every 6 (six) hours as needed for wheezing or shortness of breath.    albuterol (VENTOLIN HFA) 108 (90 Base) MCG/ACT inhaler INHALE 1 TO 2 PUFFS INTO THE LUNGS EVERY 4 HOURS AS NEEDED FOR WHEEZING OR SHORTNESS OF BREATH    bismuth subsalicylate (PEPTO BISMOL) 262 MG chewable tablet Chew 524 mg by mouth as needed.    calcium carbonate (TUMS EX) 750 MG chewable tablet Chew 1 tablet by mouth 3 (three) times daily as needed for heartburn.    carbidopa-levodopa (SINEMET CR) 50-200 MG tablet Take 1 tablet by mouth 3 (three) times daily.    colestipol (COLESTID) 1 g tablet Take 3 tablets (3 g total) by mouth 2 (two) times daily. (Patient taking differently: Take 4 g by mouth 2 (two) times daily.)    dexlansoprazole (DEXILANT) 60 MG capsule Take 1 capsule (60 mg total) by mouth daily.    diazepam (VALIUM) 5 MG tablet Take 2.5 mg by mouth 2 (two) times daily as needed for anxiety.    diclofenac sodium (VOLTAREN) 1 % GEL Apply 2 g topically 4 (four) times daily as needed (for knee & finger  pain.).     dicyclomine (BENTYL) 20 MG tablet Take 1 tablet (20 mg total) by mouth every 6 (six) hours. (Patient taking differently: Take 20 mg by mouth every 6 (six) hours. Pt is taking 3 times a daily) 11/05/2023: Taking three times daily as needed for abdominal pain/cramping   DULoxetine (CYMBALTA) 60 MG capsule Take 1 capsule (60 mg total) by mouth 2 (two) times daily.    fluticasone-salmeterol (ADVAIR DISKUS) 250-50 MCG/ACT AEPB Inhale 1 puff into the lungs in the morning and at bedtime. Rinse mouth after use    HYDROcodone-acetaminophen (NORCO/VICODIN) 5-325 MG tablet Take 1 tablet by mouth 2 (two) times daily as needed. Take 2x"s daily    hydroxychloroquine (PLAQUENIL) 200 MG tablet Plaquenil 200 mg tablet  Take 1 tablet twice a day by oral route after meals for 90 days.    lactase (LACTAID) 3000 units tablet Take 3,000 Units by mouth 3 (three) times daily with meals.    latanoprost (XALATAN) 0.005 % ophthalmic solution 1 drop at bedtime.    levothyroxine (SYNTHROID) 75 MCG tablet TAKE 1 TABLET BY MOUTH DAILY 30 MINUTES BEFORE BREAKFAST    meclizine (ANTIVERT) 12.5 MG tablet Take 12.5 mg by mouth 2 (two) times daily.    montelukast (SINGULAIR) 10 MG tablet TAKE 1 TABLET BY MOUTH EVERY NIGHT AT BEDTIME    ondansetron (ZOFRAN-ODT) 8 MG disintegrating tablet DISSOLVE 1 TABLET ON THE TONGUE EVERY  8 HOURS AS NEEDED FOR NAUSEA OR VOMITING    PAXIL 20 MG tablet Take 20 mg by mouth daily.    topiramate (TOPAMAX) 50 MG tablet Take 3 tablets (150 mg total) by mouth 2 (two) times daily.    Zoledronic Acid (RECLAST IV)     [DISCONTINUED] EPINEPHrine 0.3 mg/0.3 mL IJ SOAJ injection Inject 0.3 mg into the muscle as needed for anaphylaxis.    [DISCONTINUED] metoCLOPramide (REGLAN) 5 MG tablet Take 1 tablet (5 mg total) by mouth every 6 (six) hours as needed for nausea. (Patient not taking: Reported on 02/08/2024)    [DISCONTINUED] nitrofurantoin, macrocrystal-monohydrate, (MACROBID) 100 MG capsule Take 1 cap  twice per day for 10 days.    [DISCONTINUED] triamcinolone ointment (KENALOG) 0.5 % Apply 1 Application topically 2 (two) times daily.    [DISCONTINUED] trihexyphenidyl (ARTANE) 2 MG tablet Take by mouth.    No facility-administered encounter medications on file as of 01/31/2024.      Medical History: Past Medical History:  Diagnosis Date   ADHD    Allergy 12/30/2018   Seasonal   Anemia    ANXIETY 06/06/2007   Arthritis    related to lupus   Asthma    BACK PAIN 02/22/2009   Cervical cancer (HCC)    LEEP 2005    Chicken pox    Chronic pain    goes to pain management provider   Collagen vascular disease (HCC)    Connective tissue disease (HCC)    DEPRESSION 06/06/2007   Essential tremor    Fibromyalgia    Fibromyalgia    FREQUENCY, URINARY 07/07/2007   Gastritis    GERD 06/06/2007   Glaucoma    Headache    h/o migraines    History of hiatal hernia    History of kidney stones    HYPOTHYROIDISM 06/06/2007   INTERSTITIAL CYSTITIS 09/11/2010   Lupus 2006   Lupus    Menopause    Migraine    Mitral valve regurgitation    NEPHROLITHIASIS, HX OF 11/05/2010   Neuropathy    Neuropathy    OCD (obsessive compulsive disorder)    Optic neuritis    2005   OSTEOPENIA 10/14/2009   Osteoporosis    OVERACTIVE BLADDER 10/14/2009   PAIN, CHRONIC NEC 06/06/2007   Panniculitis    Parkinson disease (HCC) 04/03/2019   Parkinson disease (HCC)    POLYARTHRITIS 08/19/2007   Raynaud's disease    Sicca syndrome (HCC)    Ulcer 11/30/2017   Don't actually know date   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 Valentino Saxon [facility]    Vasculitis [facility])    WEIGHT GAIN 02/22/2009     Vital Signs: BP 131/85   Pulse 71   Temp 97.9 F (36.6 C)   Resp 16   Ht 5\' 8"  (1.727 m)   Wt 171 lb 9.6 oz (77.8 kg)   LMP 12/15/2015 Comment: neg urine test   SpO2 98%   BMI 26.09 kg/m    Review of Systems  Constitutional:  Positive for chills. Negative for fever.  HENT:   Negative for congestion, mouth sores and postnasal drip.   Respiratory:  Negative for cough.   Cardiovascular:  Negative for chest pain.  Gastrointestinal:  Positive for abdominal pain and nausea.  Genitourinary:  Positive for dysuria, flank pain, frequency and urgency.  Psychiatric/Behavioral: Negative.      Physical Exam Vitals and nursing note reviewed.  Constitutional:      Appearance: She  is ill-appearing. She is not toxic-appearing.  HENT:     Head: Normocephalic and atraumatic.  Cardiovascular:     Rate and Rhythm: Normal rate and regular rhythm.  Pulmonary:     Effort: Pulmonary effort is normal.     Breath sounds: Normal breath sounds.  Skin:    General: Skin is warm and dry.  Neurological:     Mental Status: She is alert.  Psychiatric:        Thought Content: Thought content normal.       Assessment/Plan: 1. Urinary tract infection without hematuria, site unspecified (Primary) Will go ahead and treat with macrobid and adjust based on C/S. Advised to go to ED if symptoms worsening - CULTURE, URINE COMPREHENSIVE  2. Dysuria - POCT Urinalysis Dipstick   General Counseling: Daanya verbalizes understanding of the findings of todays visit and agrees with plan of treatment. I have discussed any further diagnostic evaluation that may be needed or ordered today. We also reviewed her medications today. she has been encouraged to call the office with any questions or concerns that should arise related to todays visit.    Counseling:    Orders Placed This Encounter  Procedures   CULTURE, URINE COMPREHENSIVE   POCT Urinalysis Dipstick    Meds ordered this encounter  Medications   DISCONTD: nitrofurantoin, macrocrystal-monohydrate, (MACROBID) 100 MG capsule    Sig: Take 1 cap twice per day for 10 days.    Dispense:  20 capsule    Refill:  0    Time spent:25 Minutes

## 2024-02-03 ENCOUNTER — Telehealth: Payer: Self-pay

## 2024-02-03 ENCOUNTER — Ambulatory Visit
Admission: RE | Admit: 2024-02-03 | Discharge: 2024-02-03 | Disposition: A | Discharge status: Home / Self Care | Payer: Medicare Other | Source: Ambulatory Visit | Attending: Orthopedic Surgery | Admitting: Orthopedic Surgery

## 2024-02-03 DIAGNOSIS — M23361 Other meniscus derangements, other lateral meniscus, right knee: Secondary | ICD-10-CM | POA: Diagnosis not present

## 2024-02-03 DIAGNOSIS — M1711 Unilateral primary osteoarthritis, right knee: Secondary | ICD-10-CM

## 2024-02-03 DIAGNOSIS — M23231 Derangement of other medial meniscus due to old tear or injury, right knee: Secondary | ICD-10-CM

## 2024-02-03 LAB — CULTURE, URINE COMPREHENSIVE

## 2024-02-03 NOTE — Telephone Encounter (Signed)
-----   Message from Carlean Jews sent at 02/03/2024  1:09 PM EST ----- Please let her know that her urine culture came back as mixed flora. Also her heavy metal screen was normal as well.

## 2024-02-03 NOTE — Telephone Encounter (Signed)
 LVM and sent MyChart regarding urine culture results.

## 2024-02-07 ENCOUNTER — Ambulatory Visit: Payer: Self-pay | Admitting: *Deleted

## 2024-02-07 NOTE — Patient Instructions (Addendum)
 Visit Information  Thank you for taking time to visit with me today. Please don't hesitate to contact me if I can be of assistance to you.   Following are the goals we discussed today:   Goals Addressed             This Visit's Progress    referral to a mental health therapist       Activities and task to complete in order to accomplish goals.   EMOTIONAL / MENTAL HEALTH SUPPORT Keep all upcoming appointments discussed today Self Support options  ( follow up with [facility] regarding referral to the Psychologist-therapist in [facility] available-please call to schedule initial appointment [phone])  Continue with compliance of taking medication prescribed by Doctor         If you are experiencing a Mental Health or Behavioral Health Crisis or need someone to talk to, please call the Suicide and Crisis Lifeline: 988   Patient verbalizes understanding of instructions and care plan provided today and agrees to view in MyChart. Active MyChart status and patient understanding of how to access instructions and care plan via MyChart confirmed with patient.     No further follow up required: patient to follow up with [facility] for ongoing mental health counseling   Ngozi Alvidrez, LCSW [facility]  [facility], [facility] Licensed Clinical Social Worker Care Coordinator  Direct Dial: [phone]

## 2024-02-07 NOTE — Patient Outreach (Addendum)
 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  What matters to the patients health and wellness today?  Patient confirms plan to contact [facility] to schedule initial appointment for ongoing mental health support [phone].    Goals Addressed             This Visit's Progress    referral to a mental health therapist       Activities and task to complete in order to accomplish goals.   EMOTIONAL / MENTAL HEALTH SUPPORT Keep all upcoming appointments discussed today Self Support options  ( follow up with [facility] regarding referral to the Psychologist-therapist in [facility] available-please call to schedule initial appointment [phone])  Continue with compliance of taking medication prescribed by Doctor         SDOH assessments and interventions completed:  No     Care Coordination Interventions:  Yes, provided  Interventions Today    Flowsheet Row Most Recent Value  Chronic Disease   Chronic disease during today's visit Other  [Parkinsons, Lupus, hypothyroidism, migraines, chronic pain]  General Interventions   General Interventions Discussed/Reviewed Doctor Visits, General Interventions Reviewed  [continues to reports numbness in hands, feet, face -test results are back normal-continues to follow up with specialist to confirm origion. Confirmed that patient was able to donate kitchen utensils and a chair to the [facility]]  Doctor Visits Discussed/Reviewed Doctor Visits Reviewed  [Neuorlogy re/scheduled for August-Dr. Waynetta Sandy [facility] in August, Parkinson's MD-June(considering Botox)  Rheumatolygy June]  Mental Health Interventions   Mental Health Discussed/Reviewed Mental Health Discussed,  Coping Strategies, Depression  [mul stressors discussed related to her med condition/ life challenges, emotional support provided, discussed prioritizing tasks, encouraged ongoing MH f/u [facility] contacted-therapist located [place]-pt agreeable to contact them to schedule]       Follow up plan: No further intervention required.   Encounter Outcome:  Patient Visit Completed

## 2024-02-08 ENCOUNTER — Telehealth: Payer: Self-pay

## 2024-02-08 ENCOUNTER — Other Ambulatory Visit: Payer: Self-pay | Admitting: Physician Assistant

## 2024-02-08 ENCOUNTER — Ambulatory Visit (INDEPENDENT_AMBULATORY_CARE_PROVIDER_SITE_OTHER): Payer: Medicare Other | Admitting: Obstetrics and Gynecology

## 2024-02-08 ENCOUNTER — Ambulatory Visit: Payer: Medicare Other | Admitting: Obstetrics and Gynecology

## 2024-02-08 ENCOUNTER — Encounter: Payer: Self-pay | Admitting: Obstetrics and Gynecology

## 2024-02-08 VITALS — BP 125/63 | HR 84 | Resp 16 | Ht 68.0 in | Wt 172.4 lb

## 2024-02-08 DIAGNOSIS — Z01419 Encounter for gynecological examination (general) (routine) without abnormal findings: Secondary | ICD-10-CM | POA: Diagnosis not present

## 2024-02-08 DIAGNOSIS — R69 Illness, unspecified: Secondary | ICD-10-CM

## 2024-02-08 DIAGNOSIS — Z1231 Encounter for screening mammogram for malignant neoplasm of breast: Secondary | ICD-10-CM

## 2024-02-08 DIAGNOSIS — R634 Abnormal weight loss: Secondary | ICD-10-CM

## 2024-02-08 MED ORDER — EPINEPHRINE 0.3 MG/0.3ML IJ SOAJ: 0.3000 mg | INTRAMUSCULAR | 2 refills | Status: AC | PRN

## 2024-02-08 NOTE — Progress Notes (Signed)
 ANNUAL PREVENTATIVE CARE GYNECOLOGY  ENCOUNTER NOTE  Subjective:       [name] Charolett Bumpers, [name] [name] is a 58 y.o. G1P1 female with complex medical history here for a routine annual gynecologic exam. The patient is not sexually active. The patient is not taking hormone replacement therapy. Patient denies post-menopausal vaginal bleeding. The patient wears seatbelts: yes. The patient participates in regular exercise: no. Has the patient ever been transfused or tattooed?: no. The patient reports that there is not domestic violence in her life.   Current complaints: 1.  Concerned about her overall health. Has lost ~ 100lbs.  Notes respiratory, GI, and musculoskeletal issues as well.  Has dizziness, lightheadedness. Has had 4 admissions to the hospital [facility] Has seen multiple specialists with no findings, including a Rheumatologist, Neurologist, Cardiologist, and GI.  Does have a h/o Lupus.  Notes she is down to eating rice and Carnation Good Start for breakfasts. Has seen Nutritionists and GI regarding dietary recommendations but is unable to tolerate many things due to nausea/vomiting/diarrhea.    Gynecologic History Patient's last menstrual period was 12/15/2015. Contraception: post menopausal status Last Pap: 12/18/2022. Results were: normal Last mammogram: 02/25/2023. Results were: normal Last Colonoscopy: 10/16/2022: 10 years Last Dexa Scan: Never done   Obstetric History OB History  Gravida Para Term Preterm AB Living  1 1    1   SAB IAB Ectopic Multiple Live Births      1    # Outcome Date GA Lbr Len/2nd Weight Sex Type Anes PTL Lv  1 Para 06/19/98    F Vag-Spont   LIV    Past Medical History:  Diagnosis Date   ADHD    Allergy 12/30/2018   Seasonal   Anemia    ANXIETY 06/06/2007   Arthritis    related to lupus   Asthma    BACK PAIN 02/22/2009   Cervical cancer (HCC)    LEEP 2005    Chicken pox    Chronic pain    goes to pain management provider   Collagen vascular  disease (HCC)    Connective tissue disease (HCC)    DEPRESSION 06/06/2007   Essential tremor    Fibromyalgia    Fibromyalgia    FREQUENCY, URINARY 07/07/2007   Gastritis    GERD 06/06/2007   Glaucoma    Headache    h/o migraines    History of hiatal hernia    History of kidney stones    HYPOTHYROIDISM 06/06/2007   INTERSTITIAL CYSTITIS 09/11/2010   Lupus 2006   Lupus    Menopause    Migraine    Mitral valve regurgitation    NEPHROLITHIASIS, HX OF 11/05/2010   Neuropathy    Neuropathy    OCD (obsessive compulsive disorder)    Optic neuritis    2005   OSTEOPENIA 10/14/2009   Osteoporosis    OVERACTIVE BLADDER 10/14/2009   PAIN, CHRONIC NEC 06/06/2007   Panniculitis    Parkinson disease (HCC) 04/03/2019   Parkinson disease (HCC)    POLYARTHRITIS 08/19/2007   Raynaud's disease    Sicca syndrome (HCC)    Ulcer 11/30/2017   Don't actually know date   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 Valentino Saxon [facility]    Vasculitis [facility])    WEIGHT GAIN 02/22/2009    Family History  Problem Relation Age of Onset   Hypertension Mother    Arthritis Mother    Heart disease Mother  afib   Asthma Mother    Asthma Sister    Asthma Daughter    Arthritis Daughter    Heart disease Daughter        ?heart condition on BB   ADD / ADHD Daughter    Depression Daughter    Intellectual disability Daughter    Learning disabilities Daughter    Pancreatic cancer Father    Cancer Father        pancreatitic    Diabetes Maternal Grandmother    Heart disease Maternal Grandmother    Arthritis Maternal Grandmother    Hypertension Maternal Grandmother    Dementia Maternal Grandmother    Colon cancer Maternal Uncle 45   Crohn's disease Maternal Aunt    Breast cancer Maternal Aunt 62   Diabetes Maternal Aunt    Breast cancer Cousin 64       maternal   Cancer Cousin        m cousin breat cancer s/p removal both breasts     Past Surgical History:   Procedure Laterality Date   ADENOIDECTOMY     APPENDECTOMY     CERVICAL BIOPSY  W/ LOOP ELECTRODE EXCISION  2005   CHOLECYSTECTOMY     COLONOSCOPY N/A 10/16/2022   Procedure: COLONOSCOPY;  Surgeon: Regis Bill, [name] [name];  Location: [facility] ENDOSCOPY;  Service: Gastroenterology;  Laterality: N/A;   ESOPHAGOGASTRODUODENOSCOPY N/A 10/16/2022   Procedure: ESOPHAGOGASTRODUODENOSCOPY (EGD);  Surgeon: Regis Bill, [name] [name];  Location: [facility] ENDOSCOPY;  Service: Gastroenterology;  Laterality: N/A;   ESOPHAGOGASTRODUODENOSCOPY (EGD) WITH PROPOFOL N/A 08/27/2017   Procedure: ESOPHAGOGASTRODUODENOSCOPY (EGD) WITH PROPOFOL;  Surgeon: Scot Jun, [name] [name];  Location: [facility] ENDOSCOPY;  Service: Endoscopy;  Laterality: N/A;   FRACTURE SURGERY  11/30/2008   HAND SURGERY     repair of left metatarsal    HARDWARE REMOVAL Right 02/16/2017   Procedure: HARDWARE REMOVAL FROM HIP;  Surgeon: Kennedy Bucker, [name] [name];  Location: [facility] ORS;  Service: Orthopedics;  Laterality: Right;   JOINT REPLACEMENT     OTHER SURGICAL HISTORY     left 3rd metatarsal fracture repair 2011    REVISION TOTAL HIP ARTHROPLASTY  06/2009   replacement 2010 then screw and plate removal 0981; right hip   TONSILLECTOMY     TONSILLECTOMY     wrist  ligament repair bilateral     WRIST RECONSTRUCTION     WRIST SURGERY     laceration of right wrist ligaments   wrist surgery     laceration of left wrist surgery     Social History   Socioeconomic History   Marital status: Divorced    Spouse name: Not on file   Number of children: 2   Years of education: Not on file   Highest education level: Professional school degree (e.g., [name] [name], DDS, DVM, JD)  Occupational History   Occupation: DISABLED    Employer: UNEMPLOYED  Tobacco Use   Smoking status: Never   Smokeless tobacco: Never  Vaping Use   Vaping status: Never Used  Substance and Sexual Activity   Alcohol use: No   Drug use: No   Sexual activity: Not Currently    Birth  control/protection: Post-menopausal, None  Other Topics Concern   Not on file  Social History Narrative   She was previously a pediatrician, 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     She lives with daughter and mother      1 living  child    Social Drivers of Corporate investment banker Strain: Low Risk  (12/15/2023)   Received from [facility]   Overall Financial Resource Strain (CARDIA)    Difficulty of Paying Living Expenses: Not hard at all  Recent Concern: Financial Resource Strain - Medium Risk (11/18/2023)   Received from [facility]   Overall Financial Resource Strain (CARDIA)    Difficulty of Paying Living Expenses: Somewhat hard  Food Insecurity: No Food Insecurity (12/15/2023)   Received from [facility]   Hunger Vital Sign    Ran Out of Food in the Last Year: Never true    Worried About Running Out of Food in the Last Year: Never true  Recent Concern: Food Insecurity - Food Insecurity Present (11/18/2023)   Received from [facility]   Hunger Vital Sign    Worried About Running Out of Food in the Last Year: Sometimes true    Ran Out of Food in the Last Year: Sometimes true  Transportation Needs: No Transportation Needs (12/15/2023)   Received from [facility]   PRAPARE - Transportation    Lack of Transportation (Non-Medical): No    In the past 12 months, has lack of transportation kept you from medical appointments or from getting medications?: No  Physical Activity: Inactive (11/18/2023)   Received from [facility]   Exercise Vital Sign    Minutes of Exercise per Session: 0 min    Days of Exercise per Week: 0 days  Stress: Stress Concern Present (11/18/2023)   Received from [facility] - Occupational Stress Questionnaire     Feeling of Stress : To some extent  Social Connections: Socially Isolated (11/18/2023)   Received from [facility]   Social Connection and Isolation Panel [NHANES]    Frequency of Communication with Friends and Family: Never    Frequency of Social Gatherings with Friends and Family: Never    Attends Religious Services: Never    Database administrator or Organizations: No    Attends Banker Meetings: Never    Marital Status: Divorced  Catering manager Violence: Not At Risk (11/08/2023)   Humiliation, Afraid, Rape, and Kick questionnaire    Fear of Current or Ex-Partner: No    Emotionally Abused: No    Physically Abused: No    Sexually Abused: No    Current Outpatient Medications on File Prior to Visit  Medication Sig Dispense Refill   albuterol (PROVENTIL) (2.5 MG/3ML) 0.083% nebulizer solution Take 3 mLs (2.5 mg total) by nebulization every 6 (six) hours as needed for wheezing or shortness of breath. 150 mL 12   albuterol (VENTOLIN HFA) 108 (90 Base) MCG/ACT inhaler INHALE 1 TO 2 PUFFS INTO THE LUNGS EVERY 4 HOURS AS NEEDED FOR WHEEZING OR SHORTNESS OF BREATH 18 g 11   bismuth subsalicylate (PEPTO BISMOL) 262 MG chewable tablet Chew 524 mg by mouth as needed.     calcium carbonate (TUMS EX) 750 MG chewable tablet Chew 1 tablet by mouth 3 (three) times daily as needed for heartburn.     carbidopa-levodopa (SINEMET CR) 50-200 MG tablet Take 1 tablet by mouth 3 (three) times daily.     colestipol (COLESTID) 1 g tablet Take 3 tablets (3 g total) by mouth 2 (two) times daily. (Patient taking differently: Take 4 g by mouth 2 (two) times daily.) 180 tablet 1  dexlansoprazole (DEXILANT) 60 MG capsule Take 1 capsule (60 mg total) by mouth daily. 90 capsule 3   diazepam (VALIUM) 5 MG tablet Take 2.5 mg by mouth 2 (two) times daily as needed for anxiety.     diclofenac sodium (VOLTAREN) 1 % GEL Apply 2 g topically 4 (four) times daily as needed (for knee & finger  pain.).      dicyclomine (BENTYL) 20 MG tablet Take 1 tablet (20 mg total) by mouth every 6 (six) hours. (Patient taking differently: Take 20 mg by mouth every 6 (six) hours. Pt is taking 3 times a daily) 120 tablet 2   DULoxetine (CYMBALTA) 60 MG capsule Take 1 capsule (60 mg total) by mouth 2 (two) times daily. 120 capsule 1   EPINEPHrine 0.3 mg/0.3 mL IJ SOAJ injection Inject 0.3 mg into the muscle as needed for anaphylaxis. 1 each 2   fluticasone-salmeterol (ADVAIR DISKUS) 250-50 MCG/ACT AEPB Inhale 1 puff into the lungs in the morning and at bedtime. Rinse mouth after use 60 each 11   HYDROcodone-acetaminophen (NORCO/VICODIN) 5-325 MG tablet Take 1 tablet by mouth 2 (two) times daily as needed. Take 2x"s daily     hydroxychloroquine (PLAQUENIL) 200 MG tablet Plaquenil 200 mg tablet  Take 1 tablet twice a day by oral route after meals for 90 days.     lactase (LACTAID) 3000 units tablet Take 3,000 Units by mouth 3 (three) times daily with meals.     latanoprost (XALATAN) 0.005 % ophthalmic solution 1 drop at bedtime.     levothyroxine (SYNTHROID) 75 MCG tablet TAKE 1 TABLET BY MOUTH DAILY 30 MINUTES BEFORE BREAKFAST 90 tablet 3   meclizine (ANTIVERT) 12.5 MG tablet Take 12.5 mg by mouth 2 (two) times daily.     metoCLOPramide (REGLAN) 5 MG tablet Take 1 tablet (5 mg total) by mouth every 6 (six) hours as needed for nausea. 30 tablet 0   montelukast (SINGULAIR) 10 MG tablet TAKE 1 TABLET BY MOUTH EVERY NIGHT AT BEDTIME 90 tablet 3   nitrofurantoin, macrocrystal-monohydrate, (MACROBID) 100 MG capsule Take 1 cap twice per day for 10 days. 20 capsule 0   ondansetron (ZOFRAN-ODT) 8 MG disintegrating tablet DISSOLVE 1 TABLET ON THE TONGUE EVERY 8 HOURS AS NEEDED FOR NAUSEA OR VOMITING 90 tablet 2   PAXIL 20 MG tablet Take 20 mg by mouth daily.     topiramate (TOPAMAX) 50 MG tablet Take 3 tablets (150 mg total) by mouth 2 (two) times daily. 180 tablet 2   triamcinolone ointment (KENALOG) 0.5 % Apply 1  Application topically 2 (two) times daily. 30 g 2   trihexyphenidyl (ARTANE) 2 MG tablet Take by mouth.     Zoledronic Acid (RECLAST IV)      No current facility-administered medications on file prior to visit.    Allergies  Allergen Reactions   Bee Venom Anaphylaxis, Itching and Swelling    Affected area Affected area Affected area Affected area    Fish Allergy Hives, Swelling and Rash    Facial swelling  Facial swelling  Facial swelling Facial swelling  Facial swelling    Latex Anaphylaxis, Rash and Shortness Of Breath    Rash  Rash     Peanut (Diagnostic) Hives   Peanut-Containing Drug Products Anaphylaxis   Prasterone Other (See Comments) and Nausea And Vomiting    rash Other reaction(s): Headache Headaches.   Shellfish Allergy Hives, Other (See Comments), Rash and Swelling    Facial swelling Uncoded Allergy. Allergen: seafood Uncoded Allergy. Allergen:  CATS, Other Reaction: itch, wheezing Uncoded Allergy. Allergen: COMPAZINE, Other Reaction: tremors Facial swelling Facial swelling Uncoded Allergy. Allergen: seafood Uncoded Allergy. Allergen: CATS, Other Reaction: itch, wheezing Uncoded Allergy. Allergen: COMPAZINE, Other Reaction: tremors Facial swelling Uncoded Allergy. Allergen: seafood Uncoded Allergy. Allergen: CATS, Other Reaction: itch, wheezing Uncoded Allergy. Allergen: COMPAZINE, Other Reaction: tremors Facial swelling Facial swelling Uncoded Allergy. Allergen: seafood Uncoded Allergy. Allergen: CATS, Other Reaction: itch, wheezing Uncoded Allergy. Allergen: COMPAZINE, Other Reaction: tremors Facial swelling Facial swelling Uncoded Allergy. Allergen: seafood Uncoded Allergy. Allergen: CATS, Other Reaction: itch, wheezing Uncoded Allergy. Allergen: COMPAZINE, Other Reaction: tremors    Shellfish-Derived Products Hives, Other (See Comments), Rash and Swelling    Facial swelling Uncoded Allergy. Allergen: seafood Uncoded Allergy. Allergen:  CATS, Other Reaction: itch, wheezing Uncoded Allergy. Allergen: COMPAZINE, Other Reaction: tremors Facial swelling Facial swelling Uncoded Allergy. Allergen: seafood Uncoded Allergy. Allergen: CATS, Other Reaction: itch, wheezing Uncoded Allergy. Allergen: COMPAZINE, Other Reaction: tremors Facial swelling Uncoded Allergy. Allergen: seafood Uncoded Allergy. Allergen: CATS, Other Reaction: itch, wheezing Uncoded Allergy. Allergen: COMPAZINE, Other Reaction: tremors Facial swelling   Compazine [Prochlorperazine Edisylate] Other (See Comments)    tremors   Dhea [Nutritional Supplements] Other (See Comments)    Headaches.   Dilaudid [Hydromorphone Hcl]     ? reaction   Famotidine Other (See Comments) and Diarrhea    Other reaction(s): Headache Gas   Gabapentin Other (See Comments)    'snowed out'    Lactose    Lactose Intolerance (Gi)    Other Other (See Comments)    Other reaction(s): Unknown    Prochlorperazine     Other reaction(s): Other (See Comments) ticks   Promethazine     Other reaction(s): Other (See Comments) Ticks   Promethazine Hcl Other (See Comments)    CNS disorder   Reclast [Zoledronic Acid]     Weakness could not move limbs, fatigue, increase sleep   Clarithromycin Hives and Rash   Clotrimazole Swelling and Rash   Hydromorphone Hcl Rash and Hives    "Rash all over"   Penicillins Rash and Other (See Comments)    Has patient had a PCN reaction causing immediate rash, facial/tongue/throat swelling, SOB or lightheadedness with hypotension:No Has patient had a PCN reaction causing severe rash involving mucus membranes or skin necrosis:No Has patient had a PCN reaction that required hospitalization:No Has patient had a PCN reaction occurring within the last 10 years:No If all of the above answers are "NO", then may proceed with Cephalosporin use.      Review of Systems Review of Systems -  negative for GYN concerns complaints. Otherwise positive as noted  in HPI    Objective:   Ht 5\' 8"  (1.727 m)   Wt 172 lb 6.4 oz (78.2 kg)   LMP 12/15/2015 Comment: neg urine test   BMI 26.21 kg/m  CONSTITUTIONAL: Well-developed, well-nourished female in no acute distress.  PSYCHIATRIC: Normal mood and affect. Normal behavior. Normal judgment and thought content. NEUROLGIC: Alert and oriented to person, place, and time. Normal muscle tone coordination. No cranial nerve deficit noted. HENT:  Normocephalic, atraumatic, External right and left ear normal. Oropharynx is clear and moist EYES: Conjunctivae and EOM are normal. Pupils are equal, round, and reactive to light. No scleral icterus.  NECK: Normal range of motion, supple, no masses.  Normal thyroid.  SKIN: Skin is warm and dry. No rash noted. Not diaphoretic. No erythema. No pallor. CARDIOVASCULAR: Normal heart rate noted, regular rhythm, no murmur. RESPIRATORY: Clear to auscultation bilaterally. Effort  and breath sounds normal, no problems with respiration noted. BREASTS: Symmetric in size. No masses, skin changes, nipple drainage, or lymphadenopathy. ABDOMEN: Soft, normal bowel sounds, no distention noted.  No tenderness, rebound or guarding.  BLADDER: Normal PELVIC: deferred today as patient has no GYN concerns and pap smear is up to date.  MUSCULOSKELETAL: Normal range of motion. No tenderness.  No cyanosis, clubbing, or edema.  2+ distal pulses. LYMPHATIC: No Axillary, Supraclavicular, or Inguinal Adenopathy.   Labs: Lab Results  Component Value Date   WBC 5.3 01/05/2024   HGB 14.6 01/05/2024   HCT 43.7 01/05/2024   MCV 85.9 01/05/2024   PLT 193 01/05/2024    Lab Results  Component Value Date   CREATININE 0.80 01/05/2024   BUN 10 01/05/2024   NA 139 01/05/2024   K 3.7 01/05/2024   CL 104 01/05/2024   CO2 26 01/05/2024    Lab Results  Component Value Date   ALT 13 01/05/2024   AST 16 01/05/2024   ALKPHOS 67 01/05/2024   BILITOT 0.6 01/05/2024    Lab Results  Component  Value Date   CHOL 166 08/26/2021   HDL 61.30 08/26/2021   LDLCALC 91 08/26/2021   TRIG 66.0 08/26/2021   CHOLHDL 3 08/26/2021    Lab Results  Component Value Date   TSH 2.987 01/05/2024    Lab Results  Component Value Date   HGBA1C 6.0 (A) 04/29/2022     Assessment:   1. Encounter for well woman exam with routine gynecological exam   2. Encounter for screening mammogram for malignant neoplasm of breast   3. Weight loss   4. Multiple medical problems      Plan:  Pap:  UTD Mammogram: Ordered Colon Screening:   UTD Labs: none ordered.  Patient has had many labs over the past year.  Routine preventative health maintenance measures emphasized: Exercise/Diet/Weight control and Stress Management.  Flu vaccine status: 08/09/2023 Unexplained weight loss, despite workup and dietician referrals. Is trying to manage due to poor diet Return to Clinic - 1 Year  Hildred Laser, [name] [name] [facility]

## 2024-02-10 NOTE — Telephone Encounter (Signed)
 Lauren sent EpiPen

## 2024-02-14 ENCOUNTER — Ambulatory Visit: Payer: Medicare Other | Admitting: Urology

## 2024-02-14 ENCOUNTER — Encounter: Payer: Self-pay | Admitting: Urology

## 2024-02-14 VITALS — BP 145/70 | HR 80 | Ht 68.0 in | Wt 170.0 lb

## 2024-02-14 DIAGNOSIS — R32 Unspecified urinary incontinence: Secondary | ICD-10-CM

## 2024-02-14 DIAGNOSIS — N390 Urinary tract infection, site not specified: Secondary | ICD-10-CM | POA: Diagnosis not present

## 2024-02-14 MED ORDER — NITROFURANTOIN MACROCRYSTAL 100 MG PO CAPS: 100.0000 mg | ORAL_CAPSULE | Freq: Every day | ORAL | 11 refills | Status: AC

## 2024-02-14 NOTE — Progress Notes (Signed)
 02/14/2024 2:00 PM   [name] Charolett Bumpers, MD 08-12-1966 [id number]  Referring provider: Remo Lipps, PA [address] [place],  [place] [address]  Chief Complaint  Patient presents with   Establish Care   Urinary Incontinence    HPI: I was consulted to assist the patient's urinary tract infections and voiding dysfunction.  Patient describes a urinary tract infection late last year with frequency and incontinence and possibly chills.  She took Macrodantin.  Symptoms subsided but she still had the frequency.  She was treated for the same recently and symptoms improved some but she still has the frequency and the leaking.  She has had diarrhea for 2 years.  Culture was positive in November and negative recently  She currently voids every 30 to 60 minutes and cannot hold it for 2 hours.  Her bladder is very urgent.  She is going up 3-5 times a night.  She has urge incontinence.  She leaks with coughing sneezing and laughing.  No bedwetting.  Does not wear a pad  She has a cane for lupus and Parkinson's and has not had a hysterectomy  She noted a year ago she was voiding every 2 hours and had no urge incontinence.  In 2018 she describes being treated for an overactive bladder but then the symptoms improved  She has not had a hysterectomy.  She has had a kidney stone.  No history of recurrent UTIs or bladder surgery.   PMH: Past Medical History:  Diagnosis Date   ADHD    Allergy 12/30/2018   Seasonal   Anemia    ANXIETY 06/06/2007   Arthritis    related to lupus   Asthma    BACK PAIN 02/22/2009   Cervical cancer (HCC)    LEEP 2005    Chicken pox    Chronic pain    goes to pain management provider   Collagen vascular disease (HCC)    Connective tissue disease (HCC)    DEPRESSION 06/06/2007   Essential tremor    Fibromyalgia    Fibromyalgia    FREQUENCY, URINARY 07/07/2007   Gastritis    GERD 06/06/2007   Glaucoma    Headache    h/o migraines     History of hiatal hernia    History of kidney stones    HYPOTHYROIDISM 06/06/2007   INTERSTITIAL CYSTITIS 09/11/2010   Lupus 2006   Lupus    Menopause    Migraine    Mitral valve regurgitation    NEPHROLITHIASIS, HX OF 11/05/2010   Neuropathy    Neuropathy    OCD (obsessive compulsive disorder)    Optic neuritis    2005   OSTEOPENIA 10/14/2009   Osteoporosis    OVERACTIVE BLADDER 10/14/2009   PAIN, CHRONIC NEC 06/06/2007   Panniculitis    Parkinson disease (HCC) 04/03/2019   Parkinson disease (HCC)    POLYARTHRITIS 08/19/2007   Raynaud's disease    Sicca syndrome (HCC)    Ulcer 11/30/2017   Don't actually know date   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 Valentino Saxon [facility]    Vasculitis [facility])    WEIGHT GAIN 02/22/2009    Surgical History: Past Surgical History:  Procedure Laterality Date   ADENOIDECTOMY     APPENDECTOMY     CERVICAL BIOPSY  W/ LOOP ELECTRODE EXCISION  2005   CHOLECYSTECTOMY     COLONOSCOPY N/A 10/16/2022   Procedure: COLONOSCOPY;  Surgeon: Regis Bill, MD;  Location:  [facility] ENDOSCOPY;  Service: Gastroenterology;  Laterality: N/A;   ESOPHAGOGASTRODUODENOSCOPY N/A 10/16/2022   Procedure: ESOPHAGOGASTRODUODENOSCOPY (EGD);  Surgeon: Regis Bill, MD;  Location: [facility] ENDOSCOPY;  Service: Gastroenterology;  Laterality: N/A;   ESOPHAGOGASTRODUODENOSCOPY (EGD) WITH PROPOFOL N/A 08/27/2017   Procedure: ESOPHAGOGASTRODUODENOSCOPY (EGD) WITH PROPOFOL;  Surgeon: Scot Jun, MD;  Location: [facility] ENDOSCOPY;  Service: Endoscopy;  Laterality: N/A;   FRACTURE SURGERY  11/30/2008   HAND SURGERY     repair of left metatarsal    HARDWARE REMOVAL Right 02/16/2017   Procedure: HARDWARE REMOVAL FROM HIP;  Surgeon: Kennedy Bucker, MD;  Location: [facility] ORS;  Service: Orthopedics;  Laterality: Right;   JOINT REPLACEMENT     OTHER SURGICAL HISTORY     left 3rd metatarsal fracture repair 2011    REVISION TOTAL HIP ARTHROPLASTY   06/2009   replacement 2010 then screw and plate removal 3151; right hip   TONSILLECTOMY     TONSILLECTOMY     wrist  ligament repair bilateral     WRIST RECONSTRUCTION     WRIST SURGERY     laceration of right wrist ligaments   wrist surgery     laceration of left wrist surgery     Home Medications:  Allergies as of 02/14/2024       Reactions   Bee Venom Anaphylaxis, Itching, Swelling   Affected area Affected area Affected area Affected area   Fish Allergy Hives, Swelling, Rash   Facial swelling Facial swelling Facial swelling Facial swelling Facial swelling   Latex Anaphylaxis, Rash, Shortness Of Breath   Rash  Rash    Peanut (diagnostic) Hives   Peanut-containing Drug Products Anaphylaxis   Prasterone Other (See Comments), Nausea And Vomiting   rash Other reaction(s): Headache Headaches.   Shellfish Allergy Hives, Other (See Comments), Rash, Swelling   Facial swelling Uncoded Allergy. Allergen: seafood Uncoded Allergy. Allergen: CATS, Other Reaction: itch, wheezing Uncoded Allergy. Allergen: COMPAZINE, Other Reaction: tremors Facial swelling Facial swelling Uncoded Allergy. Allergen: seafood Uncoded Allergy. Allergen: CATS, Other Reaction: itch, wheezing Uncoded Allergy. Allergen: COMPAZINE, Other Reaction: tremors Facial swelling Uncoded Allergy. Allergen: seafood Uncoded Allergy. Allergen: CATS, Other Reaction: itch, wheezing Uncoded Allergy. Allergen: COMPAZINE, Other Reaction: tremors Facial swelling Facial swelling Uncoded Allergy. Allergen: seafood Uncoded Allergy. Allergen: CATS, Other Reaction: itch, wheezing Uncoded Allergy. Allergen: COMPAZINE, Other Reaction: tremors Facial swelling Facial swelling Uncoded Allergy. Allergen: seafood Uncoded Allergy. Allergen: CATS, Other Reaction: itch, wheezing Uncoded Allergy. Allergen: COMPAZINE, Other Reaction: tremors   Shellfish-derived Products Hives, Other (See Comments), Rash, Swelling   Facial  swelling Uncoded Allergy. Allergen: seafood Uncoded Allergy. Allergen: CATS, Other Reaction: itch, wheezing Uncoded Allergy. Allergen: COMPAZINE, Other Reaction: tremors Facial swelling Facial swelling Uncoded Allergy. Allergen: seafood Uncoded Allergy. Allergen: CATS, Other Reaction: itch, wheezing Uncoded Allergy. Allergen: COMPAZINE, Other Reaction: tremors Facial swelling Uncoded Allergy. Allergen: seafood Uncoded Allergy. Allergen: CATS, Other Reaction: itch, wheezing Uncoded Allergy. Allergen: COMPAZINE, Other Reaction: tremors Facial swelling   Compazine [prochlorperazine Edisylate] Other (See Comments)   tremors   Dhea [nutritional Supplements] Other (See Comments)   Headaches.   Dilaudid [hydromorphone Hcl]    ? reaction   Famotidine Other (See Comments), Diarrhea   Other reaction(s): Headache Gas   Gabapentin Other (See Comments)   'snowed out'    Lactose    Lactose Intolerance (gi)    Other Other (See Comments)   Other reaction(s): Unknown   Prochlorperazine    Other reaction(s): Other (See Comments) ticks   Promethazine    Other  reaction(s): Other (See Comments) Ticks   Promethazine Hcl Other (See Comments)   CNS disorder   Reclast [zoledronic Acid]    Weakness could not move limbs, fatigue, increase sleep   Clarithromycin Hives, Rash   Clotrimazole Swelling, Rash   Hydromorphone Hcl Rash, Hives   "Rash all over"   Penicillins Rash, Other (See Comments)   Has patient had a PCN reaction causing immediate rash, facial/tongue/throat swelling, SOB or lightheadedness with hypotension:No Has patient had a PCN reaction causing severe rash involving mucus membranes or skin necrosis:No Has patient had a PCN reaction that required hospitalization:No Has patient had a PCN reaction occurring within the last 10 years:No If all of the above answers are "NO", then may proceed with Cephalosporin use.        Medication List        Accurate as of February 14, 2024   2:00 PM. If you have any questions, ask your nurse or doctor.          STOP taking these medications    nitrofurantoin (macrocrystal-monohydrate) 100 MG capsule Commonly known as: Macrobid Stopped by: Lorin Picket A Mykiah Schmuck       TAKE these medications    albuterol (2.5 MG/3ML) 0.083% nebulizer solution Commonly known as: PROVENTIL Take 3 mLs (2.5 mg total) by nebulization every 6 (six) hours as needed for wheezing or shortness of breath.   albuterol 108 (90 Base) MCG/ACT inhaler Commonly known as: VENTOLIN HFA INHALE 1 TO 2 PUFFS INTO THE LUNGS EVERY 4 HOURS AS NEEDED FOR WHEEZING OR SHORTNESS OF BREATH   bismuth subsalicylate 262 MG chewable tablet Commonly known as: PEPTO BISMOL Chew 524 mg by mouth as needed.   calcium carbonate 750 MG chewable tablet Commonly known as: TUMS EX Chew 1 tablet by mouth 3 (three) times daily as needed for heartburn.   carbidopa-levodopa 50-200 MG tablet Commonly known as: SINEMET CR Take 1 tablet by mouth 3 (three) times daily.   colestipol 1 g tablet Commonly known as: COLESTID Take 3 tablets (3 g total) by mouth 2 (two) times daily. What changed: how much to take   dexlansoprazole 60 MG capsule Commonly known as: Dexilant Take 1 capsule (60 mg total) by mouth daily.   diazepam 5 MG tablet Commonly known as: VALIUM Take 2.5 mg by mouth 2 (two) times daily as needed for anxiety.   diclofenac sodium 1 % Gel Commonly known as: VOLTAREN Apply 2 g topically 4 (four) times daily as needed (for knee & finger pain.).   dicyclomine 20 MG tablet Commonly known as: BENTYL Take 1 tablet (20 mg total) by mouth every 6 (six) hours. What changed: additional instructions   DULoxetine 60 MG capsule Commonly known as: Cymbalta Take 1 capsule (60 mg total) by mouth 2 (two) times daily.   EPINEPHrine 0.3 mg/0.3 mL Soaj injection Commonly known as: EPI-PEN Inject 0.3 mg into the muscle as needed for anaphylaxis.   fluticasone-salmeterol  250-50 MCG/ACT Aepb Commonly known as: Advair Diskus Inhale 1 puff into the lungs in the morning and at bedtime. Rinse mouth after use   HYDROcodone-acetaminophen 5-325 MG tablet Commonly known as: NORCO/VICODIN Take 1 tablet by mouth 2 (two) times daily as needed. Take 2x"s daily   hydroxychloroquine 200 MG tablet Commonly known as: PLAQUENIL Plaquenil 200 mg tablet  Take 1 tablet twice a day by oral route after meals for 90 days.   lactase 3000 units tablet Commonly known as: LACTAID Take 3,000 Units by mouth 3 (three) times daily  with meals.   latanoprost 0.005 % ophthalmic solution Commonly known as: XALATAN 1 drop at bedtime.   levothyroxine 75 MCG tablet Commonly known as: SYNTHROID TAKE 1 TABLET BY MOUTH DAILY 30 MINUTES BEFORE BREAKFAST   LORazepam 2 MG tablet Commonly known as: ATIVAN Take 1 mg by mouth at bedtime.   meclizine 12.5 MG tablet Commonly known as: ANTIVERT Take 12.5 mg by mouth 2 (two) times daily.   montelukast 10 MG tablet Commonly known as: SINGULAIR TAKE 1 TABLET BY MOUTH EVERY NIGHT AT BEDTIME   ondansetron 8 MG disintegrating tablet Commonly known as: ZOFRAN-ODT DISSOLVE 1 TABLET ON THE TONGUE EVERY 8 HOURS AS NEEDED FOR NAUSEA OR VOMITING   Paxil 20 MG tablet Generic drug: PARoxetine Take 20 mg by mouth daily.   RECLAST IV   topiramate 50 MG tablet Commonly known as: Topamax Take 3 tablets (150 mg total) by mouth 2 (two) times daily.        Allergies:  Allergies  Allergen Reactions   Bee Venom Anaphylaxis, Itching and Swelling    Affected area Affected area Affected area Affected area    Fish Allergy Hives, Swelling and Rash    Facial swelling  Facial swelling  Facial swelling Facial swelling  Facial swelling    Latex Anaphylaxis, Rash and Shortness Of Breath    Rash  Rash     Peanut (Diagnostic) Hives   Peanut-Containing Drug Products Anaphylaxis   Prasterone Other (See Comments) and Nausea And Vomiting     rash Other reaction(s): Headache Headaches.   Shellfish Allergy Hives, Other (See Comments), Rash and Swelling    Facial swelling Uncoded Allergy. Allergen: seafood Uncoded Allergy. Allergen: CATS, Other Reaction: itch, wheezing Uncoded Allergy. Allergen: COMPAZINE, Other Reaction: tremors Facial swelling Facial swelling Uncoded Allergy. Allergen: seafood Uncoded Allergy. Allergen: CATS, Other Reaction: itch, wheezing Uncoded Allergy. Allergen: COMPAZINE, Other Reaction: tremors Facial swelling Uncoded Allergy. Allergen: seafood Uncoded Allergy. Allergen: CATS, Other Reaction: itch, wheezing Uncoded Allergy. Allergen: COMPAZINE, Other Reaction: tremors Facial swelling Facial swelling Uncoded Allergy. Allergen: seafood Uncoded Allergy. Allergen: CATS, Other Reaction: itch, wheezing Uncoded Allergy. Allergen: COMPAZINE, Other Reaction: tremors Facial swelling Facial swelling Uncoded Allergy. Allergen: seafood Uncoded Allergy. Allergen: CATS, Other Reaction: itch, wheezing Uncoded Allergy. Allergen: COMPAZINE, Other Reaction: tremors    Shellfish-Derived Products Hives, Other (See Comments), Rash and Swelling    Facial swelling Uncoded Allergy. Allergen: seafood Uncoded Allergy. Allergen: CATS, Other Reaction: itch, wheezing Uncoded Allergy. Allergen: COMPAZINE, Other Reaction: tremors Facial swelling Facial swelling Uncoded Allergy. Allergen: seafood Uncoded Allergy. Allergen: CATS, Other Reaction: itch, wheezing Uncoded Allergy. Allergen: COMPAZINE, Other Reaction: tremors Facial swelling Uncoded Allergy. Allergen: seafood Uncoded Allergy. Allergen: CATS, Other Reaction: itch, wheezing Uncoded Allergy. Allergen: COMPAZINE, Other Reaction: tremors Facial swelling   Compazine [Prochlorperazine Edisylate] Other (See Comments)    tremors   Dhea [Nutritional Supplements] Other (See Comments)    Headaches.   Dilaudid [Hydromorphone Hcl]     ? reaction   Famotidine Other (See  Comments) and Diarrhea    Other reaction(s): Headache Gas   Gabapentin Other (See Comments)    'snowed out'    Lactose    Lactose Intolerance (Gi)    Other Other (See Comments)    Other reaction(s): Unknown    Prochlorperazine     Other reaction(s): Other (See Comments) ticks   Promethazine     Other reaction(s): Other (See Comments) Ticks   Promethazine Hcl Other (See Comments)    CNS disorder   Reclast [Zoledronic Acid]  Weakness could not move limbs, fatigue, increase sleep   Clarithromycin Hives and Rash   Clotrimazole Swelling and Rash   Hydromorphone Hcl Rash and Hives    "Rash all over"   Penicillins Rash and Other (See Comments)    Has patient had a PCN reaction causing immediate rash, facial/tongue/throat swelling, SOB or lightheadedness with hypotension:No Has patient had a PCN reaction causing severe rash involving mucus membranes or skin necrosis:No Has patient had a PCN reaction that required hospitalization:No Has patient had a PCN reaction occurring within the last 10 years:No If all of the above answers are "NO", then may proceed with Cephalosporin use.    Family History: Family History  Problem Relation Age of Onset   Hypertension Mother    Arthritis Mother    Heart disease Mother        afib   Asthma Mother    Asthma Sister    Asthma Daughter    Arthritis Daughter    Heart disease Daughter        ?heart condition on BB   ADD / ADHD Daughter    Depression Daughter    Intellectual disability Daughter    Learning disabilities Daughter    Pancreatic cancer Father    Cancer Father        pancreatitic    Diabetes Maternal Grandmother    Heart disease Maternal Grandmother    Arthritis Maternal Grandmother    Hypertension Maternal Grandmother    Dementia Maternal Grandmother    Colon cancer Maternal Uncle 45   Crohn's disease Maternal Aunt    Breast cancer Maternal Aunt 41   Diabetes Maternal Aunt    Breast cancer Cousin 25       maternal    Cancer Cousin        m cousin breat cancer s/p removal both breasts     Social History:  reports that she has never smoked. She has never used smokeless tobacco. She reports that she does not drink alcohol and does not use drugs.  ROS:                                        Physical Exam: BP (!) 145/70   Pulse 80   Ht 5\' 8"  (1.727 m)   Wt 77.1 kg   LMP 12/15/2015 Comment: neg urine test   BMI 25.85 kg/m   Constitutional:  Alert and oriented, No acute distress.  Laboratory Data: Lab Results  Component Value Date   WBC 5.3 01/05/2024   HGB 14.6 01/05/2024   HCT 43.7 01/05/2024   MCV 85.9 01/05/2024   PLT 193 01/05/2024    Lab Results  Component Value Date   CREATININE 0.80 01/05/2024    No results found for: "PSA"  No results found for: "TESTOSTERONE"  Lab Results  Component Value Date   HGBA1C 6.0 (A) 04/29/2022    Urinalysis    Component Value Date/Time   COLORURINE YELLOW (A) 04/11/2023 1638   APPEARANCEUR CLEAR (A) 04/11/2023 1638   APPEARANCEUR Clear 11/06/2021 1137   LABSPEC 1.020 04/11/2023 1638   LABSPEC 1.023 09/25/2014 0016   PHURINE 5.5 04/11/2023 1638   GLUCOSEU NEGATIVE 04/11/2023 1638   GLUCOSEU NEGATIVE 09/06/2020 1346   HGBUR TRACE (A) 04/11/2023 1638   HGBUR negative 09/11/2010 1140   BILIRUBINUR Negative 01/31/2024 1155   BILIRUBINUR Negative 11/06/2021 1137   BILIRUBINUR Negative 09/25/2014 0016  KETONESUR NEGATIVE 04/11/2023 1638   PROTEINUR Negative 01/31/2024 1155   PROTEINUR 30 (A) 04/11/2023 1638   UROBILINOGEN 0.2 01/31/2024 1155   UROBILINOGEN 0.2 09/06/2020 1346   NITRITE Negative 01/31/2024 1155   NITRITE NEGATIVE 04/11/2023 1638   LEUKOCYTESUR Trace (A) 01/31/2024 1155   LEUKOCYTESUR TRACE (A) 04/11/2023 1638   LEUKOCYTESUR Negative 09/25/2014 0016    Pertinent Imaging: Urine reviewed and urine sent for culture.  Chart reviewed  Assessment & Plan: Patient has UTIs as noted.  Her last culture  was negative.  I thought it was best initially to put her on suppression therapy and have her come back in 8 weeks with a renal ultrasound and for pelvic examination and cystoscopy.  She has significant risk factors for neurogenic bladder.  She will likely need urodynamics in the future.  I mentioned the tests a day without specifics.  Call if culture positive.  The UTIs may have up regulated her overactive bladder but again she may have a neurogenic bladder. 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  She will return on Macrodantin 100 mg 30 x 11 for cystoscopy  1. Urinary incontinence, unspecified type (Primary)  - Urinalysis, Complete   No follow-ups on file.  Martina Sinner, MD  [facility] [address] [place], [place] [address] [phone]

## 2024-02-15 LAB — URINALYSIS, COMPLETE
Bilirubin, UA: NEGATIVE
Glucose, UA: NEGATIVE
Ketones, UA: NEGATIVE
Leukocytes,UA: NEGATIVE
Nitrite, UA: NEGATIVE
Protein,UA: NEGATIVE
RBC, UA: NEGATIVE
Specific Gravity, UA: 1.01 (ref 1.005–1.030)
Urobilinogen, Ur: 0.2 mg/dL (ref 0.2–1.0)
pH, UA: 6 (ref 5.0–7.5)

## 2024-02-15 LAB — MICROSCOPIC EXAMINATION
Bacteria, UA: NONE SEEN
RBC, Urine: NONE SEEN /HPF (ref 0–2)

## 2024-02-16 DIAGNOSIS — M25569 Pain in unspecified knee: Secondary | ICD-10-CM | POA: Diagnosis not present

## 2024-02-16 DIAGNOSIS — M79642 Pain in left hand: Secondary | ICD-10-CM | POA: Diagnosis not present

## 2024-02-16 DIAGNOSIS — G894 Chronic pain syndrome: Secondary | ICD-10-CM | POA: Diagnosis not present

## 2024-02-16 DIAGNOSIS — M542 Cervicalgia: Secondary | ICD-10-CM | POA: Diagnosis not present

## 2024-02-17 LAB — CULTURE, URINE COMPREHENSIVE

## 2024-02-20 ENCOUNTER — Emergency Department (HOSPITAL_COMMUNITY)
Admission: EM | Admit: 2024-02-20 | Discharge: 2024-02-20 | Discharge status: Against Medical Advice | Attending: Emergency Medicine | Admitting: Emergency Medicine

## 2024-02-20 ENCOUNTER — Other Ambulatory Visit: Payer: Self-pay

## 2024-02-20 ENCOUNTER — Emergency Department (HOSPITAL_COMMUNITY)

## 2024-02-20 DIAGNOSIS — R0602 Shortness of breath: Secondary | ICD-10-CM | POA: Insufficient documentation

## 2024-02-20 DIAGNOSIS — R112 Nausea with vomiting, unspecified: Secondary | ICD-10-CM

## 2024-02-20 DIAGNOSIS — Z5329 Procedure and treatment not carried out because of patient's decision for other reasons: Secondary | ICD-10-CM

## 2024-02-20 DIAGNOSIS — R202 Paresthesia of skin: Secondary | ICD-10-CM | POA: Diagnosis not present

## 2024-02-20 DIAGNOSIS — R251 Tremor, unspecified: Secondary | ICD-10-CM | POA: Diagnosis not present

## 2024-02-20 DIAGNOSIS — Z91010 Allergy to peanuts: Secondary | ICD-10-CM | POA: Insufficient documentation

## 2024-02-20 DIAGNOSIS — R21 Rash and other nonspecific skin eruption: Secondary | ICD-10-CM | POA: Diagnosis not present

## 2024-02-20 DIAGNOSIS — R350 Frequency of micturition: Secondary | ICD-10-CM | POA: Insufficient documentation

## 2024-02-20 DIAGNOSIS — R519 Headache, unspecified: Secondary | ICD-10-CM | POA: Insufficient documentation

## 2024-02-20 DIAGNOSIS — R3915 Urgency of urination: Secondary | ICD-10-CM | POA: Insufficient documentation

## 2024-02-20 DIAGNOSIS — Z5321 Procedure and treatment not carried out due to patient leaving prior to being seen by health care provider: Secondary | ICD-10-CM | POA: Diagnosis not present

## 2024-02-20 DIAGNOSIS — Z91040 Latex allergy status: Secondary | ICD-10-CM | POA: Insufficient documentation

## 2024-02-20 DIAGNOSIS — R197 Diarrhea, unspecified: Secondary | ICD-10-CM | POA: Diagnosis not present

## 2024-02-20 DIAGNOSIS — K529 Noninfective gastroenteritis and colitis, unspecified: Secondary | ICD-10-CM

## 2024-02-20 DIAGNOSIS — R1084 Generalized abdominal pain: Secondary | ICD-10-CM

## 2024-02-20 DIAGNOSIS — R9431 Abnormal electrocardiogram [ECG] [EKG]: Secondary | ICD-10-CM | POA: Diagnosis not present

## 2024-02-20 DIAGNOSIS — I10 Essential (primary) hypertension: Secondary | ICD-10-CM | POA: Diagnosis not present

## 2024-02-20 LAB — COMPREHENSIVE METABOLIC PANEL
ALT: 13 U/L (ref 0–44)
AST: 18 U/L (ref 15–41)
Albumin: 3.8 g/dL (ref 3.5–5.0)
Alkaline Phosphatase: 59 U/L (ref 38–126)
Anion gap: 6 (ref 5–15)
BUN: 8 mg/dL (ref 6–20)
CO2: 28 mmol/L (ref 22–32)
Calcium: 9.1 mg/dL (ref 8.9–10.3)
Chloride: 107 mmol/L (ref 98–111)
Creatinine, Ser: 0.94 mg/dL (ref 0.44–1.00)
GFR, Estimated: >60 mL/min (ref >60)
Glucose, Bld: 89 mg/dL (ref 70–99)
Potassium: 4.2 mmol/L (ref 3.5–5.1)
Sodium: 141 mmol/L (ref 135–145)
Total Bilirubin: 0.6 mg/dL (ref 0.0–1.2)
Total Protein: 6.1 g/dL — ABNORMAL LOW (ref 6.5–8.1)

## 2024-02-20 LAB — CBC
HCT: 42.2 % (ref 36.0–46.0)
Hemoglobin: 14 g/dL (ref 12.0–15.0)
MCH: 28.2 pg (ref 26.0–34.0)
MCHC: 33.2 g/dL (ref 30.0–36.0)
MCV: 84.9 fL (ref 80.0–100.0)
Platelets: 167 10*3/uL (ref 150–400)
RBC: 4.97 MIL/uL (ref 3.87–5.11)
RDW: 12.9 % (ref 11.5–15.5)
WBC: 5.9 10*3/uL (ref 4.0–10.5)
nRBC: 0 % (ref 0.0–0.2)

## 2024-02-20 LAB — URINALYSIS, W/ REFLEX TO CULTURE (INFECTION SUSPECTED)
Bilirubin Urine: NEGATIVE
Glucose, UA: NEGATIVE mg/dL
Hgb urine dipstick: NEGATIVE
Ketones, ur: NEGATIVE mg/dL
Leukocytes,Ua: NEGATIVE
Nitrite: NEGATIVE
Protein, ur: NEGATIVE mg/dL
RBC / HPF: NONE SEEN RBC/hpf (ref 0–5)
Specific Gravity, Urine: 1.015 (ref 1.005–1.030)
WBC, UA: NONE SEEN WBC/hpf (ref 0–5)
pH: 7.5 (ref 5.0–8.0)

## 2024-02-20 LAB — LIPASE, BLOOD: Lipase: 29 U/L (ref 11–51)

## 2024-02-20 MED ORDER — METOCLOPRAMIDE HCL 5 MG/ML IJ SOLN
10.0000 mg | Freq: Once | INTRAMUSCULAR | Status: DC
  Filled 2024-02-20: qty 2

## 2024-02-20 MED ORDER — LACTATED RINGERS IV BOLUS: 1000.0000 mL | Freq: Once | INTRAVENOUS | Status: DC

## 2024-02-20 MED ORDER — DIPHENHYDRAMINE HCL 50 MG/ML IJ SOLN
12.5000 mg | Freq: Once | INTRAMUSCULAR | Status: DC
  Filled 2024-02-20: qty 1

## 2024-02-20 NOTE — ED Provider Notes (Cosign Needed Addendum)
 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 States she has had chronic N/V/D for over a year. Seen by multiple GI providers with unclear cause. Multiple CT scans, prior EGD, colonoscopy. Taking colestipol without relief. Generalized abd pain. Alternates between constipation and diarrhea. No bloody stool. Passing gas. Taking zofran at home without relief.  She states she has lost over 100 pounds over 2 years due to only being able to eat rice and Carnation instant breakfast. States has chronic urgency, frequency of urine.  She had a UTI in the fall was given Macrobid.  She was seen again by urology about a week ago and was told to go on chronic suppression of Macrobid however most recent culture per patient was neg for infection. Chronic stress and urge incontinence.   Patient also notes that she has had a headache and tingling for many years.  States she has been seen by neurologist and "no one is helping me."  Symptoms are unchanged.  They are ongoing.  She states she has messaged this to her PCP as well as her neurologist.  She has chronic tingling to her hands and her feet which has been going on for at least 2 years.  She walks with a cane, history of Parkinson's, lupus..  She states she is overall very weak which has been going on for over a year as well.  She states she thinks she has had a wheeze.  No chest pain or shortness of breath.  She states she washes her hands frequently and there is no way she could have a viral process  She states she has been seen by pain management she takes hydrocodone however this has not been helping her pain she states she  was recently prescribed diazepam to help with her symptoms.   Patient tearful in room, difficult to get history out of.  She rambles about multiple complaints and states has been going on for years.  She sounds very frustrated that she has been to multiple riders multiple different specialists without any answer to why she is feeling so poorly.  She states she cannot tolerate many medications.  Reviewed PCP note from 12/27/23 with similar sx  HPI     Home Medications Prior to Admission medications   Medication Sig Start Date End Date Taking? Authorizing Provider  albuterol (PROVENTIL) (2.5 MG/3ML) 0.083% nebulizer solution Take 3 mLs (2.5 mg total) by nebulization every 6 (six) hours as needed for wheezing or shortness of breath. 08/11/22   McLean-Scocuzza, Pasty Spillers, MD  albuterol (VENTOLIN HFA) 108 (90 Base) MCG/ACT inhaler INHALE 1 TO 2 PUFFS INTO THE LUNGS EVERY 4 HOURS AS NEEDED FOR WHEEZING OR SHORTNESS OF BREATH 07/30/23   Allegra Grana, FNP  bismuth subsalicylate (PEPTO BISMOL) 262 MG chewable tablet Chew 524 mg by mouth as needed.    [provider]  calcium carbonate (TUMS EX) 750 MG chewable tablet Chew 1 tablet by mouth 3 (three) times daily as needed for heartburn.    [provider]  carbidopa-levodopa (SINEMET CR) 50-200 MG tablet Take 1 tablet by mouth 3 (three) times daily. 09/12/21   [provider]  colestipol (COLESTID) 1 g tablet Take 3 tablets (3 g total) by mouth 2 (two) times daily. Patient taking differently: Take 4 g by mouth 2 (two) times daily. 10/18/23   Remo Lipps, PA  dexlansoprazole (DEXILANT) 60 MG capsule Take 1 capsule (60 mg total) by mouth daily. 09/02/23   Remo Lipps, PA  diazepam (VALIUM) 5 MG tablet Take 2.5 mg by mouth 2 (two) times daily as needed for anxiety.    [provider]  diclofenac sodium (VOLTAREN) 1 % GEL Apply 2 g topically 4 (four) times daily as needed (for knee & finger pain.).      [provider]  dicyclomine (BENTYL) 20 MG tablet Take 1 tablet (20 mg total) by mouth every 6 (six) hours. Patient taking differently: Take 20 mg by mouth every 6 (six) hours. Pt is taking 3 times a daily 12/09/22   Allegra Grana, FNP  DULoxetine (CYMBALTA) 60 MG capsule Take 1 capsule (60 mg total) by mouth 2 (two) times daily. 08/09/23   Allegra Grana, FNP  EPINEPHrine 0.3 mg/0.3 mL IJ SOAJ injection Inject 0.3 mg into the muscle as needed for anaphylaxis. 02/08/24   McDonough, Salomon Fick, PA-C  fluticasone-salmeterol (ADVAIR DISKUS) 250-50 MCG/ACT AEPB Inhale 1 puff into the lungs in the morning and at bedtime. Rinse mouth after use 11/05/23   Remo Lipps, PA  HYDROcodone-acetaminophen (NORCO/VICODIN) 5-325 MG tablet Take 1 tablet by mouth 2 (two) times daily as needed. Take 2x"s daily 12/16/22   [provider]  hydroxychloroquine (PLAQUENIL) 200 MG tablet Plaquenil 200 mg tablet  Take 1 tablet twice a day by oral route after meals for 90 days.    [provider]  lactase (LACTAID) 3000 units tablet Take 3,000 Units by mouth 3 (three) times daily with meals.    [provider]  latanoprost (XALATAN) 0.005 % ophthalmic solution 1 drop at bedtime. 03/11/23   [provider]  levothyroxine (SYNTHROID) 75 MCG tablet TAKE 1 TABLET BY MOUTH DAILY 30 MINUTES BEFORE BREAKFAST 11/03/23   Remo Lipps, PA  LORazepam (ATIVAN) 2 MG tablet Take 1 mg by mouth at bedtime.    [provider]  meclizine (ANTIVERT) 12.5 MG tablet Take 12.5 mg by mouth 2 (two) times daily. 01/12/24   [provider]  montelukast (SINGULAIR) 10 MG tablet TAKE 1 TABLET BY MOUTH EVERY NIGHT AT BEDTIME 11/03/23   Remo Lipps, PA  nitrofurantoin (MACRODANTIN) 100 MG capsule Take 1 capsule (100 mg total) by mouth daily. 02/14/24 02/08/25  MacDiarmid, Lorin Picket, MD  ondansetron (ZOFRAN-ODT) 8 MG disintegrating tablet DISSOLVE 1 TABLET ON THE TONGUE EVERY 8 HOURS AS  NEEDED FOR NAUSEA OR VOMITING 01/17/24   McDonough, Lauren K, PA-C  PAXIL 20 MG tablet Take 20 mg by mouth daily. 08/18/23   [provider]  topiramate (TOPAMAX) 50 MG tablet Take 3 tablets (150 mg total) by mouth 2 (two) times daily. 11/05/23   Remo Lipps, PA  Zoledronic Acid (RECLAST IV)  12/01/17   [provider]      Allergies    Bee venom, Fish allergy, Latex, Peanut (diagnostic), Peanut-containing drug products, Prasterone, Shellfish allergy, Shellfish-derived products, Compazine [prochlorperazine edisylate], Dhea [nutritional supplements], Dilaudid [hydromorphone hcl], Famotidine, Gabapentin, Lactose, Lactose intolerance (gi), Other, Prochlorperazine, Promethazine, Promethazine hcl, Reclast [zoledronic acid], Clarithromycin, Clotrimazole, Hydromorphone hcl, and Penicillins    Review of Systems   Review of Systems  Constitutional:  Positive for activity change, appetite change and fatigue.  HENT: Negative.    Respiratory:  Positive for wheezing (Per patient).   Cardiovascular: Negative.   Gastrointestinal:  Positive for abdominal pain, constipation, diarrhea, nausea and vomiting. Negative for anal bleeding and blood in stool.       GI symptoms x years  Genitourinary:  Positive for frequency and urgency.  Musculoskeletal:  Positive for arthralgias, gait problem (chronic, uses cane) and myalgias.  Skin:  Positive for rash (chronic, lupus).  Neurological:  Positive for tremors (chronic, parkinsons), weakness (generalized), numbness (BIL hands and feet x 2 years) and headaches (chronic).  All other systems reviewed and are negative.   Physical Exam Updated Vital Signs BP 139/74   Pulse 82   Resp (!) 23   Ht 5\' 10"  (1.778 m)   Wt 76.2 kg   LMP 12/15/2015 Comment: neg urine test   SpO2 100%   BMI 24.11 kg/m  Physical Exam Vitals and nursing note reviewed.  Constitutional:      General: She is not in acute distress.    Appearance: She is well-developed. She  is ill-appearing (chronically ill appearing). She is not toxic-appearing or diaphoretic.     Comments: Tearful, crying throughout history and exam  HENT:     Head: Normocephalic and atraumatic.  Eyes:     General: No scleral icterus.    Extraocular Movements: Extraocular movements intact.  Cardiovascular:     Rate and Rhythm: Normal rate.     Heart sounds: Normal heart sounds.  Pulmonary:     Effort: Pulmonary effort is normal. No respiratory distress.     Breath sounds: Normal breath sounds.     Comments: Clear bil, Speaks without difficulty Abdominal:     General: Bowel sounds are normal. There is no distension.     Palpations: Abdomen is soft.     Tenderness: There is generalized abdominal tenderness.     Comments: Difficult exam, patient pulls my hands off her abdomen on exam  Musculoskeletal:        General: Normal range of motion.     Cervical back: Normal range of motion.  Skin:    General: Skin is warm and dry.     Capillary Refill: Capillary refill takes less than 2 seconds.     Findings: Rash present.     Comments: Dry skin to BIL anterior hands  Neurological:     Mental Status: She is alert.     Cranial Nerves: Cranial nerves 2-12 are intact.     Motor: Tremor present.     Comments: No obvious facial droop Follow commands Tremor bilateral upper extremities Ambulatory with cane, wide base gait while ambulating out the ED doors  Psychiatric:        Mood and Affect: Mood normal.    ED Results / Procedures / Treatments   Labs (all labs ordered are listed, but only abnormal results are displayed) Labs Reviewed  COMPREHENSIVE METABOLIC PANEL - Abnormal; Notable for the following components:      Result Value   Total Protein 6.1 (*)    All other components within normal limits  URINALYSIS, W/ REFLEX TO CULTURE (INFECTION SUSPECTED) - Abnormal; Notable for the following components:   Bacteria, UA RARE (*)    All other components within normal limits  RESP PANEL BY  RT-PCR (RSV, FLU A&B, COVID)  RVPGX2  LIPASE, BLOOD  CBC    EKG EKG Interpretation Date/Time:  Sunday February 20 2024 14:25:40 EDT Ventricular Rate:  77 PR Interval:  134 QRS Duration:  90 QT Interval:  401 QTC Calculation: 454 R Axis:   52  Text Interpretation: Sinus rhythm Low voltage, precordial leads Confirmed by Beckey Downing [phone] on 02/20/2024 2:48:29 PM  Radiology No results found.  Procedures Procedures    Medications Ordered in ED Medications - No data to display  ED Course/ Medical Decision Making/ A&P Clinical Course as of 02/20/24 2254  Sun Feb 20, 2024  1552 Patient refused chest xray even though she states she was wheezing.  No adventitious breath sounds on exam [BH]    Clinical Course User Index [BH] Muneeb Veras A, PA-C   58 year old with very complex medical history here for evaluation of multiple complaints. Chronic daily HA, chronic numbness, chronic nausea, vomiting, diarrhea, chronic abdominal pain here for evaluation of similar complaints. Has been going on for many years. She comes very frustrated in the room, tearful that she has been to many specialists with many healthcare systems and they have not been able to find an answer as to why she has been feeling unwell.  Patient difficult historian, difficult to actually ascertain her acute problem versus her recurrent chronic medical problems. It sounds like her issues have been ongoing. Did offer repeat labs, IV fluids, antiemetics, pain control, repeat some imaging here to ensure no acute pathology.  Labs personally viewed and interpreted:  UA neg for infection CBC without leukocytosis CMP without leukocytosis Lipase 29 Viral panel refused Chest xray refused CT AP refused   After leaving the room patient got upset. See nursing notes. She apparently did not want to see the PA and wants to see the physician.  She refused medications here in the emergency department as well as imaging. As nursing  was discussing with me that patient was upset, I went to reassess patient and she was seen ambulating out of the emergency department doors. Nursing discussed with patient and she was found to be leaving AGAINST MEDICAL ADVICE. I did not get to reassess patient prior to her elopement from the ED.                                 Medical Decision Making Amount and/or Complexity of Data Reviewed External Data Reviewed: labs, radiology and notes. Labs: ordered. Decision-making details documented in ED Course.  Risk Prescription drug management. Decision regarding hospitalization. Diagnosis or treatment significantly limited by social determinants of health.          Final Clinical Impression(s) / ED Diagnoses Final diagnoses:  Left against medical advice  Nausea and vomiting, unspecified vomiting type  Chronic diarrhea  Generalized abdominal pain    Rx / DC Orders ED Discharge Orders     None         Sasha Rueth A, PA-C 02/20/24 2255    Dhriti Fales A, PA-C 02/20/24 2258    Linwood Dibbles, MD 02/21/24 [phone]

## 2024-02-20 NOTE — ED Triage Notes (Signed)
 According to [facility]:  Pt coming home complain of abdominal pain with nausea/vomiting/ diarrhea. This is a repeat event, GI provider has been unable to find a direct cause. Pt has a hx of lupus and parkinson's.  Pt has had 16 mg of Zofran at home period to calling ems, as well as hydrocodone with little relief. Pt unable to eat or hydrate.  Pt had 50 ml of lr on way to hospital.  Vitals: CBG 81 158/98 Hr 80 100 spo2 on room air

## 2024-02-20 NOTE — ED Notes (Signed)
 Pt refused chest xray.  Pt sts she is not having any respiratory symptoms.  EDP made aware.

## 2024-02-20 NOTE — ED Notes (Signed)
 Patient refused Respiratory Panel swell. States "I am not coughing or sneezing or anything. I really don't want it. I wash my hands to death and wear a mask everywhere I go." Patient was educated on the importance of the test yet still refused.

## 2024-02-20 NOTE — ED Notes (Signed)
 While this Clinical research associate and Engineer, manufacturing systems were speaking to the PA, we noticed the Pt had gotten dressed and was walking toward the exit.  This writer asked the Pt if she was leaving and if so, we needed to remove her IV.  Pt stated she didn't want this writer touching her or speaking to her.  Pt informed we cannot let her leave with an IV and Pt proceeded to rip it out.  Pt provided with a gauze and shown to the lobby.  While walking to the lobby, Pt was noted to be mumbling about her care.

## 2024-02-20 NOTE — ED Notes (Signed)
 Francene Finders RN asked this Clinical research associate to speak to the Pt regarding her refusal of care.  Pt refusing ordered medications and CT.  Pt is continually stating the PA is giving her medications that she is allergic to and ordering things she does not need.  While this writer was checking the Pt's allergy list, she then stated Reglan isn't an allergy, it just "doesn't work."  Pt reports she came in via EMS "to see a Neurologist."  Pt informed she has to see ED staff first.  This writer attempted to clarify if she was only here to get a referral to Neurology which the Pt denied and then said she is here about her belly pain.  Pt admitted she has already been seen by several specialists about her abdominal pain and they cannot figure anything out.  Pt informed it's difficult for the ED to manage chronic pain because we are here to rule out emergencies and have patients follow-up with specialists.  Pt continued to voice frustration over being ordered medications which she is allergic to and again, this writer clarified she isn't allergic to Reglan.  Additionally, Pt voiced frustration about always being seen by a PA and requesting to see a Doctor.  This Clinical research associate attempted to explain that the PA works directly with a Librarian, academic, but Pt started talking over this Clinical research associate.  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  This Clinical research associate informed the Pt we would let the PA know about her refusals/concerns and walked out of the room.

## 2024-02-20 NOTE — ED Notes (Signed)
 Pt reports losing 100 pounds over 2 years however its been speeding up.

## 2024-02-24 ENCOUNTER — Encounter (HOSPITAL_COMMUNITY): Payer: Self-pay

## 2024-02-26 ENCOUNTER — Other Ambulatory Visit: Payer: Self-pay | Admitting: Physician Assistant

## 2024-02-26 DIAGNOSIS — G629 Polyneuropathy, unspecified: Secondary | ICD-10-CM

## 2024-02-28 ENCOUNTER — Ambulatory Visit (INDEPENDENT_AMBULATORY_CARE_PROVIDER_SITE_OTHER): Admitting: Physician Assistant

## 2024-02-28 ENCOUNTER — Encounter: Payer: Self-pay | Admitting: Physician Assistant

## 2024-02-28 VITALS — BP 135/80 | HR 74 | Temp 97.8°F | Resp 16 | Ht 68.0 in | Wt 168.2 lb

## 2024-02-28 DIAGNOSIS — R197 Diarrhea, unspecified: Secondary | ICD-10-CM | POA: Diagnosis not present

## 2024-02-28 DIAGNOSIS — F419 Anxiety disorder, unspecified: Secondary | ICD-10-CM

## 2024-02-28 DIAGNOSIS — F32A Depression, unspecified: Secondary | ICD-10-CM | POA: Diagnosis not present

## 2024-02-28 DIAGNOSIS — R112 Nausea with vomiting, unspecified: Secondary | ICD-10-CM | POA: Diagnosis not present

## 2024-02-28 NOTE — Progress Notes (Signed)
 [facility] [address] [place], [place] [address]  Internal MEDICINE  Office Visit Note  Patient Name: [name] [name], [place]  [id number]  [id number]  Date of Service: 03/06/2024  Chief Complaint  Patient presents with   Follow-up   Depression   Gastroesophageal Reflux    HPI Pt is here for routine follow up -saw psych in sept in person, since then has had trouble getting follow ups -could not get through to office, only got through via Child psychotherapist -Would like referral to new psych location--[facility] for psychiatry and therapy together -Will be seeing a neurology provider on Wednesday at [facility] for MS evaluation -Did have poor experience in ED a few weeks ago for abdominal pain, states they attempted to give her medication she cannot take and was trying to order tests she did not feel she needed given her chief complaint. Ended up leaving AMA -[facility], but [facility] until Aug -seeing urology now  Current Medication: Outpatient Encounter Medications as of 02/28/2024  Medication Sig Note   albuterol (PROVENTIL) (2.5 MG/3ML) 0.083% nebulizer solution Take 3 mLs (2.5 mg total) by nebulization every 6 (six) hours as needed for wheezing or shortness of breath.    albuterol (VENTOLIN HFA) 108 (90 Base) MCG/ACT inhaler INHALE 1 TO 2 PUFFS INTO THE LUNGS EVERY 4 HOURS AS NEEDED FOR WHEEZING OR SHORTNESS OF BREATH    bismuth subsalicylate (PEPTO BISMOL) 262 MG chewable tablet Chew 524 mg by mouth as needed.    calcium carbonate (TUMS EX) 750 MG chewable tablet Chew 1 tablet by mouth 3 (three) times daily as needed for heartburn.    carbidopa-levodopa (SINEMET CR) 50-200 MG tablet Take 1 tablet by mouth 3 (three) times daily.    colestipol (COLESTID) 1 g tablet Take 3 tablets (3 g total) by mouth 2 (two) times daily. (Patient taking differently: Take 4 g by mouth 2 (two) times daily.)    dexlansoprazole (DEXILANT) 60 MG capsule Take 1 capsule (60  mg total) by mouth daily.    diazepam (VALIUM) 5 MG tablet Take 2.5 mg by mouth 2 (two) times daily as needed for anxiety.    diclofenac sodium (VOLTAREN) 1 % GEL Apply 2 g topically 4 (four) times daily as needed (for knee & finger pain.).     dicyclomine (BENTYL) 20 MG tablet Take 1 tablet (20 mg total) by mouth every 6 (six) hours. (Patient taking differently: Take 20 mg by mouth every 6 (six) hours. Pt is taking 3 times a daily) 11/05/2023: Taking three times daily as needed for abdominal pain/cramping   DULoxetine (CYMBALTA) 60 MG capsule Take 1 capsule (60 mg total) by mouth 2 (two) times daily.    EPINEPHrine 0.3 mg/0.3 mL IJ SOAJ injection Inject 0.3 mg into the muscle as needed for anaphylaxis.    fluticasone-salmeterol (ADVAIR DISKUS) 250-50 MCG/ACT AEPB Inhale 1 puff into the lungs in the morning and at bedtime. Rinse mouth after use    HYDROcodone-acetaminophen (NORCO/VICODIN) 5-325 MG tablet Take 1 tablet by mouth 2 (two) times daily as needed. Take 2x"s daily    hydroxychloroquine (PLAQUENIL) 200 MG tablet Plaquenil 200 mg tablet  Take 1 tablet twice a day by oral route after meals for 90 days.    lactase (LACTAID) 3000 units tablet Take 3,000 Units by mouth 3 (three) times daily with meals.    latanoprost (XALATAN) 0.005 % ophthalmic solution 1 drop at bedtime.    levothyroxine (SYNTHROID) 75 MCG  tablet TAKE 1 TABLET BY MOUTH DAILY 30 MINUTES BEFORE BREAKFAST    LORazepam (ATIVAN) 2 MG tablet Take 1 mg by mouth at bedtime.    meclizine (ANTIVERT) 12.5 MG tablet Take 12.5 mg by mouth 2 (two) times daily.    montelukast (SINGULAIR) 10 MG tablet TAKE 1 TABLET BY MOUTH EVERY NIGHT AT BEDTIME    nitrofurantoin (MACRODANTIN) 100 MG capsule Take 1 capsule (100 mg total) by mouth daily.    ondansetron (ZOFRAN-ODT) 8 MG disintegrating tablet DISSOLVE 1 TABLET ON THE TONGUE EVERY 8 HOURS AS NEEDED FOR NAUSEA OR VOMITING    PAXIL 20 MG tablet Take 20 mg by mouth daily.    topiramate (TOPAMAX)  50 MG tablet Take 3 tablets (150 mg total) by mouth 2 (two) times daily.    Zoledronic Acid (RECLAST IV)     No facility-administered encounter medications on file as of 02/28/2024.    Surgical History: Past Surgical History:  Procedure Laterality Date   ADENOIDECTOMY     APPENDECTOMY     CERVICAL BIOPSY  W/ LOOP ELECTRODE EXCISION  2005   CHOLECYSTECTOMY     COLONOSCOPY N/A 10/16/2022   Procedure: COLONOSCOPY;  Surgeon: Regis Bill, MD;  Location: [facility] ENDOSCOPY;  Service: Gastroenterology;  Laterality: N/A;   ESOPHAGOGASTRODUODENOSCOPY N/A 10/16/2022   Procedure: ESOPHAGOGASTRODUODENOSCOPY (EGD);  Surgeon: Regis Bill, MD;  Location: [facility] ENDOSCOPY;  Service: Gastroenterology;  Laterality: N/A;   ESOPHAGOGASTRODUODENOSCOPY (EGD) WITH PROPOFOL N/A 08/27/2017   Procedure: ESOPHAGOGASTRODUODENOSCOPY (EGD) WITH PROPOFOL;  Surgeon: Scot Jun, MD;  Location: [facility] ENDOSCOPY;  Service: Endoscopy;  Laterality: N/A;   FRACTURE SURGERY  11/30/2008   HAND SURGERY     repair of left metatarsal    HARDWARE REMOVAL Right 02/16/2017   Procedure: HARDWARE REMOVAL FROM HIP;  Surgeon: Kennedy Bucker, MD;  Location: [facility] ORS;  Service: Orthopedics;  Laterality: Right;   JOINT REPLACEMENT     OTHER SURGICAL HISTORY     left 3rd metatarsal fracture repair 2011    REVISION TOTAL HIP ARTHROPLASTY  06/2009   replacement 2010 then screw and plate removal 4098; right hip   TONSILLECTOMY     TONSILLECTOMY     wrist  ligament repair bilateral     WRIST RECONSTRUCTION     WRIST SURGERY     laceration of right wrist ligaments   wrist surgery     laceration of left wrist surgery     Medical History: Past Medical History:  Diagnosis Date   ADHD    Allergy 12/30/2018   Seasonal   Anemia    ANXIETY 06/06/2007   Arthritis    related to lupus   Asthma    BACK PAIN 02/22/2009   Cervical cancer (HCC)    LEEP 2005    Chicken pox    Chronic pain    goes to pain management  provider   Collagen vascular disease (HCC)    Connective tissue disease (HCC)    DEPRESSION 06/06/2007   Essential tremor    Fibromyalgia    Fibromyalgia    FREQUENCY, URINARY 07/07/2007   Gastritis    GERD 06/06/2007   Glaucoma    Headache    h/o migraines    History of hiatal hernia    History of kidney stones    HYPOTHYROIDISM 06/06/2007   INTERSTITIAL CYSTITIS 09/11/2010   Lupus 2006   Lupus    Menopause    Migraine    Mitral valve regurgitation    NEPHROLITHIASIS, HX OF  11/05/2010   Neuropathy    Neuropathy    OCD (obsessive compulsive disorder)    Optic neuritis    2005   OSTEOPENIA 10/14/2009   Osteoporosis    OVERACTIVE BLADDER 10/14/2009   PAIN, CHRONIC NEC 06/06/2007   Panniculitis    Parkinson disease (HCC) 04/03/2019   Parkinson disease (HCC)    POLYARTHRITIS 08/19/2007   Raynaud's disease    Sicca syndrome (HCC)    Ulcer 11/30/2017   Don't actually know date   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 Valentino Saxon [facility]    Vasculitis [facility])    WEIGHT GAIN 02/22/2009    Family History: Family History  Problem Relation Age of Onset   Hypertension Mother    Arthritis Mother    Heart disease Mother        afib   Asthma Mother    Asthma Sister    Asthma Daughter    Arthritis Daughter    Heart disease Daughter        ?heart condition on BB   ADD / ADHD Daughter    Depression Daughter    Intellectual disability Daughter    Learning disabilities Daughter    Pancreatic cancer Father    Cancer Father        pancreatitic    Diabetes Maternal Grandmother    Heart disease Maternal Grandmother    Arthritis Maternal Grandmother    Hypertension Maternal Grandmother    Dementia Maternal Grandmother    Colon cancer Maternal Uncle 45   Crohn's disease Maternal Aunt    Breast cancer Maternal Aunt 37   Diabetes Maternal Aunt    Breast cancer Cousin 75       maternal   Cancer Cousin        m cousin breat cancer s/p removal both  breasts     Social History   Socioeconomic History   Marital status: Divorced    Spouse name: Not on file   Number of children: 2   Years of education: Not on file   Highest education level: Professional school degree (e.g., MD, DDS, DVM, JD)  Occupational History   Occupation: DISABLED    Employer: UNEMPLOYED  Tobacco Use   Smoking status: Never   Smokeless tobacco: Never  Vaping Use   Vaping status: Never Used  Substance and Sexual Activity   Alcohol use: No   Drug use: No   Sexual activity: Not Currently    Birth control/protection: Post-menopausal, None  Other Topics Concern   Not on file  Social History Narrative   She was previously a pediatrician, 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     She lives with daughter and mother      1 living child    Social Drivers of Corporate investment banker Strain: Low Risk  (12/15/2023)   Received from [facility]   Overall Financial Resource Strain (CARDIA)    Difficulty of Paying Living Expenses: Not hard at all  Recent Concern: Financial Resource Strain - Medium Risk (11/18/2023)   Received from [facility]   Overall Financial Resource Strain (CARDIA)    Difficulty of Paying Living Expenses: Somewhat hard  Food Insecurity: No Food Insecurity (12/15/2023)   Received [facility]   Hunger Vital Sign    Ran Out of Food in the Last Year: Never true  Worried About Programme researcher, broadcasting/film/video in the Last Year: Never true  Recent Concern: Food Insecurity - Food Insecurity Present (11/18/2023)   Received from [facility]   Hunger Vital Sign    Worried About Running Out of Food in the Last Year: Sometimes true    Ran Out of Food in the Last Year: Sometimes true  Transportation Needs: No Transportation Needs (12/15/2023)   Received from [facility]   PRAPARE -  Transportation    Lack of Transportation (Non-Medical): No    In the past 12 months, has lack of transportation kept you from medical appointments or from getting medications?: No  Physical Activity: Inactive (11/18/2023)   Received from [facility]   Exercise Vital Sign    Minutes of Exercise per Session: 0 min    Days of Exercise per Week: 0 days  Stress: Stress Concern Present (11/18/2023)   Received from [facility] - Occupational Stress Questionnaire    Feeling of Stress : To some extent  Social Connections: Socially Isolated (11/18/2023)   Received from [facility]   Social Connection and Isolation Panel [NHANES]    Frequency of Communication with Friends and Family: Never    Frequency of Social Gatherings with Friends and Family: Never    Attends Religious Services: Never    Database administrator or Organizations: No    Attends Banker Meetings: Never    Marital Status: Divorced  Catering manager Violence: Not At Risk (11/08/2023)   Humiliation, Afraid, Rape, and Kick questionnaire    Fear of Current or Ex-Partner: No    Emotionally Abused: No    Physically Abused: No    Sexually Abused: No      Review of Systems  Constitutional:  Positive for appetite change, fatigue and unexpected weight change. Negative for chills.  HENT:  Negative for congestion, rhinorrhea, sneezing and sore throat.   Respiratory:  Negative for chest tightness.   Cardiovascular:  Negative for chest pain.  Gastrointestinal:  Positive for abdominal pain, diarrhea and nausea. Negative for constipation.  Genitourinary:  Negative for dysuria.  Musculoskeletal:  Positive for arthralgias and back pain. Negative for joint swelling and neck pain.  Skin:  Negative for rash.  Neurological:  Positive for numbness.  Hematological:  Negative for adenopathy. Does not bruise/bleed easily.   Psychiatric/Behavioral:  Positive for dysphoric mood. Negative for suicidal ideas. Behavioral problem: Depression.The patient is nervous/anxious.     Vital Signs: BP 135/80   Pulse 74   Temp 97.8 F (36.6 C)   Resp 16   Ht 5\' 8"  (1.727 m)   Wt 168 lb 3.2 oz (76.3 kg)   LMP 12/15/2015 Comment: neg urine test   SpO2 98%   BMI 25.57 kg/m    Physical Exam Vitals and nursing note reviewed.  Constitutional:      Appearance: She is not toxic-appearing.  HENT:     Head: Normocephalic and atraumatic.  Eyes:     Extraocular Movements: Extraocular movements intact.  Cardiovascular:     Rate and Rhythm: Normal rate and regular rhythm.  Pulmonary:     Effort: Pulmonary effort is normal.     Breath sounds: Normal breath sounds.  Skin:    General: Skin is warm and dry.  Neurological:     Mental Status: She is alert.  Psychiatric:        Thought Content: Thought content  normal.        Assessment/Plan: 1. Anxiety and depression (Primary) Will send new referral for psychiatry and therapy - Ambulatory referral to Psychiatry  2. Nausea vomiting and diarrhea Has appt with new GI provider for chronic GI concerns   General Counseling: Timmy verbalizes understanding of the findings of todays visit and agrees with plan of treatment. I have discussed any further diagnostic evaluation that may be needed or ordered today. We also reviewed her medications today. she has been encouraged to call the office with any questions or concerns that should arise related to todays visit.    Orders Placed This Encounter  Procedures   Ambulatory referral to Psychiatry    No orders of the defined types were placed in this encounter.   This patient was seen by Lynn Ito, PA-C in collaboration with Dr. Beverely Risen as a part of collaborative care agreement.   Total time spent:30 Minutes Time spent includes review of chart, medications, test results, and follow up plan with the patient.       Dr Lyndon Code Internal medicine

## 2024-02-29 DIAGNOSIS — C50911 Malignant neoplasm of unspecified site of right female breast: Secondary | ICD-10-CM | POA: Insufficient documentation

## 2024-03-01 ENCOUNTER — Telehealth: Payer: Self-pay | Admitting: Physician Assistant

## 2024-03-01 DIAGNOSIS — I776 Arteritis, unspecified: Secondary | ICD-10-CM | POA: Diagnosis not present

## 2024-03-01 DIAGNOSIS — M329 Systemic lupus erythematosus, unspecified: Secondary | ICD-10-CM | POA: Diagnosis not present

## 2024-03-01 DIAGNOSIS — R200 Anesthesia of skin: Secondary | ICD-10-CM | POA: Diagnosis not present

## 2024-03-01 DIAGNOSIS — G629 Polyneuropathy, unspecified: Secondary | ICD-10-CM | POA: Diagnosis not present

## 2024-03-01 DIAGNOSIS — R202 Paresthesia of skin: Secondary | ICD-10-CM | POA: Diagnosis not present

## 2024-03-01 DIAGNOSIS — G20C Parkinsonism, unspecified: Secondary | ICD-10-CM | POA: Diagnosis not present

## 2024-03-01 NOTE — Telephone Encounter (Signed)
 Awaiting 02/28/24 office notes for Psychiatry referral-Toni

## 2024-03-03 ENCOUNTER — Encounter: Payer: Self-pay | Admitting: Physician Assistant

## 2024-03-03 NOTE — Telephone Encounter (Signed)
 Rheumatolgy annd vascular

## 2024-03-06 ENCOUNTER — Other Ambulatory Visit: Payer: Self-pay | Admitting: Physician Assistant

## 2024-03-06 DIAGNOSIS — G629 Polyneuropathy, unspecified: Secondary | ICD-10-CM

## 2024-03-06 DIAGNOSIS — M79606 Pain in leg, unspecified: Secondary | ICD-10-CM

## 2024-03-06 DIAGNOSIS — I872 Venous insufficiency (chronic) (peripheral): Secondary | ICD-10-CM

## 2024-03-07 ENCOUNTER — Telehealth: Payer: Self-pay | Admitting: Physician Assistant

## 2024-03-07 NOTE — Telephone Encounter (Signed)
 Vascular referral sent via Epic to [facility]. Sent message to patient-[name] [name]

## 2024-03-07 NOTE — Telephone Encounter (Signed)
 Psychiatric referral faxed to [facility] per patient; [phone]. Lvm notifying patient. Gave pt telephone # [phone]

## 2024-03-15 DIAGNOSIS — R110 Nausea: Secondary | ICD-10-CM | POA: Diagnosis not present

## 2024-03-15 DIAGNOSIS — M545 Low back pain, unspecified: Secondary | ICD-10-CM | POA: Diagnosis not present

## 2024-03-15 DIAGNOSIS — N6310 Unspecified lump in the right breast, unspecified quadrant: Secondary | ICD-10-CM | POA: Diagnosis not present

## 2024-03-15 DIAGNOSIS — R197 Diarrhea, unspecified: Secondary | ICD-10-CM | POA: Diagnosis not present

## 2024-03-15 DIAGNOSIS — R109 Unspecified abdominal pain: Secondary | ICD-10-CM | POA: Diagnosis not present

## 2024-03-15 DIAGNOSIS — Z5181 Encounter for therapeutic drug level monitoring: Secondary | ICD-10-CM | POA: Diagnosis not present

## 2024-03-16 DIAGNOSIS — R002 Palpitations: Secondary | ICD-10-CM | POA: Diagnosis not present

## 2024-03-16 DIAGNOSIS — M329 Systemic lupus erythematosus, unspecified: Secondary | ICD-10-CM | POA: Diagnosis not present

## 2024-03-16 DIAGNOSIS — R112 Nausea with vomiting, unspecified: Secondary | ICD-10-CM | POA: Diagnosis not present

## 2024-03-16 DIAGNOSIS — K2100 Gastro-esophageal reflux disease with esophagitis, without bleeding: Secondary | ICD-10-CM | POA: Diagnosis not present

## 2024-03-16 DIAGNOSIS — R197 Diarrhea, unspecified: Secondary | ICD-10-CM | POA: Diagnosis not present

## 2024-03-16 DIAGNOSIS — R252 Cramp and spasm: Secondary | ICD-10-CM | POA: Diagnosis not present

## 2024-03-21 ENCOUNTER — Encounter: Payer: Self-pay | Admitting: Urology

## 2024-03-22 NOTE — Addendum Note (Signed)
 Addended by: Deborra Falter on: 03/22/2024 04:33 PM   Modules accepted: Orders

## 2024-03-24 ENCOUNTER — Encounter

## 2024-03-24 DIAGNOSIS — R3589 Other polyuria: Secondary | ICD-10-CM | POA: Diagnosis not present

## 2024-03-24 DIAGNOSIS — M542 Cervicalgia: Secondary | ICD-10-CM | POA: Diagnosis not present

## 2024-03-27 DIAGNOSIS — Z1721 Progesterone receptor positive status: Secondary | ICD-10-CM | POA: Diagnosis not present

## 2024-03-27 DIAGNOSIS — N6315 Unspecified lump in the right breast, overlapping quadrants: Secondary | ICD-10-CM | POA: Diagnosis not present

## 2024-03-27 DIAGNOSIS — D241 Benign neoplasm of right breast: Secondary | ICD-10-CM | POA: Diagnosis not present

## 2024-03-27 DIAGNOSIS — Z659 Problem related to unspecified psychosocial circumstances: Secondary | ICD-10-CM | POA: Diagnosis not present

## 2024-03-27 DIAGNOSIS — N6314 Unspecified lump in the right breast, lower inner quadrant: Secondary | ICD-10-CM | POA: Diagnosis not present

## 2024-03-27 DIAGNOSIS — N6313 Unspecified lump in the right breast, lower outer quadrant: Secondary | ICD-10-CM | POA: Diagnosis not present

## 2024-03-27 DIAGNOSIS — Z170 Estrogen receptor positive status [ER+]: Secondary | ICD-10-CM | POA: Diagnosis not present

## 2024-03-27 DIAGNOSIS — N6310 Unspecified lump in the right breast, unspecified quadrant: Secondary | ICD-10-CM | POA: Diagnosis not present

## 2024-03-27 DIAGNOSIS — R922 Inconclusive mammogram: Secondary | ICD-10-CM | POA: Diagnosis not present

## 2024-03-27 DIAGNOSIS — C50311 Malignant neoplasm of lower-inner quadrant of right female breast: Secondary | ICD-10-CM | POA: Diagnosis not present

## 2024-03-29 ENCOUNTER — Telehealth: Payer: Self-pay | Admitting: Obstetrics and Gynecology

## 2024-03-29 ENCOUNTER — Encounter: Payer: Self-pay | Admitting: Podiatry

## 2024-03-29 NOTE — Telephone Encounter (Signed)
 2 days ago Dr. Ora Billing called the office and was wanting to inquire of Dr. Denman Fischer how her breast cancer had been missed.  I returned her call yesterday and we were finally able to speak today.  She was quite tearful during our conversation and had some concerns regarding her previous CT scans and mammography that reportedly did not show a breast cancer which she says is quite obvious on the most recent studies.  She was wondering how breast cancer of the size could be missed.  Of course not being there for her examination or even reading any of her mammography or CTs I cannot answer these questions.  We discussed how it is often possible for radiologist to go backwards and look at films once they know there is a lesion they can see a previous lesion that was seemingly not noticed before.  We also discussed the fact that her CTs and mammography could be reviewed retrospectively.  We discussed the possibility of rapidly growing breast cancer that had appeared between her visit for mammography 1 year ago and her current mammogram.  We discussed the fact that as her workup goes forward and the type and extent of breast cancer becomes more evident some of these facts will become clear.  I answered all of her questions as best I could and I have offered that if she would like to contact me again in the future to further discuss anything or ask questions I would be happy to listen.

## 2024-03-30 ENCOUNTER — Encounter: Payer: Self-pay | Admitting: Obstetrics

## 2024-04-04 ENCOUNTER — Encounter (INDEPENDENT_AMBULATORY_CARE_PROVIDER_SITE_OTHER): Admitting: Nurse Practitioner

## 2024-04-14 ENCOUNTER — Encounter (INDEPENDENT_AMBULATORY_CARE_PROVIDER_SITE_OTHER): Payer: Self-pay | Admitting: Nurse Practitioner

## 2024-04-14 ENCOUNTER — Ambulatory Visit (INDEPENDENT_AMBULATORY_CARE_PROVIDER_SITE_OTHER): Admitting: Nurse Practitioner

## 2024-04-14 VITALS — BP 135/77 | HR 85 | Resp 16 | Ht 70.0 in | Wt 164.0 lb

## 2024-04-14 DIAGNOSIS — R1084 Generalized abdominal pain: Secondary | ICD-10-CM | POA: Diagnosis not present

## 2024-04-14 DIAGNOSIS — R42 Dizziness and giddiness: Secondary | ICD-10-CM | POA: Diagnosis not present

## 2024-04-14 DIAGNOSIS — C50919 Malignant neoplasm of unspecified site of unspecified female breast: Secondary | ICD-10-CM | POA: Insufficient documentation

## 2024-04-14 DIAGNOSIS — I776 Arteritis, unspecified: Secondary | ICD-10-CM | POA: Diagnosis not present

## 2024-04-14 DIAGNOSIS — C50911 Malignant neoplasm of unspecified site of right female breast: Secondary | ICD-10-CM

## 2024-04-16 ENCOUNTER — Encounter (INDEPENDENT_AMBULATORY_CARE_PROVIDER_SITE_OTHER): Payer: Self-pay | Admitting: Nurse Practitioner

## 2024-04-16 NOTE — Progress Notes (Signed)
 Subjective:    Patient ID: [name] Downing, MD, female    DOB: 25-Jul-1966, 58 y.o.   MRN: [id number] Chief Complaint  Patient presents with   New Patient (Initial Visit)    Consult for pain and swelling in legs    The patient was referred today for evaluation of pain and swelling in her lower extremities but notes that she is not having any of those symptoms.  However she notes that the issue and concern is related to possible vasculitis.  The patient has had a number of months where she has been losing weight with out a significant cause.  She also has a history of lupus and it was thought that some of her symptoms may be related to vasculitis.  She notes that she has significant pain with eating.  She notes that she has had instances of diarrhea as well as significant nausea.  Over the last several months she has dropped significant weight without trying at all.  She has developed significant food phobia.  However the patient has also recently been diagnosed with breast cancer as well.    Review of Systems  Gastrointestinal:  Positive for abdominal pain, diarrhea and nausea.  Neurological:  Positive for dizziness.  All other systems reviewed and are negative.      Objective:    Physical Exam Vitals reviewed.  HENT:     Head: Normocephalic.  Cardiovascular:     Rate and Rhythm: Normal rate.  Pulmonary:     Effort: Pulmonary effort is normal.  Skin:    General: Skin is warm and dry.  Neurological:     Mental Status: She is alert and oriented to person, place, and time.  Psychiatric:        Mood and Affect: Mood normal.        Behavior: Behavior normal.        Thought Content: Thought content normal.        Judgment: Judgment normal.     BP 135/77 (BP Location: Left Arm)   Pulse 85   Resp 16   Ht 5\' 10"  (1.778 m)   Wt 164 lb (74.4 kg)   LMP 12/15/2015 Comment: neg urine test   BMI 23.53 kg/m   Past Medical History:  Diagnosis Date   ADHD    Allergy  12/30/2018   Seasonal   Anemia    ANXIETY 06/06/2007   Arthritis    related to lupus   Asthma    BACK PAIN 02/22/2009   Breast cancer (HCC)    Cervical cancer (HCC)    LEEP 2005    Chicken pox    Chronic pain    goes to pain management provider   Collagen vascular disease (HCC)    Connective tissue disease (HCC)    DEPRESSION 06/06/2007   Essential tremor    Fibromyalgia    Fibromyalgia    FREQUENCY, URINARY 07/07/2007   Gastritis    GERD 06/06/2007   Glaucoma    Headache    h/o migraines    History of hiatal hernia    History of kidney stones    HYPOTHYROIDISM 06/06/2007   INTERSTITIAL CYSTITIS 09/11/2010   Lupus 2006   Lupus    Menopause    Migraine    Mitral valve regurgitation    NEPHROLITHIASIS, HX OF 11/05/2010   Neuropathy    Neuropathy    OCD (obsessive compulsive disorder)    Optic neuritis    2005   OSTEOPENIA 10/14/2009  Osteoporosis    OVERACTIVE BLADDER 10/14/2009   PAIN, CHRONIC NEC 06/06/2007   Panniculitis    Parkinson disease (HCC) 04/03/2019   Parkinson disease (HCC)    POLYARTHRITIS 08/19/2007   Raynaud's disease    Sicca syndrome (HCC)    Ulcer 11/30/2017   Don't actually know date   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 Denman [facility]    Vasculitis [facility])    WEIGHT GAIN 02/22/2009    Social History   Socioeconomic History   Marital status: Divorced    Spouse name: Not on file   Number of children: 2   Years of education: Not on file   Highest education level: Professional school degree (e.g., MD, DDS, DVM, JD)  Occupational History   Occupation: DISABLED    Employer: UNEMPLOYED  Tobacco Use   Smoking status: Never   Smokeless tobacco: Never  Vaping Use   Vaping status: Never Used  Substance and Sexual Activity   Alcohol  use: No   Drug use: No   Sexual activity: Not Currently    Birth control/protection: Post-menopausal, None  Other Topics Concern   Not on file  Social History Narrative    She was previously a pediatrician, 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     She lives with daughter and mother      1 living child    Social Drivers of Corporate investment banker Strain: Low Risk  (12/15/2023)   Received from [facility]   Overall Financial Resource Strain (CARDIA)    Difficulty of Paying Living Expenses: Not hard at all  Recent Concern: Financial Resource Strain - Medium Risk (11/18/2023)   Received from [facility]   Overall Financial Resource Strain (CARDIA)    Difficulty of Paying Living Expenses: Somewhat hard  Food Insecurity: Medium Risk (03/13/2024)   Received from [facility]   Hunger Vital Sign    Worried About Running Out of Food in the Last Year: Sometimes true    Ran Out of Food in the Last Year: Never true  Transportation Needs: No Transportation Needs (03/13/2024)   Received from [facility]    In the past 12 months, has lack of reliable transportation kept you from medical appointments, meetings, work or from getting things needed for daily living? : No  Physical Activity: Inactive (11/18/2023)   Received from [facility]   Exercise Vital Sign    Minutes of Exercise per Session: 0 min    Days of Exercise per Week: 0 days  Stress: Stress Concern Present (11/18/2023)   Received from [facility] - Occupational Stress Questionnaire    Feeling of Stress : To some extent  Social Connections: Socially Isolated (11/18/2023)   Received from [facility]   Social Connection and Isolation Panel [NHANES]    Frequency of Communication with Friends and Family: Never    Frequency of Social Gatherings with Friends and Family: Never    Attends Religious Services: Never    Database administrator or Organizations: No    Attends Tax inspector Meetings: Never    Marital Status: Divorced  Catering manager Violence: Not At Risk (11/08/2023)   Humiliation, Afraid, Rape, and Kick questionnaire    Fear of Current or Ex-Partner: No    Emotionally  Abused: No    Physically Abused: No    Sexually Abused: No    Past Surgical History:  Procedure Laterality Date   ADENOIDECTOMY     APPENDECTOMY     CERVICAL BIOPSY  W/ LOOP ELECTRODE EXCISION  2005   CHOLECYSTECTOMY     COLONOSCOPY N/A 10/16/2022   Procedure: COLONOSCOPY;  Surgeon: Shane Darling, MD;  Location: [facility] ENDOSCOPY;  Service: Gastroenterology;  Laterality: N/A;   ESOPHAGOGASTRODUODENOSCOPY N/A 10/16/2022   Procedure: ESOPHAGOGASTRODUODENOSCOPY (EGD);  Surgeon: Shane Darling, MD;  Location: [facility] ENDOSCOPY;  Service: Gastroenterology;  Laterality: N/A;   ESOPHAGOGASTRODUODENOSCOPY (EGD) WITH PROPOFOL  N/A 08/27/2017   Procedure: ESOPHAGOGASTRODUODENOSCOPY (EGD) WITH PROPOFOL ;  Surgeon: Cassie Click, MD;  Location: [facility] ENDOSCOPY;  Service: Endoscopy;  Laterality: N/A;   FRACTURE SURGERY  11/30/2008   HAND SURGERY     repair of left metatarsal    HARDWARE REMOVAL Right 02/16/2017   Procedure: HARDWARE REMOVAL FROM HIP;  Surgeon: Molli Angelucci, MD;  Location: [facility] ORS;  Service: Orthopedics;  Laterality: Right;   JOINT REPLACEMENT     OTHER SURGICAL HISTORY     left 3rd metatarsal fracture repair 2011    REVISION TOTAL HIP ARTHROPLASTY  06/2009   replacement 2010 then screw and plate removal 4098; right hip   TONSILLECTOMY     TONSILLECTOMY     wrist  ligament repair bilateral     WRIST RECONSTRUCTION     WRIST SURGERY     laceration of right wrist ligaments   wrist surgery     laceration of left wrist surgery     Family History  Problem Relation Age of Onset   Hypertension Mother    Arthritis Mother    Heart disease Mother        afib   Asthma Mother    Asthma Sister    Asthma Daughter    Arthritis Daughter    Heart disease Daughter         ?heart condition on BB   ADD / ADHD Daughter    Depression Daughter    Intellectual disability Daughter    Learning disabilities Daughter    Pancreatic cancer Father    Cancer Father        pancreatitic    Diabetes Maternal Grandmother    Heart disease Maternal Grandmother    Arthritis Maternal Grandmother    Hypertension Maternal Grandmother    Dementia Maternal Grandmother    Colon cancer Maternal Uncle 45   Crohn's disease Maternal Aunt    Breast cancer Maternal Aunt 65   Diabetes Maternal Aunt    Breast cancer Cousin 109       maternal   Cancer Cousin        m cousin breat cancer s/p removal both breasts     Allergies  Allergen Reactions   Bee Venom Anaphylaxis, Itching and Swelling    Affected area Affected area Affected area Affected area    Fish Allergy Hives, Swelling and Rash    Facial swelling  Facial swelling  Facial swelling Facial swelling  Facial swelling    Latex Anaphylaxis, Rash and Shortness Of Breath    Rash  Rash     Peanut (Diagnostic) Hives   Peanut-Containing Drug Products Anaphylaxis   Prasterone Other (See Comments) and Nausea And Vomiting    rash Other reaction(s): Headache Headaches.   Shellfish Allergy Hives, Other (See Comments), Rash and Swelling    Facial swelling Uncoded Allergy. Allergen: seafood Uncoded Allergy. Allergen: CATS,  Other Reaction: itch, wheezing Uncoded Allergy. Allergen: COMPAZINE, Other Reaction: tremors Facial swelling Facial swelling Uncoded Allergy. Allergen: seafood Uncoded Allergy. Allergen: CATS, Other Reaction: itch, wheezing Uncoded Allergy. Allergen: COMPAZINE, Other Reaction: tremors Facial swelling Uncoded Allergy. Allergen: seafood Uncoded Allergy. Allergen: CATS, Other Reaction: itch, wheezing Uncoded Allergy. Allergen: COMPAZINE, Other Reaction: tremors Facial swelling Facial swelling Uncoded Allergy. Allergen: seafood Uncoded Allergy. Allergen: CATS, Other Reaction: itch,  wheezing Uncoded Allergy. Allergen: COMPAZINE, Other Reaction: tremors Facial swelling Facial swelling Uncoded Allergy. Allergen: seafood Uncoded Allergy. Allergen: CATS, Other Reaction: itch, wheezing Uncoded Allergy. Allergen: COMPAZINE, Other Reaction: tremors    Shellfish-Derived Products Hives, Other (See Comments), Rash and Swelling    Facial swelling Uncoded Allergy. Allergen: seafood Uncoded Allergy. Allergen: CATS, Other Reaction: itch, wheezing Uncoded Allergy. Allergen: COMPAZINE, Other Reaction: tremors Facial swelling Facial swelling Uncoded Allergy. Allergen: seafood Uncoded Allergy. Allergen: CATS, Other Reaction: itch, wheezing Uncoded Allergy. Allergen: COMPAZINE, Other Reaction: tremors Facial swelling Uncoded Allergy. Allergen: seafood Uncoded Allergy. Allergen: CATS, Other Reaction: itch, wheezing Uncoded Allergy. Allergen: COMPAZINE, Other Reaction: tremors Facial swelling   Compazine [Prochlorperazine Edisylate] Other (See Comments)    tremors   Dhea [Nutritional Supplements] Other (See Comments)    Headaches.   Dilaudid  [Hydromorphone  Hcl]     ? reaction   Famotidine  Other (See Comments) and Diarrhea    Other reaction(s): Headache Gas   Gabapentin  Other (See Comments)    'snowed out'    Lactose    Lactose Intolerance (Gi)    Other Other (See Comments)    Other reaction(s): Unknown    Prochlorperazine     Other reaction(s): Other (See Comments) ticks   Promethazine     Other reaction(s): Other (See Comments) Ticks   Promethazine Hcl Other (See Comments)    CNS disorder   Reclast  [Zoledronic  Acid]     Weakness could not move limbs, fatigue, increase sleep   Clarithromycin Hives and Rash   Clotrimazole Swelling and Rash   Hydromorphone  Hcl Rash and Hives    "Rash all over"   Penicillins Rash and Other (See Comments)    Has patient had a PCN reaction causing immediate rash, facial/tongue/throat swelling, SOB or lightheadedness with  hypotension:No Has patient had a PCN reaction causing severe rash involving mucus membranes or skin necrosis:No Has patient had a PCN reaction that required hospitalization:No Has patient had a PCN reaction occurring within the last 10 years:No If all of the above answers are "NO", then may proceed with Cephalosporin use.       Latest Ref Rng & Units 02/20/2024    2:30 PM 01/05/2024    1:23 PM 01/05/2024    1:54 AM  CBC  WBC 4.0 - 10.5 K/uL 5.9  5.3  5.2   Hemoglobin 12.0 - 15.0 g/dL 11.9  14.7  82.9   Hematocrit 36.0 - 46.0 % 42.2  43.7  43.4   Platelets 150 - 400 K/uL 167  193  170        CMP     Component Value Date/Time   NA 141 02/20/2024 1430   NA 141 09/02/2023 1443   NA 141 09/25/2014 0015   K 4.2 02/20/2024 1430   K 4.2 09/25/2014 0015   CL 107 02/20/2024 1430   CL 110 (H) 09/25/2014 0015   CO2 28 02/20/2024 1430   CO2 24 09/25/2014 0015   GLUCOSE 89 02/20/2024 1430   GLUCOSE 121 (H) 09/25/2014 0015   BUN 8 02/20/2024 1430   BUN 13 09/02/2023 1443  BUN 21 (H) 09/25/2014 0015   CREATININE 0.94 02/20/2024 1430   CREATININE 0.93 09/25/2014 0015   CALCIUM 9.1 02/20/2024 1430   CALCIUM 8.4 (L) 09/25/2014 0015   PROT 6.1 (L) 02/20/2024 1430   PROT 7.1 09/02/2023 1443   PROT 6.6 09/25/2014 0015   ALBUMIN 3.8 02/20/2024 1430   ALBUMIN 4.9 09/02/2023 1443   ALBUMIN 3.9 09/25/2014 0015   AST 18 02/20/2024 1430   AST 26 09/25/2014 0015   ALT 13 02/20/2024 1430   ALT 27 09/25/2014 0015   ALKPHOS 59 02/20/2024 1430   ALKPHOS 55 09/25/2014 0015   BILITOT 0.6 02/20/2024 1430   BILITOT <0.2 09/02/2023 1443   BILITOT 0.4 09/25/2014 0015   GFR 74.47 03/23/2023 1211   EGFR 76 09/02/2023 1443   GFRNONAA >60 02/20/2024 1430   GFRNONAA >60 09/25/2014 0015   GFRNONAA >60 07/03/2014 0122     No results found.     Assessment & Plan:   1. Generalized abdominal pain (Primary) Based upon the patient's description of abdominal pain that happens after eating as well as  the development of weight loss and food phobia I think it would be prudent to evaluate the patient for possible mesenteric ischemia.  She will return at her convenience for mesenteric duplex.  2. Dizziness The patient has some dizziness symptoms.  When the patient returns we will also evaluate carotid artery stenosis for possible evidence of significant carotid disease.  3. Vasculitis (HCC) The patient does have evidence of lupus and vasculitis can be associated with other autoimmune diseases.  Typically this is best worked up by vascular medicine or a rheumatologist.  The patient currently has an upcoming visit with the rheumatologist and will follow-up for further workup and treatment of vasculitis.   Current Outpatient Medications on File Prior to Visit  Medication Sig Dispense Refill   albuterol  (PROVENTIL ) (2.5 MG/3ML) 0.083% nebulizer solution Take 3 mLs (2.5 mg total) by nebulization every 6 (six) hours as needed for wheezing or shortness of breath. 150 mL 12   albuterol  (VENTOLIN  HFA) 108 (90 Base) MCG/ACT inhaler INHALE 1 TO 2 PUFFS INTO THE LUNGS EVERY 4 HOURS AS NEEDED FOR WHEEZING OR SHORTNESS OF BREATH 18 g 11   bismuth subsalicylate (PEPTO BISMOL) 262 MG chewable tablet Chew 524 mg by mouth as needed.     calcium carbonate (TUMS EX) 750 MG chewable tablet Chew 1 tablet by mouth 3 (three) times daily as needed for heartburn.     carbidopa-levodopa (SINEMET CR) 50-200 MG tablet Take 1 tablet by mouth 3 (three) times daily.     colestipol  (COLESTID ) 1 g tablet Take 3 tablets (3 g total) by mouth 2 (two) times daily. (Patient taking differently: Take 4 g by mouth 2 (two) times daily.) 180 tablet 1   dexlansoprazole  (DEXILANT ) 60 MG capsule Take 1 capsule (60 mg total) by mouth daily. 90 capsule 3   diazepam  (VALIUM ) 5 MG tablet Take 2.5 mg by mouth 2 (two) times daily as needed for anxiety.     diclofenac  sodium (VOLTAREN ) 1 % GEL Apply 2 g topically 4 (four) times daily as needed (for  knee & finger pain.).      dicyclomine  (BENTYL ) 20 MG tablet Take 1 tablet (20 mg total) by mouth every 6 (six) hours. (Patient taking differently: Take 20 mg by mouth every 6 (six) hours. Pt is taking 3 times a daily) 120 tablet 2   DULoxetine  (CYMBALTA ) 60 MG capsule Take 1 capsule (60 mg total)  by mouth 2 (two) times daily. 120 capsule 1   EPINEPHrine  0.3 mg/0.3 mL IJ SOAJ injection Inject 0.3 mg into the muscle as needed for anaphylaxis. 1 each 2   fluticasone -salmeterol (ADVAIR DISKUS) 250-50 MCG/ACT AEPB Inhale 1 puff into the lungs in the morning and at bedtime. Rinse mouth after use 60 each 11   HYDROcodone -acetaminophen  (NORCO/VICODIN) 5-325 MG tablet Take 1 tablet by mouth 2 (two) times daily as needed. Take 2x"s daily     hydroxychloroquine  (PLAQUENIL ) 200 MG tablet Plaquenil  200 mg tablet  Take 1 tablet twice a day by oral route after meals for 90 days.     lactase (LACTAID) 3000 units tablet Take 3,000 Units by mouth 3 (three) times daily with meals.     latanoprost (XALATAN) 0.005 % ophthalmic solution 1 drop at bedtime.     levothyroxine  (SYNTHROID ) 75 MCG tablet TAKE 1 TABLET BY MOUTH DAILY 30 MINUTES BEFORE BREAKFAST 90 tablet 3   LORazepam  (ATIVAN ) 2 MG tablet Take 1 mg by mouth at bedtime.     meclizine  (ANTIVERT ) 12.5 MG tablet Take 12.5 mg by mouth 2 (two) times daily.     montelukast  (SINGULAIR ) 10 MG tablet TAKE 1 TABLET BY MOUTH EVERY NIGHT AT BEDTIME 90 tablet 3   nitrofurantoin  (MACRODANTIN ) 100 MG capsule Take 1 capsule (100 mg total) by mouth daily. 30 capsule 11   ondansetron  (ZOFRAN -ODT) 8 MG disintegrating tablet DISSOLVE 1 TABLET ON THE TONGUE EVERY 8 HOURS AS NEEDED FOR NAUSEA OR VOMITING 90 tablet 2   PAXIL  20 MG tablet Take 20 mg by mouth daily.     topiramate  (TOPAMAX ) 50 MG tablet Take 3 tablets (150 mg total) by mouth 2 (two) times daily. 180 tablet 2   Zoledronic  Acid (RECLAST  IV)      No current facility-administered medications on file prior to visit.     There are no Patient Instructions on file for this visit. No follow-ups on file.   Lenea Bywater E Deija Buhrman, NP

## 2024-04-17 ENCOUNTER — Other Ambulatory Visit: Admitting: Urology

## 2024-04-17 DIAGNOSIS — Z1721 Progesterone receptor positive status: Secondary | ICD-10-CM | POA: Diagnosis not present

## 2024-04-17 DIAGNOSIS — C50811 Malignant neoplasm of overlapping sites of right female breast: Secondary | ICD-10-CM | POA: Diagnosis not present

## 2024-04-17 DIAGNOSIS — C50311 Malignant neoplasm of lower-inner quadrant of right female breast: Secondary | ICD-10-CM | POA: Diagnosis not present

## 2024-04-17 DIAGNOSIS — Z1732 Human epidermal growth factor receptor 2 negative status: Secondary | ICD-10-CM | POA: Diagnosis not present

## 2024-04-17 DIAGNOSIS — Z170 Estrogen receptor positive status [ER+]: Secondary | ICD-10-CM | POA: Diagnosis not present

## 2024-04-17 DIAGNOSIS — C50511 Malignant neoplasm of lower-outer quadrant of right female breast: Secondary | ICD-10-CM | POA: Diagnosis not present

## 2024-04-18 ENCOUNTER — Other Ambulatory Visit (INDEPENDENT_AMBULATORY_CARE_PROVIDER_SITE_OTHER): Payer: Self-pay | Admitting: Nurse Practitioner

## 2024-04-18 ENCOUNTER — Encounter (INDEPENDENT_AMBULATORY_CARE_PROVIDER_SITE_OTHER): Payer: Self-pay

## 2024-04-18 DIAGNOSIS — R42 Dizziness and giddiness: Secondary | ICD-10-CM

## 2024-04-18 DIAGNOSIS — R4589 Other symptoms and signs involving emotional state: Secondary | ICD-10-CM | POA: Diagnosis not present

## 2024-04-18 DIAGNOSIS — Z170 Estrogen receptor positive status [ER+]: Secondary | ICD-10-CM | POA: Diagnosis not present

## 2024-04-18 DIAGNOSIS — I890 Lymphedema, not elsewhere classified: Secondary | ICD-10-CM | POA: Diagnosis not present

## 2024-04-18 DIAGNOSIS — C50311 Malignant neoplasm of lower-inner quadrant of right female breast: Secondary | ICD-10-CM | POA: Diagnosis not present

## 2024-04-18 DIAGNOSIS — M8580 Other specified disorders of bone density and structure, unspecified site: Secondary | ICD-10-CM | POA: Diagnosis not present

## 2024-04-18 DIAGNOSIS — G8929 Other chronic pain: Secondary | ICD-10-CM | POA: Diagnosis not present

## 2024-04-18 DIAGNOSIS — R1013 Epigastric pain: Secondary | ICD-10-CM | POA: Diagnosis not present

## 2024-04-18 DIAGNOSIS — R1084 Generalized abdominal pain: Secondary | ICD-10-CM

## 2024-04-18 DIAGNOSIS — G894 Chronic pain syndrome: Secondary | ICD-10-CM | POA: Diagnosis not present

## 2024-04-19 ENCOUNTER — Ambulatory Visit: Admitting: Obstetrics

## 2024-04-19 DIAGNOSIS — G8929 Other chronic pain: Secondary | ICD-10-CM | POA: Diagnosis not present

## 2024-04-19 DIAGNOSIS — R1013 Epigastric pain: Secondary | ICD-10-CM | POA: Diagnosis not present

## 2024-04-19 DIAGNOSIS — R1033 Periumbilical pain: Secondary | ICD-10-CM | POA: Diagnosis not present

## 2024-04-19 DIAGNOSIS — Z9049 Acquired absence of other specified parts of digestive tract: Secondary | ICD-10-CM | POA: Diagnosis not present

## 2024-04-19 DIAGNOSIS — C50311 Malignant neoplasm of lower-inner quadrant of right female breast: Secondary | ICD-10-CM | POA: Diagnosis not present

## 2024-04-19 DIAGNOSIS — G20A1 Parkinson's disease without dyskinesia, without mention of fluctuations: Secondary | ICD-10-CM | POA: Diagnosis not present

## 2024-04-19 DIAGNOSIS — Z170 Estrogen receptor positive status [ER+]: Secondary | ICD-10-CM | POA: Diagnosis not present

## 2024-04-19 DIAGNOSIS — L905 Scar conditions and fibrosis of skin: Secondary | ICD-10-CM | POA: Diagnosis not present

## 2024-04-19 DIAGNOSIS — M329 Systemic lupus erythematosus, unspecified: Secondary | ICD-10-CM | POA: Diagnosis not present

## 2024-04-19 DIAGNOSIS — Z1732 Human epidermal growth factor receptor 2 negative status: Secondary | ICD-10-CM | POA: Diagnosis not present

## 2024-04-19 DIAGNOSIS — J45909 Unspecified asthma, uncomplicated: Secondary | ICD-10-CM | POA: Diagnosis not present

## 2024-04-19 DIAGNOSIS — E039 Hypothyroidism, unspecified: Secondary | ICD-10-CM | POA: Diagnosis not present

## 2024-04-19 DIAGNOSIS — Z1721 Progesterone receptor positive status: Secondary | ICD-10-CM | POA: Diagnosis not present

## 2024-04-20 ENCOUNTER — Ambulatory Visit (INDEPENDENT_AMBULATORY_CARE_PROVIDER_SITE_OTHER): Admitting: Vascular Surgery

## 2024-04-20 ENCOUNTER — Ambulatory Visit (INDEPENDENT_AMBULATORY_CARE_PROVIDER_SITE_OTHER)

## 2024-04-20 VITALS — BP 132/76 | HR 69 | Resp 17 | Ht 67.0 in | Wt 164.2 lb

## 2024-04-20 DIAGNOSIS — J4540 Moderate persistent asthma, uncomplicated: Secondary | ICD-10-CM

## 2024-04-20 DIAGNOSIS — R55 Syncope and collapse: Secondary | ICD-10-CM | POA: Diagnosis not present

## 2024-04-20 DIAGNOSIS — R109 Unspecified abdominal pain: Secondary | ICD-10-CM | POA: Diagnosis not present

## 2024-04-20 DIAGNOSIS — I6523 Occlusion and stenosis of bilateral carotid arteries: Secondary | ICD-10-CM

## 2024-04-20 DIAGNOSIS — R42 Dizziness and giddiness: Secondary | ICD-10-CM | POA: Diagnosis not present

## 2024-04-20 DIAGNOSIS — K219 Gastro-esophageal reflux disease without esophagitis: Secondary | ICD-10-CM

## 2024-04-20 DIAGNOSIS — R1084 Generalized abdominal pain: Secondary | ICD-10-CM

## 2024-04-20 DIAGNOSIS — M329 Systemic lupus erythematosus, unspecified: Secondary | ICD-10-CM | POA: Diagnosis not present

## 2024-04-21 DIAGNOSIS — Z170 Estrogen receptor positive status [ER+]: Secondary | ICD-10-CM | POA: Diagnosis not present

## 2024-04-21 DIAGNOSIS — C50311 Malignant neoplasm of lower-inner quadrant of right female breast: Secondary | ICD-10-CM | POA: Diagnosis not present

## 2024-04-22 DIAGNOSIS — R4589 Other symptoms and signs involving emotional state: Secondary | ICD-10-CM | POA: Insufficient documentation

## 2024-04-23 DIAGNOSIS — Z6824 Body mass index (BMI) 24.0-24.9, adult: Secondary | ICD-10-CM | POA: Diagnosis not present

## 2024-04-23 DIAGNOSIS — E039 Hypothyroidism, unspecified: Secondary | ICD-10-CM | POA: Diagnosis not present

## 2024-04-23 DIAGNOSIS — R1013 Epigastric pain: Secondary | ICD-10-CM | POA: Diagnosis not present

## 2024-04-23 DIAGNOSIS — R001 Bradycardia, unspecified: Secondary | ICD-10-CM | POA: Diagnosis not present

## 2024-04-23 DIAGNOSIS — Z7951 Long term (current) use of inhaled steroids: Secondary | ICD-10-CM | POA: Diagnosis not present

## 2024-04-23 DIAGNOSIS — G894 Chronic pain syndrome: Secondary | ICD-10-CM | POA: Diagnosis not present

## 2024-04-23 DIAGNOSIS — Z1732 Human epidermal growth factor receptor 2 negative status: Secondary | ICD-10-CM | POA: Diagnosis not present

## 2024-04-23 DIAGNOSIS — D6489 Other specified anemias: Secondary | ICD-10-CM | POA: Diagnosis not present

## 2024-04-23 DIAGNOSIS — Z79899 Other long term (current) drug therapy: Secondary | ICD-10-CM | POA: Diagnosis not present

## 2024-04-23 DIAGNOSIS — K922 Gastrointestinal hemorrhage, unspecified: Secondary | ICD-10-CM | POA: Diagnosis not present

## 2024-04-23 DIAGNOSIS — Z170 Estrogen receptor positive status [ER+]: Secondary | ICD-10-CM | POA: Diagnosis not present

## 2024-04-23 DIAGNOSIS — R112 Nausea with vomiting, unspecified: Secondary | ICD-10-CM | POA: Diagnosis not present

## 2024-04-23 DIAGNOSIS — Z1722 Progesterone receptor negative status: Secondary | ICD-10-CM | POA: Diagnosis not present

## 2024-04-23 DIAGNOSIS — R634 Abnormal weight loss: Secondary | ICD-10-CM | POA: Diagnosis not present

## 2024-04-23 DIAGNOSIS — K921 Melena: Secondary | ICD-10-CM | POA: Diagnosis not present

## 2024-04-23 DIAGNOSIS — J45909 Unspecified asthma, uncomplicated: Secondary | ICD-10-CM | POA: Diagnosis not present

## 2024-04-23 DIAGNOSIS — Z79890 Hormone replacement therapy (postmenopausal): Secondary | ICD-10-CM | POA: Diagnosis not present

## 2024-04-23 DIAGNOSIS — M329 Systemic lupus erythematosus, unspecified: Secondary | ICD-10-CM | POA: Diagnosis not present

## 2024-04-23 DIAGNOSIS — M359 Systemic involvement of connective tissue, unspecified: Secondary | ICD-10-CM | POA: Diagnosis not present

## 2024-04-23 DIAGNOSIS — G20A1 Parkinson's disease without dyskinesia, without mention of fluctuations: Secondary | ICD-10-CM | POA: Diagnosis not present

## 2024-04-23 DIAGNOSIS — R42 Dizziness and giddiness: Secondary | ICD-10-CM | POA: Diagnosis not present

## 2024-04-23 DIAGNOSIS — C50311 Malignant neoplasm of lower-inner quadrant of right female breast: Secondary | ICD-10-CM | POA: Diagnosis not present

## 2024-04-24 ENCOUNTER — Encounter (INDEPENDENT_AMBULATORY_CARE_PROVIDER_SITE_OTHER): Payer: Self-pay | Admitting: Vascular Surgery

## 2024-04-24 DIAGNOSIS — R1084 Generalized abdominal pain: Secondary | ICD-10-CM | POA: Diagnosis not present

## 2024-04-24 DIAGNOSIS — R109 Unspecified abdominal pain: Secondary | ICD-10-CM | POA: Insufficient documentation

## 2024-04-24 DIAGNOSIS — R197 Diarrhea, unspecified: Secondary | ICD-10-CM | POA: Diagnosis not present

## 2024-04-24 DIAGNOSIS — I6529 Occlusion and stenosis of unspecified carotid artery: Secondary | ICD-10-CM | POA: Insufficient documentation

## 2024-04-24 DIAGNOSIS — R55 Syncope and collapse: Secondary | ICD-10-CM | POA: Insufficient documentation

## 2024-04-24 DIAGNOSIS — R42 Dizziness and giddiness: Secondary | ICD-10-CM | POA: Diagnosis not present

## 2024-04-24 NOTE — Progress Notes (Signed)
 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  History of Present Illness:   The patient presents complaining of 2 problems 1 is diffuse abdominal pain associated with diarrhea.  Because of this the possibility of mesenteric ischemia was raised.  She did undergo a CT abdomen pelvis with contrast March 15, 2024 this was at [facility].  The report was reviewed with the patient and did not show any evidence for stricture or stenosis of the visceral arteries.  Today, a duplex ultrasound of the visceral arteries is obtained.  There is no evidence of stenosis of the celiac or SMA at at their origin.  Also the IMA appears to be patent.  As a secondary issue the patient is describing numerous episodes of dizziness which actually causes her to pass out or to collapse.  There does not appear to be any warning.  No inciting event that is predictable.  Again concern is been raised regarding potential vascular etiology.  Duplex ultrasound of the carotid arteries obtained today demonstrates 40-59 percent bilateral internal carotid artery stenosis.  The subclavian on the right appears normal.  The subclavian on the left appears to have elevated velocities of 351 cm/s with a multiphasic signal.  The vertebral arteries are reported as antegrade bilaterally.  Current Meds  Medication Sig   albuterol  (PROVENTIL ) (2.5 MG/3ML) 0.083% nebulizer solution Take 3 mLs (2.5 mg total) by nebulization every 6 (six) hours as needed for wheezing or shortness of breath.   albuterol  (VENTOLIN  HFA) 108 (90 Base) MCG/ACT inhaler INHALE 1 TO 2 PUFFS INTO THE LUNGS EVERY 4 HOURS AS NEEDED FOR WHEEZING OR SHORTNESS OF BREATH   bismuth subsalicylate (PEPTO BISMOL) 262 MG chewable tablet Chew 524 mg by mouth as needed.   botulinum toxin Type A (BOTOX) 200 units injection  Inject 300 Units into the muscle.   calcium carbonate (TUMS EX) 750 MG chewable tablet Chew 1 tablet by mouth 3 (three) times daily as needed for heartburn.   carbidopa-levodopa (SINEMET CR) 50-200 MG tablet Take 1 tablet by mouth 3 (three) times daily.   colestipol  (COLESTID ) 1 g tablet Take 3 tablets (3 g total) by mouth 2 (two) times daily. (Patient taking differently: Take 4 g by mouth 2 (two) times daily.)   dexlansoprazole  (DEXILANT ) 60 MG capsule Take 1 capsule (60 mg total) by mouth daily.   diclofenac  sodium (VOLTAREN ) 1 % GEL Apply 2 g topically 4 (four) times daily as needed (for knee & finger pain.).    dicyclomine  (BENTYL ) 20 MG tablet Take 1 tablet (20 mg total) by mouth every 6 (six) hours. (Patient taking differently: Take 20 mg by mouth every 6 (six) hours. Pt is taking 3 times a daily)   DULoxetine  (CYMBALTA ) 60 MG capsule Take 1 capsule (60 mg total) by mouth 2 (two) times daily.   EPINEPHrine  0.3 mg/0.3 mL IJ SOAJ injection Inject 0.3 mg into the muscle as needed for anaphylaxis.   fluticasone -salmeterol (ADVAIR DISKUS) 250-50 MCG/ACT AEPB Inhale 1 puff into the lungs in  the morning and at bedtime. Rinse mouth after use   HYDROcodone -acetaminophen  (NORCO/VICODIN) 5-325 MG tablet Take 1 tablet by mouth 2 (two) times daily as needed. Take 2x"s daily   hydroxychloroquine  (PLAQUENIL ) 200 MG tablet Plaquenil  200 mg tablet  Take 1 tablet twice a day by oral route after meals for 90 days.   lactase (LACTAID) 3000 units tablet Take 3,000 Units by mouth 3 (three) times daily with meals.   latanoprost (XALATAN) 0.005 % ophthalmic solution 1 drop at bedtime.   levothyroxine  (SYNTHROID ) 75 MCG tablet TAKE 1 TABLET BY MOUTH DAILY 30 MINUTES BEFORE BREAKFAST   LORazepam  (ATIVAN ) 2 MG tablet Take 1 mg by mouth at bedtime.   meclizine  (ANTIVERT ) 12.5 MG tablet Take 12.5 mg by mouth 2 (two) times daily.   montelukast  (SINGULAIR ) 10 MG tablet TAKE 1 TABLET BY MOUTH EVERY NIGHT AT BEDTIME    ondansetron  (ZOFRAN -ODT) 8 MG disintegrating tablet DISSOLVE 1 TABLET ON THE TONGUE EVERY 8 HOURS AS NEEDED FOR NAUSEA OR VOMITING   PAXIL  20 MG tablet Take 20 mg by mouth daily.   topiramate  (TOPAMAX ) 50 MG tablet Take 3 tablets (150 mg total) by mouth 2 (two) times daily.   Zoledronic  Acid (RECLAST  IV)     Past Medical History:  Diagnosis Date   ADHD    Allergy 12/30/2018   Seasonal   Anemia    ANXIETY 06/06/2007   Arthritis    related to lupus   Asthma    BACK PAIN 02/22/2009   Breast cancer (HCC)    Cervical cancer (HCC)    LEEP 2005    Chicken pox    Chronic pain    goes to pain management provider   Collagen vascular disease (HCC)    Connective tissue disease (HCC)    DEPRESSION 06/06/2007   Essential tremor    Fibromyalgia    Fibromyalgia    FREQUENCY, URINARY 07/07/2007   Gastritis    GERD 06/06/2007   Glaucoma    Headache    h/o migraines    History of hiatal hernia    History of kidney stones    HYPOTHYROIDISM 06/06/2007   INTERSTITIAL CYSTITIS 09/11/2010   Lupus 2006   Lupus    Menopause    Migraine    Mitral valve regurgitation    NEPHROLITHIASIS, HX OF 11/05/2010   Neuropathy    Neuropathy    OCD (obsessive compulsive disorder)    Optic neuritis    2005   OSTEOPENIA 10/14/2009   Osteoporosis    OVERACTIVE BLADDER 10/14/2009   PAIN, CHRONIC NEC 06/06/2007   Panniculitis    Parkinson disease (HCC) 04/03/2019   Parkinson disease (HCC)    POLYARTHRITIS 08/19/2007   Raynaud's disease    Sicca syndrome (HCC)    Ulcer 11/30/2017   Don't actually know date   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 Denman Fischer [facility]    Vasculitis (HCC)    WEIGHT GAIN 02/22/2009    Past Surgical History:  Procedure Laterality Date   ADENOIDECTOMY     APPENDECTOMY     CERVICAL BIOPSY  W/ LOOP ELECTRODE EXCISION  2005   CHOLECYSTECTOMY     COLONOSCOPY N/A 10/16/2022   Procedure: COLONOSCOPY;  Surgeon: Shane Darling, MD;   Location: [facility] ENDOSCOPY;  Service: Gastroenterology;  Laterality: N/A;   ESOPHAGOGASTRODUODENOSCOPY N/A 10/16/2022   Procedure: ESOPHAGOGASTRODUODENOSCOPY (EGD);  Surgeon: Shane Darling, MD;  Location: [facility] ENDOSCOPY;  Service: Gastroenterology;  Laterality: N/A;   ESOPHAGOGASTRODUODENOSCOPY (  EGD) WITH PROPOFOL  N/A 08/27/2017   Procedure: ESOPHAGOGASTRODUODENOSCOPY (EGD) WITH PROPOFOL ;  Surgeon: Cassie Click, MD;  Location: [facility] ENDOSCOPY;  Service: Endoscopy;  Laterality: N/A;   FRACTURE SURGERY  11/30/2008   HAND SURGERY     repair of left metatarsal    HARDWARE REMOVAL Right 02/16/2017   Procedure: HARDWARE REMOVAL FROM HIP;  Surgeon: Molli Angelucci, MD;  Location: [facility] ORS;  Service: Orthopedics;  Laterality: Right;   JOINT REPLACEMENT     OTHER SURGICAL HISTORY     left 3rd metatarsal fracture repair 2011    REVISION TOTAL HIP ARTHROPLASTY  06/2009   replacement 2010 then screw and plate removal 1610; right hip   TONSILLECTOMY     TONSILLECTOMY     wrist  ligament repair bilateral     WRIST RECONSTRUCTION     WRIST SURGERY     laceration of right wrist ligaments   wrist surgery     laceration of left wrist surgery     Social History Social History   Tobacco Use   Smoking status: Never   Smokeless tobacco: Never  Vaping Use   Vaping status: Never Used  Substance Use Topics   Alcohol  use: No   Drug use: No    Family History Family History  Problem Relation Age of Onset   Hypertension Mother    Arthritis Mother    Heart disease Mother        afib   Asthma Mother    Asthma Sister    Asthma Daughter    Arthritis Daughter    Heart disease Daughter        ?heart condition on BB   ADD / ADHD Daughter    Depression Daughter    Intellectual disability Daughter    Learning disabilities Daughter    Pancreatic cancer Father    Cancer Father        pancreatitic    Diabetes Maternal Grandmother    Heart disease Maternal Grandmother    Arthritis Maternal  Grandmother    Hypertension Maternal Grandmother    Dementia Maternal Grandmother    Colon cancer Maternal Uncle 45   Crohn's disease Maternal Aunt    Breast cancer Maternal Aunt 65   Diabetes Maternal Aunt    Breast cancer Cousin 64       maternal   Cancer Cousin        m cousin breat cancer s/p removal both breasts     Allergies  Allergen Reactions   Bee Venom Anaphylaxis, Itching and Swelling    Affected area Affected area Affected area Affected area    Fish Allergy Hives, Swelling and Rash    Facial swelling  Facial swelling  Facial swelling Facial swelling  Facial swelling    Latex Anaphylaxis, Rash and Shortness Of Breath    Rash  Rash     Peanut (Diagnostic) Hives   Peanut-Containing Drug Products Anaphylaxis   Prasterone Other (See Comments) and Nausea And Vomiting    rash Other reaction(s): Headache Headaches.   Shellfish Allergy Hives, Other (See Comments), Rash and Swelling    Facial swelling Uncoded Allergy. Allergen: seafood Uncoded Allergy. Allergen: CATS, Other Reaction: itch, wheezing Uncoded Allergy. Allergen: COMPAZINE, Other Reaction: tremors Facial swelling Facial swelling Uncoded Allergy. Allergen: seafood Uncoded Allergy. Allergen: CATS, Other Reaction: itch, wheezing Uncoded Allergy. Allergen: COMPAZINE, Other Reaction: tremors Facial swelling Uncoded Allergy. Allergen: seafood Uncoded Allergy. Allergen: CATS, Other Reaction: itch, wheezing Uncoded Allergy. Allergen: COMPAZINE, Other Reaction: tremors Facial swelling Facial  swelling Uncoded Allergy. Allergen: seafood Uncoded Allergy. Allergen: CATS, Other Reaction: itch, wheezing Uncoded Allergy. Allergen: COMPAZINE, Other Reaction: tremors Facial swelling Facial swelling Uncoded Allergy. Allergen: seafood Uncoded Allergy. Allergen: CATS, Other Reaction: itch, wheezing Uncoded Allergy. Allergen: COMPAZINE, Other Reaction: tremors    Shellfish-Derived Products Hives, Other (See  Comments), Rash and Swelling    Facial swelling Uncoded Allergy. Allergen: seafood Uncoded Allergy. Allergen: CATS, Other Reaction: itch, wheezing Uncoded Allergy. Allergen: COMPAZINE, Other Reaction: tremors Facial swelling Facial swelling Uncoded Allergy. Allergen: seafood Uncoded Allergy. Allergen: CATS, Other Reaction: itch, wheezing Uncoded Allergy. Allergen: COMPAZINE, Other Reaction: tremors Facial swelling Uncoded Allergy. Allergen: seafood Uncoded Allergy. Allergen: CATS, Other Reaction: itch, wheezing Uncoded Allergy. Allergen: COMPAZINE, Other Reaction: tremors Facial swelling   Compazine [Prochlorperazine Edisylate] Other (See Comments)    tremors   Dhea [Nutritional Supplements] Other (See Comments)    Headaches.   Dilaudid  [Hydromorphone  Hcl]     ? reaction   Famotidine  Other (See Comments) and Diarrhea    Other reaction(s): Headache Gas   Gabapentin  Other (See Comments)    'snowed out'    Lactose    Lactose Intolerance (Gi)    Other Other (See Comments)    Other reaction(s): Unknown    Prochlorperazine     Other reaction(s): Other (See Comments) ticks   Promethazine     Other reaction(s): Other (See Comments) Ticks   Promethazine Hcl Other (See Comments)    CNS disorder   Reclast  [Zoledronic  Acid]     Weakness could not move limbs, fatigue, increase sleep   Clarithromycin Hives and Rash   Clotrimazole Swelling and Rash   Hydromorphone  Hcl Rash and Hives    "Rash all over"   Penicillins Rash and Other (See Comments)    Has patient had a PCN reaction causing immediate rash, facial/tongue/throat swelling, SOB or lightheadedness with hypotension:No Has patient had a PCN reaction causing severe rash involving mucus membranes or skin necrosis:No Has patient had a PCN reaction that required hospitalization:No Has patient had a PCN reaction occurring within the last 10 years:No If all of the above answers are "NO", then may proceed with Cephalosporin use.      REVIEW OF SYSTEMS (Negative unless checked)  Constitutional: [] Weight loss  [] Fever  [] Chills Cardiac: [] Chest pain   [] Chest pressure   [] Palpitations   [] Shortness of breath when laying flat   [] Shortness of breath with exertion. Vascular:  [x] Pain in legs with walking   [] Pain in legs at rest  [] History of DVT   [] Phlebitis   [] Swelling in legs   [] Varicose veins   [] Non-healing ulcers Pulmonary:   [] Uses home oxygen   [] Productive cough   [] Hemoptysis   [] Wheeze  [] COPD   [x] Asthma Neurologic:  [x] Dizziness   [] Seizures   [] History of stroke   [] History of TIA  [] Aphasia   [] Vissual changes   [] Weakness or numbness in arm   [] Weakness or numbness in leg Musculoskeletal:   [] Joint swelling   [x] Joint pain   [x] Low back pain Hematologic:  [] Easy bruising  [] Easy bleeding   [] Hypercoagulable state   [] Anemic Gastrointestinal:  [] Diarrhea   [] Vomiting  [x] Gastroesophageal reflux/heartburn   [] Difficulty swallowing. Genitourinary:  [] Chronic kidney disease   [] Difficult urination  [] Frequent urination   [] Blood in urine Skin:  [] Rashes   [] Ulcers  Psychological:  [] History of anxiety   []  History of major depression.  Physical Examination  Vitals:   04/20/24 1020  BP: 132/76  Pulse: 69  Resp: 17  Weight:  164 lb 3.2 oz (74.5 kg)  Height: 5\' 7"  (1.702 m)   Body mass index is 25.72 kg/m. Gen: WD/WN, NAD Head: [place]/AT, No temporalis wasting.  Ear/Nose/Throat: Hearing grossly intact, nares w/o erythema or drainage Eyes: PER, EOMI, sclera nonicteric.  Neck: Supple, no masses.  No bruit or JVD.  Pulmonary:  Good air movement, no audible wheezing, no use of accessory muscles.  Cardiac: RRR, normal S1, S2, no Murmurs. Vascular:  mild trophic changes, no open wounds Vessel Right Left  Radial Palpable Palpable  Carotid  Palpable Palpable  Gastrointestinal: soft, non-distended. No guarding/no peritoneal signs.  Musculoskeletal: M/S 5/5 throughout.  No visible deformity.  Neurologic:  CN 2-12 intact. Pain and light touch intact in extremities.  Symmetrical.  Speech is fluent. Motor exam as listed above. Psychiatric: Judgment intact, Mood & affect appropriate for pt's clinical situation. Dermatologic: No rashes or ulcers noted.  No changes consistent with cellulitis.   CBC Lab Results  Component Value Date   WBC 5.9 02/20/2024   HGB 14.0 02/20/2024   HCT 42.2 02/20/2024   MCV 84.9 02/20/2024   PLT 167 02/20/2024    BMET    Component Value Date/Time   NA 141 02/20/2024 1430   NA 141 09/02/2023 1443   NA 141 09/25/2014 0015   K 4.2 02/20/2024 1430   K 4.2 09/25/2014 0015   CL 107 02/20/2024 1430   CL 110 (H) 09/25/2014 0015   CO2 28 02/20/2024 1430   CO2 24 09/25/2014 0015   GLUCOSE 89 02/20/2024 1430   GLUCOSE 121 (H) 09/25/2014 0015   BUN 8 02/20/2024 1430   BUN 13 09/02/2023 1443   BUN 21 (H) 09/25/2014 0015   CREATININE 0.94 02/20/2024 1430   CREATININE 0.93 09/25/2014 0015   CALCIUM 9.1 02/20/2024 1430   CALCIUM 8.4 (L) 09/25/2014 0015   GFRNONAA >60 02/20/2024 1430   GFRNONAA >60 09/25/2014 0015   GFRNONAA >60 07/03/2014 0122   GFRAA >60 04/17/2020 0724   GFRAA >60 09/25/2014 0015   GFRAA >60 07/03/2014 0122   CrCl cannot be calculated (Patient's most recent lab result is older than the maximum 21 days allowed.).  COAG Lab Results  Component Value Date   INR 0.98 10/11/2017   INR 1.7 (H) 07/08/2009   INR 1.8 (H) 07/07/2009    Radiology No results found.   Assessment/Plan: 1. Abdominal pain, unspecified abdominal location (Primary) Based on the review of the CT scan from April 2025 as well as the duplex ultrasound obtained today there does not appear to be vascular etiology for her abdominal pain.  No further workup regarding mesenteric ischemia is indicated at this time.  2. Syncope and collapse Based on the patient's history and the potential left subclavian stenosis I will order a CT a of the neck.  In addition given that this  also could have an intracranial component the CT angio of the head will be obtained as well.  Risks and benefits for contrast exposure and CT angiography was reviewed all questions were answered patient has agreed to proceed.  - CT ANGIO HEAD W OR WO CONTRAST; Future - CT ANGIO NECK W OR WO CONTRAST; Future  3. Bilateral carotid artery stenosis Based on the patient's history and the potential left subclavian stenosis I will order a CT a of the neck.  In addition given that this also could have an intracranial component the CT angio of the head will be obtained as well.  Risks and benefits for contrast exposure  and CT angiography was reviewed all questions were answered patient has agreed to proceed.  - CT ANGIO HEAD W OR WO CONTRAST; Future - CT ANGIO NECK W OR WO CONTRAST; Future  4. Moderate persistent asthma, unspecified whether complicated Continue pulmonary medications and aerosols as already ordered, these medications have been reviewed and there are no changes at this time.   5. Gastroesophageal reflux disease without esophagitis Continue PPI as already ordered, this medication has been reviewed and there are no changes at this time.  Avoidence of caffeine  and alcohol   Moderate elevation of the head of the bed    Devon Fogo, MD  04/24/2024 10:54 AM

## 2024-04-25 DIAGNOSIS — R1084 Generalized abdominal pain: Secondary | ICD-10-CM | POA: Diagnosis not present

## 2024-04-25 DIAGNOSIS — R42 Dizziness and giddiness: Secondary | ICD-10-CM | POA: Diagnosis not present

## 2024-04-26 ENCOUNTER — Telehealth (INDEPENDENT_AMBULATORY_CARE_PROVIDER_SITE_OTHER): Payer: Self-pay | Admitting: Vascular Surgery

## 2024-04-26 NOTE — Telephone Encounter (Signed)
 LVM for pt to call radiology scheduling at [phone] and make the CT appts. I also advised to call us  at the office to schedule a CT results appt with Dr. Prescilla Brod for after the CT has been performed.

## 2024-05-01 ENCOUNTER — Ambulatory Visit: Payer: Medicare Other | Admitting: Physician Assistant

## 2024-05-08 ENCOUNTER — Encounter (INDEPENDENT_AMBULATORY_CARE_PROVIDER_SITE_OTHER): Payer: Self-pay | Admitting: Vascular Surgery

## 2024-05-08 NOTE — Telephone Encounter (Signed)
 Patient will need to be scheduled for ct follow up with Dr Prescilla Brod

## 2024-05-09 ENCOUNTER — Ambulatory Visit
Admission: RE | Admit: 2024-05-09 | Discharge: 2024-05-09 | Disposition: A | Discharge status: Home / Self Care | Source: Ambulatory Visit | Attending: Vascular Surgery | Admitting: Vascular Surgery

## 2024-05-09 DIAGNOSIS — R55 Syncope and collapse: Secondary | ICD-10-CM | POA: Insufficient documentation

## 2024-05-09 MED ORDER — IOHEXOL 350 MG/ML SOLN
75.0000 mL | Freq: Once | INTRAVENOUS | Status: AC | PRN
  Administered 2024-05-09: 75 mL via INTRAVENOUS

## 2024-05-29 ENCOUNTER — Encounter (INDEPENDENT_AMBULATORY_CARE_PROVIDER_SITE_OTHER)

## 2024-05-29 ENCOUNTER — Ambulatory Visit (INDEPENDENT_AMBULATORY_CARE_PROVIDER_SITE_OTHER): Admitting: Vascular Surgery

## 2024-07-12 DIAGNOSIS — F0631 Mood disorder due to known physiological condition with depressive features: Secondary | ICD-10-CM | POA: Insufficient documentation

## 2024-08-03 ENCOUNTER — Ambulatory Visit (INDEPENDENT_AMBULATORY_CARE_PROVIDER_SITE_OTHER): Admitting: Vascular Surgery

## 2024-08-23 DIAGNOSIS — Z800 Family history of malignant neoplasm of digestive organs: Secondary | ICD-10-CM | POA: Insufficient documentation

## 2024-08-30 ENCOUNTER — Telehealth (INDEPENDENT_AMBULATORY_CARE_PROVIDER_SITE_OTHER): Payer: Self-pay | Admitting: Vascular Surgery

## 2024-08-30 NOTE — Telephone Encounter (Signed)
 It doesn't matter, whatever fits and feels comfortable for her

## 2024-08-30 NOTE — Telephone Encounter (Signed)
 Spoken to patient and Fallon's notified comments.   However, patient insisted that she gets the particular one Dr Jama mention by 2021. I have told her Dr Jama is not in office to answer this, right now.  Please help.

## 2024-08-30 NOTE — Telephone Encounter (Addendum)
 Patient left a message stating that she recently saw a [facility] and was told she would benefit having compression above the knee.  Patient stated that she was told of a particular brand by in 2021 but don't remember.  She asked what is the brand she needs to get or does it really matter.

## 2024-09-01 NOTE — Telephone Encounter (Signed)
 Left a message on patient voicemail to contact office

## 2024-09-09 DIAGNOSIS — Z79811 Long term (current) use of aromatase inhibitors: Secondary | ICD-10-CM | POA: Insufficient documentation

## 2024-09-14 ENCOUNTER — Ambulatory Visit (INDEPENDENT_AMBULATORY_CARE_PROVIDER_SITE_OTHER): Admitting: Podiatry

## 2024-09-14 ENCOUNTER — Encounter: Payer: Self-pay | Admitting: Podiatry

## 2024-09-14 DIAGNOSIS — B351 Tinea unguium: Secondary | ICD-10-CM

## 2024-09-14 DIAGNOSIS — D2372 Other benign neoplasm of skin of left lower limb, including hip: Secondary | ICD-10-CM | POA: Diagnosis not present

## 2024-09-14 DIAGNOSIS — M79676 Pain in unspecified toe(s): Secondary | ICD-10-CM | POA: Diagnosis not present

## 2024-09-14 NOTE — Progress Notes (Signed)
 She presents today for follow-up of calluses plantar aspect of the bilateral.  Objective: Contracted digits and severe deformity coupled with Parkinson's disease nail breast cancer and eating disorder has resulted in severe loss of fat pad fat atrophy.  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  Plan: Debridement of benign neoplasms bilateral.

## 2024-10-02 ENCOUNTER — Ambulatory Visit (INDEPENDENT_AMBULATORY_CARE_PROVIDER_SITE_OTHER): Admitting: Vascular Surgery

## 2024-10-17 NOTE — Telephone Encounter (Signed)
 open in error

## 2024-11-06 ENCOUNTER — Ambulatory Visit (INDEPENDENT_AMBULATORY_CARE_PROVIDER_SITE_OTHER): Admitting: Vascular Surgery

## 2024-12-17 NOTE — Progress Notes (Unsigned)
 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 (1966-06-12) female who presents with chief complaint of check carotid arteries.  History of Present Illness:   The patient is seen for follow up evaluation of carotid stenosis status post CT angiogram. CT scan was done 05/28/2024.  Patient reports that the test went well with no problems or complications.   The patient denies interval amaurosis fugax. There is no recent or interval TIA symptoms or focal motor deficits. There is no prior documented CVA.  The patient is taking enteric-coated aspirin  81 mg daily.  There is no history of migraine headaches. There is no history of seizures.  No recent shortening of the patient's walking distance or new symptoms consistent with claudication.  No history of rest pain symptoms. No new ulcers or wounds of the lower extremities have occurred.  There is no history of DVT, PE or superficial thrombophlebitis. No recent episodes of angina or shortness of breath documented.   CT angiogram is reviewed by me personally and shows 1-39% stenosis consistent with calcified plaque at the origin of the bilateral internal carotid artery.   Active Medications[1]  Past Medical History:  Diagnosis Date   ADHD    Allergy 12/30/2018   Seasonal   Anemia    ANXIETY 06/06/2007   Arthritis    related to lupus   Asthma    BACK PAIN 02/22/2009   Breast cancer (HCC)    Cervical cancer (HCC)    LEEP 2005    Chicken pox    Chronic pain    goes to pain management provider   Collagen vascular disease    Connective tissue disease    DEPRESSION 06/06/2007   Essential tremor    Fibromyalgia    Fibromyalgia    FREQUENCY, URINARY 07/07/2007   Gastritis    GERD 06/06/2007   Glaucoma    Headache    h/o migraines    History of hiatal hernia    History of kidney stones    HYPOTHYROIDISM 06/06/2007   INTERSTITIAL CYSTITIS 09/11/2010   Lupus 2006   Lupus    Menopause     Migraine    Mitral valve regurgitation    NEPHROLITHIASIS, HX OF 11/05/2010   Neuropathy    Neuropathy    OCD (obsessive compulsive disorder)    Optic neuritis    2005   OSTEOPENIA 10/14/2009   Osteoporosis    OVERACTIVE BLADDER 10/14/2009   PAIN, CHRONIC NEC 06/06/2007   Panniculitis    Parkinson disease (HCC) 04/03/2019   Parkinson disease (HCC)    POLYARTHRITIS 08/19/2007   Raynaud's disease    Sicca syndrome    Ulcer 11/30/2017   Dont actually know date   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 Connell Encompass    Vasculitis    WEIGHT GAIN 02/22/2009    Past Surgical History:  Procedure Laterality Date   ADENOIDECTOMY     APPENDECTOMY     CERVICAL BIOPSY  W/ LOOP ELECTRODE EXCISION  2005   CHOLECYSTECTOMY     COLONOSCOPY N/A 10/16/2022   Procedure: COLONOSCOPY;  Surgeon: Maryruth Ole DASEN, MD;  Location: [facility] ENDOSCOPY;  Service: Gastroenterology;  Laterality: N/A;   ESOPHAGOGASTRODUODENOSCOPY N/A 10/16/2022   Procedure: ESOPHAGOGASTRODUODENOSCOPY (EGD);  Surgeon: Maryruth Ole DASEN, MD;  Location: [facility] ENDOSCOPY;  Service: Gastroenterology;  Laterality: N/A;   ESOPHAGOGASTRODUODENOSCOPY (EGD) WITH PROPOFOL  N/A 08/27/2017   Procedure: ESOPHAGOGASTRODUODENOSCOPY (EGD) WITH PROPOFOL ;  Surgeon: Viktoria Lamar DASEN, MD;  Location: [facility] ENDOSCOPY;  Service: Endoscopy;  Laterality: N/A;   FRACTURE SURGERY  11/30/2008   HAND SURGERY     repair of left metatarsal    HARDWARE REMOVAL Right 02/16/2017   Procedure: HARDWARE REMOVAL FROM HIP;  Surgeon: Ozell Flake, MD;  Location: [facility] ORS;  Service: Orthopedics;  Laterality: Right;   JOINT REPLACEMENT     OTHER SURGICAL HISTORY     left 3rd metatarsal fracture repair 2011    REVISION TOTAL HIP ARTHROPLASTY  06/2009   replacement 2010 then screw and plate removal 7981; right hip   TONSILLECTOMY     TONSILLECTOMY     wrist  ligament repair bilateral     WRIST RECONSTRUCTION     WRIST SURGERY      laceration of right wrist ligaments   wrist surgery     laceration of left wrist surgery     Social History Social History[2]  Family History Family History  Problem Relation Age of Onset   Hypertension Mother    Arthritis Mother    Heart disease Mother        afib   Asthma Mother    Asthma Sister    Asthma Daughter    Arthritis Daughter    Heart disease Daughter        ?heart condition on BB   ADD / ADHD Daughter    Depression Daughter    Intellectual disability Daughter    Learning disabilities Daughter    Pancreatic cancer Father    Cancer Father        pancreatitic    Diabetes Maternal Grandmother    Heart disease Maternal Grandmother    Arthritis Maternal Grandmother    Hypertension Maternal Grandmother    Dementia Maternal Grandmother    Colon cancer Maternal Uncle 45   Crohn's disease Maternal Aunt    Breast cancer Maternal Aunt 65   Diabetes Maternal Aunt    Breast cancer Cousin 50       maternal   Cancer Cousin        m cousin breat cancer s/p removal both breasts     Allergies[3]   REVIEW OF SYSTEMS (Negative unless checked)  Constitutional: [] Weight loss  [] Fever  [] Chills Cardiac: [] Chest pain   [] Chest pressure   [] Palpitations   [] Shortness of breath when laying flat   [] Shortness of breath with exertion. Vascular:  [x] Pain in legs with walking   [] Pain in legs at rest  [] History of DVT   [] Phlebitis   [] Swelling in legs   [] Varicose veins   [] Non-healing ulcers Pulmonary:   [] Uses home oxygen   [] Productive cough   [] Hemoptysis   [] Wheeze  [] COPD   [x] Asthma Neurologic:  [] Dizziness   [] Seizures   [] History of stroke   [] History of TIA  [] Aphasia   [] Vissual changes   [] Weakness or numbness in arm   [] Weakness or numbness in leg Musculoskeletal:   [] Joint swelling   [] Joint pain   [] Low back pain Hematologic:  [] Easy bruising  [] Easy bleeding   [] Hypercoagulable state   [] Anemic Gastrointestinal:  [] Diarrhea   [] Vomiting  [x] Gastroesophageal  reflux/heartburn   [] Difficulty swallowing. Genitourinary:  [] Chronic kidney disease   [] Difficult urination  [] Frequent urination   [] Blood in urine Skin:  [] Rashes   [] Ulcers  Psychological:  [x] History of anxiety   [x]  History of major depression.  Physical Examination  There were no vitals filed for this visit. There is no height or weight on file to calculate BMI. Gen: WD/WN, NAD Head: [place]/AT, No temporalis wasting.  Ear/Nose/Throat: Hearing grossly intact, nares w/o erythema or drainage Eyes: PER, EOMI, sclera nonicteric.  Neck: Supple, no masses.  No bruit or JVD.  Pulmonary:  Good air movement, no audible wheezing, no use of accessory muscles.  Cardiac: RRR, normal S1, S2, no Murmurs. Vascular:  carotid bruit noted Vessel Right Left  Radial Palpable Palpable  Carotid  Palpable  Palpable  Subclav  Palpable Palpable  Gastrointestinal: soft, non-distended. No guarding/no peritoneal signs.  Musculoskeletal: M/S 5/5 throughout.  No visible deformity.  Neurologic: CN 2-12 intact. Pain and light touch intact in extremities.  Symmetrical.  Speech is fluent. Motor exam as listed above. Psychiatric: Judgment intact, Mood & affect appropriate for pt's clinical situation. Dermatologic: No rashes or ulcers noted.  No changes consistent with cellulitis.   CBC Lab Results  Component Value Date   WBC 5.9 02/20/2024   HGB 14.0 02/20/2024   HCT 42.2 02/20/2024   MCV 84.9 02/20/2024   PLT 167 02/20/2024    BMET    Component Value Date/Time   NA 141 02/20/2024 1430   NA 141 09/02/2023 1443   NA 141 09/25/2014 0015   K 4.2 02/20/2024 1430   K 4.2 09/25/2014 0015   CL 107 02/20/2024 1430   CL 110 (H) 09/25/2014 0015   CO2 28 02/20/2024 1430   CO2 24 09/25/2014 0015   GLUCOSE 89 02/20/2024 1430   GLUCOSE 121 (H) 09/25/2014 0015   BUN 8 02/20/2024 1430   BUN 13 09/02/2023 1443   BUN 21 (H) 09/25/2014 0015   CREATININE 0.94 02/20/2024 1430   CREATININE 0.93 09/25/2014 0015    CALCIUM 9.1 02/20/2024 1430   CALCIUM 8.4 (L) 09/25/2014 0015   GFRNONAA >60 02/20/2024 1430   GFRNONAA >60 09/25/2014 0015   GFRNONAA >60 07/03/2014 0122   GFRAA >60 04/17/2020 0724   GFRAA >60 09/25/2014 0015   GFRAA >60 07/03/2014 0122   CrCl cannot be calculated (Patient's most recent lab result is older than the maximum 21 days allowed.).  COAG Lab Results  Component Value Date   INR 0.98 10/11/2017   INR 1.7 (H) 07/08/2009   INR 1.8 (H) 07/07/2009    Radiology No results found.   Assessment/Plan There are no diagnoses linked to this encounter.   Cordella Shawl, MD  12/17/2024 2:09 PM      [1]  No outpatient medications have been marked as taking for the 12/18/24 encounter (Appointment) with Shawl, Cordella MATSU, MD.  [2]  Social History Tobacco Use   Smoking status: Never   Smokeless tobacco: Never  Vaping Use   Vaping status: Never Used  Substance Use Topics   Alcohol  use: No   Drug use: No  [3]  Allergies Allergen Reactions   Bee Venom Anaphylaxis, Itching and Swelling    Affected area Affected area Affected area Affected area    Fish Allergy Hives, Swelling and Rash    Facial swelling  Facial swelling  Facial swelling Facial swelling  Facial swelling    Latex Anaphylaxis, Rash and Shortness Of Breath    Rash  Rash     Peanut (Diagnostic) Hives   Peanut-Containing Drug Products Anaphylaxis   Prasterone Other (See Comments) and Nausea And Vomiting    rash Other reaction(s): Headache Headaches.   Shellfish Allergy Hives, Other (See Comments), Rash and Swelling  Facial swelling Uncoded Allergy. Allergen: seafood Uncoded Allergy. Allergen: CATS, Other Reaction: itch, wheezing Uncoded Allergy. Allergen: COMPAZINE, Other Reaction: tremors Facial swelling Facial swelling Uncoded Allergy. Allergen: seafood Uncoded Allergy. Allergen: CATS, Other Reaction: itch, wheezing Uncoded Allergy. Allergen: COMPAZINE, Other Reaction:  tremors Facial swelling Uncoded Allergy. Allergen: seafood Uncoded Allergy. Allergen: CATS, Other Reaction: itch, wheezing Uncoded Allergy. Allergen: COMPAZINE, Other Reaction: tremors Facial swelling Facial swelling Uncoded Allergy. Allergen: seafood Uncoded Allergy. Allergen: CATS, Other Reaction: itch, wheezing Uncoded Allergy. Allergen: COMPAZINE, Other Reaction: tremors Facial swelling Facial swelling Uncoded Allergy. Allergen: seafood Uncoded Allergy. Allergen: CATS, Other Reaction: itch, wheezing Uncoded Allergy. Allergen: COMPAZINE, Other Reaction: tremors    Shellfish Protein-Containing Drug Products Hives, Other (See Comments), Rash and Swelling    Facial swelling Uncoded Allergy. Allergen: seafood Uncoded Allergy. Allergen: CATS, Other Reaction: itch, wheezing Uncoded Allergy. Allergen: COMPAZINE, Other Reaction: tremors Facial swelling Facial swelling Uncoded Allergy. Allergen: seafood Uncoded Allergy. Allergen: CATS, Other Reaction: itch, wheezing Uncoded Allergy. Allergen: COMPAZINE, Other Reaction: tremors Facial swelling Uncoded Allergy. Allergen: seafood Uncoded Allergy. Allergen: CATS, Other Reaction: itch, wheezing Uncoded Allergy. Allergen: COMPAZINE, Other Reaction: tremors Facial swelling   Compazine [Prochlorperazine Edisylate] Other (See Comments)    tremors   Dhea [Nutritional Supplements] Other (See Comments)    Headaches.   Dilaudid  [Hydromorphone  Hcl]     ? reaction   Famotidine  Other (See Comments) and Diarrhea    Other reaction(s): Headache Gas   Gabapentin  Other (See Comments)    'snowed out'    Lactose    Lactose Intolerance (Gi)    Nutritional Supplements Other (See Comments)    Other reaction(s): Other (See Comments), Unknown Headaches.    Headaches.   Other Other (See Comments)    Other reaction(s): Unknown    Oxycodone  Hives, Itching and Nausea Only   Prochlorperazine     Other reaction(s): Other (See Comments) ticks    Promethazine     Other reaction(s): Other (See Comments) Ticks   Promethazine Hcl Other (See Comments)    CNS disorder   Reclast  [Zoledronic  Acid]     Weakness could not move limbs, fatigue, increase sleep   Clarithromycin Hives and Rash   Clotrimazole Swelling and Rash   Hydromorphone  Hcl Rash and Hives    Rash all over   Penicillins Rash and Other (See Comments)    Has patient had a PCN reaction causing immediate rash, facial/tongue/throat swelling, SOB or lightheadedness with hypotension:No Has patient had a PCN reaction causing severe rash involving mucus membranes or skin necrosis:No Has patient had a PCN reaction that required hospitalization:No Has patient had a PCN reaction occurring within the last 10 years:No If all of the above answers are NO, then may proceed with Cephalosporin use.   "

## 2024-12-18 ENCOUNTER — Ambulatory Visit (INDEPENDENT_AMBULATORY_CARE_PROVIDER_SITE_OTHER): Admitting: Vascular Surgery

## 2024-12-18 DIAGNOSIS — I872 Venous insufficiency (chronic) (peripheral): Secondary | ICD-10-CM

## 2024-12-18 DIAGNOSIS — K219 Gastro-esophageal reflux disease without esophagitis: Secondary | ICD-10-CM

## 2024-12-18 DIAGNOSIS — I6523 Occlusion and stenosis of bilateral carotid arteries: Secondary | ICD-10-CM

## 2024-12-18 DIAGNOSIS — J4540 Moderate persistent asthma, uncomplicated: Secondary | ICD-10-CM
